# Patient Record
Sex: Female | Born: 1935 | Race: White | Hispanic: No | Marital: Married | Smoking: Never smoker
Health system: Southern US, Community
[De-identification: ages and names within clinical notes are randomized; demographics above are authoritative.]

## PROBLEM LIST (undated history)

## (undated) DIAGNOSIS — E785 Hyperlipidemia, unspecified: Secondary | ICD-10-CM

## (undated) DIAGNOSIS — J189 Pneumonia, unspecified organism: Secondary | ICD-10-CM

## (undated) DIAGNOSIS — I4891 Unspecified atrial fibrillation: Secondary | ICD-10-CM

## (undated) DIAGNOSIS — I350 Nonrheumatic aortic (valve) stenosis: Secondary | ICD-10-CM

## (undated) DIAGNOSIS — K7460 Unspecified cirrhosis of liver: Secondary | ICD-10-CM

## (undated) DIAGNOSIS — C50919 Malignant neoplasm of unspecified site of unspecified female breast: Secondary | ICD-10-CM

## (undated) DIAGNOSIS — C801 Malignant (primary) neoplasm, unspecified: Secondary | ICD-10-CM

## (undated) DIAGNOSIS — D649 Anemia, unspecified: Secondary | ICD-10-CM

## (undated) DIAGNOSIS — K219 Gastro-esophageal reflux disease without esophagitis: Secondary | ICD-10-CM

## (undated) DIAGNOSIS — I10 Essential (primary) hypertension: Secondary | ICD-10-CM

## (undated) DIAGNOSIS — E041 Nontoxic single thyroid nodule: Secondary | ICD-10-CM

## (undated) DIAGNOSIS — N1832 Chronic kidney disease, stage 3b: Secondary | ICD-10-CM

## (undated) DIAGNOSIS — R188 Other ascites: Secondary | ICD-10-CM

## (undated) HISTORY — DX: Unspecified cirrhosis of liver: K74.60

## (undated) HISTORY — PX: COLONOSCOPY: SHX174

## (undated) HISTORY — PX: TUBAL LIGATION: SHX77

## (undated) HISTORY — PX: JOINT REPLACEMENT: SHX530

---

## 2009-05-09 ENCOUNTER — Ambulatory Visit (HOSPITAL_COMMUNITY): Admission: RE | Admit: 2009-05-09 | Discharge: 2009-05-09 | Payer: Self-pay | Admitting: Preventative Medicine

## 2010-10-08 ENCOUNTER — Ambulatory Visit (HOSPITAL_COMMUNITY)
Admission: RE | Admit: 2010-10-08 | Discharge: 2010-10-08 | Payer: Self-pay | Source: Home / Self Care | Attending: Internal Medicine | Admitting: Internal Medicine

## 2010-10-08 ENCOUNTER — Encounter: Payer: Self-pay | Admitting: Orthopedic Surgery

## 2010-10-10 ENCOUNTER — Ambulatory Visit: Payer: Self-pay | Admitting: Orthopedic Surgery

## 2010-10-10 DIAGNOSIS — IMO0002 Reserved for concepts with insufficient information to code with codable children: Secondary | ICD-10-CM | POA: Insufficient documentation

## 2010-10-10 DIAGNOSIS — M1710 Unilateral primary osteoarthritis, unspecified knee: Secondary | ICD-10-CM

## 2010-10-10 DIAGNOSIS — S83289A Other tear of lateral meniscus, current injury, unspecified knee, initial encounter: Secondary | ICD-10-CM | POA: Insufficient documentation

## 2010-10-12 ENCOUNTER — Encounter (INDEPENDENT_AMBULATORY_CARE_PROVIDER_SITE_OTHER): Payer: Self-pay | Admitting: *Deleted

## 2010-10-16 ENCOUNTER — Ambulatory Visit (HOSPITAL_COMMUNITY)
Admission: RE | Admit: 2010-10-16 | Discharge: 2010-10-16 | Payer: Self-pay | Source: Home / Self Care | Attending: Orthopedic Surgery | Admitting: Orthopedic Surgery

## 2010-10-31 ENCOUNTER — Ambulatory Visit
Admission: RE | Admit: 2010-10-31 | Discharge: 2010-10-31 | Payer: Self-pay | Source: Home / Self Care | Attending: Orthopedic Surgery | Admitting: Orthopedic Surgery

## 2010-10-31 ENCOUNTER — Encounter: Payer: Self-pay | Admitting: Orthopedic Surgery

## 2010-11-01 ENCOUNTER — Encounter: Payer: Self-pay | Admitting: Orthopedic Surgery

## 2010-11-05 ENCOUNTER — Telehealth: Payer: Self-pay | Admitting: Orthopedic Surgery

## 2010-11-09 ENCOUNTER — Ambulatory Visit (HOSPITAL_COMMUNITY): Admission: RE | Admit: 2010-11-09 | Payer: Self-pay | Source: Home / Self Care | Admitting: Orthopedic Surgery

## 2010-11-12 ENCOUNTER — Ambulatory Visit: Admit: 2010-11-12 | Payer: Self-pay | Admitting: Orthopedic Surgery

## 2010-11-21 ENCOUNTER — Telehealth: Payer: Self-pay | Admitting: Orthopedic Surgery

## 2010-11-23 ENCOUNTER — Encounter: Payer: Self-pay | Admitting: Orthopedic Surgery

## 2010-11-29 NOTE — Miscellaneous (Signed)
Summary: mri left knee aph 10/16/10 2:30, fu 10/31/10 11am  Clinical Lists Changes   no precert needed for medicare/uhc, pt aware of appts, to bring disc

## 2010-11-29 NOTE — Assessment & Plan Note (Signed)
Summary: [name]/lt knee pain/going for xrays/[employer]/frs   Vital Signs:  Patient profile:   77 year old female Height:      65 inches Weight:      165 pounds Pulse rate:   68 / minute Resp:     16 per minute  Vitals Entered By: Fuller Canada MD (October 10, 2010 2:32 PM)  Visit Type:  new patient Referring Provider:  Dr. Dwana Melena Primary Provider:  Dr. Dwana Melena  CC:  left knee pain.  History of Present Illness: I saw [name] in the office today for an initial visit.  She is a 77 years old woman with the complaint of:  left knee pain.  Xrays left knee APh 10/08/10.  Meds: Losartan and Pravastatin.   This is a 77 year old female, who was in her usual state of health, Saturday, taking a shower, when she got out of the shower and put her LEFT leg on the floor. She couldn't bear weight. Complaint of sharp 9/10 pain of sudden onset associated with swelling of the LEFT knee. Her symptoms have not been relieved with diclofenac 50 mg once or twice a day. Tylenol has not helped as well. She denies any catching, numbness. Primarily, complains of diffuse knee pain, along the joint lines anteriorly and posteriorly with weightbearing  Allergies (verified): 1)  ! Penicillin  Past History:  Past Medical History: htn high cholesterol  Past Surgical History: na  Family History: Family History of Diabetes Family History of Arthritis  Social History: Patient is married.  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 Cardiovascular:  Denies chest pain, palpitations, fainting, and murmurs. Respiratory:  Denies short of breath, wheezing, couch, tightness, pain on inspiration, and snoring . Gastrointestinal:  Denies heartburn, nausea, vomiting, diarrhea, constipation, and blood in your stools. Genitourinary:  Denies frequency, urgency, difficulty  urinating, painful urination, flank pain, and bleeding in urine. Neurologic:  Denies numbness, tingling, unsteady gait, dizziness, tremors, and seizure. Musculoskeletal:  Denies joint pain, swelling, instability, stiffness, redness, heat, and muscle pain. Endocrine:  Denies excessive thirst, exessive urination, and heat or cold intolerance. Psychiatric:  Denies nervousness, depression, anxiety, and hallucinations. Skin:  Denies changes in the skin, poor healing, rash, itching, and redness. HEENT:  Denies blurred or double vision, eye pain, redness, and watering. Immunology:  Denies seasonal allergies, sinus problems, and allergic to bee stings. Hemoatologic:  Denies easy bleeding and brusing.  Physical Exam  Additional Exam:  GEN: well developed, well nourished, normal grooming and hygiene, no deformity and normal body habitus.   CDV: pulses are normal, no edema, no erythema. no tenderness  Lymph: normal lymph nodes   Skin: no rashes, skin lesions or open sores   NEURO: normal coordination, reflexes, sensation.   Psyche: awake, alert and oriented. Mood normal   Gait:she essentially has come contralateral chair.  Chest valgus, bilateral knees.  Her RIGHT knee still flexes 125 motor exam normal. Knee is stable. No tenderness mild swelling.  LEFT knee pain with extension and mild flexion contracture tenderness in the lateral joint line. Small joint effusion. Range of motion 120 knee is stable. I really couldn't do a McMurray sign on her because of pain. Ligaments were normal.    The upper extremities have normal appearance, ROM, strength and stability.    Impression & Recommendations:  Problem # 1:  TEAR LATERAL MENISCUS (ICD-836.1) Assessment New  Orders: New Patient Level IV (75643)  Problem # 2:  KNEE, ARTHRITIS, DEGEN./OSTEO (ICD-715.96) Assessment: New  Difficult case to assess. X-rays were reviewed from the hospital. No acute injury just valgus  osteoarthritis.  Possibilities include locked knee from torn meniscus, acute. Arthritis exacerbation with synovitis, swelling, and inflammation.  Recommend MRI to assess lateral meniscal tear.  Patient placed in hinged knee brace.  Walker was ordered for weightbearing.  Orders: New Patient Level IV (32951)  Patient Instructions: 1)  BRACE  2)  WALKER  3)  MRI LEFT KNEE WILL RETURN AFTER STUDIES COMPLETED    Orders Added: 1)  New Patient Level IV [99204]  Appended Document: [name]/lt knee pain/going for xrays/[employer]/frs GEN: well developed, well nourished, normal grooming and hygiene, no deformity and normal body habitus.   CDV: pulses are normal, no edema, no erythema. no tenderness  Lymph: normal lymph nodes   Skin: no rashes, skin lesions or open sores   NEURO: normal coordination, reflexes, sensation.   Psyche: awake, alert and oriented. Mood normal   Gait:she essentially has come contralateral chair.  Chest valgus, bilateral knees.  Her RIGHT knee still flexes 125 motor exam normal. Knee is stable. No tenderness mild swelling.  LEFT knee pain with extension and mild flexion contracture tenderness in the lateral joint line. Small joint effusion. Range of motion 120 knee is stable. I really couldn't do a McMurray sign on her because of pain. Ligaments were normal.    The upper extremities have normal appearance, ROM, strength and stability.

## 2010-11-29 NOTE — Letter (Signed)
Summary: Surgery order LT knee sched 11/09/10  Surgery order LT knee sched 11/09/10   Imported By: Cammie Sickle 11/02/2010 09:21:25  _____________________________________________________________________  External Attachment:    Type:   Image     Comment:   External Document

## 2010-11-29 NOTE — Assessment & Plan Note (Signed)
Summary: left knee mri results aph/to bring disc.cbt   Visit Type:  Follow-up Referring Provider:  Dr. Dwana Melena Primary Provider:  Dr. Dwana Melena  CC:  mri results left knee.  History of Present Illness: I saw [name] in the office today for a visit.  She is a 77 years old woman with the complaint of:  left knee pain.  Xrays left knee APh 10/08/10.  Meds: Losartan and Pravastatin.   DX: KNEE SPRAIN   Today is MRI results of the left knee taken [facility] 10/16/10.  Placed in economy hinge knee brace and advised to use walker last visit, doing alot better.  No injections.  IMPRESSION:   1.  Advanced lateral compartment degenerative changes with extensive degenerative tear of the anterior horn and body of the lateral meniscus. 2.  Milder medial and patellofemoral degenerative changes.  No acute osteochondral findings. 3.  Intact medial meniscus, collateral and cruciate ligaments.  we discussed the following total knee replacement versus arthroscopy versus continued nonoperative treatment. Her husband was present. She indicates that she cannot function the way her knee is at this time. She is having pain when she is ambulating although not severe. I told he leaning against knee replacement, although I thought it would be necessary in the future.    Allergies: 1)  ! Penicillin   Impression & Recommendations:  Problem # 1:  KNEE, ARTHRITIS, DEGEN./OSTEO (ZOX-096.04)  Orders: Est. Patient Level IV (54098)  Problem # 2:  TEAR LATERAL MENISCUS (ICD-836.1)  Orders: Est. Patient Level IV (11914)  Patient Instructions: 1)  SCHEDULED FOR LEFT KNEE SURGERY ARTHROSCOPY AND LATERAL MENISECTOMY 2)  DOS 11/09/10 3)  Preop is 11/05/10 at 12:30, take packet with you. 4)  Stop taking Aspirin Friday 11/02/10. 5)  Post op 1 in our office 11/12/09.   Orders Added: 1)  Est. Patient Level IV [78295]  Appended Document: left knee mri results aph/to bring disc.cbt Allergies (verified): 1)   ! Penicillin  Past History:  Past Medical History: htn high cholesterol  Past Surgical History: na  Family History: Family History of Diabetes Family History of Arthritis  Social History: Patient is married.  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 Cardiovascular:  Denies chest pain, palpitations, fainting, and murmurs. Respiratory:  Denies short of breath, wheezing, couch, tightness, pain on inspiration, and snoring . Gastrointestinal:  Denies heartburn, nausea, vomiting, diarrhea, constipation, and blood in your stools. Genitourinary:  Denies frequency, urgency, difficulty urinating, painful urination, flank pain, and bleeding in urine. Neurologic:  Denies numbness, tingling, unsteady gait, dizziness, tremors, and seizure. Musculoskeletal:  Denies joint pain, swelling, instability, stiffness, redness, heat, and muscle pain. Endocrine:  Denies excessive thirst, exessive urination, and heat or cold intolerance. Psychiatric:  Denies nervousness, depression, anxiety, and hallucinations. Skin:  Denies changes in the skin, poor healing, rash, itching, and redness. HEENT:  Denies blurred or double vision, eye pain, redness, and watering. Immunology:  Denies seasonal allergies, sinus problems, and allergic to bee stings. Hemoatologic:  Denies easy bleeding and brusing.  Appended Document: left knee mri results aph/to bring disc.cbt GEN: well developed, well nourished, normal grooming and hygiene, no deformity and normal body habitus.   CDV: pulses are normal, no edema, no erythema. no tenderness  Lymph: normal lymph nodes   Skin: no rashes, skin lesions or open sores   NEURO: normal coordination, reflexes, sensation.  Psyche: awake, alert and oriented. Mood normal   Gait:she essentially has come contralateral chair.  Chest valgus,  bilateral knees.  Her RIGHT knee still flexes 125 motor exam normal. Knee is stable. No tenderness mild swelling.  LEFT knee pain with extension and mild flexion contracture tenderness in the lateral joint line. Small joint effusion. Range of motion 120 knee is stable. I really couldn't do a McMurray sign on her because of pain. Ligaments were normal.    The upper extremities have normal appearance, ROM, strength and stability.

## 2010-11-29 NOTE — Progress Notes (Signed)
Summary: wants to cancel surgery  Phone Note Other Incoming   Summary of Call: [name] called for [name] (January 20, 2030).  Said she wanted to cancel her surgery that is scheduled for 11/09/10.  I will call [name] to cancel her preop appointment.  [name]'s phone # is [phone]  Initial call taken by: Jacklynn Ganong,  November 05, 2010 8:29 AM  Follow-up for Phone Call        called kim and left message to cx surgery and preop  Follow-up by: Ether Griffins,  November 05, 2010 8:38 AM

## 2010-11-29 NOTE — Letter (Signed)
Summary: Physician's order for a cane  Physician's order for a cane   Imported By: Jacklynn Ganong 11/01/2010 15:42:02  _____________________________________________________________________  External Attachment:    Type:   Image     Comment:   External Document

## 2010-11-29 NOTE — Progress Notes (Signed)
Summary: call from patient, requesting records  Phone Note Call from Patient   Caller: Patient Summary of Call: Patient called to relay that she is to have a 2nd opinion regarding her LT knee. Requested records to be faxed to Dr Claudie Leach.  Advised to come in to sign authorization release of information form and notes can be faxed with signed release. Initial call taken by: Cammie Sickle,  November 21, 2010 2:12 PM

## 2010-11-29 NOTE — Letter (Signed)
Summary: History form  History form   Imported By: Jacklynn Ganong 10/15/2010 13:58:02  _____________________________________________________________________  External Attachment:    Type:   Image     Comment:   External Document

## 2010-11-29 NOTE — Letter (Signed)
Summary: Medical records faxed pt request [facility]  Medical records faxed pt request [facility]   Imported By: Cammie Sickle 11/23/2010 21:11:50  _____________________________________________________________________  External Attachment:    Type:   Image     Comment:   External Document

## 2011-06-10 ENCOUNTER — Ambulatory Visit (HOSPITAL_COMMUNITY)
Admission: RE | Admit: 2011-06-10 | Discharge: 2011-06-10 | Disposition: A | Discharge status: Home / Self Care | Payer: Medicare Other | Source: Ambulatory Visit | Attending: Orthopedic Surgery | Admitting: Orthopedic Surgery

## 2011-06-10 ENCOUNTER — Other Ambulatory Visit: Payer: Self-pay | Admitting: Orthopedic Surgery

## 2011-06-10 ENCOUNTER — Encounter (HOSPITAL_COMMUNITY): Payer: Medicare Other

## 2011-06-10 ENCOUNTER — Other Ambulatory Visit (HOSPITAL_COMMUNITY): Payer: Self-pay | Admitting: Orthopedic Surgery

## 2011-06-10 DIAGNOSIS — Z01810 Encounter for preprocedural cardiovascular examination: Secondary | ICD-10-CM | POA: Insufficient documentation

## 2011-06-10 DIAGNOSIS — Z01818 Encounter for other preprocedural examination: Secondary | ICD-10-CM | POA: Insufficient documentation

## 2011-06-10 DIAGNOSIS — I10 Essential (primary) hypertension: Secondary | ICD-10-CM | POA: Insufficient documentation

## 2011-06-10 DIAGNOSIS — M1710 Unilateral primary osteoarthritis, unspecified knee: Secondary | ICD-10-CM

## 2011-06-10 DIAGNOSIS — M179 Osteoarthritis of knee, unspecified: Secondary | ICD-10-CM

## 2011-06-10 DIAGNOSIS — Z01812 Encounter for preprocedural laboratory examination: Secondary | ICD-10-CM | POA: Insufficient documentation

## 2011-06-10 LAB — URINALYSIS, ROUTINE W REFLEX MICROSCOPIC
Glucose, UA: NEGATIVE mg/dL
Protein, ur: NEGATIVE mg/dL
pH: 6.5 (ref 5.0–8.0)

## 2011-06-10 LAB — COMPREHENSIVE METABOLIC PANEL
ALT: 17 U/L (ref 0–35)
AST: 18 U/L (ref 0–37)
Albumin: 4.1 g/dL (ref 3.5–5.2)
Alkaline Phosphatase: 85 U/L (ref 39–117)
Chloride: 97 mEq/L (ref 96–112)
Potassium: 4.2 mEq/L (ref 3.5–5.1)
Sodium: 133 mEq/L — ABNORMAL LOW (ref 135–145)
Total Bilirubin: 0.3 mg/dL (ref 0.3–1.2)
Total Protein: 8 g/dL (ref 6.0–8.3)

## 2011-06-10 LAB — CBC
MCHC: 33.8 g/dL (ref 30.0–36.0)
Platelets: 368 10*3/uL (ref 150–400)
RDW: 14.3 % (ref 11.5–15.5)
WBC: 8.4 10*3/uL (ref 4.0–10.5)

## 2011-06-10 LAB — SURGICAL PCR SCREEN
MRSA, PCR: NEGATIVE
Staphylococcus aureus: POSITIVE — AB

## 2011-06-10 LAB — PROTIME-INR: Prothrombin Time: 13.5 seconds (ref 11.6–15.2)

## 2011-06-10 LAB — URINE MICROSCOPIC-ADD ON

## 2011-06-21 ENCOUNTER — Inpatient Hospital Stay (HOSPITAL_COMMUNITY)
Admission: RE | Admit: 2011-06-21 | Discharge: 2011-06-24 | DRG: 470 | Disposition: A | Discharge status: Home Health | Payer: Medicare Other | Source: Ambulatory Visit | Attending: Orthopedic Surgery | Admitting: Orthopedic Surgery

## 2011-06-21 DIAGNOSIS — M21869 Other specified acquired deformities of unspecified lower leg: Secondary | ICD-10-CM | POA: Diagnosis present

## 2011-06-21 DIAGNOSIS — Z01812 Encounter for preprocedural laboratory examination: Secondary | ICD-10-CM

## 2011-06-21 DIAGNOSIS — I10 Essential (primary) hypertension: Secondary | ICD-10-CM | POA: Diagnosis present

## 2011-06-21 DIAGNOSIS — E871 Hypo-osmolality and hyponatremia: Secondary | ICD-10-CM | POA: Diagnosis not present

## 2011-06-21 DIAGNOSIS — M1710 Unilateral primary osteoarthritis, unspecified knee: Principal | ICD-10-CM | POA: Diagnosis present

## 2011-06-21 DIAGNOSIS — E7800 Pure hypercholesterolemia, unspecified: Secondary | ICD-10-CM | POA: Diagnosis present

## 2011-06-21 LAB — TYPE AND SCREEN
ABO/RH(D): O POS
Antibody Screen: NEGATIVE

## 2011-06-21 LAB — ABO/RH: ABO/RH(D): O POS

## 2011-06-22 LAB — BASIC METABOLIC PANEL
Calcium: 8.5 mg/dL (ref 8.4–10.5)
GFR calc Af Amer: >60 mL/min (ref >60)
GFR calc non Af Amer: >60 mL/min (ref >60)
Glucose, Bld: 139 mg/dL — ABNORMAL HIGH (ref 70–99)
Sodium: 129 mEq/L — ABNORMAL LOW (ref 135–145)

## 2011-06-22 LAB — CBC
Hemoglobin: 9.4 g/dL — ABNORMAL LOW (ref 12.0–15.0)
MCH: 29.4 pg (ref 26.0–34.0)
MCHC: 33.8 g/dL (ref 30.0–36.0)
RDW: 14.1 % (ref 11.5–15.5)

## 2011-06-23 LAB — BASIC METABOLIC PANEL
Calcium: 8.7 mg/dL (ref 8.4–10.5)
GFR calc non Af Amer: >60 mL/min (ref >60)
Potassium: 3.8 mEq/L (ref 3.5–5.1)
Sodium: 126 mEq/L — ABNORMAL LOW (ref 135–145)

## 2011-06-23 LAB — CBC
MCH: 29.8 pg (ref 26.0–34.0)
MCHC: 34.7 g/dL (ref 30.0–36.0)
Platelets: 306 10*3/uL (ref 150–400)
RBC: 3.22 MIL/uL — ABNORMAL LOW (ref 3.87–5.11)

## 2011-06-24 LAB — CBC
HCT: 24.7 % — ABNORMAL LOW (ref 36.0–46.0)
Hemoglobin: 8.5 g/dL — ABNORMAL LOW (ref 12.0–15.0)
MCH: 29.5 pg (ref 26.0–34.0)
MCHC: 34.4 g/dL (ref 30.0–36.0)
MCV: 85.8 fL (ref 78.0–100.0)
Platelets: 307 10*3/uL (ref 150–400)
RBC: 2.88 MIL/uL — ABNORMAL LOW (ref 3.87–5.11)
RDW: 14.3 % (ref 11.5–15.5)
WBC: 8.9 10*3/uL (ref 4.0–10.5)

## 2011-06-24 LAB — BASIC METABOLIC PANEL
CO2: 26 mEq/L (ref 19–32)
Chloride: 94 mEq/L — ABNORMAL LOW (ref 96–112)
Potassium: 3.9 mEq/L (ref 3.5–5.1)
Sodium: 128 mEq/L — ABNORMAL LOW (ref 135–145)

## 2011-06-27 NOTE — Op Note (Signed)
[name], [name] NO.:  [id number]  MEDICAL RECORD NO.:  [id number]  LOCATION:  1601                         FACILITY:  [facility]  PHYSICIAN:  Ollen Gross, M.D.    DATE OF BIRTH:  [birth year]  DATE OF PROCEDURE:  06/21/2011 DATE OF DISCHARGE:                              OPERATIVE REPORT   PREOPERATIVE DIAGNOSIS:  Osteoarthritis left knee with valgus deformity.  POSTOPERATIVE DIAGNOSIS:  Osteoarthritis left knee with valgus deformity.  PROCEDURE:  Left total knee arthroplasty.  SURGEON:  Ollen Gross, M.D.  ASSISTANT:  Alexzandrew L. Perkins, P.A.C.  ANESTHESIA:  Spinal.  ESTIMATED BLOOD LOSS:  Minimal.  DRAIN:  Hemovac x1.  TOURNIQUET TIME:  28 minutes at 300 mmHg.  COMPLICATIONS:  None.  CONDITION:  Stable to recovery room.  BRIEF CLINICAL NOTE:  [name] is a 77 year old female with severe advanced end-stage arthritis of the left knee with about a 20 degrees valgus deformity.  She is bone-on-bone in lateral and patellofemoral compartments.  She has failed nonoperative management including injections.  She has intractable pain and dysfunction.  She presents now for left total knee arthroplasty.  PROCEDURE IN DETAIL:  After successful administration of spinal anesthetic, a tourniquet was placed on her left thigh and her left lower extremity, was prepped and draped in the usual sterile fashion.  The extremity was wrapped in Esmarch, knee flexed, tourniquet inflated to 300 mmHg.  Midline incision was made with #10 blade through subcutaneous tissue to the level of the extensor mechanism.  Fresh blade was used to make a lateral parapatellar arthrotomy.  We did the lateral approach due to her valgus deformity.  Soft tissue on the proximal lateral tibia was subperiosteally elevated around the joint line to the posterolateral corner, but not including the structures of the posterolateral corner. The patella was everted medially, knee flexed to 90  degrees, ACL and PCL removed.  Drill was used create a starting hole in the distal femur, the canal was thoroughly irrigated.  Five degrees left valgus alignment guide was placed.  Distal femoral cutting block was pinned to remove 11 mm off the distal femur.  The resection was made with an oscillating saw.  Sizing guide was placed, size 3 was the most appropriate. Rotation was marked of the epicondylar axis.  Size 3 cutting block was placed and the anterior-posterior chamfer cuts were made.  The tibia subluxed forward and menisci were removed.  Extramedullary tibial alignment guide was placed referencing proximally at the medial aspect of the tibial tubercle and distally along the second metatarsal axis of the tibial crest.  The block was pinned to remove 2 mm off the more deficient lateral side.  Size 3 was the most appropriate tibial component and proximal tibia was prepared to modular drill and keel punch for the size 3.  Femoral preparation was completed with intercondylar cut.  Size 3 posterior stabilized femoral trial was placed.  A 12.5 mm posterior stabilized rotating platform insert trial was placed.  With a 12.5, full extension was achieved with excellent varus-valgus and anterior-posterior balance throughout full range of motion.  The patella was everted and thickness measured to be 24 mm.  Freehand resection taken to 14 mm, 35 template was placed, lug holes drilled, trial patella was placed and it tracked normally.  Osteophytes were removed off the posterior femur with the trial in place.  All trial was removed and the cut bone surfaces prepared with pulsatile lavage. Cement was mixed and once ready for implantation, a size 3 mobile bearing tibial tray size 3 posterior stabilized femur and 35 patella were cemented in place.  Patella was held with the clamp.  Trial 12.5-mm insert was placed, knee held in full extension, all extruded cement removed.  When cement had fully  hardened, then the permanent 12.5-mm posterior stabilized rotating platform insert was placed in the tibial tray.  The wound was copiously irrigated with saline solution and tourniquet was released for a total time of 28 minutes.  Minor bleeding was identified and stopped with cautery.  The arthrotomy was then closed with #1 PDS leaving up a small area from the superior to inferior pole of the patella to serve as a mini release.  The subcu was then closed with interrupted 2-0 Vicryl.  Flexion against gravity was 140 degrees. The patella tracked normally.  The subcuticular was closed with running 4-0 Monocryl.  Catheter for Marcaine pain pump was placed and pumps initiated.  The incision was cleaned and dried and Steri-Strips and bulky sterile dressing were applied.  She was then placed into a knee immobilizer, awakened, and transported to recovery in stable condition.     Ollen Gross, M.D.     FA/MEDQ  D:  06/21/2011  T:  06/21/2011  Job:  [id number]  Electronically Signed by Ollen Gross M.D. on 06/27/2011 04:05:08 PM

## 2011-06-27 NOTE — H&P (Signed)
[name], Adrienne NO.:  [id number]  MEDICAL RECORD NO.:  [id number]  LOCATION:  1601                         FACILITY:  [facility]  PHYSICIAN:  Ollen Gross, M.D.    DATE OF BIRTH:  11-Jul-1930  DATE OF ADMISSION:  06/21/2011 DATE OF DISCHARGE:                             HISTORY & PHYSICAL   CHIEF COMPLAINT:  Left knee pain.  HISTORY OF PRESENT ILLNESS:  The patient is a 77 year old female who has been seen by Dr. Lequita Halt for ongoing left knee pain.  Her pain has been quite severe now where it is severely limiting her function.  She has been seen in the second opinion for the ongoing knee problems, it is limiting her activity.  She has been seen and found to have advanced end- stage arthritis of the left knee with bone-on-bone in the lateral compartment and the patellofemoral compartment.  She also has a large valgus deformity.  It is felt she would benefit from surgical intervention, risks and benefits have been discussed, she elected to proceed with surgery.  ALLERGIES:  PENICILLIN causes rash.  CURRENT MEDICATIONS:  Aloe tea, aspirin, vitamin D, fish oil, vitamin E, multivitamin, glucosamine and chondroitin, simvastatin, losartan.  PAST MEDICAL HISTORY: 1. Past history of pneumonia. 2. Hypertension. 3. Hypercholesterolemia.  PAST SURGICAL HISTORY:  Colonoscopy.  FAMILY HISTORY:  Father with a history of acute bronchitis.  Mother with heart attack.  SOCIAL HISTORY:  Married, nonsmoker, no alcohol.  Two children.  She prefers to go home following her surgery.  Husband will be assisting with care after surgery.  She has 4-1/2 steps going into the house.  REVIEW OF SYSTEMS:  GENERAL:  No fever, chills, or night sweats.  NEURO: No seizure, syncope, or paralysis.  RESPIRATORY:  No shortness of breath, productive cough, or hemoptysis.  CARDIOVASCULAR:  No chest pain, angina, orthopnea.  GI:  No nausea, vomiting, diarrhea, or constipation.  GU:  No  dysuria, hematuria, or discharge. MUSCULOSKELETAL:  Knee pain.  PHYSICAL EXAMINATION:  VITAL SIGNS:  Pulse 92, respirations 14, blood pressure 179/87. GENERAL:  A 77 year old white female, well nourished, well developed, in no acute distress.  She is alert, oriented, cooperative, pleasant. Accompanied by her husband.  Good historian. HEENT:  Normocephalic, atraumatic.  Pupils are round and reactive.  EOMs intact. NECK:  Supple. CHEST:  Clear. HEART:  Regular rate and rhythm with a grade 2/6 systolic ejection murmur, best heard over aortic, pulmonic, and Erb's point. ABDOMEN:  Soft, slightly round.  Bowel sounds present, protuberant. RECTAL/BREASTS/GENITALIA:  Not done, not pertinent to present illness. EXTREMITIES:  Left knee significant valgus malalignment deformity, range of motion 5-120, marked crepitus, tender more lateral than medial, no instability, does ambulate with antalgic gait.  IMPRESSION:  Osteoarthritis, left knee.  PLAN:  The patient admitted to [facility] to undergo a left total knee replacement arthroplasty.  Surgery will be performed by Dr. Ollen Gross.  She has no active contraindications such as ongoing infection or rapidly progressive neurological disease.     Alexzandrew L. Julien Girt, P.A.C.   ______________________________ Ollen Gross, M.D.    ALP/MEDQ  D:  06/23/2011  T:  06/24/2011  Job:  [id number]  cc:   Dr. Stephens Shire  Electronically Signed by Patrica Duel P.A.C. on 06/24/2011 04:09:18 PM Electronically Signed by Ollen Gross M.D. on 06/27/2011 04:05:11 PM

## 2011-07-17 NOTE — Discharge Summary (Signed)
[name], [name] NO.:  [id number]  MEDICAL RECORD NO.:  [id number]  LOCATION:  1601                         FACILITY:  [facility]  PHYSICIAN:  Ollen Gross, M.D.    DATE OF BIRTH:  February 22, 1930  DATE OF ADMISSION:  06/21/2011 DATE OF DISCHARGE:  06/24/2011                              DISCHARGE SUMMARY   ADMITTING DIAGNOSES: 1. Osteoarthritis, left knee. 2. Past history of pneumonia. 3. Hypertension. 4. Hypercholesterolemia.  DISCHARGE DIAGNOSES: 1. Osteoarthritis left knee with valgus deformity, status post left     total knee replacement arthroplasty. 2. Postop acute blood loss anemia. 3. Postop hyponatremia. 4. Past history of pneumonia. 5. Hypertension. 6. Hypercholesterolemia.  PROCEDURE:  June 21, 2011, left total knee.  Surgeon, Dr. Lequita Halt. Assistant, Patrica Duel, PA-C.  Spinal anesthesia.  Tourniquet time 28 minutes.  CONSULTS:  None.  BRIEF HISTORY:  The patient is a 77 year old female with severe end- stage arthritis of the left knee with about 20 degrees valgus malalignment deformity with bone on bone lateral patellofemoral compartments.  She has failed nonoperative management including injections.  Has intractable pain and dysfunction, now presents for total knee arthroplasty.  LABORATORY DATA:  Preop CBC is not scanned into the chart, not available; the hemoglobin dropped postop in the 9.4, got as low as 8.5; and 24.7.  Chem panel not scanned in and not available, but the sodium was low postop at 129, dropped down to 126, back up to 128 was improving at time of discharge.  Glucose was elevated on day #1 of 139, back down to 113.  Blood group type O positive.  HOSPITAL COURSE:  The patient was admitted to [facility], taken to OR, underwent above-stated procedure without complication.  The patient tolerated the procedure well, later transferred to recovery room and then to the orthopedic floor.  Started on p.o. and  IV analgesic for pain control.  Following surgery, given 24 hours postop IV antibiotics. Started on Xarelto for DVT prophylaxis.  Doing fairly well on the morning of day #1, was comfortable.  Discontinued the Hemovac and PCA. Started getting up with therapy.  On day #2, she was doing very well. Sodium was down to 126, so we stopped all the fluids and rechecked it. Incision was healing well, no signs of infection.  Continued to work with therapy and started meeting her goals and by day #3, she was doing well, sodium was back up a little bit to 128, progressing with therapy. Incision looked good.  Tolerating medications and discharged home.  DISCHARGE PLAN: 1. The patient was discharged home on June 24, 2011. 2. Discharge diagnoses, please see above. 3. Discharge medications, Vicodin, Robaxin, Xarelto.  Continue home     medications of cetirizine, losartan, and simvastatin. 4. Follow up in 2 weeks. 5. Activity, weightbearing as tolerated, total knee protocol.  DISPOSITION:  Home.  CONDITION UPON DISCHARGE:  Improved.     Alexzandrew L. Julien Girt, P.A.C.   ______________________________ Ollen Gross, M.D.    ALP/MEDQ  D:  07/11/2011  T:  07/11/2011  Job:  [id number]  cc:   Dr. Stephens Shire  Electronically Signed by Ollen Gross M.D. on  07/17/2011 10:07:44 AM

## 2011-09-10 ENCOUNTER — Other Ambulatory Visit: Payer: Self-pay | Admitting: Orthopedic Surgery

## 2011-11-18 ENCOUNTER — Other Ambulatory Visit: Payer: Self-pay | Admitting: Orthopedic Surgery

## 2011-12-02 ENCOUNTER — Encounter (HOSPITAL_COMMUNITY): Payer: Self-pay

## 2011-12-05 ENCOUNTER — Other Ambulatory Visit: Payer: Self-pay

## 2011-12-05 ENCOUNTER — Ambulatory Visit (HOSPITAL_COMMUNITY)
Admission: RE | Admit: 2011-12-05 | Discharge: 2011-12-05 | Disposition: A | Discharge status: Home / Self Care | Payer: Medicare Other | Source: Ambulatory Visit | Attending: Orthopedic Surgery | Admitting: Orthopedic Surgery

## 2011-12-05 ENCOUNTER — Encounter (HOSPITAL_COMMUNITY): Payer: Self-pay

## 2011-12-05 ENCOUNTER — Encounter (HOSPITAL_COMMUNITY)
Admission: RE | Admit: 2011-12-05 | Discharge: 2011-12-05 | Disposition: A | Discharge status: Home / Self Care | Payer: Medicare Other | Source: Ambulatory Visit | Attending: Orthopedic Surgery | Admitting: Orthopedic Surgery

## 2011-12-05 DIAGNOSIS — Z01810 Encounter for preprocedural cardiovascular examination: Secondary | ICD-10-CM | POA: Insufficient documentation

## 2011-12-05 DIAGNOSIS — I10 Essential (primary) hypertension: Secondary | ICD-10-CM | POA: Insufficient documentation

## 2011-12-05 DIAGNOSIS — Z01818 Encounter for other preprocedural examination: Secondary | ICD-10-CM | POA: Insufficient documentation

## 2011-12-05 DIAGNOSIS — Z01812 Encounter for preprocedural laboratory examination: Secondary | ICD-10-CM | POA: Insufficient documentation

## 2011-12-05 HISTORY — DX: Hyperlipidemia, unspecified: E78.5

## 2011-12-05 HISTORY — DX: Gastro-esophageal reflux disease without esophagitis: K21.9

## 2011-12-05 HISTORY — DX: Nontoxic single thyroid nodule: E04.1

## 2011-12-05 HISTORY — DX: Essential (primary) hypertension: I10

## 2011-12-05 HISTORY — DX: Pneumonia, unspecified organism: J18.9

## 2011-12-05 LAB — COMPREHENSIVE METABOLIC PANEL
BUN: 23 mg/dL (ref 6–23)
Calcium: 9.9 mg/dL (ref 8.4–10.5)
Creatinine, Ser: 0.92 mg/dL (ref 0.50–1.10)
GFR calc Af Amer: 67 mL/min — ABNORMAL LOW (ref >90)
Glucose, Bld: 94 mg/dL (ref 70–99)
Total Protein: 7.8 g/dL (ref 6.0–8.3)

## 2011-12-05 LAB — URINE MICROSCOPIC-ADD ON

## 2011-12-05 LAB — CBC
HCT: 35.1 % — ABNORMAL LOW (ref 36.0–46.0)
Hemoglobin: 11.8 g/dL — ABNORMAL LOW (ref 12.0–15.0)
MCH: 29 pg (ref 26.0–34.0)
MCHC: 33.6 g/dL (ref 30.0–36.0)

## 2011-12-05 LAB — PROTIME-INR
INR: 1.02 (ref 0.00–1.49)
Prothrombin Time: 13.6 seconds (ref 11.6–15.2)

## 2011-12-05 LAB — URINALYSIS, ROUTINE W REFLEX MICROSCOPIC
Protein, ur: NEGATIVE mg/dL
Urobilinogen, UA: 0.2 mg/dL (ref 0.0–1.0)

## 2011-12-05 LAB — SURGICAL PCR SCREEN: MRSA, PCR: NEGATIVE

## 2011-12-05 MED ORDER — CHLORHEXIDINE GLUCONATE 4 % EX LIQD
60.0000 mL | Freq: Once | CUTANEOUS | Status: DC
  Filled 2011-12-05: qty 60

## 2011-12-05 NOTE — Patient Instructions (Signed)
20 [name]  12/05/2011   Your procedure is scheduled on:  12/16/11   Surgery  [id number]    Monday  Report to [facility] at     0555  AM.  Call this number if you have problems the morning of surgery: [phone]     Or PST   [id number]  [facility]   Remember:   Do not eat food:After Midnight. Sunday night  May have clequids:Clear liquids until midnight Sunday night then none  Clear liquids include soda, tea, black coffee, apple or grape juice, broth.  Take these medicines the morning of surgery with A SIP OF WATER: Zyrtec if needed   Do not wear jewelry, make-up or nail polish.  Do not wear lotions, powders, or perfumes. You may wear deodorant.  Do not shave 48 hours prior to surgery.  Do not bring valuables to the hospital.  Contacts, dentures or bridgework may not be worn into surgery.  Leave suitcase in the car. After surgery it may be brought to your room.  For patients admitted to the hospital, checkout time is 11:00 AM the day of discharge.   Patients discharged the day of surgery will not be allowed to drive home.  Name and phone number of your driver:    RON                                                                 Special Instructions: CHG Shower Use Special Wash: 1/2 bottle night before surgery and 1/2 bottle morning of surgery. REOF GULAR SOAP FACE AND PRIVATES              LADIES- NO SHAVING 48 HOURS BEFORE USING BETASEPT SOAP.                 Read over the following fact sheets that you were given: MRSA        Blood Transfusion

## 2011-12-05 NOTE — Pre-Procedure Instructions (Signed)
Notified [name] at Dr Salina April of abnormal urine report [id number]

## 2011-12-05 NOTE — Pre-Procedure Instructions (Signed)
Faxed clarification of antibiotic order and allergies to Dr Aluisio-Received confirmation from him "ANCEF OK"

## 2011-12-13 ENCOUNTER — Other Ambulatory Visit: Payer: Self-pay | Admitting: Orthopedic Surgery

## 2011-12-13 NOTE — H&P (Signed)
[name]  DOB: 08-01-1930 Married / Language: English / Race: White / Female  Date of Admission:  12/16/2011  Chief Complaint:  Right Knee pain  History of Present Illness The patient is a 78 year old female who comes in today for a preoperative History and Physical. The patient is scheduled for a right total knee arthroplasty to be performed by Dr. Gus Rankin. Aluisio, MD at [facility] on 12/16/2011. 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 [name] feels that her left knee is doing fantastic. Right knee is the only thing that is bothering her badly now. She has significant valgus deformity in the right knee with bone on bone changes. She feels like the left knee has recovered beautifully and is ready to do something about the right knee. They have been treated conservatively in the past for the above stated problem and despite conservative measures, they continue to have progressive pain and severe functional limitations and dysfunction. They have failed non-operative management including home exercise, medications, and injections. It is felt that they would benefit from undergoing total joint replacement. Risks and benefits of the procedure have been discussed with the patient and they elect to proceed with surgery. There are no active contraindications to surgery such as ongoing infection or rapidly progressive neurological disease.  Problem List/Past Medical Hypercholesterolemia History of Pneumonia Hypertension   Allergies PENICILLIN - Rash.   Family History Father. Deceased. Age 77, History of Brochitis Mother. Deceased, Myocardial infarction. Age 27   Social History No alcohol use Tobacco use. Former smoker. never smoker Marital status. married Living situation. live with spouse Children. 2 Alcohol use. never consumed alcohol Post-Surgical Plans. Plan to go home. Advance Directives. Living Will   Medication  History Simvastatin (40MG  Tablet, Oral) Active. Losartan Potassium (100MG  Tablet, Oral) Active.   Past Surgical History Tubal Ligation Colonoscopy   Review of Systems General:Not Present- Chills, Fever, Night Sweats, Fatigue, Weight Gain, Weight Loss and Memory Loss. Skin:Not Present- Hives, Itching, Rash, Eczema and Lesions. HEENT:Not Present- Tinnitus, Headache, Double Vision, Visual Loss, Hearing Loss and Dentures. Respiratory:Not Present- Shortness of breath with exertion, Shortness of breath at rest, Allergies, Coughing up blood and Chronic Cough. Cardiovascular:Not Present- Chest Pain, Racing/skipping heartbeats, Difficulty Breathing Lying Down, Murmur, Swelling and Palpitations. Gastrointestinal:Not Present- Bloody Stool, Heartburn, Abdominal Pain, Vomiting, Nausea, Constipation, Diarrhea, Difficulty Swallowing, Jaundice and Loss of appetitie. Female Genitourinary:Not Present- Blood in Urine, Urinary frequency, Weak urinary stream, Discharge, Flank Pain, Incontinence, Painful Urination, Urgency, Urinary Retention and Urinating at Night. Musculoskeletal:Not Present- Muscle Weakness, Muscle Pain, Joint Swelling, Joint Pain, Back Pain, Morning Stiffness and Spasms. Neurological:Not Present- Tremor, Dizziness, Blackout spells, Paralysis, Difficulty with balance and Weakness. Psychiatric:Not Present- Insomnia.   Vitals Weight: 158 lb Height: 64 in Body Surface Area: 1.8 m Body Mass Index: 27.12 kg/m Pulse: 78 (Regular) Resp.: 14 (Unlabored) BP: 142/78 (Sitting, Left Arm, Standard)    Physical Exam The physical exam findings are as follows: Patient is a 78 year old female with conitnued knee pain. Patient is accompanied today by her husband.   General Mental Status - Alert, cooperative and good historian. General Appearance- pleasant. Not in acute distress. Orientation- Oriented X3. Build & Nutrition- Well nourished and Well  developed.   Head and Neck Head- normocephalic, atraumatic . Neck Global Assessment- supple. no bruit auscultated on the right and no bruit auscultated on the left.   Eye Pupil- Bilateral- round and reactive. Motion- Bilateral- EOMI.   Chest and Lung Exam  Auscultation: Breath sounds:- clear at anterior chest wall and - clear at posterior chest wall. Adventitious sounds:- No Adventitious sounds.   Cardiovascular Auscultation:Rhythm- Regular rate and rhythm. Heart Sounds- S1 WNL and S2 WNL. Murmurs & Other Heart Sounds:Auscultation of the heart reveals - No Murmurs.   Abdomen Palpation/Percussion:Tenderness- Abdomen is non-tender to palpation. Rigidity (guarding)- Abdomen is soft. Auscultation:Auscultation of the abdomen reveals - Bowel sounds normal.   Female Genitourinary Not done, not pertinent to present illness  Musculoskeletal Right knee with moderate crepitus, valgus deformity, tender lateral greater than medial joint line.  Assessment & Plan Osteoarthrosis Right Knee  Note: Patient is for a Right Total Knee Arthroplasty by Dr. Lequita Halt.  Plan is to go home after the surgery.  PCP - Dr. Dwana Melena - Patient has been seen by Dr. Margo Aye preoperatively and felt to be stable for surgery.  Avel Peace, PA-C

## 2011-12-16 ENCOUNTER — Encounter (HOSPITAL_COMMUNITY)
Admission: RE | Disposition: A | Discharge status: Home Health | Payer: Self-pay | Source: Ambulatory Visit | Attending: Orthopedic Surgery

## 2011-12-16 ENCOUNTER — Inpatient Hospital Stay (HOSPITAL_COMMUNITY)
Admission: RE | Admit: 2011-12-16 | Discharge: 2011-12-19 | DRG: 470 | Disposition: A | Discharge status: Home Health | Payer: Medicare Other | Source: Ambulatory Visit | Attending: Orthopedic Surgery | Admitting: Orthopedic Surgery

## 2011-12-16 ENCOUNTER — Inpatient Hospital Stay (HOSPITAL_COMMUNITY): Payer: Medicare Other | Admitting: Anesthesiology

## 2011-12-16 ENCOUNTER — Encounter (HOSPITAL_COMMUNITY): Payer: Self-pay | Admitting: Anesthesiology

## 2011-12-16 ENCOUNTER — Encounter (HOSPITAL_COMMUNITY): Payer: Self-pay | Admitting: *Deleted

## 2011-12-16 DIAGNOSIS — I10 Essential (primary) hypertension: Secondary | ICD-10-CM | POA: Diagnosis present

## 2011-12-16 DIAGNOSIS — Z8701 Personal history of pneumonia (recurrent): Secondary | ICD-10-CM

## 2011-12-16 DIAGNOSIS — E041 Nontoxic single thyroid nodule: Secondary | ICD-10-CM | POA: Diagnosis present

## 2011-12-16 DIAGNOSIS — D649 Anemia, unspecified: Secondary | ICD-10-CM | POA: Diagnosis present

## 2011-12-16 DIAGNOSIS — K219 Gastro-esophageal reflux disease without esophagitis: Secondary | ICD-10-CM | POA: Diagnosis present

## 2011-12-16 DIAGNOSIS — Z880 Allergy status to penicillin: Secondary | ICD-10-CM

## 2011-12-16 DIAGNOSIS — E7800 Pure hypercholesterolemia, unspecified: Secondary | ICD-10-CM | POA: Diagnosis present

## 2011-12-16 DIAGNOSIS — M21069 Valgus deformity, not elsewhere classified, unspecified knee: Secondary | ICD-10-CM | POA: Diagnosis present

## 2011-12-16 DIAGNOSIS — IMO0002 Reserved for concepts with insufficient information to code with codable children: Principal | ICD-10-CM | POA: Diagnosis present

## 2011-12-16 DIAGNOSIS — E871 Hypo-osmolality and hyponatremia: Secondary | ICD-10-CM | POA: Diagnosis not present

## 2011-12-16 DIAGNOSIS — M179 Osteoarthritis of knee, unspecified: Secondary | ICD-10-CM | POA: Diagnosis present

## 2011-12-16 DIAGNOSIS — M1710 Unilateral primary osteoarthritis, unspecified knee: Secondary | ICD-10-CM | POA: Diagnosis present

## 2011-12-16 DIAGNOSIS — Z96659 Presence of unspecified artificial knee joint: Secondary | ICD-10-CM

## 2011-12-16 HISTORY — PX: TOTAL KNEE ARTHROPLASTY: SHX125

## 2011-12-16 LAB — TYPE AND SCREEN: Antibody Screen: NEGATIVE

## 2011-12-16 SURGERY — ARTHROPLASTY, KNEE, TOTAL
Anesthesia: Spinal | Site: Knee | Laterality: Right | Wound class: Clean

## 2011-12-16 MED ORDER — TEMAZEPAM 15 MG PO CAPS: 15.0000 mg | ORAL_CAPSULE | Freq: Every evening | ORAL | Status: DC | PRN

## 2011-12-16 MED ORDER — DIPHENHYDRAMINE HCL 12.5 MG/5ML PO ELIX
12.5000 mg | ORAL_SOLUTION | Freq: Four times a day (QID) | ORAL | Status: DC | PRN
  Filled 2011-12-16: qty 5

## 2011-12-16 MED ORDER — BUPIVACAINE ON-Q PAIN PUMP (FOR ORDER SET NO CHG)
INJECTION | Status: DC
  Filled 2011-12-16: qty 1

## 2011-12-16 MED ORDER — BUPIVACAINE IN DEXTROSE 0.75-8.25 % IT SOLN
INTRATHECAL | Status: DC | PRN
  Administered 2011-12-16: 2 mL via INTRATHECAL

## 2011-12-16 MED ORDER — BUPIVACAINE 0.25 % ON-Q PUMP SINGLE CATH 300ML: 300.0000 mL | INJECTION | Status: DC

## 2011-12-16 MED ORDER — MORPHINE SULFATE (PF) 1 MG/ML IV SOLN
INTRAVENOUS | Status: DC
  Administered 2011-12-16: 11:00:00 via INTRAVENOUS
  Administered 2011-12-16: 2 mg via INTRAVENOUS
  Administered 2011-12-17: 3 mg via INTRAVENOUS
  Administered 2011-12-17: 2 mg via INTRAVENOUS
  Administered 2011-12-17: 1 mg via INTRAVENOUS

## 2011-12-16 MED ORDER — ACETAMINOPHEN 10 MG/ML IV SOLN
1000.0000 mg | Freq: Once | INTRAVENOUS | Status: AC
  Administered 2011-12-16: 1000 mg via INTRAVENOUS

## 2011-12-16 MED ORDER — METHOCARBAMOL 500 MG PO TABS
500.0000 mg | ORAL_TABLET | Freq: Four times a day (QID) | ORAL | Status: DC | PRN
  Administered 2011-12-16 – 2011-12-19 (×5): 500 mg via ORAL
  Filled 2011-12-16 (×5): qty 1

## 2011-12-16 MED ORDER — PROPOFOL 10 MG/ML IV EMUL
INTRAVENOUS | Status: DC | PRN
  Administered 2011-12-16: 100 ug/kg/min via INTRAVENOUS

## 2011-12-16 MED ORDER — METOCLOPRAMIDE HCL 10 MG PO TABS
5.0000 mg | ORAL_TABLET | Freq: Three times a day (TID) | ORAL | Status: DC | PRN
  Filled 2011-12-16: qty 1

## 2011-12-16 MED ORDER — ONDANSETRON HCL 4 MG/2ML IJ SOLN: 4.0000 mg | Freq: Four times a day (QID) | INTRAMUSCULAR | Status: DC | PRN

## 2011-12-16 MED ORDER — POLYETHYLENE GLYCOL 3350 17 G PO PACK
17.0000 g | PACK | Freq: Every day | ORAL | Status: DC | PRN
  Filled 2011-12-16: qty 1

## 2011-12-16 MED ORDER — SIMVASTATIN 40 MG PO TABS
40.0000 mg | ORAL_TABLET | Freq: Every evening | ORAL | Status: DC
  Administered 2011-12-16 – 2011-12-18 (×3): 40 mg via ORAL
  Filled 2011-12-16 (×4): qty 1

## 2011-12-16 MED ORDER — METHOCARBAMOL 100 MG/ML IJ SOLN
500.0000 mg | Freq: Four times a day (QID) | INTRAVENOUS | Status: DC | PRN
  Administered 2011-12-16: 500 mg via INTRAVENOUS
  Filled 2011-12-16: qty 5

## 2011-12-16 MED ORDER — NALOXONE HCL 0.4 MG/ML IJ SOLN: 0.4000 mg | INTRAMUSCULAR | Status: DC | PRN

## 2011-12-16 MED ORDER — BUPIVACAINE 0.25 % ON-Q PUMP SINGLE CATH 300ML
INJECTION | Status: DC | PRN
  Administered 2011-12-16: 300 mL

## 2011-12-16 MED ORDER — SODIUM CHLORIDE 0.9 % IV SOLN: INTRAVENOUS | Status: DC

## 2011-12-16 MED ORDER — PROMETHAZINE HCL 25 MG/ML IJ SOLN: 6.2500 mg | INTRAMUSCULAR | Status: DC | PRN

## 2011-12-16 MED ORDER — SODIUM CHLORIDE 0.9 % IJ SOLN: 9.0000 mL | INTRAMUSCULAR | Status: DC | PRN

## 2011-12-16 MED ORDER — DEXTROSE-NACL 5-0.9 % IV SOLN
INTRAVENOUS | Status: DC
  Administered 2011-12-16: 19:00:00 via INTRAVENOUS
  Administered 2011-12-17: 20 mL/h via INTRAVENOUS

## 2011-12-16 MED ORDER — BUPIVACAINE 0.25 % ON-Q PUMP SINGLE CATH 300ML
INJECTION | Status: AC
  Filled 2011-12-16: qty 300

## 2011-12-16 MED ORDER — SODIUM CHLORIDE 0.9 % IR SOLN
Status: DC | PRN
  Administered 2011-12-16: 1

## 2011-12-16 MED ORDER — FENTANYL CITRATE 0.05 MG/ML IJ SOLN
INTRAMUSCULAR | Status: DC | PRN
  Administered 2011-12-16: 100 ug via INTRAVENOUS

## 2011-12-16 MED ORDER — ONDANSETRON HCL 4 MG PO TABS
4.0000 mg | ORAL_TABLET | Freq: Four times a day (QID) | ORAL | Status: DC | PRN
  Filled 2011-12-16: qty 1

## 2011-12-16 MED ORDER — DEXAMETHASONE SODIUM PHOSPHATE 10 MG/ML IJ SOLN
10.0000 mg | Freq: Once | INTRAMUSCULAR | Status: AC
  Administered 2011-12-16: 10 mg via INTRAVENOUS

## 2011-12-16 MED ORDER — RIVAROXABAN 10 MG PO TABS
10.0000 mg | ORAL_TABLET | Freq: Every day | ORAL | Status: DC
  Administered 2011-12-17 – 2011-12-19 (×3): 10 mg via ORAL
  Filled 2011-12-16 (×3): qty 1

## 2011-12-16 MED ORDER — LACTATED RINGERS IV SOLN
INTRAVENOUS | Status: DC | PRN
  Administered 2011-12-16 (×3): via INTRAVENOUS

## 2011-12-16 MED ORDER — DIPHENHYDRAMINE HCL 12.5 MG/5ML PO ELIX: 12.5000 mg | ORAL_SOLUTION | ORAL | Status: DC | PRN

## 2011-12-16 MED ORDER — HYDROMORPHONE HCL PF 1 MG/ML IJ SOLN: 0.2500 mg | INTRAMUSCULAR | Status: DC | PRN

## 2011-12-16 MED ORDER — DOCUSATE SODIUM 100 MG PO CAPS
100.0000 mg | ORAL_CAPSULE | Freq: Two times a day (BID) | ORAL | Status: DC
  Administered 2011-12-16 – 2011-12-19 (×7): 100 mg via ORAL
  Filled 2011-12-16 (×8): qty 1

## 2011-12-16 MED ORDER — PHENOL 1.4 % MT LIQD
1.0000 | OROMUCOSAL | Status: DC | PRN
  Filled 2011-12-16: qty 177

## 2011-12-16 MED ORDER — ZOLPIDEM TARTRATE 5 MG PO TABS
5.0000 mg | ORAL_TABLET | Freq: Every evening | ORAL | Status: DC | PRN
Start: 2011-12-16 — End: 2011-12-19

## 2011-12-16 MED ORDER — OXYCODONE HCL 5 MG PO TABS
5.0000 mg | ORAL_TABLET | ORAL | Status: DC | PRN
  Administered 2011-12-17 – 2011-12-18 (×4): 10 mg via ORAL
  Administered 2011-12-18: 5 mg via ORAL
  Administered 2011-12-18 (×2): 10 mg via ORAL
  Administered 2011-12-18 – 2011-12-19 (×4): 5 mg via ORAL
  Filled 2011-12-16: qty 1
  Filled 2011-12-16: qty 2
  Filled 2011-12-16 (×2): qty 1
  Filled 2011-12-16: qty 2
  Filled 2011-12-16: qty 1
  Filled 2011-12-16 (×4): qty 2
  Filled 2011-12-16: qty 1

## 2011-12-16 MED ORDER — ACETAMINOPHEN 10 MG/ML IV SOLN
1000.0000 mg | Freq: Four times a day (QID) | INTRAVENOUS | Status: AC
  Administered 2011-12-16 – 2011-12-17 (×4): 1000 mg via INTRAVENOUS
  Filled 2011-12-16 (×5): qty 100

## 2011-12-16 MED ORDER — FLEET ENEMA 7-19 GM/118ML RE ENEM: 1.0000 | ENEMA | Freq: Once | RECTAL | Status: AC | PRN

## 2011-12-16 MED ORDER — METOCLOPRAMIDE HCL 5 MG/ML IJ SOLN: 5.0000 mg | Freq: Three times a day (TID) | INTRAMUSCULAR | Status: DC | PRN

## 2011-12-16 MED ORDER — DIPHENHYDRAMINE HCL 50 MG/ML IJ SOLN
12.5000 mg | Freq: Four times a day (QID) | INTRAMUSCULAR | Status: DC | PRN
  Filled 2011-12-16: qty 0.25

## 2011-12-16 MED ORDER — ACETAMINOPHEN 325 MG PO TABS: 650.0000 mg | ORAL_TABLET | Freq: Four times a day (QID) | ORAL | Status: DC | PRN

## 2011-12-16 MED ORDER — MORPHINE SULFATE (PF) 1 MG/ML IV SOLN
INTRAVENOUS | Status: AC
  Filled 2011-12-16: qty 25

## 2011-12-16 MED ORDER — KETAMINE HCL 10 MG/ML IJ SOLN
INTRAMUSCULAR | Status: DC | PRN
  Administered 2011-12-16 (×2): 10 mg via INTRAVENOUS

## 2011-12-16 MED ORDER — LACTATED RINGERS IV SOLN: INTRAVENOUS | Status: DC

## 2011-12-16 MED ORDER — CEFAZOLIN SODIUM 1-5 GM-% IV SOLN
1.0000 g | INTRAVENOUS | Status: AC
  Administered 2011-12-16: 1 g via INTRAVENOUS

## 2011-12-16 MED ORDER — CEFAZOLIN SODIUM 1-5 GM-% IV SOLN
1.0000 g | Freq: Four times a day (QID) | INTRAVENOUS | Status: AC
  Administered 2011-12-16 – 2011-12-17 (×3): 1 g via INTRAVENOUS
  Filled 2011-12-16 (×3): qty 50

## 2011-12-16 MED ORDER — BUPIVACAINE 0.25 % ON-Q PUMP DUAL CATH 300 ML: INJECTION | Status: DC | PRN

## 2011-12-16 MED ORDER — MIDAZOLAM HCL 5 MG/5ML IJ SOLN
INTRAMUSCULAR | Status: DC | PRN
  Administered 2011-12-16 (×3): 1 mg via INTRAVENOUS

## 2011-12-16 MED ORDER — MENTHOL 3 MG MT LOZG
1.0000 | LOZENGE | OROMUCOSAL | Status: DC | PRN
  Filled 2011-12-16: qty 9

## 2011-12-16 MED ORDER — BISACODYL 10 MG RE SUPP
10.0000 mg | Freq: Every day | RECTAL | Status: DC | PRN
  Filled 2011-12-16: qty 1

## 2011-12-16 MED ORDER — LORATADINE 10 MG PO TABS
10.0000 mg | ORAL_TABLET | Freq: Every day | ORAL | Status: DC
  Administered 2011-12-16 – 2011-12-19 (×4): 10 mg via ORAL
  Filled 2011-12-16 (×4): qty 1

## 2011-12-16 MED ORDER — ACETAMINOPHEN 650 MG RE SUPP: 650.0000 mg | Freq: Four times a day (QID) | RECTAL | Status: DC | PRN

## 2011-12-16 MED ORDER — LOSARTAN POTASSIUM 50 MG PO TABS
100.0000 mg | ORAL_TABLET | Freq: Every day | ORAL | Status: DC
  Administered 2011-12-16 – 2011-12-19 (×4): 100 mg via ORAL
  Filled 2011-12-16 (×4): qty 2

## 2011-12-16 MED ORDER — EPHEDRINE SULFATE 50 MG/ML IJ SOLN
INTRAMUSCULAR | Status: DC | PRN
  Administered 2011-12-16 (×4): 5 mg via INTRAVENOUS

## 2011-12-16 SURGICAL SUPPLY — 49 items
BAG ZIPLOCK 12X15 (MISCELLANEOUS) ×2 IMPLANT
BANDAGE ELASTIC 6 VELCRO ST LF (GAUZE/BANDAGES/DRESSINGS) ×2 IMPLANT
BANDAGE ESMARK 6X9 LF (GAUZE/BANDAGES/DRESSINGS) ×1 IMPLANT
BLADE SAG 18X100X1.27 (BLADE) ×2 IMPLANT
BLADE SAW SGTL 11.0X1.19X90.0M (BLADE) ×2 IMPLANT
BNDG ESMARK 6X9 LF (GAUZE/BANDAGES/DRESSINGS) ×2
BOWL SMART MIX CTS (DISPOSABLE) ×2 IMPLANT
CATH KIT ON-Q SILVERSOAK 5IN (CATHETERS) ×2 IMPLANT
CEMENT HV SMART SET (Cement) ×4 IMPLANT
CLOTH BEACON ORANGE TIMEOUT ST (SAFETY) ×2 IMPLANT
CUFF TOURN SGL QUICK 34 (TOURNIQUET CUFF) ×1
CUFF TRNQT CYL 34X4X40X1 (TOURNIQUET CUFF) ×1 IMPLANT
DRAPE EXTREMITY T 121X128X90 (DRAPE) ×2 IMPLANT
DRAPE POUCH INSTRU U-SHP 10X18 (DRAPES) ×2 IMPLANT
DRAPE U-SHAPE 47X51 STRL (DRAPES) ×2 IMPLANT
DRSG ADAPTIC 3X8 NADH LF (GAUZE/BANDAGES/DRESSINGS) ×2 IMPLANT
DRSG PAD ABDOMINAL 8X10 ST (GAUZE/BANDAGES/DRESSINGS) ×2 IMPLANT
DURAPREP 26ML APPLICATOR (WOUND CARE) ×2 IMPLANT
ELECT REM PT RETURN 9FT ADLT (ELECTROSURGICAL) ×2
ELECTRODE REM PT RTRN 9FT ADLT (ELECTROSURGICAL) ×1 IMPLANT
EVACUATOR 1/8 PVC DRAIN (DRAIN) ×2 IMPLANT
FACESHIELD LNG OPTICON STERILE (SAFETY) ×10 IMPLANT
GLOVE BIO SURGEON STRL SZ7.5 (GLOVE) ×2 IMPLANT
GLOVE BIO SURGEON STRL SZ8 (GLOVE) ×2 IMPLANT
GLOVE BIOGEL PI IND STRL 8 (GLOVE) ×2 IMPLANT
GLOVE BIOGEL PI INDICATOR 8 (GLOVE) ×2
GOWN STRL NON-REIN LRG LVL3 (GOWN DISPOSABLE) ×2 IMPLANT
GOWN STRL REIN XL XLG (GOWN DISPOSABLE) ×2 IMPLANT
HANDPIECE INTERPULSE COAX TIP (DISPOSABLE) ×1
IMMOBILIZER KNEE 20 (SOFTGOODS) ×2
IMMOBILIZER KNEE 20 THIGH 36 (SOFTGOODS) ×1 IMPLANT
KIT BASIN OR (CUSTOM PROCEDURE TRAY) ×2 IMPLANT
MANIFOLD NEPTUNE II (INSTRUMENTS) ×2 IMPLANT
NS IRRIG 1000ML POUR BTL (IV SOLUTION) ×2 IMPLANT
PACK TOTAL JOINT (CUSTOM PROCEDURE TRAY) ×2 IMPLANT
PADDING CAST COTTON 6X4 STRL (CAST SUPPLIES) ×6 IMPLANT
POSITIONER SURGICAL ARM (MISCELLANEOUS) ×2 IMPLANT
SET HNDPC FAN SPRY TIP SCT (DISPOSABLE) ×1 IMPLANT
SPONGE GAUZE 4X4 12PLY (GAUZE/BANDAGES/DRESSINGS) ×2 IMPLANT
STRIP CLOSURE SKIN 1/2X4 (GAUZE/BANDAGES/DRESSINGS) ×4 IMPLANT
SUCTION FRAZIER 12FR DISP (SUCTIONS) ×2 IMPLANT
SUT MNCRL AB 4-0 PS2 18 (SUTURE) ×2 IMPLANT
SUT PDS AB 1 CT1 27 (SUTURE) ×6 IMPLANT
SUT VIC AB 2-0 CT1 27 (SUTURE) ×3
SUT VIC AB 2-0 CT1 TAPERPNT 27 (SUTURE) ×3 IMPLANT
TOWEL OR 17X26 10 PK STRL BLUE (TOWEL DISPOSABLE) ×4 IMPLANT
TRAY FOLEY CATH 14FRSI W/METER (CATHETERS) ×2 IMPLANT
WATER STERILE IRR 1500ML POUR (IV SOLUTION) ×2 IMPLANT
WRAP KNEE MAXI GEL POST OP (GAUZE/BANDAGES/DRESSINGS) ×2 IMPLANT

## 2011-12-16 NOTE — H&P (View-Only) (Signed)
[name]  DOB: 03/05/1930 Married / Language: English / Race: White / Female  Date of Admission:  12/16/2011  Chief Complaint:  Right Knee pain  History of Present Illness The patient is a 78 year old female who comes in today for a preoperative History and Physical. The patient is scheduled for a right total knee arthroplasty to be performed by [name], MD at [facility] on 12/16/2011. 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 [name] feels that her left knee is doing fantastic. Right knee is the only thing that is bothering her badly now. She has significant valgus deformity in the right knee with bone on bone changes. She feels like the left knee has recovered beautifully and is ready to do something about the right knee. They have been treated conservatively in the past for the above stated problem and despite conservative measures, they continue to have progressive pain and severe functional limitations and dysfunction. They have failed non-operative management including home exercise, medications, and injections. It is felt that they would benefit from undergoing total joint replacement. Risks and benefits of the procedure have been discussed with the patient and they elect to proceed with surgery. There are no active contraindications to surgery such as ongoing infection or rapidly progressive neurological disease.  Problem List/Past Medical Hypercholesterolemia History of Pneumonia Hypertension   Allergies PENICILLIN - Rash.   Family History Father. Deceased. Age 55, History of Brochitis Mother. Deceased, Myocardial infarction. Age 66   Social History No alcohol use Tobacco use. Former smoker. never smoker Marital status. married Living situation. live with spouse Children. 2 Alcohol use. never consumed alcohol Post-Surgical Plans. Plan to go home. Advance Directives. Living Will   Medication  History Simvastatin (40MG Tablet, Oral) Active. Losartan Potassium (100MG Tablet, Oral) Active.   Past Surgical History Tubal Ligation Colonoscopy   Review of Systems General:Not Present- Chills, Fever, Night Sweats, Fatigue, Weight Gain, Weight Loss and Memory Loss. Skin:Not Present- Hives, Itching, Rash, Eczema and Lesions. HEENT:Not Present- Tinnitus, Headache, Double Vision, Visual Loss, Hearing Loss and Dentures. Respiratory:Not Present- Shortness of breath with exertion, Shortness of breath at rest, Allergies, Coughing up blood and Chronic Cough. Cardiovascular:Not Present- Chest Pain, Racing/skipping heartbeats, Difficulty Breathing Lying Down, Murmur, Swelling and Palpitations. Gastrointestinal:Not Present- Bloody Stool, Heartburn, Abdominal Pain, Vomiting, Nausea, Constipation, Diarrhea, Difficulty Swallowing, Jaundice and Loss of appetitie. Female Genitourinary:Not Present- Blood in Urine, Urinary frequency, Weak urinary stream, Discharge, Flank Pain, Incontinence, Painful Urination, Urgency, Urinary Retention and Urinating at Night. Musculoskeletal:Not Present- Muscle Weakness, Muscle Pain, Joint Swelling, Joint Pain, Back Pain, Morning Stiffness and Spasms. Neurological:Not Present- Tremor, Dizziness, Blackout spells, Paralysis, Difficulty with balance and Weakness. Psychiatric:Not Present- Insomnia.   Vitals Weight: 158 lb Height: 64 in Body Surface Area: 1.8 m Body Mass Index: 27.12 kg/m Pulse: 78 (Regular) Resp.: 14 (Unlabored) BP: 142/78 (Sitting, Left Arm, Standard)    Physical Exam The physical exam findings are as follows: Patient is a 78 year old female with conitnued knee pain. Patient is accompanied today by her husband.   General Mental Status - Alert, cooperative and good historian. General Appearance- pleasant. Not in acute distress. Orientation- Oriented X3. Build & Nutrition- Well nourished and Well  developed.   Head and Neck Head- normocephalic, atraumatic . Neck Global Assessment- supple. no bruit auscultated on the right and no bruit auscultated on the left.   Eye Pupil- Bilateral- round and reactive. Motion- Bilateral- EOMI.   Chest and Lung Exam   Auscultation: Breath sounds:- clear at anterior chest wall and - clear at posterior chest wall. Adventitious sounds:- No Adventitious sounds.   Cardiovascular Auscultation:Rhythm- Regular rate and rhythm. Heart Sounds- S1 WNL and S2 WNL. Murmurs & Other Heart Sounds:Auscultation of the heart reveals - No Murmurs.   Abdomen Palpation/Percussion:Tenderness- Abdomen is non-tender to palpation. Rigidity (guarding)- Abdomen is soft. Auscultation:Auscultation of the abdomen reveals - Bowel sounds normal.   Female Genitourinary Not done, not pertinent to present illness  Musculoskeletal Right knee with moderate crepitus, valgus deformity, tender lateral greater than medial joint line.  Assessment & Plan Osteoarthrosis Right Knee  Note: Patient is for a Right Total Knee Arthroplasty by Dr. Aluisio.  Plan is to go home after the surgery.  PCP - [name] - Patient has been seen by Dr. Hall preoperatively and felt to be stable for surgery.  [name], PA-C   

## 2011-12-16 NOTE — Transfer of Care (Signed)
Immediate Anesthesia Transfer of Care Note  Patient: [name]  Procedure(s) Performed: Procedure(s) (LRB): TOTAL KNEE ARTHROPLASTY (Right)  Patient Location: PACU  Anesthesia Type: Spinal  Level of Consciousness: awake, alert  and oriented  Airway & Oxygen Therapy: Patient Spontanous Breathing and Patient connected to face mask oxygen  Post-op Assessment: Report given to PACU RN, Post -op Vital signs reviewed and stable and SAB level T12  Post vital signs: Reviewed and stable  Complications: No apparent anesthesia complications

## 2011-12-16 NOTE — Progress Notes (Signed)
Utilization review completed.  

## 2011-12-16 NOTE — Anesthesia Procedure Notes (Signed)
Spinal  Patient location during procedure: OR Staffing Anesthesiologist: Ying Blankenhorn J Performed by: anesthesiologist  Preanesthetic Checklist Completed: patient identified, site marked, surgical consent, pre-op evaluation, timeout performed, IV checked, risks and benefits discussed and monitors and equipment checked Spinal Block Patient position: sitting Prep: Betadine Patient monitoring: heart rate, continuous pulse ox and blood pressure Approach: midline Location: L3-4 Injection technique: single-shot Needle Needle type: Spinocan  Needle gauge: 22 G Needle length: 9 cm Additional Notes Expiration date of kit checked and confirmed. Patient tolerated procedure well, without complications.     

## 2011-12-16 NOTE — Anesthesia Preprocedure Evaluation (Addendum)
Anesthesia Evaluation  Patient identified by MRN, date of birth, ID band Patient awake    Reviewed: Allergy & Precautions, H&P , NPO status , Patient's Chart, lab work & pertinent test results  Airway Mallampati: II TM Distance: >3 FB Neck ROM: Full    Dental No notable dental hx.    Pulmonary pneumonia ,  clear to auscultation  Pulmonary exam normal       Cardiovascular hypertension, Pt. on medications Regular Normal    Neuro/Psych Negative Neurological ROS  Negative Psych ROS   GI/Hepatic Neg liver ROS, GERD-  ,  Endo/Other  Negative Endocrine ROS  Renal/GU negative Renal ROS  Genitourinary negative   Musculoskeletal negative musculoskeletal ROS (+)   Abdominal   Peds negative pediatric ROS (+)  Hematology negative hematology ROS (+)   Anesthesia Other Findings   Reproductive/Obstetrics negative OB ROS                           Anesthesia Physical Anesthesia Plan  ASA: II  Anesthesia Plan: Spinal   Post-op Pain Management:    Induction: Intravenous  Airway Management Planned: Simple Face Mask  Additional Equipment:   Intra-op Plan:   Post-operative Plan: Extubation in OR  Informed Consent: I have reviewed the patients History and Physical, chart, labs and discussed the procedure including the risks, benefits and alternatives for the proposed anesthesia with the patient or authorized representative who has indicated his/her understanding and acceptance.   Dental advisory given  Plan Discussed with: CRNA  Anesthesia Plan Comments: (Discussed risks/benefits of spinal including headache, backache, failure, bleeding, infection, and nerve damage. Patient consents to spinal. Questions answered. Coagulation studies and platelet count acceptable. )       Anesthesia Quick Evaluation

## 2011-12-16 NOTE — Op Note (Signed)
Pre-operative diagnosis- Osteoarthritis Right knee(s)  Post-operative diagnosis- Osteoarthritis  Right knee(s)  Procedure-   Right Total Knee Arthroplasty  Surgeon- Adrienne Rankin. Sriman Tally, MD  Assistant- Dimitri Ped, PA-C   Anesthesia-  Spinal   EBL- * No blood loss amount entered *   Drains Hemovac   Tourniquet time  Total Tourniquet Time Documented: Thigh (Right) - 32 minutes   Complications- None  Condition-PACU - hemodynamically stable.   Brief Clinical Note  [name] is a 78 y.o. year old female with end stage OA of her left knee with progressively worsening pain and dysfunction. She has constant pain, with activity and at rest and significant functional deficits with difficulties even with ADLs. She has had extensive non-op management including analgesics, injections of cortisone and viscosupplements, and home exercise program, but remains in significant pain with significant dysfunction. Radiographs show bone on bone arthritis lateral and patellofemoral with large valgus deformity. She presents now for left Total Knee Arthroplasty.   Procedure in detail---       The patient is brought into the operating room and positioned supine on the operating table. After successful administration of Spinal anesthetic, a tourniquet is placed high on the Right thigh(s) and the lower extremity is prepped and draped in the usual sterile fashion. Time out is performed by the operating team and then the Right  lower extremity is wrapped in Esmarch, knee flexed and the tourniquet inflated to 300 mmHg.       A midline incision is made with a ten blade through the subcutaneous tissue to the level of the extensor mechanism. A fresh blade is used to make a lateral parapatellar arthrotomy due to the patients' valgus deformity. Soft tissue over the proximal lateral tibia is subperiosteally elevated to the joint line with a knife to the posterolateral corner but not including the structures of the  posterolateral corner. Soft tissue over the proximal medial tibia is elevated with attention being paid to avoiding the patellar tendon on the tibial tubercle. The patella is everted medially, knee flexed 90 degrees and the ACL and PCL are removed. Findings are bone on bone lateral and patellofemoral with large lateral osteophytes .       The drill is used to create a starting hole in the distal femur and the canal is thoroughly irrigated with sterile saline to remove the fatty contents. The 5 degree Right  valgus alignment guide is placed into the femoral canal and the distal femoral cutting block is pinned to remove 11  mm off the distal femur. Resection is made with an oscillating saw.      The tibia is subluxed forward and the menisci are removed. The extramedullary alignment guide is placed referencing proximally at the medial aspect of the tibial tubercle and distally along the second metatarsal axis and tibial crest. The block is pinned to remove 2mm off the more deficient lateral side. Resection is made with an oscillating saw. Size 3 is the most appropriate size for the tibia and the proximal tibia is prepared with the modular drill and keel punch for that size.      The femoral sizing guide is placed and size 3  is most appropriate. Rotation is marked off the epicondylar axis and confirmed by creating a rectangular flexion gap at 90 degrees. The size 3  cutting block is pinned in this rotation and the anterior, posterior and chamfer cuts are made with the oscillating saw. The intercondylar block is then placed and that  cut is made.      Trial size 3  tibial component, trial size 3  posterior stabilized femur and a 12.5  mm posterior stabilized rotating platform insert trial is placed. Full extension is achieved with excellent varus/valgus and   anterior/posterior balance throughout full range of motion. The patella is everted and thickness measured to be 22  mm. Free hand resection is taken to 12 mm, a  35 template is placed, lug holes are drilled, trial patella is placed, and it tracks normally. Osteophytes are removed off the posterior femur with the trial in place. All trials are removed and the cut bone surfaces prepared with pulsatile lavage. Cement is mixed and once ready for implantation, the size 3  tibial implant, size 3 posterior stabilized femoral component, and the size 35  patella are cemented in place and the patella is held with the clamp. The trial insert is placed and the knee held in full extension. All extruded cement is removed and once the cement is hard the permanent 12.5  mm posterior stabilized rotating platform insert is placed into the tibial tray.      The wound is copiously irrigated with saline solution and the tourniquet is released for a total   tourniquet time of 31  minutes. Bleeding is identified and controlled with electrocautery. The extensor mechanism is closed with interrupted #1 PDS leaving open a small area from the superior to inferior pole of the patella to serve as a mini lateral release. Flexion against gravity is 140  degrees and the patella tracks normally. Subcutaneous tissue is closed with 2.0 vicryl and subcuticular with running 4.0 Monocryl. The catheter for the Marcaine pain pump is placed and the pump is initiated. The incision is cleaned and dried and steri-strips and a bulky sterile dressing are applied. The limb is placed into a knee immobilizer and the patient is awakened and transported to recovery in stable condition.      Please note that a surgical assistant was a medical necessity for this procedure in order to perform it in a safe and expeditious manner. Surgical assistant was necessary to retract the ligaments and vital neurovascular structures to prevent injury to them and also necessary for proper positioning of the limb to allow for anatomic placement of the prosthesis.    Adrienne Rankin Brandn Mcgath, MD    12/16/2011, 9:58 AM

## 2011-12-16 NOTE — Interval H&P Note (Signed)
History and Physical Interval Note:  12/16/2011 8:35 AM  [name]  has presented today for surgery, with the diagnosis of osteoarthritis right knee   The various methods of treatment have been discussed with the patient and family. After consideration of risks, benefits and other options for treatment, the patient has consented to  Procedure(s) (LRB): TOTAL KNEE ARTHROPLASTY (Right) as a surgical intervention .  The patients' history has been reviewed, patient examined, no change in status, stable for surgery.  I have reviewed the patients' chart and labs.  Questions were answered to the patient's satisfaction.     Loanne Drilling

## 2011-12-16 NOTE — Anesthesia Postprocedure Evaluation (Addendum)
  Anesthesia Post-op Note  Patient: [name]  Procedure(s) Performed: Procedure(s) (LRB): TOTAL KNEE ARTHROPLASTY (Right)  Patient Location: PACU  Anesthesia Type: Spinal  Level of Consciousness: awake and alert   Airway and Oxygen Therapy: Patient Spontanous Breathing  Post-op Pain: mild  Post-op Assessment: Post-op Vital signs reviewed, Patient's Cardiovascular Status Stable, Respiratory Function Stable, Patent Airway and No signs of Nausea or vomiting  Post-op Vital Signs: stable  Complications: No apparent anesthesia complications. Moving both feet. No complaints

## 2011-12-17 DIAGNOSIS — E871 Hypo-osmolality and hyponatremia: Secondary | ICD-10-CM | POA: Diagnosis not present

## 2011-12-17 LAB — CBC
Hemoglobin: 9 g/dL — ABNORMAL LOW (ref 12.0–15.0)
RBC: 3.12 MIL/uL — ABNORMAL LOW (ref 3.87–5.11)
WBC: 9.4 10*3/uL (ref 4.0–10.5)

## 2011-12-17 LAB — BASIC METABOLIC PANEL
CO2: 23 mEq/L (ref 19–32)
Chloride: 96 mEq/L (ref 96–112)
GFR calc non Af Amer: 77 mL/min — ABNORMAL LOW (ref >90)
Glucose, Bld: 162 mg/dL — ABNORMAL HIGH (ref 70–99)
Potassium: 3.9 mEq/L (ref 3.5–5.1)
Sodium: 127 mEq/L — ABNORMAL LOW (ref 135–145)

## 2011-12-17 MED ORDER — MORPHINE SULFATE 2 MG/ML IJ SOLN: 1.0000 mg | INTRAMUSCULAR | Status: DC | PRN

## 2011-12-17 MED ORDER — POLYSACCHARIDE IRON 150 MG PO CAPS
150.0000 mg | ORAL_CAPSULE | Freq: Every day | ORAL | Status: DC
  Administered 2011-12-17 – 2011-12-19 (×3): 150 mg via ORAL
  Filled 2011-12-17 (×3): qty 1

## 2011-12-17 NOTE — Evaluation (Signed)
Physical Therapy Evaluation Patient Details Name: [name] MRN: [id number] DOB: 1930/04/21 Today's Date: 12/17/2011  Problem List:  Patient Active Problem List  Diagnoses  . KNEE, ARTHRITIS, DEGEN./OSTEO  . TEAR MEDIAL MENISCUS  . TEAR LATERAL MENISCUS  . OA (osteoarthritis) of knee  . Postop Hyponatremia    Past Medical History:  Past Medical History  Diagnosis Date  . Hyperlipidemia   . GERD (gastroesophageal reflux disease)   . Thyroid nodule     with biopsy- states following medically  . Pneumonia     2 years ago/ states occ cough with sinus drainage- no fever  . Hypertension     clearance with note Dr Margo Aye on chart   Past Surgical History:  Past Surgical History  Procedure Date  . Joint replacement     left knee  . Colonoscopy   . Tubal ligation     PT Assessment/Plan/Recommendation PT Assessment Clinical Impression Statement: Pt doing well for POD #1, expect good progress. HHPT recommended. PT Recommendation/Assessment: Patient will need skilled PT in the acute care venue PT Problem List: Decreased strength;Decreased range of motion;Decreased activity tolerance;Decreased mobility;Decreased knowledge of use of DME;Pain Barriers to Discharge: None PT Therapy Diagnosis : Difficulty walking;Acute pain PT Plan PT Frequency: 7X/week PT Treatment/Interventions: DME instruction;Gait training;Stair training;Therapeutic activities;Therapeutic exercise;Patient/family education PT Recommendation Follow Up Recommendations: Home health PT Equipment Recommended: None recommended by PT PT Goals  Acute Rehab PT Goals PT Goal Formulation: With patient/family Time For Goal Achievement: 5 days Pt will go Sit to Supine/Side: with modified independence PT Goal: Sit to Supine/Side - Progress: Goal set today Pt will go Sit to Stand: with modified independence;with upper extremity assist PT Goal: Sit to Stand - Progress: Goal set today Pt will Ambulate: >150 feet;with  modified independence;with rolling walker PT Goal: Ambulate - Progress: Goal set today Pt will Go Up / Down Stairs: 3-5 stairs;with min assist;with least restrictive assistive device PT Goal: Up/Down Stairs - Progress: Goal set today Pt will Perform Home Exercise Program: with min assist PT Goal: Perform Home Exercise Program - Progress: Goal set today  PT Evaluation Precautions/Restrictions  Precautions Precautions: Knee Restrictions Weight Bearing Restrictions: Yes (WBAT) RLE Weight Bearing: Weight bearing as tolerated Prior Functioning  Home Living Lives With: Spouse Receives Help From: Family Type of Home: House Home Layout: One level Home Access: Stairs to enter Entrance Stairs-Rails: Right Entrance Stairs-Number of Steps: 4 Home Adaptive Equipment: Walker - rolling Prior Function Level of Independence: Independent with basic ADLs;Independent with homemaking with ambulation;Independent with gait;Independent with transfers Able to Take Stairs?: Yes Cognition Cognition Arousal/Alertness: Awake/alert Overall Cognitive Status: Appears within functional limits for tasks assessed Orientation Level: Oriented X4 Sensation/Coordination Sensation Light Touch: Appears Intact Coordination Gross Motor Movements are Fluid and Coordinated: Yes Fine Motor Movements are Fluid and Coordinated: Yes Extremity Assessment RUE Assessment RUE Assessment: Within Functional Limits LUE Assessment LUE Assessment: Within Functional Limits RLE Assessment RLE Assessment: Exceptions to [facility] RLE AROM (degrees) Right Knee Flexion 0-140: 35  LLE Assessment LLE Assessment: Within Functional Limits Mobility (including Balance) Bed Mobility Bed Mobility: Yes Supine to Sit: 4: Min assist;HOB flat;With rails Supine to Sit Details (indicate cue type and reason): pt 80% Transfers Transfers: Yes Sit to Stand: 4: Min assist;From bed;With upper extremity assist Sit to Stand Details (indicate cue type  and reason): VCs hand placement, pt 75% Stand to Sit: 5: Supervision;With upper extremity assist;With armrests;To chair/3-in-1 Ambulation/Gait Ambulation/Gait: Yes Ambulation/Gait Assistance: 5: Supervision Ambulation/Gait Assistance Details (indicate cue type  and reason): VCs sequencing and for flexed posture Ambulation Distance (Feet): 35 Feet Assistive device: Rolling walker Gait Pattern: Step-to pattern    Exercise  Total Joint Exercises Ankle Circles/Pumps: AROM;Both;10 reps;Supine Quad Sets: AROM;Both;10 reps;Supine Heel Slides: AAROM;Right;10 reps;Supine End of Session PT - End of Session Equipment Utilized During Treatment: Gait belt;Right knee immobilizer General Behavior During Session: [facility] for tasks performed Cognition: [facility] for tasks performed  Tamala Ser 12/17/2011, 11:36 AM  [phone]

## 2011-12-17 NOTE — Progress Notes (Signed)
Physical Therapy Treatment Patient Details Name: [name] MRN: [id number] DOB: [birth year] Today's Date: 12/17/2011  PT Assessment/Plan  PT - Assessment/Plan Comments on Treatment Session: Pt doing well with ambulation and exercises.  Ortho tech notified pt ready for CPM. PT Plan: Discharge plan remains appropriate;Frequency remains appropriate Follow Up Recommendations: Home health PT Equipment Recommended: None recommended by PT PT Goals  Acute Rehab PT Goals PT Goal: Sit to Supine/Side - Progress: Progressing toward goal PT Goal: Sit to Stand - Progress: Progressing toward goal PT Goal: Ambulate - Progress: Progressing toward goal PT Goal: Perform Home Exercise Program - Progress: Progressing toward goal  PT Treatment Precautions/Restrictions  Precautions Precautions: Knee Required Braces or Orthoses: Yes Knee Immobilizer: Discontinue once straight leg raise with < 10 degree lag Restrictions Weight Bearing Restrictions: Yes (WBAT) RLE Weight Bearing: Weight bearing as tolerated Mobility (including Balance) Bed Mobility Bed Mobility: Yes Sit to Supine: 4: Min assist;HOB elevated (comment degrees) Sit to Supine - Details (indicate cue type and reason): assist for R LE Transfers Transfers: Yes Sit to Stand: 4: Min assist;With upper extremity assist;From chair/3-in-1;With armrests Sit to Stand Details (indicate cue type and reason): verbal cues for armrests Stand to Sit: 4: Min assist;With upper extremity assist;To bed Stand to Sit Details: verbal cues for hand placement and R LE forward Ambulation/Gait Ambulation/Gait: Yes Ambulation/Gait Assistance: 4: Min assist Ambulation/Gait Assistance Details (indicate cue type and reason): min/guard, verbal cue for WBing (pt reports unable to take much weight today) Ambulation Distance (Feet): 50 Feet Assistive device: Rolling walker Gait Pattern: Step-to pattern    Exercise  Total Joint Exercises Quad Sets: AROM;Right;20  reps;Supine Short Arc Quad: AAROM;Right;Other reps (comment);Supine;Other (comment);Strengthening (20 reps) Heel Slides: AAROM;Right;10 reps;Seated (with PT active assist for increasing range) Hip ABduction/ADduction: AAROM;Strengthening;Right;10 reps;Supine End of Session PT - End of Session Equipment Utilized During Treatment: Gait belt;Right knee immobilizer Activity Tolerance: Patient tolerated treatment well Patient left: in bed;with call bell in reach;with family/visitor present General Behavior During Session: [facility] for tasks performed Cognition: [facility] for tasks performed  [name],[name] 12/17/2011, 3:17 PM Pager: [phone]

## 2011-12-17 NOTE — Progress Notes (Signed)
OT Note:  Pt screened for OT.  She recently had TKA, has DME, and assist.  No OT needs.  [place], OTR/L [phone] 12/17/2011

## 2011-12-17 NOTE — Progress Notes (Signed)
Subjective: 1 Day Post-Op Procedure(s) (LRB): TOTAL KNEE ARTHROPLASTY (Right) Patient reports pain as mild and moderate.   Patient seen in rounds with Dr. Lequita Halt. Patient has complaints of rough night but little better this morning. We will start therapy today. Plan is to go home after hospital stay.  Objective: Vital signs in last 24 hours: Temp:  [94.5 F (34.7 C)-98.8 F (37.1 C)] 97.9 F (36.6 C) (02/19 0541) Pulse Rate:  [65-103] 77  (02/19 0541) Resp:  [9-20] 16  (02/19 0541) BP: (110-156)/(53-78) 138/77 mmHg (02/19 0541) SpO2:  [95 %-100 %] 98 % (02/19 0541) FiO2 (%):  [95 %] 95 % (02/19 0750) Weight:  [76.204 kg (168 lb)] 76.204 kg (168 lb) (02/18 1150)  Intake/Output from previous day:  Intake/Output Summary (Last 24 hours) at 12/17/11 0926 Last data filed at 12/17/11 0542  Gross per 24 hour  Intake   4365 ml  Output   2800 ml  Net   1565 ml    Intake/Output this shift: UOP 950 this shift  Labs:  [facility] 12/17/11 0405  HGB 9.0*    Basename 12/17/11 0405  WBC 9.4  RBC 3.12*  HCT 26.3*  PLT 327    Basename 12/17/11 0405  NA 127*  K 3.9  CL 96  CO2 23  BUN 14  CREATININE 0.78  GLUCOSE 162*  CALCIUM 8.3*   No results found for this basename: LABPT:2,INR:2 in the last 72 hours  Exam - Neurovascular intact Sensation intact distally Dressing - clean, dry, no drainage Motor function intact - moving foot and toes well on exam.  Hemovac pulled without difficulty.  Past Medical History  Diagnosis Date  . Hyperlipidemia   . GERD (gastroesophageal reflux disease)   . Thyroid nodule     with biopsy- states following medically  . Pneumonia     2 years ago/ states occ cough with sinus drainage- no fever  . Hypertension     clearance with note Dr Margo Aye on chart    Assessment/Plan: 1 Day Post-Op Procedure(s) (LRB): TOTAL KNEE ARTHROPLASTY (Right) Principal Problem:  *OA (osteoarthritis) of knee Active Problems:  Postop  Hyponatremia  Hypercholesterolemia - Simvastatin (40MG  Tablet, Oral) Active. Hypertension - Losartan Potassium (100MG  Tablet, Oral) Active. GERD - no RX meds, PRNs  Advance diet Up with therapy Continue foley due to strict I&O and urinary output monitoring Discharge home with home health  DVT Prophylaxis - Xarelto Protocol Weight-Bearing as tolerated to right leg Keep foley until tomorrow. No vaccines. D/C PCA, Change to IV push D/C O2 and Pulse OX and try on Room Air  Marquarius Lofton, Marlowe Sax 12/17/2011, 9:26 AM

## 2011-12-18 LAB — BASIC METABOLIC PANEL
Calcium: 8.4 mg/dL (ref 8.4–10.5)
GFR calc Af Amer: 89 mL/min — ABNORMAL LOW (ref >90)
GFR calc non Af Amer: 77 mL/min — ABNORMAL LOW (ref >90)
Potassium: 4.1 mEq/L (ref 3.5–5.1)
Sodium: 133 mEq/L — ABNORMAL LOW (ref 135–145)

## 2011-12-18 LAB — CBC
Hemoglobin: 9.2 g/dL — ABNORMAL LOW (ref 12.0–15.0)
MCH: 29.3 pg (ref 26.0–34.0)
MCHC: 34.1 g/dL (ref 30.0–36.0)
Platelets: 323 10*3/uL (ref 150–400)
RDW: 15.4 % (ref 11.5–15.5)

## 2011-12-18 NOTE — Progress Notes (Signed)
Physical Therapy Treatment Patient Details Name: [name] MRN: [id number] DOB: 12-11-1929 Today's Date: 12/18/2011  PT Assessment/Plan  PT - Assessment/Plan Comments on Treatment Session: c/o fatigue thsi pm PT Plan: Discharge plan remains appropriate;Frequency remains appropriate Follow Up Recommendations: Home health PT Equipment Recommended: None recommended by PT PT Goals  Acute Rehab PT Goals Pt will go Sit to Supine/Side: with modified independence PT Goal: Sit to Supine/Side - Progress: Progressing toward goal Pt will go Sit to Stand: with modified independence;with upper extremity assist PT Goal: Sit to Stand - Progress: Progressing toward goal Pt will Ambulate: >150 feet;with modified independence;with rolling walker PT Goal: Ambulate - Progress: Progressing toward goal Pt will Perform Home Exercise Program: with min assist PT Goal: Perform Home Exercise Program - Progress: Progressing toward goal  PT Treatment Precautions/Restrictions  Precautions Precautions: Knee Required Braces or Orthoses: Yes Knee Immobilizer: Discontinue once straight leg raise with < 10 degree lag Restrictions Weight Bearing Restrictions: No RLE Weight Bearing: Weight bearing as tolerated Mobility (including Balance) Bed Mobility Sit to Supine: 4: Min assist;HOB flat Sit to Supine - Details (indicate cue type and reason): assist for R LE Transfers Sit to Stand:  (pt up in room, coming out of BR) Stand to Sit: 4: Min assist;With upper extremity assist;To bed Stand to Sit Details: verbal cues for hand placement and R LE forward Ambulation/Gait Ambulation/Gait Assistance: 4: Min assist Ambulation Distance (Feet): 8 Feet Assistive device: Rolling walker Gait Pattern: Step-to pattern    Exercise  Total Joint Exercises Short Arc Quad: AAROM;10 reps;Supine End of Session PT - End of Session Equipment Utilized During Treatment: Right knee immobilizer Activity Tolerance: Patient limited  by fatigue Patient left: in bed;with call bell in reach;with family/visitor present General Behavior During Session: [facility] for tasks performed Cognition: [facility] for tasks performed  [facility] 12/18/2011, 5:17 PM

## 2011-12-18 NOTE — Progress Notes (Signed)
Physical Therapy Treatment Patient Details Name: [name] MRN: [id number] DOB: 10-Apr-1930 Today's Date: 12/18/2011  PT Assessment/Plan  PT - Assessment/Plan Comments on Treatment Session: progressing well; pt aggravated that whe doesn't have 3in1---Assistant Director notified and provided pt with one. PT Plan: Discharge plan remains appropriate;Frequency remains appropriate PT Frequency: 7X/week Follow Up Recommendations: Home health PT Equipment Recommended: None recommended by PT PT Goals  Acute Rehab PT Goals Pt will go Sit to Supine/Side: with modified independence PT Goal: Sit to Supine/Side - Progress: Progressing toward goal Pt will go Sit to Stand: with modified independence;with upper extremity assist PT Goal: Sit to Stand - Progress: Progressing toward goal Pt will Ambulate: >150 feet;with modified independence;with rolling walker PT Goal: Ambulate - Progress: Progressing toward goal Pt will Perform Home Exercise Program: with min assist PT Goal: Perform Home Exercise Program - Progress: Progressing toward goal  PT Treatment Precautions/Restrictions  Precautions Precautions: Knee Required Braces or Orthoses: Yes Knee Immobilizer: Discontinue once straight leg raise with < 10 degree lag Restrictions Weight Bearing Restrictions: No RLE Weight Bearing: Weight bearing as tolerated Mobility (including Balance) Bed Mobility Supine to Sit: 4: Min assist Supine to Sit Details (indicate cue type and reason): cues for scooting and assist with RLE Transfers Sit to Stand: 4: Min assist;With upper extremity assist;From bed;From chair/3-in-1 Sit to Stand Details (indicate cue type and reason): verbal cues for armrests Stand to Sit: 4: Min assist;With upper extremity assist;To chair/3-in-1 Stand to Sit Details: verbal cues for hand placement and R LE forward Ambulation/Gait Ambulation/Gait Assistance: 4: Min assist Ambulation/Gait Assistance Details (indicate cue type and  reason): min/guard, verbal cue for WBing and sequence, cues for posture and shoulder depression Ambulation Distance (Feet): 60 Feet Assistive device: Rolling walker Gait Pattern: Step-to pattern    Exercise  Total Joint Exercises Ankle Circles/Pumps: AROM;Both;10 reps;Supine Quad Sets: AROM;Right;10 reps Heel Slides: AAROM;Right;10 reps;Seated Hip ABduction/ADduction: AAROM;Right;10 reps Straight Leg Raises: AAROM;Right;10 reps End of Session PT - End of Session Equipment Utilized During Treatment: Gait belt;Right knee immobilizer Activity Tolerance: Patient tolerated treatment well Patient left: in chair;with call bell in reach;with family/visitor present Nurse Communication: Mobility status for transfers;Mobility status for ambulation General Behavior During Session: [facility] for tasks performed Cognition: [facility] for tasks performed  [facility] 12/18/2011, 12:16 PM

## 2011-12-18 NOTE — Progress Notes (Signed)
CARE MANAGEMENT NOTE 12/18/2011  Patient:  THEKLA, COLBORN   Account Number:  [id number]  Date Initiated:  12/18/2011  Documentation initiated by:  Colleen Can  Subjective/Objective Assessment:   dx osteoarthritis right knee; total knee replacemnt pmhx left knee replacemnt done 6 months ago.     Action/Plan:   Cm spoke with patient regarding discharge plans. Pt plans to return to [facility] where her spouse, son, and daughter will be caregivers. She already has RW, [place] seat, and reacher. She wishes to use [facility] for hh   Anticipated DC Date:  12/19/2011   Anticipated DC Plan:  HOME W HOME HEALTH SERVICES  In-house referral  Clinical Social Worker      DC Planning Services  CM consult      [facility] Choice  HOME HEALTH   Choice offered to / List presented to:  C-1 Patient   DME arranged  NA      DME agency  NA     HH arranged  HH-2 PT      [facility] agency  [facility]   Status of service:  In process, will continue to follow Medicare Important Message given?  NA - LOS <3 / Initial given by admissions (If response is "NO", the following Medicare IM given date fields will be blank) Date Medicare IM given:   Date Additional Medicare IM given:   Comments:  12/18/2011 List of [facility] agencies placed in shadow chart

## 2011-12-18 NOTE — Progress Notes (Signed)
Subjective: 2 Days Post-Op Procedure(s) (LRB): TOTAL KNEE ARTHROPLASTY (Right) Patient reports pain as mild.   Patient has complaints of pain last night that is much better today  Objective: Vital signs in last 24 hours: Temp:  [97.8 F (36.6 C)-99.1 F (37.3 C)] 99.1 F (37.3 C) (02/20 0624) Pulse Rate:  [77-98] 85  (02/20 0624) Resp:  [16] 16  (02/20 0624) BP: (122-165)/(60-84) 165/84 mmHg (02/20 0624) SpO2:  [94 %-99 %] 94 % (02/20 0624)  Intake/Output from previous day:  Intake/Output Summary (Last 24 hours) at 12/18/11 0834 Last data filed at 12/18/11 1610  Gross per 24 hour  Intake 2971.67 ml  Output   3550 ml  Net -578.33 ml    Intake/Output this shift:    Labs:  Basename 12/18/11 0431 12/17/11 0405  HGB 9.2* 9.0*    Basename 12/18/11 0431 12/17/11 0405  WBC 11.0* 9.4  RBC 3.14* 3.12*  HCT 27.0* 26.3*  PLT 323 327    Basename 12/18/11 0431 12/17/11 0405  NA 133* 127*  K 4.1 3.9  CL 102 96  CO2 24 23  BUN 15 14  CREATININE 0.79 0.78  GLUCOSE 115* 162*  CALCIUM 8.4 8.3*   No results found for this basename: LABPT:2,INR:2 in the last 72 hours  Exam - Neurologically intact Neurovascular intact Incision: dressing C/D/I No cellulitis present Compartment soft Dressing/Incision - clean, dry, no drainage Motor function intact - moving foot and toes well on exam.   Past Medical History  Diagnosis Date  . Hyperlipidemia   . GERD (gastroesophageal reflux disease)   . Thyroid nodule     with biopsy- states following medically  . Pneumonia     2 years ago/ states occ cough with sinus drainage- no fever  . Hypertension     clearance with note Dr Margo Aye on chart    Assessment/Plan: 2 Days Post-Op Procedure(s) (LRB): TOTAL KNEE ARTHROPLASTY (Right) Principal Problem:  *OA (osteoarthritis) of knee Active Problems:  Postop Hyponatremia   Advance diet Up with therapy D/C IV fluids Plan for discharge tomorrow  DVT Prophylaxis - Xarelto   Protocol Weight-Bearing as tolerated to right leg  [name] V 12/18/2011, 8:34 AM

## 2011-12-19 DIAGNOSIS — D649 Anemia, unspecified: Secondary | ICD-10-CM | POA: Diagnosis present

## 2011-12-19 LAB — CBC
Hemoglobin: 9.3 g/dL — ABNORMAL LOW (ref 12.0–15.0)
Platelets: 306 10*3/uL (ref 150–400)
RBC: 3.17 MIL/uL — ABNORMAL LOW (ref 3.87–5.11)
WBC: 12.3 10*3/uL — ABNORMAL HIGH (ref 4.0–10.5)

## 2011-12-19 MED ORDER — RIVAROXABAN 10 MG PO TABS: 10.0000 mg | ORAL_TABLET | Freq: Every day | ORAL | Status: DC

## 2011-12-19 MED ORDER — METHOCARBAMOL 500 MG PO TABS: 500.0000 mg | ORAL_TABLET | Freq: Four times a day (QID) | ORAL | Status: AC | PRN

## 2011-12-19 MED ORDER — POLYSACCHARIDE IRON 150 MG PO CAPS: 150.0000 mg | ORAL_CAPSULE | Freq: Every day | ORAL | Status: DC

## 2011-12-19 MED ORDER — OXYCODONE HCL 5 MG PO TABS: 5.0000 mg | ORAL_TABLET | ORAL | Status: AC | PRN

## 2011-12-19 NOTE — Progress Notes (Signed)
CARE MANAGEMENT NOTE 12/19/2011  Patient:  [name], [name]   Account Number:  [id number]  Date Initiated:  12/18/2011  Documentation initiated by:  Colleen Can  Subjective/Objective Assessment:   dx osteoarthritis right knee; total knee replacemnt pmhx left knee replacemnt done 6 months ago.     Action/Plan:   Cm spoke with patient regarding discharge plans. Pt plans to return to [facility] where her spouse, son, and daughter will be caregivers. She already has RW, [place] seat, and reacher. She wishes to use [facility] for hh   Anticipated DC Date:  12/19/2011   Anticipated DC Plan:  HOME W HOME HEALTH SERVICES  In-house referral  Clinical Social Worker      DC Planning Services  CM consult      [facility] Choice  HOME HEALTH   Choice offered to / List presented to:  C-1 Patient   DME arranged  NA      DME agency  NA     HH arranged  HH-2 PT      [facility] agency  [facility]   Status of service:  Completed, signed off Medicare Important Message given?  NA - LOS <3 / Initial given by admissions (If response is "NO", the following Medicare IM given date fields will be blank) Date Medicare IM given:   Date Additional Medicare IM given:    Discharge Disposition:  HOME W HOME HEALTH SERVICES  Per UR Regulation:    Comments:  12/19/2011 Raynelle Bring BSN CCM [phone] Pt for discharge today. [facility] in place and will start services tomorrow 12/20/2011

## 2011-12-19 NOTE — Progress Notes (Signed)
Physical Therapy Treatment Patient Details Name: [name] MRN: [id number] DOB: December 29, 1929 Today's Date: 12/19/2011  PT Assessment/Plan  PT - Assessment/Plan Comments on Treatment Session: doing wel, feels ready for home PT Plan: Discharge plan remains appropriate;Frequency remains appropriate Follow Up Recommendations: Home health PT Equipment Recommended: None recommended by PT PT Goals  Acute Rehab PT Goals Pt will go Sit to Supine/Side: with modified independence PT Goal: Sit to Supine/Side - Progress: Progressing toward goal Pt will go Sit to Stand: with modified independence;with upper extremity assist PT Goal: Sit to Stand - Progress: Progressing toward goal Pt will Ambulate: >150 feet;with modified independence;with rolling walker PT Goal: Ambulate - Progress: Progressing toward goal Pt will Go Up / Down Stairs: 3-5 stairs;with min assist;with least restrictive assistive device PT Goal: Up/Down Stairs - Progress: Met Pt will Perform Home Exercise Program: with min assist PT Goal: Perform Home Exercise Program - Progress: Met  PT Treatment Precautions/Restrictions  Precautions Precautions: Knee Required Braces or Orthoses: Yes Knee Immobilizer: Discontinue once straight leg raise with < 10 degree lag Restrictions Weight Bearing Restrictions: No RLE Weight Bearing: Weight bearing as tolerated Mobility (including Balance) Bed Mobility Supine to Sit: 4: Min assist Supine to Sit Details (indicate cue type and reason): assist with RLE Transfers Sit to Stand: 5: Supervision;4: Min assist;From bed Stand to Sit: 5: Supervision;4: Min assist;With upper extremity assist;To chair/3-in-1;With armrests Stand to Sit Details: verbal cues for hand placement and R LE forward Ambulation/Gait Ambulation/Gait Assistance: 5: Supervision Ambulation/Gait Assistance Details (indicate cue type and reason): cues for posture and upard gaze Ambulation Distance (Feet): 75 Feet Assistive  device: Rolling walker Gait Pattern: Step-to pattern Stairs: Yes Stairs Assistance: 4: Min assist Stairs Assistance Details (indicate cue type and reason): cues for sequence Stair Management Technique: One rail Right;Forwards;With crutches Number of Stairs: 4     Exercise  Total Joint Exercises Ankle Circles/Pumps: AROM;Both;15 reps Quad Sets: AROM;Right;10 reps Heel Slides: AAROM;Right;10 reps;Seated Hip ABduction/ADduction: AAROM;Right;10 reps Straight Leg Raises: AAROM;Right;10 reps End of Session PT - End of Session Equipment Utilized During Treatment: Right knee immobilizer Activity Tolerance: Patient tolerated treatment well Patient left: in bed;with call bell in reach;with family/visitor present Nurse Communication:  (ready for D/C) General Behavior During Session: [facility] for tasks performed Cognition: [facility] for tasks performed  [facility] 12/19/2011, 11:37 AM

## 2011-12-19 NOTE — Progress Notes (Signed)
Subjective: 3 Days Post-Op Procedure(s) (LRB): TOTAL KNEE ARTHROPLASTY (Right) Patient reports pain as mild.   Patient seen in rounds by Dr. Lequita Halt. Patient ready to go home.  Objective: Vital signs in last 24 hours: Temp:  [98.4 F (36.9 C)-98.9 F (37.2 C)] 98.4 F (36.9 C) (02/21 0630) Pulse Rate:  [88-112] 88  (02/21 0630) Resp:  [16] 16  (02/21 0630) BP: (130-161)/(77-78) 161/78 mmHg (02/21 0630) SpO2:  [95 %-98 %] 97 % (02/21 0630)  Intake/Output from previous day:  Intake/Output Summary (Last 24 hours) at 12/19/11 1029 Last data filed at 12/19/11 0800  Gross per 24 hour  Intake   1245 ml  Output    400 ml  Net    845 ml    Intake/Output this shift: Total I/O In: 240 [P.O.:240] Out: -   Labs: Results for orders placed during the hospital encounter of 12/16/11  TYPE AND SCREEN      Component Value Range   ABO/RH(D) O POS     Antibody Screen NEG     Sample Expiration 12/19/2011    CBC      Component Value Range   WBC 9.4  4.0 - 10.5 (K/uL)   RBC 3.12 (*) 3.87 - 5.11 (MIL/uL)   Hemoglobin 9.0 (*) 12.0 - 15.0 (g/dL)   HCT 14.7 (*) 82.9 - 46.0 (%)   MCV 84.3  78.0 - 100.0 (fL)   MCH 28.8  26.0 - 34.0 (pg)   MCHC 34.2  30.0 - 36.0 (g/dL)   RDW 56.2  13.0 - 86.5 (%)   Platelets 327  150 - 400 (K/uL)  BASIC METABOLIC PANEL      Component Value Range   Sodium 127 (*) 135 - 145 (mEq/L)   Potassium 3.9  3.5 - 5.1 (mEq/L)   Chloride 96  96 - 112 (mEq/L)   CO2 23  19 - 32 (mEq/L)   Glucose, Bld 162 (*) 70 - 99 (mg/dL)   BUN 14  6 - 23 (mg/dL)   Creatinine, Ser 7.84  0.50 - 1.10 (mg/dL)   Calcium 8.3 (*) 8.4 - 10.5 (mg/dL)   GFR calc non Af Amer 77 (*) >90 (mL/min)   GFR calc Af Amer 90 (*) >90 (mL/min)  CBC      Component Value Range   WBC 11.0 (*) 4.0 - 10.5 (K/uL)   RBC 3.14 (*) 3.87 - 5.11 (MIL/uL)   Hemoglobin 9.2 (*) 12.0 - 15.0 (g/dL)   HCT 69.6 (*) 29.5 - 46.0 (%)   MCV 86.0  78.0 - 100.0 (fL)   MCH 29.3  26.0 - 34.0 (pg)   MCHC 34.1  30.0 - 36.0  (g/dL)   RDW 28.4  13.2 - 44.0 (%)   Platelets 323  150 - 400 (K/uL)  BASIC METABOLIC PANEL      Component Value Range   Sodium 133 (*) 135 - 145 (mEq/L)   Potassium 4.1  3.5 - 5.1 (mEq/L)   Chloride 102  96 - 112 (mEq/L)   CO2 24  19 - 32 (mEq/L)   Glucose, Bld 115 (*) 70 - 99 (mg/dL)   BUN 15  6 - 23 (mg/dL)   Creatinine, Ser 1.02  0.50 - 1.10 (mg/dL)   Calcium 8.4  8.4 - 72.5 (mg/dL)   GFR calc non Af Amer 77 (*) >90 (mL/min)   GFR calc Af Amer 89 (*) >90 (mL/min)  CBC      Component Value Range   WBC 12.3 (*) 4.0 -  10.5 (K/uL)   RBC 3.17 (*) 3.87 - 5.11 (MIL/uL)   Hemoglobin 9.3 (*) 12.0 - 15.0 (g/dL)   HCT 45.4 (*) 09.8 - 46.0 (%)   MCV 84.9  78.0 - 100.0 (fL)   MCH 29.3  26.0 - 34.0 (pg)   MCHC 34.6  30.0 - 36.0 (g/dL)   RDW 11.9  14.7 - 82.9 (%)   Platelets 306  150 - 400 (K/uL)    Exam: Neurovascular intact Sensation intact distally Incision - clean, dry, no drainage Motor function intact - moving foot and toes well on exam.   Assessment/Plan: 3 Days Post-Op Procedure(s) (LRB): TOTAL KNEE ARTHROPLASTY (Right) Procedure(s) (LRB): TOTAL KNEE ARTHROPLASTY (Right) Past Medical History  Diagnosis Date  . Hyperlipidemia   . GERD (gastroesophageal reflux disease)   . Thyroid nodule     with biopsy- states following medically  . Pneumonia     2 years ago/ states occ cough with sinus drainage- no fever  . Hypertension     clearance with note Dr Margo Aye on chart   Principal Problem:  *OA (osteoarthritis) of knee Active Problems:  Postop Hyponatremia  Postop Anemia   Up with therapy Discharge home with home health Diet - heart healthy Follow up - in 2 weeks Activity - WBAT Condition Upon Discharge - Good D/C Meds - See DC Summary DVT Prophylaxis - Xarelto Protocol   [name] 12/19/2011, 10:29 AM

## 2011-12-19 NOTE — Discharge Instructions (Signed)
Knee Rehabilitation, Guidelines Following Surgery Results after knee surgery are often greatly improved when you follow the exercise, range of motion and muscle strengthening exercises prescribed by your doctor. Safety measures are also important to protect the knee from further injury. Any time any of these exercises cause you to have increased pain or swelling in your knee joint, decrease the amount until you are comfortable again and slowly increase them. If you have problems or questions, call your caregiver or physical therapist for advice. HOME CARE INSTRUCTIONS   Remove items at home which could result in a fall. This includes throw rugs or furniture in walking pathways.   Continue medications as instructed.   You may shower or take tub baths when your staples or stitches are removed or as instructed.   Walk using crutches or walker as instructed.   Put weight on your legs and walk as much as is comfortable.   You may resume a sexual relationship in one month or when given the OK by your doctor.   Return to work as instructed by your doctor.   Do not drive a car for 6 weeks or as instructed.   Wear elastic stockings until instructed not to.   Make sure you keep all of your appointments after your operation with all of your doctors and caregivers.  RANGE OF MOTION AND STRENGTHENING EXERCISES Rehabilitation of the knee is important following a knee injury or an operation. After just a few days of immobilization, the muscles of the thigh which control the knee become weakened and shrink (atrophy). Knee exercises are designed to build up the tone and strength of the thigh muscles and to improve knee motion. Often times heat used for twenty to thirty minutes before working out will loosen up your tissues and help with improving the range of motion. These exercises can be done on a training (exercise) mat, on the floor, on a table or on a bed. Use what ever works the best and is most  comfortable for you Knee exercises include:  Leg Lifts - While your knee is still immobilized in a splint or cast, you can do straight leg raises. Lift the leg to 60 degrees, hold for 3 sec, and slowly lower the leg. Repeat 10-20 times 2-3 times daily. Perform this exercise against resistance later as your knee gets better.   Quad and Hamstring Sets - Tighten up the muscle on the front of the thigh (Quad) and hold for 5-10 sec. Repeat this 10-20 times hourly. Hamstring sets are done by pushing the foot backward against an object and holding for 5-10 sec. Repeat as with quad sets.   A rehabilitation program following serious knee injuries can speed recovery and prevent re-injury in the future due to weakened muscles. Contact your doctor or a physical therapist for more information on knee rehabilitation. MAKE SURE YOU:   Understand these instructions.   Will watch your condition.   Will get help right away if you are not doing well or get worse.  Document Released: 10/14/2005 Document Revised: 06/26/2011 Document Reviewed: 04/03/2007 [employer] Patient Information 2012 [employer]  Pick up stool softner and laxative for home. Do not submerge incision under water. May shower. Continue to use ice for pain and swelling from surgery.  

## 2011-12-30 ENCOUNTER — Encounter (HOSPITAL_COMMUNITY): Payer: Self-pay | Admitting: Orthopedic Surgery

## 2011-12-30 NOTE — Discharge Summary (Signed)
Physician Discharge Summary   Patient ID: [name] MRN: [id number] DOB/AGE: 05/21/1930 78 y.o.  Admit date: 12/16/2011 Discharge date: 12/19/2011  Primary Diagnosis: Osteoarthrosis Right Knee   Admission Diagnoses: Past Medical History  Diagnosis Date  . Hyperlipidemia   . GERD (gastroesophageal reflux disease)   . Thyroid nodule     with biopsy- states following medically  . Pneumonia     2 years ago/ states occ cough with sinus drainage- no fever  . Hypertension     clearance with note Dr Margo Aye on chart    Discharge Diagnoses:  Principal Problem:  *OA (osteoarthritis) of knee Active Problems:  Postop Hyponatremia  Postop Anemia   Procedure: Procedure(s) (LRB): TOTAL KNEE ARTHROPLASTY (Right)   Consults: None  HPI:  [name] is a 78 y.o. year old female with end stage OA of her left knee with progressively worsening pain and dysfunction. She has constant pain, with activity and at rest and significant functional deficits with difficulties even with ADLs. She has had extensive non-op management including analgesics, injections of cortisone and viscosupplements, and home exercise program, but remains in significant pain with significant dysfunction. Radiographs show bone on bone arthritis lateral and patellofemoral with large valgus deformity. She presents now for left Total Knee Arthroplasty.      Laboratory Data: Hospital Outpatient Visit on 12/05/2011  Component Date Value Range Status  . aPTT (seconds) 12/05/2011 31  24-37 Final  . WBC (K/uL) 12/05/2011 7.0  4.0-10.5 Final  . RBC (MIL/uL) 12/05/2011 4.07  3.87-5.11 Final  . Hemoglobin (g/dL) 40/98/1191 47.8* 29.5-62.1 Final  . HCT (%) 12/05/2011 35.1* 36.0-46.0 Final  . MCV (fL) 12/05/2011 86.2  78.0-100.0 Final  . MCH (pg) 12/05/2011 29.0  26.0-34.0 Final  . MCHC (g/dL) 30/86/5784 69.6  29.5-28.4 Final  . RDW (%) 12/05/2011 15.5  11.5-15.5 Final  . Platelets (K/uL) 12/05/2011 358  150-400 Final  .  Sodium (mEq/L) 12/05/2011 136  135-145 Final  . Potassium (mEq/L) 12/05/2011 4.4  3.5-5.1 Final  . Chloride (mEq/L) 12/05/2011 101  96-112 Final  . CO2 (mEq/L) 12/05/2011 25  19-32 Final  . Glucose, Bld (mg/dL) 13/24/4010 94  27-25 Final  . BUN (mg/dL) 36/64/4034 23  7-42 Final  . Creatinine, Ser (mg/dL) 59/56/3875 6.43  3.29-5.18 Final  . Calcium (mg/dL) 84/16/6063 9.9  0.1-60.1 Final  . Total Protein (g/dL) 09/32/3557 7.8  3.2-2.0 Final  . Albumin (g/dL) 25/42/7062 4.0  3.7-6.2 Final  . AST (U/L) 12/05/2011 15  0-37 Final  . ALT (U/L) 12/05/2011 14  0-35 Final  . Alkaline Phosphatase (U/L) 12/05/2011 88  39-117 Final  . Total Bilirubin (mg/dL) 83/15/1761 0.4  6.0-7.3 Final  . GFR calc non Af Amer (mL/min) 12/05/2011 58* >90 Final  . GFR calc Af Amer (mL/min) 12/05/2011 67* >90 Final   Comment:                                 The eGFR has been calculated                          using the CKD EPI equation.                          This calculation has not been  validated in all clinical                          situations.                          eGFR's persistently                          <90 mL/min signify                          possible Chronic Kidney Disease.  Marland Kitchen Prothrombin Time (seconds) 12/05/2011 13.6  11.6-15.2 Final  . INR  12/05/2011 1.02  0.00-1.49 Final  . Color, Urine  12/05/2011 YELLOW  YELLOW Final  . APPearance  12/05/2011 CLOUDY* CLEAR Final  . Specific Gravity, Urine  12/05/2011 1.013  1.005-1.030 Final  . pH  12/05/2011 6.5  5.0-8.0 Final  . Glucose, UA (mg/dL) 81/19/1478 NEGATIVE  NEGATIVE Final  . Hgb urine dipstick  12/05/2011 TRACE* NEGATIVE Final  . Bilirubin Urine  12/05/2011 NEGATIVE  NEGATIVE Final  . Ketones, ur (mg/dL) 29/56/2130 NEGATIVE  NEGATIVE Final  . Protein, ur (mg/dL) 86/57/8469 NEGATIVE  NEGATIVE Final  . Urobilinogen, UA (mg/dL) 62/95/2841 0.2  3.2-4.4 Final  . Nitrite  12/05/2011 NEGATIVE  NEGATIVE Final  .  Leukocytes, UA  12/05/2011 LARGE* NEGATIVE Final  . MRSA, PCR  12/05/2011 NEGATIVE  NEGATIVE Final  . Staphylococcus aureus  12/05/2011 NEGATIVE  NEGATIVE Final   Comment:                                 The Xpert SA Assay (FDA                          approved for NASAL specimens                          only), is one component of                          a comprehensive surveillance                          program.  It is not intended                          to diagnose infection nor to                          guide or monitor treatment.  . Squamous Epithelial / LPF  12/05/2011 RARE  RARE Final  . WBC, UA (WBC/hpf) 12/05/2011 21-50  <3 Final  . RBC / HPF (RBC/hpf) 12/05/2011 0-2  <3 Final  . Bacteria, UA  12/05/2011 MANY* RARE Final   No results found for this basename: HGB:5 in the last 72 hours No results found for this basename: WBC:2,RBC:2,HCT:2,PLT:2 in the last 72 hours No results found for this basename: NA:2,K:2,CL:2,CO2:2,BUN:2,CREATININE:2,GLUCOSE:2,CALCIUM:2 in the last 72 hours No results found for this basename: LABPT:2,INR:2 in the last 72 hours  X-Rays:Dg Chest 2 View  12/05/2011  *RADIOLOGY REPORT*  Clinical Data: Preoperative respiratory exam; knee replacement; hypertension  CHEST -  2 VIEW  Comparison: June 10, 2011  Findings: The cardiac silhouette, mediastinum, pulmonary vasculature are within normal limits.  Both lungs are clear. There is no acute bony abnormality.  IMPRESSION: There is no evidence of acute cardiac or pulmonary process.  Original Report Authenticated By: Brandon Melnick, M.D.    EKG: Orders placed during the hospital encounter of 12/16/11  . EKG     Hospital Course: Patient was admitted to [facility] and taken to the OR and underwent the above state procedure without complications.  Patient tolerated the procedure well and was later transferred to the recovery room and then to the orthopaedic floor for postoperative care.  They were  given PO and IV analgesics for pain control following their surgery.  They were given 24 hours of postoperative antibiotics and started on DVT prophylaxis.   PT and OT were ordered for total joint protocol.  Discharge planning consulted to help with postop disposition and equipment needs.  Patient had a rough night on the evening of surgery and started to get up with therapy on day one.  PCA was discontinued and they were weaned over to PO meds.  Hemovac drain was pulled without difficulty.  Continued to progress with therapy into day two walking around 60 feet.  Dressing was changed on day two and the incision was healing well.  By day three, the patient had progressed with therapy and meeting goals.  Incision was healing well.  Patient was seen in rounds and was ready to go home.  Discharge Medications: Prior to Admission medications   Medication Sig Start Date End Date Taking? Authorizing Provider  acetaminophen (TYLENOL) 500 MG tablet Take 500-1,000 mg by mouth every 6 (six) hours as needed. For pain.   Yes Historical Provider, MD  cetirizine (ZYRTEC) 10 MG tablet Take 10 mg by mouth daily as needed. For allergies.   Yes Historical Provider, MD  losartan (COZAAR) 100 MG tablet Take 100 mg by mouth daily at 12 noon.   Yes Historical Provider, MD  simvastatin (ZOCOR) 40 MG tablet Take 40 mg by mouth every evening.   Yes Historical Provider, MD  methocarbamol (ROBAXIN) 500 MG tablet Take 1 tablet (500 mg total) by mouth every 6 (six) hours as needed. 12/19/11 12/29/11  Cortny Bambach, PA  oxyCODONE (OXY IR/ROXICODONE) 5 MG immediate release tablet Take 1-2 tablets (5-10 mg total) by mouth every 3 (three) hours as needed. 12/19/11 12/29/11  Mayuri Staples, PA  polysaccharide iron (NIFEREX) 150 MG CAPS capsule Take 1 capsule (150 mg total) by mouth daily. 12/19/11   Callahan Wild Julien Girt, PA  rivaroxaban (XARELTO) 10 MG TABS tablet Take 1 tablet (10 mg total) by mouth daily with breakfast. 12/19/11    Jinna Weinman Julien Girt, PA    Diet: heart healthy  Activity:WBAT  Follow-up:in 2 weeks  Disposition: home Discharged Condition: good   Discharge Orders    Future Orders Please Complete By Expires   Diet - low sodium heart healthy      Call MD / Call 911      Comments:   If you experience chest pain or shortness of breath, CALL 911 and be transported to the hospital emergency room.  If you develope a fever above 101 F, pus (white drainage) or increased drainage or redness at the wound, or calf pain, call your surgeon's office.   Constipation Prevention      Comments:   Drink plenty of fluids.  Prune juice may be helpful.  You may use  a stool softener, such as Colace (over the counter) 100 mg twice a day.  Use MiraLax (over the counter) for constipation as needed.   Increase activity slowly as tolerated      Weight Bearing as taught in Physical Therapy      Comments:   Use a walker or crutches as instructed.   Discharge instructions      Comments:   Pick up stool softner and laxative for home. Do not submerge incision under water. May shower. Continue to use ice for pain and swelling from surgery.    Driving restrictions      Comments:   No driving   Lifting restrictions      Comments:   No lifting   TED hose      Comments:   Use stockings (TED hose) for 3 weeks on both leg(s).  You may remove them at night for sleeping.   Change dressing      Comments:   Change dressing daily with sterile 4 x 4 inch gauze dressing and apply TED hose.   Do not put a pillow under the knee. Place it under the heel.        Medication List  As of 12/30/2011  8:17 AM   STOP taking these medications         aspirin 325 MG tablet      cholecalciferol 1000 UNITS tablet      VITAMIN E PO         TAKE these medications         acetaminophen 500 MG tablet   Commonly known as: TYLENOL   Take 500-1,000 mg by mouth every 6 (six) hours as needed. For pain.      cetirizine 10 MG tablet    Commonly known as: ZYRTEC   Take 10 mg by mouth daily as needed. For allergies.      losartan 100 MG tablet   Commonly known as: COZAAR   Take 100 mg by mouth daily at 12 noon.      polysaccharide iron 150 MG Caps capsule   Commonly known as: NIFEREX   Take 1 capsule (150 mg total) by mouth daily.      rivaroxaban 10 MG Tabs tablet   Commonly known as: XARELTO   Take 1 tablet (10 mg total) by mouth daily with breakfast.      simvastatin 40 MG tablet   Commonly known as: ZOCOR   Take 40 mg by mouth every evening.           Follow-up Information    Follow up with Loanne Drilling, MD. Schedule an appointment as soon as possible for a visit in 2 weeks.   Contact information:   [facility] [address] [place] [address] [phone]          Signed: Patrica Duel 12/30/2011, 8:17 AM

## 2012-11-17 ENCOUNTER — Encounter (HOSPITAL_COMMUNITY): Payer: Self-pay | Admitting: Pharmacy Technician

## 2012-11-24 ENCOUNTER — Encounter (HOSPITAL_COMMUNITY): Payer: Self-pay

## 2012-11-24 ENCOUNTER — Other Ambulatory Visit: Payer: Self-pay

## 2012-11-24 ENCOUNTER — Encounter (HOSPITAL_COMMUNITY)
Admission: RE | Admit: 2012-11-24 | Discharge: 2012-11-24 | Disposition: A | Discharge status: Home / Self Care | Payer: Medicare Other | Source: Ambulatory Visit | Attending: Ophthalmology | Admitting: Ophthalmology

## 2012-11-24 LAB — BASIC METABOLIC PANEL
BUN: 21 mg/dL (ref 6–23)
CO2: 26 mEq/L (ref 19–32)
Chloride: 102 mEq/L (ref 96–112)
Creatinine, Ser: 0.97 mg/dL (ref 0.50–1.10)

## 2012-11-24 MED ORDER — CYCLOPENTOLATE-PHENYLEPHRINE 0.2-1 % OP SOLN
OPHTHALMIC | Status: AC
  Filled 2012-11-24: qty 2

## 2012-11-24 NOTE — Patient Instructions (Addendum)
Your procedure is scheduled on: 11/30/2012  Report to [facility] at  800    AM.  Call this number if you have problems the morning of surgery: [phone]   Do not eat food or drink liquids :After Midnight.      Take these medicines the morning of surgery with A SIP OF WATER: cozaar   Do not wear jewelry, make-up or nail polish.  Do not wear lotions, powders, or perfumes.   Do not shave 48 hours prior to surgery.  Do not bring valuables to the hospital.  Contacts, dentures or bridgework may not be worn into surgery.  Leave suitcase in the car. After surgery it may be brought to your room.  For patients admitted to the hospital, checkout time is 11:00 AM the day of discharge.   Patients discharged the day of surgery will not be allowed to drive home.  :     Please read over the following fact sheets that you were given: Coughing and Deep Breathing, Surgical Site Infection Prevention, Anesthesia Post-op Instructions and Care and Recovery After Surgery    Cataract A cataract is a clouding of the lens of the eye. When a lens becomes cloudy, vision is reduced based on the degree and nature of the clouding. Many cataracts reduce vision to some degree. Some cataracts make people more near-sighted as they develop. Other cataracts increase glare. Cataracts that are ignored and become worse can sometimes look white. The white color can be seen through the pupil. CAUSES   Aging. However, cataracts may occur at any age, even in newborns.   Certain drugs.   Trauma to the eye.   Certain diseases such as diabetes.   Specific eye diseases such as chronic inflammation inside the eye or a sudden attack of a rare form of glaucoma.   Inherited or acquired medical problems.  SYMPTOMS   Gradual, progressive drop in vision in the affected eye.   Severe, rapid visual loss. This most often happens when trauma is the cause.  DIAGNOSIS  To detect a cataract, an eye doctor examines the lens. Cataracts  are best diagnosed with an exam of the eyes with the pupils enlarged (dilated) by drops.  TREATMENT  For an early cataract, vision may improve by using different eyeglasses or stronger lighting. If that does not help your vision, surgery is the only effective treatment. A cataract needs to be surgically removed when vision loss interferes with your everyday activities, such as driving, reading, or watching TV. A cataract may also have to be removed if it prevents examination or treatment of another eye problem. Surgery removes the cloudy lens and usually replaces it with a substitute lens (intraocular lens, IOL).  At a time when both you and your doctor agree, the cataract will be surgically removed. If you have cataracts in both eyes, only one is usually removed at a time. This allows the operated eye to heal and be out of danger from any possible problems after surgery (such as infection or poor wound healing). In rare cases, a cataract may be doing damage to your eye. In these cases, your caregiver may advise surgical removal right away. The vast majority of people who have cataract surgery have better vision afterward. HOME CARE INSTRUCTIONS  If you are not planning surgery, you may be asked to do the following:  Use different eyeglasses.   Use stronger or brighter lighting.   Ask your eye doctor about reducing your medicine dose or changing  medicines if it is thought that a medicine caused your cataract. Changing medicines does not make the cataract go away on its own.   Become familiar with your surroundings. Poor vision can lead to injury. Avoid bumping into things on the affected side. You are at a higher risk for tripping or falling.   Exercise extreme care when driving or operating machinery.   Wear sunglasses if you are sensitive to bright light or experiencing problems with glare.  SEEK IMMEDIATE MEDICAL CARE IF:   You have a worsening or sudden vision loss.   You notice redness,  swelling, or increasing pain in the eye.   You have a fever.  Document Released: 10/14/2005 Document Revised: 10/03/2011 Document Reviewed: 06/07/2011 [facility] Patient Information 2012 [place], [place].PATIENT INSTRUCTIONS POST-ANESTHESIA  IMMEDIATELY FOLLOWING SURGERY:  Do not drive or operate machinery for the first twenty four hours after surgery.  Do not make any important decisions for twenty four hours after surgery or while taking narcotic pain medications or sedatives.  If you develop intractable nausea and vomiting or a severe headache please notify your doctor immediately.  FOLLOW-UP:  Please make an appointment with your surgeon as instructed. You do not need to follow up with anesthesia unless specifically instructed to do so.  WOUND CARE INSTRUCTIONS (if applicable):  Keep a dry clean dressing on the anesthesia/puncture wound site if there is drainage.  Once the wound has quit draining you may leave it open to air.  Generally you should leave the bandage intact for twenty four hours unless there is drainage.  If the epidural site drains for more than 36-48 hours please call the anesthesia department.  QUESTIONS?:  Please feel free to call your physician or the hospital operator if you have any questions, and they will be happy to assist you.

## 2012-11-30 ENCOUNTER — Ambulatory Visit (HOSPITAL_COMMUNITY)
Admission: RE | Admit: 2012-11-30 | Discharge: 2012-11-30 | Disposition: A | Discharge status: Home / Self Care | Payer: Medicare Other | Source: Ambulatory Visit | Attending: Ophthalmology | Admitting: Ophthalmology

## 2012-11-30 ENCOUNTER — Encounter (HOSPITAL_COMMUNITY): Payer: Self-pay

## 2012-11-30 ENCOUNTER — Encounter (HOSPITAL_COMMUNITY): Payer: Self-pay | Admitting: Anesthesiology

## 2012-11-30 ENCOUNTER — Ambulatory Visit (HOSPITAL_COMMUNITY): Payer: Medicare Other | Admitting: Anesthesiology

## 2012-11-30 ENCOUNTER — Encounter (HOSPITAL_COMMUNITY)
Admission: RE | Disposition: A | Discharge status: Home / Self Care | Payer: Self-pay | Source: Ambulatory Visit | Attending: Ophthalmology

## 2012-11-30 DIAGNOSIS — I10 Essential (primary) hypertension: Secondary | ICD-10-CM | POA: Insufficient documentation

## 2012-11-30 DIAGNOSIS — Z01812 Encounter for preprocedural laboratory examination: Secondary | ICD-10-CM | POA: Insufficient documentation

## 2012-11-30 DIAGNOSIS — Z01810 Encounter for preprocedural cardiovascular examination: Secondary | ICD-10-CM | POA: Insufficient documentation

## 2012-11-30 DIAGNOSIS — H2510 Age-related nuclear cataract, unspecified eye: Secondary | ICD-10-CM | POA: Insufficient documentation

## 2012-11-30 HISTORY — PX: CATARACT EXTRACTION W/PHACO: SHX586

## 2012-11-30 SURGERY — PHACOEMULSIFICATION, CATARACT, WITH IOL INSERTION
Anesthesia: Monitor Anesthesia Care | Site: Eye | Laterality: Left | Wound class: Clean

## 2012-11-30 MED ORDER — TETRACAINE 0.5 % OP SOLN OPTIME - NO CHARGE
OPHTHALMIC | Status: DC | PRN
  Administered 2012-11-30: 1 [drp] via OPHTHALMIC

## 2012-11-30 MED ORDER — MIDAZOLAM HCL 2 MG/2ML IJ SOLN
1.0000 mg | INTRAMUSCULAR | Status: DC | PRN
  Administered 2012-11-30 (×2): 2 mg via INTRAVENOUS

## 2012-11-30 MED ORDER — GATIFLOXACIN 0.5 % OP SOLN OPTIME - NO CHARGE
OPHTHALMIC | Status: DC | PRN
  Administered 2012-11-30: 2 [drp] via OPHTHALMIC

## 2012-11-30 MED ORDER — MIDAZOLAM HCL 2 MG/2ML IJ SOLN
INTRAMUSCULAR | Status: AC
  Filled 2012-11-30: qty 2

## 2012-11-30 MED ORDER — LACTATED RINGERS IV SOLN
INTRAVENOUS | Status: DC
  Administered 2012-11-30: 08:00:00 via INTRAVENOUS

## 2012-11-30 MED ORDER — LIDOCAINE HCL 3.5 % OP GEL
OPHTHALMIC | Status: DC | PRN
  Administered 2012-11-30: 1 via OPHTHALMIC

## 2012-11-30 MED ORDER — EPINEPHRINE HCL 1 MG/ML IJ SOLN
INTRAMUSCULAR | Status: AC
  Filled 2012-11-30: qty 1

## 2012-11-30 MED ORDER — FENTANYL CITRATE 0.05 MG/ML IJ SOLN: 25.0000 ug | INTRAMUSCULAR | Status: DC | PRN

## 2012-11-30 MED ORDER — ONDANSETRON HCL 4 MG/2ML IJ SOLN: 4.0000 mg | Freq: Once | INTRAMUSCULAR | Status: DC | PRN

## 2012-11-30 MED ORDER — GATIFLOXACIN 0.5 % OP SOLN OPTIME - NO CHARGE
1.0000 [drp] | Freq: Once | OPHTHALMIC | Status: AC
  Administered 2012-11-30: 1 [drp] via OPHTHALMIC
  Filled 2012-11-30: qty 2.5

## 2012-11-30 MED ORDER — EPINEPHRINE HCL 1 MG/ML IJ SOLN
INTRAOCULAR | Status: DC | PRN
  Administered 2012-11-30: 10:00:00

## 2012-11-30 MED ORDER — BSS IO SOLN
INTRAOCULAR | Status: DC | PRN
  Administered 2012-11-30: 15 mL via INTRAOCULAR

## 2012-11-30 MED ORDER — TETRACAINE HCL 0.5 % OP SOLN: 1.0000 [drp] | OPHTHALMIC | Status: DC

## 2012-11-30 MED ORDER — MOXIFLOXACIN HCL 0.5 % OP SOLN - NO CHARGE: 1.0000 [drp] | Freq: Once | OPHTHALMIC | Status: DC

## 2012-11-30 MED ORDER — TETRACAINE HCL 0.5 % OP SOLN
OPHTHALMIC | Status: AC
  Filled 2012-11-30: qty 2

## 2012-11-30 MED ORDER — LIDOCAINE HCL 3.5 % OP GEL
OPHTHALMIC | Status: AC
  Filled 2012-11-30: qty 5

## 2012-11-30 MED ORDER — NA HYALUR & NA CHOND-NA HYALUR 0.55-0.5 ML IO KIT
PACK | INTRAOCULAR | Status: DC | PRN
  Administered 2012-11-30: 1 via OPHTHALMIC

## 2012-11-30 MED ORDER — KETOROLAC TROMETHAMINE 0.4 % OP SOLN - NO CHARGE
1.0000 [drp] | Freq: Once | OPHTHALMIC | Status: AC
  Administered 2012-11-30: 1 [drp] via OPHTHALMIC
  Filled 2012-11-30: qty 5

## 2012-11-30 MED ORDER — CYCLOPENTOLATE-PHENYLEPHRINE 0.2-1 % OP SOLN
1.0000 [drp] | OPHTHALMIC | Status: AC
  Administered 2012-11-30: 1 [drp] via OPHTHALMIC

## 2012-11-30 SURGICAL SUPPLY — 27 items
CAPSULAR TENSION RING-AMO (OPHTHALMIC RELATED) IMPLANT
CLOTH BEACON ORANGE TIMEOUT ST (SAFETY) ×2 IMPLANT
GLOVE BIO SURGEON STRL SZ7.5 (GLOVE) ×2 IMPLANT
GLOVE BIOGEL M 6.5 STRL (GLOVE) IMPLANT
GLOVE BIOGEL PI IND STRL 6.5 (GLOVE) IMPLANT
GLOVE BIOGEL PI IND STRL 7.0 (GLOVE) ×1 IMPLANT
GLOVE BIOGEL PI INDICATOR 6.5 (GLOVE)
GLOVE BIOGEL PI INDICATOR 7.0 (GLOVE) ×1
GLOVE ECLIPSE 6.5 STRL STRAW (GLOVE) IMPLANT
GLOVE ECLIPSE 7.5 STRL STRAW (GLOVE) IMPLANT
GLOVE EXAM NITRILE LRG STRL (GLOVE) IMPLANT
GLOVE EXAM NITRILE MD LF STRL (GLOVE) ×2 IMPLANT
GLOVE SKINSENSE NS SZ6.5 (GLOVE)
GLOVE SKINSENSE NS SZ7.0 (GLOVE)
GLOVE SKINSENSE STRL SZ6.5 (GLOVE) IMPLANT
GLOVE SKINSENSE STRL SZ7.0 (GLOVE) IMPLANT
INST SET CATARACT ~~LOC~~ (KITS) ×2 IMPLANT
KIT VITRECTOMY (OPHTHALMIC RELATED) IMPLANT
PAD ARMBOARD 7.5X6 YLW CONV (MISCELLANEOUS) ×2 IMPLANT
PROC W NO LENS (INTRAOCULAR LENS)
PROC W SPEC LENS (INTRAOCULAR LENS)
PROCESS W NO LENS (INTRAOCULAR LENS) IMPLANT
PROCESS W SPEC LENS (INTRAOCULAR LENS) IMPLANT
RING MALYGIN (MISCELLANEOUS) IMPLANT
SIGHTPATH CAT PROC W REG LENS (Ophthalmic Related) ×2 IMPLANT
VISCOELASTIC ADDITIONAL (OPHTHALMIC RELATED) IMPLANT
WATER STERILE IRR 250ML POUR (IV SOLUTION) ×2 IMPLANT

## 2012-11-30 NOTE — Addendum Note (Signed)
Addendum  created 11/30/12 1059 by Roselie Awkward, MD   Modules edited:Anesthesia Attestations

## 2012-11-30 NOTE — Op Note (Signed)
See scanned op noote

## 2012-11-30 NOTE — Transfer of Care (Signed)
Immediate Anesthesia Transfer of Care Note  Patient: [name]  Procedure(s) Performed: Procedure(s) (LRB) with comments: CATARACT EXTRACTION PHACO AND INTRAOCULAR LENS PLACEMENT (IOC) (Left) - CDE:11.49  Patient Location: PACU and Short Stay  Anesthesia Type:MAC  Level of Consciousness: awake  Airway & Oxygen Therapy: Patient Spontanous Breathing  Post-op Assessment: Report given to PACU RN  Post vital signs: Reviewed and stable  Complications: No apparent anesthesia complications

## 2012-11-30 NOTE — Anesthesia Preprocedure Evaluation (Signed)
Anesthesia Evaluation  Patient identified by MRN, date of birth, ID band Patient awake    Reviewed: Allergy & Precautions, H&P , NPO status , Patient's Chart, lab work & pertinent test results  Airway Mallampati: I TM Distance: >3 FB Neck ROM: Full    Dental No notable dental hx. (+) Teeth Intact   Pulmonary pneumonia -,    Pulmonary exam normal       Cardiovascular hypertension, Rhythm:Regular Rate:Normal     Neuro/Psych negative neurological ROS  negative psych ROS   GI/Hepatic Neg liver ROS, GERD-  ,  Endo/Other    Renal/GU negative Renal ROS     Musculoskeletal   Abdominal Normal abdominal exam  (+)   Peds  Hematology  (+) Blood dyscrasia, anemia ,   Anesthesia Other Findings   Reproductive/Obstetrics                           Anesthesia Physical Anesthesia Plan  ASA: II  Anesthesia Plan: MAC   Post-op Pain Management:    Induction: Intravenous  Airway Management Planned: Nasal Cannula  Additional Equipment:   Intra-op Plan:   Post-operative Plan:   Informed Consent: I have reviewed the patients History and Physical, chart, labs and discussed the procedure including the risks, benefits and alternatives for the proposed anesthesia with the patient or authorized representative who has indicated his/her understanding and acceptance.     Plan Discussed with: CRNA  Anesthesia Plan Comments:         Anesthesia Quick Evaluation

## 2012-11-30 NOTE — Anesthesia Postprocedure Evaluation (Signed)
  Anesthesia Post-op Note  Patient: [name]  Procedure(s) Performed: Procedure(s) (LRB) with comments: CATARACT EXTRACTION PHACO AND INTRAOCULAR LENS PLACEMENT (IOC) (Left) - CDE:11.49  Patient Location: PACU and Short Stay  Anesthesia Type:MAC  Level of Consciousness: awake, alert  and oriented  Airway and Oxygen Therapy: Patient Spontanous Breathing  Post-op Pain: none  Post-op Assessment: Post-op Vital signs reviewed, Patient's Cardiovascular Status Stable, Respiratory Function Stable, Patent Airway and No signs of Nausea or vomiting  Post-op Vital Signs: Reviewed and stable  Complications: No apparent anesthesia complications

## 2012-11-30 NOTE — H&P (Signed)
I have reviewed the pre printed H&P, the patient was re-examined, and I have identified no significant interval changes in the patient's medical condition.  There is no change in the plan of care since the history and physical of record. 

## 2012-11-30 NOTE — Addendum Note (Signed)
Addendum  created 11/30/12 1059 by Dewie Ahart, MD   Modules edited:Anesthesia Attestations    

## 2012-11-30 NOTE — Brief Op Note (Signed)
11/30/2012  10:54 AM  PATIENT:  [name]  79 y.o. female  PRE-OPERATIVE DIAGNOSIS:  nuclear cataract left eye  POST-OPERATIVE DIAGNOSIS:  nuclear cataract left eye  PROCEDURE:  Procedure(s): CATARACT EXTRACTION PHACO AND INTRAOCULAR LENS PLACEMENT (IOC)  SURGEON:  Surgeon(s): Susa Simmonds, MD  ASSISTANTS: Valda Lamb, CST   ANESTHESIA STAFF: Moshe Salisbury, CRNA - CRNA Roselie Awkward, MD - Anesthesiologist  ANESTHESIA:   topical and MAC  REQUESTED LENS POWER: 20.0  LENS IMPLANT INFORMATION: Alcon SN60WF  S/n [id number]  Exp 09/2016  CUMULATIVE DISSIPATED ENERGY:11.49  INDICATIONS:see H&P for specific indications  OP FINDINGS:dense NS  COMPLICATIONS:None  DICTATION #: see scanned op note  PLAN OF CARE: KPE w IOL OS  PATIENT DISPOSITION:  Short Stay

## 2012-12-01 ENCOUNTER — Encounter (HOSPITAL_COMMUNITY): Payer: Self-pay | Admitting: Ophthalmology

## 2013-02-26 ENCOUNTER — Encounter (HOSPITAL_COMMUNITY): Payer: Self-pay | Admitting: Pharmacy Technician

## 2013-03-08 NOTE — Patient Instructions (Addendum)
Your procedure is scheduled on: 03/15/2013  Report to [facility] at  615   AM.  Call this number if you have problems the morning of surgery: [phone]   Do not eat food or drink liquids :After Midnight.      Take these medicines the morning of surgery with A SIP OF WATER: cozaar   Do not wear jewelry, make-up or nail polish.  Do not wear lotions, powders, or perfumes.   Do not shave 48 hours prior to surgery.  Do not bring valuables to the hospital.  Contacts, dentures or bridgework may not be worn into surgery.  Leave suitcase in the car. After surgery it may be brought to your room.  For patients admitted to the hospital, checkout time is 11:00 AM the day of discharge.   Patients discharged the day of surgery will not be allowed to drive home.  :     Please read over the following fact sheets that you were given: Coughing and Deep Breathing, Surgical Site Infection Prevention, Anesthesia Post-op Instructions and Care and Recovery After Surgery    Cataract A cataract is a clouding of the lens of the eye. When a lens becomes cloudy, vision is reduced based on the degree and nature of the clouding. Many cataracts reduce vision to some degree. Some cataracts make people more near-sighted as they develop. Other cataracts increase glare. Cataracts that are ignored and become worse can sometimes look white. The white color can be seen through the pupil. CAUSES   Aging. However, cataracts may occur at any age, even in newborns.   Certain drugs.   Trauma to the eye.   Certain diseases such as diabetes.   Specific eye diseases such as chronic inflammation inside the eye or a sudden attack of a rare form of glaucoma.   Inherited or acquired medical problems.  SYMPTOMS   Gradual, progressive drop in vision in the affected eye.   Severe, rapid visual loss. This most often happens when trauma is the cause.  DIAGNOSIS  To detect a cataract, an eye doctor examines the lens. Cataracts  are best diagnosed with an exam of the eyes with the pupils enlarged (dilated) by drops.  TREATMENT  For an early cataract, vision may improve by using different eyeglasses or stronger lighting. If that does not help your vision, surgery is the only effective treatment. A cataract needs to be surgically removed when vision loss interferes with your everyday activities, such as driving, reading, or watching TV. A cataract may also have to be removed if it prevents examination or treatment of another eye problem. Surgery removes the cloudy lens and usually replaces it with a substitute lens (intraocular lens, IOL).  At a time when both you and your doctor agree, the cataract will be surgically removed. If you have cataracts in both eyes, only one is usually removed at a time. This allows the operated eye to heal and be out of danger from any possible problems after surgery (such as infection or poor wound healing). In rare cases, a cataract may be doing damage to your eye. In these cases, your caregiver may advise surgical removal right away. The vast majority of people who have cataract surgery have better vision afterward. HOME CARE INSTRUCTIONS  If you are not planning surgery, you may be asked to do the following:  Use different eyeglasses.   Use stronger or brighter lighting.   Ask your eye doctor about reducing your medicine dose or changing medicines  if it is thought that a medicine caused your cataract. Changing medicines does not make the cataract go away on its own.   Become familiar with your surroundings. Poor vision can lead to injury. Avoid bumping into things on the affected side. You are at a higher risk for tripping or falling.   Exercise extreme care when driving or operating machinery.   Wear sunglasses if you are sensitive to bright light or experiencing problems with glare.  SEEK IMMEDIATE MEDICAL CARE IF:   You have a worsening or sudden vision loss.   You notice redness,  swelling, or increasing pain in the eye.   You have a fever.  Document Released: 10/14/2005 Document Revised: 10/03/2011 Document Reviewed: 06/07/2011 [facility] Patient Information 2012 [place].PATIENT INSTRUCTIONS POST-ANESTHESIA  IMMEDIATELY FOLLOWING SURGERY:  Do not drive or operate machinery for the first twenty four hours after surgery.  Do not make any important decisions for twenty four hours after surgery or while taking narcotic pain medications or sedatives.  If you develop intractable nausea and vomiting or a severe headache please notify your doctor immediately.  FOLLOW-UP:  Please make an appointment with your surgeon as instructed. You do not need to follow up with anesthesia unless specifically instructed to do so.  WOUND CARE INSTRUCTIONS (if applicable):  Keep a dry clean dressing on the anesthesia/puncture wound site if there is drainage.  Once the wound has quit draining you may leave it open to air.  Generally you should leave the bandage intact for twenty four hours unless there is drainage.  If the epidural site drains for more than 36-48 hours please call the anesthesia department.  QUESTIONS?:  Please feel free to call your physician or the hospital operator if you have any questions, and they will be happy to assist you.

## 2013-03-09 ENCOUNTER — Encounter (HOSPITAL_COMMUNITY): Payer: Self-pay

## 2013-03-09 ENCOUNTER — Encounter (HOSPITAL_COMMUNITY)
Admission: RE | Admit: 2013-03-09 | Discharge: 2013-03-09 | Disposition: A | Discharge status: Home / Self Care | Payer: Medicare Other | Source: Ambulatory Visit | Attending: Ophthalmology | Admitting: Ophthalmology

## 2013-03-09 MED ORDER — CYCLOPENTOLATE-PHENYLEPHRINE 0.2-1 % OP SOLN
OPHTHALMIC | Status: AC
  Filled 2013-03-09: qty 2

## 2013-03-15 ENCOUNTER — Ambulatory Visit (HOSPITAL_COMMUNITY)
Admission: RE | Admit: 2013-03-15 | Discharge: 2013-03-15 | Disposition: A | Discharge status: Home / Self Care | Payer: Medicare Other | Source: Ambulatory Visit | Attending: Ophthalmology | Admitting: Ophthalmology

## 2013-03-15 ENCOUNTER — Encounter (HOSPITAL_COMMUNITY): Payer: Self-pay

## 2013-03-15 ENCOUNTER — Encounter (HOSPITAL_COMMUNITY)
Admission: RE | Disposition: A | Discharge status: Home / Self Care | Payer: Self-pay | Source: Ambulatory Visit | Attending: Ophthalmology

## 2013-03-15 ENCOUNTER — Encounter (HOSPITAL_COMMUNITY): Payer: Self-pay | Admitting: Anesthesiology

## 2013-03-15 ENCOUNTER — Ambulatory Visit (HOSPITAL_COMMUNITY): Payer: Medicare Other | Admitting: Anesthesiology

## 2013-03-15 DIAGNOSIS — I10 Essential (primary) hypertension: Secondary | ICD-10-CM | POA: Insufficient documentation

## 2013-03-15 DIAGNOSIS — H2510 Age-related nuclear cataract, unspecified eye: Secondary | ICD-10-CM | POA: Insufficient documentation

## 2013-03-15 DIAGNOSIS — Z01812 Encounter for preprocedural laboratory examination: Secondary | ICD-10-CM | POA: Insufficient documentation

## 2013-03-15 HISTORY — PX: CATARACT EXTRACTION W/PHACO: SHX586

## 2013-03-15 SURGERY — PHACOEMULSIFICATION, CATARACT, WITH IOL INSERTION
Anesthesia: Monitor Anesthesia Care | Site: Eye | Laterality: Right | Wound class: Clean

## 2013-03-15 MED ORDER — LIDOCAINE 3.5 % OP GEL OPTIME - NO CHARGE
OPHTHALMIC | Status: DC | PRN
  Administered 2013-03-15: 2 [drp] via OPHTHALMIC

## 2013-03-15 MED ORDER — CYCLOPENTOLATE-PHENYLEPHRINE 0.2-1 % OP SOLN
OPHTHALMIC | Status: AC
Start: 2013-03-15 — End: 2013-03-15
  Filled 2013-03-15: qty 2

## 2013-03-15 MED ORDER — GATIFLOXACIN 0.5 % OP SOLN OPTIME - NO CHARGE
1.0000 [drp] | Freq: Once | OPHTHALMIC | Status: AC
  Administered 2013-03-15: 1 [drp] via OPHTHALMIC

## 2013-03-15 MED ORDER — BSS IO SOLN
INTRAOCULAR | Status: DC | PRN
  Administered 2013-03-15: 15 mL via INTRAOCULAR

## 2013-03-15 MED ORDER — MIDAZOLAM HCL 2 MG/2ML IJ SOLN
1.0000 mg | INTRAMUSCULAR | Status: DC | PRN
  Administered 2013-03-15: 2 mg via INTRAVENOUS

## 2013-03-15 MED ORDER — LIDOCAINE HCL 3.5 % OP GEL
OPHTHALMIC | Status: AC
  Filled 2013-03-15: qty 5

## 2013-03-15 MED ORDER — MOXIFLOXACIN HCL 0.5 % OP SOLN - NO CHARGE
1.0000 [drp] | Freq: Once | OPHTHALMIC | Status: AC
  Administered 2013-03-15: 1 [drp] via OPHTHALMIC

## 2013-03-15 MED ORDER — LACTATED RINGERS IV SOLN
INTRAVENOUS | Status: DC
  Administered 2013-03-15: 07:00:00 via INTRAVENOUS

## 2013-03-15 MED ORDER — EPINEPHRINE HCL 1 MG/ML IJ SOLN
INTRAOCULAR | Status: DC | PRN
  Administered 2013-03-15: 08:00:00

## 2013-03-15 MED ORDER — EPINEPHRINE HCL 1 MG/ML IJ SOLN
INTRAMUSCULAR | Status: AC
  Filled 2013-03-15: qty 1

## 2013-03-15 MED ORDER — NA HYALUR & NA CHOND-NA HYALUR 0.55-0.5 ML IO KIT
PACK | INTRAOCULAR | Status: DC | PRN
  Administered 2013-03-15: 1 via OPHTHALMIC

## 2013-03-15 MED ORDER — CYCLOPENTOLATE-PHENYLEPHRINE 0.2-1 % OP SOLN
1.0000 [drp] | Freq: Once | OPHTHALMIC | Status: AC
  Administered 2013-03-15: 1 [drp] via OPHTHALMIC

## 2013-03-15 MED ORDER — TETRACAINE 0.5 % OP SOLN OPTIME - NO CHARGE
OPHTHALMIC | Status: DC | PRN
  Administered 2013-03-15: 2 [drp] via OPHTHALMIC

## 2013-03-15 MED ORDER — MIDAZOLAM HCL 2 MG/2ML IJ SOLN
INTRAMUSCULAR | Status: AC
  Filled 2013-03-15: qty 2

## 2013-03-15 MED ORDER — TETRACAINE HCL 0.5 % OP SOLN
OPHTHALMIC | Status: AC
  Filled 2013-03-15: qty 2

## 2013-03-15 MED ORDER — KETOROLAC TROMETHAMINE 0.4 % OP SOLN - NO CHARGE
1.0000 [drp] | Freq: Once | OPHTHALMIC | Status: AC
  Administered 2013-03-15: 1 [drp] via OPHTHALMIC

## 2013-03-15 SURGICAL SUPPLY — 27 items
CAPSULAR TENSION RING-AMO (OPHTHALMIC RELATED) IMPLANT
CLOTH BEACON ORANGE TIMEOUT ST (SAFETY) ×2 IMPLANT
GLOVE BIO SURGEON STRL SZ7.5 (GLOVE) IMPLANT
GLOVE BIOGEL M 6.5 STRL (GLOVE) IMPLANT
GLOVE BIOGEL PI IND STRL 6.5 (GLOVE) ×1 IMPLANT
GLOVE BIOGEL PI IND STRL 7.0 (GLOVE) ×1 IMPLANT
GLOVE BIOGEL PI INDICATOR 6.5 (GLOVE) ×1
GLOVE BIOGEL PI INDICATOR 7.0 (GLOVE) ×1
GLOVE ECLIPSE 6.5 STRL STRAW (GLOVE) IMPLANT
GLOVE ECLIPSE 7.5 STRL STRAW (GLOVE) IMPLANT
GLOVE EXAM NITRILE LRG STRL (GLOVE) IMPLANT
GLOVE EXAM NITRILE MD LF STRL (GLOVE) IMPLANT
GLOVE SKINSENSE NS SZ6.5 (GLOVE)
GLOVE SKINSENSE NS SZ7.0 (GLOVE)
GLOVE SKINSENSE STRL SZ6.5 (GLOVE) IMPLANT
GLOVE SKINSENSE STRL SZ7.0 (GLOVE) IMPLANT
INST SET CATARACT ~~LOC~~ (KITS) ×2 IMPLANT
KIT VITRECTOMY (OPHTHALMIC RELATED) IMPLANT
PAD ARMBOARD 7.5X6 YLW CONV (MISCELLANEOUS) ×2 IMPLANT
PROC W NO LENS (INTRAOCULAR LENS)
PROC W SPEC LENS (INTRAOCULAR LENS)
PROCESS W NO LENS (INTRAOCULAR LENS) IMPLANT
PROCESS W SPEC LENS (INTRAOCULAR LENS) IMPLANT
RING MALYGIN (MISCELLANEOUS) IMPLANT
SIGHTPATH CAT PROC W REG LENS (Ophthalmic Related) ×2 IMPLANT
VISCOELASTIC ADDITIONAL (OPHTHALMIC RELATED) IMPLANT
WATER STERILE IRR 250ML POUR (IV SOLUTION) ×2 IMPLANT

## 2013-03-15 NOTE — Anesthesia Procedure Notes (Signed)
Procedure Name: MAC Date/Time: 03/15/2013 7:50 AM Performed by: Franco Nones Pre-anesthesia Checklist: Patient identified, Emergency Drugs available, Suction available, Timeout performed and Patient being monitored Patient Re-evaluated:Patient Re-evaluated prior to inductionOxygen Delivery Method: Nasal Cannula

## 2013-03-15 NOTE — Brief Op Note (Signed)
03/15/2013  8:41 AM  PATIENT:  [name]  79 y.o. female  PRE-OPERATIVE DIAGNOSIS:  nuclear cataract right eye  POST-OPERATIVE DIAGNOSIS:  nuclear cataract right eye  PROCEDURE:  Procedure(s): CATARACT EXTRACTION PHACO AND INTRAOCULAR LENS PLACEMENT (IOC)  SURGEON:  Surgeon(s): Susa Simmonds, MD  ASSISTANTS:  Marya Landry, CST  ANESTHESIA STAFF: Anesthesiologist: Laurene Footman, MD CRNA: Franco Nones, CRNA  ANESTHESIA:   topical and MAC  REQUESTED LENS POWER: 19.5  LENS IMPLANT INFORMATION:  Alcon SN60WF  S/[id number]  Exp 06/2017  CUMULATIVE DISSIPATED ENERGY:16.15  INDICATIONS:see office H&P for particulars  OP FINDINGS:dense NS  COMPLICATIONS:None  DICTATION #: see scanned op note done today  PLAN OF CARE: KPE w IOL OD today  PATIENT DISPOSITION:  Short Stay

## 2013-03-15 NOTE — Op Note (Signed)
See scanned note.

## 2013-03-15 NOTE — Transfer of Care (Signed)
Immediate Anesthesia Transfer of Care Note  Patient: [name]  Procedure(s) Performed: Procedure(s) (LRB): CATARACT EXTRACTION PHACO AND INTRAOCULAR LENS PLACEMENT (IOC) (Right)  Patient Location: Shortstay  Anesthesia Type: MAC  Level of Consciousness: awake  Airway & Oxygen Therapy: Patient Spontanous Breathing   Post-op Assessment: Report given to PACU RN, Post -op Vital signs reviewed and stable and Patient moving all extremities  Post vital signs: Reviewed and stable  Complications: No apparent anesthesia complications

## 2013-03-15 NOTE — Anesthesia Postprocedure Evaluation (Signed)
  Anesthesia Post-op Note  Patient: [name]  Procedure(s) Performed: Procedure(s) (LRB): CATARACT EXTRACTION PHACO AND INTRAOCULAR LENS PLACEMENT (IOC) (Right)  Patient Location:  Short Stay  Anesthesia Type: MAC  Level of Consciousness: awake  Airway and Oxygen Therapy: Patient Spontanous Breathing  Post-op Pain: none  Post-op Assessment: Post-op Vital signs reviewed, Patient's Cardiovascular Status Stable, Respiratory Function Stable, Patent Airway, No signs of Nausea or vomiting and Pain level controlled  Post-op Vital Signs: Reviewed and stable  Complications: No apparent anesthesia complications

## 2013-03-15 NOTE — H&P (Signed)
I have reviewed the pre printed H&P, the patient was re-examined, and I have identified no significant interval changes in the patient's medical condition.  There is no change in the plan of care since the history and physical of record. 

## 2013-03-15 NOTE — Anesthesia Preprocedure Evaluation (Signed)
Anesthesia Evaluation  Patient identified by MRN, date of birth, ID band Patient awake    Reviewed: Allergy & Precautions, H&P , NPO status , Patient's Chart, lab work & pertinent test results  Airway Mallampati: I TM Distance: >3 FB Neck ROM: Full    Dental no notable dental hx. (+) Teeth Intact   Pulmonary pneumonia -,    Pulmonary exam normal       Cardiovascular hypertension, Rhythm:Regular Rate:Normal     Neuro/Psych negative neurological ROS  negative psych ROS   GI/Hepatic Neg liver ROS, GERD-  ,  Endo/Other    Renal/GU negative Renal ROS     Musculoskeletal   Abdominal Normal abdominal exam  (+)   Peds  Hematology  (+) Blood dyscrasia, anemia ,   Anesthesia Other Findings   Reproductive/Obstetrics                           Anesthesia Physical Anesthesia Plan  ASA: II  Anesthesia Plan: MAC   Post-op Pain Management:    Induction: Intravenous  Airway Management Planned: Nasal Cannula  Additional Equipment:   Intra-op Plan:   Post-operative Plan:   Informed Consent: I have reviewed the patients History and Physical, chart, labs and discussed the procedure including the risks, benefits and alternatives for the proposed anesthesia with the patient or authorized representative who has indicated his/her understanding and acceptance.     Plan Discussed with: CRNA  Anesthesia Plan Comments:         Anesthesia Quick Evaluation

## 2013-03-16 ENCOUNTER — Encounter (HOSPITAL_COMMUNITY): Payer: Self-pay | Admitting: Ophthalmology

## 2013-12-04 ENCOUNTER — Emergency Department (HOSPITAL_COMMUNITY): Payer: Medicare HMO

## 2013-12-04 ENCOUNTER — Encounter (HOSPITAL_COMMUNITY): Payer: Self-pay | Admitting: Emergency Medicine

## 2013-12-04 ENCOUNTER — Emergency Department (HOSPITAL_COMMUNITY)
Admission: EM | Admit: 2013-12-04 | Discharge: 2013-12-04 | Disposition: A | Discharge status: Home / Self Care | Payer: Medicare HMO | Attending: Emergency Medicine | Admitting: Emergency Medicine

## 2013-12-04 DIAGNOSIS — Z8701 Personal history of pneumonia (recurrent): Secondary | ICD-10-CM | POA: Insufficient documentation

## 2013-12-04 DIAGNOSIS — Z880 Allergy status to penicillin: Secondary | ICD-10-CM | POA: Insufficient documentation

## 2013-12-04 DIAGNOSIS — Z79899 Other long term (current) drug therapy: Secondary | ICD-10-CM | POA: Insufficient documentation

## 2013-12-04 DIAGNOSIS — I10 Essential (primary) hypertension: Secondary | ICD-10-CM | POA: Insufficient documentation

## 2013-12-04 DIAGNOSIS — E785 Hyperlipidemia, unspecified: Secondary | ICD-10-CM | POA: Insufficient documentation

## 2013-12-04 DIAGNOSIS — K5289 Other specified noninfective gastroenteritis and colitis: Secondary | ICD-10-CM | POA: Insufficient documentation

## 2013-12-04 DIAGNOSIS — K529 Noninfective gastroenteritis and colitis, unspecified: Secondary | ICD-10-CM

## 2013-12-04 DIAGNOSIS — R42 Dizziness and giddiness: Secondary | ICD-10-CM | POA: Insufficient documentation

## 2013-12-04 DIAGNOSIS — R197 Diarrhea, unspecified: Secondary | ICD-10-CM | POA: Insufficient documentation

## 2013-12-04 DIAGNOSIS — Z7982 Long term (current) use of aspirin: Secondary | ICD-10-CM | POA: Insufficient documentation

## 2013-12-04 LAB — COMPREHENSIVE METABOLIC PANEL
ALBUMIN: 3.9 g/dL (ref 3.5–5.2)
ALT: 17 U/L (ref 0–35)
AST: 19 U/L (ref 0–37)
Alkaline Phosphatase: 88 U/L (ref 39–117)
BUN: 28 mg/dL — AB (ref 6–23)
CO2: 23 mEq/L (ref 19–32)
CREATININE: 1.01 mg/dL (ref 0.50–1.10)
Calcium: 9.8 mg/dL (ref 8.4–10.5)
Chloride: 101 mEq/L (ref 96–112)
GFR calc Af Amer: 59 mL/min — ABNORMAL LOW (ref >90)
GFR calc non Af Amer: 51 mL/min — ABNORMAL LOW (ref >90)
Glucose, Bld: 163 mg/dL — ABNORMAL HIGH (ref 70–99)
Potassium: 4.6 mEq/L (ref 3.7–5.3)
Sodium: 139 mEq/L (ref 137–147)
TOTAL PROTEIN: 8.2 g/dL (ref 6.0–8.3)
Total Bilirubin: 0.3 mg/dL (ref 0.3–1.2)

## 2013-12-04 LAB — CBC WITH DIFFERENTIAL/PLATELET
BASOS ABS: 0 10*3/uL (ref 0.0–0.1)
Basophils Relative: 0 % (ref 0–1)
Eosinophils Absolute: 0 10*3/uL (ref 0.0–0.7)
Eosinophils Relative: 0 % (ref 0–5)
HEMATOCRIT: 37.5 % (ref 36.0–46.0)
HEMOGLOBIN: 12.6 g/dL (ref 12.0–15.0)
LYMPHS PCT: 2 % — AB (ref 12–46)
Lymphs Abs: 0.2 10*3/uL — ABNORMAL LOW (ref 0.7–4.0)
MCH: 29.6 pg (ref 26.0–34.0)
MCHC: 33.6 g/dL (ref 30.0–36.0)
MCV: 88 fL (ref 78.0–100.0)
MONO ABS: 0.3 10*3/uL (ref 0.1–1.0)
MONOS PCT: 2 % — AB (ref 3–12)
NEUTROS ABS: 14.2 10*3/uL — AB (ref 1.7–7.7)
Neutrophils Relative %: 96 % — ABNORMAL HIGH (ref 43–77)
Platelets: 352 10*3/uL (ref 150–400)
RBC: 4.26 MIL/uL (ref 3.87–5.11)
RDW: 14.9 % (ref 11.5–15.5)
WBC: 14.7 10*3/uL — AB (ref 4.0–10.5)

## 2013-12-04 LAB — TROPONIN I
Troponin I: <0.3 ng/mL (ref <0.30)
Troponin I: <0.3 ng/mL (ref <0.30)

## 2013-12-04 LAB — GLUCOSE, CAPILLARY: GLUCOSE-CAPILLARY: 139 mg/dL — AB (ref 70–99)

## 2013-12-04 LAB — LIPASE, BLOOD: LIPASE: 22 U/L (ref 11–59)

## 2013-12-04 MED ORDER — LOPERAMIDE HCL 2 MG PO TABS: 2.0000 mg | ORAL_TABLET | Freq: Four times a day (QID) | ORAL | Status: DC | PRN

## 2013-12-04 MED ORDER — IOHEXOL 300 MG/ML  SOLN
50.0000 mL | Freq: Once | INTRAMUSCULAR | Status: AC | PRN
  Administered 2013-12-04: 50 mL via ORAL

## 2013-12-04 MED ORDER — IOHEXOL 300 MG/ML  SOLN
100.0000 mL | Freq: Once | INTRAMUSCULAR | Status: AC | PRN
  Administered 2013-12-04: 100 mL via INTRAVENOUS

## 2013-12-04 MED ORDER — SODIUM CHLORIDE 0.9 % IV BOLUS (SEPSIS)
500.0000 mL | Freq: Once | INTRAVENOUS | Status: AC
  Administered 2013-12-04: 1000 mL via INTRAVENOUS

## 2013-12-04 MED ORDER — SODIUM CHLORIDE 0.9 % IV SOLN: INTRAVENOUS | Status: DC

## 2013-12-04 MED ORDER — ONDANSETRON HCL 4 MG/2ML IJ SOLN: 4.0000 mg | Freq: Once | INTRAMUSCULAR | Status: DC

## 2013-12-04 MED ORDER — ONDANSETRON HCL 4 MG/2ML IJ SOLN
4.0000 mg | Freq: Once | INTRAMUSCULAR | Status: AC
  Administered 2013-12-04: 4 mg via INTRAVENOUS
  Filled 2013-12-04: qty 2

## 2013-12-04 MED ORDER — ONDANSETRON 4 MG PO TBDP: 4.0000 mg | ORAL_TABLET | Freq: Three times a day (TID) | ORAL | Status: DC | PRN

## 2013-12-04 NOTE — Discharge Instructions (Signed)
Diet for Diarrhea, Adult Frequent, runny stools (diarrhea) may be caused or worsened by food or drink. Diarrhea may be relieved by changing your diet. Since diarrhea can last up to 7 days, it is easy for you to lose too much fluid from the body and become dehydrated. Fluids that are lost need to be replaced. Along with a modified diet, make sure you drink enough fluids to keep your urine clear or pale yellow. DIET INSTRUCTIONS  Ensure adequate fluid intake (hydration): have 1 cup (8 oz) of fluid for each diarrhea episode. Avoid fluids that contain simple sugars or sports drinks, fruit juices, whole milk products, and sodas. Your urine should be clear or pale yellow if you are drinking enough fluids. Hydrate with an oral rehydration solution that you can purchase at pharmacies, retail stores, and online. You can prepare an oral rehydration solution at home by mixing the following ingredients together:    tsp table salt.   tsp baking soda.   tsp salt substitute containing potassium chloride.  1  tablespoons sugar.  1 L (34 oz) of water.  Certain foods and beverages may increase the speed at which food moves through the gastrointestinal (GI) tract. These foods and beverages should be avoided and include:  Caffeinated and alcoholic beverages.  High-fiber foods, such as raw fruits and vegetables, nuts, seeds, and whole grain breads and cereals.  Foods and beverages sweetened with sugar alcohols, such as xylitol, sorbitol, and mannitol.  Some foods may be well tolerated and may help thicken stool including:  Starchy foods, such as rice, toast, pasta, low-sugar cereal, oatmeal, grits, baked potatoes, crackers, and bagels.   Bananas.   Applesauce.  Add probiotic-rich foods to help increase healthy bacteria in the GI tract, such as yogurt and fermented milk products. RECOMMENDED FOODS AND BEVERAGES Starches Choose foods with less than 2 g of fiber per serving.  Recommended:  White,  [place], and pita breads, plain rolls, buns, bagels. Plain muffins, matzo. Soda, saltine, or graham crackers. Pretzels, melba toast, zwieback. Cooked cereals made with water: cornmeal, farina, cream cereals. Dry cereals: refined corn, wheat, rice. Potatoes prepared any way without skins, refined macaroni, spaghetti, noodles, refined rice.  Avoid:  Bread, rolls, or crackers made with whole wheat, multi-grains, rye, bran seeds, nuts, or coconut. Corn tortillas or taco shells. Cereals containing whole grains, multi-grains, bran, coconut, nuts, raisins. Cooked or dry oatmeal. Coarse wheat cereals, granola. Cereals advertised as "high-fiber." Potato skins. Whole grain pasta, wild or brown rice. Popcorn. Sweet potatoes, yams. Sweet rolls, doughnuts, waffles, pancakes, sweet breads. Vegetables  Recommended: Strained tomato and vegetable juices. Most well-cooked and canned vegetables without seeds. Fresh: Tender lettuce, cucumber without the skin, cabbage, spinach, bean sprouts.  Avoid: Fresh, cooked, or canned: Artichokes, baked beans, beet greens, broccoli, Brussels sprouts, corn, kale, legumes, peas, sweet potatoes. Cooked: Green or red cabbage, spinach. Avoid large servings of any vegetables because vegetables shrink when cooked, and they contain more fiber per serving than fresh vegetables. Fruit  Recommended: Cooked or canned: Apricots, applesauce, cantaloupe, cherries, fruit cocktail, grapefruit, grapes, kiwi, mandarin oranges, peaches, pears, plums, watermelon. Fresh: Apples without skin, ripe banana, grapes, cantaloupe, cherries, grapefruit, peaches, oranges, plums. Keep servings limited to  cup or 1 piece.  Avoid: Fresh: Apples with skin, apricots, mangoes, pears, raspberries, strawberries. Prune juice, stewed or dried prunes. Dried fruits, raisins, dates. Large servings of all fresh fruits. Protein  Recommended: Ground or well-cooked tender beef, ham, veal, lamb, pork, or poultry. Eggs. Fish,  oysters, shrimp,  lobster, other seafoods. Liver, organ meats.  Avoid: Tough, fibrous meats with gristle. Peanut butter, smooth or chunky. Cheese, nuts, seeds, legumes, dried peas, beans, lentils. Dairy  Recommended: Yogurt, lactose-free milk, kefir, drinkable yogurt, buttermilk, soy milk, or plain hard cheese.  Avoid: Milk, chocolate milk, beverages made with milk, such as milkshakes. Soups  Recommended: Bouillon, broth, or soups made from allowed foods. Any strained soup.  Avoid: Soups made from vegetables that are not allowed, cream or milk-based soups. Desserts and Sweets  Recommended: Sugar-free gelatin, sugar-free frozen ice pops made without sugar alcohol.  Avoid: Plain cakes and cookies, pie made with fruit, pudding, custard, cream pie. Gelatin, fruit, ice, sherbet, frozen ice pops. Ice cream, ice milk without nuts. Plain hard candy, honey, jelly, molasses, syrup, sugar, chocolate syrup, gumdrops, marshmallows. Fats and Oils  Recommended: Limit fats to less than 8 tsp per day.  Avoid: Seeds, nuts, olives, avocados. Margarine, butter, cream, mayonnaise, salad oils, plain salad dressings. Plain gravy, crisp bacon without rind. Beverages  Recommended: Water, decaffeinated teas, oral rehydration solutions, sugar-free beverages not sweetened with sugar alcohols.  Avoid: Fruit juices, caffeinated beverages (coffee, tea, soda), alcohol, sports drinks, or lemon-lime soda. Condiments  Recommended: Ketchup, mustard, horseradish, vinegar, cocoa powder. Spices in moderation: allspice, basil, bay leaves, celery powder or leaves, cinnamon, cumin powder, curry powder, ginger, mace, marjoram, onion or garlic powder, oregano, paprika, parsley flakes, ground pepper, rosemary, sage, savory, tarragon, thyme, turmeric.  Avoid: Coconut, honey. Document Released: 01/04/2004 Document Revised: 07/08/2012 Document Reviewed: 02/28/2012 [facility] Patient Information 2014 [place].  Take  medications as directed. Follow the diet that we talked about. Return for any new or worse symptoms. Followup with your doctor if not improved in 2 days.

## 2013-12-04 NOTE — ED Notes (Addendum)
Pt denies any n/v at present time, has finished drinking ct contrast,

## 2013-12-04 NOTE — ED Notes (Signed)
Ambulated unit w/out c/o dizziness.  States she feels much better than when she came in.  No c/o nausea or pain.  States she has a little indigestion from the contrast.

## 2013-12-04 NOTE — ED Notes (Signed)
Syncope, diarrhea and nausea,sore under left ribs

## 2013-12-04 NOTE — ED Notes (Signed)
Pt c/o syncopal episode at home, diarrhea, nausea, Dr. Rogene Houston in prior to RN, see edp assessment for further,

## 2013-12-04 NOTE — ED Notes (Signed)
Dr Zackowski at bedside,  

## 2013-12-04 NOTE — ED Provider Notes (Addendum)
CSN: [id number]     Arrival date & time 12/04/13  1357 History   First MD Initiated Contact with Patient 12/04/13 1511     Chief Complaint  Patient presents with  . Loss of Consciousness   (Consider location/radiation/quality/duration/timing/severity/associated sxs/prior Treatment) The history is provided by the patient and the spouse.   80 year old female primary care Dr. Nevada Crane. Patient with acute onset of diarrhea-type illness at 8:00 in the morning. Associated with nausea but no vomiting more than 10 loose bowel movements today no blood in it. Episodes of diarrhea associated with some diaphoresis nausea dizziness lightheadedness and near-syncope. It sounds as if there was at least one episode of true syncope on the way to the emergency department. Patient did not fall or hit her head just was unresponsive for a brief period of time. No other family members with any illness. Patient has some mild abdominal discomfort is diffuse no significant or localized pain. Patient denies any chest pain however due to the symptoms the family got alarmed that it could be a heart attack. So patient is concerned about that. Patient has no cardiac history. Patient denies any chest pain or discomfort at all.  Past Medical History  Diagnosis Date  . Hyperlipidemia   . Thyroid nodule     with biopsy- states following medically  . Pneumonia     2 years ago/ states occ cough with sinus drainage- no fever  . Hypertension     clearance with note Dr Nevada Crane on chart  . GERD (gastroesophageal reflux disease)     occasional    Past Surgical History  Procedure Laterality Date  . Joint replacement      left knee  . Colonoscopy    . Tubal ligation    . Total knee arthroplasty  12/16/2011    Procedure: TOTAL KNEE ARTHROPLASTY;  Surgeon: Gearlean Alf, MD;  Location: [facility];  Service: Orthopedics;  Laterality: Right;  . Cataract extraction w/phaco  11/30/2012    Procedure: CATARACT EXTRACTION PHACO AND INTRAOCULAR LENS  PLACEMENT (IOC);  Surgeon: Williams Che, MD;  Location: [facility];  Service: Ophthalmology;  Laterality: Left;  CDE:11.49  . Cataract extraction w/phaco Right 03/15/2013    Procedure: CATARACT EXTRACTION PHACO AND INTRAOCULAR LENS PLACEMENT (IOC);  Surgeon: Williams Che, MD;  Location: [facility];  Service: Ophthalmology;  Laterality: Right;  CDE 16.15   No family history on file. History  Substance Use Topics  . Smoking status: Never Smoker   . Smokeless tobacco: Never Used  . Alcohol Use: Yes     Comment: rarely wine   OB History   Grav Para Term Preterm Abortions TAB SAB Ect Mult Living                 Review of Systems  Constitutional: Positive for fatigue.  HENT: Negative for congestion.   Eyes: Negative for visual disturbance.  Respiratory: Negative for shortness of breath.   Cardiovascular: Negative for chest pain.  Gastrointestinal: Positive for nausea, abdominal pain and diarrhea. Negative for vomiting and blood in stool.  Genitourinary: Negative for dysuria and hematuria.  Musculoskeletal: Positive for myalgias. Negative for back pain.  Neurological: Positive for dizziness, syncope and light-headedness.  Hematological: Does not bruise/bleed easily.  Psychiatric/Behavioral: Negative for confusion.    Allergies  Penicillins  Home Medications   Current Outpatient Rx  Name  Route  Sig  Dispense  Refill  . aspirin 325 MG tablet   Oral   Take 162.5 mg by  mouth at bedtime.          . cholecalciferol (VITAMIN D) 1000 UNITS tablet   Oral   Take 1,000 Units by mouth daily.         Marland Kitchen losartan (COZAAR) 100 MG tablet   Oral   Take 100 mg by mouth daily.          . Multiple Vitamin (MULTIVITAMIN WITH MINERALS) TABS   Oral   Take 1 tablet by mouth daily.         . simvastatin (ZOCOR) 40 MG tablet   Oral   Take 40 mg by mouth every evening.         . vitamin E 400 UNIT capsule   Oral   Take 800 Units by mouth daily.         Marland Kitchen loperamide (IMODIUM  A-D) 2 MG tablet   Oral   Take 1 tablet (2 mg total) by mouth 4 (four) times daily as needed for diarrhea or loose stools.   30 tablet   0   . ondansetron (ZOFRAN ODT) 4 MG disintegrating tablet   Oral   Take 1 tablet (4 mg total) by mouth every 8 (eight) hours as needed.   10 tablet   1    BP 150/77  Pulse 118  Temp(Src) 97.9 F (36.6 C) (Oral)  Resp 16  Ht 5\' 4"  (1.626 m)  Wt 165 lb (74.844 kg)  BMI 28.31 kg/m2  SpO2 97% Physical Exam  Nursing note and vitals reviewed. Constitutional: She is oriented to person, place, and time. She appears well-developed and well-nourished. No distress.  HENT:  Head: Normocephalic and atraumatic.  Mouth/Throat: Oropharynx is clear and moist.  Eyes: Conjunctivae are normal. Pupils are equal, round, and reactive to light.  Neck: Normal range of motion.  Cardiovascular: Normal rate, regular rhythm and normal heart sounds.   No murmur heard. Abdominal: Soft. Bowel sounds are normal. There is tenderness. There is no guarding.  Mild diffuse tenderness without guarding.  Musculoskeletal: Normal range of motion. She exhibits no edema.  Neurological: She is alert and oriented to person, place, and time. No cranial nerve deficit. She exhibits normal muscle tone. Coordination normal.  Skin: Skin is warm. No erythema.    ED Course  Procedures (including critical care time) Labs Review Labs Reviewed  GLUCOSE, CAPILLARY - Abnormal; Notable for the following:    Glucose-Capillary 139 (*)    All other components within normal limits  COMPREHENSIVE METABOLIC PANEL - Abnormal; Notable for the following:    Glucose, Bld 163 (*)    BUN 28 (*)    GFR calc non Af Amer 51 (*)    GFR calc Af Amer 59 (*)    All other components within normal limits  CBC WITH DIFFERENTIAL - Abnormal; Notable for the following:    WBC 14.7 (*)    Neutrophils Relative % 96 (*)    Neutro Abs 14.2 (*)    Lymphocytes Relative 2 (*)    Lymphs Abs 0.2 (*)    Monocytes  Relative 2 (*)    All other components within normal limits  TROPONIN I  LIPASE, BLOOD  TROPONIN I   Results for orders placed during the hospital encounter of 12/04/13  GLUCOSE, CAPILLARY      Result Value Range   Glucose-Capillary 139 (*) 70 - 99 mg/dL  TROPONIN I      Result Value Range   Troponin I <0.30  <0.30 ng/mL  COMPREHENSIVE METABOLIC PANEL  Result Value Range   Sodium 139  137 - 147 mEq/L   Potassium 4.6  3.7 - 5.3 mEq/L   Chloride 101  96 - 112 mEq/L   CO2 23  19 - 32 mEq/L   Glucose, Bld 163 (*) 70 - 99 mg/dL   BUN 28 (*) 6 - 23 mg/dL   Creatinine, Ser 1.01  0.50 - 1.10 mg/dL   Calcium 9.8  8.4 - 10.5 mg/dL   Total Protein 8.2  6.0 - 8.3 g/dL   Albumin 3.9  3.5 - 5.2 g/dL   AST 19  0 - 37 U/L   ALT 17  0 - 35 U/L   Alkaline Phosphatase 88  39 - 117 U/L   Total Bilirubin 0.3  0.3 - 1.2 mg/dL   GFR calc non Af Amer 51 (*) >90 mL/min   GFR calc Af Amer 59 (*) >90 mL/min  LIPASE, BLOOD      Result Value Range   Lipase 22  11 - 59 U/L  CBC WITH DIFFERENTIAL      Result Value Range   WBC 14.7 (*) 4.0 - 10.5 K/uL   RBC 4.26  3.87 - 5.11 MIL/uL   Hemoglobin 12.6  12.0 - 15.0 g/dL   HCT 37.5  36.0 - 46.0 %   MCV 88.0  78.0 - 100.0 fL   MCH 29.6  26.0 - 34.0 pg   MCHC 33.6  30.0 - 36.0 g/dL   RDW 14.9  11.5 - 15.5 %   Platelets 352  150 - 400 K/uL   Neutrophils Relative % 96 (*) 43 - 77 %   Neutro Abs 14.2 (*) 1.7 - 7.7 K/uL   Lymphocytes Relative 2 (*) 12 - 46 %   Lymphs Abs 0.2 (*) 0.7 - 4.0 K/uL   Monocytes Relative 2 (*) 3 - 12 %   Monocytes Absolute 0.3  0.1 - 1.0 K/uL   Eosinophils Relative 0  0 - 5 %   Eosinophils Absolute 0.0  0.0 - 0.7 K/uL   Basophils Relative 0  0 - 1 %   Basophils Absolute 0.0  0.0 - 0.1 K/uL  TROPONIN I      Result Value Range   Troponin I <0.30  <0.30 ng/mL    Imaging Review Ct Abdomen Pelvis W Contrast  12/04/2013   CLINICAL DATA:  Diarrhea, abdominal pain.  EXAM: CT ABDOMEN AND PELVIS WITH CONTRAST  TECHNIQUE:  Multidetector CT imaging of the abdomen and pelvis was performed using the standard protocol following bolus administration of intravenous contrast.  CONTRAST:  11mL OMNIPAQUE IOHEXOL 300 MG/ML  SOLN  COMPARISON:  DG ABD ACUTE W/CHEST dated 12/04/2013  FINDINGS: Coronary artery and valvular calcifications. Heart is normal size. Lung bases are clear. No effusions.  Small hiatal hernia. Liver, gallbladder, spleen, pancreas, adrenals and kidneys are normal. Small cyst in the lower pole of the right kidney. No hydronephrosis. No visible stones.  Small duodenum diverticulum off the second portion of the duodenum. Remainder of the small bowel is unremarkable.  Sigmoid diverticulosis. Slight stranding around the sigmoid colon, best seen on coronal images 71-76 may reflect early changes of active diverticulitis. No evidence of bowel obstruction. No free fluid or free air. Uterus and adnexa as well as urinary bladder unremarkable.  Aorta and iliac vessels are moderately calcified, non aneurysmal.  No acute bony abnormality. Partial fusion across the SI joints bilaterally. Degenerative changes in the lower lumbar spine.  IMPRESSION: Sigmoid diverticulosis. Subtle stranding around the  sigmoid colon on the coronal reconstructed images may reflect early active diverticulitis.  Small hiatal hernia.   Electronically Signed   By: Rolm Baptise M.D.   On: 12/04/2013 19:08   Dg Abd Acute W/chest  12/04/2013   CLINICAL DATA:  Diarrhea.  Loss of consciousness.  EXAM: ACUTE ABDOMEN SERIES (ABDOMEN 2 VIEW & CHEST 1 VIEW)  COMPARISON:  DG CHEST 2 VIEW dated 12/05/2011  FINDINGS: Frontal view of the chest demonstrates midline trachea. Patient rotated minimally right. Normal heart size with a tortuous thoracic aorta. No pleural effusion or pneumothorax. Mild left base scarring or atelectasis.  Abdominal films demonstrate air-fluid levels throughout small bowel loops within the mid abdomen. No free intraperitoneal air.  No significant bowel  distention on supine imaging. Phleboliths in the pelvis. No abnormal abdominal calcifications. No appendicolith. S-shaped thoracolumbar spine curvature.  IMPRESSION: Scattered air-fluid levels within the mid abdomen. Given the clinical history, this may represent a viral gastroenteritis.  No acute cardiopulmonary disease.   Electronically Signed   By: Abigail Miyamoto M.D.   On: 12/04/2013 17:07    EKG Interpretation    Date/Time:  Saturday December 04 2013 15:01:13 EST Ventricular Rate:  108 PR Interval:  146 QRS Duration: 74 QT Interval:  358 QTC Calculation: 479 R Axis:   -24 Text Interpretation:  Sinus tachycardia Cannot rule out Anterior infarct , age undetermined Abnormal ECG When compared with ECG of 24-Nov-2012 10:25, No significant change was found Confirmed by Basma Buchner  MD, Medina Degraffenreid ([id number]) on 12/04/2013 3:36:48 PM            MDM   1. Gastroenteritis    Symptoms consistent with a viral gastroenteritis but predominantly just diarrhea-type symptoms. Workup of negative for any acute abdominal process. There was a leukocytosis however CT scan showed no significant abnormalities. Clinically it raises questions of early diverticulitis. Patient's symptomatology is not consistent with that at all. She's had multiple episodes of watery mucousy kind of bowel movements. Symptoms are most likely viral in nature. Patient improved here with fluids antinausea medicine ion some pain medicine. Patient able to sit stand and ambulate now without difficulties. Will discharge home with anti-diarrheal and anti-vomiting medicine.  Patient has some concern about cardiac event do to the near syncopal episodes. A workup for that was negative with 2 troponins and EKG without any acute changes.   Mervin Kung, MD 12/04/13 2027  Mervin Kung, MD 12/04/13 2032

## 2015-08-18 DIAGNOSIS — E782 Mixed hyperlipidemia: Secondary | ICD-10-CM | POA: Diagnosis not present

## 2015-08-18 DIAGNOSIS — R944 Abnormal results of kidney function studies: Secondary | ICD-10-CM | POA: Diagnosis not present

## 2016-01-04 DIAGNOSIS — E559 Vitamin D deficiency, unspecified: Secondary | ICD-10-CM | POA: Diagnosis not present

## 2016-01-04 DIAGNOSIS — E782 Mixed hyperlipidemia: Secondary | ICD-10-CM | POA: Diagnosis not present

## 2016-01-04 DIAGNOSIS — R7301 Impaired fasting glucose: Secondary | ICD-10-CM | POA: Diagnosis not present

## 2016-01-10 DIAGNOSIS — N182 Chronic kidney disease, stage 2 (mild): Secondary | ICD-10-CM | POA: Diagnosis not present

## 2016-01-10 DIAGNOSIS — R7301 Impaired fasting glucose: Secondary | ICD-10-CM | POA: Diagnosis not present

## 2016-01-10 DIAGNOSIS — E782 Mixed hyperlipidemia: Secondary | ICD-10-CM | POA: Diagnosis not present

## 2016-01-10 DIAGNOSIS — I10 Essential (primary) hypertension: Secondary | ICD-10-CM | POA: Diagnosis not present

## 2016-04-15 DIAGNOSIS — S31829A Unspecified open wound of left buttock, initial encounter: Secondary | ICD-10-CM | POA: Diagnosis not present

## 2016-05-07 DIAGNOSIS — C44519 Basal cell carcinoma of skin of other part of trunk: Secondary | ICD-10-CM | POA: Diagnosis not present

## 2016-06-11 DIAGNOSIS — C44519 Basal cell carcinoma of skin of other part of trunk: Secondary | ICD-10-CM | POA: Diagnosis not present

## 2016-06-11 NOTE — H&P (Signed)
  NTS SOAP Note  Vital Signs:  Vitals as of: 0000000: Systolic 0000000: Diastolic 96: Heart Rate 99991111: Temp 98.58F (Temporal): Height 17ft 4.5in: Weight 169Lbs 0 Ounces: BMI 28.56   BMI : 28.56 kg/m2  Subjective: This 82 year old female presents for of a basal cell carcinoma of the left buttock.  Was removed by Dr. Nevada Crane of Dermatology in 7/17, but margins are positive.  Referred for formal excision.  Has been putting antibiotic on wound.  Also has a skin tag on right buttock.  Review of Symptoms:  Constitutional:negative Head:negative Eyes:negative Nose/Mouth/Throat:negative Cardiovascular:negative Respiratory:negative Gastrointestinnegative Genitourinary:negative Musculoskeletal:negative as above Hematolgic/Lymphatic:negative Allergic/Immunologic:negative   Past Medical History:Reviewed  Past Medical History  Surgical History: bilateral knee replacements 2012-13 Medical Problems: HTN Allergies: pcn Medications: losartan, 1/2 asa   Social History:Reviewed  Social History  Preferred Language: English Race:  White Ethnicity: Not Hispanic / Latino Age: 19 year Marital Status:  M Alcohol: no   Smoking Status: Never smoker reviewed on 06/11/2016 Functional Status reviewed on 06/11/2016 ------------------------------------------------ Bathing: Normal Cooking: Normal Dressing: Normal Driving: Normal Eating: Normal Managing Meds: Normal Oral Care: Normal Shopping: Normal Toileting: Normal Transferring: Normal Walking: Normal Cognitive Status reviewed on 06/11/2016 ------------------------------------------------ Attention: Normal Decision Making: Normal Language: Normal Memory: Normal Motor: Normal Perception: Normal Problem Solving: Normal Visual and Spatial: Normal   Family History:Reviewed  Family Health History Mother, Deceased; Healthy;  Father, Deceased; Healthy;     Objective Information: General:Well appearing, well  nourished in no distress. 50mm left buttock wound, partial thickness, no purulent drainage, soft eschar present over it.  Right buttock skin tag present. Head:Atraumatic; no masses; no abnormalities Neck:Supple without lymphadenopathy.  Heart:RRR, no murmur or gallop.  Normal S1, S2.  No S3, S4.  Lungs:CTA bilaterally, no wheezes, rhonchi, rales.  Breathing unlabored. Abdomen:Soft, NT/ND, no HSM, no masses.  Assessment:Basal cell carcinoma, left buttock Skin tag, right buttock  Diagnoses: 173.51  C44.519 Basal cell carcinoma of truncal skin (Basal cell carcinoma of skin of other part of trunk)  Procedures: [id number] - OFFICE OUTPATIENT NEW 30 MINUTES    Plan:  Scheduled for excision of skin malignancy, left buttock:  Shave removal of skin tag, right buttock on 06/17/16.   Patient Education:Alternative treatments to surgery were discussed with patient (and family).Risks and benefits  of procedure were fully explained to the patient (and family) who gave informed consent. Patient/family questions were addressed.  Follow-up:Pending Surgery

## 2016-06-12 NOTE — Patient Instructions (Signed)
[name]  06/12/2016     @PREFPERIOPPHARMACY @   Your procedure is scheduled on  06/17/2016 .  Report to [facility] at  615  A.M.  Call this number if you have problems the morning of surgery:  [phone]   Remember:  Do not eat food or drink liquids after midnight.  Take these medicines the morning of surgery with A SIP OF WATER  Cozaar, zofran.   Do not wear jewelry, make-up or nail polish.  Do not wear lotions, powders, or perfumes.  You may wear deoderant.  Do not shave 48 hours prior to surgery.  Men may shave face and neck.  Do not bring valuables to the hospital.  [facility] is not responsible for any belongings or valuables.  Contacts, dentures or bridgework may not be worn into surgery.  Leave your suitcase in the car.  After surgery it may be brought to your room.  For patients admitted to the hospital, discharge time will be determined by your treatment team.  Patients discharged the day of surgery will not be allowed to drive home.   Name and phone number of your driver:   family Special instructions:  none  Please read over the following fact sheets that you were given. Anesthesia Post-op Instructions and Care and Recovery After Surgery      Excision of Skin Lesions Excision of a skin lesion refers to the removal of a section of skin by making small cuts (incisions) in the skin. This procedure may be done to remove a cancerous (malignant) or noncancerous (benign) growth on the skin. It is typically done to treat or prevent cancer or infection. It may also be done to improve cosmetic appearance. The procedure may be done to remove:  Cancerous growths, such as basal cell carcinoma, squamous cell carcinoma, or melanoma.  Noncancerous growths, such as a cyst or lipoma.  Growths, such as moles or skin tags, which may be removed for cosmetic reasons. Various excision or surgical techniques may be used depending on your condition, the location of  the lesion, and your overall health. LET [facility] CARE PROVIDER KNOW ABOUT:  Any allergies you have.  All medicines you are taking, including vitamins, herbs, eye drops, creams, and over-the-counter medicines.  Previous problems you or members of your family have had with the use of anesthetics.  Any blood disorders you have.  Previous surgeries you have had.  Any medical conditions you have.  Whether you are pregnant or may be pregnant. RISKS AND COMPLICATIONS Generally, this is a safe procedure. However, problems may occur, including:  Bleeding.  Infection.  Scarring.  Recurrence of the cyst, lipoma, or cancer.  Changes in skin sensation or appearance, such as discoloration or swelling.  Reaction to the anesthetics.  Allergic reaction to surgical materials or ointments.  Damage to nerves, blood vessels, muscles, or other structures.  Continued pain. BEFORE THE PROCEDURE  Ask your health care provider about:  Changing or stopping your regular medicines. This is especially important if you are taking diabetes medicines or blood thinners.  Taking medicines such as aspirin and ibuprofen. These medicines can thin your blood. Do not take these medicines before your procedure if your health care provider instructs you not to.  You may be asked to take certain medicines.  You may be asked to stop smoking.  You may have an exam or testing.  Plan to have someone take you home after the  procedure.  Plan to have someone help you with activities during recovery. PROCEDURE  To reduce your risk of infection:  Your health care team will wash or sanitize their hands.  Your skin will be washed with soap.  You will be given a medicine to numb the area (local anesthetic).  One of the following excision techniques will be performed.  At the end of any of these procedures, antibiotic ointment will be applied as needed. Each of the following techniques may vary among  health care providers and hospitals. Complete Surgical Excision The area of skin that needs to be removed will be marked with a pen. Using a small scalpel or scissors, the surgeon will gently cut around and under the lesion until it is completely removed. The lesion will be placed in a fluid and sent to the lab for examination. If necessary, bleeding will be controlled with a device that delivers heat (electrocautery). The edges of the wound may be stitched (sutured) together, and a bandage (dressing) will be applied. This procedure may be performed to treat a cancerous growth or a noncancerous cyst or lesion. Excision of a Cyst The surgeon will make an incision on the cyst. The entire cyst will be removed through the incision. The incision may be closed with sutures. Shave Excision During shave excision, the surgeon will use a small blade or an electrically heated loop instrument to shave off the lesion. This may be done to remove a mole or a skin tag. The wound will usually be left to heal on its own without sutures. Punch Excision During punch excision, the surgeon will use a small tool that is like a cookie cutter or a hole punch to cut a circle shape out of the skin. The outer edges of the skin will be sutured together. This may be done to remove a mole or a scar or to perform a biopsy of the lesion. Mohs Micrographic Surgery During Mohs micrographic surgery, layers of the lesion will be removed with a scalpel or a loop instrument and will be examined right away under a microscope. Layers will be removed until all of the abnormal or cancerous tissue has been removed. This procedure is minimally invasive, and it ensures the best cosmetic outcome. It involves the removal of as little normal tissue as possible. Mohs is usually done to treat skin cancer, such as basal cell carcinoma or squamous cell carcinoma, particularly on the face and ears. Depending on the size of the surgical wound, it may be  sutured closed. AFTER THE PROCEDURE  Return to your normal activities as told by your health care provider.  Talk with your health care provider to discuss any test results, treatment options, and if necessary, the need for more tests.   This information is not intended to replace advice given to you by your health care provider. Make sure you discuss any questions you have with your health care provider.   Document Released: 01/08/2010 Document Revised: 07/05/2015 Document Reviewed: 11/30/2014 Elsevier Interactive Patient Education 2016 [employer] Excision of Skin Lesions, Care After Refer to this sheet in the next few weeks. These instructions provide you with information about caring for yourself after your procedure. Your health care provider may also give you more specific instructions. Your treatment has been planned according to current medical practices, but problems sometimes occur. Call your health care provider if you have any problems or questions after your procedure. WHAT TO EXPECT AFTER THE PROCEDURE After your procedure, it is  common to have pain or discomfort at the excision site. HOME CARE INSTRUCTIONS  Take over-the-counter and prescription medicines only as told by your health care provider.  Follow instructions from your health care provider about:  How to take care of your excision site. You should keep the site clean, dry, and protected for at least 48 hours.  When and how you should change your bandage (dressing).  When you should remove your dressing.  Removing whatever was used to close your excision site.  Check the excision area every day for signs of infection. Watch for:  Redness, swelling, or pain.  Fluid, blood, or pus.  For bleeding, apply gentle but firm pressure to the area using a folded towel for 20 minutes.  Avoid high-impact exercise and activities until the stitches (sutures) are removed or the area heals.  Follow instructions from  your health care provider about how to minimize scarring. Avoid sun exposure until the area has healed. Scarring should lessen over time.  Keep all follow-up visits as told by your health care provider. This is important. SEEK MEDICAL CARE IF:  You have a fever.  You have redness, swelling, or pain at the excision site.  You have fluid, blood, or pus coming from the excision site.  You have ongoing bleeding at the excision site.  You have pain that does not improve in 2-3 days after your procedure.  You notice skin irregularities or changes in sensation.   This information is not intended to replace advice given to you by your health care provider. Make sure you discuss any questions you have with your health care provider.   Document Released: 02/28/2015 Document Reviewed: 02/28/2015 Elsevier Interactive Patient Education 2016 [employer] PATIENT INSTRUCTIONS POST-ANESTHESIA  IMMEDIATELY FOLLOWING SURGERY:  Do not drive or operate machinery for the first twenty four hours after surgery.  Do not make any important decisions for twenty four hours after surgery or while taking narcotic pain medications or sedatives.  If you develop intractable nausea and vomiting or a severe headache please notify your doctor immediately.  FOLLOW-UP:  Please make an appointment with your surgeon as instructed. You do not need to follow up with anesthesia unless specifically instructed to do so.  WOUND CARE INSTRUCTIONS (if applicable):  Keep a dry clean dressing on the anesthesia/puncture wound site if there is drainage.  Once the wound has quit draining you may leave it open to air.  Generally you should leave the bandage intact for twenty four hours unless there is drainage.  If the epidural site drains for more than 36-48 hours please call the anesthesia department.  QUESTIONS?:  Please feel free to call your physician or the hospital operator if you have any questions, and they will be happy to  assist you.

## 2016-06-13 ENCOUNTER — Encounter (HOSPITAL_COMMUNITY): Payer: Self-pay

## 2016-06-13 ENCOUNTER — Encounter (HOSPITAL_COMMUNITY)
Admission: RE | Admit: 2016-06-13 | Discharge: 2016-06-13 | Disposition: A | Discharge status: Home / Self Care | Payer: Medicare HMO | Source: Ambulatory Visit | Attending: General Surgery | Admitting: General Surgery

## 2016-06-13 ENCOUNTER — Other Ambulatory Visit: Payer: Self-pay

## 2016-06-13 DIAGNOSIS — Z01810 Encounter for preprocedural cardiovascular examination: Secondary | ICD-10-CM | POA: Insufficient documentation

## 2016-06-13 DIAGNOSIS — Z471 Aftercare following joint replacement surgery: Secondary | ICD-10-CM | POA: Diagnosis not present

## 2016-06-13 DIAGNOSIS — Z01812 Encounter for preprocedural laboratory examination: Secondary | ICD-10-CM | POA: Diagnosis not present

## 2016-06-13 DIAGNOSIS — Z96652 Presence of left artificial knee joint: Secondary | ICD-10-CM | POA: Diagnosis not present

## 2016-06-13 DIAGNOSIS — Z96653 Presence of artificial knee joint, bilateral: Secondary | ICD-10-CM | POA: Diagnosis not present

## 2016-06-13 DIAGNOSIS — Z96651 Presence of right artificial knee joint: Secondary | ICD-10-CM | POA: Diagnosis not present

## 2016-06-13 DIAGNOSIS — I10 Essential (primary) hypertension: Secondary | ICD-10-CM | POA: Insufficient documentation

## 2016-06-13 LAB — BASIC METABOLIC PANEL
ANION GAP: 8 (ref 5–15)
BUN: 20 mg/dL (ref 6–20)
CHLORIDE: 103 mmol/L (ref 101–111)
CO2: 26 mmol/L (ref 22–32)
Calcium: 9.5 mg/dL (ref 8.9–10.3)
Creatinine, Ser: 1.14 mg/dL — ABNORMAL HIGH (ref 0.44–1.00)
GFR, EST AFRICAN AMERICAN: 50 mL/min — AB (ref >60)
GFR, EST NON AFRICAN AMERICAN: 43 mL/min — AB (ref >60)
Glucose, Bld: 98 mg/dL (ref 65–99)
POTASSIUM: 4.2 mmol/L (ref 3.5–5.1)
SODIUM: 137 mmol/L (ref 135–145)

## 2016-06-13 LAB — CBC WITH DIFFERENTIAL/PLATELET
BASOS ABS: 0 10*3/uL (ref 0.0–0.1)
Basophils Relative: 0 %
EOS PCT: 2 %
Eosinophils Absolute: 0.1 10*3/uL (ref 0.0–0.7)
HEMATOCRIT: 39.5 % (ref 36.0–46.0)
HEMOGLOBIN: 13.3 g/dL (ref 12.0–15.0)
LYMPHS ABS: 3.1 10*3/uL (ref 0.7–4.0)
LYMPHS PCT: 37 %
MCH: 29.6 pg (ref 26.0–34.0)
MCHC: 33.7 g/dL (ref 30.0–36.0)
MCV: 88 fL (ref 78.0–100.0)
Monocytes Absolute: 0.7 10*3/uL (ref 0.1–1.0)
Monocytes Relative: 8 %
NEUTROS ABS: 4.4 10*3/uL (ref 1.7–7.7)
NEUTROS PCT: 53 %
PLATELETS: 315 10*3/uL (ref 150–400)
RBC: 4.49 MIL/uL (ref 3.87–5.11)
RDW: 15.3 % (ref 11.5–15.5)
WBC: 8.3 10*3/uL (ref 4.0–10.5)

## 2016-06-13 NOTE — Pre-Procedure Instructions (Signed)
Patient given information to sign up for my chart at home. 

## 2016-06-13 NOTE — Progress Notes (Signed)
   06/13/16 [place]  Have you ever been diagnosed with sleep apnea through a sleep study? No  Do you snore loudly (loud enough to be heard through closed doors)?  0  Do you often feel tired, fatigued, or sleepy during the daytime (such as falling asleep during driving or talking to someone)? 1  Has anyone observed you stop breathing during your sleep? 1  Do you have, or are you being treated for high blood pressure? 1  BMI more than 35 kg/m2? 0  Age > 50 (1-yes) 1  Neck circumference greater than:Female 16 inches or larger, Female 17inches or larger? 0  Female Gender (Yes=1) 0  Obstructive Sleep Apnea Score 4

## 2016-06-17 ENCOUNTER — Ambulatory Visit (HOSPITAL_COMMUNITY)
Admission: RE | Admit: 2016-06-17 | Discharge: 2016-06-17 | Disposition: A | Discharge status: Home / Self Care | Payer: Medicare HMO | Source: Ambulatory Visit | Attending: General Surgery | Admitting: General Surgery

## 2016-06-17 ENCOUNTER — Ambulatory Visit (HOSPITAL_COMMUNITY): Payer: Medicare HMO | Admitting: Anesthesiology

## 2016-06-17 ENCOUNTER — Encounter (HOSPITAL_COMMUNITY): Payer: Self-pay | Admitting: Anesthesiology

## 2016-06-17 ENCOUNTER — Encounter (HOSPITAL_COMMUNITY)
Admission: RE | Disposition: A | Discharge status: Home / Self Care | Payer: Self-pay | Source: Ambulatory Visit | Attending: General Surgery

## 2016-06-17 DIAGNOSIS — D649 Anemia, unspecified: Secondary | ICD-10-CM | POA: Insufficient documentation

## 2016-06-17 DIAGNOSIS — Z96653 Presence of artificial knee joint, bilateral: Secondary | ICD-10-CM | POA: Diagnosis not present

## 2016-06-17 DIAGNOSIS — M1990 Unspecified osteoarthritis, unspecified site: Secondary | ICD-10-CM | POA: Insufficient documentation

## 2016-06-17 DIAGNOSIS — Z79899 Other long term (current) drug therapy: Secondary | ICD-10-CM | POA: Diagnosis not present

## 2016-06-17 DIAGNOSIS — E785 Hyperlipidemia, unspecified: Secondary | ICD-10-CM | POA: Insufficient documentation

## 2016-06-17 DIAGNOSIS — Z7982 Long term (current) use of aspirin: Secondary | ICD-10-CM | POA: Diagnosis not present

## 2016-06-17 DIAGNOSIS — C44519 Basal cell carcinoma of skin of other part of trunk: Secondary | ICD-10-CM | POA: Insufficient documentation

## 2016-06-17 DIAGNOSIS — K219 Gastro-esophageal reflux disease without esophagitis: Secondary | ICD-10-CM | POA: Insufficient documentation

## 2016-06-17 DIAGNOSIS — C4491 Basal cell carcinoma of skin, unspecified: Secondary | ICD-10-CM | POA: Diagnosis present

## 2016-06-17 DIAGNOSIS — I10 Essential (primary) hypertension: Secondary | ICD-10-CM | POA: Insufficient documentation

## 2016-06-17 DIAGNOSIS — L918 Other hypertrophic disorders of the skin: Secondary | ICD-10-CM | POA: Diagnosis not present

## 2016-06-17 DIAGNOSIS — L821 Other seborrheic keratosis: Secondary | ICD-10-CM | POA: Insufficient documentation

## 2016-06-17 DIAGNOSIS — Z880 Allergy status to penicillin: Secondary | ICD-10-CM | POA: Diagnosis not present

## 2016-06-17 HISTORY — PX: EXCISION OF SKIN TAG: SHX6270

## 2016-06-17 HISTORY — PX: MASS EXCISION: SHX2000

## 2016-06-17 SURGERY — EXCISION MASS
Anesthesia: General | Laterality: Right

## 2016-06-17 MED ORDER — BUPIVACAINE HCL (PF) 0.5 % IJ SOLN
INTRAMUSCULAR | Status: DC | PRN
  Administered 2016-06-17: 10 mL

## 2016-06-17 MED ORDER — ONDANSETRON HCL 4 MG/2ML IJ SOLN: 4.0000 mg | Freq: Once | INTRAMUSCULAR | Status: DC | PRN

## 2016-06-17 MED ORDER — EPHEDRINE SULFATE 50 MG/ML IJ SOLN
INTRAMUSCULAR | Status: DC | PRN
  Administered 2016-06-17: 10 mg via INTRAVENOUS

## 2016-06-17 MED ORDER — FENTANYL CITRATE (PF) 100 MCG/2ML IJ SOLN: 25.0000 ug | INTRAMUSCULAR | Status: DC | PRN

## 2016-06-17 MED ORDER — MIDAZOLAM HCL 2 MG/2ML IJ SOLN
INTRAMUSCULAR | Status: AC
  Filled 2016-06-17: qty 2

## 2016-06-17 MED ORDER — 0.9 % SODIUM CHLORIDE (POUR BTL) OPTIME
TOPICAL | Status: DC | PRN
  Administered 2016-06-17: 1000 mL

## 2016-06-17 MED ORDER — CHLORHEXIDINE GLUCONATE CLOTH 2 % EX PADS: 6.0000 | MEDICATED_PAD | Freq: Once | CUTANEOUS | Status: DC

## 2016-06-17 MED ORDER — PROPOFOL 10 MG/ML IV BOLUS
INTRAVENOUS | Status: DC | PRN
  Administered 2016-06-17: 20 mg via INTRAVENOUS
  Administered 2016-06-17: 120 mg via INTRAVENOUS

## 2016-06-17 MED ORDER — TRAMADOL HCL 50 MG PO TABS: 50.0000 mg | ORAL_TABLET | Freq: Four times a day (QID) | ORAL | 0 refills | Status: DC | PRN

## 2016-06-17 MED ORDER — FENTANYL CITRATE (PF) 100 MCG/2ML IJ SOLN
INTRAMUSCULAR | Status: AC
Start: 2016-06-17 — End: 2016-06-17
  Filled 2016-06-17: qty 2

## 2016-06-17 MED ORDER — BUPIVACAINE HCL (PF) 0.5 % IJ SOLN
INTRAMUSCULAR | Status: AC
  Filled 2016-06-17: qty 30

## 2016-06-17 MED ORDER — LIDOCAINE HCL (PF) 1 % IJ SOLN
INTRAMUSCULAR | Status: AC
Start: 2016-06-17 — End: 2016-06-17
  Filled 2016-06-17: qty 5

## 2016-06-17 MED ORDER — CHLORHEXIDINE GLUCONATE CLOTH 2 % EX PADS
6.0000 | MEDICATED_PAD | Freq: Once | CUTANEOUS | Status: DC
Start: 2016-06-17 — End: 2016-06-17

## 2016-06-17 MED ORDER — LIDOCAINE HCL 1 % IJ SOLN
INTRAMUSCULAR | Status: DC | PRN
  Administered 2016-06-17: 25 mg via INTRADERMAL

## 2016-06-17 MED ORDER — POVIDONE-IODINE 10 % OINT PACKET
TOPICAL_OINTMENT | CUTANEOUS | Status: DC | PRN
  Administered 2016-06-17: 1 via TOPICAL

## 2016-06-17 MED ORDER — KETOROLAC TROMETHAMINE 30 MG/ML IJ SOLN: 30.0000 mg | Freq: Once | INTRAMUSCULAR | Status: DC

## 2016-06-17 MED ORDER — POVIDONE-IODINE 10 % EX OINT
TOPICAL_OINTMENT | CUTANEOUS | Status: AC
  Filled 2016-06-17: qty 1

## 2016-06-17 MED ORDER — LACTATED RINGERS IV SOLN
INTRAVENOUS | Status: DC | PRN
  Administered 2016-06-17: 07:00:00 via INTRAVENOUS

## 2016-06-17 MED ORDER — PROPOFOL 10 MG/ML IV BOLUS
INTRAVENOUS | Status: AC
  Filled 2016-06-17: qty 20

## 2016-06-17 MED ORDER — FENTANYL CITRATE (PF) 100 MCG/2ML IJ SOLN
INTRAMUSCULAR | Status: DC | PRN
  Administered 2016-06-17 (×2): 25 ug via INTRAVENOUS

## 2016-06-17 MED ORDER — MIDAZOLAM HCL 5 MG/5ML IJ SOLN
INTRAMUSCULAR | Status: DC | PRN
  Administered 2016-06-17: 2 mg via INTRAVENOUS

## 2016-06-17 SURGICAL SUPPLY — 30 items
BAG HAMPER (MISCELLANEOUS) ×3 IMPLANT
BANDAGE STRIP 1X3 FLEXIBLE (GAUZE/BANDAGES/DRESSINGS) ×3 IMPLANT
BLADE SURG SZ11 CARB STEEL (BLADE) ×6 IMPLANT
CLOTH BEACON ORANGE TIMEOUT ST (SAFETY) ×3 IMPLANT
COVER LIGHT HANDLE STERIS (MISCELLANEOUS) ×6 IMPLANT
DECANTER SPIKE VIAL GLASS SM (MISCELLANEOUS) ×3 IMPLANT
DRSG TEGADERM 4X4.75 (GAUZE/BANDAGES/DRESSINGS) ×3 IMPLANT
ELECT REM PT RETURN 9FT ADLT (ELECTROSURGICAL) ×3
ELECTRODE REM PT RTRN 9FT ADLT (ELECTROSURGICAL) ×2 IMPLANT
FORMALIN 10 PREFIL 120ML (MISCELLANEOUS) ×6 IMPLANT
GLOVE BIOGEL PI IND STRL 7.0 (GLOVE) ×6 IMPLANT
GLOVE BIOGEL PI INDICATOR 7.0 (GLOVE) ×3
GLOVE ECLIPSE 6.5 STRL STRAW (GLOVE) ×3 IMPLANT
GLOVE SURG SS PI 7.5 STRL IVOR (GLOVE) ×3 IMPLANT
GOWN STRL REUS W/ TWL XL LVL3 (GOWN DISPOSABLE) ×2 IMPLANT
GOWN STRL REUS W/TWL LRG LVL3 (GOWN DISPOSABLE) ×3 IMPLANT
GOWN STRL REUS W/TWL XL LVL3 (GOWN DISPOSABLE) ×1
KIT ROOM TURNOVER APOR (KITS) ×3 IMPLANT
MANIFOLD NEPTUNE II (INSTRUMENTS) ×3 IMPLANT
NEEDLE HYPO 25X1 1.5 SAFETY (NEEDLE) ×3 IMPLANT
NS IRRIG 1000ML POUR BTL (IV SOLUTION) ×3 IMPLANT
PACK MINOR (CUSTOM PROCEDURE TRAY) ×3 IMPLANT
PAD ARMBOARD 7.5X6 YLW CONV (MISCELLANEOUS) ×3 IMPLANT
PAD TELFA 3X4 1S STER (GAUZE/BANDAGES/DRESSINGS) ×3 IMPLANT
SET BASIN LINEN APH (SET/KITS/TRAYS/PACK) ×3 IMPLANT
SUT ETHILON 3 0 FSL (SUTURE) IMPLANT
SUT ETHILON 4 0 PS 2 18 (SUTURE) ×18 IMPLANT
SUT VIC AB 3-0 SH 27 (SUTURE) ×1
SUT VIC AB 3-0 SH 27X BRD (SUTURE) ×2 IMPLANT
SYR CONTROL 10ML LL (SYRINGE) ×3 IMPLANT

## 2016-06-17 NOTE — Anesthesia Postprocedure Evaluation (Signed)
Anesthesia Post Note  Patient: [name]  Procedure(s) Performed: Procedure(s) (LRB): EXCISION SKIN MALIGNANT LESION LEFT BUTTOCK (Left) SHAVE OF SKIN TAG RIGHT BUTTOCK (Right)  Patient location during evaluation: PACU Anesthesia Type: General Level of consciousness: awake, oriented and patient cooperative Pain management: pain level controlled Vital Signs Assessment: post-procedure vital signs reviewed and stable Respiratory status: spontaneous breathing, nonlabored ventilation and respiratory function stable Cardiovascular status: blood pressure returned to baseline Postop Assessment: no signs of nausea or vomiting Anesthetic complications: no    Last Vitals:  Vitals:   06/17/16 0720 06/17/16 0730  BP: 126/74 132/74  Pulse:    Resp: (!) 23 18  Temp:      Last Pain:  Vitals:   06/17/16 0641  TempSrc: Oral                 Lamis Behrmann J

## 2016-06-17 NOTE — Discharge Instructions (Signed)
Basal Cell Carcinoma Basal cell carcinoma is the most common form of skin cancer. It begins in the basal cells, which are at the bottom of the outer skin layer (epidermis). It occurs most often on parts of the body that are frequently exposed to the sun, such as:  Parts of the head, including the scalp or face.  Ears.  Neck.  Arms or legs.  Backs of the hands. Basal cell carcinoma can almost always be cured. It rarely spreads to other areas of the body (metastasizes). Basal cell carcinoma may come back at the same location (recur), but it can be treated again if this occurs. CAUSES This condition is usually caused by exposure to ultraviolet (UV) light. UV light may come from the sun or from tanning beds. Other causes include:  Exposure to arsenic.  Exposure to radiation.  Exposure to toxic tars and oils.  Certain genetic conditions, such as xeroderma pigmentosum. RISK FACTORS This condition is more likely to develop in:  People who are older than 80 years of age.  People who have fair skin (light complexion).  People who have blonde or red hair.  People who have blue, green, or gray eyes.  People who have childhood freckling.  People who have had sun exposure over long periods of time, especially during childhood.  People who have had repeated sunburns.  People who have a weakened immune system.  People who have been exposed to certain chemicals, such as tar, soot, and arsenic.  People who have chronic inflammatory conditions.  People who have chronic infections.  People who use tanning beds. SYMPTOMS The main symptom of this condition is a growth or lesion on the skin. The shape and color of the growth or lesion may vary. The five main types include:  An open sore that may remain open for 3 weeks or longer. The sore may bleed or crust. This type of lesion can be an early sign of basal cell carcinoma. Basal cell carcinoma often shows up as a sore that does not  heal.  A reddish area that may crust, itch, or cause discomfort. This may occur on areas that are exposed to the sun. These patches might be easier to feel than to see.  A shiny or clear bump that is red, white, or pink. In people who have dark hair, the bump is often tan, black, or brown. These bumps can look like moles.  A pink growth with a raised border. The growth will have a crusted and indented area in the center. Small blood vessels may appear on the surface of the growth as it gets bigger.  A scarlike area that looks like shiny, stretched skin. The area may be white, yellow, or waxy. It often has irregular borders. This may be a sign of more aggressive basal cell carcinoma. DIAGNOSIS This condition may be diagnosed with:  A physical exam.  Removal of a tissue sample to be examined under a microscope (biopsy). TREATMENT Treatment for this condition involves removing the cancerous tissue. The method that is used for this depends on the type, size, location, and number of tumors. Possible treatments include:  Mohs surgery. In this procedure, the cancerous skin cells are removed layer by layer until all of the tumor has been removed.  Surgical removal (excision) of the tumor. This involves removing the entire tumor and a small amount of normal skin that surrounds it.  Cryosurgery. This involves freezing the tumor with liquid nitrogen.  Plastic surgery. The tumor is   removed, and healthy skin from another part of the body is used to cover the wound. This may be done for large tumors that are in areas where it is not possible to stretch the nearby skin to sew the edges of the wound together.  Radiation. This may be used for tumors on the face.  Photodynamic therapy. A chemical cream is applied to the skin, and light exposure is used to activate the chemical.  Electrodesiccation and curettage. This involves alternately scraping and burning the tumor while using an electric current to  control bleeding.  Chemical treatments, such as imiquimod cream and interferon injections. These may be used to remove superficial tumors with minimal scarring. HOME CARE INSTRUCTIONS  Avoid unprotected sun exposure.  Do self-exams as told by your health care provider. Look for new spots or changes in your skin.  Keep all follow-up visits as told by your health care provider. This is important. PREVENTION  Avoid the sun when it is the strongest. This is usually between 10:00 a.m. and 4:00 p.m.  When you are out in the sun, use a sunscreen that has a sun protection factor (SPF) of at least 30.  Apply sunscreen at least 30 minutes before exposure to the sun.  Reapply sunscreen every 2-4 hours while you are outside. Also reapply it after swimming and after excessive sweating.  Always wear hats, protective clothing, and UV-blocking sunglasses when you are outdoors.  Do not use tanning beds. SEEK MEDICAL CARE IF:  You notice any new spots or any changes in your skin.  You have had a basal cell carcinoma tumor removed and you notice a new growth in the same location.   This information is not intended to replace advice given to you by your health care provider. Make sure you discuss any questions you have with your health care provider.   Document Released: 04/20/2003 Document Revised: 07/05/2015 Document Reviewed: 02/06/2015 Elsevier Interactive Patient Education 2016 [employer]  

## 2016-06-17 NOTE — Addendum Note (Signed)
Addendum  created 06/17/16 0913 by Josephine Igo, MD   Sign clinical note

## 2016-06-17 NOTE — Op Note (Signed)
Patient:  [name]  DOB:  03-08-1930  MRN:  [id number]   Preop Diagnosis:  Basal cell carcinoma, left buttock  Skin tag, right buttock  Postop Diagnosis:  Same  Procedure:  Excision of basal cell carcinoma, left buttock  Shave removal of skin tag, right buttock  Surgeon:  Aviva Signs, M.D.  Anes:  Gen.  Indications:  Patient is an 82 year old white female who had an incomplete excision of a basal cell carcinoma of the left buttock by her dermatologist. Due to involved margins, the patient now presents for reexcision of the basal cell carcinoma on the left buttock. She also has a skin tag on the right buttock. The risks and benefits of the procedure including bleeding and infection were fully explained to the patient, who gave informed consent.  Procedure note:  The patient was placed in the right lateral decubitus position after general anesthesia was administered. The buttocks were prepped and draped using the usual sterile technique with Betadine. Surgical site confirmation was performed.  An elliptical incision was made around the left buttock region. A full-thickness excision of the malignancy was performed down to the subcutaneous tissue. A short suture was placed superiorly and a long suture placed laterally for orientation purposes. The specimen was sent to pathology further examination. A bleeding was controlled using Bovie electrocautery. The subcutaneous layer was reapproximated using a 4-0 Vicryl suture. The skin was closed using a 4-0 nylon vertical mattress suture. 0.5% Sensorcaine was instilled the surrounding wound. Betadine ointment and dry sterile dressing were applied.  The skin tag along the inferior aspect of the right buttock was shave removed using Bovie electrocautery. 0.5% Sensorcaine was instilled into the surrounding wound. Betadine ointment and a dry sterile dressing were applied.  All tape and needle counts were correct at the end of the procedure. The  patient was awakened and transferred to PACU in stable condition.  Complications:  None  EBL:  None  Specimen:  Left buttock basal cell carcinoma, skin tag right buttock

## 2016-06-17 NOTE — Transfer of Care (Signed)
Immediate Anesthesia Transfer of Care Note  Patient: [name]  Procedure(s) Performed: Procedure(s): EXCISION SKIN MALIGNANT LESION LEFT BUTTOCK (Left) SHAVE OF SKIN TAG RIGHT BUTTOCK (Right)  Patient Location: PACU  Anesthesia Type:General  Level of Consciousness: awake and patient cooperative  Airway & Oxygen Therapy: Patient Spontanous Breathing and Patient connected to face mask oxygen  Post-op Assessment: Report given to RN, Post -op Vital signs reviewed and stable and Patient moving all extremities  Post vital signs: Reviewed and stable  Last Vitals:  Vitals:   06/17/16 0720 06/17/16 0730  BP: 126/74 132/74  Pulse:    Resp: (!) 23 18  Temp:      Last Pain:  Vitals:   06/17/16 0641  TempSrc: Oral      Patients Stated Pain Goal: 5 ([id number])  Complications: No apparent anesthesia complications

## 2016-06-17 NOTE — Anesthesia Postprocedure Evaluation (Signed)
Anesthesia Post Note  Patient: SHYKERIA SUBLETT  Procedure(s) Performed: Procedure(s) (LRB): EXCISION SKIN MALIGNANT LESION LEFT BUTTOCK (Left) SHAVE OF SKIN TAG RIGHT BUTTOCK (Right)  Patient location during evaluation: PACU Anesthesia Type: General Level of consciousness: awake and alert and oriented Pain management: pain level controlled Vital Signs Assessment: post-procedure vital signs reviewed and stable Respiratory status: spontaneous breathing, nonlabored ventilation and respiratory function stable Cardiovascular status: blood pressure returned to baseline and stable Postop Assessment: no signs of nausea or vomiting Anesthetic complications: no    Last Vitals:  Vitals:   06/17/16 0858 06/17/16 0909  BP:  121/64  Pulse: 85 84  Resp: 16 18  Temp:  36.5 C    Last Pain:  Vitals:   06/17/16 0909  TempSrc: Oral                 Ivannah Zody A.

## 2016-06-17 NOTE — Anesthesia Preprocedure Evaluation (Signed)
Anesthesia Evaluation  Patient identified by MRN, date of birth, ID band Patient awake    Reviewed: Allergy & Precautions, NPO status , Patient's Chart, lab work & pertinent test results  Airway Mallampati: II  TM Distance: >3 FB Neck ROM: Full    Dental   Pulmonary pneumonia, resolved,    Pulmonary exam normal breath sounds clear to auscultation       Cardiovascular hypertension,  Rhythm:Regular Rate:Normal  EKG- NSR, anterolateral infarct   Neuro/Psych negative neurological ROS  negative psych ROS   GI/Hepatic Neg liver ROS, GERD  Medicated and Controlled,  Endo/Other  Hx/o hyperlipidemia  Renal/GU negative Renal ROS  negative genitourinary   Musculoskeletal  (+) Arthritis ,   Abdominal   Peds  Hematology  (+) anemia ,   Anesthesia Other Findings   Reproductive/Obstetrics                             Anesthesia Physical Anesthesia Plan  ASA: III  Anesthesia Plan: General   Post-op Pain Management:    Induction:   Airway Management Planned: LMA  Additional Equipment:   Intra-op Plan:   Post-operative Plan: Extubation in OR  Informed Consent: I have reviewed the patients History and Physical, chart, labs and discussed the procedure including the risks, benefits and alternatives for the proposed anesthesia with the patient or authorized representative who has indicated his/her understanding and acceptance.   Dental advisory given  Plan Discussed with: Anesthesiologist, CRNA and Surgeon  Anesthesia Plan Comments:         Anesthesia Quick Evaluation

## 2016-06-17 NOTE — Anesthesia Procedure Notes (Signed)
Procedure Name: LMA Insertion Date/Time: 06/17/2016 7:51 AM Performed by: Charmaine Downs Pre-anesthesia Checklist: Patient identified, Timeout performed, Emergency Drugs available, Suction available and Patient being monitored Patient Re-evaluated:Patient Re-evaluated prior to inductionOxygen Delivery Method: Circle System Utilized Preoxygenation: Pre-oxygenation with 100% oxygen Intubation Type: IV induction Ventilation: Mask ventilation without difficulty LMA: LMA inserted LMA Size: 4.0 Number of attempts: 1 Placement Confirmation: positive ETCO2 and breath sounds checked- equal and bilateral Tube secured with: Tape Dental Injury: Teeth and Oropharynx as per pre-operative assessment

## 2016-06-17 NOTE — Interval H&P Note (Signed)
History and Physical Interval Note:  06/17/2016 7:18 AM  [name]  has presented today for surgery, with the diagnosis of skin malignant lesion left buttock, skin tag right buttock  The various methods of treatment have been discussed with the patient and family. After consideration of risks, benefits and other options for treatment, the patient has consented to  Procedure(s): EXCISION SKIN MALIGNANT LESION LEFT BUTTOCK (Left) SHAVE OF SKIN TAG RIGHT BUTTOCK (Right) as a surgical intervention .  The patient's history has been reviewed, patient examined, no change in status, stable for surgery.  I have reviewed the patient's chart and labs.  Questions were answered to the patient's satisfaction.     Aviva Signs A

## 2016-06-20 ENCOUNTER — Encounter (HOSPITAL_COMMUNITY): Payer: Self-pay | Admitting: General Surgery

## 2016-07-18 DIAGNOSIS — H04123 Dry eye syndrome of bilateral lacrimal glands: Secondary | ICD-10-CM | POA: Diagnosis not present

## 2016-07-18 DIAGNOSIS — Z961 Presence of intraocular lens: Secondary | ICD-10-CM | POA: Diagnosis not present

## 2016-07-22 DIAGNOSIS — E782 Mixed hyperlipidemia: Secondary | ICD-10-CM | POA: Diagnosis not present

## 2016-07-22 DIAGNOSIS — E559 Vitamin D deficiency, unspecified: Secondary | ICD-10-CM | POA: Diagnosis not present

## 2016-07-22 DIAGNOSIS — R7301 Impaired fasting glucose: Secondary | ICD-10-CM | POA: Diagnosis not present

## 2016-07-24 DIAGNOSIS — Z0000 Encounter for general adult medical examination without abnormal findings: Secondary | ICD-10-CM | POA: Diagnosis not present

## 2016-07-24 DIAGNOSIS — E782 Mixed hyperlipidemia: Secondary | ICD-10-CM | POA: Diagnosis not present

## 2016-07-24 DIAGNOSIS — Z23 Encounter for immunization: Secondary | ICD-10-CM | POA: Diagnosis not present

## 2016-07-24 DIAGNOSIS — I10 Essential (primary) hypertension: Secondary | ICD-10-CM | POA: Diagnosis not present

## 2016-07-24 DIAGNOSIS — R7301 Impaired fasting glucose: Secondary | ICD-10-CM | POA: Diagnosis not present

## 2016-07-24 DIAGNOSIS — E559 Vitamin D deficiency, unspecified: Secondary | ICD-10-CM | POA: Diagnosis not present

## 2017-01-20 DIAGNOSIS — I10 Essential (primary) hypertension: Secondary | ICD-10-CM | POA: Diagnosis not present

## 2017-01-20 DIAGNOSIS — R7301 Impaired fasting glucose: Secondary | ICD-10-CM | POA: Diagnosis not present

## 2017-01-20 DIAGNOSIS — E559 Vitamin D deficiency, unspecified: Secondary | ICD-10-CM | POA: Diagnosis not present

## 2017-01-22 DIAGNOSIS — N182 Chronic kidney disease, stage 2 (mild): Secondary | ICD-10-CM | POA: Diagnosis not present

## 2017-01-22 DIAGNOSIS — E559 Vitamin D deficiency, unspecified: Secondary | ICD-10-CM | POA: Diagnosis not present

## 2017-01-22 DIAGNOSIS — E782 Mixed hyperlipidemia: Secondary | ICD-10-CM | POA: Diagnosis not present

## 2017-01-22 DIAGNOSIS — I10 Essential (primary) hypertension: Secondary | ICD-10-CM | POA: Diagnosis not present

## 2017-01-22 DIAGNOSIS — R7301 Impaired fasting glucose: Secondary | ICD-10-CM | POA: Diagnosis not present

## 2017-03-01 ENCOUNTER — Other Ambulatory Visit: Payer: Self-pay | Admitting: General Surgery

## 2017-06-25 DIAGNOSIS — Z6829 Body mass index (BMI) 29.0-29.9, adult: Secondary | ICD-10-CM | POA: Diagnosis not present

## 2017-06-25 DIAGNOSIS — M79673 Pain in unspecified foot: Secondary | ICD-10-CM | POA: Diagnosis not present

## 2017-06-25 DIAGNOSIS — E1140 Type 2 diabetes mellitus with diabetic neuropathy, unspecified: Secondary | ICD-10-CM | POA: Diagnosis not present

## 2017-07-10 DIAGNOSIS — R600 Localized edema: Secondary | ICD-10-CM | POA: Diagnosis not present

## 2017-07-10 DIAGNOSIS — Z6829 Body mass index (BMI) 29.0-29.9, adult: Secondary | ICD-10-CM | POA: Diagnosis not present

## 2017-07-10 DIAGNOSIS — E1121 Type 2 diabetes mellitus with diabetic nephropathy: Secondary | ICD-10-CM | POA: Diagnosis not present

## 2017-07-21 DIAGNOSIS — Z6829 Body mass index (BMI) 29.0-29.9, adult: Secondary | ICD-10-CM | POA: Diagnosis not present

## 2017-07-21 DIAGNOSIS — L539 Erythematous condition, unspecified: Secondary | ICD-10-CM | POA: Diagnosis not present

## 2017-07-21 DIAGNOSIS — G9009 Other idiopathic peripheral autonomic neuropathy: Secondary | ICD-10-CM | POA: Diagnosis not present

## 2017-07-24 DIAGNOSIS — E1165 Type 2 diabetes mellitus with hyperglycemia: Secondary | ICD-10-CM | POA: Diagnosis not present

## 2017-07-24 DIAGNOSIS — E559 Vitamin D deficiency, unspecified: Secondary | ICD-10-CM | POA: Diagnosis not present

## 2017-07-24 DIAGNOSIS — I10 Essential (primary) hypertension: Secondary | ICD-10-CM | POA: Diagnosis not present

## 2017-07-28 DIAGNOSIS — R7301 Impaired fasting glucose: Secondary | ICD-10-CM | POA: Diagnosis not present

## 2017-07-28 DIAGNOSIS — Z23 Encounter for immunization: Secondary | ICD-10-CM | POA: Diagnosis not present

## 2017-07-28 DIAGNOSIS — E559 Vitamin D deficiency, unspecified: Secondary | ICD-10-CM | POA: Diagnosis not present

## 2017-07-28 DIAGNOSIS — N183 Chronic kidney disease, stage 3 (moderate): Secondary | ICD-10-CM | POA: Diagnosis not present

## 2017-07-28 DIAGNOSIS — E782 Mixed hyperlipidemia: Secondary | ICD-10-CM | POA: Diagnosis not present

## 2017-07-28 DIAGNOSIS — I10 Essential (primary) hypertension: Secondary | ICD-10-CM | POA: Diagnosis not present

## 2017-07-28 DIAGNOSIS — Z0000 Encounter for general adult medical examination without abnormal findings: Secondary | ICD-10-CM | POA: Diagnosis not present

## 2017-08-29 DIAGNOSIS — B0229 Other postherpetic nervous system involvement: Secondary | ICD-10-CM | POA: Diagnosis not present

## 2017-08-29 DIAGNOSIS — Z6828 Body mass index (BMI) 28.0-28.9, adult: Secondary | ICD-10-CM | POA: Diagnosis not present

## 2017-08-29 DIAGNOSIS — L539 Erythematous condition, unspecified: Secondary | ICD-10-CM | POA: Diagnosis not present

## 2017-09-05 DIAGNOSIS — L539 Erythematous condition, unspecified: Secondary | ICD-10-CM | POA: Diagnosis not present

## 2017-09-16 DIAGNOSIS — L539 Erythematous condition, unspecified: Secondary | ICD-10-CM | POA: Diagnosis not present

## 2017-09-16 DIAGNOSIS — Z6828 Body mass index (BMI) 28.0-28.9, adult: Secondary | ICD-10-CM | POA: Diagnosis not present

## 2017-09-16 DIAGNOSIS — R2243 Localized swelling, mass and lump, lower limb, bilateral: Secondary | ICD-10-CM | POA: Diagnosis not present

## 2017-10-23 DIAGNOSIS — L0109 Other impetigo: Secondary | ICD-10-CM | POA: Diagnosis not present

## 2017-10-23 DIAGNOSIS — L0889 Other specified local infections of the skin and subcutaneous tissue: Secondary | ICD-10-CM | POA: Diagnosis not present

## 2017-10-23 DIAGNOSIS — L309 Dermatitis, unspecified: Secondary | ICD-10-CM | POA: Diagnosis not present

## 2017-10-23 DIAGNOSIS — Z85828 Personal history of other malignant neoplasm of skin: Secondary | ICD-10-CM | POA: Diagnosis not present

## 2017-11-18 DIAGNOSIS — Z85828 Personal history of other malignant neoplasm of skin: Secondary | ICD-10-CM | POA: Diagnosis not present

## 2017-11-18 DIAGNOSIS — L309 Dermatitis, unspecified: Secondary | ICD-10-CM | POA: Diagnosis not present

## 2017-11-18 DIAGNOSIS — B353 Tinea pedis: Secondary | ICD-10-CM | POA: Diagnosis not present

## 2017-11-24 ENCOUNTER — Encounter: Payer: Self-pay | Admitting: Neurology

## 2017-11-24 ENCOUNTER — Ambulatory Visit: Payer: Medicare HMO | Admitting: Neurology

## 2017-11-24 DIAGNOSIS — G629 Polyneuropathy, unspecified: Secondary | ICD-10-CM | POA: Diagnosis not present

## 2017-11-24 DIAGNOSIS — R202 Paresthesia of skin: Secondary | ICD-10-CM | POA: Diagnosis not present

## 2017-11-24 DIAGNOSIS — D649 Anemia, unspecified: Secondary | ICD-10-CM | POA: Diagnosis not present

## 2017-11-24 MED ORDER — NORTRIPTYLINE HCL 10 MG PO CAPS: 30.0000 mg | ORAL_CAPSULE | Freq: Every day | ORAL | 3 refills | Status: DC

## 2017-11-24 NOTE — Progress Notes (Signed)
PATIENT: [name] DOB: 11/16/1929  Chief Complaint  Patient presents with  . Possible neuropathy    She is here with her husband, Ron, for an evaluation of pain, burning and tingling in her bilateral feet. Symptoms are preventing her from wearing closed-toe shoes or pulling covers over her feet at night. She has tried gabapentin, amitriptyline and lidocaine (does not remember the doses).  She then tried Lyrica 100mg  at bedtime without relief.  There is also a rash present that she is using triamcinolone acetonide topical cream to treat.  She was on Lasix for swelling but this has resolved.  Marland Kitchen PCP    [name], MD  . Dermatology    [name], MD - referring MD     HISTORICAL  [name] is a 84 year old female, accompanied by her husband, seen in refer by herWhitworth, Thayer Jew, MD  primary care doctor Dr. Nevada Crane, Jenny Reichmann for evaluation  bilateral feet paresthesia, initial evaluation was on November 24, 2017.  Reviewed and summarized the referring note, she had a history of bilateral knee replacement, otherwise quite healthy,  Around August 2018, she noticed bilateral top of feet achy pain, itching, with blisters breakout, it is on the lateral part of both feet, she was diagnosed with possible shingles, was treated with topical cream without helping her symptoms,  The rash and blister went on for months, then began to spread to distal leg, even the trunk of her body, she describes itching, burning sensation,  She was seen by dermatologist Dr. Elvera Lennox reviewing the chart, November 18, 2017: skin sample suggestive of tinea pedis, she was given prescription of Terbinafine 250 mg daily for 1 month,  She now noticed itching, rash, burning sensation spread to knee level, some rash at the spine, trunk level, she has difficulty sleeping because bilateral feet discomfort, could not tolerate bedsheet covering her foot,  She denies bilateral hands involvement, no significant low back  pain no bowel bladder incontinence.  Over the past few months, she was treated with Lyrica up to 100 mg, gabapentin, did not help, also tried muscle relaxant Robaxin, transdermal cream with limited help.  REVIEW OF SYSTEMS: Full 14 system review of systems performed and notable only for as above  ALLERGIES: Allergies  Allergen Reactions  . Penicillins Rash    HOME MEDICATIONS: Current Outpatient Medications  Medication Sig Dispense Refill  . aspirin 81 MG tablet Take 81 mg by mouth daily.    Marland Kitchen b complex vitamins tablet Take 1 tablet by mouth daily.    . cholecalciferol (VITAMIN D) 1000 UNITS tablet Take 1,000 Units by mouth daily.    . Cyanocobalamin (VITAMIN B-12 PO) Take by mouth daily.    Marland Kitchen losartan (COZAAR) 100 MG tablet Take 100 mg by mouth daily.     . Omega-3 Fatty Acids (FISH OIL PO) Take 2 capsules by mouth daily.     . vitamin E 400 UNIT capsule Take 800 Units by mouth daily.     No current facility-administered medications for this visit.     PAST MEDICAL HISTORY: Past Medical History:  Diagnosis Date  . GERD (gastroesophageal reflux disease)    occasional   . Hyperlipidemia   . Hypertension    clearance with note Dr Nevada Crane on chart  . Pneumonia    2 years ago/ states occ cough with sinus drainage- no fever  . Thyroid nodule    with biopsy- states following medically    PAST SURGICAL HISTORY: Past Surgical  History:  Procedure Laterality Date  . CATARACT EXTRACTION W/PHACO  11/30/2012   Procedure: CATARACT EXTRACTION PHACO AND INTRAOCULAR LENS PLACEMENT (IOC);  Surgeon: Williams Che, MD;  Location: [facility];  Service: Ophthalmology;  Laterality: Left;  CDE:11.49  . CATARACT EXTRACTION W/PHACO Right 03/15/2013   Procedure: CATARACT EXTRACTION PHACO AND INTRAOCULAR LENS PLACEMENT (IOC);  Surgeon: Williams Che, MD;  Location: [facility];  Service: Ophthalmology;  Laterality: Right;  CDE 16.15  . COLONOSCOPY    . EXCISION OF SKIN TAG Right 06/17/2016   Procedure:  SHAVE OF SKIN TAG RIGHT BUTTOCK;  Surgeon: Aviva Signs, MD;  Location: [facility];  Service: General;  Laterality: Right;  . JOINT REPLACEMENT     left knee  . MASS EXCISION Left 06/17/2016   Procedure: EXCISION SKIN MALIGNANT LESION LEFT BUTTOCK;  Surgeon: Aviva Signs, MD;  Location: [facility];  Service: General;  Laterality: Left;  . TOTAL KNEE ARTHROPLASTY  12/16/2011   Procedure: TOTAL KNEE ARTHROPLASTY;  Surgeon: Gearlean Alf, MD;  Location: [facility];  Service: Orthopedics;  Laterality: Right;  . TUBAL LIGATION      FAMILY HISTORY: Family History  Problem Relation Age of Onset  . Heart attack Mother   . Bronchitis Father     SOCIAL HISTORY:  Social History   Socioeconomic History  . Marital status: Married    Spouse name: Not on file  . Number of children: 2  . Years of education: some business school after HS  . Highest education level: Not on file  Social Needs  . Financial resource strain: Not on file  . Food insecurity - worry: Not on file  . Food insecurity - inability: Not on file  . Transportation needs - medical: Not on file  . Transportation needs - non-medical: Not on file  Occupational History  . Not on file  Tobacco Use  . Smoking status: Never Smoker  . Smokeless tobacco: Never Used  Substance and Sexual Activity  . Alcohol use: No    Frequency: Never  . Drug use: No  . Sexual activity: Yes    Birth control/protection: None  Other Topics Concern  . Not on file  Social History Narrative   Lives at home with her husband.   4 cups caffeine per day.   Right-handed.     PHYSICAL EXAM   Vitals:   11/24/17 1520  BP: (!) 150/87  Pulse: (!) 107  Weight: 172 lb (78 kg)  Height: 5\' 4"  (1.626 m)    Not recorded      Body mass index is 29.52 kg/m.  PHYSICAL EXAMNIATION:  Gen: NAD, conversant, well nourised, obese, well groomed                     Cardiovascular: Regular rate rhythm, no peripheral edema, warm, nontender. Eyes: Conjunctivae clear  without exudates or hemorrhage Neck: Supple, no carotid bruits. Pulmonary: Clear to auscultation bilaterally   NEUROLOGICAL EXAM:  MENTAL STATUS: Speech:    Speech is normal; fluent and spontaneous with normal comprehension.  Cognition:     Orientation to time, place and person     Normal recent and remote memory     Normal Attention span and concentration     Normal Language, naming, repeating,spontaneous speech     Fund of knowledge   CRANIAL NERVES: CN II: Visual fields are full to confrontation. Fundoscopic exam is normal with sharp discs and no vascular changes. Pupils are round equal and briskly reactive  to light. CN III, IV, VI: extraocular movement are normal. No ptosis. CN V: Facial sensation is intact to pinprick in all 3 divisions bilaterally. Corneal responses are intact.  CN VII: Face is symmetric with normal eye closure and smile. CN VIII: Hearing is normal to rubbing fingers CN IX, X: Palate elevates symmetrically. Phonation is normal. CN XI: Head turning and shoulder shrug are intact CN XII: Tongue is midline with normal movements and no atrophy.  MOTOR: Bilateral feet rash, scaly skin discoloration, especially at lateral top and heel, mild bilateral lower extremity edema decreased vibratory sensation to mid shin level, length dependent decreased light touch pinprick to mid shin level  REFLEXES: Reflexes are 2+ and symmetric at the biceps, triceps, knees, and trace at ankles. Plantar responses are flexor.  SENSORY: Intact to light touch, pinprick, positional sensation and vibratory sensation are intact in fingers and toes.  COORDINATION: Rapid alternating movements and fine finger movements are intact. There is no dysmetria on finger-to-nose and heel-knee-shin.    GAIT/STANCE: Posture is normal. Gait is steady with normal steps, base, arm swing, and turning. Heel and toe walking are normal. Tandem gait is normal.  Romberg is absent.   DIAGNOSTIC DATA (LABS,  IMAGING, TESTING) - I reviewed patient records, labs, notes, testing and imaging myself where available.   ASSESSMENT AND PLAN  KATRESE SHELL is a 84 y.o. female     Bilateral lower extremity rash, mild less dependent sensory changes  She might have superimposed axonal peripheral neuropathy  But per history, and development of rash is not typical for peripheral neuropathy  Proceed with laboratory evaluation for potential etiology including inflammatory markers  EMG nerve conduction study  Nortriptyline 10 mg titrating to 30 mg every night  Marcial Pacas, M.D. Ph.D.  [facility] [address] [place], [place] [address] Ph: [phone] Fax: [phone]  CC: [name], MD

## 2017-11-26 LAB — CBC WITH DIFFERENTIAL/PLATELET
BASOS ABS: 0 10*3/uL (ref 0.0–0.2)
BASOS: 0 %
EOS (ABSOLUTE): 0.1 10*3/uL (ref 0.0–0.4)
Eos: 1 %
Hematocrit: 36.6 % (ref 34.0–46.6)
Hemoglobin: 12.7 g/dL (ref 11.1–15.9)
IMMATURE GRANS (ABS): 0 10*3/uL (ref 0.0–0.1)
IMMATURE GRANULOCYTES: 0 %
LYMPHS: 20 %
Lymphocytes Absolute: 2.5 10*3/uL (ref 0.7–3.1)
MCH: 30.7 pg (ref 26.6–33.0)
MCHC: 34.7 g/dL (ref 31.5–35.7)
MCV: 88 fL (ref 79–97)
MONOS ABS: 0.9 10*3/uL (ref 0.1–0.9)
Monocytes: 7 %
NEUTROS PCT: 72 %
Neutrophils Absolute: 9.1 10*3/uL — ABNORMAL HIGH (ref 1.4–7.0)
Platelets: 420 10*3/uL — ABNORMAL HIGH (ref 150–379)
RBC: 4.14 x10E6/uL (ref 3.77–5.28)
RDW: 15.4 % (ref 12.3–15.4)
WBC: 12.7 10*3/uL — AB (ref 3.4–10.8)

## 2017-11-26 LAB — RPR: RPR Ser Ql: NONREACTIVE

## 2017-11-26 LAB — PROTEIN ELECTROPHORESIS
A/G Ratio: 0.8 (ref 0.7–1.7)
Albumin ELP: 3.2 g/dL (ref 2.9–4.4)
Alpha 1: 0.3 g/dL (ref 0.0–0.4)
Alpha 2: 1 g/dL (ref 0.4–1.0)
Beta: 1.4 g/dL — ABNORMAL HIGH (ref 0.7–1.3)
GLOBULIN, TOTAL: 3.8 g/dL (ref 2.2–3.9)
Gamma Globulin: 1.1 g/dL (ref 0.4–1.8)
TOTAL PROTEIN: 7 g/dL (ref 6.0–8.5)

## 2017-11-26 LAB — HGB A1C W/O EAG: HEMOGLOBIN A1C: 6 % — AB (ref 4.8–5.6)

## 2017-11-26 LAB — ANA W/REFLEX IF POSITIVE: ANA: NEGATIVE

## 2017-11-26 LAB — SEDIMENTATION RATE: SED RATE: 18 mm/h (ref 0–40)

## 2017-11-26 LAB — VITAMIN D 25 HYDROXY (VIT D DEFICIENCY, FRACTURES): Vit D, 25-Hydroxy: 23.1 ng/mL — ABNORMAL LOW (ref 30.0–100.0)

## 2017-11-26 LAB — TSH: TSH: 2.14 u[IU]/mL (ref 0.450–4.500)

## 2017-11-26 LAB — FOLATE: Folate: >20 ng/mL (ref >3.0)

## 2017-11-26 LAB — COPPER, SERUM: COPPER: 114 ug/dL (ref 72–166)

## 2017-11-26 LAB — CK: Total CK: 57 U/L (ref 24–173)

## 2017-11-26 LAB — C-REACTIVE PROTEIN: CRP: 12.2 mg/L — ABNORMAL HIGH (ref 0.0–4.9)

## 2017-11-26 LAB — FERRITIN: FERRITIN: 195 ng/mL — AB (ref 15–150)

## 2017-11-26 LAB — B. BURGDORFI ANTIBODIES: Lyme IgG/IgM Ab: <0.91 {ISR} (ref 0.00–0.90)

## 2017-11-26 LAB — VITAMIN B12

## 2017-11-27 ENCOUNTER — Telehealth: Payer: Self-pay | Admitting: Neurology

## 2017-11-27 NOTE — Telephone Encounter (Addendum)
Spoke to patient - she is aware of her lab results and recommendations.  States she has mild congestion and cough.  She will follow up with her PCP.

## 2017-11-27 NOTE — Telephone Encounter (Signed)
Please call patient, extensive laboratory evaluation showed elevated A1c 6.0, this indicated elevated average glucose level in the past few months, she would benefit exercise, diet control, continue to follow-up with her primary care physician, will forward lab results over.  Mild elevated WBC 12, ask her to see if she has any other signs of infection such as UTI, upper respiratory infection, if not, can be related to her current skin condition, may consider repeat laboratory evaluation later  No also elevated ferritin, C-reactive protein, indicating inflammatory process.  Continue current treatment recommended by her dermatologist,  Keep follow-up appointment on December 12, 2017

## 2017-12-12 ENCOUNTER — Ambulatory Visit (INDEPENDENT_AMBULATORY_CARE_PROVIDER_SITE_OTHER): Payer: Medicare HMO | Admitting: Neurology

## 2017-12-12 ENCOUNTER — Telehealth: Payer: Self-pay | Admitting: Neurology

## 2017-12-12 ENCOUNTER — Ambulatory Visit: Payer: Medicare HMO | Admitting: Neurology

## 2017-12-12 DIAGNOSIS — Z0289 Encounter for other administrative examinations: Secondary | ICD-10-CM

## 2017-12-12 DIAGNOSIS — R202 Paresthesia of skin: Secondary | ICD-10-CM | POA: Diagnosis not present

## 2017-12-12 DIAGNOSIS — G629 Polyneuropathy, unspecified: Secondary | ICD-10-CM

## 2017-12-12 NOTE — Procedures (Signed)
Full Name: [name] Gender: Female MRN #: [id number] Date of Birth: December 24, 2029    Visit Date: 12/12/17 10:00 Age: 84 Years 9 Months Old Examining Physician: Marcial Pacas, MD  Referring Physician: Krista Blue, MD  History: 84 year old female, complains of bilateral lower extremities, feet paresthesia, burning sensation, skin rash.  Summary of the tests:  Nerve conduction study: Bilateral sural, superficial peroneal sensory responses were absent.  Bilateral tibial motor responses were normal.  Right peroneal to EDB motor responses showed borderline C map amplitude.  Left peroneal to EDB motor responses showed moderately decreased C map amplitude. Right ulnar sensory and motor responses were normal.  Electromyography: Selective needle examinations of bilateral right extremity muscles, and right lumbosacral paraspinals showed no significant abnormality.   Conclusion: This is an abnormal study.  There is electrodiagnostic evidence of length dependent axonal sensorimotor polyneuropathy.  There is no evidence of right lumbosacral radiculopathy.    -------------------------------  Marcial Pacas. M.D. Ph.D.  [facility] [place], [place] [address] Tel: [phone] Fax: [phone]        [facility]    Nerve / Sites Muscle Latency Ref. Amplitude Ref. Rel Amp Segments Distance Velocity Ref. Area    ms ms mV mV %  cm m/s m/s mVms  R Ulnar - ADM     Wrist ADM 2.8 ?3.3 7.8 ?6.0 100 Wrist - ADM 7   28.8     B.Elbow ADM 6.0  6.9  88.1 B.Elbow - Wrist 18 56 ?49 27.4     A.Elbow ADM 7.8  7.5  109 A.Elbow - B.Elbow 10 58 ?49 29.9         A.Elbow - Wrist      R Peroneal - EDB     Ankle EDB 4.2 ?6.5 2.0 ?2.0 100 Ankle - EDB 9   6.3     Fib head EDB 10.9  1.8  90.3 Fib head - Ankle 30 44 ?44 5.4     Pop fossa EDB 13.6  1.9  108 Pop fossa - Fib head 12 44 ?44 6.9         Pop fossa - Ankle      L Peroneal - EDB     Ankle EDB 5.1 ?6.5 1.0 ?2.0 100 Ankle - EDB 9   3.3    Fib head EDB 11.9  0.5  55.9 Fib head - Ankle 30 44 ?44 1.6     Pop fossa EDB 14.1  0.5  83 Pop fossa - Fib head 10 45 ?44 1.3         Pop fossa - Ankle      R Tibial - AH     Ankle AH 4.3 ?5.8 8.6 ?4.0 100 Ankle - AH 9   23.9     Pop fossa AH 13.0  8.1  93.7 Pop fossa - Ankle 36 41 ?41 26.9  L Tibial - AH     Ankle AH 4.5 ?5.8 14.2 ?4.0 100 Ankle - AH 9   34.6     Pop fossa AH 13.2  8.2  57.9 Pop fossa - Ankle 36 41 ?41 22.7               SNC    Nerve / Sites Rec. Site Peak Lat Ref.  Amp Ref. Segments Distance    ms ms V V  cm  R Radial - Anatomical snuff box (Forearm)     Forearm Wrist 2.9 ?2.9 13 ?15 Forearm -  Wrist 10  R Sural - Ankle (Calf)     Calf Ankle NR ?4.4 NR ?6 Calf - Ankle 14  L Sural - Ankle (Calf)     Calf Ankle NR ?4.4 NR ?6 Calf - Ankle 14  R Superficial peroneal - Ankle     Lat leg Ankle NR ?4.4 NR ?6 Lat leg - Ankle 14  L Superficial peroneal - Ankle     Lat leg Ankle NR ?4.4 NR ?6 Lat leg - Ankle 14  R Ulnar - Orthodromic, (Dig V, Mid palm)     Dig V Wrist 2.3 ?3.1 10 ?5 Dig V - Wrist 30                 F  Wave    Nerve F Lat Ref.   ms ms  R Tibial - AH 53.2 ?56.0  L Tibial - AH 53.4 ?56.0  R Ulnar - ADM 27.4 ?32.0           EMG full       EMG Summary Table    Spontaneous MUAP Recruitment  Muscle IA Fib PSW Fasc Other Amp Dur. Poly Pattern  R. Tibialis anterior Normal None None None _______ Normal Normal Normal Normal  R. Tibialis posterior Normal None None None _______ Normal Normal Normal Normal  R. Peroneus longus Normal None None None _______ Normal Normal Normal Normal  R. Vastus lateralis Normal None None None _______ Normal Normal Normal Normal  R. Gastrocnemius (Medial head) Normal None None None _______ Normal Normal Normal Normal  R. Biceps femoris (short head) Normal None None None _______ Normal Normal Normal Normal  R. Lumbar paraspinals (mid) Normal None None None _______ Normal Normal Normal Normal  R. Lumbar paraspinals (low)  Normal None None None _______ Normal Normal Normal Normal

## 2017-12-12 NOTE — Telephone Encounter (Signed)
ok 

## 2017-12-19 DIAGNOSIS — Z85828 Personal history of other malignant neoplasm of skin: Secondary | ICD-10-CM | POA: Diagnosis not present

## 2017-12-19 DIAGNOSIS — I872 Venous insufficiency (chronic) (peripheral): Secondary | ICD-10-CM | POA: Diagnosis not present

## 2017-12-19 DIAGNOSIS — B353 Tinea pedis: Secondary | ICD-10-CM | POA: Diagnosis not present

## 2017-12-19 DIAGNOSIS — Z79899 Other long term (current) drug therapy: Secondary | ICD-10-CM | POA: Diagnosis not present

## 2017-12-19 DIAGNOSIS — I8311 Varicose veins of right lower extremity with inflammation: Secondary | ICD-10-CM | POA: Diagnosis not present

## 2017-12-19 DIAGNOSIS — I8312 Varicose veins of left lower extremity with inflammation: Secondary | ICD-10-CM | POA: Diagnosis not present

## 2018-01-21 ENCOUNTER — Encounter (HOSPITAL_COMMUNITY): Payer: Self-pay

## 2018-01-21 ENCOUNTER — Emergency Department (HOSPITAL_COMMUNITY)
Admission: EM | Admit: 2018-01-21 | Discharge: 2018-01-21 | Disposition: A | Discharge status: Home / Self Care | Payer: Medicare HMO | Attending: Emergency Medicine | Admitting: Emergency Medicine

## 2018-01-21 ENCOUNTER — Other Ambulatory Visit: Payer: Self-pay

## 2018-01-21 DIAGNOSIS — R42 Dizziness and giddiness: Secondary | ICD-10-CM | POA: Diagnosis not present

## 2018-01-21 DIAGNOSIS — Z79899 Other long term (current) drug therapy: Secondary | ICD-10-CM | POA: Diagnosis not present

## 2018-01-21 DIAGNOSIS — R55 Syncope and collapse: Secondary | ICD-10-CM

## 2018-01-21 DIAGNOSIS — R61 Generalized hyperhidrosis: Secondary | ICD-10-CM | POA: Diagnosis not present

## 2018-01-21 DIAGNOSIS — Z7982 Long term (current) use of aspirin: Secondary | ICD-10-CM | POA: Insufficient documentation

## 2018-01-21 DIAGNOSIS — R404 Transient alteration of awareness: Secondary | ICD-10-CM | POA: Diagnosis not present

## 2018-01-21 DIAGNOSIS — I10 Essential (primary) hypertension: Secondary | ICD-10-CM | POA: Insufficient documentation

## 2018-01-21 LAB — CBC WITH DIFFERENTIAL/PLATELET
BASOS ABS: 0 10*3/uL (ref 0.0–0.1)
BASOS PCT: 0 %
Eosinophils Absolute: 0.1 10*3/uL (ref 0.0–0.7)
Eosinophils Relative: 1 %
HEMATOCRIT: 32.7 % — AB (ref 36.0–46.0)
HEMOGLOBIN: 10.9 g/dL — AB (ref 12.0–15.0)
Lymphocytes Relative: 8 %
Lymphs Abs: 1.1 10*3/uL (ref 0.7–4.0)
MCH: 29.8 pg (ref 26.0–34.0)
MCHC: 33.3 g/dL (ref 30.0–36.0)
MCV: 89.3 fL (ref 78.0–100.0)
MONO ABS: 0.8 10*3/uL (ref 0.1–1.0)
Monocytes Relative: 6 %
NEUTROS ABS: 11.1 10*3/uL — AB (ref 1.7–7.7)
NEUTROS PCT: 85 %
Platelets: 361 10*3/uL (ref 150–400)
RBC: 3.66 MIL/uL — ABNORMAL LOW (ref 3.87–5.11)
RDW: 14.6 % (ref 11.5–15.5)
WBC: 13.1 10*3/uL — ABNORMAL HIGH (ref 4.0–10.5)

## 2018-01-21 LAB — COMPREHENSIVE METABOLIC PANEL
ALT: 18 U/L (ref 14–54)
ANION GAP: 10 (ref 5–15)
AST: 19 U/L (ref 15–41)
Albumin: 3.2 g/dL — ABNORMAL LOW (ref 3.5–5.0)
Alkaline Phosphatase: 80 U/L (ref 38–126)
BILIRUBIN TOTAL: 0.4 mg/dL (ref 0.3–1.2)
BUN: 27 mg/dL — ABNORMAL HIGH (ref 6–20)
CO2: 22 mmol/L (ref 22–32)
Calcium: 8.9 mg/dL (ref 8.9–10.3)
Chloride: 99 mmol/L — ABNORMAL LOW (ref 101–111)
Creatinine, Ser: 1.04 mg/dL — ABNORMAL HIGH (ref 0.44–1.00)
GFR calc non Af Amer: 48 mL/min — ABNORMAL LOW (ref >60)
GFR, EST AFRICAN AMERICAN: 55 mL/min — AB (ref >60)
Glucose, Bld: 138 mg/dL — ABNORMAL HIGH (ref 65–99)
Potassium: 4.6 mmol/L (ref 3.5–5.1)
Sodium: 131 mmol/L — ABNORMAL LOW (ref 135–145)
TOTAL PROTEIN: 6.6 g/dL (ref 6.5–8.1)

## 2018-01-21 LAB — URINALYSIS, ROUTINE W REFLEX MICROSCOPIC
BILIRUBIN URINE: NEGATIVE
GLUCOSE, UA: NEGATIVE mg/dL
HGB URINE DIPSTICK: NEGATIVE
Ketones, ur: NEGATIVE mg/dL
NITRITE: POSITIVE — AB
Protein, ur: 100 mg/dL — AB
SPECIFIC GRAVITY, URINE: 1.014 (ref 1.005–1.030)
pH: 6 (ref 5.0–8.0)

## 2018-01-21 LAB — TROPONIN I: Troponin I: <0.03 ng/mL (ref <0.03)

## 2018-01-21 NOTE — ED Notes (Signed)
Pt and family updated on plan of care, denies any complaints, states that she is feeling better

## 2018-01-21 NOTE — ED Notes (Signed)
Dr Pickering at bedside,  

## 2018-01-21 NOTE — ED Provider Notes (Signed)
[facility] EMERGENCY DEPARTMENT Provider Note   CSN: [id number] Arrival date & time: 01/21/18  1609     History   Chief Complaint Chief Complaint  Patient presents with  . Near Syncope    HPI [name] is a 84 y.o. female.  HPI Patient presents with near syncopal episode.  Was at home bending over and felt as if she is going to pass out.  She sat down and was sitting on the ground for a while.  Stood up and had blood pressure was 70/40 and was diaphoretic.  Felt generally bad.  No chest pain or trouble breathing.  EMS states that she was hypotensive and looked bad.  No fevers or chills.  Is been doing well the last couple days.  No diarrhea.  No constipation.  No blood in the stool.  She has not had episodes like this before.  No headaches. Past Medical History:  Diagnosis Date  . GERD (gastroesophageal reflux disease)    occasional   . Hyperlipidemia   . Hypertension    clearance with note Dr Nevada Crane on chart  . Pneumonia    2 years ago/ states occ cough with sinus drainage- no fever  . Thyroid nodule    with biopsy- states following medically    Patient Active Problem List   Diagnosis Date Noted  . Peripheral polyneuropathy 12/12/2017  . Paresthesia 11/24/2017  . Postop Anemia 12/19/2011  . Postop Hyponatremia 12/17/2011  . OA (osteoarthritis) of knee 12/16/2011  . KNEE, ARTHRITIS, DEGEN./OSTEO 10/10/2010  . TEAR MEDIAL MENISCUS 10/10/2010  . TEAR LATERAL MENISCUS 10/10/2010    Past Surgical History:  Procedure Laterality Date  . CATARACT EXTRACTION W/PHACO  11/30/2012   Procedure: CATARACT EXTRACTION PHACO AND INTRAOCULAR LENS PLACEMENT (IOC);  Surgeon: Williams Che, MD;  Location: [facility];  Service: Ophthalmology;  Laterality: Left;  CDE:11.49  . CATARACT EXTRACTION W/PHACO Right 03/15/2013   Procedure: CATARACT EXTRACTION PHACO AND INTRAOCULAR LENS PLACEMENT (IOC);  Surgeon: Williams Che, MD;  Location: [facility];  Service: Ophthalmology;  Laterality: Right;   CDE 16.15  . COLONOSCOPY    . EXCISION OF SKIN TAG Right 06/17/2016   Procedure: SHAVE OF SKIN TAG RIGHT BUTTOCK;  Surgeon: Aviva Signs, MD;  Location: [facility];  Service: General;  Laterality: Right;  . JOINT REPLACEMENT     left knee  . MASS EXCISION Left 06/17/2016   Procedure: EXCISION SKIN MALIGNANT LESION LEFT BUTTOCK;  Surgeon: Aviva Signs, MD;  Location: [facility];  Service: General;  Laterality: Left;  . TOTAL KNEE ARTHROPLASTY  12/16/2011   Procedure: TOTAL KNEE ARTHROPLASTY;  Surgeon: Gearlean Alf, MD;  Location: [facility];  Service: Orthopedics;  Laterality: Right;  . TUBAL LIGATION       OB History   None      Home Medications    Prior to Admission medications   Medication Sig Start Date End Date Taking? Authorizing Provider  aspirin 325 MG tablet Take 162.5 mg by mouth daily.    Yes [provider]  b complex vitamins tablet Take 1 tablet by mouth daily.   Yes [provider]  cetirizine (ZYRTEC) 10 MG tablet Take 10 mg by mouth daily.   Yes [provider]  cholecalciferol (VITAMIN D) 1000 UNITS tablet Take 1,000 Units by mouth daily.   Yes [provider]  Cyanocobalamin (VITAMIN B-12 PO) Take 1 tablet by mouth daily.    Yes [provider]  losartan (COZAAR) 100 MG  tablet Take 100 mg by mouth daily.    Yes [provider]  MELATONIN PO Take 1 tablet by mouth at bedtime.   Yes [provider]  Omega-3 Fatty Acids (FISH OIL PO) Take 2 capsules by mouth daily.    Yes [provider]  terbinafine (LAMISIL) 250 MG tablet Take 250 mg by mouth daily. 12/19/17  Yes [provider]  vitamin E 400 UNIT capsule Take 800 Units by mouth daily.   Yes [provider]  nortriptyline (PAMELOR) 10 MG capsule Take 3 capsules (30 mg total) by mouth at bedtime. Patient not taking: Reported on 01/21/2018 11/24/17   Marcial Pacas, MD    Family History Family History  Problem Relation Age of Onset  . Heart  attack Mother   . Bronchitis Father     Social History Social History   Tobacco Use  . Smoking status: Never Smoker  . Smokeless tobacco: Never Used  Substance Use Topics  . Alcohol use: No    Frequency: Never  . Drug use: No     Allergies   Penicillins   Review of Systems Review of Systems  Constitutional: Positive for diaphoresis. Negative for appetite change.  HENT: Negative for drooling.   Respiratory: Negative for shortness of breath.   Cardiovascular: Negative for chest pain.  Gastrointestinal: Negative for abdominal distention.  Genitourinary: Negative for flank pain.  Skin: Negative for rash.  Neurological: Positive for light-headedness.  Psychiatric/Behavioral: Negative for confusion.     Physical Exam Updated Vital Signs BP (!) 157/89 (BP Location: Right Arm)   Pulse 89   Temp (!) 97.5 F (36.4 C) (Oral)   Resp 20   Ht 5\' 6"  (1.676 m)   Wt 78 kg (172 lb)   SpO2 98%   BMI 27.76 kg/m   Physical Exam  Constitutional: She appears well-developed.  HENT:  Head: Atraumatic.  Eyes: EOM are normal.  Neck: Neck supple.  Cardiovascular: Normal rate.  Pulmonary/Chest: Effort normal.  Abdominal: Soft.  Musculoskeletal: She exhibits no edema.  Neurological: She is alert.  Skin: Skin is warm. Capillary refill takes less than 2 seconds.  Psychiatric: She has a normal mood and affect.     ED Treatments / Results  Labs (all labs ordered are listed, but only abnormal results are displayed) Labs Reviewed  COMPREHENSIVE METABOLIC PANEL - Abnormal; Notable for the following components:      Result Value   Sodium 131 (*)    Chloride 99 (*)    Glucose, Bld 138 (*)    BUN 27 (*)    Creatinine, Ser 1.04 (*)    Albumin 3.2 (*)    GFR calc non Af Amer 48 (*)    GFR calc Af Amer 55 (*)    All other components within normal limits  CBC WITH DIFFERENTIAL/PLATELET - Abnormal; Notable for the following components:   WBC 13.1 (*)    RBC 3.66 (*)    Hemoglobin  10.9 (*)    HCT 32.7 (*)    Neutro Abs 11.1 (*)    All other components within normal limits  URINALYSIS, ROUTINE W REFLEX MICROSCOPIC - Abnormal; Notable for the following components:   APPearance CLOUDY (*)    Protein, ur 100 (*)    Nitrite POSITIVE (*)    Leukocytes, UA LARGE (*)    Bacteria, UA MANY (*)    Squamous Epithelial / LPF 6-30 (*)    All other components within normal limits  TROPONIN I  EKG EKG Interpretation  Date/Time:  Wednesday January 21 2018 16:13:41 EDT Ventricular Rate:  59 PR Interval:    QRS Duration: 92 QT Interval:  495 QTC Calculation: 491 R Axis:   20 Text Interpretation:  Sinus rhythm Low voltage, precordial leads Borderline prolonged QT interval Confirmed by Davonna Belling [phone]) on 01/21/2018 4:17:57 PM   Radiology No results found.  Procedures Procedures (including critical care time)  Medications Ordered in ED Medications - No data to display   Initial Impression / Assessment and Plan / ED Course  I have reviewed the triage vital signs and the nursing notes.  Pertinent labs & imaging results that were available during my care of the patient were reviewed by me and considered in my medical decision making (see chart for details).     Patient with near syncopal episode.  Hypotensive for EMS for resolved now.  No arrhythmia for EMS.  Tracings reviewed.  Feels much better.  Labs reassuring.  Tolerated ambulation here.  Will discharge to follow with PCP.  Final Clinical Impressions(s) / ED Diagnoses   Final diagnoses:  Near syncope    ED Discharge Orders    None       Davonna Belling, MD 01/22/18 0045

## 2018-01-21 NOTE — ED Notes (Signed)
EDP aware of patient ambulating.

## 2018-01-21 NOTE — ED Triage Notes (Signed)
Pt had a near syncopal episode at home. Was bending over fridge and felt as if she was going to pass out. Sat down for a while and did not realize how long she had been down. BP after standing was 70/40. Pt diaphoretic. Alert and oriented. Weakness stated by pt. CBG in 140s

## 2018-01-21 NOTE — ED Notes (Signed)
Pt and family updated on plan of care, pt waiting on urine results.

## 2018-01-28 DIAGNOSIS — I10 Essential (primary) hypertension: Secondary | ICD-10-CM | POA: Diagnosis not present

## 2018-01-28 DIAGNOSIS — L539 Erythematous condition, unspecified: Secondary | ICD-10-CM | POA: Diagnosis not present

## 2018-01-28 DIAGNOSIS — R7301 Impaired fasting glucose: Secondary | ICD-10-CM | POA: Diagnosis not present

## 2018-01-28 DIAGNOSIS — R944 Abnormal results of kidney function studies: Secondary | ICD-10-CM | POA: Diagnosis not present

## 2018-01-28 DIAGNOSIS — E1121 Type 2 diabetes mellitus with diabetic nephropathy: Secondary | ICD-10-CM | POA: Diagnosis not present

## 2018-01-28 DIAGNOSIS — E559 Vitamin D deficiency, unspecified: Secondary | ICD-10-CM | POA: Diagnosis not present

## 2018-01-28 DIAGNOSIS — N182 Chronic kidney disease, stage 2 (mild): Secondary | ICD-10-CM | POA: Diagnosis not present

## 2018-01-30 ENCOUNTER — Other Ambulatory Visit: Payer: Self-pay

## 2018-01-30 DIAGNOSIS — M79605 Pain in left leg: Secondary | ICD-10-CM | POA: Diagnosis not present

## 2018-01-30 DIAGNOSIS — Z6828 Body mass index (BMI) 28.0-28.9, adult: Secondary | ICD-10-CM | POA: Diagnosis not present

## 2018-01-30 DIAGNOSIS — N183 Chronic kidney disease, stage 3 (moderate): Secondary | ICD-10-CM | POA: Diagnosis not present

## 2018-01-30 DIAGNOSIS — E559 Vitamin D deficiency, unspecified: Secondary | ICD-10-CM | POA: Diagnosis not present

## 2018-01-30 DIAGNOSIS — R7301 Impaired fasting glucose: Secondary | ICD-10-CM | POA: Diagnosis not present

## 2018-01-30 DIAGNOSIS — M79604 Pain in right leg: Secondary | ICD-10-CM | POA: Diagnosis not present

## 2018-01-30 DIAGNOSIS — E782 Mixed hyperlipidemia: Secondary | ICD-10-CM | POA: Diagnosis not present

## 2018-01-30 DIAGNOSIS — I129 Hypertensive chronic kidney disease with stage 1 through stage 4 chronic kidney disease, or unspecified chronic kidney disease: Secondary | ICD-10-CM | POA: Diagnosis not present

## 2018-02-16 DIAGNOSIS — I872 Venous insufficiency (chronic) (peripheral): Secondary | ICD-10-CM | POA: Diagnosis not present

## 2018-02-16 DIAGNOSIS — B351 Tinea unguium: Secondary | ICD-10-CM | POA: Diagnosis not present

## 2018-02-16 DIAGNOSIS — I8312 Varicose veins of left lower extremity with inflammation: Secondary | ICD-10-CM | POA: Diagnosis not present

## 2018-02-16 DIAGNOSIS — Z85828 Personal history of other malignant neoplasm of skin: Secondary | ICD-10-CM | POA: Diagnosis not present

## 2018-02-16 DIAGNOSIS — I8311 Varicose veins of right lower extremity with inflammation: Secondary | ICD-10-CM | POA: Diagnosis not present

## 2018-04-01 ENCOUNTER — Encounter: Payer: Self-pay | Admitting: Vascular Surgery

## 2018-04-01 ENCOUNTER — Ambulatory Visit (HOSPITAL_COMMUNITY)
Admission: RE | Admit: 2018-04-01 | Discharge: 2018-04-01 | Disposition: A | Discharge status: Home / Self Care | Payer: Medicare HMO | Source: Ambulatory Visit | Attending: Vascular Surgery | Admitting: Vascular Surgery

## 2018-04-01 ENCOUNTER — Ambulatory Visit: Payer: Medicare HMO | Admitting: Vascular Surgery

## 2018-04-01 VITALS — BP 158/90 | HR 90 | Temp 98.5°F | Resp 20 | Ht 66.0 in | Wt 168.0 lb

## 2018-04-01 DIAGNOSIS — E785 Hyperlipidemia, unspecified: Secondary | ICD-10-CM | POA: Insufficient documentation

## 2018-04-01 DIAGNOSIS — M79604 Pain in right leg: Secondary | ICD-10-CM | POA: Diagnosis not present

## 2018-04-01 DIAGNOSIS — M79605 Pain in left leg: Secondary | ICD-10-CM | POA: Diagnosis not present

## 2018-04-01 DIAGNOSIS — I872 Venous insufficiency (chronic) (peripheral): Secondary | ICD-10-CM | POA: Diagnosis not present

## 2018-04-01 NOTE — Progress Notes (Signed)
Patient name: [name] MRN: [id number] DOB: [birth year] Sex: female   REASON FOR CONSULT:    Bilateral leg pain.  The consult is requested by Dr. Allyn Kenner. HPI:   [name] is a pleasant 84 y.o. female, who presents with bilateral leg pain.  She states that she noted the gradual onset of pain about a year ago in both legs.  She does not describe claudication or rest pain.  She describes some aching pain in her feet with standing.  In September 2018 she developed a rash on her feet with minimal swelling.  At that time she had mild swelling around her feet and ankles.  She was seen by the dermatologist and felt to potentially have a fungal infection which was treated.  She is also been seen by neurology and undergone an extensive work-up and was told that they did not think she had neuropathy or any significant nerve issues.  She denies any previous history of DVT or phlebitis.  She does describe some aching pain in her legs with standing.  She is undergone previous bilateral total knee replacements.  Past Medical History:  Diagnosis Date  . GERD (gastroesophageal reflux disease)    occasional   . Hyperlipidemia   . Hypertension    clearance with note Dr Nevada Crane on chart  . Pneumonia    2 years ago/ states occ cough with sinus drainage- no fever  . Thyroid nodule    with biopsy- states following medically    Family History  Problem Relation Age of Onset  . Heart attack Mother   . Bronchitis Father     SOCIAL HISTORY: Social History   Socioeconomic History  . Marital status: Married    Spouse name: Not on file  . Number of children: 2  . Years of education: some business school after HS  . Highest education level: Not on file  Occupational History  . Not on file  Social Needs  . Financial resource strain: Not on file  . Food insecurity:    Worry: Not on file    Inability: Not on file  . Transportation needs:    Medical: Not on file    Non-medical: Not on file    Tobacco Use  . Smoking status: Never Smoker  . Smokeless tobacco: Never Used  Substance and Sexual Activity  . Alcohol use: No    Frequency: Never  . Drug use: No  . Sexual activity: Yes    Birth control/protection: None  Lifestyle  . Physical activity:    Days per week: Not on file    Minutes per session: Not on file  . Stress: Not on file  Relationships  . Social connections:    Talks on phone: Not on file    Gets together: Not on file    Attends religious service: Not on file    Active member of club or organization: Not on file    Attends meetings of clubs or organizations: Not on file    Relationship status: Not on file  . Intimate partner violence:    Fear of current or ex partner: Not on file    Emotionally abused: Not on file    Physically abused: Not on file    Forced sexual activity: Not on file  Other Topics Concern  . Not on file  Social History Narrative   Lives at home with her husband.   4 cups caffeine per day.   Right-handed.  Allergies  Allergen Reactions  . Penicillins Rash    Has patient had a PCN reaction causing immediate rash, facial/tongue/throat swelling, SOB or lightheadedness with hypotension: No Has patient had a PCN reaction causing severe rash involving mucus membranes or skin necrosis: No Has patient had a PCN reaction that required hospitalization: No Has patient had a PCN reaction occurring within the last 10 years: No If all of the above answers are "NO", then may proceed with Cephalosporin use.     Current Outpatient Medications  Medication Sig Dispense Refill  . aspirin 325 MG tablet Take 162.5 mg by mouth daily.     Marland Kitchen b complex vitamins tablet Take 1 tablet by mouth daily.    . cetirizine (ZYRTEC) 10 MG tablet Take 10 mg by mouth daily.    . cholecalciferol (VITAMIN D) 1000 UNITS tablet Take 1,000 Units by mouth daily.    Marland Kitchen losartan (COZAAR) 100 MG tablet Take 100 mg by mouth daily.     Marland Kitchen MELATONIN PO Take 1 tablet by mouth  at bedtime.    . Omega-3 Fatty Acids (FISH OIL PO) Take 2 capsules by mouth daily.     . vitamin E 400 UNIT capsule Take 800 Units by mouth daily.     No current facility-administered medications for this visit.     REVIEW OF SYSTEMS:  [X]  denotes positive finding, [ ]  denotes negative finding Cardiac  Comments:  Chest pain or chest pressure:    Shortness of breath upon exertion:    Short of breath when lying flat:    Irregular heart rhythm:        Vascular    Pain in calf, thigh, or hip brought on by ambulation:    Pain in feet at night that wakes you up from your sleep:  x   Blood clot in your veins:    Leg swelling:         Pulmonary    Oxygen at home:    Productive cough:     Wheezing:         Neurologic    Sudden weakness in arms or legs:     Sudden numbness in arms or legs:     Sudden onset of difficulty speaking or slurred speech:    Temporary loss of vision in one eye:     Problems with dizziness:         Gastrointestinal    Blood in stool:     Vomited blood:         Genitourinary    Burning when urinating:     Blood in urine:        Psychiatric    Major depression:         Hematologic    Bleeding problems:    Problems with blood clotting too easily:        Skin    Rashes or ulcers:        Constitutional    Fever or chills:     PHYSICAL EXAM:   Vitals:   04/01/18 1110  BP: (!) 158/90  Pulse: 90  Resp: 20  Temp: 98.5 F (36.9 C)  TempSrc: Oral  SpO2: 100%  Weight: 168 lb (76.2 kg)  Height: 5\' 6"  (1.676 m)    GENERAL: The patient is a well-nourished female, in no acute distress. The vital signs are documented above. CARDIAC: There is a regular rate and rhythm.  VASCULAR: I do not detect carotid bruits. She has palpable femoral, popliteal,  dorsalis pedis pulses bilaterally. She has mild bilateral lower extremity swelling. She has spider veins and some reticular veins bilaterally.  She also has some mild hyperpigmentation consistent with  chronic venous insufficiency. PULMONARY: There is good air exchange bilaterally without wheezing or rales. ABDOMEN: Soft and non-tender with normal pitched bowel sounds.  MUSCULOSKELETAL: There are no major deformities or cyanosis. NEUROLOGIC: No focal weakness or paresthesias are detected. SKIN: There are no ulcers or rashes noted. PSYCHIATRIC: The patient has a normal affect.  DATA:    ARTERIAL DOPPLER STUDY: I have independently interpreted her arterial Doppler study.  On the right side she has a triphasic dorsalis pedis and posterior tibial signal.  ABIs 100%.  Toe pressure is 105 mmHg.  On the left side she has a triphasic dorsalis pedis and posterior tibial signal.  ABIs 100%.  Toe pressure is 133 mmHg.  MEDICAL ISSUES:   BILATERAL LEG PAIN: I reassured her that she has no evidence of arterial insufficiency.  She has normal Doppler studies and palpable pulses.  Nor do her symptoms fit with arterial insufficiency.  I explained that venous disease can cause burning pain and paresthesias as she describes and potentially her symptoms could be related to chronic venous insufficiency.  She does have some hyperpigmentation and spider veins on exam to suggest underlying venous insufficiency.  We have discussed the importance of intermittent leg elevation the proper positioning for this.  I have written her a prescription for a knee-high compression stocking with a mild gradient.  I have encouraged her to avoid prolonged sitting and standing.  I have encouraged her to walk and exercise as much as possible.  If these conservative measures help with her symptoms that I would attribute her symptoms to venous disease.  If not then she may need continued work-up.  Apparently neurology does not feel that she has neuropathy.  I will be happy to see her back at any time if her symptoms progress.  We could do a formal venous work-up including  a venous duplex scan to look for reflux.  Deitra Mayo [facility] [phone]

## 2018-04-03 ENCOUNTER — Encounter: Payer: Self-pay | Admitting: Internal Medicine

## 2018-04-20 DIAGNOSIS — S31829A Unspecified open wound of left buttock, initial encounter: Secondary | ICD-10-CM | POA: Diagnosis not present

## 2018-04-20 DIAGNOSIS — J060 Acute laryngopharyngitis: Secondary | ICD-10-CM | POA: Diagnosis not present

## 2018-04-20 DIAGNOSIS — E1121 Type 2 diabetes mellitus with diabetic nephropathy: Secondary | ICD-10-CM | POA: Diagnosis not present

## 2018-04-20 DIAGNOSIS — R2243 Localized swelling, mass and lump, lower limb, bilateral: Secondary | ICD-10-CM | POA: Diagnosis not present

## 2018-04-20 DIAGNOSIS — R2240 Localized swelling, mass and lump, unspecified lower limb: Secondary | ICD-10-CM | POA: Diagnosis not present

## 2018-04-20 DIAGNOSIS — N182 Chronic kidney disease, stage 2 (mild): Secondary | ICD-10-CM | POA: Diagnosis not present

## 2018-04-20 DIAGNOSIS — L539 Erythematous condition, unspecified: Secondary | ICD-10-CM | POA: Diagnosis not present

## 2018-04-20 DIAGNOSIS — E559 Vitamin D deficiency, unspecified: Secondary | ICD-10-CM | POA: Diagnosis not present

## 2018-04-20 DIAGNOSIS — R7301 Impaired fasting glucose: Secondary | ICD-10-CM | POA: Diagnosis not present

## 2018-04-20 DIAGNOSIS — Z6828 Body mass index (BMI) 28.0-28.9, adult: Secondary | ICD-10-CM | POA: Diagnosis not present

## 2018-04-28 DIAGNOSIS — Z6828 Body mass index (BMI) 28.0-28.9, adult: Secondary | ICD-10-CM | POA: Diagnosis not present

## 2018-04-28 DIAGNOSIS — E1121 Type 2 diabetes mellitus with diabetic nephropathy: Secondary | ICD-10-CM | POA: Diagnosis not present

## 2018-04-28 DIAGNOSIS — R2240 Localized swelling, mass and lump, unspecified lower limb: Secondary | ICD-10-CM | POA: Diagnosis not present

## 2018-04-28 DIAGNOSIS — N182 Chronic kidney disease, stage 2 (mild): Secondary | ICD-10-CM | POA: Diagnosis not present

## 2018-04-28 DIAGNOSIS — R2243 Localized swelling, mass and lump, lower limb, bilateral: Secondary | ICD-10-CM | POA: Diagnosis not present

## 2018-04-28 DIAGNOSIS — S31829A Unspecified open wound of left buttock, initial encounter: Secondary | ICD-10-CM | POA: Diagnosis not present

## 2018-04-28 DIAGNOSIS — J060 Acute laryngopharyngitis: Secondary | ICD-10-CM | POA: Diagnosis not present

## 2018-04-28 DIAGNOSIS — L539 Erythematous condition, unspecified: Secondary | ICD-10-CM | POA: Diagnosis not present

## 2018-04-28 DIAGNOSIS — E559 Vitamin D deficiency, unspecified: Secondary | ICD-10-CM | POA: Diagnosis not present

## 2018-04-28 DIAGNOSIS — R7301 Impaired fasting glucose: Secondary | ICD-10-CM | POA: Diagnosis not present

## 2018-05-27 ENCOUNTER — Other Ambulatory Visit: Payer: Self-pay | Admitting: Internal Medicine

## 2018-05-27 ENCOUNTER — Ambulatory Visit (HOSPITAL_COMMUNITY)
Admission: RE | Admit: 2018-05-27 | Discharge: 2018-05-27 | Disposition: A | Discharge status: Home / Self Care | Payer: Medicare HMO | Source: Ambulatory Visit | Attending: Internal Medicine | Admitting: Internal Medicine

## 2018-05-27 DIAGNOSIS — K219 Gastro-esophageal reflux disease without esophagitis: Secondary | ICD-10-CM | POA: Diagnosis not present

## 2018-05-27 DIAGNOSIS — R062 Wheezing: Secondary | ICD-10-CM | POA: Diagnosis not present

## 2018-05-27 DIAGNOSIS — R059 Cough, unspecified: Secondary | ICD-10-CM

## 2018-05-27 DIAGNOSIS — R05 Cough: Secondary | ICD-10-CM

## 2018-05-27 DIAGNOSIS — Z6829 Body mass index (BMI) 29.0-29.9, adult: Secondary | ICD-10-CM | POA: Diagnosis not present

## 2018-06-10 DIAGNOSIS — R1030 Lower abdominal pain, unspecified: Secondary | ICD-10-CM | POA: Diagnosis not present

## 2018-06-10 DIAGNOSIS — R05 Cough: Secondary | ICD-10-CM | POA: Diagnosis not present

## 2018-06-10 DIAGNOSIS — K219 Gastro-esophageal reflux disease without esophagitis: Secondary | ICD-10-CM | POA: Diagnosis not present

## 2018-06-10 DIAGNOSIS — R197 Diarrhea, unspecified: Secondary | ICD-10-CM | POA: Diagnosis not present

## 2018-06-10 DIAGNOSIS — H9203 Otalgia, bilateral: Secondary | ICD-10-CM | POA: Diagnosis not present

## 2018-06-10 DIAGNOSIS — Z6829 Body mass index (BMI) 29.0-29.9, adult: Secondary | ICD-10-CM | POA: Diagnosis not present

## 2018-06-26 DIAGNOSIS — I129 Hypertensive chronic kidney disease with stage 1 through stage 4 chronic kidney disease, or unspecified chronic kidney disease: Secondary | ICD-10-CM | POA: Diagnosis not present

## 2018-06-26 DIAGNOSIS — E1121 Type 2 diabetes mellitus with diabetic nephropathy: Secondary | ICD-10-CM | POA: Diagnosis not present

## 2018-06-26 DIAGNOSIS — N182 Chronic kidney disease, stage 2 (mild): Secondary | ICD-10-CM | POA: Diagnosis not present

## 2018-06-26 DIAGNOSIS — E782 Mixed hyperlipidemia: Secondary | ICD-10-CM | POA: Diagnosis not present

## 2018-06-26 DIAGNOSIS — E7801 Familial hypercholesterolemia: Secondary | ICD-10-CM | POA: Diagnosis not present

## 2018-06-26 DIAGNOSIS — R944 Abnormal results of kidney function studies: Secondary | ICD-10-CM | POA: Diagnosis not present

## 2018-06-26 DIAGNOSIS — R7301 Impaired fasting glucose: Secondary | ICD-10-CM | POA: Diagnosis not present

## 2018-06-26 DIAGNOSIS — I10 Essential (primary) hypertension: Secondary | ICD-10-CM | POA: Diagnosis not present

## 2018-06-26 DIAGNOSIS — N183 Chronic kidney disease, stage 3 (moderate): Secondary | ICD-10-CM | POA: Diagnosis not present

## 2018-06-26 DIAGNOSIS — E559 Vitamin D deficiency, unspecified: Secondary | ICD-10-CM | POA: Diagnosis not present

## 2018-06-28 DIAGNOSIS — C801 Malignant (primary) neoplasm, unspecified: Secondary | ICD-10-CM

## 2018-06-28 HISTORY — DX: Malignant (primary) neoplasm, unspecified: C80.1

## 2018-06-30 ENCOUNTER — Other Ambulatory Visit: Payer: Self-pay | Admitting: Adult Health Nurse Practitioner

## 2018-06-30 DIAGNOSIS — R05 Cough: Secondary | ICD-10-CM | POA: Diagnosis not present

## 2018-06-30 DIAGNOSIS — N6310 Unspecified lump in the right breast, unspecified quadrant: Secondary | ICD-10-CM

## 2018-06-30 DIAGNOSIS — J309 Allergic rhinitis, unspecified: Secondary | ICD-10-CM | POA: Diagnosis not present

## 2018-06-30 DIAGNOSIS — Z6830 Body mass index (BMI) 30.0-30.9, adult: Secondary | ICD-10-CM | POA: Diagnosis not present

## 2018-06-30 DIAGNOSIS — K219 Gastro-esophageal reflux disease without esophagitis: Secondary | ICD-10-CM | POA: Diagnosis not present

## 2018-06-30 DIAGNOSIS — E1121 Type 2 diabetes mellitus with diabetic nephropathy: Secondary | ICD-10-CM | POA: Diagnosis not present

## 2018-06-30 DIAGNOSIS — N63 Unspecified lump in unspecified breast: Secondary | ICD-10-CM

## 2018-06-30 DIAGNOSIS — I10 Essential (primary) hypertension: Secondary | ICD-10-CM | POA: Diagnosis not present

## 2018-06-30 DIAGNOSIS — R2231 Localized swelling, mass and lump, right upper limb: Secondary | ICD-10-CM | POA: Diagnosis not present

## 2018-06-30 DIAGNOSIS — N6459 Other signs and symptoms in breast: Secondary | ICD-10-CM | POA: Diagnosis not present

## 2018-06-30 DIAGNOSIS — E782 Mixed hyperlipidemia: Secondary | ICD-10-CM | POA: Diagnosis not present

## 2018-06-30 DIAGNOSIS — L539 Erythematous condition, unspecified: Secondary | ICD-10-CM | POA: Diagnosis not present

## 2018-06-30 DIAGNOSIS — N644 Mastodynia: Secondary | ICD-10-CM

## 2018-07-02 ENCOUNTER — Other Ambulatory Visit: Payer: Self-pay | Admitting: Adult Health Nurse Practitioner

## 2018-07-02 ENCOUNTER — Ambulatory Visit
Admission: RE | Admit: 2018-07-02 | Discharge: 2018-07-02 | Disposition: A | Discharge status: Home / Self Care | Payer: Medicare HMO | Source: Ambulatory Visit | Attending: Adult Health Nurse Practitioner | Admitting: Adult Health Nurse Practitioner

## 2018-07-02 DIAGNOSIS — N644 Mastodynia: Secondary | ICD-10-CM

## 2018-07-02 DIAGNOSIS — R599 Enlarged lymph nodes, unspecified: Secondary | ICD-10-CM

## 2018-07-02 DIAGNOSIS — R928 Other abnormal and inconclusive findings on diagnostic imaging of breast: Secondary | ICD-10-CM | POA: Diagnosis not present

## 2018-07-02 DIAGNOSIS — N63 Unspecified lump in unspecified breast: Secondary | ICD-10-CM

## 2018-07-02 DIAGNOSIS — N6310 Unspecified lump in the right breast, unspecified quadrant: Secondary | ICD-10-CM

## 2018-07-02 DIAGNOSIS — N6320 Unspecified lump in the left breast, unspecified quadrant: Secondary | ICD-10-CM

## 2018-07-03 ENCOUNTER — Ambulatory Visit: Payer: Medicare HMO

## 2018-07-03 ENCOUNTER — Ambulatory Visit
Admission: RE | Admit: 2018-07-03 | Discharge: 2018-07-03 | Disposition: A | Discharge status: Home / Self Care | Payer: Medicare HMO | Source: Ambulatory Visit | Attending: Adult Health Nurse Practitioner | Admitting: Adult Health Nurse Practitioner

## 2018-07-03 ENCOUNTER — Other Ambulatory Visit: Payer: Self-pay | Admitting: Adult Health Nurse Practitioner

## 2018-07-03 DIAGNOSIS — R599 Enlarged lymph nodes, unspecified: Secondary | ICD-10-CM

## 2018-07-03 DIAGNOSIS — N6310 Unspecified lump in the right breast, unspecified quadrant: Secondary | ICD-10-CM

## 2018-07-03 DIAGNOSIS — N6320 Unspecified lump in the left breast, unspecified quadrant: Secondary | ICD-10-CM

## 2018-07-03 DIAGNOSIS — R590 Localized enlarged lymph nodes: Secondary | ICD-10-CM | POA: Diagnosis not present

## 2018-07-03 DIAGNOSIS — N6489 Other specified disorders of breast: Secondary | ICD-10-CM | POA: Diagnosis not present

## 2018-07-03 DIAGNOSIS — N6311 Unspecified lump in the right breast, upper outer quadrant: Secondary | ICD-10-CM | POA: Diagnosis not present

## 2018-07-03 DIAGNOSIS — C50411 Malignant neoplasm of upper-outer quadrant of right female breast: Secondary | ICD-10-CM | POA: Diagnosis not present

## 2018-07-03 DIAGNOSIS — N6321 Unspecified lump in the left breast, upper outer quadrant: Secondary | ICD-10-CM | POA: Diagnosis not present

## 2018-07-10 DIAGNOSIS — C50911 Malignant neoplasm of unspecified site of right female breast: Secondary | ICD-10-CM | POA: Diagnosis not present

## 2018-07-15 ENCOUNTER — Encounter: Payer: Self-pay | Admitting: *Deleted

## 2018-07-15 ENCOUNTER — Other Ambulatory Visit (HOSPITAL_COMMUNITY): Payer: Self-pay | Admitting: General Surgery

## 2018-07-15 ENCOUNTER — Telehealth: Payer: Self-pay | Admitting: Hematology and Oncology

## 2018-07-15 ENCOUNTER — Other Ambulatory Visit: Payer: Self-pay | Admitting: General Surgery

## 2018-07-15 ENCOUNTER — Inpatient Hospital Stay: Payer: Medicare HMO | Attending: Hematology and Oncology | Admitting: Hematology and Oncology

## 2018-07-15 DIAGNOSIS — E785 Hyperlipidemia, unspecified: Secondary | ICD-10-CM | POA: Diagnosis not present

## 2018-07-15 DIAGNOSIS — C50011 Malignant neoplasm of nipple and areola, right female breast: Secondary | ICD-10-CM

## 2018-07-15 DIAGNOSIS — E041 Nontoxic single thyroid nodule: Secondary | ICD-10-CM | POA: Diagnosis not present

## 2018-07-15 DIAGNOSIS — Z171 Estrogen receptor negative status [ER-]: Secondary | ICD-10-CM | POA: Insufficient documentation

## 2018-07-15 DIAGNOSIS — C50411 Malignant neoplasm of upper-outer quadrant of right female breast: Secondary | ICD-10-CM | POA: Diagnosis not present

## 2018-07-15 DIAGNOSIS — I10 Essential (primary) hypertension: Secondary | ICD-10-CM | POA: Insufficient documentation

## 2018-07-15 DIAGNOSIS — Z8701 Personal history of pneumonia (recurrent): Secondary | ICD-10-CM | POA: Diagnosis not present

## 2018-07-15 DIAGNOSIS — Z7982 Long term (current) use of aspirin: Secondary | ICD-10-CM

## 2018-07-15 DIAGNOSIS — K219 Gastro-esophageal reflux disease without esophagitis: Secondary | ICD-10-CM | POA: Diagnosis not present

## 2018-07-15 DIAGNOSIS — Z79899 Other long term (current) drug therapy: Secondary | ICD-10-CM | POA: Insufficient documentation

## 2018-07-15 DIAGNOSIS — C50111 Malignant neoplasm of central portion of right female breast: Secondary | ICD-10-CM

## 2018-07-15 NOTE — Assessment & Plan Note (Signed)
Palpable right breast mass for several months with skin discoloration, clinically skin trabecular thickening with nipple retraction measuring 7.2 x 4.3 x 4.2 cm, additional mass 4 o'clock position 2.6 cm, right axillary tail 1.2 cm right axillary lymph node 1.8 cm left breast irregular mass 1 cm, adjacent mass 0.9 cm together measuring 1.9 cm, no lymphadenopathy  07/03/2018: Right breast biopsy: IDC grade 2, ER 0%, PR 0%, HER-2 positive, Ki-67 40%, lymph node biopsy negative, left breast biopsy complex sclerosing lesion  Pathology and radiology counseling: Discussed with the patient, the details of pathology including the type of breast cancer,the clinical staging, the significance of ER, PR and HER-2/neu receptors and the implications for treatment. After reviewing the pathology in detail, we proceeded to discuss the different treatment options between surgery, radiation, chemotherapy, antiestrogen therapies.  Recommendation: 1.  I discussed with her 2 options these included neoadjuvant anti-HER-2 therapy for 6 months with Herceptin followed by surgery followed by radiation and continuation of Herceptin maintenance for 1 year. 2. the other option is to undergo mastectomy followed by 1 year of adjuvant Herceptin.  Followed by radiation 3.  Awaiting the results of the CT scans that were ordered for staging.  If there is no metastatic disease then Dr. Donne Hazel and I will have to come up with the final plan.  Return to clinic based upon the results of the scans.

## 2018-07-15 NOTE — Progress Notes (Signed)
[facility] CONSULT NOTE  Patient Care Team: [name], MD as PCP - General (Internal Medicine) [name], MD as Consulting Physician (Dermatology)  CHIEF COMPLAINTS/PURPOSE OF CONSULTATION:  Newly diagnosed breast cancer  HISTORY OF PRESENTING ILLNESS:  [name] 84 y.o. female is here because of recent diagnosis of right breast cancer.  Patient felt a lump in the right breast for several months with skin discoloration and reddening.  She did not get worked up immediately.  Recently she had a mammogram and ultrasound and it revealed skin thickening or nipple retraction and a 7.2 cm mass with additional masses 2.6 cm and the right axillary tail 1.2 cm lymph node.  On the left side she also felt some masses which on biopsy were benign.  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  She was seen by Dr. Donne Hazel and was referred to [place] for further evaluation.  I reviewed her records extensively and collaborated the history with the patient.  SUMMARY OF ONCOLOGIC HISTORY:   Malignant neoplasm of upper-outer quadrant of right breast in female, estrogen receptor negative ([place])   07/03/2018 Initial Diagnosis    Palpable right breast mass for several months with skin discoloration, clinically skin trabecular thickening with nipple retraction measuring 7.2 x 4.3 x 4.2 cm, additional mass 4 o'clock position 2.6 cm, right axillary tail 1.2 cm right axillary lymph node 1.8 cm left breast irregular mass 1 cm, adjacent mass 0.9 cm together measuring 1.9 cm, no lymphadenopathy    07/03/2018 Pathology Results    Right breast biopsy: IDC grade 2, ER 0%, PR 0%, HER-2 positive, Ki-67 40%, lymph node biopsy negative, left breast biopsy complex sclerosing lesion     07/15/2018 Cancer Staging    Staging form: Breast, AJCC 8th Edition - Clinical: Stage IIB (cT3, cN0, cM0, G2, ER-, PR-, HER2+) -  Signed by [name], MD on 07/15/2018    MEDICAL HISTORY:  Past Medical History:  Diagnosis Date  . GERD (gastroesophageal reflux disease)    occasional   . Hyperlipidemia   . Hypertension    clearance with note Dr [place] Crane on chart  . Pneumonia    2 years ago/ states occ cough with sinus drainage- no fever  . Thyroid nodule    with biopsy- states following medically    SURGICAL HISTORY: Past Surgical History:  Procedure Laterality Date  . CATARACT EXTRACTION W/PHACO  11/30/2012   Procedure: CATARACT EXTRACTION PHACO AND INTRAOCULAR LENS PLACEMENT (IOC);  Surgeon: Williams Che, MD;  Location: [facility];  Service: Ophthalmology;  Laterality: Left;  CDE:11.49  . CATARACT EXTRACTION W/PHACO Right 03/15/2013   Procedure: CATARACT EXTRACTION PHACO AND INTRAOCULAR LENS PLACEMENT (IOC);  Surgeon: Williams Che, MD;  Location: [facility];  Service: Ophthalmology;  Laterality: Right;  CDE 16.15  . COLONOSCOPY    . EXCISION OF SKIN TAG Right 06/17/2016   Procedure: SHAVE OF SKIN TAG RIGHT BUTTOCK;  Surgeon: Aviva Signs, MD;  Location: [facility];  Service: General;  Laterality: Right;  . JOINT REPLACEMENT     left knee  . MASS EXCISION Left 06/17/2016   Procedure: EXCISION SKIN MALIGNANT LESION LEFT BUTTOCK;  Surgeon: Aviva Signs, MD;  Location: [facility];  Service: General;  Laterality: Left;  . TOTAL KNEE ARTHROPLASTY  12/16/2011   Procedure: TOTAL KNEE ARTHROPLASTY;  Surgeon: Gearlean Alf, MD;  Location: [facility];  Service: Orthopedics;  Laterality: Right;  . TUBAL LIGATION      SOCIAL HISTORY: Social History   Socioeconomic History  . Marital status: Married    Spouse name: Not on file  . Number of children: 2  . Years of education: some business school after HS  . Highest education level: Not on file  Occupational History  . Not on file  Social Needs  . Financial resource strain: Not on file  . Food insecurity:    Worry: Not on file    Inability: Not on file  . Transportation  needs:    Medical: Not on file    Non-medical: Not on file  Tobacco Use  . Smoking status: Never Smoker  . Smokeless tobacco: Never Used  Substance and Sexual Activity  . Alcohol use: No    Frequency: Never  . Drug use: No  . Sexual activity: Yes    Birth control/protection: None  Lifestyle  . Physical activity:    Days per week: Not on file    Minutes per session: Not on file  . Stress: Not on file  Relationships  . Social connections:    Talks on phone: Not on file    Gets together: Not on file    Attends religious service: Not on file    Active member of club or organization: Not on file    Attends meetings of clubs or organizations: Not on file    Relationship status: Not on file  . Intimate partner violence:    Fear of current or ex partner: Not on file    Emotionally abused: Not on file    Physically abused: Not on file    Forced sexual activity: Not on file  Other Topics Concern  . Not on file  Social History Narrative   Lives at home with her husband.   4 cups caffeine per day.   Right-handed.    FAMILY HISTORY: Family History  Problem Relation Age of Onset  . Heart attack Mother   . Bronchitis Father     ALLERGIES:  is allergic to penicillins.  MEDICATIONS:  Current Outpatient Medications  Medication Sig Dispense Refill  . aspirin 325 MG tablet Take 162.5 mg by mouth daily.     Marland Kitchen b complex vitamins tablet Take 1 tablet by mouth daily.    . cetirizine (ZYRTEC) 10 MG tablet Take 10 mg by mouth daily.    . cholecalciferol (VITAMIN D) 1000 UNITS tablet Take 1,000 Units by mouth daily.    Marland Kitchen losartan (COZAAR) 100 MG tablet Take 100 mg by mouth daily.     Marland Kitchen MELATONIN PO Take 1 tablet by mouth at bedtime.    . Omega-3 Fatty Acids (FISH OIL PO) Take 2 capsules by mouth daily.     . vitamin E 400 UNIT capsule Take 800 Units by mouth daily.     No current facility-administered medications for this visit.     REVIEW OF SYSTEMS:   Constitutional: Denies  fevers, chills or abnormal night sweats Eyes: Denies blurriness of vision, double vision or watery eyes Ears, nose, mouth, throat, and face: Denies mucositis or sore throat Respiratory: Denies cough, dyspnea or wheezes Cardiovascular: Denies palpitation, chest discomfort or lower extremity swelling Gastrointestinal:  Denies nausea, heartburn or change in bowel habits Skin: Denies abnormal skin rashes Lymphatics: Denies new lymphadenopathy or easy bruising Neurological:Denies numbness, tingling or new weaknesses Behavioral/Psych: Mood is stable, no new changes  Breast: Right breast inflammatory changes related to breast  cancer All other systems were reviewed with the patient and are negative.  PHYSICAL EXAMINATION: ECOG PERFORMANCE STATUS: 2 - Symptomatic, <50% confined to bed  Vitals:   07/15/18 1207  BP: (!) 162/79  Pulse: 93  Resp: 17  Temp: 98.8 F (37.1 C)  SpO2: 100%   Filed Weights   07/15/18 1207  Weight: 183 lb 3.2 oz (83.1 kg)    GENERAL:alert, no distress and comfortable SKIN: skin color, texture, turgor are normal, no rashes or significant lesions EYES: normal, conjunctiva are pink and non-injected, sclera clear OROPHARYNX:no exudate, no erythema and lips, buccal mucosa, and tongue normal  NECK: supple, thyroid normal size, non-tender, without nodularity LYMPH:  no palpable lymphadenopathy in the cervical, axillary or inguinal LUNGS: clear to auscultation and percussion with normal breathing effort HEART: regular rate & rhythm and no murmurs and no lower extremity edema ABDOMEN:abdomen soft, non-tender and normal bowel sounds Musculoskeletal:no cyanosis of digits and no clubbing  PSYCH: alert & oriented x 3 with fluent speech NEURO: no focal motor/sensory deficits BREAST: There is significant redness involving the upper and lower aspects of the right breast.  There is also skin thickening and nipple retraction. No palpable axillary or supraclavicular  lymphadenopathy (exam performed in the presence of a chaperone)   LABORATORY DATA:  I have reviewed the data as listed Lab Results  Component Value Date   WBC 13.1 (H) 01/21/2018   HGB 10.9 (L) 01/21/2018   HCT 32.7 (L) 01/21/2018   MCV 89.3 01/21/2018   PLT 361 01/21/2018   Lab Results  Component Value Date   NA 131 (L) 01/21/2018   K 4.6 01/21/2018   CL 99 (L) 01/21/2018   CO2 22 01/21/2018    RADIOGRAPHIC STUDIES: I have personally reviewed the radiological reports and agreed with the findings in the report.  ASSESSMENT AND PLAN:  Malignant neoplasm of upper-outer quadrant of right breast in female, estrogen receptor negative (HCC) Palpable right breast mass for several months with skin discoloration, clinically skin trabecular thickening with nipple retraction measuring 7.2 x 4.3 x 4.2 cm, additional mass 4 o'clock position 2.6 cm, right axillary tail 1.2 cm right axillary lymph node 1.8 cm left breast irregular mass 1 cm, adjacent mass 0.9 cm together measuring 1.9 cm, no lymphadenopathy  07/03/2018: Right breast biopsy: IDC grade 2, ER 0%, PR 0%, HER-2 positive, Ki-67 40%, lymph node biopsy negative, left breast biopsy complex sclerosing lesion  Pathology and radiology counseling: Discussed with the patient, the details of pathology including the type of breast cancer,the clinical staging, the significance of ER, PR and HER-2/neu receptors and the implications for treatment. After reviewing the pathology in detail, we proceeded to discuss the different treatment options between surgery, radiation, chemotherapy, antiestrogen therapies.  Recommendation: 1.  I discussed with her 2 options these included neoadjuvant anti-HER-2 therapy for 6 months with Herceptin followed by surgery followed by radiation and continuation of Herceptin maintenance for 1 year. 2. the other option is to undergo mastectomy followed by 1 year of adjuvant Herceptin.  Followed by radiation 3.  Awaiting the  results of the CT scans that were ordered for staging.  If there is no metastatic disease then Dr. Donne Hazel and I will have to come up with the final plan.  Return to clinic based upon the results of the scans.    All questions were answered. The patient knows to call the clinic with any problems, questions or concerns.    Harriette Ohara, MD 07/15/18

## 2018-07-15 NOTE — Telephone Encounter (Signed)
Per 9/18 no los °

## 2018-07-16 ENCOUNTER — Ambulatory Visit (HOSPITAL_COMMUNITY)
Admission: RE | Admit: 2018-07-16 | Discharge: 2018-07-16 | Disposition: A | Discharge status: Home / Self Care | Payer: Medicare HMO | Source: Ambulatory Visit | Attending: General Surgery | Admitting: General Surgery

## 2018-07-16 ENCOUNTER — Encounter (HOSPITAL_COMMUNITY)
Admission: RE | Admit: 2018-07-16 | Discharge: 2018-07-16 | Disposition: A | Discharge status: Home / Self Care | Payer: Medicare HMO | Source: Ambulatory Visit | Attending: General Surgery | Admitting: General Surgery

## 2018-07-16 ENCOUNTER — Encounter (HOSPITAL_COMMUNITY): Payer: Self-pay

## 2018-07-16 DIAGNOSIS — R911 Solitary pulmonary nodule: Secondary | ICD-10-CM | POA: Insufficient documentation

## 2018-07-16 DIAGNOSIS — C50911 Malignant neoplasm of unspecified site of right female breast: Secondary | ICD-10-CM | POA: Insufficient documentation

## 2018-07-16 DIAGNOSIS — N281 Cyst of kidney, acquired: Secondary | ICD-10-CM | POA: Diagnosis not present

## 2018-07-16 DIAGNOSIS — R9341 Abnormal radiologic findings on diagnostic imaging of renal pelvis, ureter, or bladder: Secondary | ICD-10-CM | POA: Diagnosis not present

## 2018-07-16 DIAGNOSIS — R948 Abnormal results of function studies of other organs and systems: Secondary | ICD-10-CM | POA: Diagnosis not present

## 2018-07-16 DIAGNOSIS — I700 Atherosclerosis of aorta: Secondary | ICD-10-CM | POA: Diagnosis not present

## 2018-07-16 DIAGNOSIS — Z96653 Presence of artificial knee joint, bilateral: Secondary | ICD-10-CM | POA: Insufficient documentation

## 2018-07-16 DIAGNOSIS — C50011 Malignant neoplasm of nipple and areola, right female breast: Secondary | ICD-10-CM

## 2018-07-16 DIAGNOSIS — C50919 Malignant neoplasm of unspecified site of unspecified female breast: Secondary | ICD-10-CM | POA: Diagnosis not present

## 2018-07-16 DIAGNOSIS — C50111 Malignant neoplasm of central portion of right female breast: Secondary | ICD-10-CM

## 2018-07-16 LAB — POCT I-STAT CREATININE: CREATININE: 1 mg/dL (ref 0.44–1.00)

## 2018-07-16 MED ORDER — TECHNETIUM TC 99M MEDRONATE IV KIT
20.0000 | PACK | Freq: Once | INTRAVENOUS | Status: AC | PRN
  Administered 2018-07-16: 20 via INTRAVENOUS

## 2018-07-16 MED ORDER — IOHEXOL 300 MG/ML  SOLN
100.0000 mL | Freq: Once | INTRAMUSCULAR | Status: AC | PRN
  Administered 2018-07-16: 100 mL via INTRAVENOUS

## 2018-07-17 ENCOUNTER — Other Ambulatory Visit: Payer: Self-pay

## 2018-07-17 ENCOUNTER — Other Ambulatory Visit: Payer: Self-pay | Admitting: Hematology and Oncology

## 2018-07-17 DIAGNOSIS — C50411 Malignant neoplasm of upper-outer quadrant of right female breast: Secondary | ICD-10-CM

## 2018-07-17 DIAGNOSIS — Z171 Estrogen receptor negative status [ER-]: Principal | ICD-10-CM

## 2018-07-17 DIAGNOSIS — Z5181 Encounter for therapeutic drug level monitoring: Secondary | ICD-10-CM

## 2018-07-17 MED ORDER — LIDOCAINE-PRILOCAINE 2.5-2.5 % EX CREA: TOPICAL_CREAM | CUTANEOUS | 3 refills | Status: DC

## 2018-07-17 NOTE — Progress Notes (Signed)
lvm to vascular lab at [facility] to schedule pt for echo. Awaiting appt.

## 2018-07-17 NOTE — Progress Notes (Signed)
I discussed the case with Dr. Donne Hazel. The scans do not show any evidence of distant metastatic disease. We decided to treat her with 6 months of Herceptin followed by mastectomy followed by 6 more months of Herceptin. We believe that she cannot tolerate chemotherapy but she would be able to tolerate Herceptin. We will need to schedule her for an echocardiogram port placement and chemo class. I called her and left a message for patient to call me back.

## 2018-07-20 ENCOUNTER — Telehealth: Payer: Self-pay | Admitting: Hematology and Oncology

## 2018-07-20 ENCOUNTER — Other Ambulatory Visit: Payer: Self-pay | Admitting: *Deleted

## 2018-07-20 ENCOUNTER — Telehealth: Payer: Self-pay | Admitting: *Deleted

## 2018-07-20 ENCOUNTER — Other Ambulatory Visit: Payer: Self-pay | Admitting: Hematology and Oncology

## 2018-07-20 DIAGNOSIS — C50411 Malignant neoplasm of upper-outer quadrant of right female breast: Secondary | ICD-10-CM

## 2018-07-20 DIAGNOSIS — Z171 Estrogen receptor negative status [ER-]: Principal | ICD-10-CM

## 2018-07-20 NOTE — Telephone Encounter (Signed)
Called pt with scan results. Discussed plan of care. Informed pt she will have 6 months of Herceptin followed by surgery then 6 more months of herceptin as discussed by Drs. Rande Lawman. Received verbal understanding from pt and husband.  Pt wishes to have herceptin treatments at [facility]. Appointments and [facility] and [facility] cancelled. Referral placed. Spoke to nurse navigator regarding pt and plan of care.

## 2018-07-20 NOTE — Telephone Encounter (Signed)
Scheduled appt per 9/20 sch message - left message for patient with appt date and time and sent reminder letter in the mail with appt date and time.

## 2018-07-22 ENCOUNTER — Other Ambulatory Visit: Payer: Self-pay

## 2018-07-22 ENCOUNTER — Encounter (HOSPITAL_BASED_OUTPATIENT_CLINIC_OR_DEPARTMENT_OTHER): Payer: Self-pay | Admitting: *Deleted

## 2018-07-23 ENCOUNTER — Other Ambulatory Visit: Payer: Medicare HMO

## 2018-07-23 ENCOUNTER — Other Ambulatory Visit (HOSPITAL_COMMUNITY): Payer: Medicare HMO

## 2018-07-23 NOTE — Progress Notes (Addendum)
Patient given ensure pre-surgical drink and instructed to drink by 5:30am DOS. Pt verbalized understanding.

## 2018-07-27 ENCOUNTER — Other Ambulatory Visit: Payer: Self-pay | Admitting: General Surgery

## 2018-07-27 ENCOUNTER — Ambulatory Visit: Payer: Medicare HMO | Admitting: Hematology and Oncology

## 2018-07-27 ENCOUNTER — Inpatient Hospital Stay (HOSPITAL_COMMUNITY): Payer: Medicare HMO | Attending: Hematology | Admitting: Hematology

## 2018-07-27 ENCOUNTER — Other Ambulatory Visit: Payer: Medicare HMO

## 2018-07-27 ENCOUNTER — Ambulatory Visit (HOSPITAL_COMMUNITY)
Admission: RE | Admit: 2018-07-27 | Discharge: 2018-07-27 | Disposition: A | Discharge status: Home / Self Care | Payer: Medicare HMO | Source: Ambulatory Visit | Attending: Hematology and Oncology | Admitting: Hematology and Oncology

## 2018-07-27 ENCOUNTER — Encounter (HOSPITAL_COMMUNITY): Payer: Self-pay | Admitting: Hematology

## 2018-07-27 ENCOUNTER — Other Ambulatory Visit: Payer: Self-pay

## 2018-07-27 ENCOUNTER — Ambulatory Visit: Payer: Medicare HMO

## 2018-07-27 ENCOUNTER — Inpatient Hospital Stay (HOSPITAL_COMMUNITY): Payer: Medicare HMO

## 2018-07-27 DIAGNOSIS — E785 Hyperlipidemia, unspecified: Secondary | ICD-10-CM | POA: Diagnosis not present

## 2018-07-27 DIAGNOSIS — Z5181 Encounter for therapeutic drug level monitoring: Secondary | ICD-10-CM | POA: Insufficient documentation

## 2018-07-27 DIAGNOSIS — Z171 Estrogen receptor negative status [ER-]: Secondary | ICD-10-CM

## 2018-07-27 DIAGNOSIS — C50411 Malignant neoplasm of upper-outer quadrant of right female breast: Secondary | ICD-10-CM | POA: Diagnosis not present

## 2018-07-27 DIAGNOSIS — Z79899 Other long term (current) drug therapy: Secondary | ICD-10-CM | POA: Diagnosis not present

## 2018-07-27 DIAGNOSIS — I10 Essential (primary) hypertension: Secondary | ICD-10-CM | POA: Diagnosis not present

## 2018-07-27 DIAGNOSIS — G629 Polyneuropathy, unspecified: Secondary | ICD-10-CM | POA: Diagnosis not present

## 2018-07-27 DIAGNOSIS — I071 Rheumatic tricuspid insufficiency: Secondary | ICD-10-CM | POA: Diagnosis not present

## 2018-07-27 DIAGNOSIS — Z7982 Long term (current) use of aspirin: Secondary | ICD-10-CM

## 2018-07-27 DIAGNOSIS — Z8701 Personal history of pneumonia (recurrent): Secondary | ICD-10-CM

## 2018-07-27 DIAGNOSIS — K219 Gastro-esophageal reflux disease without esophagitis: Secondary | ICD-10-CM | POA: Diagnosis not present

## 2018-07-27 DIAGNOSIS — E041 Nontoxic single thyroid nodule: Secondary | ICD-10-CM | POA: Diagnosis not present

## 2018-07-27 DIAGNOSIS — Z806 Family history of leukemia: Secondary | ICD-10-CM | POA: Diagnosis not present

## 2018-07-27 NOTE — Progress Notes (Signed)
[facility] CONSULT NOTE  Patient Care Team: [name], MD as PCP - General (Internal Medicine) [name], MD as Consulting Physician (Dermatology)  CHIEF COMPLAINTS/PURPOSE OF CONSULTATION:  Newly diagnosed breast cancer  HISTORY OF PRESENTING ILLNESS:  Adrienne HOEFER 84 y.o. female is seen in consultation today for further work-up and management of right breast cancer.  About 2 months ago she started noticing mass and redness in her right breast.  She had mammogram and right breast ultrasound done on 07/02/2018 showing a 7.2 x 4.3 x 4.2 cm right breast mass at 11 o'clock position with adjacent skin involvement and extending deep towards the pectoralis muscle.  Additional irregular mass in the right breast at 4:00 measuring 1.3 x 1 x 2.6 cm.  Irregular mass in the right axillary tail measures 1.2 x 0.9 x 1 cm.  Suspicious lymph node in the right axilla measures 1.8 x 1 x 0.8 cm.  Dominant right breast mass, right axillary lymph node and left breast mass at 2 o'clock position were biopsied.  Patient denies any new onset pains.  She was seen by Dr. Lindi Adie who ordered CT scan and bone scan.  28-monthcourse of neoadjuvant HER-2 directed therapy with Herceptin was recommended.  She was sent to our clinic for receiving treatments close to home.  She lives at home with her husband and is able to do all her ADLs and IADLs.  I reviewed her records extensively and collaborated the history with the patient.  SUMMARY OF ONCOLOGIC HISTORY:   Malignant neoplasm of upper-outer quadrant of right breast in female, estrogen receptor negative ([place]   07/03/2018 Initial Diagnosis    Palpable right breast mass for several months with skin discoloration, clinically skin trabecular thickening with nipple retraction measuring 7.2 x 4.3 x 4.2 cm, additional mass 4 o'clock position 2.6 cm, right axillary tail 1.2 cm right axillary lymph node 1.8 cm left breast irregular mass 1 cm, adjacent mass 0.9  cm together measuring 1.9 cm, no lymphadenopathy    07/03/2018 Pathology Results    Right breast biopsy: IDC grade 2, ER 0%, PR 0%, HER-2 positive, Ki-67 40%, lymph node biopsy negative, left breast biopsy complex sclerosing lesion     07/15/2018 Cancer Staging    Staging form: Breast, AJCC 8th Edition - Clinical: Stage IIB (cT3, cN0, cM0, G2, ER-, PR-, HER2+) - Signed by GNicholas Lose MD on 07/15/2018    07/17/2018 -  Chemotherapy    The patient had trastuzumab (HERCEPTIN) 504 mg in sodium chloride 0.9 % 250 mL chemo infusion, 6 mg/kg = 504 mg, Intravenous,  Once, 0 of 13 cycles  for chemotherapy treatment.      In terms of breast cancer risk profile:  She menarched at early age of 89and went to menopause in her 89s She had 2 pregnancies, her first child was born at age 89She never used OCPs. Denies using any HRT. Brother had leukemia.  Denies any family history of breast or ovarian malignancy.  MEDICAL HISTORY:  Past Medical History:  Diagnosis Date  . [place]   right breast cancer  . GERD (gastroesophageal reflux disease)    occasional   . Hyperlipidemia   . Hypertension    clearance with note Dr [place] Cr[place] chart  . Pneumonia    2 years ago/ states occ cough with sinus drainage- no fever  . Thyroid nodule    with biopsy- states following medically    SURGICAL HISTORY: Past Surgical  History:  Procedure Laterality Date  . CATARACT EXTRACTION W/PHACO  11/30/2012   Procedure: CATARACT EXTRACTION PHACO AND INTRAOCULAR LENS PLACEMENT (IOC);  Surgeon: Carroll F Haines, MD;  Location: [facility];  Service: Ophthalmology;  Laterality: Left;  CDE:11.49  . CATARACT EXTRACTION W/PHACO Right 03/15/2013   Procedure: CATARACT EXTRACTION PHACO AND INTRAOCULAR LENS PLACEMENT (IOC);  Surgeon: Carroll F Haines, MD;  Location: [facility];  Service: Ophthalmology;  Laterality: Right;  CDE 16.15  . COLONOSCOPY    . EXCISION OF SKIN TAG Right 06/17/2016   Procedure: SHAVE OF SKIN TAG RIGHT  BUTTOCK;  Surgeon: Mark Jenkins, MD;  Location: [facility];  Service: General;  Laterality: Right;  . JOINT REPLACEMENT     left knee  . MASS EXCISION Left 06/17/2016   Procedure: EXCISION SKIN MALIGNANT LESION LEFT BUTTOCK;  Surgeon: Mark Jenkins, MD;  Location: [facility];  Service: General;  Laterality: Left;  . TOTAL KNEE ARTHROPLASTY  12/16/2011   Procedure: TOTAL KNEE ARTHROPLASTY;  Surgeon: Frank V Aluisio, MD;  Location: [facility];  Service: Orthopedics;  Laterality: Right;  . TUBAL LIGATION      SOCIAL HISTORY: Social History   Socioeconomic History  . Marital status: Married    Spouse name: Not on file  . Number of children: 2  . Years of education: some business school after HS  . Highest education level: Not on file  Occupational History  . Occupation: Realtor  . Occupation: Secretary   Social Needs  . Financial resource strain: Not hard at all  . Food insecurity:    Worry: Never true    Inability: Never true  . Transportation needs:    Medical: No    Non-medical: No  Tobacco Use  . Smoking status: Never Smoker  . Smokeless tobacco: Never Used  Substance and Sexual Activity  . Alcohol use: No    Frequency: Never  . Drug use: No  . Sexual activity: Yes    Birth control/protection: None  Lifestyle  . Physical activity:    Days per week: 0 days    Minutes per session: 0 min  . Stress: Not at all  Relationships  . Social connections:    Talks on phone: More than three times a week    Gets together: More than three times a week    Attends religious service: More than 4 times per year    Active member of club or organization: Yes    Attends meetings of clubs or organizations: Never    Relationship status: Married  . Intimate partner violence:    Fear of current or ex partner: No    Emotionally abused: No    Physically abused: No    Forced sexual activity: No  Other Topics Concern  . Not on file  Social History Narrative   Lives at home with her husband.   4 cups  caffeine per day.   Right-handed.    FAMILY HISTORY: Family History  Problem Relation Age of Onset  . Heart attack Mother   . Bronchitis Father   . Diabetes Sister   . Leukemia Brother   . Lung disease Brother   . Lung disease Sister     ALLERGIES:  is allergic to penicillins.  MEDICATIONS:  Current Outpatient Medications  Medication Sig Dispense Refill  . acetaminophen (TYLENOL) 325 MG tablet Take 650 mg by mouth every 6 (six) hours as needed.    . cetirizine (ZYRTEC) 10 MG tablet Take 10 mg by mouth daily.    .   diphenhydrAMINE (BENADRYL) 25 MG tablet Take 25 mg by mouth at bedtime as needed.    . losartan (COZAAR) 100 MG tablet Take 100 mg by mouth daily.     . pantoprazole (PROTONIX) 40 MG tablet Take 40 mg by mouth daily.    . aspirin 325 MG tablet Take 162.5 mg by mouth daily.     . lidocaine-prilocaine (EMLA) cream Apply to affected area once (Patient not taking: Reported on 07/27/2018) 30 g 3  . Multiple Vitamin (MULTIVITAMIN WITH MINERALS) TABS tablet Take 1 tablet by mouth daily.    . Omega-3 Fatty Acids (FISH OIL PO) Take 2 capsules by mouth daily.      No current facility-administered medications for this visit.     REVIEW OF SYSTEMS:   Constitutional: Denies fevers, chills or abnormal night sweats Eyes: Denies blurriness of vision, double vision or watery eyes Ears, nose, mouth, throat, and face: Denies mucositis or sore throat Respiratory: Denies cough, dyspnea or wheezes Cardiovascular: Denies palpitation, chest discomfort or lower extremity swelling Gastrointestinal:  Denies nausea, heartburn or change in bowel habits Skin: Denies abnormal skin rashes Lymphatics: Denies new lymphadenopathy or easy bruising Neurological:Denies numbness, tingling or new weaknesses Behavioral/Psych: Mood is stable, no new changes  Breast: Occasional stinging type of pain present in the right breast. All other systems were reviewed with the patient and are negative.  PHYSICAL  EXAMINATION: ECOG PERFORMANCE STATUS: 1 - Symptomatic but completely ambulatory  I have reviewed her vitals.  Blood pressure is 159/90 pulse is 104.  Temperature is 98.3 and respiratory rate is 18.  Oxygen saturation is 100. GENERAL:alert, no distress and comfortable SKIN: skin color, texture, turgor are normal, no rashes or significant lesions EYES: normal, conjunctiva are pink and non-injected, sclera clear OROPHARYNX:no exudate, no erythema and lips, buccal mucosa, and tongue normal  NECK: supple, thyroid normal size, non-tender, without nodularity LYMPH:  no palpable lymphadenopathy in the cervical, axillary or inguinal LUNGS: clear to auscultation and percussion with normal breathing effort HEART: regular rate & rhythm and no murmurs and trace lower extremity edema ABDOMEN:abdomen soft, non-tender and normal bowel sounds Musculoskeletal:no cyanosis of digits and no clubbing  PSYCH: alert & oriented x 3 with fluent speech NEURO: no focal motor/sensory deficits BREAST: Right breast skin has thickening and erythema with warmth.  There is a mass palpable in the upper outer quadrant which is about 8 cm in size.  There is no clear axillary adenopathy palpable.  Erythema is occupying majority of the breast.  Nipple retraction present.  (exam performed in the presence of a chaperone)  Media Information   Document Information   Photos    07/27/2018 13:00  Attached To:  Office Visit on 07/27/18 with , , MD  Source Information   , , MD  [facility]     LABORATORY DATA:  I have reviewed the data as listed Lab Results  Component Value Date   WBC 13.1 (H) 01/21/2018   HGB 10.9 (L) 01/21/2018   HCT 32.7 (L) 01/21/2018   MCV 89.3 01/21/2018   PLT 361 01/21/2018   Lab Results  Component Value Date   NA 131 (L) 01/21/2018   K 4.6 01/21/2018   CL 99 (L) 01/21/2018   CO2 22 01/21/2018    RADIOGRAPHIC STUDIES: I have personally reviewed the  radiological reports and agreed with the findings in the report.  ASSESSMENT AND PLAN:  Malignant neoplasm of upper-outer quadrant of right breast in female, estrogen receptor negative (HCC) 1.    Stage IIIb (T4N0) right breast invasive ductal carcinoma, ER/PR negative and HER-2 positive: - 74-monthhistory of lump and redness along with itching and occasional stinging pain - Mammogram/breast ultrasound on 07/02/2018 showing a 7.2 x 4.3 x 4.2 cm at 11 o'clock position with involvement of adjacent skin extending deep to worse pectoralis muscle, additional mass at 4 o'clock position measuring 2.6 cm, right axillary tail irregular mass 1.2 cm, right axillary lymph node 1.8 cm. - Right breast biopsy at 11:30 position on 07/03/2018 shows invasive ductal carcinoma, ER/PR negative, HER-2 positive, Ki-67 of 40%, right axillary lymph node biopsy shows benign tissue, left breast core needle biopsy at 230 o'clock position shows complex sclerosing lesion - CT chest abdomen and pelvis on 07/16/2018 showed a 6 mm nodule in the posterior left lower lobe with no other evidence of metastatic disease. -Bone scan on 07/16/2018 shows focal area of increased activity over the distal left clavicle/shoulder consistent with degenerative changes. -She was evaluated by Dr.'s WDonne Hazeland GLindi Adieand was recommended neoadjuvant Herceptin therapy for at least 6 months followed by surgical resection. - Based on the erythema, and warmth and duration of history of 2 months I think it fits diagnostic criteria for inflammatory breast cancer.  I would ask Dr. WDonne Hazelto consider a full-thickness skin punch biopsy tomorrow when she is due to get her port placed. -She has an echocardiogram scheduled later today. - I have recommended trying weekly paclitaxel (at 80 mg/m weekly for 12 weeks) along with Herceptin.  We discussed the side effects in detail.  We can always discontinue paclitaxel if she does not tolerate it.  She is agreeable to this  plan.   All questions were answered. The patient knows to call the clinic with any problems, questions or concerns.    SDerek Jack MD 07/27/18

## 2018-07-27 NOTE — Progress Notes (Signed)
PA for Emla cream has been submitted. 

## 2018-07-27 NOTE — Progress Notes (Signed)
*  PRELIMINARY RESULTS* Echocardiogram 2D Echocardiogram has been performed.  [name] 07/27/2018, 4:11 PM

## 2018-07-27 NOTE — Assessment & Plan Note (Addendum)
1.  Stage IIIb (T4N0) right breast invasive ductal carcinoma, ER/PR negative and HER-2 positive: - 74-monthhistory of lump and redness along with itching and occasional stinging pain - Mammogram/breast ultrasound on 07/02/2018 showing a 7.2 x 4.3 x 4.2 cm at 11 o'clock position with involvement of adjacent skin extending deep to worse pectoralis muscle, additional mass at 4 o'clock position measuring 2.6 cm, right axillary tail irregular mass 1.2 cm, right axillary lymph node 1.8 cm. - Right breast biopsy at 11:30 position on 07/03/2018 shows invasive ductal carcinoma, ER/PR negative, HER-2 positive, Ki-67 of 40%, right axillary lymph node biopsy shows benign tissue, left breast core needle biopsy at 230 o'clock position shows complex sclerosing lesion - CT chest abdomen and pelvis on 07/16/2018 showed a 6 mm nodule in the posterior left lower lobe with no other evidence of metastatic disease. -Bone scan on 07/16/2018 shows focal area of increased activity over the distal left clavicle/shoulder consistent with degenerative changes. -She was evaluated by Dr.'s WDonne Hazeland GLindi Adieand was recommended neoadjuvant Herceptin therapy for at least 6 months followed by surgical resection. - Based on the erythema, and warmth and duration of history of 2 months I think it fits diagnostic criteria for inflammatory breast cancer.  I would ask Dr. WDonne Hazelto consider a full-thickness skin punch biopsy tomorrow when she is due to get her port placed. -She has an echocardiogram scheduled later today. - I have recommended trying weekly paclitaxel (at 80 mg/m weekly for 12 weeks) along with Herceptin.  We discussed the side effects in detail.  We can always discontinue paclitaxel if she does not tolerate it.  She is agreeable to this plan.

## 2018-07-27 NOTE — Patient Instructions (Signed)
[facility] Discharge Instructions     Thank you for choosing [facility] at [facility] to provide your oncology and hematology care.  To afford each patient quality time with our provider, please arrive at least 15 minutes before your scheduled appointment time.   If you have a lab appointment with the Cancer Center please come in thru the  Main Entrance and check in at the main information desk  You need to re-schedule your appointment should you arrive 10 or more minutes late.  We strive to give you quality time with our providers, and arriving late affects you and other patients whose appointments are after yours.  Also, if you no show three or more times for appointments you may be dismissed from the clinic at the providers discretion.     Again, thank you for choosing [facility].  Our hope is that these requests will decrease the amount of time that you wait before being seen by our physicians.       _____________________________________________________________  Should you have questions after your visit to [facility], please contact our office at [phone] between the hours of 8:00 a.m. and 4:30 p.m.  Voicemails left after 4:00 p.m. will not be returned until the following business day.  For prescription refill requests, have your pharmacy contact our office and allow 72 hours.    Cancer Center Support Programs:   > Cancer Support Group  2nd Tuesday of the month 1pm-2pm, Journey Room    

## 2018-07-28 ENCOUNTER — Ambulatory Visit (HOSPITAL_COMMUNITY): Payer: Medicare HMO

## 2018-07-28 ENCOUNTER — Encounter (HOSPITAL_BASED_OUTPATIENT_CLINIC_OR_DEPARTMENT_OTHER)
Admission: RE | Disposition: A | Discharge status: Home / Self Care | Payer: Self-pay | Source: Ambulatory Visit | Attending: General Surgery

## 2018-07-28 ENCOUNTER — Encounter (HOSPITAL_BASED_OUTPATIENT_CLINIC_OR_DEPARTMENT_OTHER): Payer: Self-pay | Admitting: *Deleted

## 2018-07-28 ENCOUNTER — Ambulatory Visit (HOSPITAL_BASED_OUTPATIENT_CLINIC_OR_DEPARTMENT_OTHER)
Admission: RE | Admit: 2018-07-28 | Discharge: 2018-07-28 | Disposition: A | Discharge status: Home / Self Care | Payer: Medicare HMO | Source: Ambulatory Visit | Attending: General Surgery | Admitting: General Surgery

## 2018-07-28 ENCOUNTER — Ambulatory Visit (HOSPITAL_BASED_OUTPATIENT_CLINIC_OR_DEPARTMENT_OTHER): Payer: Medicare HMO | Admitting: Anesthesiology

## 2018-07-28 ENCOUNTER — Other Ambulatory Visit: Payer: Self-pay

## 2018-07-28 DIAGNOSIS — K219 Gastro-esophageal reflux disease without esophagitis: Secondary | ICD-10-CM | POA: Diagnosis not present

## 2018-07-28 DIAGNOSIS — Z79899 Other long term (current) drug therapy: Secondary | ICD-10-CM | POA: Insufficient documentation

## 2018-07-28 DIAGNOSIS — Z95828 Presence of other vascular implants and grafts: Secondary | ICD-10-CM

## 2018-07-28 DIAGNOSIS — C50411 Malignant neoplasm of upper-outer quadrant of right female breast: Secondary | ICD-10-CM | POA: Diagnosis not present

## 2018-07-28 DIAGNOSIS — Z452 Encounter for adjustment and management of vascular access device: Secondary | ICD-10-CM | POA: Diagnosis not present

## 2018-07-28 DIAGNOSIS — I10 Essential (primary) hypertension: Secondary | ICD-10-CM | POA: Diagnosis not present

## 2018-07-28 DIAGNOSIS — E785 Hyperlipidemia, unspecified: Secondary | ICD-10-CM | POA: Diagnosis not present

## 2018-07-28 DIAGNOSIS — C50911 Malignant neoplasm of unspecified site of right female breast: Secondary | ICD-10-CM | POA: Insufficient documentation

## 2018-07-28 DIAGNOSIS — C792 Secondary malignant neoplasm of skin: Secondary | ICD-10-CM | POA: Insufficient documentation

## 2018-07-28 DIAGNOSIS — R918 Other nonspecific abnormal finding of lung field: Secondary | ICD-10-CM | POA: Diagnosis not present

## 2018-07-28 DIAGNOSIS — C50919 Malignant neoplasm of unspecified site of unspecified female breast: Secondary | ICD-10-CM

## 2018-07-28 HISTORY — PX: PORTACATH PLACEMENT: SHX2246

## 2018-07-28 HISTORY — PX: PUNCH BIOPSY OF SKIN: SHX6390

## 2018-07-28 HISTORY — DX: Malignant (primary) neoplasm, unspecified: C80.1

## 2018-07-28 SURGERY — PUNCH BIOPSY, SKIN
Anesthesia: General | Site: Chest | Laterality: Right

## 2018-07-28 MED ORDER — PROPOFOL 10 MG/ML IV BOLUS
INTRAVENOUS | Status: DC | PRN
  Administered 2018-07-28: 120 mg via INTRAVENOUS

## 2018-07-28 MED ORDER — PHENYLEPHRINE HCL 10 MG/ML IJ SOLN
INTRAMUSCULAR | Status: DC | PRN
  Administered 2018-07-28: 80 ug via INTRAVENOUS

## 2018-07-28 MED ORDER — FENTANYL CITRATE (PF) 100 MCG/2ML IJ SOLN
50.0000 ug | INTRAMUSCULAR | Status: AC | PRN
  Administered 2018-07-28: 50 ug via INTRAVENOUS
  Administered 2018-07-28 (×2): 25 ug via INTRAVENOUS

## 2018-07-28 MED ORDER — HEPARIN (PORCINE) IN NACL 1000-0.9 UT/500ML-% IV SOLN
INTRAVENOUS | Status: AC
  Filled 2018-07-28: qty 500

## 2018-07-28 MED ORDER — ONDANSETRON HCL 4 MG/2ML IJ SOLN
INTRAMUSCULAR | Status: DC | PRN
  Administered 2018-07-28: 4 mg via INTRAVENOUS

## 2018-07-28 MED ORDER — ACETAMINOPHEN 500 MG PO TABS
ORAL_TABLET | ORAL | Status: AC
  Filled 2018-07-28: qty 2

## 2018-07-28 MED ORDER — BUPIVACAINE HCL (PF) 0.25 % IJ SOLN
INTRAMUSCULAR | Status: AC
  Filled 2018-07-28: qty 30

## 2018-07-28 MED ORDER — HEPARIN (PORCINE) IN NACL 2-0.9 UNITS/ML
INTRAMUSCULAR | Status: AC | PRN
  Administered 2018-07-28: 1 via INTRAVENOUS

## 2018-07-28 MED ORDER — HEPARIN SOD (PORK) LOCK FLUSH 100 UNIT/ML IV SOLN
INTRAVENOUS | Status: AC
  Filled 2018-07-28: qty 5

## 2018-07-28 MED ORDER — FENTANYL CITRATE (PF) 100 MCG/2ML IJ SOLN: 25.0000 ug | INTRAMUSCULAR | Status: DC | PRN

## 2018-07-28 MED ORDER — DEXAMETHASONE SODIUM PHOSPHATE 4 MG/ML IJ SOLN
INTRAMUSCULAR | Status: DC | PRN
  Administered 2018-07-28: 10 mg via INTRAVENOUS

## 2018-07-28 MED ORDER — MIDAZOLAM HCL 2 MG/2ML IJ SOLN: 1.0000 mg | INTRAMUSCULAR | Status: DC | PRN

## 2018-07-28 MED ORDER — ACETAMINOPHEN 500 MG PO TABS
1000.0000 mg | ORAL_TABLET | ORAL | Status: AC
  Administered 2018-07-28: 1000 mg via ORAL

## 2018-07-28 MED ORDER — LIDOCAINE HCL (CARDIAC) PF 100 MG/5ML IV SOSY
PREFILLED_SYRINGE | INTRAVENOUS | Status: DC | PRN
  Administered 2018-07-28: 80 mg via INTRAVENOUS

## 2018-07-28 MED ORDER — DEXAMETHASONE SODIUM PHOSPHATE 10 MG/ML IJ SOLN
INTRAMUSCULAR | Status: AC
  Filled 2018-07-28: qty 1

## 2018-07-28 MED ORDER — LIDOCAINE 2% (20 MG/ML) 5 ML SYRINGE
INTRAMUSCULAR | Status: AC
  Filled 2018-07-28: qty 5

## 2018-07-28 MED ORDER — BUPIVACAINE HCL (PF) 0.25 % IJ SOLN
INTRAMUSCULAR | Status: DC | PRN
  Administered 2018-07-28: 8 mL

## 2018-07-28 MED ORDER — CIPROFLOXACIN IN D5W 400 MG/200ML IV SOLN
INTRAVENOUS | Status: AC
  Filled 2018-07-28: qty 200

## 2018-07-28 MED ORDER — TRAMADOL HCL 50 MG PO TABS: 50.0000 mg | ORAL_TABLET | Freq: Four times a day (QID) | ORAL | 0 refills | Status: DC | PRN

## 2018-07-28 MED ORDER — LACTATED RINGERS IV SOLN
INTRAVENOUS | Status: DC
  Administered 2018-07-28 (×2): via INTRAVENOUS

## 2018-07-28 MED ORDER — ENSURE PRE-SURGERY PO LIQD: 296.0000 mL | Freq: Once | ORAL | Status: DC

## 2018-07-28 MED ORDER — CIPROFLOXACIN IN D5W 400 MG/200ML IV SOLN
400.0000 mg | INTRAVENOUS | Status: AC
  Administered 2018-07-28: 400 mg via INTRAVENOUS

## 2018-07-28 MED ORDER — SCOPOLAMINE 1 MG/3DAYS TD PT72: 1.0000 | MEDICATED_PATCH | Freq: Once | TRANSDERMAL | Status: DC | PRN

## 2018-07-28 MED ORDER — HEPARIN SOD (PORK) LOCK FLUSH 100 UNIT/ML IV SOLN
INTRAVENOUS | Status: DC | PRN
  Administered 2018-07-28: 500 [IU] via INTRAVENOUS

## 2018-07-28 MED ORDER — FENTANYL CITRATE (PF) 100 MCG/2ML IJ SOLN
INTRAMUSCULAR | Status: AC
  Filled 2018-07-28: qty 2

## 2018-07-28 MED ORDER — EPHEDRINE SULFATE 50 MG/ML IJ SOLN
INTRAMUSCULAR | Status: DC | PRN
  Administered 2018-07-28: 10 mg via INTRAVENOUS

## 2018-07-28 MED ORDER — ONDANSETRON HCL 4 MG/2ML IJ SOLN
INTRAMUSCULAR | Status: AC
  Filled 2018-07-28: qty 2

## 2018-07-28 MED ORDER — BACITRACIN 500 UNIT/GM EX OINT
TOPICAL_OINTMENT | CUTANEOUS | Status: DC | PRN
  Administered 2018-07-28: 1 via TOPICAL

## 2018-07-28 MED ORDER — BACITRACIN ZINC 500 UNIT/GM EX OINT
TOPICAL_OINTMENT | CUTANEOUS | Status: AC
  Filled 2018-07-28: qty 0.9

## 2018-07-28 SURGICAL SUPPLY — 53 items
BAG DECANTER FOR FLEXI CONT (MISCELLANEOUS) ×3 IMPLANT
BENZOIN TINCTURE PRP APPL 2/3 (GAUZE/BANDAGES/DRESSINGS) ×3 IMPLANT
BLADE SURG 11 STRL SS (BLADE) ×3 IMPLANT
BLADE SURG 15 STRL LF DISP TIS (BLADE) ×2 IMPLANT
BLADE SURG 15 STRL SS (BLADE) ×1
BNDG GAUZE 1X2.1 STRL (MISCELLANEOUS) ×3 IMPLANT
CANISTER SUCT 1200ML W/VALVE (MISCELLANEOUS) IMPLANT
CHLORAPREP W/TINT 26ML (MISCELLANEOUS) ×3 IMPLANT
COVER BACK TABLE 60X90IN (DRAPES) ×3 IMPLANT
COVER MAYO STAND STRL (DRAPES) ×3 IMPLANT
COVER PROBE 5X48 (MISCELLANEOUS) ×1
DECANTER SPIKE VIAL GLASS SM (MISCELLANEOUS) IMPLANT
DERMABOND ADVANCED (GAUZE/BANDAGES/DRESSINGS) ×1
DERMABOND ADVANCED .7 DNX12 (GAUZE/BANDAGES/DRESSINGS) ×2 IMPLANT
DRAPE C-ARM 42X72 X-RAY (DRAPES) ×3 IMPLANT
DRAPE LAPAROSCOPIC ABDOMINAL (DRAPES) ×3 IMPLANT
DRAPE UTILITY XL STRL (DRAPES) ×3 IMPLANT
DRSG TEGADERM 4X4.75 (GAUZE/BANDAGES/DRESSINGS) IMPLANT
ELECT COATED BLADE 2.86 ST (ELECTRODE) ×3 IMPLANT
ELECT REM PT RETURN 9FT ADLT (ELECTROSURGICAL) ×3
ELECTRODE REM PT RTRN 9FT ADLT (ELECTROSURGICAL) ×2 IMPLANT
GAUZE SPONGE 4X4 12PLY STRL LF (GAUZE/BANDAGES/DRESSINGS) ×3 IMPLANT
GLOVE BIO SURGEON STRL SZ7 (GLOVE) ×6 IMPLANT
GLOVE BIOGEL PI IND STRL 7.0 (GLOVE) ×2 IMPLANT
GLOVE BIOGEL PI IND STRL 7.5 (GLOVE) ×2 IMPLANT
GLOVE BIOGEL PI INDICATOR 7.0 (GLOVE) ×1
GLOVE BIOGEL PI INDICATOR 7.5 (GLOVE) ×1
GOWN STRL REUS W/ TWL LRG LVL3 (GOWN DISPOSABLE) ×4 IMPLANT
GOWN STRL REUS W/TWL LRG LVL3 (GOWN DISPOSABLE) ×2
IV KIT MINILOC 20X1 SAFETY (NEEDLE) IMPLANT
KIT CVR 48X5XPRB PLUP LF (MISCELLANEOUS) ×2 IMPLANT
KIT PORT POWER 8FR ISP CVUE (Port) ×3 IMPLANT
NDL SAFETY ECLIPSE 18X1.5 (NEEDLE) IMPLANT
NEEDLE HYPO 18GX1.5 SHARP (NEEDLE)
NEEDLE HYPO 25X1 1.5 SAFETY (NEEDLE) ×3 IMPLANT
PACK BASIN DAY SURGERY FS (CUSTOM PROCEDURE TRAY) ×3 IMPLANT
PENCIL BUTTON HOLSTER BLD 10FT (ELECTRODE) ×3 IMPLANT
PUNCH BIOPSY DERMAL 4MM (MISCELLANEOUS) ×3 IMPLANT
SLEEVE SCD COMPRESS KNEE MED (MISCELLANEOUS) ×3 IMPLANT
STRIP CLOSURE SKIN 1/2X4 (GAUZE/BANDAGES/DRESSINGS) ×3 IMPLANT
SUT ETHILON 3 0 PS 1 (SUTURE) ×3 IMPLANT
SUT MNCRL AB 4-0 PS2 18 (SUTURE) ×3 IMPLANT
SUT PROLENE 2 0 SH DA (SUTURE) ×3 IMPLANT
SUT SILK 2 0 TIES 17X18 (SUTURE)
SUT SILK 2-0 18XBRD TIE BLK (SUTURE) IMPLANT
SUT VIC AB 3-0 SH 27 (SUTURE) ×1
SUT VIC AB 3-0 SH 27X BRD (SUTURE) ×2 IMPLANT
SYR 5ML LUER SLIP (SYRINGE) ×3 IMPLANT
SYR CONTROL 10ML LL (SYRINGE) ×3 IMPLANT
TOWEL GREEN STERILE FF (TOWEL DISPOSABLE) ×3 IMPLANT
TOWEL OR NON WOVEN STRL DISP B (DISPOSABLE) ×3 IMPLANT
TUBE CONNECTING 20X1/4 (TUBING) IMPLANT
YANKAUER SUCT BULB TIP NO VENT (SUCTIONS) IMPLANT

## 2018-07-28 NOTE — Discharge Instructions (Signed)

## 2018-07-28 NOTE — H&P (Signed)
84 yof referred by Dr Nevada Crane for new right breast cancer. [name] noted a right breast mass a couple months ago. this has gotten "harder" but not bigger. no pain. no nipple dc. no prior breast history and no family history of breast or ovarian cancer. [name] has b density breasts on mm. on mm and [place] there is a 7.1 cm mass with additional right sided areas on [place]. there is ax node mass as well. there are additional suspicious masses in the lower inner right breast and the axillary tail. these have not been biopsiedthere is indeterminate left breast mass. the biopsy of the left is csl with excision recommended by radiology. the ax node mass is benign but there is no nodal tissue and I think this is discordant. the biopsy of the breast mass is grade II IDC that is er/pr neg, her 2 pos and Ki is 40%. [name] is here with her husband to discuss options  Past Surgical History Sabino Gasser; 07/10/2018 10:39 AM) Knee Surgery  Bilateral.  Diagnostic Studies History Sabino Gasser; 07/10/2018 10:39 AM) Colonoscopy  5-10 years ago Mammogram  within last year Pap Smear  1-5 years ago  Allergies Sabino Gasser; 07/10/2018 10:39 AM) Penicillins   Medication History Sabino Gasser; 07/10/2018 10:40 AM) Losartan Potassium (100MG Tablet, Oral) Active. Medications Reconciled  Family History Sabino Gasser; 07/10/2018 10:39 AM) First Degree Relatives  No pertinent family history   Pregnancy / Birth History Sabino Gasser; 07/10/2018 10:39 AM) Durenda Age  2 Maternal age  58-35 Para  2  Other Problems Sabino Gasser; 07/10/2018 10:39 AM) No pertinent past medical history     Review of Systems Sabino Gasser; 07/10/2018 10:39 AM) Skin Not Present- Change in Wart/Mole, Dryness, Hives, Jaundice, New Lesions, Non-Healing Wounds, Rash and Ulcer. Gastrointestinal Not Present- Abdominal Pain, Bloating, Bloody Stool, Change in Bowel Habits, Chronic diarrhea, Constipation, Difficulty Swallowing, Excessive gas, Gets  full quickly at meals, Hemorrhoids, Indigestion, Nausea, Rectal Pain and Vomiting. Musculoskeletal Not Present- Back Pain, Joint Pain, Joint Stiffness, Muscle Pain, Muscle Weakness and Swelling of Extremities. Endocrine Not Present- Cold Intolerance, Excessive Hunger, Hair Changes, Heat Intolerance, Hot flashes and New Diabetes. Hematology Not Present- Blood Thinners, Easy Bruising, Excessive bleeding, Gland problems, HIV and Persistent Infections.  Vitals Sabino Gasser; 07/10/2018 10:40 AM) 07/10/2018 10:40 AM Weight: 184.38 lb Height: 64in Body Surface Area: 1.89 m Body Mass Index: 31.65 kg/m  Temp.: 98.78F(Oral)  Pulse: 98 (Regular)  BP: 134/82 (Sitting, Left Arm, Standard) Physical Exam Rolm Bookbinder MD; 07/10/2018 5:30 PM) General Mental Status-Alert. Head and Neck Trachea-midline. Eye Sclera/Conjunctiva - Bilateral-No scleral icterus. Chest and Lung Exam Chest and lung exam reveals -quiet, even and easy respiratory effort with no use of accessory muscles and on auscultation, normal breath sounds, no adventitious sounds and normal vocal resonance. Breast Nipples-No Discharge. Note: right breast with overlying redness, tender, large central mass measuring at least 7 cm in size, mobile Cardiovascular Cardiovascular examination reveals -normal heart sounds, regular rate and rhythm with no murmurs. Abdomen Note: soft no hepatomegaly Neurologic Neurologic evaluation reveals -alert and oriented x 3 with no impairment of recent or remote memory. Lymphatic Head & Neck General Head & Neck Lymphatics: Bilateral - Description - Normal. Axillary -Note: palp right ax node, no left ax nodes. Note: no Qulin adenopathy   Assessment & Plan Rolm Bookbinder MD; 07/10/2018 5:43 PM) RIGHT BREAST CANCER WITH T3 TUMOR, >5 CM IN GREATEST DIMENSION (C50.911) Story: Clinical stage IIIA breast cancer I would stage her as III as I think  [name] has clinically palpable  nodes. not sure if inflammatory or just local extension of big mass. we discussed staging and pathophysiology of breast cancer including all available treatments. I think surgically [name] needs an mrm. [name] is elderly and appears to be fairly healthy although Im not sure how much of todays visit [name] understands. I think if [name] is candidate for any systemic therapy that would be best first option. [name] is her2 positive. No definitive stage IV disease. Will be treated by Dr Jamey Reas.  I will place port today and do skin punch biopsy.

## 2018-07-28 NOTE — Transfer of Care (Signed)
Immediate Anesthesia Transfer of Care Note  Patient: [name]  Procedure(s) Performed: PUNCH BIOPSY OF SKIN RIGHT BREAST (Right Breast) INSERTION PORT-A-CATH WITH ULTRASOUND (Right Chest)  Patient Location: PACU  Anesthesia Type:General  Level of Consciousness: sedated  Airway & Oxygen Therapy: Patient Spontanous Breathing and Patient connected to face mask oxygen  Post-op Assessment: Report given to RN and Post -op Vital signs reviewed and stable  Post vital signs: Reviewed and stable  Last Vitals:  Vitals Value Taken Time  BP 135/63 07/28/2018  9:25 AM  Temp    Pulse 88 07/28/2018  9:28 AM  Resp 12 07/28/2018  9:28 AM  SpO2 100 % 07/28/2018  9:28 AM  Vitals shown include unvalidated device data.  Last Pain:  Vitals:   07/28/18 0721  TempSrc: Oral  PainSc: 0-No pain      Patients Stated Pain Goal: 0 (26/71/24 5809)  Complications: No apparent anesthesia complications

## 2018-07-28 NOTE — Anesthesia Preprocedure Evaluation (Addendum)
Anesthesia Evaluation  Patient identified by MRN, date of birth, ID band Patient awake    Reviewed: Allergy & Precautions, NPO status , Patient's Chart, lab work & pertinent test results  Airway Mallampati: II  TM Distance: >3 FB Neck ROM: Full    Dental no notable dental hx. (+) Teeth Intact, Dental Advisory Given   Pulmonary neg pulmonary ROS,    Pulmonary exam normal breath sounds clear to auscultation       Cardiovascular hypertension, Pt. on medications Normal cardiovascular exam Rhythm:Regular Rate:Normal  HLD  TTE 07/27/2018 - Left ventricle: The cavity size was normal. Wall thickness was   increased in a pattern of moderate LVH. Systolic function was   normal. The estimated ejection fraction was in the range of 60%   to 65%. Wall motion was normal; there were no regional wall   motion abnormalities. Doppler parameters are consistent with   abnormal left ventricular relaxation (grade 1 diastolic   dysfunction). - Aortic valve: Mildly calcified annulus. Trileaflet; moderately   thickened leaflets. There was mild to moderate stenosis. Mean   gradient (S): 18 mm Hg. VTI ratio of LVOT to aortic valve: 0.32.   Valve area (VTI): 1.2 cm^2. Valve area (Vmax): 1.19 cm^2. Valve   area (Vmean): 1.14 cm^2. - Mitral valve: Mildly calcified annulus. Mildly thickened leaflets   Neuro/Psych negative neurological ROS  negative psych ROS   GI/Hepatic Neg liver ROS, GERD  ,  Endo/Other  negative endocrine ROS  Renal/GU negative Renal ROS  negative genitourinary   Musculoskeletal negative musculoskeletal ROS (+)   Abdominal   Peds  Hematology negative hematology ROS (+)   Anesthesia Other Findings Right breast cancer  Reproductive/Obstetrics                          Anesthesia Physical Anesthesia Plan  ASA: II  Anesthesia Plan: General   Post-op Pain Management:    Induction:  Intravenous  PONV Risk Score and Plan:   Airway Management Planned: LMA  Additional Equipment:   Intra-op Plan:   Post-operative Plan: Extubation in OR  Informed Consent: I have reviewed the patients History and Physical, chart, labs and discussed the procedure including the risks, benefits and alternatives for the proposed anesthesia with the patient or authorized representative who has indicated his/her understanding and acceptance.   Dental advisory given  Plan Discussed with: CRNA  Anesthesia Plan Comments:        Anesthesia Quick Evaluation

## 2018-07-28 NOTE — Op Note (Signed)
Preoperative diagnosis:breast cancer, stage III rule out inflammatory cancer Postoperative diagnosis: same as above Procedure: right ij US guided powerport insertion Right breast skin punch biopsy Surgeon: Dr Serita Grammes EBL: minimal Anes: general  Specimens right breast skin Complications none Drains none Sponge count correct Dispo to pacu stable  Indications: This is a 67 yof with either locally advanced IIIA right breast cancer or inflammatory cancer. She is due to get systemic therapy.  We discussed port placement and skin punch biopsy.   Procedure: After informed consent was obtained the patient was taken to the operating room. She was given antibiotics. Sequential compression devices were on her legs. She was then placed under general anesthesia with an LMA. Then she was prepped and draped in the standard sterile surgical fashion. Surgical timeout was then performed.  I used a 4 mm punch biopsy to biopsy two areas in the right breast where the skin was abnormal. I closed these with 3-0 nylon suture.  I then used the ultrasound to identify the right internal jugular vein. I then accessed the vein using the ultrasound.This aspirated blood. I then placed the wire. This was confirmed by fluoroscopy and ultrasound to be in the correct position.I tunneled the line between the 2 sites.I then dilated the tract and placed the dilator assembly with the sheath. This was done under fluoroscopy. I then removed the sheath and dilator. The wire was also removed. The line was then pulled back to be in the venacava. I hooked this up to the port. I sutured this into place with 2-0 Prolene in 2 places. This aspirated blood and flushed easily.This was confirmed with a final fluoroscopy. I then closed this with 2-0 Vicryl and 4-0 Monocryl.This withdrew blood and I placed heparin in it.Dermabond was placed on both the incisions.A dressing was placed. She tolerated this well and was  transferred to the recovery room in stable condition

## 2018-07-28 NOTE — Interval H&P Note (Signed)
History and Physical Interval Note:  07/28/2018 7:52 AM  [name]  has presented today for surgery, with the diagnosis of BREAST CANCER  The various methods of treatment have been discussed with the patient and family. After consideration of risks, benefits and other options for treatment, the patient has consented to  Procedure(s): INSERTION PORT-A-CATH WITH [place] (N/A) as a surgical intervention .  The patient's history has been reviewed, patient examined, no change in status, stable for surgery.  I have reviewed the patient's chart and labs.  Questions were answered to the patient's satisfaction.     Rolm Bookbinder

## 2018-07-28 NOTE — Anesthesia Postprocedure Evaluation (Signed)
Anesthesia Post Note  Patient: [name]  Procedure(s) Performed: PUNCH BIOPSY OF SKIN RIGHT BREAST (Right Breast) INSERTION PORT-A-CATH WITH ULTRASOUND (Right Chest)     Patient location during evaluation: PACU Anesthesia Type: General Level of consciousness: awake and alert Pain management: pain level controlled Vital Signs Assessment: post-procedure vital signs reviewed and stable Respiratory status: spontaneous breathing, nonlabored ventilation, respiratory function stable and patient connected to nasal cannula oxygen Cardiovascular status: blood pressure returned to baseline and stable Postop Assessment: no apparent nausea or vomiting Anesthetic complications: no    Last Vitals:  Vitals:   07/28/18 1000 07/28/18 1043  BP: (!) 149/71 (!) 149/70  Pulse: 78 82  Resp: 18 18  Temp:  36.4 C  SpO2: 98% 98%    Last Pain:  Vitals:   07/28/18 1043  TempSrc:   PainSc: 0-No pain                 Chais Fehringer L Daijha Leggio

## 2018-07-28 NOTE — Anesthesia Procedure Notes (Signed)
Procedure Name: LMA Insertion Date/Time: 07/28/2018 8:29 AM Performed by: Maryella Shivers, CRNA Pre-anesthesia Checklist: Patient identified, Emergency Drugs available, Suction available and Patient being monitored Patient Re-evaluated:Patient Re-evaluated prior to induction Oxygen Delivery Method: Circle system utilized Preoxygenation: Pre-oxygenation with 100% oxygen Induction Type: IV induction Ventilation: Mask ventilation without difficulty LMA: LMA inserted LMA Size: 4.0 Number of attempts: 1 Airway Equipment and Method: Bite block Placement Confirmation: positive ETCO2 Tube secured with: Tape Dental Injury: Teeth and Oropharynx as per pre-operative assessment

## 2018-07-29 ENCOUNTER — Encounter (HOSPITAL_BASED_OUTPATIENT_CLINIC_OR_DEPARTMENT_OTHER): Payer: Self-pay | Admitting: General Surgery

## 2018-07-29 MED ORDER — PROCHLORPERAZINE MALEATE 10 MG PO TABS: 10.0000 mg | ORAL_TABLET | Freq: Four times a day (QID) | ORAL | 1 refills | Status: DC | PRN

## 2018-07-29 MED ORDER — LIDOCAINE-PRILOCAINE 2.5-2.5 % EX CREA: TOPICAL_CREAM | CUTANEOUS | 3 refills | Status: DC

## 2018-07-29 NOTE — Progress Notes (Signed)
START OFF PATHWAY REGIMEN - Breast   [id number]:Paclitaxel + Trastuzumab:   A cycle is every 28 days:     Paclitaxel      Trastuzumab-xxxx      Trastuzumab-xxxx   **Always confirm dose/schedule in your pharmacy ordering system**  Patient Characteristics: Preoperative or Nonsurgical Candidate (Clinical Staging), Neoadjuvant Therapy followed by Surgery, Invasive Disease, Chemotherapy, HER2 Positive, ER Negative/Unknown Therapeutic Status: Preoperative or Nonsurgical Candidate (Clinical Staging) AJCC M Category: cM0 AJCC Grade: G3 Breast Surgical Plan: Neoadjuvant Therapy followed by Surgery ER Status: Negative (-) AJCC 8 Stage Grouping: IIIB HER2 Status: Positive (+) AJCC T Category: cT4d AJCC N Category: cN0 PR Status: Negative (-) Intent of Therapy: Curative Intent, Discussed with Patient

## 2018-07-29 NOTE — Addendum Note (Signed)
Addended by: Derek Jack on: 07/29/2018 04:53 PM   Modules accepted: Orders

## 2018-07-29 NOTE — Progress Notes (Signed)
Chemotherapy pulled together.

## 2018-07-29 NOTE — Patient Instructions (Addendum)
[facility] Chemotherapy Teaching   You have been diagnosed with stage IIb malignant breast cancer. We are going to be treating you with the intent of curing your disease.  You are going to be treated with Taxol ( paclitaxel) and Herceptin (trastuzumab). This will be given through your port a cath weekly.  You will see the doctor regularly throughout treatment.  We monitor your lab work prior to every treatment. The doctor monitors your response to treatment by the way you are feeling, your blood work, and scans periodically.  There will be wait times while you are here for treatment.  It will take about 30 minutes to 1 hour for your lab work to result.  Then there will be wait times while pharmacy mixes your medications.      You will receive the the following premedications prior to each of the Taxol treatments:  Tylenol and Benadryl: helps prevent reaction to chemotherapy.   Pepcid: an antihistamine that is used to reduce indigestion and heartburn, which can feel like and sometimes lead to nausea and vomiting.  Dexamethasone  - steroid - given to reduce the risk of you having an allergic type reaction to the chemotherapy. Dexamethasone can cause you to feel energized, nervous/anxious/jittery, make you have trouble sleeping, and/or make you feel hot/flushed in the face/neck and/or look pink/red in the face/neck. These side effects will pass as the medicine wears off.    Paclitaxel (Taxol)  About This Drug Paclitaxel is a drug used to treat cancer. It is given in the vein (IV).  This will take 1 hour to infuse.  This first infusion will take longer to infuse because it is increased slowly to monitor for reactions.  The nurse will be in the room with you for the first 15 minutes of the first infusion.  Possible Side Effects  Hair loss. Hair loss is often temporary, although with certain medicine, hair loss can sometimes be permanent. Hair loss may happen suddenly or gradually. If  you lose hair, you may lose it from your head, face, armpits, pubic area, chest, and/or legs. You may also notice your hair getting thin.  Swelling of your legs, ankles and/or feet (edema)  Flushing  Nausea and throwing up (vomiting)  Loose bowel movements (diarrhea)  Bone marrow depression. This is a decrease in the number of white blood cells, red blood cells, and platelets. This may raise your risk of infection, make you tired and weak (fatigue), and raise your risk of bleeding.  Effects on the nerves are called peripheral neuropathy. You may feel numbness, tingling, or pain in your hands and feet. It may be hard for you to button your clothes, open jars, or walk as usual. The effect on the nerves may get worse with more doses of the drug. These effects get better in some people after the drug is stopped but it does not get better in all people.  Changes in your liver function  Bone, joint and muscle pain  Abnormal EKG  Allergic reaction: Allergic reactions, including anaphylaxis are rare but may happen in some patients. Signs of allergic reaction to this drug may be swelling of the face, feeling like your tongue or throat are swelling, trouble breathing, rash, itching, fever, chills, feeling dizzy, and/or feeling that your heart is beating in a fast or not normal way. If this happens, do not take another dose of this drug. You should get urgent medical treatment.  Infection  Changes in your kidney function.  Note: Each of the side effects above was reported in 20% or greater of patients treated with paclitaxel. Not all possible side effects are included above.  Warnings and Precautions  Severe allergic reactions  Severe bone marrow depression  Treating Side Effects  To help with hair loss, wash with a mild shampoo and avoid washing your hair every day.  Avoid rubbing your scalp, instead, pat your hair or scalp dry  Avoid coloring your hair  Limit your use of hair spray,  electric curlers, blow dryers, and curling irons.  If you are interested in getting a wig, talk to your nurse. You can also call the [place] at 800-ACS-2345 to find out information about the Look Good, Feel Better program close to where you live. It is a free program where women getting chemotherapy can learn about wigs, turbans and scarves as well as makeup techniques and skin and nail care.  Ask your doctor or nurse about medicines that are available to help stop or lessen diarrhea and/or nausea.  To help with nausea and vomiting, eat small, frequent meals instead of three large meals a day. Choose foods and drinks that are at room temperature. Ask your nurse or doctor about other helpful tips and medicine that is available to help or stop lessen these symptoms.  If you get diarrhea, eat low-fiber foods that are high in protein and calories and avoid foods that can irritate your digestive tracts or lead to cramping. Ask your nurse or doctor about medicine that can lessen or stop your diarrhea.  Mouth care is very important. Your mouth care should consist of routine, gentle cleaning of your teeth or dentures and rinsing your mouth with a mixture of 1/2 teaspoon of salt in 8 ounces of water or  teaspoon of baking soda in 8 ounces of water. This should be done at least after each meal and at bedtime.  If you have mouth sores, avoid mouthwash that has alcohol. Also avoid alcohol and smoking because they can bother your mouth and throat.  Drink plenty of fluids (a minimum of eight glasses per day is recommended).  Take your temperature as your doctor or nurse tells you, and whenever you feel like you may have a fever.  Talk to your doctor or nurse about precautions you can take to avoid infections and bleeding.  Be careful when cooking, walking, and handling sharp objects and hot liquids.  Food and Drug Interactions  There are no known interactions of paclitaxel with food.   This drug may interact with other medicines. Tell your doctor and pharmacist about all the medicines and dietary supplements (vitamins, minerals, herbs and others) that you are taking at this time.  The safety and use of dietary supplements and alternative diets are often not known. Using these might affect your cancer or interfere with your treatment. Until more is known, you should not use dietary supplements or alternative diets without your cancer doctor's help.  When to Call the Doctor Call your doctor or nurse if you have any of the following symptoms and/or any new or unusual symptoms:  Fever of 100.5 F (38 C) or above  Chills  Redness, pain, warmth, or swelling at the IV site during the infusion  Signs of allergic reaction: swelling of the face, feeling like your tongue or throat are swelling, trouble breathing, rash, itching, fever, chills, feeling dizzy, and/or feeling that your heart is beating in a fast or not normal way  Feeling that your  heart is beating in a fast or not normal way (palpitations)  Weight gain of 5 pounds in one week (fluid retention)  Decreased urine or very dark urine  Signs of liver problems: dark urine, pale bowel movements, bad stomach pain, feeling very tired and weak, unusual itching, or yellowing of the eyes or skin  Heavy menstrual period that lasts longer than normal  Easy bruising or bleeding  Nausea that stops you from eating or drinking, and/or that is not relieved by prescribed medicines.  Loose bowel movements (diarrhea) more than 4 times a day or diarrhea with weakness or lightheadedness  Pain in your mouth or throat that makes it hard to eat or drink  Lasting loss of appetite or rapid weight loss of five pounds in a week  Signs of peripheral neuropathy: numbness, tingling, or decreased feeling in fingers or toes; trouble walking or changes in the way you walk; or feeling clumsy when buttoning clothes, opening jars, or other routine  activities  Joint and muscle pain that is not relieved by prescribed medicines  Extreme fatigue that interferes with normal activities  While you are getting this drug, please tell your nurse right away if you have any pain, redness, or swelling at the site of the IV infusion.  If you think you are pregnant.  Reproduction Warnings  Pregnancy warning: This drug may have harmful effects on the unborn child, it is recommended that effective methods of birth control should be used during your cancer treatment. Let your doctor know right away if you think you may be pregnant.  Breast feeding warning: Women should not breast feed during treatment because this drug could enter the breastmilk and cause harm to a breast feeding baby.  Trastuzumab-xxxx (Herceptin, Kanjinti)  About This Drug Trastuzumab-xxxx is used to treat cancer. It is given in the vein (IV)  Possible Side Effects  Bone marrow suppression. This is a decrease in the number of white blood cells, red blood cells, and platelets. This may raise your risk of infection, make you tired and weak (fatigue), and raise your risk of bleeding.  Congestive heart failure - your heart has less ability to pump blood properly  Soreness of the mouth and throat. You may have red areas, white patches, or sores that hurt  Nausea  Diarrhea (loose bowel movements)  Fever  Chills  Tiredness  Infection  Inflammation of nasal passages and throat  Changes in the way food and drinks taste  Weight loss  Headache  Trouble sleeping  Cough  Upper respiratory infection  Rash  Note: Each of the side effects above was reported in 10% or greater of patients treated with trastuzumab-xxxx. Not all possible side effects are included above.  Warnings and Precautions  Changes in the tissue of the heart and heart function. Some changes may happen that can cause your heart to have less ability to pump blood. This drug may also increase  your risk of heart attack.  Serious and life-threatening lung problems such as inflammation (swelling) and scarring of the lungs which makes breathing difficult.  While you are getting this drug in your vein (IV), you may have a reaction to the drug. Sometimes you may be given medication to stop or lessen these side effects. Your nurse will check you closely for these signs: fever or shaking chills, flushing, facial swelling, feeling dizzy, headache, trouble breathing, rash, itching, chest tightness, or chest pain. These reactions may happen after your infusion. If this happens, call 911 for  emergency care.  Severe decrease in the number of white blood cells. This may raise your risk of infection which may be life-threatening.  Note: Some of the side effects above are very rare. If you have concerns and/or questions, please discuss them with your medical team.  Important Information  This drug may be present in the saliva, tears, sweat, urine, stool, vomit, semen, and vaginal secretions. Talk to your doctor and/or your nurse about the necessary precautions to take during this time.  Treating Side Effects  Manage tiredness by pacing your activities for the day.  Be sure to include periods of rest between energy-draining activities.  To decrease the risk of infection, wash your hands regularly.  Avoid close contact with people who have a cold, the flu, or other infections.  Take your temperature as your doctor or nurse tells you, and whenever you feel like you may have a fever.  To help decrease the risk of bleeding, use a soft toothbrush. Check with your nurse before using dental floss.  Be very careful when using knives or tools.  Use an electric shaver instead of a razor.  Drink plenty of fluids (a minimum of eight glasses per day is recommended).  To help with nausea, eat small, frequent meals instead of three large meals a day. Choose foods and drinks that are at room  temperature. Ask your nurse or doctor about other helpful tips and medicine that is available to help stop or lessen these symptoms.  Mouth care is very important. Your mouth care should consist of routine, gentle cleaning of your teeth or dentures and rinsing your mouth with a mixture of 1/2 teaspoon of salt in 8 ounces of water or 1/2 teaspoon of baking soda in 8 ounces of water. This should be done at least after each meal and at bedtime.  If you have mouth sores, avoid mouthwash that has alcohol. Also avoid alcohol and smoking because they can bother your mouth and throat.  If you throw up or have loose bowel movements, you should drink more fluids so that you do not become dehydrated (lack of water in the body from losing too much fluid).  If you have diarrhea, eat low-fiber foods that are high in protein and calories and avoid foods that can irritate your digestive tracts or lead to cramping.  Ask your nurse or doctor about medicine that can lessen or stop your diarrhea.  To help with weight loss, drink fluids that contribute calories (whole milk, juice, soft drinks, sweetened beverages, milkshakes, and nutritional supplements) instead of water.  Include a source of protein at every meal and snack, such as meat, poultry, fish, dry beans, tofu, eggs, nuts, milk, yogurt, cheese, ice cream, pudding, and nutritional supplements.  If you get a rash do not put anything on it unless your doctor or nurse says you may. Keep the area around the rash clean and dry. Ask your doctor for medicine if your rash bothers you.  Keeping your pain under control is important to your well-being. Please tell your doctor or nurse if you are experiencing pain.  If you are having trouble sleeping, talk to your nurse or doctor on tips to help you sleep better  Infusion reactions may occur after your infusion. If this happens, call 911 for emergency care.  Food and Drug Interactions  There are no known  interactions of trastuzumab-xxxx with food.  This drug may interact with other medicines. Tell your doctor and pharmacist about all the prescription  and over-the-counter medicines and dietary supplements (vitamins, minerals, herbs and others) that you are taking at this time. Also, check with your doctor or pharmacist before starting any new prescription or over-the-counter medicines, or dietary supplements to make sure that there are no interactions.  When to Call the Doctor Call your doctor or nurse if you have any of these symptoms and/or any new or unusual symptoms:  Fever of 100.4 F (38 C) or higher  Chills  Tiredness that interferes with your daily activities  Trouble falling or staying asleep  Feeling dizzy or lightheaded  A headache that does not go away  Easy bleeding or bruising  Wheezing or trouble breathing or dry cough  Coughing up yellow, green, or bloody mucus  Feeling that your heart is beating in a fast or not normal way (palpitations)  Chest pain or symptoms of a heart attack. Most heart attacks involve pain in the center of the chest that lasts more than a few minutes. The pain may go away and come back, or it can be constant. It can feel like pressure, squeezing, fullness, or pain. Sometimes pain is felt in one or both arms, the back, neck, jaw, or stomach. If any of these symptoms last 2 minutes, call 911.  Pain in your mouth or throat that makes it hard to eat or drink  Nausea that stops you from eating or drinking and/or is not relieved by prescribed medicines  Diarrhea, 4 times in one day or diarrhea with lack of strength or a feeling of being dizzy  Lasting loss of appetite or rapid weight loss of five pounds in a week  Swelling of arms, hand, legs, and/or feet  SELF CARE ACTIVITIES WHILE ON CHEMOTHERAPY:  Hydration Increase your fluid intake 48 hours prior to treatment and drink at least 8 to 12 cups (64 ounces) of water/decaffeinated  beverages per day after treatment. You can still have your cup of coffee or soda but these beverages do not count as part of your 8 to 12 cups that you need to drink daily. No alcohol intake.  Medications Continue taking your normal prescription medication as prescribed.  If you start any new herbal or new supplements please let us know first to make sure it is safe.  Mouth Care Have teeth cleaned professionally before starting treatment. Keep dentures and partial plates clean. Use soft toothbrush and do not use mouthwashes that contain alcohol. Biotene is a good mouthwash that is available at most pharmacies or may be ordered by calling [phone]. Use warm salt water gargles (1 teaspoon salt per 1 quart warm water) before and after meals and at bedtime. Or you may rinse with 2 tablespoons of three-percent hydrogen peroxide mixed in eight ounces of water. If you are still having problems with your mouth or sores in your mouth please call the clinic. If you need dental work, please let the doctor know before you go for your appointment so that we can coordinate the best possible time for you in regards to your chemo regimen. You need to also let your dentist know that you are actively taking chemo. We may need to do labs prior to your dental appointment.  Skin Care Always use sunscreen that has not expired and with SPF (Sun Protection Factor) of 50 or higher. Wear hats to protect your head from the sun. Remember to use sunscreen on your hands, ears, face, & feet.  Use good moisturizing lotions such as udder cream, eucerin, or even  Vaseline. Some chemotherapies can cause dry skin, color changes in your skin and nails.     Avoid long, hot showers or baths.  Use gentle, fragrance-free soaps and laundry detergent.  Use moisturizers, preferably creams or ointments rather than lotions because the thicker consistency is better at preventing skin dehydration. Apply the cream or ointment within 15  minutes of showering. Reapply moisturizer at night, and moisturize your hands every time after you wash them.  Hair Loss (if your doctor says your hair will fall out)   If your doctor says that your hair is likely to fall out, decide before you begin chemo whether you want to wear a wig. You may want to shop before treatment to match your hair color.  Hats, turbans, and scarves can also camouflage hair loss, although some people prefer to leave their heads uncovered. If you go bare-headed outdoors, be sure to use sunscreen on your scalp.  Cut your hair short. It eases the inconvenience of shedding lots of hair, but it also can reduce the emotional impact of watching your hair fall out.  Don't perm or color your hair during chemotherapy. Those chemical treatments are already damaging to hair and can enhance hair loss. Once your chemo treatments are done and your hair has grown back, it's OK to resume dyeing or perming hair. With chemotherapy, hair loss is almost always temporary. But when it grows back, it may be a different color or texture. In older adults who still had hair color before chemotherapy, the new growth may be completely gray.  Often, new hair is very fine and soft.  Infection Prevention Please wash your hands for at least 30 seconds using warm soapy water. Handwashing is the #1 way to prevent the spread of germs. Stay away from sick people or people who are getting over a cold. If you develop respiratory systems such as green/yellow mucus production or productive cough or persistent cough let us know and we will see if you need an antibiotic. It is a good idea to keep a pair of gloves on when going into grocery stores/[employer] to decrease your risk of coming into contact with germs on the carts, etc. Carry alcohol hand gel with you at all times and use it frequently if out in public. If your temperature reaches 100.5 or higher please call the clinic and let us know.  If it is after hours  or on the weekend please go to the ER if your temperature is over 100.5.  Please have your own personal thermometer at home to use.    Sex and bodily fluids If you are going to have sex, a condom must be used to protect the person that isn't taking chemotherapy. Chemo can decrease your libido (sex drive). For a few days after chemotherapy, chemotherapy can be excreted through your bodily fluids.  When using the toilet please close the lid and flush the toilet twice.  Do this for a few day after you have had chemotherapy.   Effects of chemotherapy on your sex life Some changes are simple and won't last long. They won't affect your sex life permanently. Sometimes you may feel:  too tired  not strong enough to be very active  sick or sore   not in the mood  anxious or low Your anxiety might not seem related to sex. For example, you may be worried about the cancer and how your treatment is going. Or you may be worried about money, or about how  you family are coping with your illness. These things can cause stress, which can affect your interest in sex. It's important to talk to your partner about how you feel. Remember - the changes to your sex life don't usually last long. There's usually no medical reason to stop having sex during chemo. The drugs won't have any long term physical effects on your performance or enjoyment of sex. Cancer can't be passed on to your partner during sex  Contraception It's important to use reliable contraception during treatment. Avoid getting pregnant while you or your partner are having chemotherapy. This is because the drugs may harm the baby. Sometimes chemotherapy drugs can leave a man or woman infertile.  This means you would not be able to have children in the future. You might want to talk to someone about permanent infertility. It can be very difficult to learn that you may no longer be able to have children. Some people find counselling helpful. There might  be ways to preserve your fertility, although this is easier for men than for women. You may want to speak to a fertility expert. You can talk about sperm banking or harvesting your eggs. You can also ask about other fertility options, such as donor eggs. If you have or have had breast cancer, your doctor might advise you not to take the contraceptive pill. This is because the hormones in it might affect the cancer.  It is not known for sure whether or not chemotherapy drugs can be passed on through semen or secretions from the vagina. Because of this some doctors advise people to use a barrier method if you have sex during treatment. This applies to vaginal, anal or oral sex. Generally, doctors advise a barrier method only for the time you are actually having the treatment and for about a week after your treatment. Advice like this can be worrying, but this does not mean that you have to avoid being intimate with your partner. You can still have close contact with your partner and continue to enjoy sex.  Animals If you have cats or birds we just ask that you not change the litter or change the cage.  Please have someone else do this for you while you are on chemotherapy.   Food Safety During and After Cancer Treatment Food safety is important for people both during and after cancer treatment. Cancer and cancer treatments, such as chemotherapy, radiation therapy, and stem cell/bone marrow transplantation, often weaken the immune system. This makes it harder for your body to protect itself from foodborne illness, also called food poisoning. Foodborne illness is caused by eating food that contains harmful bacteria, parasites, or viruses.  Foods to avoid Some foods have a higher risk of becoming tainted with bacteria. These include:  Unwashed fresh fruit and vegetables, especially leafy vegetables that can hide dirt and other contaminants  Raw sprouts, such as alfalfa sprouts  Raw or undercooked beef,  especially ground beef, or other raw or undercooked meat and poultry  Fatty, fried, or spicy foods immediately before or after treatment.  These can sit heavy on your stomach and make you feel nauseous.  Raw or undercooked shellfish, such as oysters.  Sushi and sashimi, which often contain raw fish.   Unpasteurized beverages, such as unpasteurized fruit juices, raw milk, raw yogurt, or cider  Undercooked eggs, such as soft boiled, over easy, and poached; raw, unpasteurized eggs; or foods made with raw egg, such as homemade raw cookie dough and homemade mayonnaise  Simple steps for food safety Shop smart.  Do not buy food stored or displayed in an unclean area.  Do not buy bruised or damaged fruits or vegetables.  Do not buy cans that have cracks, dents, or bulges.  Pick up foods that can spoil at the end of your shopping trip and store them in a cooler on the way home. Prepare and clean up foods carefully.  Rinse all fresh fruits and vegetables under running water, and dry them with a clean towel or paper towel.  Clean the top of cans before opening them.  After preparing food, wash your hands for 20 seconds with hot water and soap. Pay special attention to areas between fingers and under nails.  Clean your utensils and dishes with hot water and soap.  Disinfect your kitchen and cutting boards using 1 teaspoon of liquid, unscented bleach mixed into 1 quart of water.   Dispose of old food.  Eat canned and packaged food before its expiration date (the use by or best before date).  Consume refrigerated leftovers within 3 to 4 days. After that time, throw out the food. Even if the food does not smell or look spoiled, it still may be unsafe. Some bacteria, such as Listeria, can grow even on foods stored in the refrigerator if they are kept for too long. Take precautions when eating out.  At restaurants, avoid buffets and salad bars where food sits out for a long time and comes in  contact with many people. Food can become contaminated when someone with a virus, often a norovirus, or another bug handles it.  Put any leftover food in a to-go container yourself, rather than having the server do it. And, refrigerate leftovers as soon as you get home.  Choose restaurants that are clean and that are willing to prepare your food as you order it cooked.   MEDICATIONS:                                                                                                                                                                Compazine/Prochlorperazine 10mg  tablet. Take 1 tablet every 6 hours as needed for nausea/vomiting. (This can make you sleepy)   EMLA cream. Apply a quarter size amount to port site 1 hour prior to chemo. Do not rub in. Cover with plastic wrap.   Over-the-Counter Meds:  Colace - 100 mg capsules - take 2 capsules daily.  If this doesn't help then you can increase to 2 capsules twice daily.  Call us if this does not help your bowels move.   Imodium 2mg  capsule. Take 2 capsules after the 1st loose stool and then 1 capsule every 2 hours until you go a total of 12 hours without having a loose stool. Call  the [facility] if loose stools continue. If diarrhea occurs at bedtime, take 2 capsules at bedtime. Then take 2 capsules every 4 hours until morning. Call [facility].    Diarrhea Sheet   If you are having loose stools/diarrhea, please purchase Imodium and begin taking as outlined:  At the first sign of poorly formed or loose stools you should begin taking Imodium (loperamide) 2 mg capsules.  Take two caplets (4mg ) followed by one caplet (2mg ) every 2 hours until you have had no diarrhea for 12 hours.  During the night take two caplets (4mg ) at bedtime and continue every 4 hours during the night until the morning.  Stop taking Imodium only after there is no sign of diarrhea for 12 hours.    Always call the [facility] if you are having loose  stools/diarrhea that you can't get under control.  Loose stools/diarrhea leads to dehydration (loss of water) in your body.  We have other options of trying to get the loose stools/diarrhea to stop but you must let us know!   Constipation Sheet  Colace - 100 mg capsules - take 2 capsules daily.  If this doesn't help then you can increase to 2 capsules twice daily.  Please call if the above does not work for you.   Do not go more than 2 days without a bowel movement.  It is very important that you do not become constipated.  It will make you feel sick to your stomach (nausea) and can cause abdominal pain and vomiting.   Nausea Sheet   Compazine/Prochlorperazine 10mg  tablet. Take 1 tablet every 6 hours as needed for nausea/vomiting. (This can make you sleepy)  If you are having persistent nausea (nausea that does not stop) please call the [facility] and let us know the amount of nausea that you are experiencing.  If you begin to vomit, you need to call the [facility] and if it is the weekend and you have vomited more than one time and can't get it to stop-go to the Emergency Room.  Persistent nausea/vomiting can lead to dehydration (loss of fluid in your body) and will make you feel terrible.   Ice chips, sips of clear liquids, foods that are @ room temperature, crackers, and toast tend to be better tolerated.   SYMPTOMS TO REPORT AS SOON AS POSSIBLE AFTER TREATMENT:   FEVER GREATER THAN 100.5 F  CHILLS WITH OR WITHOUT FEVER  NAUSEA AND VOMITING THAT IS NOT CONTROLLED WITH YOUR NAUSEA MEDICATION  UNUSUAL SHORTNESS OF BREATH  UNUSUAL BRUISING OR BLEEDING  TENDERNESS IN MOUTH AND THROAT WITH OR WITHOUT PRESENCE OF ULCERS  URINARY PROBLEMS  BOWEL PROBLEMS  UNUSUAL RASH      Wear comfortable clothing and clothing appropriate for easy access to any Portacath or PICC line. Let us know if there is anything that we can do to make your therapy better!    What to do if you  need assistance after hours or on the weekends: CALL [phone].  HOLD on the line, do not hang up.  You will hear multiple messages but at the end you will be connected with a nurse triage line.  They will contact the doctor if necessary.  Most of the time they will be able to assist you.  Do not call the hospital operator.      I have been informed and understand all of the instructions given to me and have received a copy. I have been instructed to call  the clinic [phone] or my family physician as soon as possible for continued medical care, if indicated. I do not have any more questions at this time but understand that I may call the [facility] or the Patient Navigator at [phone] during office hours should I have questions or need assistance in obtaining follow-up care.

## 2018-07-30 ENCOUNTER — Ambulatory Visit (HOSPITAL_COMMUNITY): Payer: Medicare HMO

## 2018-07-30 ENCOUNTER — Other Ambulatory Visit (HOSPITAL_COMMUNITY): Payer: Medicare HMO

## 2018-07-31 ENCOUNTER — Inpatient Hospital Stay (HOSPITAL_COMMUNITY): Payer: Medicare HMO

## 2018-07-31 ENCOUNTER — Inpatient Hospital Stay (HOSPITAL_COMMUNITY): Payer: Medicare HMO | Attending: Hematology

## 2018-07-31 ENCOUNTER — Encounter (HOSPITAL_COMMUNITY): Payer: Self-pay

## 2018-07-31 ENCOUNTER — Other Ambulatory Visit (HOSPITAL_COMMUNITY): Payer: Self-pay | Admitting: *Deleted

## 2018-07-31 ENCOUNTER — Other Ambulatory Visit: Payer: Self-pay

## 2018-07-31 VITALS — BP 166/78 | HR 73 | Temp 98.5°F | Resp 18 | Wt 183.0 lb

## 2018-07-31 DIAGNOSIS — Z7982 Long term (current) use of aspirin: Secondary | ICD-10-CM | POA: Insufficient documentation

## 2018-07-31 DIAGNOSIS — R197 Diarrhea, unspecified: Secondary | ICD-10-CM | POA: Insufficient documentation

## 2018-07-31 DIAGNOSIS — E079 Disorder of thyroid, unspecified: Secondary | ICD-10-CM | POA: Diagnosis not present

## 2018-07-31 DIAGNOSIS — G629 Polyneuropathy, unspecified: Secondary | ICD-10-CM | POA: Diagnosis not present

## 2018-07-31 DIAGNOSIS — E785 Hyperlipidemia, unspecified: Secondary | ICD-10-CM | POA: Insufficient documentation

## 2018-07-31 DIAGNOSIS — N183 Chronic kidney disease, stage 3 (moderate): Secondary | ICD-10-CM | POA: Diagnosis not present

## 2018-07-31 DIAGNOSIS — R5383 Other fatigue: Secondary | ICD-10-CM | POA: Diagnosis not present

## 2018-07-31 DIAGNOSIS — K219 Gastro-esophageal reflux disease without esophagitis: Secondary | ICD-10-CM | POA: Diagnosis not present

## 2018-07-31 DIAGNOSIS — I129 Hypertensive chronic kidney disease with stage 1 through stage 4 chronic kidney disease, or unspecified chronic kidney disease: Secondary | ICD-10-CM | POA: Insufficient documentation

## 2018-07-31 DIAGNOSIS — Z5111 Encounter for antineoplastic chemotherapy: Secondary | ICD-10-CM | POA: Insufficient documentation

## 2018-07-31 DIAGNOSIS — Z79899 Other long term (current) drug therapy: Secondary | ICD-10-CM | POA: Diagnosis not present

## 2018-07-31 DIAGNOSIS — C50411 Malignant neoplasm of upper-outer quadrant of right female breast: Secondary | ICD-10-CM | POA: Diagnosis not present

## 2018-07-31 DIAGNOSIS — D509 Iron deficiency anemia, unspecified: Secondary | ICD-10-CM | POA: Diagnosis not present

## 2018-07-31 DIAGNOSIS — Z8701 Personal history of pneumonia (recurrent): Secondary | ICD-10-CM | POA: Diagnosis not present

## 2018-07-31 DIAGNOSIS — Z171 Estrogen receptor negative status [ER-]: Principal | ICD-10-CM

## 2018-07-31 LAB — CBC WITH DIFFERENTIAL/PLATELET
BASOS ABS: 0 10*3/uL (ref 0.0–0.1)
Basophils Relative: 0 %
Eosinophils Absolute: 0.5 10*3/uL (ref 0.0–0.7)
Eosinophils Relative: 5 %
HEMATOCRIT: 33.9 % — AB (ref 36.0–46.0)
HEMOGLOBIN: 11.4 g/dL — AB (ref 12.0–15.0)
LYMPHS ABS: 2.9 10*3/uL (ref 0.7–4.0)
Lymphocytes Relative: 29 %
MCH: 29.3 pg (ref 26.0–34.0)
MCHC: 33.6 g/dL (ref 30.0–36.0)
MCV: 87.1 fL (ref 78.0–100.0)
Monocytes Absolute: 1.1 10*3/uL — ABNORMAL HIGH (ref 0.1–1.0)
Monocytes Relative: 11 %
NEUTROS ABS: 5.5 10*3/uL (ref 1.7–7.7)
Neutrophils Relative %: 55 %
Platelets: 427 10*3/uL — ABNORMAL HIGH (ref 150–400)
RBC: 3.89 MIL/uL (ref 3.87–5.11)
RDW: 15.6 % — ABNORMAL HIGH (ref 11.5–15.5)
WBC: 9.9 10*3/uL (ref 4.0–10.5)

## 2018-07-31 LAB — LIPID PANEL
Cholesterol: 257 mg/dL — ABNORMAL HIGH (ref 0–200)
HDL: 49 mg/dL (ref >40)
LDL Cholesterol: 162 mg/dL — ABNORMAL HIGH (ref 0–99)
Total CHOL/HDL Ratio: 5.2 RATIO
Triglycerides: 230 mg/dL — ABNORMAL HIGH (ref <150)
VLDL: 46 mg/dL — ABNORMAL HIGH (ref 0–40)

## 2018-07-31 LAB — COMPREHENSIVE METABOLIC PANEL
ALK PHOS: 70 U/L (ref 38–126)
ALT: 28 U/L (ref 0–44)
AST: 26 U/L (ref 15–41)
Albumin: 3.7 g/dL (ref 3.5–5.0)
Anion gap: 8 (ref 5–15)
BILIRUBIN TOTAL: 0.7 mg/dL (ref 0.3–1.2)
BUN: 23 mg/dL (ref 8–23)
CALCIUM: 9.2 mg/dL (ref 8.9–10.3)
CO2: 26 mmol/L (ref 22–32)
CREATININE: 1.21 mg/dL — AB (ref 0.44–1.00)
Chloride: 100 mmol/L (ref 98–111)
GFR calc Af Amer: 46 mL/min — ABNORMAL LOW (ref >60)
GFR, EST NON AFRICAN AMERICAN: 39 mL/min — AB (ref >60)
Glucose, Bld: 103 mg/dL — ABNORMAL HIGH (ref 70–99)
Potassium: 4.1 mmol/L (ref 3.5–5.1)
Sodium: 134 mmol/L — ABNORMAL LOW (ref 135–145)
Total Protein: 6.9 g/dL (ref 6.5–8.1)

## 2018-07-31 MED ORDER — DIPHENHYDRAMINE HCL 50 MG/ML IJ SOLN
50.0000 mg | Freq: Once | INTRAMUSCULAR | Status: AC
  Administered 2018-07-31: 50 mg via INTRAVENOUS
  Filled 2018-07-31: qty 1

## 2018-07-31 MED ORDER — TRASTUZUMAB CHEMO 150 MG IV SOLR
4.0000 mg/kg | Freq: Once | INTRAVENOUS | Status: AC
  Administered 2018-07-31: 336 mg via INTRAVENOUS
  Filled 2018-07-31: qty 16

## 2018-07-31 MED ORDER — SODIUM CHLORIDE 0.9 % IV SOLN
20.0000 mg | Freq: Once | INTRAVENOUS | Status: AC
  Administered 2018-07-31: 20 mg via INTRAVENOUS
  Filled 2018-07-31: qty 2

## 2018-07-31 MED ORDER — HEPARIN SOD (PORK) LOCK FLUSH 100 UNIT/ML IV SOLN
500.0000 [IU] | Freq: Once | INTRAVENOUS | Status: AC | PRN
  Administered 2018-07-31: 500 [IU]
  Filled 2018-07-31: qty 5

## 2018-07-31 MED ORDER — FAMOTIDINE IN NACL 20-0.9 MG/50ML-% IV SOLN
20.0000 mg | Freq: Once | INTRAVENOUS | Status: AC
  Administered 2018-07-31: 20 mg via INTRAVENOUS
  Filled 2018-07-31: qty 50

## 2018-07-31 MED ORDER — ACETAMINOPHEN 325 MG PO TABS
650.0000 mg | ORAL_TABLET | Freq: Once | ORAL | Status: AC
  Administered 2018-07-31: 650 mg via ORAL
  Filled 2018-07-31: qty 2

## 2018-07-31 MED ORDER — SODIUM CHLORIDE 0.9 % IV SOLN
Freq: Once | INTRAVENOUS | Status: AC
  Administered 2018-07-31: 10:00:00 via INTRAVENOUS

## 2018-07-31 MED ORDER — SODIUM CHLORIDE 0.9 % IV SOLN
40.0000 mg/m2 | Freq: Once | INTRAVENOUS | Status: AC
  Administered 2018-07-31: 78 mg via INTRAVENOUS
  Filled 2018-07-31: qty 13

## 2018-07-31 NOTE — Progress Notes (Signed)
Verbal orders received from Dr. Josue Hector office to draw hgb A1C, lipid panel and vitamin D level.  These orders have been placed and lab is aware that they need to add on the test.  Patient was fasting this morning for labs.  I will fax results to his office at [phone].

## 2018-07-31 NOTE — Progress Notes (Signed)
Tolerated infusions w/o adverse reaction.  Alert, in no distress.  VSS.  Discharged ambulatory in c/o family.  

## 2018-08-01 LAB — VITAMIN D 25 HYDROXY (VIT D DEFICIENCY, FRACTURES): VIT D 25 HYDROXY: 29.6 ng/mL — AB (ref 30.0–100.0)

## 2018-08-01 LAB — HEMOGLOBIN A1C
HEMOGLOBIN A1C: 5.9 % — AB (ref 4.8–5.6)
Mean Plasma Glucose: 123 mg/dL

## 2018-08-03 ENCOUNTER — Telehealth (HOSPITAL_COMMUNITY): Payer: Self-pay | Admitting: *Deleted

## 2018-08-03 DIAGNOSIS — E559 Vitamin D deficiency, unspecified: Secondary | ICD-10-CM | POA: Diagnosis not present

## 2018-08-03 DIAGNOSIS — Z23 Encounter for immunization: Secondary | ICD-10-CM | POA: Diagnosis not present

## 2018-08-03 DIAGNOSIS — R7301 Impaired fasting glucose: Secondary | ICD-10-CM | POA: Diagnosis not present

## 2018-08-03 DIAGNOSIS — I129 Hypertensive chronic kidney disease with stage 1 through stage 4 chronic kidney disease, or unspecified chronic kidney disease: Secondary | ICD-10-CM | POA: Diagnosis not present

## 2018-08-03 DIAGNOSIS — Z6830 Body mass index (BMI) 30.0-30.9, adult: Secondary | ICD-10-CM | POA: Diagnosis not present

## 2018-08-03 DIAGNOSIS — C50911 Malignant neoplasm of unspecified site of right female breast: Secondary | ICD-10-CM | POA: Diagnosis not present

## 2018-08-03 DIAGNOSIS — N183 Chronic kidney disease, stage 3 (moderate): Secondary | ICD-10-CM | POA: Diagnosis not present

## 2018-08-03 DIAGNOSIS — E782 Mixed hyperlipidemia: Secondary | ICD-10-CM | POA: Diagnosis not present

## 2018-08-03 NOTE — Telephone Encounter (Signed)
Contacted for 24 hour f/u after first Taxol/Herceptin infusion on Friday, 07/31/18.  Home phone line continues to be busy.  Voicemail message left on pt's cell phone to contact the clinic with any questions or concerns.

## 2018-08-06 ENCOUNTER — Other Ambulatory Visit (HOSPITAL_COMMUNITY): Payer: Medicare HMO

## 2018-08-06 ENCOUNTER — Ambulatory Visit (HOSPITAL_COMMUNITY): Payer: Medicare HMO | Admitting: Hematology

## 2018-08-06 ENCOUNTER — Ambulatory Visit (HOSPITAL_COMMUNITY): Payer: Medicare HMO

## 2018-08-07 ENCOUNTER — Inpatient Hospital Stay (HOSPITAL_COMMUNITY): Payer: Medicare HMO

## 2018-08-07 ENCOUNTER — Encounter (HOSPITAL_COMMUNITY): Payer: Self-pay | Admitting: Hematology

## 2018-08-07 ENCOUNTER — Inpatient Hospital Stay (HOSPITAL_COMMUNITY): Payer: Medicare HMO | Admitting: Hematology

## 2018-08-07 VITALS — BP 151/63 | HR 95 | Temp 98.3°F | Resp 18 | Wt 184.6 lb

## 2018-08-07 VITALS — BP 147/65 | HR 78 | Temp 98.3°F | Resp 18

## 2018-08-07 DIAGNOSIS — R5383 Other fatigue: Secondary | ICD-10-CM | POA: Diagnosis not present

## 2018-08-07 DIAGNOSIS — R197 Diarrhea, unspecified: Secondary | ICD-10-CM | POA: Diagnosis not present

## 2018-08-07 DIAGNOSIS — Z5111 Encounter for antineoplastic chemotherapy: Secondary | ICD-10-CM

## 2018-08-07 DIAGNOSIS — Z79899 Other long term (current) drug therapy: Secondary | ICD-10-CM

## 2018-08-07 DIAGNOSIS — C50411 Malignant neoplasm of upper-outer quadrant of right female breast: Secondary | ICD-10-CM

## 2018-08-07 DIAGNOSIS — I129 Hypertensive chronic kidney disease with stage 1 through stage 4 chronic kidney disease, or unspecified chronic kidney disease: Secondary | ICD-10-CM | POA: Diagnosis not present

## 2018-08-07 DIAGNOSIS — K219 Gastro-esophageal reflux disease without esophagitis: Secondary | ICD-10-CM

## 2018-08-07 DIAGNOSIS — E785 Hyperlipidemia, unspecified: Secondary | ICD-10-CM | POA: Diagnosis not present

## 2018-08-07 DIAGNOSIS — N183 Chronic kidney disease, stage 3 (moderate): Secondary | ICD-10-CM | POA: Diagnosis not present

## 2018-08-07 DIAGNOSIS — Z171 Estrogen receptor negative status [ER-]: Principal | ICD-10-CM

## 2018-08-07 DIAGNOSIS — Z8701 Personal history of pneumonia (recurrent): Secondary | ICD-10-CM | POA: Diagnosis not present

## 2018-08-07 DIAGNOSIS — G629 Polyneuropathy, unspecified: Secondary | ICD-10-CM | POA: Diagnosis not present

## 2018-08-07 DIAGNOSIS — E079 Disorder of thyroid, unspecified: Secondary | ICD-10-CM | POA: Diagnosis not present

## 2018-08-07 DIAGNOSIS — Z7982 Long term (current) use of aspirin: Secondary | ICD-10-CM

## 2018-08-07 DIAGNOSIS — D509 Iron deficiency anemia, unspecified: Secondary | ICD-10-CM | POA: Diagnosis not present

## 2018-08-07 LAB — COMPREHENSIVE METABOLIC PANEL
ALK PHOS: 85 U/L (ref 38–126)
ALT: 32 U/L (ref 0–44)
AST: 26 U/L (ref 15–41)
Albumin: 3.5 g/dL (ref 3.5–5.0)
Anion gap: 9 (ref 5–15)
BILIRUBIN TOTAL: 0.3 mg/dL (ref 0.3–1.2)
BUN: 22 mg/dL (ref 8–23)
CALCIUM: 9 mg/dL (ref 8.9–10.3)
CO2: 24 mmol/L (ref 22–32)
CREATININE: 1.17 mg/dL — AB (ref 0.44–1.00)
Chloride: 100 mmol/L (ref 98–111)
GFR calc Af Amer: 47 mL/min — ABNORMAL LOW (ref >60)
GFR, EST NON AFRICAN AMERICAN: 41 mL/min — AB (ref >60)
GLUCOSE: 118 mg/dL — AB (ref 70–99)
Potassium: 4.8 mmol/L (ref 3.5–5.1)
Sodium: 133 mmol/L — ABNORMAL LOW (ref 135–145)
TOTAL PROTEIN: 7.1 g/dL (ref 6.5–8.1)

## 2018-08-07 LAB — CBC WITH DIFFERENTIAL/PLATELET
Abs Immature Granulocytes: 0.05 10*3/uL (ref 0.00–0.07)
Basophils Absolute: 0.1 10*3/uL (ref 0.0–0.1)
Basophils Relative: 1 %
EOS ABS: 0.4 10*3/uL (ref 0.0–0.5)
EOS PCT: 5 %
HEMATOCRIT: 33.6 % — AB (ref 36.0–46.0)
HEMOGLOBIN: 11 g/dL — AB (ref 12.0–15.0)
IMMATURE GRANULOCYTES: 1 %
LYMPHS ABS: 2 10*3/uL (ref 0.7–4.0)
LYMPHS PCT: 27 %
MCH: 29.6 pg (ref 26.0–34.0)
MCHC: 32.7 g/dL (ref 30.0–36.0)
MCV: 90.3 fL (ref 80.0–100.0)
MONOS PCT: 9 %
Monocytes Absolute: 0.7 10*3/uL (ref 0.1–1.0)
NRBC: 0 % (ref 0.0–0.2)
Neutro Abs: 4.3 10*3/uL (ref 1.7–7.7)
Neutrophils Relative %: 57 %
Platelets: 410 10*3/uL — ABNORMAL HIGH (ref 150–400)
RBC: 3.72 MIL/uL — ABNORMAL LOW (ref 3.87–5.11)
RDW: 15.5 % (ref 11.5–15.5)
WBC: 7.5 10*3/uL (ref 4.0–10.5)

## 2018-08-07 MED ORDER — DIPHENHYDRAMINE HCL 50 MG/ML IJ SOLN
50.0000 mg | Freq: Once | INTRAMUSCULAR | Status: AC
  Administered 2018-08-07: 50 mg via INTRAVENOUS
  Filled 2018-08-07: qty 1

## 2018-08-07 MED ORDER — HEPARIN SOD (PORK) LOCK FLUSH 100 UNIT/ML IV SOLN
500.0000 [IU] | Freq: Once | INTRAVENOUS | Status: AC | PRN
  Administered 2018-08-07: 500 [IU]
  Filled 2018-08-07: qty 5

## 2018-08-07 MED ORDER — SODIUM CHLORIDE 0.9 % IV SOLN
20.0000 mg | Freq: Once | INTRAVENOUS | Status: AC
  Administered 2018-08-07: 20 mg via INTRAVENOUS
  Filled 2018-08-07: qty 2

## 2018-08-07 MED ORDER — SODIUM CHLORIDE 0.9 % IV SOLN
Freq: Once | INTRAVENOUS | Status: AC
  Administered 2018-08-07: 09:00:00 via INTRAVENOUS

## 2018-08-07 MED ORDER — SODIUM CHLORIDE 0.9% FLUSH
10.0000 mL | INTRAVENOUS | Status: DC | PRN
  Administered 2018-08-07: 10 mL
  Filled 2018-08-07: qty 10

## 2018-08-07 MED ORDER — FAMOTIDINE IN NACL 20-0.9 MG/50ML-% IV SOLN
20.0000 mg | Freq: Once | INTRAVENOUS | Status: AC
  Administered 2018-08-07: 20 mg via INTRAVENOUS
  Filled 2018-08-07: qty 50

## 2018-08-07 MED ORDER — SODIUM CHLORIDE 0.9 % IV SOLN
53.3333 mg/m2 | Freq: Once | INTRAVENOUS | Status: AC
  Administered 2018-08-07: 102 mg via INTRAVENOUS
  Filled 2018-08-07: qty 17

## 2018-08-07 MED ORDER — ACETAMINOPHEN 325 MG PO TABS
650.0000 mg | ORAL_TABLET | Freq: Once | ORAL | Status: AC
  Administered 2018-08-07: 650 mg via ORAL
  Filled 2018-08-07: qty 2

## 2018-08-07 MED ORDER — TRASTUZUMAB CHEMO 150 MG IV SOLR
2.0000 mg/kg | Freq: Once | INTRAVENOUS | Status: AC
  Administered 2018-08-07: 168 mg via INTRAVENOUS
  Filled 2018-08-07: qty 8

## 2018-08-07 NOTE — Progress Notes (Signed)
[id number] Labs reviewed with and pt seen by Dr. [name] and pt approved for chemo tx today per MD                                                                      [name] tolerated chemo tx well without complaints or incident. VSS upon discharge. Pt discharged self ambulatory in satisfactory condition accompanied by family members

## 2018-08-07 NOTE — Assessment & Plan Note (Signed)
1.  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 - Right breast biopsy at 11:30 position on 07/03/2018 shows invasive ductal carcinoma, ER/PR negative, HER-2 positive, Ki-67 of 40%, right axillary lymph node biopsy shows benign tissue, left breast core needle biopsy at 230 o'clock position shows complex sclerosing lesion - CT chest abdomen and pelvis on 07/16/2018 showed a 6 mm nodule in the posterior left lower lobe with no other evidence of metastatic disease. -Bone scan on 07/16/2018 shows focal area of increased activity over the distal left clavicle/shoulder consistent with degenerative changes. -She was evaluated by Dr.'s WDonne Hazeland GLindi Adieand was recommended neoadjuvant Herceptin therapy for at least 6 months followed by surgical resection. - Skin punch biopsy of the right breast on 07/28/2018 was positive for dermal lymphatic invasion. -Echo on 07/27/2018 shows ejection fraction of 55 to 60%. - She was started on weekly paclitaxel and Herceptin on 07/31/2018.  She tolerated it very well. -I have reviewed her blood counts today, which are adequate to proceed with week 2 of treatment.  I will increase paclitaxel to 66.7% of the dose. -She will come back for follow-up in 1 week and for next treatment.

## 2018-08-07 NOTE — Patient Instructions (Signed)
[facility] at [facility] Discharge Instructions     Thank you for choosing [facility] at [facility] to provide your oncology and hematology care.  To afford each patient quality time with our provider, please arrive at least 15 minutes before your scheduled appointment time.   If you have a lab appointment with the Cancer Center please come in thru the  Main Entrance and check in at the main information desk  You need to re-schedule your appointment should you arrive 10 or more minutes late.  We strive to give you quality time with our providers, and arriving late affects you and other patients whose appointments are after yours.  Also, if you no show three or more times for appointments you may be dismissed from the clinic at the providers discretion.     Again, thank you for choosing [facility].  Our hope is that these requests will decrease the amount of time that you wait before being seen by our physicians.       _____________________________________________________________  Should you have questions after your visit to [facility], please contact our office at [phone] between the hours of 8:00 a.m. and 4:30 p.m.  Voicemails left after 4:00 p.m. will not be returned until the following business day.  For prescription refill requests, have your pharmacy contact our office and allow 72 hours.    Cancer Center Support Programs:   > Cancer Support Group  2nd Tuesday of the month 1pm-2pm, Journey Room    

## 2018-08-07 NOTE — Progress Notes (Signed)
[place], [place] [address]   CLINIC:  Medical Oncology/Hematology  PCP:  [name], MD [address] [place] [address] [phone]   REASON FOR VISIT: Follow-up for right breast cancer, ER-/ PR-/HER2+  CURRENT THERAPY: Paclitaxel and Herceptin   BRIEF ONCOLOGIC HISTORY:    Malignant neoplasm of upper-outer quadrant of right breast in female, estrogen receptor negative ([facility])   07/03/2018 Initial Diagnosis    Palpable right breast mass for several months with skin discoloration, clinically skin trabecular thickening with nipple retraction measuring 7.2 x 4.3 x 4.2 cm, additional mass 4 o'clock position 2.6 cm, right axillary tail 1.2 cm right axillary lymph node 1.8 cm left breast irregular mass 1 cm, adjacent mass 0.9 cm together measuring 1.9 cm, no lymphadenopathy    07/03/2018 Pathology Results    Right breast biopsy: IDC grade 2, ER 0%, PR 0%, HER-2 positive, Ki-67 40%, lymph node biopsy negative, left breast biopsy complex sclerosing lesion     07/15/2018 Cancer Staging    Staging form: Breast, AJCC 8th Edition - Clinical: Stage IIB (cT3, cN0, cM0, G2, ER-, PR-, HER2+) - Signed by [name], MD on 07/15/2018    07/29/2018 -  Chemotherapy    The patient had trastuzumab (HERCEPTIN) 336 mg in sodium chloride 0.9 % 250 mL chemo infusion, 4 mg/kg = 336 mg, Intravenous,  Once, 1 of 3 cycles Administration: 336 mg (07/31/2018), 168 mg (08/07/2018) PACLitaxel (TAXOL) 78 mg in sodium chloride 0.9 % 250 mL chemo infusion (</= 68m/m2), 40 mg/m2 = 78 mg (50 % of original dose 80 mg/m2), Intravenous,  Once, 1 of 3 cycles Dose modification: 40 mg/m2 (50 % of original dose 80 mg/m2, Cycle 1, Reason: Patient Age), 53.3333 mg/m2 (66.7 % of original dose 80 mg/m2, Cycle 1, Reason: Provider Judgment) Administration: 78 mg (07/31/2018), 102 mg (08/07/2018)  for chemotherapy treatment.       CANCER STAGING: Cancer Staging Malignant neoplasm  of upper-outer quadrant of right breast in female, estrogen receptor negative ([facility] Staging form: Breast, AJCC 8th Edition - Clinical: Stage IIB (cT3, cN0, cM0, G2, ER-, PR-, HER2+) - Signed by GNicholas Lose MD on 07/15/2018    INTERVAL HISTORY:  Ms. [id number]. female returns for routine follow-up for right breast cancer. Patient is here today with her husband. She tolerated her last treatment well. She had diarrhea for 2 days and was relieved with medication. She was slightly more fatigued but no other complaints at this time. She denies any skin rashes or mouth sores. Denies any nausea or vomiting. Denies any new pains or lumps. She reprots her appetite at 100% and she has no problem maintaining her weight. Her energy level is 50%.    REVIEW OF SYSTEMS:  Review of Systems  Constitutional: Positive for fatigue.  Gastrointestinal: Positive for diarrhea.  All other systems reviewed and are negative.    PAST MEDICAL/SURGICAL HISTORY:  Past Medical History:  Diagnosis Date  . Cancer ([place] 06/2018   right breast cancer  . GERD (gastroesophageal reflux disease)    occasional   . Hyperlipidemia   . Hypertension    clearance with note Dr HN[place]ada Craneon chart  . Pneumonia    2 years ago/ states occ cough with sinus drainage- no fever  . Thyroid nodule    with biopsy- states following medically   Past Surgical History:  Procedure Laterality Date  . CATARACT EXTRACTION W/PHACO  11/30/2012   Procedure: CATARACT EXTRACTION  PHACO AND INTRAOCULAR LENS PLACEMENT (IOC);  Surgeon: Williams Che, MD;  Location: [facility];  Service: Ophthalmology;  Laterality: Left;  CDE:11.49  . CATARACT EXTRACTION W/PHACO Right 03/15/2013   Procedure: CATARACT EXTRACTION PHACO AND INTRAOCULAR LENS PLACEMENT (IOC);  Surgeon: Williams Che, MD;  Location: [facility];  Service: Ophthalmology;  Laterality: Right;  CDE 16.15  . COLONOSCOPY    . EXCISION OF SKIN TAG Right 06/17/2016   Procedure: SHAVE OF SKIN TAG RIGHT  BUTTOCK;  Surgeon: Aviva Signs, MD;  Location: [facility];  Service: General;  Laterality: Right;  . JOINT REPLACEMENT     left knee  . MASS EXCISION Left 06/17/2016   Procedure: EXCISION SKIN MALIGNANT LESION LEFT BUTTOCK;  Surgeon: Aviva Signs, MD;  Location: [facility];  Service: General;  Laterality: Left;  . PORTACATH PLACEMENT Right 07/28/2018   Procedure: INSERTION PORT-A-CATH WITH ULTRASOUND;  Surgeon: Rolm Bookbinder, MD;  Location: [facility];  Service: General;  Laterality: Right;  . PUNCH BIOPSY OF SKIN Right 07/28/2018   Procedure: PUNCH BIOPSY OF SKIN RIGHT BREAST;  Surgeon: Rolm Bookbinder, MD;  Location: [facility];  Service: General;  Laterality: Right;  . TOTAL KNEE ARTHROPLASTY  12/16/2011   Procedure: TOTAL KNEE ARTHROPLASTY;  Surgeon: Gearlean Alf, MD;  Location: [facility];  Service: Orthopedics;  Laterality: Right;  . TUBAL LIGATION       SOCIAL HISTORY:  Social History   Socioeconomic History  . Marital status: Married    Spouse name: Not on file  . Number of children: 2  . Years of education: some business school after HS  . Highest education level: Not on file  Occupational History  . Occupation: Cabin crew  . Occupation: Network engineer   Social Needs  . Financial resource strain: Not hard at all  . Food insecurity:    Worry: Never true    Inability: Never true  . Transportation needs:    Medical: No    Non-medical: No  Tobacco Use  . Smoking status: Never Smoker  . Smokeless tobacco: Never Used  Substance and Sexual Activity  . Alcohol use: No    Frequency: Never  . Drug use: No  . Sexual activity: Yes    Birth control/protection: None  Lifestyle  . Physical activity:    Days per week: 0 days    Minutes per session: 0 min  . Stress: Not at all  Relationships  . Social connections:    Talks on phone: More than three times a week    Gets together: More than three times a week    Attends religious service: More than 4 times  per year    Active member of club or organization: Yes    Attends meetings of clubs or organizations: Never    Relationship status: Married  . Intimate partner violence:    Fear of current or ex partner: No    Emotionally abused: No    Physically abused: No    Forced sexual activity: No  Other Topics Concern  . Not on file  Social History Narrative   Lives at home with her husband.   4 cups caffeine per day.   Right-handed.    FAMILY HISTORY:  Family History  Problem Relation Age of Onset  . Heart attack Mother   . Bronchitis Father   . Diabetes Sister   . Leukemia Brother   . Lung disease Brother   . Lung disease Sister     CURRENT  MEDICATIONS:  Outpatient Encounter Medications as of 08/07/2018  Medication Sig  . simvastatin (ZOCOR) 10 MG tablet Take 10 mg by mouth daily.  Marland Kitchen acetaminophen (TYLENOL) 325 MG tablet Take 650 mg by mouth every 6 (six) hours as needed.  Marland Kitchen aspirin 325 MG tablet Take 162.5 mg by mouth daily.   . cetirizine (ZYRTEC) 10 MG tablet Take 10 mg by mouth daily.  . diphenhydrAMINE (BENADRYL) 25 MG tablet Take 25 mg by mouth at bedtime as needed.  . lidocaine-prilocaine (EMLA) cream Apply to port site one hour prior to appointment and cover with plastic wrap.  . losartan (COZAAR) 100 MG tablet Take 100 mg by mouth daily.   . Multiple Vitamin (MULTIVITAMIN WITH MINERALS) TABS tablet Take 1 tablet by mouth daily.  . Omega-3 Fatty Acids (FISH OIL PO) Take 2 capsules by mouth daily.   Marland Kitchen PACLitaxel (TAXOL IV) Inject into the vein.  . pantoprazole (PROTONIX) 40 MG tablet Take 40 mg by mouth daily.  . prochlorperazine (COMPAZINE) 10 MG tablet Take 1 tablet (10 mg total) by mouth every 6 (six) hours as needed (Nausea or vomiting).  . traMADol (ULTRAM) 50 MG tablet Take 1 tablet (50 mg total) by mouth every 6 (six) hours as needed.  . Trastuzumab (HERCEPTIN IV) Inject into the vein.   No facility-administered encounter medications on file as of 08/07/2018.      ALLERGIES:  Allergies  Allergen Reactions  . Penicillins Rash    Has patient had a PCN reaction causing immediate rash, facial/tongue/throat swelling, SOB or lightheadedness with hypotension: No Has patient had a PCN reaction causing severe rash involving mucus membranes or skin necrosis: No Has patient had a PCN reaction that required hospitalization: No Has patient had a PCN reaction occurring within the last 10 years: No If all of the above answers are "NO", then may proceed with Cephalosporin use.      PHYSICAL EXAM:  ECOG Performance status: 1  Vitals:   08/07/18 0851  BP: (!) 151/63  Pulse: 95  Resp: 18  Temp: 98.3 F (36.8 C)  SpO2: 99%   Filed Weights   08/07/18 0851  Weight: 184 lb 9.6 oz (83.7 kg)    Physical Exam  Constitutional: She is oriented to person, place, and time. She appears well-developed and well-nourished.  Cardiovascular: Normal rate, regular rhythm and normal heart sounds.  Pulmonary/Chest: Effort normal and breath sounds normal.  Musculoskeletal: Normal range of motion.  Neurological: She is alert and oriented to person, place, and time.  Skin: Skin is warm and dry.  Psychiatric: She has a normal mood and affect. Her behavior is normal. Judgment and thought content normal.     LABORATORY DATA:  I have reviewed the labs as listed.  CBC    Component Value Date/Time   WBC 7.5 08/07/2018 0818   RBC 3.72 (L) 08/07/2018 0818   HGB 11.0 (L) 08/07/2018 0818   HGB 12.7 11/24/2017 1626   HCT 33.6 (L) 08/07/2018 0818   HCT 36.6 11/24/2017 1626   PLT 410 (H) 08/07/2018 0818   PLT 420 (H) 11/24/2017 1626   MCV 90.3 08/07/2018 0818   MCV 88 11/24/2017 1626   MCH 29.6 08/07/2018 0818   MCHC 32.7 08/07/2018 0818   RDW 15.5 08/07/2018 0818   RDW 15.4 11/24/2017 1626   LYMPHSABS 2.0 08/07/2018 0818   LYMPHSABS 2.5 11/24/2017 1626   MONOABS 0.7 08/07/2018 0818   EOSABS 0.4 08/07/2018 0818   EOSABS 0.1 11/24/2017 1626   BASOSABS  0.1  08/07/2018 0818   BASOSABS 0.0 11/24/2017 1626   CMP Latest Ref Rng & Units 08/07/2018 07/31/2018 07/16/2018  Glucose 70 - 99 mg/dL 118(H) 103(H) -  BUN 8 - 23 mg/dL 22 23 -  Creatinine 0.44 - 1.00 mg/dL 1.17(H) 1.21(H) 1.00  Sodium 135 - 145 mmol/L 133(L) 134(L) -  Potassium 3.5 - 5.1 mmol/L 4.8 4.1 -  Chloride 98 - 111 mmol/L 100 100 -  CO2 22 - 32 mmol/L 24 26 -  Calcium 8.9 - 10.3 mg/dL 9.0 9.2 -  Total Protein 6.5 - 8.1 g/dL 7.1 6.9 -  Total Bilirubin 0.3 - 1.2 mg/dL 0.3 0.7 -  Alkaline Phos 38 - 126 U/L 85 70 -  AST 15 - 41 U/L 26 26 -  ALT 0 - 44 U/L 32 28 -        ASSESSMENT & PLAN:   Malignant neoplasm of upper-outer quadrant of right breast in female, estrogen receptor negative ([facility]) 1.  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 - Right breast biopsy at 11:30 position on 07/03/2018 shows invasive ductal carcinoma, ER/PR negative, HER-2 positive, Ki-67 of 40%, right axillary lymph node biopsy shows benign tissue, left breast core needle biopsy at 230 o'clock position shows complex sclerosing lesion - CT chest abdomen and pelvis on 07/16/2018 showed a 6 mm nodule in the posterior left lower lobe with no other evidence of metastatic disease. -Bone scan on 07/16/2018 shows focal area of increased activity over the distal left clavicle/shoulder consistent with degenerative changes. -She was evaluated by Dr.'s WDonne Hazeland GLindi Adieand was recommended neoadjuvant Herceptin therapy for at least 6 months followed by surgical resection. - Skin punch biopsy of the  right breast on 07/28/2018 was positive for dermal lymphatic invasion. -Echo on 07/27/2018 shows ejection fraction of 55 to 60%. - She was started on weekly paclitaxel and Herceptin on 07/31/2018.  She tolerated it very well. -I have reviewed her blood counts today, which are adequate to proceed with week 2 of treatment.  I will increase paclitaxel to 66.7% of the dose. -She will come back for follow-up in 1 week and for next treatment.      Orders placed this encounter:  Orders Placed This Encounter  Procedures  . CBC with Differential/Platelet  . Comprehensive metabolic panel      SDerek Jack MD [phone]

## 2018-08-07 NOTE — Patient Instructions (Signed)
[facility] Discharge Instructions for Patients Receiving Chemotherapy   Beginning January 23rd 2017 lab work for the Cancer Center will be done in the  Main lab at [place] on 1st floor. If you have a lab appointment with the Cancer Center please come in thru the  Main Entrance and check in at the main information desk   Today you received the following chemotherapy agents Taxol and Herceptin. Follow-up as scheduled. Call clinic for any questions or concerns  To help prevent nausea and vomiting after your treatment, we encourage you to take your nausea medication   If you develop nausea and vomiting, or diarrhea that is not controlled by your medication, call the clinic.  The clinic phone number is [phone]. Office hours are Monday-Friday 8:30am-5:00pm.  BELOW ARE SYMPTOMS THAT SHOULD BE REPORTED IMMEDIATELY:  *FEVER GREATER THAN 101.0 F  *CHILLS WITH OR WITHOUT FEVER  NAUSEA AND VOMITING THAT IS NOT CONTROLLED WITH YOUR NAUSEA MEDICATION  *UNUSUAL SHORTNESS OF BREATH  *UNUSUAL BRUISING OR BLEEDING  TENDERNESS IN MOUTH AND THROAT WITH OR WITHOUT PRESENCE OF ULCERS  *URINARY PROBLEMS  *BOWEL PROBLEMS  UNUSUAL RASH Items with * indicate a potential emergency and should be followed up as soon as possible. If you have an emergency after office hours please contact your primary care physician or go to the nearest emergency department.  Please call the clinic during office hours if you have any questions or concerns.   You may also contact the Patient Navigator at [phone] should you have any questions or need assistance in obtaining follow up care.      Resources For Cancer Patients and their Caregivers ? [employer]: Can assist with transportation, wigs, general needs, runs Look Good Feel Better.        1-888-227-6333 ? Cancer Care: Provides financial assistance, online support groups, medication/co-pay assistance.   1-800-813-HOPE (4673) ? [facility] Assists [place] cancer patients and their families through emotional , educational and financial support.  336-427-4357 ? [place] DSS Where to apply for food stamps, Medicaid and utility assistance. 336-342-1394 ? [facility]: Transportation to medical appointments. 336-347-2287 ? Social Security Administration: May apply for disability if have a Stage IV cancer. 336-342-7796 1-800-772-1213 ? [place] [facility]: Assists with nutrition, care and transit needs. 336-349-2343         

## 2018-08-13 ENCOUNTER — Ambulatory Visit (HOSPITAL_COMMUNITY): Payer: Medicare HMO

## 2018-08-13 ENCOUNTER — Other Ambulatory Visit (HOSPITAL_COMMUNITY): Payer: Medicare HMO

## 2018-08-14 ENCOUNTER — Inpatient Hospital Stay (HOSPITAL_COMMUNITY): Payer: Medicare HMO

## 2018-08-14 ENCOUNTER — Encounter (HOSPITAL_COMMUNITY): Payer: Self-pay

## 2018-08-14 ENCOUNTER — Inpatient Hospital Stay (HOSPITAL_COMMUNITY): Payer: Medicare HMO | Admitting: Oncology

## 2018-08-14 ENCOUNTER — Other Ambulatory Visit: Payer: Self-pay

## 2018-08-14 VITALS — BP 147/72 | HR 78 | Temp 97.8°F | Resp 18

## 2018-08-14 VITALS — BP 156/73 | HR 94 | Temp 98.2°F | Resp 18 | Wt 181.2 lb

## 2018-08-14 DIAGNOSIS — K219 Gastro-esophageal reflux disease without esophagitis: Secondary | ICD-10-CM | POA: Diagnosis not present

## 2018-08-14 DIAGNOSIS — R5383 Other fatigue: Secondary | ICD-10-CM | POA: Diagnosis not present

## 2018-08-14 DIAGNOSIS — C50411 Malignant neoplasm of upper-outer quadrant of right female breast: Secondary | ICD-10-CM

## 2018-08-14 DIAGNOSIS — N183 Chronic kidney disease, stage 3 unspecified: Secondary | ICD-10-CM

## 2018-08-14 DIAGNOSIS — Z7982 Long term (current) use of aspirin: Secondary | ICD-10-CM

## 2018-08-14 DIAGNOSIS — I129 Hypertensive chronic kidney disease with stage 1 through stage 4 chronic kidney disease, or unspecified chronic kidney disease: Secondary | ICD-10-CM

## 2018-08-14 DIAGNOSIS — Z5111 Encounter for antineoplastic chemotherapy: Secondary | ICD-10-CM | POA: Diagnosis not present

## 2018-08-14 DIAGNOSIS — E079 Disorder of thyroid, unspecified: Secondary | ICD-10-CM

## 2018-08-14 DIAGNOSIS — R197 Diarrhea, unspecified: Secondary | ICD-10-CM

## 2018-08-14 DIAGNOSIS — Z79899 Other long term (current) drug therapy: Secondary | ICD-10-CM

## 2018-08-14 DIAGNOSIS — Z171 Estrogen receptor negative status [ER-]: Secondary | ICD-10-CM | POA: Diagnosis not present

## 2018-08-14 DIAGNOSIS — D509 Iron deficiency anemia, unspecified: Secondary | ICD-10-CM | POA: Diagnosis not present

## 2018-08-14 DIAGNOSIS — G629 Polyneuropathy, unspecified: Secondary | ICD-10-CM | POA: Diagnosis not present

## 2018-08-14 DIAGNOSIS — Z8701 Personal history of pneumonia (recurrent): Secondary | ICD-10-CM

## 2018-08-14 DIAGNOSIS — E785 Hyperlipidemia, unspecified: Secondary | ICD-10-CM

## 2018-08-14 DIAGNOSIS — D649 Anemia, unspecified: Secondary | ICD-10-CM

## 2018-08-14 LAB — CBC WITH DIFFERENTIAL/PLATELET
Abs Immature Granulocytes: 0.04 10*3/uL (ref 0.00–0.07)
BASOS ABS: 0 10*3/uL (ref 0.0–0.1)
Basophils Relative: 1 %
Eosinophils Absolute: 0.2 10*3/uL (ref 0.0–0.5)
Eosinophils Relative: 3 %
HCT: 33.2 % — ABNORMAL LOW (ref 36.0–46.0)
HEMOGLOBIN: 10.7 g/dL — AB (ref 12.0–15.0)
IMMATURE GRANULOCYTES: 1 %
LYMPHS ABS: 2.1 10*3/uL (ref 0.7–4.0)
LYMPHS PCT: 30 %
MCH: 28.7 pg (ref 26.0–34.0)
MCHC: 32.2 g/dL (ref 30.0–36.0)
MCV: 89 fL (ref 80.0–100.0)
MONOS PCT: 9 %
Monocytes Absolute: 0.6 10*3/uL (ref 0.1–1.0)
NEUTROS PCT: 56 %
NRBC: 0 % (ref 0.0–0.2)
Neutro Abs: 3.9 10*3/uL (ref 1.7–7.7)
Platelets: 419 10*3/uL — ABNORMAL HIGH (ref 150–400)
RBC: 3.73 MIL/uL — ABNORMAL LOW (ref 3.87–5.11)
RDW: 15.2 % (ref 11.5–15.5)
WBC: 7 10*3/uL (ref 4.0–10.5)

## 2018-08-14 LAB — COMPREHENSIVE METABOLIC PANEL
ALBUMIN: 3.5 g/dL (ref 3.5–5.0)
ALK PHOS: 79 U/L (ref 38–126)
ALT: 27 U/L (ref 0–44)
AST: 24 U/L (ref 15–41)
Anion gap: 8 (ref 5–15)
BILIRUBIN TOTAL: 0.5 mg/dL (ref 0.3–1.2)
BUN: 19 mg/dL (ref 8–23)
CO2: 26 mmol/L (ref 22–32)
CREATININE: 1.13 mg/dL — AB (ref 0.44–1.00)
Calcium: 9.3 mg/dL (ref 8.9–10.3)
Chloride: 99 mmol/L (ref 98–111)
GFR calc Af Amer: 50 mL/min — ABNORMAL LOW (ref >60)
GFR, EST NON AFRICAN AMERICAN: 43 mL/min — AB (ref >60)
GLUCOSE: 116 mg/dL — AB (ref 70–99)
Potassium: 4.6 mmol/L (ref 3.5–5.1)
Sodium: 133 mmol/L — ABNORMAL LOW (ref 135–145)
TOTAL PROTEIN: 6.9 g/dL (ref 6.5–8.1)

## 2018-08-14 MED ORDER — HEPARIN SOD (PORK) LOCK FLUSH 100 UNIT/ML IV SOLN
500.0000 [IU] | Freq: Once | INTRAVENOUS | Status: AC | PRN
  Administered 2018-08-14: 500 [IU]

## 2018-08-14 MED ORDER — ACETAMINOPHEN 325 MG PO TABS
ORAL_TABLET | ORAL | Status: AC
  Filled 2018-08-14: qty 2

## 2018-08-14 MED ORDER — FAMOTIDINE IN NACL 20-0.9 MG/50ML-% IV SOLN
INTRAVENOUS | Status: AC
  Filled 2018-08-14: qty 50

## 2018-08-14 MED ORDER — SODIUM CHLORIDE 0.9 % IV SOLN
20.0000 mg | Freq: Once | INTRAVENOUS | Status: AC
  Administered 2018-08-14: 20 mg via INTRAVENOUS
  Filled 2018-08-14: qty 2

## 2018-08-14 MED ORDER — SODIUM CHLORIDE 0.9% FLUSH
10.0000 mL | INTRAVENOUS | Status: DC | PRN
  Administered 2018-08-14: 10 mL
  Filled 2018-08-14: qty 10

## 2018-08-14 MED ORDER — SODIUM CHLORIDE 0.9 % IV SOLN
53.3333 mg/m2 | Freq: Once | INTRAVENOUS | Status: AC
  Administered 2018-08-14: 102 mg via INTRAVENOUS
  Filled 2018-08-14: qty 17

## 2018-08-14 MED ORDER — ACETAMINOPHEN 325 MG PO TABS
650.0000 mg | ORAL_TABLET | Freq: Once | ORAL | Status: AC
  Administered 2018-08-14: 650 mg via ORAL

## 2018-08-14 MED ORDER — FAMOTIDINE IN NACL 20-0.9 MG/50ML-% IV SOLN
20.0000 mg | Freq: Once | INTRAVENOUS | Status: AC
  Administered 2018-08-14: 20 mg via INTRAVENOUS

## 2018-08-14 MED ORDER — DIPHENHYDRAMINE HCL 50 MG/ML IJ SOLN
INTRAMUSCULAR | Status: AC
  Filled 2018-08-14: qty 1

## 2018-08-14 MED ORDER — TRASTUZUMAB CHEMO 150 MG IV SOLR
2.0000 mg/kg | Freq: Once | INTRAVENOUS | Status: AC
  Administered 2018-08-14: 168 mg via INTRAVENOUS
  Filled 2018-08-14: qty 8

## 2018-08-14 MED ORDER — SODIUM CHLORIDE 0.9 % IV SOLN
Freq: Once | INTRAVENOUS | Status: AC
  Administered 2018-08-14: 10:00:00 via INTRAVENOUS

## 2018-08-14 MED ORDER — DIPHENHYDRAMINE HCL 50 MG/ML IJ SOLN
50.0000 mg | Freq: Once | INTRAMUSCULAR | Status: AC
  Administered 2018-08-14: 50 mg via INTRAVENOUS

## 2018-08-14 NOTE — Progress Notes (Signed)
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  Staging CT CAP and bone scan on 07/16/2018 demonstrated a 6 mm nodule in the posterior LLL without any frank metastatic disease.  Skin punch biopsy on 07/28/2018 demonstrated dermal lymphatic invasion.  Baseline 2D echo on 07/27/2018 demonstrated LVEF of 55- 60%.  She started neoadjuvant chemotherapy with weekly Paclitaxel + Herceptin on 07/31/2018.  CURRENT THERAPY: Paclitaxel + Herceptin  CURRENT STATUS: [name] 84 y.o. female returns for followup of inflammatory breast cancer involving the right breast.  She has tolerated treatment well.  Her blood counts today are very good.  She denies any new lumps or bumps on her own examination.  She admits to intermittent diarrhea that is well controlled with medication at home including Imodium.  She denies any peripheral neuropathy affecting her hands or feet at this time.  She denies any nailbed changes.  She denies any nausea or vomiting.  Her appetite is good and her weight is stable.  She denies any new pain.  She denies any new cough or hemoptysis.  She denies any new neurological deficits including headaches, dizziness, double vision, LOC, and seizure.  She admits that her right breast erythema is improving.  Review of Systems  Constitutional: Negative.  Negative for chills, fever and weight loss.  HENT: Negative.   Eyes: Negative.   Respiratory:  Negative.  Negative for cough.   Cardiovascular: Negative.  Negative for chest pain.  Gastrointestinal: Negative.  Negative for blood in stool, constipation, diarrhea, melena, nausea and vomiting.  Genitourinary: Negative.   Musculoskeletal: Negative.   Skin: Negative.   Neurological: Negative.  Negative for weakness.  Endo/Heme/Allergies: Negative.   Psychiatric/Behavioral: Negative.     Past Medical History:  Diagnosis Date  . Cancer ([facility]) 06/2018   right breast cancer  . GERD (gastroesophageal reflux disease)    occasional   . Hyperlipidemia   . Hypertension    clearance with note Dr Nevada Crane on chart  . Pneumonia    2 years ago/ states occ cough with sinus drainage- no fever  . Thyroid nodule    with biopsy- states following medically    Past Surgical History:  Procedure Laterality Date  . CATARACT EXTRACTION W/PHACO  11/30/2012   Procedure: CATARACT EXTRACTION PHACO AND INTRAOCULAR LENS PLACEMENT (IOC);  Surgeon: Williams Che, MD;  Location: [facility];  Service: Ophthalmology;  Laterality: Left;  CDE:11.49  . CATARACT EXTRACTION W/PHACO Right 03/15/2013   Procedure: CATARACT EXTRACTION PHACO AND INTRAOCULAR LENS PLACEMENT (IOC);  Surgeon: Williams Che, MD;  Location: [facility];  Service: Ophthalmology;  Laterality: Right;  CDE 16.15  . COLONOSCOPY    . EXCISION OF SKIN TAG Right 06/17/2016   Procedure: SHAVE OF SKIN TAG RIGHT BUTTOCK;  Surgeon: Aviva Signs, MD;  Location: [facility];  Service: General;  Laterality: Right;  . JOINT REPLACEMENT     left knee  . MASS EXCISION Left 06/17/2016   Procedure: EXCISION SKIN MALIGNANT LESION LEFT BUTTOCK;  Surgeon: Aviva Signs,  MD;  Location: [facility];  Service: General;  Laterality: Left;  . PORTACATH PLACEMENT Right 07/28/2018   Procedure: INSERTION PORT-A-CATH WITH ULTRASOUND;  Surgeon: Rolm Bookbinder, MD;  Location: [facility];  Service: General;  Laterality: Right;  . PUNCH BIOPSY OF SKIN Right 07/28/2018   Procedure:  PUNCH BIOPSY OF SKIN RIGHT BREAST;  Surgeon: Rolm Bookbinder, MD;  Location: [facility];  Service: General;  Laterality: Right;  . TOTAL KNEE ARTHROPLASTY  12/16/2011   Procedure: TOTAL KNEE ARTHROPLASTY;  Surgeon: Gearlean Alf, MD;  Location: [facility];  Service: Orthopedics;  Laterality: Right;  . TUBAL LIGATION      Family History  Problem Relation Age of Onset  . Heart attack Mother   . Bronchitis Father   . Diabetes Sister   . Leukemia Brother   . Lung disease Brother   . Lung disease Sister     Social History   Socioeconomic History  . Marital status: Married    Spouse name: Not on file  . Number of children: 2  . Years of education: some business school after HS  . Highest education level: Not on file  Occupational History  . Occupation: Cabin crew  . Occupation: Network engineer   Social Needs  . Financial resource strain: Not hard at all  . Food insecurity:    Worry: Never true    Inability: Never true  . Transportation needs:    Medical: No    Non-medical: No  Tobacco Use  . Smoking status: Never Smoker  . Smokeless tobacco: Never Used  Substance and Sexual Activity  . Alcohol use: No    Frequency: Never  . Drug use: No  . Sexual activity: Yes    Birth control/protection: None  Lifestyle  . Physical activity:    Days per week: 0 days    Minutes per session: 0 min  . Stress: Not at all  Relationships  . Social connections:    Talks on phone: More than three times a week    Gets together: More than three times a week    Attends religious service: More than 4 times per year    Active member of club or organization: Yes    Attends meetings of clubs or organizations: Never    Relationship status: Married  Other Topics Concern  . Not on file  Social History Narrative   Lives at home with her husband.   4 cups caffeine per day.   Right-handed.     PHYSICAL EXAMINATION  ECOG PERFORMANCE STATUS: 1 - Symptomatic but completely  ambulatory  Vitals:   08/14/18 0905  BP: (!) 156/73  Pulse: 94  Resp: 18  Temp: 98.2 F (36.8 C)  SpO2: 98%    GENERAL:alert, well nourished, well developed, comfortable, cooperative and smiling SKIN: skin color, texture, turgor are normal, no rashes or significant lesions, see breast exam below. HEAD: Normocephalic, No masses, lesions, tenderness or abnormalities EYES: normal, EOMI, Conjunctiva are pink and non-injected EARS: External ears normal OROPHARYNX:lips, buccal mucosa, and tongue normal and mucous membranes are moist  NECK: supple, trachea midline LYMPH:  not examined BREAST: Right breast examined and skin exam demonstrates decrease in erythema compared to picture noted on 07/27/2018 documentation. LUNGS: clear to auscultation and percussion HEART: regular rate & rhythm, no murmurs and no gallops ABDOMEN:abdomen soft, non-tender and normal bowel sounds BACK: Back symmetric, no curvature. EXTREMITIES:less then 2 second capillary refill, no joint deformities, effusion, or inflammation, no edema, no skin discoloration,  no clubbing, no cyanosis, compression stockings in place NEURO: alert & oriented x 3 with fluent speech, no focal motor/sensory deficits   LABORATORY DATA: CBC    Component Value Date/Time   WBC 7.0 08/14/2018 0844   RBC 3.73 (L) 08/14/2018 0844   HGB 10.7 (L) 08/14/2018 0844   HGB 12.7 11/24/2017 1626   HCT 33.2 (L) 08/14/2018 0844   HCT 36.6 11/24/2017 1626   PLT 419 (H) 08/14/2018 0844   PLT 420 (H) 11/24/2017 1626   MCV 89.0 08/14/2018 0844   MCV 88 11/24/2017 1626   MCH 28.7 08/14/2018 0844   MCHC 32.2 08/14/2018 0844   RDW 15.2 08/14/2018 0844   RDW 15.4 11/24/2017 1626   LYMPHSABS 2.1 08/14/2018 0844   LYMPHSABS 2.5 11/24/2017 1626   MONOABS 0.6 08/14/2018 0844   EOSABS 0.2 08/14/2018 0844   EOSABS 0.1 11/24/2017 1626   BASOSABS 0.0 08/14/2018 0844   BASOSABS 0.0 11/24/2017 1626      Chemistry      Component Value Date/Time   NA  133 (L) 08/14/2018 0844   K 4.6 08/14/2018 0844   CL 99 08/14/2018 0844   CO2 26 08/14/2018 0844   BUN 19 08/14/2018 0844   CREATININE 1.13 (H) 08/14/2018 0844      Component Value Date/Time   CALCIUM 9.3 08/14/2018 0844   ALKPHOS 79 08/14/2018 0844   AST 24 08/14/2018 0844   ALT 27 08/14/2018 0844   BILITOT 0.5 08/14/2018 0844       RADIOGRAPHIC STUDIES:  Ct Chest W Contrast  Result Date: 07/16/2018 CLINICAL DATA:  Palpable right breast mass for several months with skin discoloration, staging EXAM: CT CHEST, ABDOMEN, AND PELVIS WITH CONTRAST TECHNIQUE: Multidetector CT imaging of the chest, abdomen and pelvis was performed following the standard protocol during bolus administration of intravenous contrast. CONTRAST:  148m OMNIPAQUE IOHEXOL 300 MG/ML  SOLN COMPARISON:  CT abdomen pelvis of 12/04/2013 and chest x-ray 12/04/2013 FINDINGS: CT CHEST FINDINGS Cardiovascular: Moderate thoracic aortic atherosclerosis is present. There are coronary artery calcifications present diffusely. The heart is mildly enlarged. No pericardial effusion is noted. The mid ascending thoracic aorta measures 34 mm in diameter. Mediastinum/Nodes: Small mediastinal lymph nodes are present. No adenopathy is seen. There are small nodules within the thyroid gland and ultrasound of the thyroid could be performed to assess further if warranted clinically. There does appear to be a small hiatal hernia present. Lungs/Pleura: On lung window images, no pneumonia or pleural effusion is seen. The only nodule present is on image 111 series 3 within the medial aspect of the left lower lobe measuring 6 mm in diameter, of questionable significance in this age group. However due to the patient's diagnosis of breast carcinoma, follow-up CT chest may be warranted. The central airway is patent. Musculoskeletal: There are degenerative changes throughout the thoracic spine. No lytic or blastic lesion is seen. Degenerative changes are most  marked at the T3, 4, 5 level. A right breast mass is noted best seen on image 32 series 2 measuring 6.0 x 4.0 cm with skin thickening consistent with primary breast carcinoma. CT ABDOMEN PELVIS FINDINGS Hepatobiliary: The liver enhances and no focal hepatic abnormality is seen. Calcified debris or noncalcified gallstones layering in the neck of the gallbladder. Ultrasound of the right upper quadrant could be helpful if further assessment is necessary. Pancreas: The pancreas is normal in size and the pancreatic duct is not dilated. Spleen: The spleen is unremarkable. Adrenals/Urinary Tract: The adrenal glands appear normal.  The kidneys enhance and on delayed images, the pelvocaliceal systems are unremarkable. No hydronephrosis is seen. A small cyst emanates from the lower pole of the right kidney measuring 11 mm in diameter. The ureters appear normal in caliber. There is a calcification in the right bony pelvis on image 105 series 2 there appears to be very near the right ureter, but there is no change in caliber of the ureter which appears to course immediately lateral to this calcification, probably an adjacent phlebolith. The urinary bladder is not well distended and does contain a small amount air. This most likely is due to recent instrumentation but if instrumentation has not been performed then this may indicate a fistulous communication. Correlate clinically. Stomach/Bowel: The stomach is decompressed and difficult to evaluate. No abnormality of small bowel is seen. Multiple rectosigmoid colon diverticula are noted but no current diverticulitis is seen. There is some linear strandiness between the sigmoid colon and the posterior aspect of the urinary bladder and a fistulous communication cannot be excluded as questioned above. Some contrast is scattered throughout the nondistended colon. The terminal ileum is unremarkable and the appendix is not definitely visualized. No inflammatory process is seen within  the right lower quadrant. Vascular/Lymphatic: The abdominal aorta is normal in caliber with moderate abdominal aortic atherosclerosis present. The distal abdominal aorta measures 2.3 cm in diameter. No adenopathy is seen. Reproductive: The uterus is normal in size for age. No adnexal lesion is seen. Other: No abdominal wall hernia is seen. Musculoskeletal: There is some malalignment of the lumbar vertebrae most likely due to degenerative change of the facet joints. Degenerative disc disease is present particularly at L2-3 and L4-5 levels. IMPRESSION: 1. Single 6 mm nodule in the posterior left lower lobe of questionable significance in this age group. However, in view of the history of recent diagnosis of breast carcinoma, follow-up may be warranted to assess stability. 2. Soft tissue mass in the right breast with overlying skin thickening consistent with the patient's right breast carcinoma. 3. Some air in the urinary bladder could be due to recent instrumentation, but as noted above fistulous communication cannot be excluded with changes of diverticular disease within the sigmoid colon immediately posterior to the urinary bladder. 4. Moderate thoracic and abdominal aortic atherosclerosis. 5. Probable small hiatal hernia. Electronically Signed   By: Ivar Drape M.D.   On: 07/16/2018 13:52   Nm Bone Scan Whole Body  Result Date: 07/17/2018 CLINICAL DATA:  Breast cancer. EXAM: NUCLEAR MEDICINE WHOLE BODY BONE SCAN TECHNIQUE: Whole body anterior and posterior images were obtained approximately 3 hours after intravenous injection of radiopharmaceutical. RADIOPHARMACEUTICALS:  20.0 mCi Technetium-62mMDP IV COMPARISON:  CT's 07/16/2018.  Chest x-ray 12/05/2011. FINDINGS: Bilateral renal function excretion. Punctate area scratched it focal area of increased activity noted over the distal left clavicle/shoulder. These changes could be degenerative, left shoulder series can be obtained for further evaluation. Areas of  increased activity noted over both sternoclavicular joints, most likely degenerative. Subtle areas of increased activity noted throughout the spine. These are most likely degenerative. Degenerative changes noted on recent CT. Punctate area of increased activity noted about a left lower posterior rib medially may be related to costovertebral degenerative change. Bilateral knee replacements. IMPRESSION: 1. Focal area of increased activity noted over the distal left clavicle/left shoulder. These changes may be degenerative. Further evaluation can be obtained with a left shoulder series. Focal areas of increased activity noted over both sternoclavicular joints most likely degenerative. 2. Multifocal mild punctate areas of  increased activity noted throughout the spine most likely degenerative. Degenerative changes noted throughout the spine on prior recent CT's. Punctate area of increased activity noted over the medial posterior left lower rib may be related costovertebral degenerative change. 3.  Bilateral knee replacements. Electronically Signed   By: Marcello Moores  Register   On: 07/17/2018 07:55   Ct Abdomen Pelvis W Contrast  Result Date: 07/16/2018 CLINICAL DATA:  Palpable right breast mass for several months with skin discoloration, staging EXAM: CT CHEST, ABDOMEN, AND PELVIS WITH CONTRAST TECHNIQUE: Multidetector CT imaging of the chest, abdomen and pelvis was performed following the standard protocol during bolus administration of intravenous contrast. CONTRAST:  134m OMNIPAQUE IOHEXOL 300 MG/ML  SOLN COMPARISON:  CT abdomen pelvis of 12/04/2013 and chest x-ray 12/04/2013 FINDINGS: CT CHEST FINDINGS Cardiovascular: Moderate thoracic aortic atherosclerosis is present. There are coronary artery calcifications present diffusely. The heart is mildly enlarged. No pericardial effusion is noted. The mid ascending thoracic aorta measures 34 mm in diameter. Mediastinum/Nodes: Small mediastinal lymph nodes are present. No  adenopathy is seen. There are small nodules within the thyroid gland and ultrasound of the thyroid could be performed to assess further if warranted clinically. There does appear to be a small hiatal hernia present. Lungs/Pleura: On lung window images, no pneumonia or pleural effusion is seen. The only nodule present is on image 111 series 3 within the medial aspect of the left lower lobe measuring 6 mm in diameter, of questionable significance in this age group. However due to the patient's diagnosis of breast carcinoma, follow-up CT chest may be warranted. The central airway is patent. Musculoskeletal: There are degenerative changes throughout the thoracic spine. No lytic or blastic lesion is seen. Degenerative changes are most marked at the T3, 4, 5 level. A right breast mass is noted best seen on image 32 series 2 measuring 6.0 x 4.0 cm with skin thickening consistent with primary breast carcinoma. CT ABDOMEN PELVIS FINDINGS Hepatobiliary: The liver enhances and no focal hepatic abnormality is seen. Calcified debris or noncalcified gallstones layering in the neck of the gallbladder. Ultrasound of the right upper quadrant could be helpful if further assessment is necessary. Pancreas: The pancreas is normal in size and the pancreatic duct is not dilated. Spleen: The spleen is unremarkable. Adrenals/Urinary Tract: The adrenal glands appear normal. The kidneys enhance and on delayed images, the pelvocaliceal systems are unremarkable. No hydronephrosis is seen. A small cyst emanates from the lower pole of the right kidney measuring 11 mm in diameter. The ureters appear normal in caliber. There is a calcification in the right bony pelvis on image 105 series 2 there appears to be very near the right ureter, but there is no change in caliber of the ureter which appears to course immediately lateral to this calcification, probably an adjacent phlebolith. The urinary bladder is not well distended and does contain a small  amount air. This most likely is due to recent instrumentation but if instrumentation has not been performed then this may indicate a fistulous communication. Correlate clinically. Stomach/Bowel: The stomach is decompressed and difficult to evaluate. No abnormality of small bowel is seen. Multiple rectosigmoid colon diverticula are noted but no current diverticulitis is seen. There is some linear strandiness between the sigmoid colon and the posterior aspect of the urinary bladder and a fistulous communication cannot be excluded as questioned above. Some contrast is scattered throughout the nondistended colon. The terminal ileum is unremarkable and the appendix is not definitely visualized. No inflammatory process is seen  within the right lower quadrant. Vascular/Lymphatic: The abdominal aorta is normal in caliber with moderate abdominal aortic atherosclerosis present. The distal abdominal aorta measures 2.3 cm in diameter. No adenopathy is seen. Reproductive: The uterus is normal in size for age. No adnexal lesion is seen. Other: No abdominal wall hernia is seen. Musculoskeletal: There is some malalignment of the lumbar vertebrae most likely due to degenerative change of the facet joints. Degenerative disc disease is present particularly at L2-3 and L4-5 levels. IMPRESSION: 1. Single 6 mm nodule in the posterior left lower lobe of questionable significance in this age group. However, in view of the history of recent diagnosis of breast carcinoma, follow-up may be warranted to assess stability. 2. Soft tissue mass in the right breast with overlying skin thickening consistent with the patient's right breast carcinoma. 3. Some air in the urinary bladder could be due to recent instrumentation, but as noted above fistulous communication cannot be excluded with changes of diverticular disease within the sigmoid colon immediately posterior to the urinary bladder. 4. Moderate thoracic and abdominal aortic atherosclerosis.  5. Probable small hiatal hernia. Electronically Signed   By: Ivar Drape M.D.   On: 07/16/2018 13:52   Dg Chest Port 1 View  Result Date: 07/28/2018 CLINICAL DATA:  Port-A-Cath placement. EXAM: PORTABLE CHEST 1 VIEW COMPARISON:  CT 07/16/2018.  Chest x-ray 05/27/2018. FINDINGS: PowerPort catheter noted with tip over superior vena cava. Cardiomegaly with normal pulmonary vascularity. No focal infiltrate. Small left pleural effusion cannot be excluded. No pneumothorax. IMPRESSION: 1.  PowerPort catheter with tip over superior vena cava. 2.  Cardiomegaly with normal pulmonary vascularity. 2. No focal infiltrate. Small left pleural effusion cannot be excluded. Previously identified 6 mm pulmonary nodule in the left lung base on prior CT of 07/16/2018 best evaluated by CT. Electronically Signed   By: Marcello Moores  Register   On: 07/28/2018 09:44   Dg Fluoro Guide Cv Line-no Report  Result Date: 07/28/2018 Fluoroscopy was utilized by the requesting physician.  No radiographic interpretation.     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 Staging CT CAP and bone scan on 07/16/2018 demonstrated a 6 mm nodule in the posterior LLL without any frank metastatic disease. Skin punch biopsy on 07/28/2018 demonstrated dermal lymphatic invasion. Baseline 2D echo on 07/27/2018 demonstrated LVEF of 55- 60%. She started neoadjuvant chemotherapy with weekly Paclitaxel + Herceptin on 07/31/2018.  Labs today: CBC diff, CMET.  I personally reviewed and went over laboratory results with the patient.  The results are noted within this dictation.  Blood counts today demonstrate a white blood cell count of 7.0 with normal differential, hemoglobin 10.7 g/dL, and mild thrombocytosis of 419,000.  Given her mild  anemia and thrombocytosis, I will check iron studies with her next lab appointment.  I suspect her anemia is related to chemotherapy plus/minus anemia of chronic renal disease.  However, this does not explain her mild thrombocytosis and therefore I think it is prudent to rule out iron deficiency anemia.  Labs satisfy treatment parameters today.  Today is D15C1.  Nursing, in accordance with chemotherapy administration protocol, will monitor for acute side effects/toxicities associated with chemotherapy administration today.  Return in 1 week for follow-up and ongoing treatment.  At that time, we will consider increasing her dose of Paclitaxel.  2. Encounter for antineoplastic  chemotherapy Tolerating paclitaxel + Herceptin well.  No significant side effects associated with treatment at this time.  She will require repeat 2D echocardiogram in 09/2018  3. Peripheral polyneuropathy Stable without progression  4. Normocytic anemia Noted.  Likely secondary to chronic renal disease.  Will check iron studies with next lab appointment given her thrombocytosis.  5. Chronic renal insufficiency, stage III (moderate) (HCC) Stable.   ORDERS PLACED FOR THIS ENCOUNTER: Orders Placed This Encounter  Procedures  . CBC with Differential  . Comprehensive metabolic panel  . Iron and TIBC  . Ferritin    MEDICATIONS PRESCRIBED THIS ENCOUNTER: No orders of the defined types were placed in this encounter.   THERAPY PLAN:  Continue with neoadjuvant therapy followed by definitive surgical management and 1 year worth of Herceptin therapy.  All questions were answered. The patient knows to call the clinic with any problems, questions or concerns. We can certainly see the patient much sooner if necessary.  Patient and plan discussed with Dr. Derek Jack and she is in agreement with the aforementioned.   This note is electronically signed by: Robynn Pane, PA-C 08/14/2018 9:46 AM\

## 2018-08-14 NOTE — Assessment & Plan Note (Addendum)
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 Staging CT CAP and bone scan on 07/16/2018 demonstrated a 6 mm nodule in the posterior LLL without any frank metastatic disease. Skin punch biopsy on 07/28/2018 demonstrated dermal lymphatic invasion. Baseline 2D echo on 07/27/2018 demonstrated LVEF of 55- 60%. She started neoadjuvant chemotherapy with weekly Paclitaxel + Herceptin on 07/31/2018.  Labs today: CBC diff, CMET.  I personally reviewed and went over laboratory results with the patient.  The results are noted within this dictation.  Blood counts today demonstrate a white blood cell count of 7.0 with normal differential, hemoglobin 10.7 g/dL, and mild thrombocytosis of 419,000.  Given her mild anemia and thrombocytosis, I will check iron studies with her next lab appointment.  I suspect her anemia is related to chemotherapy plus/minus anemia of chronic renal disease.  However, this does not explain her mild thrombocytosis and therefore I think it is prudent to rule out iron deficiency anemia.  Labs satisfy treatment parameters today.  Today is D15C1.  Nursing, in accordance with chemotherapy administration protocol, will monitor for acute side effects/toxicities associated with chemotherapy administration today.  Return in 1 week for follow-up and ongoing treatment.  At that time, we will consider increasing her dose of Paclitaxel.

## 2018-08-14 NOTE — Patient Instructions (Signed)
[place] at [facility] Discharge Instructions  Laboratory work updated today.  Lab satisfy treatment parameters today. Treatment today as planned. Return in 1 week for laboratory work and next treatment.   Thank you for choosing [place] at [facility] to provide your oncology and hematology care.  To afford each patient quality time with our provider, please arrive at least 15 minutes before your scheduled appointment time.   If you have a lab appointment with the [facility] please come in thru the  Main Entrance and check in at the main information desk  You need to re-schedule your appointment should you arrive 10 or more minutes late.  We strive to give you quality time with our providers, and arriving late affects you and other patients whose appointments are after yours.  Also, if you no show three or more times for appointments you may be dismissed from the clinic at the providers discretion.     Again, thank you for choosing [facility]  Our hope is that these requests will decrease the amount of time that you wait before being seen by our physicians.       _____________________________________________________________  Should you have questions after your visit to [facility], please contact our office at [phone] between the hours of 8:00 a.m. and 4:30 p.m.  Voicemails left after 4:00 p.m. will not be returned until the following business day.  For prescription refill requests, have your pharmacy contact our office and allow 72 hours.    Cancer Center Support Programs:   > Cancer Support Group  2nd Tuesday of the month 1pm-2pm, Journey Room

## 2018-08-14 NOTE — Patient Instructions (Signed)
[facility] Discharge Instructions for Patients Receiving Chemotherapy   Beginning January 23rd 2017 lab work for the Cancer Center will be done in the  Main lab at [facility] on 1st floor. If you have a lab appointment with the Cancer Center please come in thru the  Main Entrance and check in at the main information desk   Today you received the following chemotherapy agents Taxol and Herceptin. Follow-up as scheduled. Call clinic for any questions or concerns  To help prevent nausea and vomiting after your treatment, we encourage you to take your nausea medication   If you develop nausea and vomiting, or diarrhea that is not controlled by your medication, call the clinic.  The clinic phone number is [phone]. Office hours are Monday-Friday 8:30am-5:00pm.  BELOW ARE SYMPTOMS THAT SHOULD BE REPORTED IMMEDIATELY:  *FEVER GREATER THAN 101.0 F  *CHILLS WITH OR WITHOUT FEVER  NAUSEA AND VOMITING THAT IS NOT CONTROLLED WITH YOUR NAUSEA MEDICATION  *UNUSUAL SHORTNESS OF BREATH  *UNUSUAL BRUISING OR BLEEDING  TENDERNESS IN MOUTH AND THROAT WITH OR WITHOUT PRESENCE OF ULCERS  *URINARY PROBLEMS  *BOWEL PROBLEMS  UNUSUAL RASH Items with * indicate a potential emergency and should be followed up as soon as possible. If you have an emergency after office hours please contact your primary care physician or go to the nearest emergency department.  Please call the clinic during office hours if you have any questions or concerns.   You may also contact the Patient Navigator at [phone] should you have any questions or need assistance in obtaining follow up care.      Resources For Cancer Patients and their Caregivers ? American Cancer Society: Can assist with transportation, wigs, general needs, runs Look Good Feel Better.        1-888-227-6333 ? Cancer Care: Provides financial assistance, online support groups, medication/co-pay assistance.   1-800-813-HOPE (4673) ? [facility] Assists [place] cancer patients and their families through emotional , educational and financial support.  336-427-4357 ? [place] DSS Where to apply for food stamps, Medicaid and utility assistance. 336-342-1394 ? RCATS: Transportation to medical appointments. 336-347-2287 ? Social Security Administration: May apply for disability if have a Stage IV cancer. 336-342-7796 1-800-772-1213 ? [place] Aging, Disability and Transit Services: Assists with nutrition, care and transit needs. 336-349-2343         

## 2018-08-14 NOTE — Progress Notes (Signed)
0930 Labs reviewed with and pt seen by Kirby Crigler PA and pt approved for chemo tx today per PA                                                                       [name] tolerated chemo tx well without complaints or incident. VSS upon discharge. Pt discharged self ambulatory in satisfactory condition accompanied by family members

## 2018-08-21 ENCOUNTER — Inpatient Hospital Stay (HOSPITAL_COMMUNITY): Payer: Medicare HMO

## 2018-08-21 ENCOUNTER — Encounter (HOSPITAL_COMMUNITY): Payer: Self-pay

## 2018-08-21 VITALS — BP 127/82 | HR 81 | Temp 98.3°F | Resp 18 | Wt 183.2 lb

## 2018-08-21 DIAGNOSIS — D649 Anemia, unspecified: Secondary | ICD-10-CM

## 2018-08-21 DIAGNOSIS — Z171 Estrogen receptor negative status [ER-]: Secondary | ICD-10-CM | POA: Diagnosis not present

## 2018-08-21 DIAGNOSIS — K219 Gastro-esophageal reflux disease without esophagitis: Secondary | ICD-10-CM | POA: Diagnosis not present

## 2018-08-21 DIAGNOSIS — C50411 Malignant neoplasm of upper-outer quadrant of right female breast: Secondary | ICD-10-CM

## 2018-08-21 DIAGNOSIS — R5383 Other fatigue: Secondary | ICD-10-CM | POA: Diagnosis not present

## 2018-08-21 DIAGNOSIS — Z5111 Encounter for antineoplastic chemotherapy: Secondary | ICD-10-CM | POA: Diagnosis not present

## 2018-08-21 DIAGNOSIS — D509 Iron deficiency anemia, unspecified: Secondary | ICD-10-CM | POA: Diagnosis not present

## 2018-08-21 DIAGNOSIS — I129 Hypertensive chronic kidney disease with stage 1 through stage 4 chronic kidney disease, or unspecified chronic kidney disease: Secondary | ICD-10-CM | POA: Diagnosis not present

## 2018-08-21 DIAGNOSIS — R197 Diarrhea, unspecified: Secondary | ICD-10-CM | POA: Diagnosis not present

## 2018-08-21 DIAGNOSIS — N183 Chronic kidney disease, stage 3 (moderate): Secondary | ICD-10-CM | POA: Diagnosis not present

## 2018-08-21 DIAGNOSIS — G629 Polyneuropathy, unspecified: Secondary | ICD-10-CM | POA: Diagnosis not present

## 2018-08-21 LAB — COMPREHENSIVE METABOLIC PANEL
ALK PHOS: 77 U/L (ref 38–126)
ALT: 23 U/L (ref 0–44)
ANION GAP: 9 (ref 5–15)
AST: 21 U/L (ref 15–41)
Albumin: 3.4 g/dL — ABNORMAL LOW (ref 3.5–5.0)
BILIRUBIN TOTAL: 0.5 mg/dL (ref 0.3–1.2)
BUN: 16 mg/dL (ref 8–23)
CO2: 24 mmol/L (ref 22–32)
Calcium: 8.8 mg/dL — ABNORMAL LOW (ref 8.9–10.3)
Chloride: 98 mmol/L (ref 98–111)
Creatinine, Ser: 1.2 mg/dL — ABNORMAL HIGH (ref 0.44–1.00)
GFR calc non Af Amer: 40 mL/min — ABNORMAL LOW (ref >60)
GFR, EST AFRICAN AMERICAN: 46 mL/min — AB (ref >60)
Glucose, Bld: 104 mg/dL — ABNORMAL HIGH (ref 70–99)
POTASSIUM: 4 mmol/L (ref 3.5–5.1)
Sodium: 131 mmol/L — ABNORMAL LOW (ref 135–145)
TOTAL PROTEIN: 6.6 g/dL (ref 6.5–8.1)

## 2018-08-21 LAB — CBC WITH DIFFERENTIAL/PLATELET
ABS IMMATURE GRANULOCYTES: 0.09 10*3/uL — AB (ref 0.00–0.07)
BASOS ABS: 0.1 10*3/uL (ref 0.0–0.1)
Basophils Relative: 1 %
Eosinophils Absolute: 0.2 10*3/uL (ref 0.0–0.5)
Eosinophils Relative: 3 %
HEMATOCRIT: 30.6 % — AB (ref 36.0–46.0)
Hemoglobin: 9.9 g/dL — ABNORMAL LOW (ref 12.0–15.0)
Immature Granulocytes: 1 %
LYMPHS ABS: 2.1 10*3/uL (ref 0.7–4.0)
LYMPHS PCT: 29 %
MCH: 28.7 pg (ref 26.0–34.0)
MCHC: 32.4 g/dL (ref 30.0–36.0)
MCV: 88.7 fL (ref 80.0–100.0)
MONO ABS: 0.7 10*3/uL (ref 0.1–1.0)
Monocytes Relative: 10 %
NEUTROS ABS: 4.1 10*3/uL (ref 1.7–7.7)
NRBC: 0 % (ref 0.0–0.2)
Neutrophils Relative %: 56 %
Platelets: 427 10*3/uL — ABNORMAL HIGH (ref 150–400)
RBC: 3.45 MIL/uL — AB (ref 3.87–5.11)
RDW: 15.3 % (ref 11.5–15.5)
WBC: 7.3 10*3/uL (ref 4.0–10.5)

## 2018-08-21 LAB — IRON AND TIBC
IRON: 40 ug/dL (ref 28–170)
SATURATION RATIOS: 13 % (ref 10.4–31.8)
TIBC: 301 ug/dL (ref 250–450)
UIBC: 261 ug/dL

## 2018-08-21 LAB — FERRITIN: FERRITIN: 185 ng/mL (ref 11–307)

## 2018-08-21 MED ORDER — ACETAMINOPHEN 325 MG PO TABS
650.0000 mg | ORAL_TABLET | Freq: Once | ORAL | Status: AC
  Administered 2018-08-21: 650 mg via ORAL
  Filled 2018-08-21: qty 2

## 2018-08-21 MED ORDER — SODIUM CHLORIDE 0.9% FLUSH
10.0000 mL | INTRAVENOUS | Status: DC | PRN
  Administered 2018-08-21: 10 mL
  Filled 2018-08-21: qty 10

## 2018-08-21 MED ORDER — TRASTUZUMAB CHEMO 150 MG IV SOLR
2.0000 mg/kg | Freq: Once | INTRAVENOUS | Status: AC
  Administered 2018-08-21: 168 mg via INTRAVENOUS
  Filled 2018-08-21: qty 8

## 2018-08-21 MED ORDER — HEPARIN SOD (PORK) LOCK FLUSH 100 UNIT/ML IV SOLN
500.0000 [IU] | Freq: Once | INTRAVENOUS | Status: AC | PRN
  Administered 2018-08-21: 500 [IU]
  Filled 2018-08-21: qty 5

## 2018-08-21 MED ORDER — SODIUM CHLORIDE 0.9 % IV SOLN
53.3333 mg/m2 | Freq: Once | INTRAVENOUS | Status: AC
  Administered 2018-08-21: 102 mg via INTRAVENOUS
  Filled 2018-08-21: qty 17

## 2018-08-21 MED ORDER — DIPHENHYDRAMINE HCL 50 MG/ML IJ SOLN
50.0000 mg | Freq: Once | INTRAMUSCULAR | Status: AC
  Administered 2018-08-21: 50 mg via INTRAVENOUS
  Filled 2018-08-21: qty 1

## 2018-08-21 MED ORDER — SODIUM CHLORIDE 0.9 % IV SOLN
20.0000 mg | Freq: Once | INTRAVENOUS | Status: AC
  Administered 2018-08-21: 20 mg via INTRAVENOUS
  Filled 2018-08-21: qty 2

## 2018-08-21 MED ORDER — FAMOTIDINE IN NACL 20-0.9 MG/50ML-% IV SOLN
20.0000 mg | Freq: Once | INTRAVENOUS | Status: AC
  Administered 2018-08-21: 20 mg via INTRAVENOUS
  Filled 2018-08-21: qty 50

## 2018-08-21 MED ORDER — SODIUM CHLORIDE 0.9 % IV SOLN
Freq: Once | INTRAVENOUS | Status: AC
  Administered 2018-08-21: 09:00:00 via INTRAVENOUS

## 2018-08-21 NOTE — Progress Notes (Signed)
Patient tolerated treatment with no complaints voiced.  Port site clean and dry with no bruising or swelling noted at site.  Good blood return noted with port before and after treatment.  Band aid applied.  VSS with discharge and left ambulatory with family with no s/s of distress noted.

## 2018-08-21 NOTE — Patient Instructions (Signed)
[facility] Discharge Instructions for Patients Receiving Chemotherapy  Today you received the following chemotherapy agents herceptin and taxol.    If you develop nausea and vomiting that is not controlled by your nausea medication, call the clinic.   BELOW ARE SYMPTOMS THAT SHOULD BE REPORTED IMMEDIATELY:  *FEVER GREATER THAN 100.5 F  *CHILLS WITH OR WITHOUT FEVER  NAUSEA AND VOMITING THAT IS NOT CONTROLLED WITH YOUR NAUSEA MEDICATION  *UNUSUAL SHORTNESS OF BREATH  *UNUSUAL BRUISING OR BLEEDING  TENDERNESS IN MOUTH AND THROAT WITH OR WITHOUT PRESENCE OF ULCERS  *URINARY PROBLEMS  *BOWEL PROBLEMS  UNUSUAL RASH Items with * indicate a potential emergency and should be followed up as soon as possible.  Feel free to call the clinic should you have any questions or concerns. The clinic phone number is [phone].  Please show the CHEMO ALERT CARD at check-in to the Emergency Department and triage nurse.   

## 2018-08-28 ENCOUNTER — Inpatient Hospital Stay (HOSPITAL_COMMUNITY): Payer: Medicare HMO

## 2018-08-28 ENCOUNTER — Other Ambulatory Visit: Payer: Self-pay

## 2018-08-28 ENCOUNTER — Inpatient Hospital Stay (HOSPITAL_COMMUNITY): Payer: Medicare HMO | Attending: Hematology

## 2018-08-28 ENCOUNTER — Inpatient Hospital Stay (HOSPITAL_BASED_OUTPATIENT_CLINIC_OR_DEPARTMENT_OTHER): Payer: Medicare HMO | Admitting: Hematology

## 2018-08-28 ENCOUNTER — Encounter (HOSPITAL_COMMUNITY): Payer: Self-pay | Admitting: Hematology

## 2018-08-28 VITALS — BP 127/63 | HR 78 | Temp 98.6°F | Resp 18 | Wt 179.0 lb

## 2018-08-28 DIAGNOSIS — R531 Weakness: Secondary | ICD-10-CM | POA: Insufficient documentation

## 2018-08-28 DIAGNOSIS — I10 Essential (primary) hypertension: Secondary | ICD-10-CM | POA: Diagnosis not present

## 2018-08-28 DIAGNOSIS — R5383 Other fatigue: Secondary | ICD-10-CM | POA: Insufficient documentation

## 2018-08-28 DIAGNOSIS — Z5111 Encounter for antineoplastic chemotherapy: Secondary | ICD-10-CM | POA: Insufficient documentation

## 2018-08-28 DIAGNOSIS — E785 Hyperlipidemia, unspecified: Secondary | ICD-10-CM | POA: Insufficient documentation

## 2018-08-28 DIAGNOSIS — Z806 Family history of leukemia: Secondary | ICD-10-CM | POA: Insufficient documentation

## 2018-08-28 DIAGNOSIS — Z171 Estrogen receptor negative status [ER-]: Secondary | ICD-10-CM | POA: Insufficient documentation

## 2018-08-28 DIAGNOSIS — R21 Rash and other nonspecific skin eruption: Secondary | ICD-10-CM | POA: Insufficient documentation

## 2018-08-28 DIAGNOSIS — R197 Diarrhea, unspecified: Secondary | ICD-10-CM | POA: Diagnosis not present

## 2018-08-28 DIAGNOSIS — C50411 Malignant neoplasm of upper-outer quadrant of right female breast: Secondary | ICD-10-CM

## 2018-08-28 DIAGNOSIS — Z79899 Other long term (current) drug therapy: Secondary | ICD-10-CM | POA: Diagnosis not present

## 2018-08-28 DIAGNOSIS — Z7982 Long term (current) use of aspirin: Secondary | ICD-10-CM | POA: Diagnosis not present

## 2018-08-28 DIAGNOSIS — K219 Gastro-esophageal reflux disease without esophagitis: Secondary | ICD-10-CM | POA: Insufficient documentation

## 2018-08-28 DIAGNOSIS — Z5112 Encounter for antineoplastic immunotherapy: Secondary | ICD-10-CM | POA: Insufficient documentation

## 2018-08-28 LAB — CBC WITH DIFFERENTIAL/PLATELET
Abs Immature Granulocytes: 0.09 10*3/uL — ABNORMAL HIGH (ref 0.00–0.07)
BASOS ABS: 0 10*3/uL (ref 0.0–0.1)
Basophils Relative: 1 %
EOS ABS: 0.2 10*3/uL (ref 0.0–0.5)
Eosinophils Relative: 2 %
HEMATOCRIT: 32.7 % — AB (ref 36.0–46.0)
HEMOGLOBIN: 10.5 g/dL — AB (ref 12.0–15.0)
IMMATURE GRANULOCYTES: 1 %
LYMPHS ABS: 2.1 10*3/uL (ref 0.7–4.0)
Lymphocytes Relative: 29 %
MCH: 29 pg (ref 26.0–34.0)
MCHC: 32.1 g/dL (ref 30.0–36.0)
MCV: 90.3 fL (ref 80.0–100.0)
MONOS PCT: 9 %
Monocytes Absolute: 0.7 10*3/uL (ref 0.1–1.0)
NEUTROS PCT: 58 %
NRBC: 0 % (ref 0.0–0.2)
Neutro Abs: 4.3 10*3/uL (ref 1.7–7.7)
Platelets: 451 10*3/uL — ABNORMAL HIGH (ref 150–400)
RBC: 3.62 MIL/uL — ABNORMAL LOW (ref 3.87–5.11)
RDW: 15.2 % (ref 11.5–15.5)
WBC: 7.4 10*3/uL (ref 4.0–10.5)

## 2018-08-28 LAB — COMPREHENSIVE METABOLIC PANEL
ALBUMIN: 3.5 g/dL (ref 3.5–5.0)
ALK PHOS: 77 U/L (ref 38–126)
ALT: 27 U/L (ref 0–44)
AST: 27 U/L (ref 15–41)
Anion gap: 8 (ref 5–15)
BILIRUBIN TOTAL: 0.5 mg/dL (ref 0.3–1.2)
BUN: 14 mg/dL (ref 8–23)
CO2: 25 mmol/L (ref 22–32)
Calcium: 9 mg/dL (ref 8.9–10.3)
Chloride: 98 mmol/L (ref 98–111)
Creatinine, Ser: 1.17 mg/dL — ABNORMAL HIGH (ref 0.44–1.00)
GFR calc Af Amer: 47 mL/min — ABNORMAL LOW (ref >60)
GFR, EST NON AFRICAN AMERICAN: 41 mL/min — AB (ref >60)
GLUCOSE: 119 mg/dL — AB (ref 70–99)
Potassium: 4.3 mmol/L (ref 3.5–5.1)
Sodium: 131 mmol/L — ABNORMAL LOW (ref 135–145)
TOTAL PROTEIN: 7 g/dL (ref 6.5–8.1)

## 2018-08-28 MED ORDER — DIPHENHYDRAMINE HCL 50 MG/ML IJ SOLN
50.0000 mg | Freq: Once | INTRAMUSCULAR | Status: AC
  Administered 2018-08-28: 50 mg via INTRAVENOUS
  Filled 2018-08-28: qty 1

## 2018-08-28 MED ORDER — TRASTUZUMAB CHEMO 150 MG IV SOLR
2.0000 mg/kg | Freq: Once | INTRAVENOUS | Status: AC
  Administered 2018-08-28: 168 mg via INTRAVENOUS
  Filled 2018-08-28: qty 8

## 2018-08-28 MED ORDER — HEPARIN SOD (PORK) LOCK FLUSH 100 UNIT/ML IV SOLN
INTRAVENOUS | Status: AC
  Filled 2018-08-28: qty 5

## 2018-08-28 MED ORDER — SODIUM CHLORIDE 0.9% FLUSH
10.0000 mL | INTRAVENOUS | Status: DC | PRN
  Administered 2018-08-28: 10 mL
  Filled 2018-08-28: qty 10

## 2018-08-28 MED ORDER — ACETAMINOPHEN 325 MG PO TABS
650.0000 mg | ORAL_TABLET | Freq: Once | ORAL | Status: AC
  Administered 2018-08-28: 650 mg via ORAL
  Filled 2018-08-28: qty 2

## 2018-08-28 MED ORDER — SODIUM CHLORIDE 0.9 % IV SOLN
20.0000 mg | Freq: Once | INTRAVENOUS | Status: AC
  Administered 2018-08-28: 20 mg via INTRAVENOUS
  Filled 2018-08-28: qty 2

## 2018-08-28 MED ORDER — HEPARIN SOD (PORK) LOCK FLUSH 100 UNIT/ML IV SOLN
500.0000 [IU] | Freq: Once | INTRAVENOUS | Status: AC | PRN
  Administered 2018-08-28: 500 [IU]
  Filled 2018-08-28: qty 5

## 2018-08-28 MED ORDER — SODIUM CHLORIDE 0.9 % IV SOLN
Freq: Once | INTRAVENOUS | Status: AC
  Administered 2018-08-28: 12:00:00 via INTRAVENOUS

## 2018-08-28 MED ORDER — FAMOTIDINE IN NACL 20-0.9 MG/50ML-% IV SOLN
20.0000 mg | Freq: Once | INTRAVENOUS | Status: AC
  Administered 2018-08-28: 20 mg via INTRAVENOUS
  Filled 2018-08-28: qty 50

## 2018-08-28 MED ORDER — SODIUM CHLORIDE 0.9 % IV SOLN
53.3333 mg/m2 | Freq: Once | INTRAVENOUS | Status: AC
  Administered 2018-08-28: 102 mg via INTRAVENOUS
  Filled 2018-08-28: qty 17

## 2018-08-28 NOTE — Assessment & Plan Note (Signed)
1.  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 - Right breast biopsy at 11:30 position on 07/03/2018 shows invasive ductal carcinoma, ER/PR negative, HER-2 positive, Ki-67 of 40%, right axillary lymph node biopsy shows benign tissue, left breast core needle biopsy at 230 o'clock position shows complex sclerosing lesion - CT chest abdomen and pelvis on 07/16/2018 showed a 6 mm nodule in the posterior left lower lobe with no other evidence of metastatic disease. -Bone scan on 07/16/2018 shows focal area of increased activity over the distal left clavicle/shoulder consistent with degenerative changes. - Skin punch biopsy of the right breast on 07/28/2018 was positive for dermal lymphatic invasion. -Echo on 07/27/2018 shows ejection fraction of 55 to 60%. - She started on weekly paclitaxel and Herceptin on 07/31/2001.  She is tolerating treatment very well.   - She finished 4 weeks of treatment and did not have any noticeable side effects except some fatigue.  I have reviewed her blood counts.  Today she may proceed with her week 5 at the same dose level.  We will reevaluate her in 1 week. - Breast examination today showed decrease in the size of erythema with no warmth and decrease in size of the breast mass.

## 2018-08-28 NOTE — Progress Notes (Signed)
[name], [place] [id number]   CLINIC:  Medical Oncology/Hematology  PCP:  Adrienne Squibb, MD [address] [place] [address] [phone]   REASON FOR VISIT: Follow-up for right breast cancer, ER-/PR-/HER2+  CURRENT THERAPY: Paclitaxel and herceptin  BRIEF ONCOLOGIC HISTORY:    Malignant neoplasm of upper-outer quadrant of right breast in female, estrogen receptor negative ([place])   07/03/2018 Initial Diagnosis    Palpable right breast mass for several months with skin discoloration, clinically skin trabecular thickening with nipple retraction measuring 7.2 x 4.3 x 4.2 cm, additional mass 4 o'clock position 2.6 cm, right axillary tail 1.2 cm right axillary lymph node 1.8 cm left breast irregular mass 1 cm, adjacent mass 0.9 cm together measuring 1.9 cm, no lymphadenopathy    07/03/2018 Pathology Results    Right breast biopsy: IDC grade 2, ER 0%, PR 0%, HER-2 positive, Ki-67 40%, lymph node biopsy negative, left breast biopsy complex sclerosing lesion     07/15/2018 Cancer Staging    Staging form: Breast, AJCC 8th Edition - Clinical: Stage IIB (cT3, cN0, cM0, G2, ER-, PR-, HER2+) - Signed by Adrienne Lose, MD on 07/15/2018    07/29/2018 -  Chemotherapy    The patient had trastuzumab (HERCEPTIN) 336 mg in sodium chloride 0.9 % 250 mL chemo infusion, 4 mg/kg = 336 mg, Intravenous,  Once, 2 of 3 cycles Administration: 336 mg (07/31/2018), 168 mg (08/07/2018), 168 mg (08/14/2018), 168 mg (08/21/2018) PACLitaxel (TAXOL) 78 mg in sodium chloride 0.9 % 250 mL chemo infusion (</= 51m/m2), 40 mg/m2 = 78 mg (50 % of original dose 80 mg/m2), Intravenous,  Once, 2 of 3 cycles Dose modification: 40 mg/m2 (50 % of original dose 80 mg/m2, Cycle 1, Reason: Patient Age), 53.3333 mg/m2 (66.7 % of original dose 80 mg/m2, Cycle 1, Reason: Provider Judgment) Administration: 78 mg (07/31/2018), 102 mg (08/07/2018), 102 mg (08/14/2018), 102 mg (08/21/2018)  for  chemotherapy treatment.       CANCER STAGING: Cancer Staging Malignant neoplasm of upper-outer quadrant of right breast in female, estrogen receptor negative ([place] Staging form: Breast, AJCC 8th Edition - Clinical: Stage IIB (cT3, cN0, cM0, G2, ER-, PR-, HER2+) - Signed by Adrienne Lose MD on 07/15/2018    INTERVAL HISTORY:  [name]. female returns for routine follow-up for right breast cancer. Patient has had increase in fatigue and diarrhea. She had has it worse right after treatment. It is well controlled with medication. Her bruising/redness on her right breast is improved since her last visit. She denies pain to the area. She denies any nausea or vomiting. Denies any new pains or lumps. Denies any skin rashes or mouth sores. Denies any numbness or tingling. She reports her appetite at 75% and she is maintaining her weight at this time. Her energy level is 50%.    REVIEW OF SYSTEMS:  Review of Systems  Gastrointestinal: Positive for diarrhea.  Neurological: Positive for extremity weakness.  All other systems reviewed and are negative.    PAST MEDICAL/SURGICAL HISTORY:  Past Medical History:  Diagnosis Date  . Cancer ([facility]   right breast cancer  . GERD (gastroesophageal reflux disease)    occasional   . Hyperlipidemia   . Hypertension    clearance with note [place] [place] [facility]  . Pneumonia    2 years ago/ states occ cough with sinus drainage- no fever  . Thyroid nodule    with biopsy- states following medically  Past Surgical History:  Procedure Laterality Date  . CATARACT EXTRACTION W/PHACO  11/30/2012   Procedure: CATARACT EXTRACTION PHACO AND INTRAOCULAR LENS PLACEMENT (IOC);  Surgeon: Adrienne Che, MD;  Location: [facility];  Service: Ophthalmology;  Laterality: Left;  CDE:11.49  . CATARACT EXTRACTION W/PHACO Right 03/15/2013   Procedure: CATARACT EXTRACTION PHACO AND INTRAOCULAR LENS PLACEMENT (IOC);  Surgeon: Adrienne Che, MD;  Location: [facility];   Service: Ophthalmology;  Laterality: Right;  CDE 16.15  . COLONOSCOPY    . EXCISION OF SKIN TAG Right 06/17/2016   Procedure: SHAVE OF SKIN TAG RIGHT BUTTOCK;  Surgeon: Adrienne Signs, MD;  Location: [facility];  Service: General;  Laterality: Right;  . JOINT REPLACEMENT     left knee  . MASS EXCISION Left 06/17/2016   Procedure: EXCISION SKIN MALIGNANT LESION LEFT BUTTOCK;  Surgeon: Adrienne Signs, MD;  Location: [facility];  Service: General;  Laterality: Left;  . PORTACATH PLACEMENT Right 07/28/2018   Procedure: INSERTION PORT-A-CATH WITH ULTRASOUND;  Surgeon: Adrienne Bookbinder, MD;  Location: [facility];  Service: General;  Laterality: Right;  . PUNCH BIOPSY OF SKIN Right 07/28/2018   Procedure: PUNCH BIOPSY OF SKIN RIGHT BREAST;  Surgeon: Adrienne Bookbinder, MD;  Location: [facility];  Service: General;  Laterality: Right;  . TOTAL KNEE ARTHROPLASTY  12/16/2011   Procedure: TOTAL KNEE ARTHROPLASTY;  Surgeon: Adrienne Alf, MD;  Location: [facility];  Service: Orthopedics;  Laterality: Right;  . TUBAL LIGATION       SOCIAL HISTORY:  Social History   Socioeconomic History  . Marital status: Married    Spouse name: Not on file  . Number of children: 2  . Years of education: some business school after HS  . Highest education level: Not on file  Occupational History  . Occupation: Cabin crew  . Occupation: Network engineer   Social Needs  . Financial resource strain: Not hard at all  . Food insecurity:    Worry: Never true    Inability: Never true  . Transportation needs:    Medical: No    Non-medical: No  Tobacco Use  . Smoking status: Never Smoker  . Smokeless tobacco: Never Used  Substance and Sexual Activity  . Alcohol use: No    Frequency: Never  . Drug use: No  . Sexual activity: Yes    Birth control/protection: None  Lifestyle  . Physical activity:    Days per week: 0 days    Minutes per session: 0 min  . Stress: Not at all  Relationships  . Social  connections:    Talks on phone: More than three times a week    Gets together: More than three times a week    Attends religious service: More than 4 times per year    Active member of club or organization: Yes    Attends meetings of clubs or organizations: Never    Relationship status: Married  . Intimate partner violence:    Fear of current or ex partner: No    Emotionally abused: No    Physically abused: No    Forced sexual activity: No  Other Topics Concern  . Not on file  Social History Narrative   Lives at home with her husband.   4 cups caffeine per day.   Right-handed.    FAMILY HISTORY:  Family History  Problem Relation Age of Onset  . Heart attack Mother   . Bronchitis Father   . Diabetes Sister   .  Leukemia Brother   . Lung disease Brother   . Lung disease Sister     CURRENT MEDICATIONS:  Outpatient Encounter Medications as of 08/28/2018  Medication Sig  . acetaminophen (TYLENOL) 325 MG tablet Take 650 mg by mouth every 6 (six) hours as needed.  Marland Kitchen aspirin 325 MG tablet Take 162.5 mg by mouth daily.   . cetirizine (ZYRTEC) 10 MG tablet Take 10 mg by mouth daily.  . diphenhydrAMINE (BENADRYL) 25 MG tablet Take 25 mg by mouth at bedtime as needed.  . lidocaine-prilocaine (EMLA) cream Apply to port site one hour prior to appointment and cover with plastic wrap.  . losartan (COZAAR) 100 MG tablet Take 100 mg by mouth daily.   . Multiple Vitamin (MULTIVITAMIN WITH MINERALS) TABS tablet Take 1 tablet by mouth daily.  . Omega-3 Fatty Acids (FISH OIL PO) Take 2 capsules by mouth daily.   Marland Kitchen PACLitaxel (TAXOL IV) Inject into the vein.  . pantoprazole (PROTONIX) 40 MG tablet Take 40 mg by mouth daily.  . prochlorperazine (COMPAZINE) 10 MG tablet Take 1 tablet (10 mg total) by mouth every 6 (six) hours as needed (Nausea or vomiting).  . simvastatin (ZOCOR) 10 MG tablet Take 10 mg by mouth daily.  . traMADol (ULTRAM) 50 MG tablet Take 1 tablet (50 mg total) by mouth every 6  (six) hours as needed.  . Trastuzumab (HERCEPTIN IV) Inject into the vein.   No facility-administered encounter medications on file as of 08/28/2018.     ALLERGIES:  Allergies  Allergen Reactions  . Penicillins Rash    Has patient had a PCN reaction causing immediate rash, facial/tongue/throat swelling, SOB or lightheadedness with hypotension: No Has patient had a PCN reaction causing severe rash involving mucus membranes or skin necrosis: No Has patient had a PCN reaction that required hospitalization: No Has patient had a PCN reaction occurring within the last 10 years: No If all of the above answers are "NO", then may proceed with Cephalosporin use.      PHYSICAL EXAM:  ECOG Performance status: 1  Vital Kelly: BP: 137/67, P:95, R:18, T:98.1, SATS:100% Weight: 179  Physical Exam  Constitutional: She is oriented to person, place, and time. She appears well-developed and well-nourished.  Cardiovascular: Normal rate, regular rhythm and normal heart sounds.  Pulmonary/Chest: Effort normal and breath sounds normal.  Musculoskeletal: Normal range of motion.  Neurological: She is alert and oriented to person, place, and time.  Skin: Skin is warm and dry.  Psychiatric: She has a normal mood and affect. Her behavior is normal. Judgment and thought content normal.  Breast examination: There is decreased erythema in the upper quadrant of the right breast.  There is no warmth.  There is decrease in size of the breast mass.   LABORATORY DATA:  I have reviewed the labs as listed.  CBC    Component Value Date/Time   WBC 7.4 08/28/2018 1043   RBC 3.62 (L) 08/28/2018 1043   HGB 10.5 (L) 08/28/2018 1043   HGB 12.7 11/24/2017 1626   HCT 32.7 (L) 08/28/2018 1043   HCT 36.6 11/24/2017 1626   PLT 451 (H) 08/28/2018 1043   PLT 420 (H) 11/24/2017 1626   MCV 90.3 08/28/2018 1043   MCV 88 11/24/2017 1626   MCH 29.0 08/28/2018 1043   MCHC 32.1 08/28/2018 1043   RDW 15.2 08/28/2018 1043    RDW 15.4 11/24/2017 1626   LYMPHSABS 2.1 08/28/2018 1043   LYMPHSABS 2.5 11/24/2017 1626   MONOABS 0.7 08/28/2018  1043   EOSABS 0.2 08/28/2018 1043   EOSABS 0.1 11/24/2017 1626   BASOSABS 0.0 08/28/2018 1043   BASOSABS 0.0 11/24/2017 1626   CMP Latest Ref Rng & Units 08/28/2018 08/21/2018 08/14/2018  Glucose 70 - 99 mg/dL 119(H) 104(H) 116(H)  BUN 8 - 23 mg/dL _0 Creatinine 0.44 - 1.00 mg/dL 1.17(H) 1.20(H) 1.13(H)  Sodium 135 - 145 mmol/L 131(L) 131(L) 133(L)  Potassium 3.5 - 5.1 mmol/L 4.3 4.0 4.6  Chloride 98 - 111 mmol/L 98 98 99  CO2 22 - 32 mmol/L _1 Calcium 8.9 - 10.3 mg/dL 9.0 8.8(L) 9.3  Total Protein 6.5 - 8.1 g/dL 7.0 6.6 6.9  Total Bilirubin 0.3 - 1.2 mg/dL 0.5 0.5 0.5  Alkaline Phos 38 - 126 U/L 77 77 79  AST 15 - 41 U/L _2 ALT 0 - 44 U/L _3 ASSESSMENT & PLAN:   Malignant neoplasm of upper-outer quadrant of right breast in female, estrogen receptor negative ([facility]) 1.  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 - Right breast biopsy at 11:30 position on 07/03/2018 shows invasive ductal carcinoma, ER/PR negative, HER-2 positive, Ki-67 of 40%, right axillary lymph node biopsy shows benign tissue, left breast core needle biopsy at 230 o'clock position shows complex sclerosing lesion - CT chest abdomen and pelvis on 07/16/2018 showed a 6 mm nodule in the posterior left lower lobe with no other evidence of metastatic disease. -Bone scan on 07/16/2018 shows focal area of increased activity over the distal left  clavicle/shoulder consistent with degenerative changes. - Skin punch biopsy of the right breast on 07/28/2018 was positive for dermal lymphatic invasion. -Echo on 07/27/2018 shows ejection fraction of 55 to 60%. - She started on weekly paclitaxel and Herceptin on 07/31/2001.  She is tolerating treatment very well.   - She finished 4 weeks of treatment and did not have any noticeable side effects except some fatigue.  I have reviewed her blood counts.  Today she may proceed with her week 5 at the same dose level.  We will reevaluate her in 1 week. - Breast examination today showed decrease in the size of erythema with no warmth and decrease in size of the breast mass.      Orders placed this encounter:  Orders Placed This Encounter  Procedures  . CBC with Differential/Platelet  . Comprehensive metabolic panel      SDerek Jack MD [place] [phone]

## 2018-08-28 NOTE — Patient Instructions (Signed)
[facility] Discharge Instructions for Patients Receiving Chemotherapy   Beginning January 23rd 2017 lab work for the Cancer Center will be done in the  Main lab at [facility] on 1st floor. If you have a lab appointment with the Cancer Center please come in thru the  Main Entrance and check in at the main information desk   Today you received the following chemotherapy agents   To help prevent nausea and vomiting after your treatment, we encourage you to take your nausea medication     If you develop nausea and vomiting, or diarrhea that is not controlled by your medication, call the clinic.  The clinic phone number is [phone]. Office hours are Monday-Friday 8:30am-5:00pm.  BELOW ARE SYMPTOMS THAT SHOULD BE REPORTED IMMEDIATELY:  *FEVER GREATER THAN 101.0 F  *CHILLS WITH OR WITHOUT FEVER  NAUSEA AND VOMITING THAT IS NOT CONTROLLED WITH YOUR NAUSEA MEDICATION  *UNUSUAL SHORTNESS OF BREATH  *UNUSUAL BRUISING OR BLEEDING  TENDERNESS IN MOUTH AND THROAT WITH OR WITHOUT PRESENCE OF ULCERS  *URINARY PROBLEMS  *BOWEL PROBLEMS  UNUSUAL RASH Items with * indicate a potential emergency and should be followed up as soon as possible. If you have an emergency after office hours please contact your primary care physician or go to the nearest emergency department.  Please call the clinic during office hours if you have any questions or concerns.   You may also contact the Patient Navigator at [phone] should you have any questions or need assistance in obtaining follow up care.      Resources For Cancer Patients and their Caregivers ? American Cancer Society: Can assist with transportation, wigs, general needs, runs Look Good Feel Better.        1-888-227-6333 ? Cancer Care: Provides financial assistance, online support groups, medication/co-pay assistance.  1-800-813-HOPE (4673) ? [facility] Assists [place] cancer  patients and their families through emotional , educational and financial support.  336-427-4357 ? [place] DSS Where to apply for food stamps, Medicaid and utility assistance. 336-342-1394 ? RCATS: Transportation to medical appointments. 336-347-2287 ? Social Security Administration: May apply for disability if have a Stage IV cancer. 336-342-7796 1-800-772-1213 ? [place] Aging, Disability and Transit Services: Assists with nutrition, care and transit needs. 336-349-2343         

## 2018-08-28 NOTE — Patient Instructions (Signed)
[facility] at [facility] Discharge Instructions  Follow up in 1 week with labs and treatment.    Thank you for choosing [facility] at [facility] to provide your oncology and hematology care.  To afford each patient quality time with our provider, please arrive at least 15 minutes before your scheduled appointment time.   If you have a lab appointment with the Cancer Center please come in thru the  Main Entrance and check in at the main information desk  You need to re-schedule your appointment should you arrive 10 or more minutes late.  We strive to give you quality time with our providers, and arriving late affects you and other patients whose appointments are after yours.  Also, if you no show three or more times for appointments you may be dismissed from the clinic at the providers discretion.     Again, thank you for choosing [facility].  Our hope is that these requests will decrease the amount of time that you wait before being seen by our physicians.       _____________________________________________________________  Should you have questions after your visit to  Cancer Center, please contact our office at [phone] between the hours of 8:00 a.m. and 4:30 p.m.  Voicemails left after 4:00 p.m. will not be returned until the following business day.  For prescription refill requests, have your pharmacy contact our office and allow 72 hours.    Cancer Center Support Programs:   > Cancer Support Group  2nd Tuesday of the month 1pm-2pm, Journey Room    

## 2018-08-28 NOTE — Progress Notes (Signed)
Patient tolerated chemotherapy with no complaints voiced.  Port site clean and dry with no bruising or swelling noted at site.  Good blood return noted before deacessing.  Band aid applied.  VSS with discharge and left ambulatory with family with no s/s of distress noted.

## 2018-08-28 NOTE — Progress Notes (Signed)
Labs reviewed with Md today at office visit. Will proceed with treatment today per MD.

## 2018-09-04 ENCOUNTER — Inpatient Hospital Stay (HOSPITAL_COMMUNITY): Payer: Medicare HMO

## 2018-09-04 ENCOUNTER — Other Ambulatory Visit (HOSPITAL_COMMUNITY): Payer: Medicare HMO

## 2018-09-04 ENCOUNTER — Encounter (HOSPITAL_COMMUNITY): Payer: Self-pay

## 2018-09-04 ENCOUNTER — Encounter (HOSPITAL_COMMUNITY): Payer: Self-pay | Admitting: Hematology

## 2018-09-04 ENCOUNTER — Other Ambulatory Visit: Payer: Self-pay

## 2018-09-04 ENCOUNTER — Inpatient Hospital Stay (HOSPITAL_COMMUNITY): Payer: Medicare HMO | Admitting: Hematology

## 2018-09-04 VITALS — BP 152/79 | HR 100 | Temp 98.1°F | Resp 18 | Wt 180.0 lb

## 2018-09-04 VITALS — BP 139/56 | HR 86 | Temp 97.8°F | Resp 18 | Wt 180.0 lb

## 2018-09-04 DIAGNOSIS — R5383 Other fatigue: Secondary | ICD-10-CM | POA: Diagnosis not present

## 2018-09-04 DIAGNOSIS — E785 Hyperlipidemia, unspecified: Secondary | ICD-10-CM

## 2018-09-04 DIAGNOSIS — C50411 Malignant neoplasm of upper-outer quadrant of right female breast: Secondary | ICD-10-CM

## 2018-09-04 DIAGNOSIS — K219 Gastro-esophageal reflux disease without esophagitis: Secondary | ICD-10-CM | POA: Diagnosis not present

## 2018-09-04 DIAGNOSIS — Z79899 Other long term (current) drug therapy: Secondary | ICD-10-CM

## 2018-09-04 DIAGNOSIS — Z5112 Encounter for antineoplastic immunotherapy: Secondary | ICD-10-CM

## 2018-09-04 DIAGNOSIS — Z171 Estrogen receptor negative status [ER-]: Secondary | ICD-10-CM

## 2018-09-04 DIAGNOSIS — Z5111 Encounter for antineoplastic chemotherapy: Secondary | ICD-10-CM | POA: Diagnosis not present

## 2018-09-04 DIAGNOSIS — Z7982 Long term (current) use of aspirin: Secondary | ICD-10-CM

## 2018-09-04 DIAGNOSIS — I10 Essential (primary) hypertension: Secondary | ICD-10-CM

## 2018-09-04 DIAGNOSIS — R21 Rash and other nonspecific skin eruption: Secondary | ICD-10-CM

## 2018-09-04 DIAGNOSIS — Z806 Family history of leukemia: Secondary | ICD-10-CM

## 2018-09-04 DIAGNOSIS — R197 Diarrhea, unspecified: Secondary | ICD-10-CM | POA: Diagnosis not present

## 2018-09-04 DIAGNOSIS — R531 Weakness: Secondary | ICD-10-CM

## 2018-09-04 LAB — COMPREHENSIVE METABOLIC PANEL
ALK PHOS: 78 U/L (ref 38–126)
ALT: 32 U/L (ref 0–44)
ANION GAP: 10 (ref 5–15)
AST: 28 U/L (ref 15–41)
Albumin: 3.6 g/dL (ref 3.5–5.0)
BUN: 15 mg/dL (ref 8–23)
CO2: 23 mmol/L (ref 22–32)
CREATININE: 1.12 mg/dL — AB (ref 0.44–1.00)
Calcium: 9.1 mg/dL (ref 8.9–10.3)
Chloride: 94 mmol/L — ABNORMAL LOW (ref 98–111)
GFR calc non Af Amer: 43 mL/min — ABNORMAL LOW (ref >60)
GFR, EST AFRICAN AMERICAN: 50 mL/min — AB (ref >60)
GLUCOSE: 124 mg/dL — AB (ref 70–99)
Potassium: 4.3 mmol/L (ref 3.5–5.1)
Sodium: 127 mmol/L — ABNORMAL LOW (ref 135–145)
Total Bilirubin: 0.6 mg/dL (ref 0.3–1.2)
Total Protein: 7 g/dL (ref 6.5–8.1)

## 2018-09-04 LAB — CBC WITH DIFFERENTIAL/PLATELET
Abs Immature Granulocytes: 0.13 10*3/uL — ABNORMAL HIGH (ref 0.00–0.07)
Basophils Absolute: 0.1 10*3/uL (ref 0.0–0.1)
Basophils Relative: 1 %
EOS PCT: 2 %
Eosinophils Absolute: 0.2 10*3/uL (ref 0.0–0.5)
HEMATOCRIT: 31.4 % — AB (ref 36.0–46.0)
HEMOGLOBIN: 10.2 g/dL — AB (ref 12.0–15.0)
Immature Granulocytes: 2 %
LYMPHS ABS: 2.2 10*3/uL (ref 0.7–4.0)
LYMPHS PCT: 25 %
MCH: 28.8 pg (ref 26.0–34.0)
MCHC: 32.5 g/dL (ref 30.0–36.0)
MCV: 88.7 fL (ref 80.0–100.0)
MONO ABS: 0.7 10*3/uL (ref 0.1–1.0)
MONOS PCT: 8 %
Neutro Abs: 5.3 10*3/uL (ref 1.7–7.7)
Neutrophils Relative %: 62 %
Platelets: 451 10*3/uL — ABNORMAL HIGH (ref 150–400)
RBC: 3.54 MIL/uL — ABNORMAL LOW (ref 3.87–5.11)
RDW: 15.5 % (ref 11.5–15.5)
WBC: 8.5 10*3/uL (ref 4.0–10.5)
nRBC: 0 % (ref 0.0–0.2)

## 2018-09-04 MED ORDER — ACETAMINOPHEN 325 MG PO TABS
650.0000 mg | ORAL_TABLET | Freq: Once | ORAL | Status: AC
  Administered 2018-09-04: 650 mg via ORAL

## 2018-09-04 MED ORDER — DIPHENHYDRAMINE HCL 50 MG/ML IJ SOLN
INTRAMUSCULAR | Status: AC
  Filled 2018-09-04: qty 1

## 2018-09-04 MED ORDER — SODIUM CHLORIDE 0.9 % IV SOLN
Freq: Once | INTRAVENOUS | Status: AC
  Administered 2018-09-04: 11:00:00 via INTRAVENOUS

## 2018-09-04 MED ORDER — DIPHENHYDRAMINE HCL 50 MG/ML IJ SOLN
50.0000 mg | Freq: Once | INTRAMUSCULAR | Status: AC
  Administered 2018-09-04: 50 mg via INTRAVENOUS

## 2018-09-04 MED ORDER — ACETAMINOPHEN 325 MG PO TABS
ORAL_TABLET | ORAL | Status: AC
  Filled 2018-09-04: qty 2

## 2018-09-04 MED ORDER — FAMOTIDINE IN NACL 20-0.9 MG/50ML-% IV SOLN
INTRAVENOUS | Status: AC
  Filled 2018-09-04: qty 50

## 2018-09-04 MED ORDER — SODIUM CHLORIDE 0.9% FLUSH
10.0000 mL | INTRAVENOUS | Status: DC | PRN
  Administered 2018-09-04: 10 mL
  Filled 2018-09-04: qty 10

## 2018-09-04 MED ORDER — FAMOTIDINE IN NACL 20-0.9 MG/50ML-% IV SOLN
20.0000 mg | Freq: Once | INTRAVENOUS | Status: AC
  Administered 2018-09-04: 20 mg via INTRAVENOUS

## 2018-09-04 MED ORDER — TRASTUZUMAB CHEMO 150 MG IV SOLR
2.0000 mg/kg | Freq: Once | INTRAVENOUS | Status: AC
  Administered 2018-09-04: 168 mg via INTRAVENOUS
  Filled 2018-09-04: qty 8

## 2018-09-04 MED ORDER — HYDROCORTISONE 1 % EX OINT: 1.0000 "application " | TOPICAL_OINTMENT | Freq: Two times a day (BID) | CUTANEOUS | 0 refills | Status: DC

## 2018-09-04 MED ORDER — HEPARIN SOD (PORK) LOCK FLUSH 100 UNIT/ML IV SOLN
INTRAVENOUS | Status: AC
  Filled 2018-09-04: qty 5

## 2018-09-04 MED ORDER — SODIUM CHLORIDE 0.9 % IV SOLN
53.3333 mg/m2 | Freq: Once | INTRAVENOUS | Status: AC
  Administered 2018-09-04: 102 mg via INTRAVENOUS
  Filled 2018-09-04: qty 17

## 2018-09-04 MED ORDER — SODIUM CHLORIDE 0.9 % IV SOLN
20.0000 mg | Freq: Once | INTRAVENOUS | Status: AC
  Administered 2018-09-04: 20 mg via INTRAVENOUS
  Filled 2018-09-04: qty 2

## 2018-09-04 MED ORDER — HEPARIN SOD (PORK) LOCK FLUSH 100 UNIT/ML IV SOLN
500.0000 [IU] | Freq: Once | INTRAVENOUS | Status: AC | PRN
  Administered 2018-09-04: 500 [IU]

## 2018-09-04 MED ORDER — DEXAMETHASONE 2 MG PO TABS: 2.0000 mg | ORAL_TABLET | Freq: Two times a day (BID) | ORAL | 0 refills | Status: DC

## 2018-09-04 NOTE — Patient Instructions (Signed)
[facility] at [facility] Discharge Instructions     Thank you for choosing [facility] at [facility] to provide your oncology and hematology care.  To afford each patient quality time with our provider, please arrive at least 15 minutes before your scheduled appointment time.   If you have a lab appointment with the Cancer Center please come in thru the  Main Entrance and check in at the main information desk  You need to re-schedule your appointment should you arrive 10 or more minutes late.  We strive to give you quality time with our providers, and arriving late affects you and other patients whose appointments are after yours.  Also, if you no show three or more times for appointments you may be dismissed from the clinic at the providers discretion.     Again, thank you for choosing [facility].  Our hope is that these requests will decrease the amount of time that you wait before being seen by our physicians.       _____________________________________________________________  Should you have questions after your visit to [facility], please contact our office at [phone] between the hours of 8:00 a.m. and 4:30 p.m.  Voicemails left after 4:00 p.m. will not be returned until the following business day.  For prescription refill requests, have your pharmacy contact our office and allow 72 hours.    Cancer Center Support Programs:   > Cancer Support Group  2nd Tuesday of the month 1pm-2pm, Journey Room    

## 2018-09-04 NOTE — Progress Notes (Signed)
Pt here today for treatment.  BP slightly elevated. No complaints of any changes since last visit except a rash on lower legs bilateral. MAR updated.   Dr. [name] at the bedside. Plan of care discussed. Labs reviewed. Rash assessed. Per MD proceed with treatment. Hydrocortisone cream ordered for home use with Decadron PO to taken BID am and pm.   Treatment given today per MD orders. Tolerated infusion without adverse affects. Vital signs stable. No complaints at this time. Discharged from clinic ambulatory. F/U with [facility] as scheduled.

## 2018-09-04 NOTE — Progress Notes (Signed)
[place] [place], [place] [address]   CLINIC:  Medical Oncology/Hematology  PCP:  [name], MD [address] [place] [id number] [phone]   REASON FOR VISIT: Follow-up for right breast cancer, ER-/PR-/HER2+  CURRENT THERAPY: Paclitaxel and Herceptin   BRIEF ONCOLOGIC HISTORY:    Malignant neoplasm of upper-outer quadrant of right breast in female, estrogen receptor negative ([name])   07/03/2018 Initial Diagnosis    Palpable right breast mass for several months with skin discoloration, clinically skin trabecular thickening with nipple retraction measuring 7.2 x 4.3 x 4.2 cm, additional mass 4 o'clock position 2.6 cm, right axillary tail 1.2 cm right axillary lymph node 1.8 cm left breast irregular mass 1 cm, adjacent mass 0.9 cm together measuring 1.9 cm, no lymphadenopathy    07/03/2018 Pathology Results    Right breast biopsy: IDC grade 2, ER 0%, PR 0%, HER-2 positive, Ki-67 40%, lymph node biopsy negative, left breast biopsy complex sclerosing lesion     07/15/2018 Cancer Staging    Staging form: Breast, AJCC 8th Edition - Clinical: Stage IIB (cT3, cN0, cM0, G2, ER-, PR-, HER2+) - Signed by [name], MD on 07/15/2018    07/29/2018 -  Chemotherapy    The patient had trastuzumab (HERCEPTIN) 336 mg in sodium chloride 0.9 % 250 mL chemo infusion, 4 mg/kg = 336 mg, Intravenous,  Once, 2 of 3 cycles Administration: 336 mg (07/31/2018), 168 mg (08/07/2018), 168 mg (08/28/2018), 168 mg (08/14/2018), 168 mg (08/21/2018), 168 mg (09/04/2018) PACLitaxel (TAXOL) 78 mg in sodium chloride 0.9 % 250 mL chemo infusion (</= 36m/m2), 40 mg/m2 = 78 mg (50 % of original dose 80 mg/m2), Intravenous,  Once, 2 of 3 cycles Dose modification: 40 mg/m2 (50 % of original dose 80 mg/m2, Cycle 1, Reason: Patient Age), 53.3333 mg/m2 (66.7 % of original dose 80 mg/m2, Cycle 1, Reason: Provider Judgment) Administration: 78 mg (07/31/2018), 102 mg (08/07/2018), 102 mg  (08/28/2018), 102 mg (08/14/2018), 102 mg (08/21/2018), 102 mg (09/04/2018)  for chemotherapy treatment.       CANCER STAGING: Cancer Staging Malignant neoplasm of upper-outer quadrant of right breast in female, estrogen receptor negative ([place] Staging form: Breast, AJCC 8th Edition - Clinical: Stage IIB (cT3, cN0, cM0, G2, ER-, PR-, HER2+) - Signed by GNicholas Lose MD on 07/15/2018    INTERVAL HISTORY:  [name] female returns for routine follow-up for right breast cancer. Patient is here today with her husband. She has a bilateral rash to her lower extremities. She is otherwise doing well and tolerating treatment well. She has no other complaints at this time. She denies any numbness or tingling to her hands or feet. Denies any SOB or chest pain. Denies any nausea, vomiting, or diarrhea. Denies any fevers or recent infections. She reports her appetite at 100% and has no problem maintaining her weight. Her energy level is 50%.    REVIEW OF SYSTEMS:  Review of Systems  Constitutional: Positive for fatigue.  Skin: Positive for rash.  All other systems reviewed and are negative.    PAST MEDICAL/SURGICAL HISTORY:  Past Medical History:  Diagnosis Date  . [place]   right breast cancer  . GERD (gastroesophageal reflux disease)    occasional   . Hyperlipidemia   . Hypertension    clearance with note Dr [place] chart  . Pneumonia    2 years ago/ states occ cough with sinus drainage- no fever  . Thyroid nodule  with biopsy- states following medically   Past Surgical History:  Procedure Laterality Date  . CATARACT EXTRACTION W/PHACO  11/30/2012   Procedure: CATARACT EXTRACTION PHACO AND INTRAOCULAR LENS PLACEMENT (IOC);  Surgeon: Williams Che, MD;  Location: [facility];  Service: Ophthalmology;  Laterality: Left;  CDE:11.49  . CATARACT EXTRACTION W/PHACO Right 03/15/2013   Procedure: CATARACT EXTRACTION PHACO AND INTRAOCULAR LENS PLACEMENT (IOC);  Surgeon:  Williams Che, MD;  Location: [facility];  Service: Ophthalmology;  Laterality: Right;  CDE 16.15  . COLONOSCOPY    . EXCISION OF SKIN TAG Right 06/17/2016   Procedure: SHAVE OF SKIN TAG RIGHT BUTTOCK;  Surgeon: Aviva Signs, MD;  Location: [facility];  Service: General;  Laterality: Right;  . JOINT REPLACEMENT     left knee  . MASS EXCISION Left 06/17/2016   Procedure: EXCISION SKIN MALIGNANT LESION LEFT BUTTOCK;  Surgeon: Aviva Signs, MD;  Location: [facility];  Service: General;  Laterality: Left;  . PORTACATH PLACEMENT Right 07/28/2018   Procedure: INSERTION PORT-A-CATH WITH ULTRASOUND;  Surgeon: Rolm Bookbinder, MD;  Location: [facility];  Service: General;  Laterality: Right;  . PUNCH BIOPSY OF SKIN Right 07/28/2018   Procedure: PUNCH BIOPSY OF SKIN RIGHT BREAST;  Surgeon: Rolm Bookbinder, MD;  Location: [facility];  Service: General;  Laterality: Right;  . TOTAL KNEE ARTHROPLASTY  12/16/2011   Procedure: TOTAL KNEE ARTHROPLASTY;  Surgeon: Gearlean Alf, MD;  Location: [facility];  Service: Orthopedics;  Laterality: Right;  . TUBAL LIGATION       SOCIAL HISTORY:  Social History   Socioeconomic History  . Marital status: Married    Spouse name: Not on file  . Number of children: 2  . Years of education: some business school after HS  . Highest education level: Not on file  Occupational History  . Occupation: Cabin crew  . Occupation: Network engineer   Social Needs  . Financial resource strain: Not hard at all  . Food insecurity:    Worry: Never true    Inability: Never true  . Transportation needs:    Medical: No    Non-medical: No  Tobacco Use  . Smoking status: Never Smoker  . Smokeless tobacco: Never Used  Substance and Sexual Activity  . Alcohol use: No    Frequency: Never  . Drug use: No  . Sexual activity: Yes    Birth control/protection: None  Lifestyle  . Physical activity:    Days per week: 0 days    Minutes per session: 0 min  . Stress:  Not at all  Relationships  . Social connections:    Talks on phone: More than three times a week    Gets together: More than three times a week    Attends religious service: More than 4 times per year    Active member of club or organization: Yes    Attends meetings of clubs or organizations: Never    Relationship status: Married  . Intimate partner violence:    Fear of current or ex partner: No    Emotionally abused: No    Physically abused: No    Forced sexual activity: No  Other Topics Concern  . Not on file  Social History Narrative   Lives at home with her husband.   4 cups caffeine per day.   Right-handed.    FAMILY HISTORY:  Family History  Problem Relation Age of Onset  . Heart attack Mother   . Bronchitis Father   .  Diabetes Sister   . Leukemia Brother   . Lung disease Brother   . Lung disease Sister     CURRENT MEDICATIONS:  Outpatient Encounter Medications as of 09/04/2018  Medication Sig  . acetaminophen (TYLENOL) 325 MG tablet Take 650 mg by mouth every 6 (six) hours as needed.  Marland Kitchen aspirin 325 MG tablet Take 162.5 mg by mouth daily.   . cetirizine (ZYRTEC) 10 MG tablet Take 10 mg by mouth daily.  Marland Kitchen dexamethasone (DECADRON) 2 MG tablet Take 1 tablet (2 mg total) by mouth 2 (two) times daily.  . diphenhydrAMINE (BENADRYL) 25 MG tablet Take 25 mg by mouth at bedtime as needed.  . hydrocortisone 1 % ointment Apply 1 application topically 2 (two) times daily.  Marland Kitchen lidocaine-prilocaine (EMLA) cream Apply to port site one hour prior to appointment and cover with plastic wrap.  . losartan (COZAAR) 100 MG tablet Take 100 mg by mouth daily.   . Multiple Vitamin (MULTIVITAMIN WITH MINERALS) TABS tablet Take 1 tablet by mouth daily.  . Omega-3 Fatty Acids (FISH OIL PO) Take 2 capsules by mouth daily.   Marland Kitchen PACLitaxel (TAXOL IV) Inject into the vein.  . pantoprazole (PROTONIX) 40 MG tablet Take 40 mg by mouth daily.  . prochlorperazine (COMPAZINE) 10 MG tablet Take 1 tablet  (10 mg total) by mouth every 6 (six) hours as needed (Nausea or vomiting).  . simvastatin (ZOCOR) 10 MG tablet Take 10 mg by mouth daily.  . traMADol (ULTRAM) 50 MG tablet Take 1 tablet (50 mg total) by mouth every 6 (six) hours as needed.  . Trastuzumab (HERCEPTIN IV) Inject into the vein.   No facility-administered encounter medications on file as of 09/04/2018.     ALLERGIES:  Allergies  Allergen Reactions  . Penicillins Rash    Has patient had a PCN reaction causing immediate rash, facial/tongue/throat swelling, SOB or lightheadedness with hypotension: No Has patient had a PCN reaction causing severe rash involving mucus membranes or skin necrosis: No Has patient had a PCN reaction that required hospitalization: No Has patient had a PCN reaction occurring within the last 10 years: No If all of the above answers are "NO", then may proceed with Cephalosporin use.      PHYSICAL EXAM:  ECOG Performance status: 1  Vitals:   09/04/18 0920  BP: (!) 152/79  Pulse: 100  Resp: 18  Temp: 98.1 F (36.7 C)  SpO2: 100%   Filed Weights   09/04/18 0920  Weight: 180 lb (81.6 kg)    Physical Exam  Constitutional: She is oriented to person, place, and time. She appears well-developed and well-nourished.  Cardiovascular: Normal rate, regular rhythm and normal heart sounds.  Pulmonary/Chest: Effort normal and breath sounds normal.  Musculoskeletal: Normal range of motion.  Neurological: She is alert and oriented to person, place, and time.  Skin: Skin is warm and dry.  Psychiatric: She has a normal mood and affect. Her behavior is normal. Judgment and thought content normal.     LABORATORY DATA:  I have reviewed the labs as listed.  CBC    Component Value Date/Time   WBC 8.5 09/04/2018 0903   RBC 3.54 (L) 09/04/2018 0903   HGB 10.2 (L) 09/04/2018 0903   HGB 12.7 11/24/2017 1626   HCT 31.4 (L) 09/04/2018 0903   HCT 36.6 11/24/2017 1626   PLT 451 (H) 09/04/2018 0903   PLT 420  (H) 11/24/2017 1626   MCV 88.7 09/04/2018 0903   MCV 88 11/24/2017 1626  MCH 28.8 09/04/2018 0903   MCHC 32.5 09/04/2018 0903   RDW 15.5 09/04/2018 0903   RDW 15.4 11/24/2017 1626   LYMPHSABS 2.2 09/04/2018 0903   LYMPHSABS 2.5 11/24/2017 1626   MONOABS 0.7 09/04/2018 0903   EOSABS 0.2 09/04/2018 0903   EOSABS 0.1 11/24/2017 1626   BASOSABS 0.1 09/04/2018 0903   BASOSABS 0.0 11/24/2017 1626   CMP Latest Ref Rng & Units 09/04/2018 08/28/2018 08/21/2018  Glucose 70 - 99 mg/dL 124(H) 119(H) 104(H)  BUN 8 - 23 mg/dL _0 Creatinine 0.44 - 1.00 mg/dL 1.12(H) 1.17(H) 1.20(H)  Sodium 135 - 145 mmol/L 127(L) 131(L) 131(L)  Potassium 3.5 - 5.1 mmol/L 4.3 4.3 4.0  Chloride 98 - 111 mmol/L 94(L) 98 98  CO2 22 - 32 mmol/L _1 Calcium 8.9 - 10.3 mg/dL 9.1 9.0 8.8(L)  Total Protein 6.5 - 8.1 g/dL 7.0 7.0 6.6  Total Bilirubin 0.3 - 1.2 mg/dL 0.6 0.5 0.5  Alkaline Phos 38 - 126 U/L 78 77 77  AST 15 - 41 U/L _2 ALT 0 - 44 U/L 32 27 23          ASSESSMENT & PLAN:   Malignant neoplasm of upper-outer quadrant of right breast in female, estrogen receptor negative (HCC) 1.  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 - Right breast biopsy at 11:30 position on 07/03/2018 shows invasive ductal carcinoma, ER/PR negative, HER-2 positive, Ki-67 of 40%, right axillary lymph node biopsy shows benign tissue, left breast core needle biopsy at 230 o'clock position shows complex sclerosing lesion - CT chest abdomen and pelvis on 07/16/2018 showed a 6 mm nodule in the  posterior left lower lobe with no other evidence of metastatic disease. -Bone scan on 07/16/2018 shows focal area of increased activity over the distal left clavicle/shoulder consistent with degenerative changes. - Skin punch biopsy of the right breast on 07/28/2018 was positive for dermal lymphatic invasion. -Echo on 07/27/2018 shows ejection fraction of 55 to 60%. -She was started on weekly paclitaxel and Herceptin on 07/31/2018. - Week 5 treatment was on 08/28/2018.  She is continuing to tolerate it very well.  She has mild fatigue. - She may proceed with week 6 of treatment today.  She will continue with the same dose. - Physical examination today shows decreased erythema, with erythema restricted to the upper quadrant of the breast.  Mass has decreased in size.  2.  Taxane induced skin rash: - She has developed erythematous maculopapular rash for the last 1 week on the lower extremities, predominantly on the shins.  She complains of itching. - We will start her on dexamethasone 2 mg twice daily for the next 2 days.  She will also be given hydrocortisone 1% cream twice daily to help with itching.      Orders placed this encounter:  No orders of the defined types were placed in this encounter.     SDerek Jack MD [id number]

## 2018-09-04 NOTE — Assessment & Plan Note (Signed)
1.  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 - Right breast biopsy at 11:30 position on 07/03/2018 shows invasive ductal carcinoma, ER/PR negative, HER-2 positive, Ki-67 of 40%, right axillary lymph node biopsy shows benign tissue, left breast core needle biopsy at 230 o'clock position shows complex sclerosing lesion - CT chest abdomen and pelvis on 07/16/2018 showed a 6 mm nodule in the posterior left lower lobe with no other evidence of metastatic disease. -Bone scan on 07/16/2018 shows focal area of increased activity over the distal left clavicle/shoulder consistent with degenerative changes. - Skin punch biopsy of the right breast on 07/28/2018 was positive for dermal lymphatic invasion. -Echo on 07/27/2018 shows ejection fraction of 55 to 60%. -She was started on weekly paclitaxel and Herceptin on 07/31/2018. - Week 5 treatment was on 08/28/2018.  She is continuing to tolerate it very well.  She has mild fatigue. - She may proceed with week 6 of treatment today.  She will continue with the same dose. - Physical examination today shows decreased erythema, with erythema restricted to the upper quadrant of the breast.  Mass has decreased in size.  2.  Taxane induced skin rash: - She has developed erythematous maculopapular rash for the last 1 week on the lower extremities, predominantly on the shins.  She complains of itching. - We will start her on dexamethasone 2 mg twice daily for the next 2 days.  She will also be given hydrocortisone 1% cream twice daily to help with  itching.

## 2018-09-04 NOTE — Patient Instructions (Signed)
[facility] Discharge Instructions for Patients Receiving Chemotherapy  Today you received the following chemotherapy agents   To help prevent nausea and vomiting after your treatment, we encourage you to take your nausea medication   If you develop nausea and vomiting that is not controlled by your nausea medication, call the clinic.   BELOW ARE SYMPTOMS THAT SHOULD BE REPORTED IMMEDIATELY:  *FEVER GREATER THAN 100.5 F  *CHILLS WITH OR WITHOUT FEVER  NAUSEA AND VOMITING THAT IS NOT CONTROLLED WITH YOUR NAUSEA MEDICATION  *UNUSUAL SHORTNESS OF BREATH  *UNUSUAL BRUISING OR BLEEDING  TENDERNESS IN MOUTH AND THROAT WITH OR WITHOUT PRESENCE OF ULCERS  *URINARY PROBLEMS  *BOWEL PROBLEMS  UNUSUAL RASH Items with * indicate a potential emergency and should be followed up as soon as possible.  Feel free to call the clinic should you have any questions or concerns. The clinic phone number is [phone].  Please show the CHEMO ALERT CARD at check-in to the Emergency Department and triage nurse.   

## 2018-09-11 ENCOUNTER — Other Ambulatory Visit: Payer: Self-pay

## 2018-09-11 ENCOUNTER — Inpatient Hospital Stay (HOSPITAL_COMMUNITY): Payer: Medicare HMO

## 2018-09-11 ENCOUNTER — Inpatient Hospital Stay (HOSPITAL_COMMUNITY): Payer: Medicare HMO | Attending: Hematology | Admitting: Hematology

## 2018-09-11 ENCOUNTER — Encounter (HOSPITAL_COMMUNITY): Payer: Self-pay | Admitting: Hematology

## 2018-09-11 VITALS — BP 114/54 | HR 91 | Temp 97.6°F | Resp 18 | Wt 176.9 lb

## 2018-09-11 DIAGNOSIS — E785 Hyperlipidemia, unspecified: Secondary | ICD-10-CM | POA: Diagnosis not present

## 2018-09-11 DIAGNOSIS — R531 Weakness: Secondary | ICD-10-CM | POA: Diagnosis not present

## 2018-09-11 DIAGNOSIS — R197 Diarrhea, unspecified: Secondary | ICD-10-CM

## 2018-09-11 DIAGNOSIS — Z7982 Long term (current) use of aspirin: Secondary | ICD-10-CM | POA: Diagnosis not present

## 2018-09-11 DIAGNOSIS — Z79899 Other long term (current) drug therapy: Secondary | ICD-10-CM | POA: Diagnosis not present

## 2018-09-11 DIAGNOSIS — C50411 Malignant neoplasm of upper-outer quadrant of right female breast: Secondary | ICD-10-CM | POA: Diagnosis not present

## 2018-09-11 DIAGNOSIS — R21 Rash and other nonspecific skin eruption: Secondary | ICD-10-CM

## 2018-09-11 DIAGNOSIS — Z806 Family history of leukemia: Secondary | ICD-10-CM | POA: Insufficient documentation

## 2018-09-11 DIAGNOSIS — Z171 Estrogen receptor negative status [ER-]: Principal | ICD-10-CM

## 2018-09-11 DIAGNOSIS — Z5111 Encounter for antineoplastic chemotherapy: Secondary | ICD-10-CM | POA: Diagnosis not present

## 2018-09-11 DIAGNOSIS — R5383 Other fatigue: Secondary | ICD-10-CM | POA: Diagnosis not present

## 2018-09-11 DIAGNOSIS — I10 Essential (primary) hypertension: Secondary | ICD-10-CM | POA: Diagnosis not present

## 2018-09-11 DIAGNOSIS — Z5112 Encounter for antineoplastic immunotherapy: Secondary | ICD-10-CM

## 2018-09-11 DIAGNOSIS — K219 Gastro-esophageal reflux disease without esophagitis: Secondary | ICD-10-CM | POA: Diagnosis not present

## 2018-09-11 LAB — COMPREHENSIVE METABOLIC PANEL
ALT: 30 U/L (ref 0–44)
ANION GAP: 9 (ref 5–15)
AST: 26 U/L (ref 15–41)
Albumin: 3.3 g/dL — ABNORMAL LOW (ref 3.5–5.0)
Alkaline Phosphatase: 72 U/L (ref 38–126)
BILIRUBIN TOTAL: 0.8 mg/dL (ref 0.3–1.2)
BUN: 20 mg/dL (ref 8–23)
CO2: 23 mmol/L (ref 22–32)
Calcium: 8.9 mg/dL (ref 8.9–10.3)
Chloride: 98 mmol/L (ref 98–111)
Creatinine, Ser: 1.23 mg/dL — ABNORMAL HIGH (ref 0.44–1.00)
GFR, EST AFRICAN AMERICAN: 45 mL/min — AB (ref >60)
GFR, EST NON AFRICAN AMERICAN: 39 mL/min — AB (ref >60)
Glucose, Bld: 125 mg/dL — ABNORMAL HIGH (ref 70–99)
Potassium: 4.8 mmol/L (ref 3.5–5.1)
Sodium: 130 mmol/L — ABNORMAL LOW (ref 135–145)
TOTAL PROTEIN: 6.6 g/dL (ref 6.5–8.1)

## 2018-09-11 LAB — CBC WITH DIFFERENTIAL/PLATELET
Abs Immature Granulocytes: 0.09 10*3/uL — ABNORMAL HIGH (ref 0.00–0.07)
BASOS PCT: 1 %
Basophils Absolute: 0 10*3/uL (ref 0.0–0.1)
EOS PCT: 1 %
Eosinophils Absolute: 0.1 10*3/uL (ref 0.0–0.5)
HEMATOCRIT: 30.3 % — AB (ref 36.0–46.0)
Hemoglobin: 10.1 g/dL — ABNORMAL LOW (ref 12.0–15.0)
Immature Granulocytes: 1 %
LYMPHS PCT: 22 %
Lymphs Abs: 1.7 10*3/uL (ref 0.7–4.0)
MCH: 30.3 pg (ref 26.0–34.0)
MCHC: 33.3 g/dL (ref 30.0–36.0)
MCV: 91 fL (ref 80.0–100.0)
MONOS PCT: 9 %
Monocytes Absolute: 0.7 10*3/uL (ref 0.1–1.0)
NEUTROS ABS: 5.3 10*3/uL (ref 1.7–7.7)
NEUTROS PCT: 66 %
NRBC: 0 % (ref 0.0–0.2)
PLATELETS: 475 10*3/uL — AB (ref 150–400)
RBC: 3.33 MIL/uL — ABNORMAL LOW (ref 3.87–5.11)
RDW: 15.9 % — AB (ref 11.5–15.5)
WBC: 7.9 10*3/uL (ref 4.0–10.5)

## 2018-09-11 MED ORDER — ACETAMINOPHEN 325 MG PO TABS
650.0000 mg | ORAL_TABLET | Freq: Once | ORAL | Status: AC
  Administered 2018-09-11: 650 mg via ORAL
  Filled 2018-09-11: qty 2

## 2018-09-11 MED ORDER — HEPARIN SOD (PORK) LOCK FLUSH 100 UNIT/ML IV SOLN
INTRAVENOUS | Status: AC
  Filled 2018-09-11: qty 5

## 2018-09-11 MED ORDER — DIPHENHYDRAMINE HCL 50 MG/ML IJ SOLN: 50.0000 mg | Freq: Once | INTRAMUSCULAR | Status: DC

## 2018-09-11 MED ORDER — SODIUM CHLORIDE 0.9 % IV SOLN
Freq: Once | INTRAVENOUS | Status: AC
  Administered 2018-09-11: 12:00:00 via INTRAVENOUS

## 2018-09-11 MED ORDER — TRASTUZUMAB CHEMO 150 MG IV SOLR
2.0000 mg/kg | Freq: Once | INTRAVENOUS | Status: AC
  Administered 2018-09-11: 168 mg via INTRAVENOUS
  Filled 2018-09-11: qty 8

## 2018-09-11 MED ORDER — HEPARIN SOD (PORK) LOCK FLUSH 100 UNIT/ML IV SOLN
500.0000 [IU] | Freq: Once | INTRAVENOUS | Status: AC
  Administered 2018-09-11: 500 [IU] via INTRAVENOUS

## 2018-09-11 MED ORDER — SODIUM CHLORIDE 0.9% FLUSH
10.0000 mL | Freq: Once | INTRAVENOUS | Status: AC
  Administered 2018-09-11: 10 mL

## 2018-09-11 MED ORDER — DIPHENHYDRAMINE HCL 25 MG PO CAPS
25.0000 mg | ORAL_CAPSULE | Freq: Once | ORAL | Status: AC
  Administered 2018-09-11: 25 mg via ORAL
  Filled 2018-09-11: qty 1

## 2018-09-11 NOTE — Patient Instructions (Signed)
[facility] Discharge Instructions for Patients Receiving Chemotherapy  Today you received the following chemotherapy agents   To help prevent nausea and vomiting after your treatment, we encourage you to take your nausea medication   If you develop nausea and vomiting that is not controlled by your nausea medication, call the clinic.   BELOW ARE SYMPTOMS THAT SHOULD BE REPORTED IMMEDIATELY:  *FEVER GREATER THAN 100.5 F  *CHILLS WITH OR WITHOUT FEVER  NAUSEA AND VOMITING THAT IS NOT CONTROLLED WITH YOUR NAUSEA MEDICATION  *UNUSUAL SHORTNESS OF BREATH  *UNUSUAL BRUISING OR BLEEDING  TENDERNESS IN MOUTH AND THROAT WITH OR WITHOUT PRESENCE OF ULCERS  *URINARY PROBLEMS  *BOWEL PROBLEMS  UNUSUAL RASH Items with * indicate a potential emergency and should be followed up as soon as possible.  Feel free to call the clinic should you have any questions or concerns. The clinic phone number is [phone].  Please show the CHEMO ALERT CARD at check-in to the Emergency Department and triage nurse.   

## 2018-09-11 NOTE — Progress Notes (Signed)
[name], [name] [id number]   CLINIC:  Medical Oncology/Hematology  PCP:  Adrienne Squibb, MD [address] [place] [address] [phone]   REASON FOR VISIT: Follow-up for right breast cancer, ER-/PR-/HER2+  CURRENT THERAPY: Paclitaxel and Herceptin   BRIEF ONCOLOGIC HISTORY:    Malignant neoplasm of upper-outer quadrant of right breast in female, estrogen receptor negative ([place])   07/03/2018 Initial Diagnosis    Palpable right breast mass for several months with skin discoloration, clinically skin trabecular thickening with nipple retraction measuring 7.2 x 4.3 x 4.2 cm, additional mass 4 o'clock position 2.6 cm, right axillary tail 1.2 cm right axillary lymph node 1.8 cm left breast irregular mass 1 cm, adjacent mass 0.9 cm together measuring 1.9 cm, no lymphadenopathy    07/03/2018 Pathology Results    Right breast biopsy: IDC grade 2, ER 0%, PR 0%, HER-2 positive, Ki-67 40%, lymph node biopsy negative, left breast biopsy complex sclerosing lesion     07/15/2018 Cancer Staging    Staging form: Breast, AJCC 8th Edition - Clinical: Stage IIB (cT3, cN0, cM0, G2, ER-, PR-, HER2+) - Signed by Adrienne Lose, MD on 07/15/2018    07/29/2018 -  Chemotherapy    The patient had trastuzumab (HERCEPTIN) 336 mg in sodium chloride 0.9 % 250 mL chemo infusion, 4 mg/kg = 336 mg, Intravenous,  Once, 2 of 3 cycles Administration: 336 mg (07/31/2018), 168 mg (08/07/2018), 168 mg (08/28/2018), 168 mg (08/14/2018), 168 mg (08/21/2018), 168 mg (09/04/2018) PACLitaxel (TAXOL) 78 mg in sodium chloride 0.9 % 250 mL chemo infusion (</= 51m/m2), 40 mg/m2 = 78 mg (50 % of original dose 80 mg/m2), Intravenous,  Once, 2 of 3 cycles Dose modification: 40 mg/m2 (50 % of original dose 80 mg/m2, Cycle 1, Reason: Patient Age), 53.3333 mg/m2 (66.7 % of original dose 80 mg/m2, Cycle 1, Reason: Provider Judgment) Administration: 78 mg (07/31/2018), 102 mg (08/07/2018), 102 mg  (08/28/2018), 102 mg (08/14/2018), 102 mg (08/21/2018), 102 mg (09/04/2018)  for chemotherapy treatment.       CANCER STAGING: Cancer Staging Malignant neoplasm of upper-outer quadrant of right breast in female, estrogen receptor negative ([place] Staging form: Breast, AJCC 8th Edition - Clinical: Stage IIB (cT3, cN0, cM0, G2, ER-, PR-, HER2+) - Signed by Adrienne Lose MD on 07/15/2018    INTERVAL HISTORY:  [name].o. female returns for follow-up of breast cancer and next treatment.  This Wednesday, she was so weak that she had to sit while in the shower.  Denied any nausea or vomiting.  Took steroid pills as prescribed last week after treatment for this rash.  Patient reports that itching is helped by steroid cream.  Denied any diarrhea or constipation.  No fevers or infections noted.  Appetite has been good.  Energy levels are 50%.   REVIEW OF SYSTEMS:  Review of Systems  Constitutional: Positive for fatigue.  Skin: Positive for rash.  All other systems reviewed and are negative.    PAST MEDICAL/SURGICAL HISTORY:  Past Medical History:  Diagnosis Date  . Cancer ([place] 06/[place]   right breast cancer  . GERD (gastroesophageal reflux disease)    occasional   . Hyperlipidemia   . Hypertension    clearance with note Dr [place] chart  . Pneumonia    2 years ago/ states occ cough with sinus drainage- no fever  . Thyroid nodule    with biopsy- states following medically   Past Surgical History:  Procedure Laterality Date  . CATARACT EXTRACTION W/PHACO  11/30/2012   Procedure: CATARACT EXTRACTION PHACO AND INTRAOCULAR LENS PLACEMENT (IOC);  Surgeon: Williams Che, MD;  Location: [facility];  Service: Ophthalmology;  Laterality: Left;  CDE:11.49  . CATARACT EXTRACTION W/PHACO Right 03/15/2013   Procedure: CATARACT EXTRACTION PHACO AND INTRAOCULAR LENS PLACEMENT (IOC);  Surgeon: Williams Che, MD;  Location: [facility];  Service: Ophthalmology;  Laterality: Right;  CDE 16.15  .  COLONOSCOPY    . EXCISION OF SKIN TAG Right 06/17/2016   Procedure: SHAVE OF SKIN TAG RIGHT BUTTOCK;  Surgeon: Aviva Signs, MD;  Location: [facility];  Service: General;  Laterality: Right;  . JOINT REPLACEMENT     left knee  . MASS EXCISION Left 06/17/2016   Procedure: EXCISION SKIN MALIGNANT LESION LEFT BUTTOCK;  Surgeon: Aviva Signs, MD;  Location: [facility];  Service: General;  Laterality: Left;  . PORTACATH PLACEMENT Right 07/28/2018   Procedure: INSERTION PORT-A-CATH WITH ULTRASOUND;  Surgeon: Rolm Bookbinder, MD;  Location: [facility];  Service: General;  Laterality: Right;  . PUNCH BIOPSY OF SKIN Right 07/28/2018   Procedure: PUNCH BIOPSY OF SKIN RIGHT BREAST;  Surgeon: Rolm Bookbinder, MD;  Location: [facility];  Service: General;  Laterality: Right;  . TOTAL KNEE ARTHROPLASTY  12/16/2011   Procedure: TOTAL KNEE ARTHROPLASTY;  Surgeon: Gearlean Alf, MD;  Location: [facility];  Service: Orthopedics;  Laterality: Right;  . TUBAL LIGATION       SOCIAL HISTORY:  Social History   Socioeconomic History  . Marital status: Married    Spouse name: Not on file  . Number of children: 2  . Years of education: some business school after HS  . Highest education level: Not on file  Occupational History  . Occupation: Cabin crew  . Occupation: Network engineer   Social Needs  . Financial resource strain: Not hard at all  . Food insecurity:    Worry: Never true    Inability: Never true  . Transportation needs:    Medical: No    Non-medical: No  Tobacco Use  . Smoking status: Never Smoker  . Smokeless tobacco: Never Used  Substance and Sexual Activity  . Alcohol use: No    Frequency: Never  . Drug use: No  . Sexual activity: Yes    Birth control/protection: None  Lifestyle  . Physical activity:    Days per week: 0 days    Minutes per session: 0 min  . Stress: Not at all  Relationships  . Social connections:    Talks on phone: More than three times a week     Gets together: More than three times a week    Attends religious service: More than 4 times per year    Active member of club or organization: Yes    Attends meetings of clubs or organizations: Never    Relationship status: Married  . Intimate partner violence:    Fear of current or ex partner: No    Emotionally abused: No    Physically abused: No    Forced sexual activity: No  Other Topics Concern  . Not on file  Social History Narrative   Lives at home with her husband.   4 cups caffeine per day.   Right-handed.    FAMILY HISTORY:  Family History  Problem Relation Age of Onset  . Heart attack Mother   . Bronchitis Father   . Diabetes Sister   . Leukemia Brother   .  Lung disease Brother   . Lung disease Sister     CURRENT MEDICATIONS:  Outpatient Encounter Medications as of 09/11/2018  Medication Sig  . acetaminophen (TYLENOL) 325 MG tablet Take 650 mg by mouth every 6 (six) hours as needed.  Marland Kitchen aspirin 325 MG tablet Take 162.5 mg by mouth daily.   . cetirizine (ZYRTEC) 10 MG tablet Take 10 mg by mouth daily.  Marland Kitchen dexamethasone (DECADRON) 2 MG tablet Take 1 tablet (2 mg total) by mouth 2 (two) times daily.  . diphenhydrAMINE (BENADRYL) 25 MG tablet Take 25 mg by mouth at bedtime as needed.  . hydrocortisone 1 % ointment Apply 1 application topically 2 (two) times daily.  Marland Kitchen lidocaine-prilocaine (EMLA) cream Apply to port site one hour prior to appointment and cover with plastic wrap.  . losartan (COZAAR) 100 MG tablet Take 100 mg by mouth daily.   . Multiple Vitamin (MULTIVITAMIN WITH MINERALS) TABS tablet Take 1 tablet by mouth daily.  . Omega-3 Fatty Acids (FISH OIL PO) Take 2 capsules by mouth daily.   Marland Kitchen PACLitaxel (TAXOL IV) Inject into the vein.  . pantoprazole (PROTONIX) 40 MG tablet Take 40 mg by mouth daily.  . prochlorperazine (COMPAZINE) 10 MG tablet Take 1 tablet (10 mg total) by mouth every 6 (six) hours as needed (Nausea or vomiting).  . simvastatin (ZOCOR) 10  MG tablet Take 10 mg by mouth daily.  . traMADol (ULTRAM) 50 MG tablet Take 1 tablet (50 mg total) by mouth every 6 (six) hours as needed.  . Trastuzumab (HERCEPTIN IV) Inject into the vein.   No facility-administered encounter medications on file as of 09/11/2018.     ALLERGIES:  Allergies  Allergen Reactions  . Penicillins Rash    Has patient had a PCN reaction causing immediate rash, facial/tongue/throat swelling, SOB or lightheadedness with hypotension: No Has patient had a PCN reaction causing severe rash involving mucus membranes or skin necrosis: No Has patient had a PCN reaction that required hospitalization: No Has patient had a PCN reaction occurring within the last 10 years: No If all of the above answers are "NO", then may proceed with Cephalosporin use.      PHYSICAL EXAM:  ECOG Performance status: 1  I have reviewed her vitals.  Blood pressure is 140/64 pulse is 110.  Respiratory rate is 18 temperature is 98.6 saturations are 99%. Physical Exam  Constitutional: She is oriented to person, place, and time. She appears well-developed and well-nourished.  Cardiovascular: Normal rate, regular rhythm and normal heart sounds.  Pulmonary/Chest: Effort normal and breath sounds normal.  Musculoskeletal: Normal range of motion.  Neurological: She is alert and oriented to person, place, and time.  Skin: Skin is warm and dry.  Psychiatric: She has a normal mood and affect. Her behavior is normal. Judgment and thought content normal.     LABORATORY DATA:  I have reviewed the labs as listed.  CBC    Component Value Date/Time   WBC 7.9 09/11/2018 1032   RBC 3.33 (L) 09/11/2018 1032   HGB 10.1 (L) 09/11/2018 1032   HGB 12.7 11/24/2017 1626   HCT 30.3 (L) 09/11/2018 1032   HCT 36.6 11/24/2017 1626   PLT 475 (H) 09/11/2018 1032   PLT 420 (H) 11/24/2017 1626   MCV 91.0 09/11/2018 1032   MCV 88 11/24/2017 1626   MCH 30.3 09/11/2018 1032   MCHC 33.3 09/11/2018 1032   RDW  15.9 (H) 09/11/2018 1032   RDW 15.4 11/24/2017 1626   LYMPHSABS  1.7 09/11/2018 1032   LYMPHSABS 2.5 11/24/2017 1626   MONOABS 0.7 09/11/2018 1032   EOSABS 0.1 09/11/2018 1032   EOSABS 0.1 11/24/2017 1626   BASOSABS 0.0 09/11/2018 1032   BASOSABS 0.0 11/24/2017 1626   CMP Latest Ref Rng & Units 09/11/2018 09/04/2018 08/28/2018  Glucose 70 - 99 mg/dL 125(H) 124(H) 119(H)  BUN 8 - 23 mg/dL 20 15 14   Creatinine 0.44 - 1.00 mg/dL 1.23(H) 1.12(H) 1.17(H)  Sodium 135 - 145 mmol/L 130(L) 127(L) 131(L)  Potassium 3.5 - 5.1 mmol/L 4.8 4.3 4.3  Chloride 98 - 111 mmol/L 98 94(L) 98  CO2 22 - 32 mmol/L 23 23 25   Calcium 8.9 - 10.3 mg/dL 8.9 9.1 9.0  Total Protein 6.5 - 8.1 g/dL 6.6 7.0 7.0  Total Bilirubin 0.3 - 1.2 mg/dL 0.8 0.6 0.5  Alkaline Phos 38 - 126 U/L 72 78 77  AST 15 - 41 U/L 26 28 27   ALT 0 - 44 U/L 30 32 27          ASSESSMENT & PLAN:   Malignant neoplasm of upper-outer quadrant of right breast in female, estrogen receptor negative ([facility]) 1.  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 - Right breast biopsy at 11:30 position on 07/03/2018 shows invasive ductal carcinoma, ER/PR negative, HER-2 positive, Ki-67 of 40%, right axillary lymph node biopsy shows benign tissue, left breast core needle biopsy at 230 o'clock position shows complex sclerosing lesion - CT chest abdomen and pelvis on 07/16/2018 showed a 6 mm nodule in the posterior left lower lobe with no other evidence of metastatic disease. -Bone scan on 07/16/2018 shows focal area of increased activity  over the distal left clavicle/shoulder consistent with degenerative changes. - Skin punch biopsy of the right breast on 07/28/2018 was positive for dermal lymphatic invasion. -Echo on 07/27/2018 shows ejection fraction of 55 to 60%. -She was started on weekly paclitaxel and Herceptin on 07/31/2018. - Week 6 treatment was on 09/04/2018. -She is complaining of worsening of her fatigue.  She felt severely fatigued on Wednesday.  Hence I have recommended to hold off on paclitaxel today, continue Herceptin.  We will reassess her next week and reintroduce paclitaxel if her tiredness improves.  2.  Taxane induced skin rash: - She has developed erythematous maculopapular rash on the extremities, predominantly on the shins after week 5 of treatment. - Today the rash is stable.  She is using hydrocortisone cream for control of itching.  She also took dexamethasone 2 mg twice daily for 2 days after last treatment.  Hopefully this will be better as she is not receiving paclitaxel today.      Orders placed this encounter:  Orders Placed This Encounter  Procedures  . Comprehensive metabolic panel  . CBC with Differential  . Magnesium      SDerek Jack MD [phone]

## 2018-09-11 NOTE — Progress Notes (Signed)
Pt here today to see Dr. [name]. VSS. Complaints of weakness today. Per MD hold Taxol at this time. Labs reviewed.   Per Alvester Chou, " hold 50mg  Benadryl IV and give 25mg  PO Benadryl."   Treatment given today per MD orders. Tolerated infusion without adverse affects. Vital signs stable. No complaints at this time. Discharged from clinic ambulatory. F/U with [facility] as scheduled.

## 2018-09-11 NOTE — Assessment & Plan Note (Signed)
1.  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 - Right breast biopsy at 11:30 position on 07/03/2018 shows invasive ductal carcinoma, ER/PR negative, HER-2 positive, Ki-67 of 40%, right axillary lymph node biopsy shows benign tissue, left breast core needle biopsy at 230 o'clock position shows complex sclerosing lesion - CT chest abdomen and pelvis on 07/16/2018 showed a 6 mm nodule in the posterior left lower lobe with no other evidence of metastatic disease. -Bone scan on 07/16/2018 shows focal area of increased activity over the distal left clavicle/shoulder consistent with degenerative changes. - Skin punch biopsy of the right breast on 07/28/2018 was positive for dermal lymphatic invasion. -Echo on 07/27/2018 shows ejection fraction of 55 to 60%. -She was started on weekly paclitaxel and Herceptin on 07/31/2018. - Week 6 treatment was on 09/04/2018. -She is complaining of worsening of her fatigue.  She felt severely fatigued on Wednesday.  Hence I have recommended to hold off on paclitaxel today, continue Herceptin.  We will reassess her next week and reintroduce paclitaxel if her tiredness improves.  2.  Taxane induced skin rash: - She has developed erythematous maculopapular rash on the extremities, predominantly on the shins after week 5 of treatment. - Today the rash is stable.  She is using hydrocortisone cream for control of itching.  She also took dexamethasone 2 mg twice daily for 2 days after last treatment.  Hopefully this will be better as she is not receiving  paclitaxel today.

## 2018-09-18 ENCOUNTER — Inpatient Hospital Stay (HOSPITAL_COMMUNITY): Payer: Medicare HMO

## 2018-09-18 ENCOUNTER — Encounter (HOSPITAL_COMMUNITY): Payer: Self-pay | Admitting: Hematology

## 2018-09-18 ENCOUNTER — Other Ambulatory Visit (HOSPITAL_COMMUNITY): Payer: Medicare HMO

## 2018-09-18 ENCOUNTER — Inpatient Hospital Stay (HOSPITAL_COMMUNITY): Payer: Medicare HMO | Attending: Hematology | Admitting: Hematology

## 2018-09-18 VITALS — BP 133/63 | HR 87 | Temp 98.0°F | Resp 18

## 2018-09-18 DIAGNOSIS — K219 Gastro-esophageal reflux disease without esophagitis: Secondary | ICD-10-CM | POA: Diagnosis not present

## 2018-09-18 DIAGNOSIS — R21 Rash and other nonspecific skin eruption: Secondary | ICD-10-CM | POA: Diagnosis not present

## 2018-09-18 DIAGNOSIS — Z171 Estrogen receptor negative status [ER-]: Secondary | ICD-10-CM | POA: Diagnosis not present

## 2018-09-18 DIAGNOSIS — C773 Secondary and unspecified malignant neoplasm of axilla and upper limb lymph nodes: Secondary | ICD-10-CM | POA: Diagnosis not present

## 2018-09-18 DIAGNOSIS — R197 Diarrhea, unspecified: Secondary | ICD-10-CM | POA: Diagnosis not present

## 2018-09-18 DIAGNOSIS — R5383 Other fatigue: Secondary | ICD-10-CM | POA: Diagnosis not present

## 2018-09-18 DIAGNOSIS — Z79899 Other long term (current) drug therapy: Secondary | ICD-10-CM

## 2018-09-18 DIAGNOSIS — Z7982 Long term (current) use of aspirin: Secondary | ICD-10-CM | POA: Diagnosis not present

## 2018-09-18 DIAGNOSIS — R531 Weakness: Secondary | ICD-10-CM

## 2018-09-18 DIAGNOSIS — E079 Disorder of thyroid, unspecified: Secondary | ICD-10-CM | POA: Insufficient documentation

## 2018-09-18 DIAGNOSIS — Z5112 Encounter for antineoplastic immunotherapy: Secondary | ICD-10-CM | POA: Diagnosis not present

## 2018-09-18 DIAGNOSIS — E785 Hyperlipidemia, unspecified: Secondary | ICD-10-CM | POA: Diagnosis not present

## 2018-09-18 DIAGNOSIS — Z5111 Encounter for antineoplastic chemotherapy: Secondary | ICD-10-CM

## 2018-09-18 DIAGNOSIS — C50411 Malignant neoplasm of upper-outer quadrant of right female breast: Secondary | ICD-10-CM

## 2018-09-18 DIAGNOSIS — Z806 Family history of leukemia: Secondary | ICD-10-CM

## 2018-09-18 DIAGNOSIS — I10 Essential (primary) hypertension: Secondary | ICD-10-CM | POA: Diagnosis not present

## 2018-09-18 LAB — COMPREHENSIVE METABOLIC PANEL
ALBUMIN: 3.3 g/dL — AB (ref 3.5–5.0)
ALK PHOS: 81 U/L (ref 38–126)
ALT: 27 U/L (ref 0–44)
ANION GAP: 9 (ref 5–15)
AST: 26 U/L (ref 15–41)
BUN: 14 mg/dL (ref 8–23)
CALCIUM: 9 mg/dL (ref 8.9–10.3)
CO2: 22 mmol/L (ref 22–32)
Chloride: 97 mmol/L — ABNORMAL LOW (ref 98–111)
Creatinine, Ser: 1.22 mg/dL — ABNORMAL HIGH (ref 0.44–1.00)
GFR calc Af Amer: 45 mL/min — ABNORMAL LOW (ref >60)
GFR calc non Af Amer: 39 mL/min — ABNORMAL LOW (ref >60)
Glucose, Bld: 114 mg/dL — ABNORMAL HIGH (ref 70–99)
POTASSIUM: 4.3 mmol/L (ref 3.5–5.1)
SODIUM: 128 mmol/L — AB (ref 135–145)
Total Bilirubin: 0.5 mg/dL (ref 0.3–1.2)
Total Protein: 6.7 g/dL (ref 6.5–8.1)

## 2018-09-18 LAB — MAGNESIUM: Magnesium: 1.8 mg/dL (ref 1.7–2.4)

## 2018-09-18 LAB — CBC WITH DIFFERENTIAL/PLATELET
Abs Immature Granulocytes: 0.28 10*3/uL — ABNORMAL HIGH (ref 0.00–0.07)
BASOS ABS: 0.1 10*3/uL (ref 0.0–0.1)
Basophils Relative: 1 %
EOS ABS: 0.3 10*3/uL (ref 0.0–0.5)
EOS PCT: 3 %
HCT: 29.9 % — ABNORMAL LOW (ref 36.0–46.0)
Hemoglobin: 9.7 g/dL — ABNORMAL LOW (ref 12.0–15.0)
Immature Granulocytes: 3 %
LYMPHS ABS: 2.4 10*3/uL (ref 0.7–4.0)
LYMPHS PCT: 22 %
MCH: 28.9 pg (ref 26.0–34.0)
MCHC: 32.4 g/dL (ref 30.0–36.0)
MCV: 89 fL (ref 80.0–100.0)
MONO ABS: 1.3 10*3/uL — AB (ref 0.1–1.0)
Monocytes Relative: 12 %
NRBC: 0 % (ref 0.0–0.2)
Neutro Abs: 6.4 10*3/uL (ref 1.7–7.7)
Neutrophils Relative %: 59 %
Platelets: 444 10*3/uL — ABNORMAL HIGH (ref 150–400)
RBC: 3.36 MIL/uL — ABNORMAL LOW (ref 3.87–5.11)
RDW: 15.9 % — AB (ref 11.5–15.5)
WBC: 10.7 10*3/uL — AB (ref 4.0–10.5)

## 2018-09-18 MED ORDER — FAMOTIDINE IN NACL 20-0.9 MG/50ML-% IV SOLN
20.0000 mg | Freq: Once | INTRAVENOUS | Status: AC
  Administered 2018-09-18: 20 mg via INTRAVENOUS

## 2018-09-18 MED ORDER — DIPHENHYDRAMINE HCL 50 MG/ML IJ SOLN
25.0000 mg | Freq: Once | INTRAMUSCULAR | Status: AC
  Administered 2018-09-18: 25 mg via INTRAVENOUS

## 2018-09-18 MED ORDER — TRASTUZUMAB CHEMO 150 MG IV SOLR
2.0000 mg/kg | Freq: Once | INTRAVENOUS | Status: AC
  Administered 2018-09-18: 168 mg via INTRAVENOUS
  Filled 2018-09-18: qty 8

## 2018-09-18 MED ORDER — FAMOTIDINE IN NACL 20-0.9 MG/50ML-% IV SOLN
INTRAVENOUS | Status: AC
  Filled 2018-09-18: qty 50

## 2018-09-18 MED ORDER — HYDROCORTISONE 1 % EX OINT: 1.0000 "application " | TOPICAL_OINTMENT | Freq: Two times a day (BID) | CUTANEOUS | 0 refills | Status: DC

## 2018-09-18 MED ORDER — ACETAMINOPHEN 325 MG PO TABS
650.0000 mg | ORAL_TABLET | Freq: Once | ORAL | Status: AC
  Administered 2018-09-18: 650 mg via ORAL

## 2018-09-18 MED ORDER — HEPARIN SOD (PORK) LOCK FLUSH 100 UNIT/ML IV SOLN
500.0000 [IU] | Freq: Once | INTRAVENOUS | Status: AC | PRN
  Administered 2018-09-18: 500 [IU]

## 2018-09-18 MED ORDER — SODIUM CHLORIDE 0.9 % IV SOLN
20.0000 mg | Freq: Once | INTRAVENOUS | Status: AC
  Administered 2018-09-18: 20 mg via INTRAVENOUS
  Filled 2018-09-18: qty 2

## 2018-09-18 MED ORDER — SODIUM CHLORIDE 0.9 % IV SOLN
Freq: Once | INTRAVENOUS | Status: AC
  Administered 2018-09-18: 10:00:00 via INTRAVENOUS

## 2018-09-18 MED ORDER — DIPHENHYDRAMINE HCL 50 MG/ML IJ SOLN
INTRAMUSCULAR | Status: AC
  Filled 2018-09-18: qty 1

## 2018-09-18 MED ORDER — ACETAMINOPHEN 325 MG PO TABS
ORAL_TABLET | ORAL | Status: AC
  Filled 2018-09-18: qty 2

## 2018-09-18 MED ORDER — SODIUM CHLORIDE 0.9% FLUSH
10.0000 mL | INTRAVENOUS | Status: DC | PRN
  Administered 2018-09-18: 10 mL
  Filled 2018-09-18: qty 10

## 2018-09-18 MED ORDER — FAMOTIDINE IN NACL 20-0.9 MG/50ML-% IV SOLN: 20.0000 mg | Freq: Once | INTRAVENOUS | Status: DC

## 2018-09-18 MED ORDER — SODIUM CHLORIDE 0.9 % IV SOLN
53.3333 mg/m2 | Freq: Once | INTRAVENOUS | Status: AC
  Administered 2018-09-18: 102 mg via INTRAVENOUS
  Filled 2018-09-18: qty 17

## 2018-09-18 NOTE — Patient Instructions (Signed)
[facility] at [facility] Discharge Instructions   Follow up in 1 weeks with labs and treatment.   Thank you for choosing [facility] at [facility] to provide your oncology and hematology care.  To afford each patient quality time with our provider, please arrive at least 15 minutes before your scheduled appointment time.   If you have a lab appointment with the Cancer Center please come in thru the  Main Entrance and check in at the main information desk  You need to re-schedule your appointment should you arrive 10 or more minutes late.  We strive to give you quality time with our providers, and arriving late affects you and other patients whose appointments are after yours.  Also, if you no show three or more times for appointments you may be dismissed from the clinic at the providers discretion.     Again, thank you for choosing [facility].  Our hope is that these requests will decrease the amount of time that you wait before being seen by our physicians.       _____________________________________________________________  Should you have questions after your visit to [facility], please contact our office at [phone] between the hours of 8:00 a.m. and 4:30 p.m.  Voicemails left after 4:00 p.m. will not be returned until the following business day.  For prescription refill requests, have your pharmacy contact our office and allow 72 hours.    Cancer Center Support Programs:   > Cancer Support Group  2nd Tuesday of the month 1pm-2pm, Journey Room    

## 2018-09-18 NOTE — Progress Notes (Signed)
[place], [place] [address]   CLINIC:  Medical Oncology/Hematology  PCP:  [name], MD [address] [place] [address] [phone]   REASON FOR VISIT: Follow-up for right breast cancer, ER-/PR-/HER2+   CURRENT THERAPY: Paclitaxel and Herceptin   BRIEF ONCOLOGIC HISTORY:    Malignant neoplasm of upper-outer quadrant of right breast in female, estrogen receptor negative ([place])   07/03/2018 Initial Diagnosis    Palpable right breast mass for several months with skin discoloration, clinically skin trabecular thickening with nipple retraction measuring 7.2 x 4.3 x 4.2 cm, additional mass 4 o'clock position 2.6 cm, right axillary tail 1.2 cm right axillary lymph node 1.8 cm left breast irregular mass 1 cm, adjacent mass 0.9 cm together measuring 1.9 cm, no lymphadenopathy    07/03/2018 Pathology Results    Right breast biopsy: IDC grade 2, ER 0%, PR 0%, HER-2 positive, Ki-67 40%, lymph node biopsy negative, left breast biopsy complex sclerosing lesion     07/15/2018 Cancer Staging    Staging form: Breast, AJCC 8th Edition - Clinical: Stage IIB (cT3, cN0, cM0, G2, ER-, PR-, HER2+) - Signed by [name], MD on 07/15/2018    07/29/2018 -  Chemotherapy    The patient had trastuzumab (HERCEPTIN) 336 mg in sodium chloride 0.9 % 250 mL chemo infusion, 4 mg/kg = 336 mg, Intravenous,  Once, 2 of 3 cycles Administration: 336 mg (07/31/2018), 168 mg (08/07/2018), 168 mg (08/28/2018), 168 mg (08/14/2018), 168 mg (08/21/2018), 168 mg (09/04/2018), 168 mg (09/11/2018) PACLitaxel (TAXOL) 78 mg in sodium chloride 0.9 % 250 mL chemo infusion (</= 16m/m2), 40 mg/m2 = 78 mg (50 % of original dose 80 mg/m2), Intravenous,  Once, 2 of 3 cycles Dose modification: 40 mg/m2 (50 % of original dose 80 mg/m2, Cycle 1, Reason: Patient Age), 53.3333 mg/m2 (66.7 % of original dose 80 mg/m2, Cycle 1, Reason: Provider Judgment) Administration: 78 mg (07/31/2018), 102 mg  (08/07/2018), 102 mg (08/28/2018), 102 mg (08/14/2018), 102 mg (08/21/2018), 102 mg (09/04/2018)  for chemotherapy treatment.       CANCER STAGING: Cancer Staging Malignant neoplasm of upper-outer quadrant of right breast in female, estrogen receptor negative ([facility] Staging form: Breast, AJCC 8th Edition - Clinical: Stage IIB (cT3, cN0, cM0, G2, ER-, PR-, HER2+) - Signed by GNicholas Lose MD on 07/15/2018    INTERVAL HISTORY:  Ms. PBellin89y.o. female returns for routine follow-up for right breast cancer. She is here today with her husband and doing well. She is tolerating treatment well. She had a break from the chemo last week and she has more energy since last visit. She still has a rash on her legs. They are round patchy area that itch. She has ben usuing the cream we prescribed daily. She denies any numbness or tingling in her hands or feet. Denies any nausea,vomiting, or diarrhea. Denies any mouth sores. Denies any bleeding. She reports her appetite and energy level at 100% and she is maintaining her weight well.     REVIEW OF SYSTEMS:  Review of Systems  All other systems reviewed and are negative.    PAST MEDICAL/SURGICAL HISTORY:  Past Medical History:  Diagnosis Date  . Cancer ([facility] 06/2018   right breast cancer  . GERD (gastroesophageal reflux disease)    occasional   . Hyperlipidemia   . Hypertension    clearance with note Dr [place] [facility] chart  . Pneumonia    2 years ago/ states occ  cough with sinus drainage- no fever  . Thyroid nodule    with biopsy- states following medically   Past Surgical History:  Procedure Laterality Date  . CATARACT EXTRACTION W/PHACO  11/30/2012   Procedure: CATARACT EXTRACTION PHACO AND INTRAOCULAR LENS PLACEMENT (IOC);  Surgeon: Williams Che, MD;  Location: [facility];  Service: Ophthalmology;  Laterality: Left;  CDE:11.49  . CATARACT EXTRACTION W/PHACO Right 03/15/2013   Procedure: CATARACT EXTRACTION PHACO AND INTRAOCULAR LENS PLACEMENT  (IOC);  Surgeon: Williams Che, MD;  Location: [facility];  Service: Ophthalmology;  Laterality: Right;  CDE 16.15  . COLONOSCOPY    . EXCISION OF SKIN TAG Right 06/17/2016   Procedure: SHAVE OF SKIN TAG RIGHT BUTTOCK;  Surgeon: Aviva Signs, MD;  Location: [facility];  Service: General;  Laterality: Right;  . JOINT REPLACEMENT     left knee  . MASS EXCISION Left 06/17/2016   Procedure: EXCISION SKIN MALIGNANT LESION LEFT BUTTOCK;  Surgeon: Aviva Signs, MD;  Location: [facility];  Service: General;  Laterality: Left;  . PORTACATH PLACEMENT Right 07/28/2018   Procedure: INSERTION PORT-A-CATH WITH ULTRASOUND;  Surgeon: Rolm Bookbinder, MD;  Location: [facility];  Service: General;  Laterality: Right;  . PUNCH BIOPSY OF SKIN Right 07/28/2018   Procedure: PUNCH BIOPSY OF SKIN RIGHT BREAST;  Surgeon: Rolm Bookbinder, MD;  Location: [facility];  Service: General;  Laterality: Right;  . TOTAL KNEE ARTHROPLASTY  12/16/2011   Procedure: TOTAL KNEE ARTHROPLASTY;  Surgeon: Gearlean Alf, MD;  Location: [facility];  Service: Orthopedics;  Laterality: Right;  . TUBAL LIGATION       SOCIAL HISTORY:  Social History   Socioeconomic History  . Marital status: Married    Spouse name: Not on file  . Number of children: 2  . Years of education: some business school after HS  . Highest education level: Not on file  Occupational History  . Occupation: Cabin crew  . Occupation: Network engineer   Social Needs  . Financial resource strain: Not hard at all  . Food insecurity:    Worry: Never true    Inability: Never true  . Transportation needs:    Medical: No    Non-medical: No  Tobacco Use  . Smoking status: Never Smoker  . Smokeless tobacco: Never Used  Substance and Sexual Activity  . Alcohol use: No    Frequency: Never  . Drug use: No  . Sexual activity: Yes    Birth control/protection: None  Lifestyle  . Physical activity:    Days per week: 0 days    Minutes per session: 0  min  . Stress: Not at all  Relationships  . Social connections:    Talks on phone: More than three times a week    Gets together: More than three times a week    Attends religious service: More than 4 times per year    Active member of club or organization: Yes    Attends meetings of clubs or organizations: Never    Relationship status: Married  . Intimate partner violence:    Fear of current or ex partner: No    Emotionally abused: No    Physically abused: No    Forced sexual activity: No  Other Topics Concern  . Not on file  Social History Narrative   Lives at home with her husband.   4 cups caffeine per day.   Right-handed.    FAMILY HISTORY:  Family History  Problem Relation Age  of Onset  . Heart attack Mother   . Bronchitis Father   . Diabetes Sister   . Leukemia Brother   . Lung disease Brother   . Lung disease Sister     CURRENT MEDICATIONS:  Outpatient Encounter Medications as of 09/18/2018  Medication Sig  . acetaminophen (TYLENOL) 325 MG tablet Take 650 mg by mouth every 6 (six) hours as needed.  Marland Kitchen aspirin 325 MG tablet Take 162.5 mg by mouth daily.   . cetirizine (ZYRTEC) 10 MG tablet Take 10 mg by mouth daily.  Marland Kitchen dexamethasone (DECADRON) 2 MG tablet Take 1 tablet (2 mg total) by mouth 2 (two) times daily.  . diphenhydrAMINE (BENADRYL) 25 MG tablet Take 25 mg by mouth at bedtime as needed.  . hydrocortisone 1 % ointment Apply 1 application topically 2 (two) times daily.  Marland Kitchen losartan (COZAAR) 100 MG tablet Take 100 mg by mouth daily.   . Multiple Vitamin (MULTIVITAMIN WITH MINERALS) TABS tablet Take 1 tablet by mouth daily.  . Omega-3 Fatty Acids (FISH OIL PO) Take 2 capsules by mouth daily.   Marland Kitchen PACLitaxel (TAXOL IV) Inject into the vein.  . pantoprazole (PROTONIX) 40 MG tablet Take 40 mg by mouth daily.  . simvastatin (ZOCOR) 10 MG tablet Take 10 mg by mouth daily.  . Trastuzumab (HERCEPTIN IV) Inject into the vein.  . [DISCONTINUED] hydrocortisone 1 %  ointment Apply 1 application topically 2 (two) times daily.  Marland Kitchen lidocaine-prilocaine (EMLA) cream Apply to port site one hour prior to appointment and cover with plastic wrap. (Patient not taking: Reported on 09/18/2018)  . prochlorperazine (COMPAZINE) 10 MG tablet Take 1 tablet (10 mg total) by mouth every 6 (six) hours as needed (Nausea or vomiting). (Patient not taking: Reported on 09/18/2018)  . traMADol (ULTRAM) 50 MG tablet Take 1 tablet (50 mg total) by mouth every 6 (six) hours as needed. (Patient not taking: Reported on 09/18/2018)   No facility-administered encounter medications on file as of 09/18/2018.     ALLERGIES:  Allergies  Allergen Reactions  . Penicillins Rash    Has patient had a PCN reaction causing immediate rash, facial/tongue/throat swelling, SOB or lightheadedness with hypotension: No Has patient had a PCN reaction causing severe rash involving mucus membranes or skin necrosis: No Has patient had a PCN reaction that required hospitalization: No Has patient had a PCN reaction occurring within the last 10 years: No If all of the above answers are "NO", then may proceed with Cephalosporin use.      PHYSICAL EXAM:  ECOG Performance status: 1 I have reviewed her vitals.  Blood pressure 136/70.  Pulse rate is 110.  Respirate is 20.  Temperature 98.7. Physical Exam Right breast erythema has decreased.  Still some erythema remaining in the upper outer quadrant of the right breast.  Mass in the center has also decreased in size.  LABORATORY DATA:  I have reviewed the labs as listed.  CBC    Component Value Date/Time   WBC 10.7 (H) 09/18/2018 0838   RBC 3.36 (L) 09/18/2018 0838   HGB 9.7 (L) 09/18/2018 0838   HGB 12.7 11/24/2017 1626   HCT 29.9 (L) 09/18/2018 0838   HCT 36.6 11/24/2017 1626   PLT 444 (H) 09/18/2018 0838   PLT 420 (H) 11/24/2017 1626   MCV 89.0 09/18/2018 0838   MCV 88 11/24/2017 1626   MCH 28.9 09/18/2018 0838   MCHC 32.4 09/18/2018 0838    RDW 15.9 (H) 09/18/2018 0838   RDW 15.4  11/24/2017 1626   LYMPHSABS 2.4 09/18/2018 0838   LYMPHSABS 2.5 11/24/2017 1626   MONOABS 1.3 (H) 09/18/2018 0838   EOSABS 0.3 09/18/2018 0838   EOSABS 0.1 11/24/2017 1626   BASOSABS 0.1 09/18/2018 0838   BASOSABS 0.0 11/24/2017 1626   CMP Latest Ref Rng & Units 09/18/2018 09/11/2018 09/04/2018  Glucose 70 - 99 mg/dL 114(H) 125(H) 124(H)  BUN 8 - 23 mg/dL 14 20 15   Creatinine 0.44 - 1.00 mg/dL 1.22(H) 1.23(H) 1.12(H)  Sodium 135 - 145 mmol/L 128(L) 130(L) 127(L)  Potassium 3.5 - 5.1 mmol/L 4.3 4.8 4.3  Chloride 98 - 111 mmol/L 97(L) 98 94(L)  CO2 22 - 32 mmol/L 22 23 23   Calcium 8.9 - 10.3 mg/dL 9.0 8.9 9.1  Total Protein 6.5 - 8.1 g/dL 6.7 6.6 7.0  Total Bilirubin 0.3 - 1.2 mg/dL 0.5 0.8 0.6  Alkaline Phos 38 - 126 U/L 81 72 78  AST 15 - 41 U/L 26 26 28   ALT 0 - 44 U/L 27 30 32         ASSESSMENT & PLAN:   Malignant neoplasm of upper-outer quadrant of right breast in female, estrogen receptor negative ([facility]) 1.  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 - Right breast biopsy at 11:30 position on 07/03/2018 shows invasive ductal carcinoma, ER/PR negative, HER-2 positive, Ki-67 of 40%, right axillary lymph node biopsy shows benign tissue, left breast core needle biopsy at 230 o'clock position shows complex sclerosing lesion - CT chest abdomen and pelvis on 07/16/2018 showed a 6 mm nodule in the posterior left lower lobe with no other evidence of metastatic disease. -Bone scan on 07/16/2018 shows focal area of increased  activity over the distal left clavicle/shoulder consistent with degenerative changes. - Skin punch biopsy of the right breast on 07/28/2018 was positive for dermal lymphatic invasion. -Echo on 07/27/2018 shows ejection fraction of 55 to 60%. -She was started on weekly paclitaxel and Herceptin on 07/31/2018. -She completed 6 weeks on 09/04/2018.  Because of severe tiredness, I held her paclitaxel last week. - Her tiredness has improved.  She is able to do all her activities.  Today's physical examination shows decrease in erythema of her right breast.  Decrease in size of the palpable mass.  She may proceed with her next weekly dose of paclitaxel and Herceptin. - She will come back during the first week of December for her next treatment.  2.  Taxane induced skin rash: - She has developed erythematous maculopapular rash on the extremities, predominantly on the shins after week 5 of treatment. - Today the rash looks much better.  It is less red and less palpable.  She is applying hydrocortisone cream twice a day.  She will also use dexamethasone 2 mg twice a day for 2 days after today's treatment.      Orders placed this encounter:  No orders of the defined types were placed in this encounter.     SDerek Jack MD [id number] [phone]

## 2018-09-18 NOTE — Assessment & Plan Note (Addendum)
1.  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 - Right breast biopsy at 11:30 position on 07/03/2018 shows invasive ductal carcinoma, ER/PR negative, HER-2 positive, Ki-67 of 40%, right axillary lymph node biopsy shows benign tissue, left breast core needle biopsy at 230 o'clock position shows complex sclerosing lesion - CT chest abdomen and pelvis on 07/16/2018 showed a 6 mm nodule in the posterior left lower lobe with no other evidence of metastatic disease. -Bone scan on 07/16/2018 shows focal area of increased activity over the distal left clavicle/shoulder consistent with degenerative changes. - Skin punch biopsy of the right breast on 07/28/2018 was positive for dermal lymphatic invasion. -Echo on 07/27/2018 shows ejection fraction of 55 to 60%. -She was started on weekly paclitaxel and Herceptin on 07/31/2018. -She completed 6 weeks on 09/04/2018.  Because of severe tiredness, I held her paclitaxel last week. - Her tiredness has improved.  She is able to do all her activities.  Today's physical examination shows decrease in erythema of her right breast.  Decrease in size of the palpable mass.  She may proceed with her next weekly dose of paclitaxel and Herceptin. - She will come back during the first week of December for her next treatment.  2.  Taxane induced skin rash: - She has developed erythematous maculopapular rash on the extremities, predominantly on the shins after week 5 of treatment. - Today the rash looks much better.  It is less red and less palpable.  She is  applying hydrocortisone cream twice a day.  She will also use dexamethasone 2 mg twice a day for 2 days after today's treatment.

## 2018-09-18 NOTE — Progress Notes (Signed)
Labs reviewed with Md today at doctor visit. Proceed with treatment.  Treatment given per orders. Patient tolerated it well without problems. Vitals stable and discharged home from clinic ambulatory. Follow up as scheduled.

## 2018-09-18 NOTE — Patient Instructions (Signed)
[facility] Discharge Instructions for Patients Receiving Chemotherapy   Beginning January 23rd 2017 lab work for the Cancer Center will be done in the  Main lab at [facility] on 1st floor. If you have a lab appointment with the Cancer Center please come in thru the  Main Entrance and check in at the main information desk   Today you received the following chemotherapy agents   To help prevent nausea and vomiting after your treatment, we encourage you to take your nausea medication     If you develop nausea and vomiting, or diarrhea that is not controlled by your medication, call the clinic.  The clinic phone number is [phone]. Office hours are Monday-Friday 8:30am-5:00pm.  BELOW ARE SYMPTOMS THAT SHOULD BE REPORTED IMMEDIATELY:  *FEVER GREATER THAN 101.0 F  *CHILLS WITH OR WITHOUT FEVER  NAUSEA AND VOMITING THAT IS NOT CONTROLLED WITH YOUR NAUSEA MEDICATION  *UNUSUAL SHORTNESS OF BREATH  *UNUSUAL BRUISING OR BLEEDING  TENDERNESS IN MOUTH AND THROAT WITH OR WITHOUT PRESENCE OF ULCERS  *URINARY PROBLEMS  *BOWEL PROBLEMS  UNUSUAL RASH Items with * indicate a potential emergency and should be followed up as soon as possible. If you have an emergency after office hours please contact your primary care physician or go to the nearest emergency department.  Please call the clinic during office hours if you have any questions or concerns.   You may also contact the Patient Navigator at [phone] should you have any questions or need assistance in obtaining follow up care.      Resources For Cancer Patients and their Caregivers ? American Cancer Society: Can assist with transportation, wigs, general needs, runs Look Good Feel Better.        1-888-227-6333 ? Cancer Care: Provides financial assistance, online support groups, medication/co-pay assistance.  1-800-813-HOPE (4673) ? [facility] Assists [place] cancer  patients and their families through emotional , educational and financial support.  336-427-4357 ? [place] DSS Where to apply for food stamps, Medicaid and utility assistance. 336-342-1394 ? [facility]: Transportation to medical appointments. 336-347-2287 ? Social Security Administration: May apply for disability if have a Stage IV cancer. 336-342-7796 1-800-772-1213 ? [place] Aging, Disability and Transit Services: Assists with nutrition, care and transit needs. 336-349-2343         

## 2018-10-02 ENCOUNTER — Encounter (HOSPITAL_COMMUNITY): Payer: Self-pay | Admitting: Hematology

## 2018-10-02 ENCOUNTER — Other Ambulatory Visit: Payer: Self-pay

## 2018-10-02 ENCOUNTER — Inpatient Hospital Stay (HOSPITAL_COMMUNITY): Payer: Medicare HMO

## 2018-10-02 ENCOUNTER — Inpatient Hospital Stay (HOSPITAL_COMMUNITY): Payer: Medicare HMO | Attending: Hematology

## 2018-10-02 ENCOUNTER — Encounter (HOSPITAL_COMMUNITY): Payer: Self-pay

## 2018-10-02 ENCOUNTER — Inpatient Hospital Stay (HOSPITAL_COMMUNITY): Payer: Medicare HMO | Admitting: Hematology

## 2018-10-02 VITALS — BP 144/76 | HR 72 | Temp 97.8°F | Resp 16

## 2018-10-02 DIAGNOSIS — R21 Rash and other nonspecific skin eruption: Secondary | ICD-10-CM | POA: Diagnosis not present

## 2018-10-02 DIAGNOSIS — R197 Diarrhea, unspecified: Secondary | ICD-10-CM | POA: Diagnosis not present

## 2018-10-02 DIAGNOSIS — Z7982 Long term (current) use of aspirin: Secondary | ICD-10-CM | POA: Insufficient documentation

## 2018-10-02 DIAGNOSIS — C773 Secondary and unspecified malignant neoplasm of axilla and upper limb lymph nodes: Secondary | ICD-10-CM

## 2018-10-02 DIAGNOSIS — I10 Essential (primary) hypertension: Secondary | ICD-10-CM

## 2018-10-02 DIAGNOSIS — K219 Gastro-esophageal reflux disease without esophagitis: Secondary | ICD-10-CM | POA: Insufficient documentation

## 2018-10-02 DIAGNOSIS — Z171 Estrogen receptor negative status [ER-]: Secondary | ICD-10-CM | POA: Diagnosis not present

## 2018-10-02 DIAGNOSIS — Z806 Family history of leukemia: Secondary | ICD-10-CM | POA: Diagnosis not present

## 2018-10-02 DIAGNOSIS — E041 Nontoxic single thyroid nodule: Secondary | ICD-10-CM | POA: Diagnosis not present

## 2018-10-02 DIAGNOSIS — Z5112 Encounter for antineoplastic immunotherapy: Secondary | ICD-10-CM

## 2018-10-02 DIAGNOSIS — M7989 Other specified soft tissue disorders: Secondary | ICD-10-CM | POA: Diagnosis not present

## 2018-10-02 DIAGNOSIS — C50411 Malignant neoplasm of upper-outer quadrant of right female breast: Secondary | ICD-10-CM

## 2018-10-02 DIAGNOSIS — E785 Hyperlipidemia, unspecified: Secondary | ICD-10-CM | POA: Diagnosis not present

## 2018-10-02 DIAGNOSIS — Z5111 Encounter for antineoplastic chemotherapy: Secondary | ICD-10-CM | POA: Diagnosis not present

## 2018-10-02 DIAGNOSIS — Z79899 Other long term (current) drug therapy: Secondary | ICD-10-CM

## 2018-10-02 DIAGNOSIS — R5383 Other fatigue: Secondary | ICD-10-CM | POA: Diagnosis not present

## 2018-10-02 LAB — COMPREHENSIVE METABOLIC PANEL
ALT: 27 U/L (ref 0–44)
ANION GAP: 9 (ref 5–15)
AST: 25 U/L (ref 15–41)
Albumin: 3.4 g/dL — ABNORMAL LOW (ref 3.5–5.0)
Alkaline Phosphatase: 86 U/L (ref 38–126)
BUN: 15 mg/dL (ref 8–23)
CALCIUM: 9.2 mg/dL (ref 8.9–10.3)
CO2: 23 mmol/L (ref 22–32)
Chloride: 101 mmol/L (ref 98–111)
Creatinine, Ser: 1.09 mg/dL — ABNORMAL HIGH (ref 0.44–1.00)
GFR calc Af Amer: 53 mL/min — ABNORMAL LOW (ref >60)
GFR calc non Af Amer: 46 mL/min — ABNORMAL LOW (ref >60)
Glucose, Bld: 116 mg/dL — ABNORMAL HIGH (ref 70–99)
Potassium: 4.4 mmol/L (ref 3.5–5.1)
Sodium: 133 mmol/L — ABNORMAL LOW (ref 135–145)
Total Bilirubin: 0.3 mg/dL (ref 0.3–1.2)
Total Protein: 6.8 g/dL (ref 6.5–8.1)

## 2018-10-02 LAB — CBC WITH DIFFERENTIAL/PLATELET
Abs Immature Granulocytes: 0.09 10*3/uL — ABNORMAL HIGH (ref 0.00–0.07)
Basophils Absolute: 0.1 10*3/uL (ref 0.0–0.1)
Basophils Relative: 1 %
EOS PCT: 4 %
Eosinophils Absolute: 0.4 10*3/uL (ref 0.0–0.5)
HCT: 30.2 % — ABNORMAL LOW (ref 36.0–46.0)
Hemoglobin: 9.6 g/dL — ABNORMAL LOW (ref 12.0–15.0)
Immature Granulocytes: 1 %
LYMPHS PCT: 31 %
Lymphs Abs: 2.6 10*3/uL (ref 0.7–4.0)
MCH: 28.9 pg (ref 26.0–34.0)
MCHC: 31.8 g/dL (ref 30.0–36.0)
MCV: 91 fL (ref 80.0–100.0)
Monocytes Absolute: 0.9 10*3/uL (ref 0.1–1.0)
Monocytes Relative: 10 %
Neutro Abs: 4.4 10*3/uL (ref 1.7–7.7)
Neutrophils Relative %: 53 %
Platelets: 402 10*3/uL — ABNORMAL HIGH (ref 150–400)
RBC: 3.32 MIL/uL — AB (ref 3.87–5.11)
RDW: 16.6 % — ABNORMAL HIGH (ref 11.5–15.5)
WBC: 8.5 10*3/uL (ref 4.0–10.5)
nRBC: 0 % (ref 0.0–0.2)

## 2018-10-02 MED ORDER — SODIUM CHLORIDE 0.9 % IV SOLN
20.0000 mg | Freq: Once | INTRAVENOUS | Status: AC
  Administered 2018-10-02: 20 mg via INTRAVENOUS
  Filled 2018-10-02: qty 2

## 2018-10-02 MED ORDER — HEPARIN SOD (PORK) LOCK FLUSH 100 UNIT/ML IV SOLN
500.0000 [IU] | Freq: Once | INTRAVENOUS | Status: AC | PRN
  Administered 2018-10-02: 500 [IU]

## 2018-10-02 MED ORDER — FAMOTIDINE IN NACL 20-0.9 MG/50ML-% IV SOLN
INTRAVENOUS | Status: AC
  Filled 2018-10-02: qty 50

## 2018-10-02 MED ORDER — SODIUM CHLORIDE 0.9% FLUSH
10.0000 mL | INTRAVENOUS | Status: DC | PRN
  Administered 2018-10-02: 10 mL
  Filled 2018-10-02: qty 10

## 2018-10-02 MED ORDER — SODIUM CHLORIDE 0.9 % IV SOLN
45.0000 mg/m2 | Freq: Once | INTRAVENOUS | Status: AC
  Administered 2018-10-02: 84 mg via INTRAVENOUS
  Filled 2018-10-02: qty 14

## 2018-10-02 MED ORDER — DIPHENHYDRAMINE HCL 50 MG/ML IJ SOLN
INTRAMUSCULAR | Status: AC
  Filled 2018-10-02: qty 1

## 2018-10-02 MED ORDER — TRASTUZUMAB CHEMO 150 MG IV SOLR
2.0000 mg/kg | Freq: Once | INTRAVENOUS | Status: AC
  Administered 2018-10-02: 168 mg via INTRAVENOUS
  Filled 2018-10-02: qty 8

## 2018-10-02 MED ORDER — SODIUM CHLORIDE 0.9 % IV SOLN
Freq: Once | INTRAVENOUS | Status: AC
  Administered 2018-10-02: 09:00:00 via INTRAVENOUS

## 2018-10-02 MED ORDER — FAMOTIDINE IN NACL 20-0.9 MG/50ML-% IV SOLN
20.0000 mg | Freq: Once | INTRAVENOUS | Status: AC
  Administered 2018-10-02: 20 mg via INTRAVENOUS

## 2018-10-02 MED ORDER — ACETAMINOPHEN 325 MG PO TABS
ORAL_TABLET | ORAL | Status: AC
  Filled 2018-10-02: qty 2

## 2018-10-02 MED ORDER — DIPHENHYDRAMINE HCL 50 MG/ML IJ SOLN
25.0000 mg | Freq: Once | INTRAMUSCULAR | Status: AC
  Administered 2018-10-02: 25 mg via INTRAVENOUS

## 2018-10-02 MED ORDER — ACETAMINOPHEN 325 MG PO TABS
650.0000 mg | ORAL_TABLET | Freq: Once | ORAL | Status: AC
  Administered 2018-10-02: 650 mg via ORAL

## 2018-10-02 NOTE — Patient Instructions (Signed)
[facility] at [facility] Discharge Instructions     Thank you for choosing [facility] at [facility] to provide your oncology and hematology care.  To afford each patient quality time with our provider, please arrive at least 15 minutes before your scheduled appointment time.   If you have a lab appointment with the Cancer Center please come in thru the  Main Entrance and check in at the main information desk  You need to re-schedule your appointment should you arrive 10 or more minutes late.  We strive to give you quality time with our providers, and arriving late affects you and other patients whose appointments are after yours.  Also, if you no show three or more times for appointments you may be dismissed from the clinic at the providers discretion.     Again, thank you for choosing [facility].  Our hope is that these requests will decrease the amount of time that you wait before being seen by our physicians.       _____________________________________________________________  Should you have questions after your visit to [facility], please contact our office at [phone] between the hours of 8:00 a.m. and 4:30 p.m.  Voicemails left after 4:00 p.m. will not be returned until the following business day.  For prescription refill requests, have your pharmacy contact our office and allow 72 hours.    Cancer Center Support Programs:   > Cancer Support Group  2nd Tuesday of the month 1pm-2pm, Journey Room    

## 2018-10-02 NOTE — Progress Notes (Signed)
[place], [place] [address]   CLINIC:  Medical Oncology/Hematology  PCP:  [name], MD [address] [place] [address] [phone]   REASON FOR VISIT: Follow-up for right breast cancer, ER-/PR-/HER2+  CURRENT THERAPY: Paclitaxel and Herceptin   BRIEF ONCOLOGIC HISTORY:    Malignant neoplasm of upper-outer quadrant of right breast in female, estrogen receptor negative ([place])   07/03/2018 Initial Diagnosis    Palpable right breast mass for several months with skin discoloration, clinically skin trabecular thickening with nipple retraction measuring 7.2 x 4.3 x 4.2 cm, additional mass 4 o'clock position 2.6 cm, right axillary tail 1.2 cm right axillary lymph node 1.8 cm left breast irregular mass 1 cm, adjacent mass 0.9 cm together measuring 1.9 cm, no lymphadenopathy    07/03/2018 Pathology Results    Right breast biopsy: IDC grade 2, ER 0%, PR 0%, HER-2 positive, Ki-67 40%, lymph node biopsy negative, left breast biopsy complex sclerosing lesion     07/15/2018 Cancer Staging    Staging form: Breast, AJCC 8th Edition - Clinical: Stage IIB (cT3, cN0, cM0, G2, ER-, PR-, HER2+) - Signed by [name], MD on 07/15/2018    07/29/2018 -  Chemotherapy    The patient had trastuzumab (HERCEPTIN) 336 mg in sodium chloride 0.9 % 250 mL chemo infusion, 4 mg/kg = 336 mg, Intravenous,  Once, 3 of 3 cycles Administration: 336 mg (07/31/2018), 168 mg (08/07/2018), 168 mg (08/28/2018), 168 mg (08/14/2018), 168 mg (08/21/2018), 168 mg (09/04/2018), 168 mg (09/11/2018), 168 mg (09/18/2018) PACLitaxel (TAXOL) 78 mg in sodium chloride 0.9 % 250 mL chemo infusion (</= 19m/m2), 40 mg/m2 = 78 mg (50 % of original dose 80 mg/m2), Intravenous,  Once, 3 of 3 cycles Dose modification: 40 mg/m2 (50 % of original dose 80 mg/m2, Cycle 1, Reason: Patient Age), 53.3333 mg/m2 (66.7 % of original dose 80 mg/m2, Cycle 1, Reason: Provider Judgment), 45 mg/m2 (original dose  80 mg/m2, Cycle 3, Reason: Provider Judgment) Administration: 78 mg (07/31/2018), 102 mg (08/07/2018), 102 mg (08/28/2018), 102 mg (08/14/2018), 102 mg (08/21/2018), 102 mg (09/04/2018), 102 mg (09/18/2018)  for chemotherapy treatment.       CANCER STAGING: Cancer Staging Malignant neoplasm of upper-outer quadrant of right breast in female, estrogen receptor negative ([facility] Staging form: Breast, AJCC 8th Edition - Clinical: Stage IIB (cT3, cN0, cM0, G2, ER-, PR-, HER2+) - Signed by GNicholas Lose MD on 07/15/2018    INTERVAL HISTORY:  Ms. [id number]. female returns for routine follow-up for right breast cancer. She is here today with her husband. She still has a rash on her lower legs. They are not as red but they still itch. She felt very fatigued after her last treatment and felt her blood pressure was low. She has had a few episodes of diarrhea which stopped with medications. She denies any bleeding or easy bruising. Denies any nausea or vomiting. Denies any SOB. Denies any fevers or recent infections. She reports her appetite at 75% and her energy level at 50%.    REVIEW OF SYSTEMS:  Review of Systems  Constitutional: Positive for fatigue.  Gastrointestinal: Positive for diarrhea.  Skin: Positive for itching and rash.  All other systems reviewed and are negative.    PAST MEDICAL/SURGICAL HISTORY:  Past Medical History:  Diagnosis Date  . [place] 06/[place]   right breast cancer  . GERD (gastroesophageal reflux disease)    occasional   . Hyperlipidemia   .  Hypertension    clearance with note Dr Hall on chart  . Pneumonia    2 years ago/ states occ cough with sinus drainage- no fever  . Thyroid nodule    with biopsy- states following medically   Past Surgical History:  Procedure Laterality Date  . CATARACT EXTRACTION W/PHACO  11/30/2012   Procedure: CATARACT EXTRACTION PHACO AND INTRAOCULAR LENS PLACEMENT (IOC);  Surgeon: Carroll F Haines, MD;  Location: [facility];  Service:  Ophthalmology;  Laterality: Left;  CDE:11.49  . CATARACT EXTRACTION W/PHACO Right 03/15/2013   Procedure: CATARACT EXTRACTION PHACO AND INTRAOCULAR LENS PLACEMENT (IOC);  Surgeon: Carroll F Haines, MD;  Location: [facility];  Service: Ophthalmology;  Laterality: Right;  CDE 16.15  . COLONOSCOPY    . EXCISION OF SKIN TAG Right 06/17/2016   Procedure: SHAVE OF SKIN TAG RIGHT BUTTOCK;  Surgeon: Mark Jenkins, MD;  Location: [facility];  Service: General;  Laterality: Right;  . JOINT REPLACEMENT     left knee  . MASS EXCISION Left 06/17/2016   Procedure: EXCISION SKIN MALIGNANT LESION LEFT BUTTOCK;  Surgeon: Mark Jenkins, MD;  Location: [facility];  Service: General;  Laterality: Left;  . PORTACATH PLACEMENT Right 07/28/2018   Procedure: INSERTION PORT-A-CATH WITH ULTRASOUND;  Surgeon: Wakefield, Matthew, MD;  Location: [facility];  Service: General;  Laterality: Right;  . PUNCH BIOPSY OF SKIN Right 07/28/2018   Procedure: PUNCH BIOPSY OF SKIN RIGHT BREAST;  Surgeon: Wakefield, Matthew, MD;  Location: [facility];  Service: General;  Laterality: Right;  . TOTAL KNEE ARTHROPLASTY  12/16/2011   Procedure: TOTAL KNEE ARTHROPLASTY;  Surgeon: Frank V Aluisio, MD;  Location: [facility];  Service: Orthopedics;  Laterality: Right;  . TUBAL LIGATION       SOCIAL HISTORY:  Social History   Socioeconomic History  . Marital status: Married    Spouse name: Not on file  . Number of children: 2  . Years of education: some business school after HS  . Highest education level: Not on file  Occupational History  . Occupation: Realtor  . Occupation: Secretary   Social Needs  . Financial resource strain: Not hard at all  . Food insecurity:    Worry: Never true    Inability: Never true  . Transportation needs:    Medical: No    Non-medical: No  Tobacco Use  . Smoking status: Never Smoker  . Smokeless tobacco: Never Used  Substance and Sexual Activity  . Alcohol use: No    Frequency: Never    . Drug use: No  . Sexual activity: Yes    Birth control/protection: None  Lifestyle  . Physical activity:    Days per week: 0 days    Minutes per session: 0 min  . Stress: Not at all  Relationships  . Social connections:    Talks on phone: More than three times a week    Gets together: More than three times a week    Attends religious service: More than 4 times per year    Active member of club or organization: Yes    Attends meetings of clubs or organizations: Never    Relationship status: Married  . Intimate partner violence:    Fear of current or ex partner: No    Emotionally abused: No    Physically abused: No    Forced sexual activity: No  Other Topics Concern  . Not on file  Social History Narrative   Lives at home with her   husband.   4 cups caffeine per day.   Right-handed.    FAMILY HISTORY:  Family History  Problem Relation Age of Onset  . Heart attack Mother   . Bronchitis Father   . Diabetes Sister   . Leukemia Brother   . Lung disease Brother   . Lung disease Sister     CURRENT MEDICATIONS:  Outpatient Encounter Medications as of 10/02/2018  Medication Sig  . acetaminophen (TYLENOL) 325 MG tablet Take 650 mg by mouth every 6 (six) hours as needed.  Marland Kitchen aspirin 325 MG tablet Take 162.5 mg by mouth daily.   . cetirizine (ZYRTEC) 10 MG tablet Take 10 mg by mouth daily.  Marland Kitchen dexamethasone (DECADRON) 2 MG tablet Take 1 tablet (2 mg total) by mouth 2 (two) times daily.  . diphenhydrAMINE (BENADRYL) 25 MG tablet Take 25 mg by mouth at bedtime as needed.  . hydrocortisone 1 % ointment Apply 1 application topically 2 (two) times daily.  Marland Kitchen lidocaine-prilocaine (EMLA) cream Apply to port site one hour prior to appointment and cover with plastic wrap.  . losartan (COZAAR) 100 MG tablet Take 100 mg by mouth daily.   . Multiple Vitamin (MULTIVITAMIN WITH MINERALS) TABS tablet Take 1 tablet by mouth daily.  . Omega-3 Fatty Acids (FISH OIL PO) Take 2 capsules by mouth  daily.   Marland Kitchen PACLitaxel (TAXOL IV) Inject into the vein.  . pantoprazole (PROTONIX) 40 MG tablet Take 40 mg by mouth daily.  . prochlorperazine (COMPAZINE) 10 MG tablet Take 1 tablet (10 mg total) by mouth every 6 (six) hours as needed (Nausea or vomiting).  . simvastatin (ZOCOR) 10 MG tablet Take 10 mg by mouth daily.  . traMADol (ULTRAM) 50 MG tablet Take 1 tablet (50 mg total) by mouth every 6 (six) hours as needed.  . Trastuzumab (HERCEPTIN IV) Inject into the vein.   No facility-administered encounter medications on file as of 10/02/2018.     ALLERGIES:  Allergies  Allergen Reactions  . Penicillins Rash    Has patient had a PCN reaction causing immediate rash, facial/tongue/throat swelling, SOB or lightheadedness with hypotension: No Has patient had a PCN reaction causing severe rash involving mucus membranes or skin necrosis: No Has patient had a PCN reaction that required hospitalization: No Has patient had a PCN reaction occurring within the last 10 years: No If all of the above answers are "NO", then may proceed with Cephalosporin use.      PHYSICAL EXAM:  ECOG Performance status: 1  Vitals:   10/02/18 0855  BP: (!) 144/73  Pulse: (!) 112  Resp: 18  Temp: 97.9 F (36.6 C)  SpO2: 99%   Filed Weights   10/02/18 0855  Weight: 180 lb 3.2 oz (81.7 kg)    Physical Exam  Constitutional: She is oriented to person, place, and time. She appears well-developed and well-nourished.  Cardiovascular: Normal rate, regular rhythm and normal heart sounds.  Pulmonary/Chest: Effort normal and breath sounds normal.  Musculoskeletal: Normal range of motion.  Neurological: She is alert and oriented to person, place, and time.  Skin: Skin is warm and dry.  Psychiatric: She has a normal mood and affect. Her behavior is normal. Judgment and thought content normal.  Right breast erythema in the upper outer quadrant has decreased.  Retroareolar mass is also decreased.   LABORATORY DATA:   I have reviewed the labs as listed.  CBC    Component Value Date/Time   WBC 8.5 10/02/2018 0829   RBC 3.32 (  L) 10/02/2018 0829   HGB 9.6 (L) 10/02/2018 0829   HGB 12.7 11/24/2017 1626   HCT 30.2 (L) 10/02/2018 0829   HCT 36.6 11/24/2017 1626   PLT 402 (H) 10/02/2018 0829   PLT 420 (H) 11/24/2017 1626   MCV 91.0 10/02/2018 0829   MCV 88 11/24/2017 1626   MCH 28.9 10/02/2018 0829   MCHC 31.8 10/02/2018 0829   RDW 16.6 (H) 10/02/2018 0829   RDW 15.4 11/24/2017 1626   LYMPHSABS 2.6 10/02/2018 0829   LYMPHSABS 2.5 11/24/2017 1626   MONOABS 0.9 10/02/2018 0829   EOSABS 0.4 10/02/2018 0829   EOSABS 0.1 11/24/2017 1626   BASOSABS 0.1 10/02/2018 0829   BASOSABS 0.0 11/24/2017 1626   CMP Latest Ref Rng & Units 10/02/2018 09/18/2018 09/11/2018  Glucose 70 - 99 mg/dL 116(H) 114(H) 125(H)  BUN 8 - 23 mg/dL 15 14 20  Creatinine 0.44 - 1.00 mg/dL 1.09(H) 1.22(H) 1.23(H)  Sodium 135 - 145 mmol/L 133(L) 128(L) 130(L)  Potassium 3.5 - 5.1 mmol/L 4.4 4.3 4.8  Chloride 98 - 111 mmol/L 101 97(L) 98  CO2 22 - 32 mmol/L 23 22 23  Calcium 8.9 - 10.3 mg/dL 9.2 9.0 8.9  Total Protein 6.5 - 8.1 g/dL 6.8 6.7 6.6  Total Bilirubin 0.3 - 1.2 mg/dL 0.3 0.5 0.8  Alkaline Phos 38 - 126 U/L 86 81 72  AST 15 - 41 U/L 25 26 26  ALT 0 - 44 U/L 27 27 30      I have reviewed  , NP's note and agree with the documentation.  I personally performed a face-to-face visit, made revisions and my assessment and plan is as follows.      ASSESSMENT & PLAN:   Malignant neoplasm of upper-outer quadrant of right breast in female, estrogen receptor negative (HCC) 1.  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 - Right breast biopsy at 11:30 position on 07/03/2018 shows invasive ductal carcinoma, ER/PR negative, HER-2 positive, Ki-67 of 40%, right axillary lymph node biopsy shows benign tissue, left breast core needle biopsy at 230 o'clock position shows complex sclerosing lesion - CT chest abdomen and pelvis on 07/16/2018 showed a 6 mm nodule in the posterior left lower lobe with no other evidence of metastatic disease. -Bone scan on 07/16/2018 shows focal area of increased activity over the distal left clavicle/shoulder consistent with degenerative changes. - Skin punch biopsy of the right breast on 07/28/2018 was positive for dermal lymphatic invasion. -Echo on 07/27/2018 shows ejection fraction of 55 to 60%. -She was started on weekly paclitaxel and Herceptin on 07/31/2018. - She completed week 8 of Herceptin and week 7 of Taxol on 09/18/2018. -She felt very tired after the last treatment.  Reportedly blood pressure came down to 110 few weeks ago.  I have told her to not to take losartan if the blood pressure systolic drops to less than 120.  Physical examination today shows decreased erythema in the upper outer quadrant of the right breast.  Vague mass behind the areola is also decreased in size. - Because of her fatigue, I will cut   back on the Taxol dose to 45 mg/m.  She may proceed with Herceptin and paclitaxel today. - She will come back in 1 week for follow-up.  2.  Taxane induced skin rash: - She has developed erythematous maculopapular rash on the extremities, predominantly on the shins after week 5 of treatment. - She is applying hydrocortisone cream twice daily.  Today rash is much paler.  She will continue dexamethasone 2 mg twice daily for 2 days after each Taxol treatment.      Orders placed this encounter:  Orders Placed This Encounter  Procedures  .  CBC with Differential/Platelet  . Comprehensive metabolic panel      Sreedhar Katragadda, MD [facility] [phone]  

## 2018-10-02 NOTE — Progress Notes (Signed)
Patient tolerated treatment with no complaints voiced.  Port site clean and dry with no bruising or swelling noted at site.  Band aid applied.  VSS with discharge and left ambulatory with no s/s of distress noted.   

## 2018-10-02 NOTE — Assessment & Plan Note (Signed)
1.  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 - Right breast biopsy at 11:30 position on 07/03/2018 shows invasive ductal carcinoma, ER/PR negative, HER-2 positive, Ki-67 of 40%, right axillary lymph node biopsy shows benign tissue, left breast core needle biopsy at 230 o'clock position shows complex sclerosing lesion - CT chest abdomen and pelvis on 07/16/2018 showed a 6 mm nodule in the posterior left lower lobe with no other evidence of metastatic disease. -Bone scan on 07/16/2018 shows focal area of increased activity over the distal left clavicle/shoulder consistent with degenerative changes. - Skin punch biopsy of the right breast on 07/28/2018 was positive for dermal lymphatic invasion. -Echo on 07/27/2018 shows ejection fraction of 55 to 60%. -She was started on weekly paclitaxel and Herceptin on 07/31/2018. - She completed week 8 of Herceptin and week 7 of Taxol on 09/18/2018. -She felt very tired after the last treatment.  Reportedly blood pressure came down to 110 few weeks ago.  I have told her to not to take losartan if the blood pressure systolic drops to less than 120.  Physical examination today shows decreased erythema in the upper outer quadrant of the right breast.  Vague mass behind the areola is also decreased in size. - Because of her fatigue, I will cut back on the Taxol dose to 45 mg/m.  She may proceed with Herceptin and paclitaxel today. - She will come back in 1 week for follow-up.  2.  Taxane induced skin rash: - She has developed erythematous  maculopapular rash on the extremities, predominantly on the shins after week 5 of treatment. - She is applying hydrocortisone cream twice daily.  Today rash is much paler.  She will continue dexamethasone 2 mg twice daily for 2 days after each Taxol treatment.

## 2018-10-02 NOTE — Patient Instructions (Signed)
[facility] Discharge Instructions for Patients Receiving Chemotherapy  Today you received the following chemotherapy agents herceptin and taxol.    If you develop nausea and vomiting that is not controlled by your nausea medication, call the clinic.   BELOW ARE SYMPTOMS THAT SHOULD BE REPORTED IMMEDIATELY:  *FEVER GREATER THAN 100.5 F  *CHILLS WITH OR WITHOUT FEVER  NAUSEA AND VOMITING THAT IS NOT CONTROLLED WITH YOUR NAUSEA MEDICATION  *UNUSUAL SHORTNESS OF BREATH  *UNUSUAL BRUISING OR BLEEDING  TENDERNESS IN MOUTH AND THROAT WITH OR WITHOUT PRESENCE OF ULCERS  *URINARY PROBLEMS  *BOWEL PROBLEMS  UNUSUAL RASH Items with * indicate a potential emergency and should be followed up as soon as possible.  Feel free to call the clinic should you have any questions or concerns. The clinic phone number is [phone].  Please show the [facility] [facility] at check-in to the Emergency Department and triage nurse.

## 2018-10-09 ENCOUNTER — Encounter (HOSPITAL_COMMUNITY): Payer: Self-pay

## 2018-10-09 ENCOUNTER — Inpatient Hospital Stay (HOSPITAL_COMMUNITY): Payer: Medicare HMO

## 2018-10-09 VITALS — BP 141/65 | HR 73 | Temp 98.0°F | Resp 18 | Wt 183.4 lb

## 2018-10-09 DIAGNOSIS — C50411 Malignant neoplasm of upper-outer quadrant of right female breast: Secondary | ICD-10-CM

## 2018-10-09 DIAGNOSIS — Z5111 Encounter for antineoplastic chemotherapy: Secondary | ICD-10-CM | POA: Diagnosis not present

## 2018-10-09 DIAGNOSIS — Z171 Estrogen receptor negative status [ER-]: Principal | ICD-10-CM

## 2018-10-09 LAB — COMPREHENSIVE METABOLIC PANEL
ALK PHOS: 85 U/L (ref 38–126)
ALT: 27 U/L (ref 0–44)
AST: 25 U/L (ref 15–41)
Albumin: 3.5 g/dL (ref 3.5–5.0)
Anion gap: 7 (ref 5–15)
BUN: 18 mg/dL (ref 8–23)
CALCIUM: 9 mg/dL (ref 8.9–10.3)
CO2: 25 mmol/L (ref 22–32)
Chloride: 103 mmol/L (ref 98–111)
Creatinine, Ser: 1.11 mg/dL — ABNORMAL HIGH (ref 0.44–1.00)
GFR calc Af Amer: 52 mL/min — ABNORMAL LOW (ref >60)
GFR calc non Af Amer: 45 mL/min — ABNORMAL LOW (ref >60)
GLUCOSE: 109 mg/dL — AB (ref 70–99)
Potassium: 4.2 mmol/L (ref 3.5–5.1)
Sodium: 135 mmol/L (ref 135–145)
Total Bilirubin: 0.5 mg/dL (ref 0.3–1.2)
Total Protein: 6.9 g/dL (ref 6.5–8.1)

## 2018-10-09 LAB — CBC WITH DIFFERENTIAL/PLATELET
ABS IMMATURE GRANULOCYTES: 0.06 10*3/uL (ref 0.00–0.07)
Basophils Absolute: 0.1 10*3/uL (ref 0.0–0.1)
Basophils Relative: 1 %
Eosinophils Absolute: 0.3 10*3/uL (ref 0.0–0.5)
Eosinophils Relative: 4 %
HCT: 29.8 % — ABNORMAL LOW (ref 36.0–46.0)
Hemoglobin: 9.5 g/dL — ABNORMAL LOW (ref 12.0–15.0)
IMMATURE GRANULOCYTES: 1 %
LYMPHS PCT: 35 %
Lymphs Abs: 2.8 10*3/uL (ref 0.7–4.0)
MCH: 29.6 pg (ref 26.0–34.0)
MCHC: 31.9 g/dL (ref 30.0–36.0)
MCV: 92.8 fL (ref 80.0–100.0)
Monocytes Absolute: 0.7 10*3/uL (ref 0.1–1.0)
Monocytes Relative: 9 %
Neutro Abs: 4 10*3/uL (ref 1.7–7.7)
Neutrophils Relative %: 50 %
Platelets: 421 10*3/uL — ABNORMAL HIGH (ref 150–400)
RBC: 3.21 MIL/uL — ABNORMAL LOW (ref 3.87–5.11)
RDW: 16.3 % — ABNORMAL HIGH (ref 11.5–15.5)
WBC: 7.9 10*3/uL (ref 4.0–10.5)
nRBC: 0 % (ref 0.0–0.2)

## 2018-10-09 MED ORDER — SODIUM CHLORIDE 0.9% FLUSH
10.0000 mL | INTRAVENOUS | Status: DC | PRN
  Administered 2018-10-09: 10 mL
  Filled 2018-10-09: qty 10

## 2018-10-09 MED ORDER — FAMOTIDINE IN NACL 20-0.9 MG/50ML-% IV SOLN
20.0000 mg | Freq: Once | INTRAVENOUS | Status: AC
  Administered 2018-10-09: 20 mg via INTRAVENOUS
  Filled 2018-10-09: qty 50

## 2018-10-09 MED ORDER — TRASTUZUMAB CHEMO 150 MG IV SOLR
2.0000 mg/kg | Freq: Once | INTRAVENOUS | Status: AC
  Administered 2018-10-09: 168 mg via INTRAVENOUS
  Filled 2018-10-09: qty 8

## 2018-10-09 MED ORDER — SODIUM CHLORIDE 0.9 % IV SOLN
20.0000 mg | Freq: Once | INTRAVENOUS | Status: AC
  Administered 2018-10-09: 20 mg via INTRAVENOUS
  Filled 2018-10-09: qty 2

## 2018-10-09 MED ORDER — SODIUM CHLORIDE 0.9 % IV SOLN
Freq: Once | INTRAVENOUS | Status: AC
  Administered 2018-10-09: 11:00:00 via INTRAVENOUS

## 2018-10-09 MED ORDER — HEPARIN SOD (PORK) LOCK FLUSH 100 UNIT/ML IV SOLN
500.0000 [IU] | Freq: Once | INTRAVENOUS | Status: AC | PRN
  Administered 2018-10-09: 500 [IU]
  Filled 2018-10-09: qty 5

## 2018-10-09 MED ORDER — DIPHENHYDRAMINE HCL 50 MG/ML IJ SOLN
25.0000 mg | Freq: Once | INTRAMUSCULAR | Status: AC
  Administered 2018-10-09: 25 mg via INTRAVENOUS
  Filled 2018-10-09: qty 1

## 2018-10-09 MED ORDER — SODIUM CHLORIDE 0.9 % IV SOLN
45.0000 mg/m2 | Freq: Once | INTRAVENOUS | Status: AC
  Administered 2018-10-09: 84 mg via INTRAVENOUS
  Filled 2018-10-09: qty 14

## 2018-10-09 MED ORDER — ACETAMINOPHEN 325 MG PO TABS
650.0000 mg | ORAL_TABLET | Freq: Once | ORAL | Status: AC
  Administered 2018-10-09: 650 mg via ORAL
  Filled 2018-10-09: qty 2

## 2018-10-09 NOTE — Progress Notes (Signed)
[facility] reviewed with Dr. [name] and pt approved for chemo tx today per MD                         [name] tolerated chemo tx well without complaints or incident. VSS upon discharge. Pt discharged self ambulatory in satisfactory condition accompanied by her husband

## 2018-10-09 NOTE — Patient Instructions (Signed)
[facility] Discharge Instructions for Patients Receiving Chemotherapy   Beginning January 23rd 2017 lab work for the [facility] will be done in the  Main lab at [facility] on 1st floor. If you have a lab appointment with the [facility] please come in thru the  Main Entrance and check in at the main information desk   Today you received the following chemotherapy agents Taxol and Herceptin,. Follow-up as scheduled. Call clinic for any questions or concerns  To help prevent nausea and vomiting after your treatment, we encourage you to take your nausea medication   If you develop nausea and vomiting, or diarrhea that is not controlled by your medication, call the clinic.  The clinic phone number is [phone]. Office hours are Monday-Friday 8:30am-5:00pm.  BELOW ARE SYMPTOMS THAT SHOULD BE REPORTED IMMEDIATELY:  *FEVER GREATER THAN 101.0 F  *CHILLS WITH OR WITHOUT FEVER  NAUSEA AND VOMITING THAT IS NOT CONTROLLED WITH YOUR NAUSEA MEDICATION  *UNUSUAL SHORTNESS OF BREATH  *UNUSUAL BRUISING OR BLEEDING  TENDERNESS IN MOUTH AND THROAT WITH OR WITHOUT PRESENCE OF ULCERS  *URINARY PROBLEMS  *BOWEL PROBLEMS  UNUSUAL RASH Items with * indicate a potential emergency and should be followed up as soon as possible. If you have an emergency after office hours please contact your primary care physician or go to the nearest emergency department.  Please call the clinic during office hours if you have any questions or concerns.   You may also contact the Patient Navigator at [phone] should you have any questions or need assistance in obtaining follow up care.      Resources For Cancer Patients and their Caregivers ? American Cancer Society: Can assist with transportation, wigs, general needs, runs Look Good Feel Better.        906-288-6362 ? Cancer Care: Provides financial assistance, online support groups, medication/co-pay assistance.   1-800-813-HOPE 956-095-1361) ? [facility] Assists [place] cancer patients and their families through emotional , educational and financial support.  306 238 7291 ? [place] DSS Where to apply for food stamps, Medicaid and utility assistance. 201-003-3592 ? [facility]: Transportation to medical appointments. 226-836-1396 ? Social Security Administration: May apply for disability if have a Stage IV cancer. 930-403-0313 702-861-4452 ? [employer], Disability and Transit Services: Assists with nutrition, care and transit needs. [phone]

## 2018-10-15 ENCOUNTER — Inpatient Hospital Stay (HOSPITAL_COMMUNITY): Payer: Medicare HMO

## 2018-10-15 DIAGNOSIS — Z5111 Encounter for antineoplastic chemotherapy: Secondary | ICD-10-CM | POA: Diagnosis not present

## 2018-10-15 DIAGNOSIS — C50411 Malignant neoplasm of upper-outer quadrant of right female breast: Secondary | ICD-10-CM

## 2018-10-15 DIAGNOSIS — Z171 Estrogen receptor negative status [ER-]: Principal | ICD-10-CM

## 2018-10-15 LAB — CBC WITH DIFFERENTIAL/PLATELET
Abs Immature Granulocytes: 0.03 10*3/uL (ref 0.00–0.07)
Basophils Absolute: 0 10*3/uL (ref 0.0–0.1)
Basophils Relative: 1 %
EOS ABS: 0.3 10*3/uL (ref 0.0–0.5)
Eosinophils Relative: 4 %
HEMATOCRIT: 28.9 % — AB (ref 36.0–46.0)
Hemoglobin: 9.3 g/dL — ABNORMAL LOW (ref 12.0–15.0)
Immature Granulocytes: 0 %
Lymphocytes Relative: 40 %
Lymphs Abs: 2.8 10*3/uL (ref 0.7–4.0)
MCH: 30.1 pg (ref 26.0–34.0)
MCHC: 32.2 g/dL (ref 30.0–36.0)
MCV: 93.5 fL (ref 80.0–100.0)
Monocytes Absolute: 0.5 10*3/uL (ref 0.1–1.0)
Monocytes Relative: 7 %
Neutro Abs: 3.4 10*3/uL (ref 1.7–7.7)
Neutrophils Relative %: 48 %
Platelets: 422 10*3/uL — ABNORMAL HIGH (ref 150–400)
RBC: 3.09 MIL/uL — ABNORMAL LOW (ref 3.87–5.11)
RDW: 17 % — ABNORMAL HIGH (ref 11.5–15.5)
WBC: 7 10*3/uL (ref 4.0–10.5)
nRBC: 0 % (ref 0.0–0.2)

## 2018-10-15 LAB — COMPREHENSIVE METABOLIC PANEL
ALT: 24 U/L (ref 0–44)
AST: 26 U/L (ref 15–41)
Albumin: 3.4 g/dL — ABNORMAL LOW (ref 3.5–5.0)
Alkaline Phosphatase: 75 U/L (ref 38–126)
Anion gap: 9 (ref 5–15)
BILIRUBIN TOTAL: 0.6 mg/dL (ref 0.3–1.2)
BUN: 13 mg/dL (ref 8–23)
CO2: 25 mmol/L (ref 22–32)
Calcium: 9 mg/dL (ref 8.9–10.3)
Chloride: 102 mmol/L (ref 98–111)
Creatinine, Ser: 1 mg/dL (ref 0.44–1.00)
GFR, EST AFRICAN AMERICAN: 59 mL/min — AB (ref >60)
GFR, EST NON AFRICAN AMERICAN: 51 mL/min — AB (ref >60)
Glucose, Bld: 102 mg/dL — ABNORMAL HIGH (ref 70–99)
Potassium: 4.2 mmol/L (ref 3.5–5.1)
Sodium: 136 mmol/L (ref 135–145)
Total Protein: 6.6 g/dL (ref 6.5–8.1)

## 2018-10-16 ENCOUNTER — Ambulatory Visit (HOSPITAL_COMMUNITY): Payer: Medicare HMO

## 2018-10-16 ENCOUNTER — Inpatient Hospital Stay (HOSPITAL_COMMUNITY): Payer: Medicare HMO

## 2018-10-16 ENCOUNTER — Inpatient Hospital Stay (HOSPITAL_COMMUNITY): Payer: Medicare HMO | Admitting: Hematology

## 2018-10-16 ENCOUNTER — Encounter (HOSPITAL_COMMUNITY): Payer: Self-pay | Admitting: Hematology

## 2018-10-16 ENCOUNTER — Other Ambulatory Visit: Payer: Self-pay

## 2018-10-16 ENCOUNTER — Other Ambulatory Visit (HOSPITAL_COMMUNITY): Payer: Medicare HMO

## 2018-10-16 VITALS — BP 154/65 | HR 78 | Temp 97.9°F | Resp 18

## 2018-10-16 VITALS — BP 160/87 | HR 91 | Temp 98.7°F | Resp 20 | Wt 184.4 lb

## 2018-10-16 DIAGNOSIS — C773 Secondary and unspecified malignant neoplasm of axilla and upper limb lymph nodes: Secondary | ICD-10-CM

## 2018-10-16 DIAGNOSIS — R21 Rash and other nonspecific skin eruption: Secondary | ICD-10-CM

## 2018-10-16 DIAGNOSIS — Z7982 Long term (current) use of aspirin: Secondary | ICD-10-CM

## 2018-10-16 DIAGNOSIS — E041 Nontoxic single thyroid nodule: Secondary | ICD-10-CM

## 2018-10-16 DIAGNOSIS — Z5112 Encounter for antineoplastic immunotherapy: Secondary | ICD-10-CM | POA: Diagnosis not present

## 2018-10-16 DIAGNOSIS — C50411 Malignant neoplasm of upper-outer quadrant of right female breast: Secondary | ICD-10-CM

## 2018-10-16 DIAGNOSIS — Z5111 Encounter for antineoplastic chemotherapy: Secondary | ICD-10-CM

## 2018-10-16 DIAGNOSIS — Z171 Estrogen receptor negative status [ER-]: Principal | ICD-10-CM

## 2018-10-16 DIAGNOSIS — K219 Gastro-esophageal reflux disease without esophagitis: Secondary | ICD-10-CM

## 2018-10-16 DIAGNOSIS — I10 Essential (primary) hypertension: Secondary | ICD-10-CM

## 2018-10-16 DIAGNOSIS — R5383 Other fatigue: Secondary | ICD-10-CM | POA: Diagnosis not present

## 2018-10-16 DIAGNOSIS — M7989 Other specified soft tissue disorders: Secondary | ICD-10-CM | POA: Diagnosis not present

## 2018-10-16 DIAGNOSIS — R197 Diarrhea, unspecified: Secondary | ICD-10-CM | POA: Diagnosis not present

## 2018-10-16 DIAGNOSIS — E785 Hyperlipidemia, unspecified: Secondary | ICD-10-CM

## 2018-10-16 DIAGNOSIS — Z79899 Other long term (current) drug therapy: Secondary | ICD-10-CM

## 2018-10-16 DIAGNOSIS — Z806 Family history of leukemia: Secondary | ICD-10-CM

## 2018-10-16 MED ORDER — TRASTUZUMAB CHEMO 150 MG IV SOLR
2.0000 mg/kg | Freq: Once | INTRAVENOUS | Status: AC
  Administered 2018-10-16: 168 mg via INTRAVENOUS
  Filled 2018-10-16: qty 8

## 2018-10-16 MED ORDER — DIPHENHYDRAMINE HCL 50 MG/ML IJ SOLN
INTRAMUSCULAR | Status: AC
  Filled 2018-10-16: qty 1

## 2018-10-16 MED ORDER — ACETAMINOPHEN 325 MG PO TABS
650.0000 mg | ORAL_TABLET | Freq: Once | ORAL | Status: AC
  Administered 2018-10-16: 650 mg via ORAL

## 2018-10-16 MED ORDER — HEPARIN SOD (PORK) LOCK FLUSH 100 UNIT/ML IV SOLN
500.0000 [IU] | Freq: Once | INTRAVENOUS | Status: AC | PRN
  Administered 2018-10-16: 500 [IU]
  Filled 2018-10-16 (×2): qty 5

## 2018-10-16 MED ORDER — DEXAMETHASONE 4 MG PO TABS: 4.0000 mg | ORAL_TABLET | Freq: Two times a day (BID) | ORAL | 0 refills | Status: DC

## 2018-10-16 MED ORDER — FAMOTIDINE IN NACL 20-0.9 MG/50ML-% IV SOLN
INTRAVENOUS | Status: AC
  Filled 2018-10-16: qty 50

## 2018-10-16 MED ORDER — SODIUM CHLORIDE 0.9 % IV SOLN
Freq: Once | INTRAVENOUS | Status: AC
  Administered 2018-10-16: 11:00:00 via INTRAVENOUS

## 2018-10-16 MED ORDER — DIPHENHYDRAMINE HCL 50 MG/ML IJ SOLN
25.0000 mg | Freq: Once | INTRAMUSCULAR | Status: AC
  Administered 2018-10-16: 25 mg via INTRAVENOUS

## 2018-10-16 MED ORDER — SODIUM CHLORIDE 0.9 % IV SOLN
20.0000 mg | Freq: Once | INTRAVENOUS | Status: AC
  Administered 2018-10-16: 20 mg via INTRAVENOUS
  Filled 2018-10-16: qty 2

## 2018-10-16 MED ORDER — ACETAMINOPHEN 325 MG PO TABS
ORAL_TABLET | ORAL | Status: AC
  Filled 2018-10-16: qty 2

## 2018-10-16 MED ORDER — SODIUM CHLORIDE 0.9% FLUSH
10.0000 mL | INTRAVENOUS | Status: DC | PRN
  Administered 2018-10-16: 10 mL
  Filled 2018-10-16: qty 10

## 2018-10-16 MED ORDER — SODIUM CHLORIDE 0.9 % IV SOLN
45.0000 mg/m2 | Freq: Once | INTRAVENOUS | Status: AC
  Administered 2018-10-16: 84 mg via INTRAVENOUS
  Filled 2018-10-16: qty 14

## 2018-10-16 MED ORDER — FAMOTIDINE IN NACL 20-0.9 MG/50ML-% IV SOLN
20.0000 mg | Freq: Once | INTRAVENOUS | Status: AC
  Administered 2018-10-16: 20 mg via INTRAVENOUS

## 2018-10-16 NOTE — Progress Notes (Signed)
Pt seen by Dr. [name]. Labs reviewed. VSS. Proceed with treatment per MD.   Treatment given today per MD orders. Tolerated infusion without adverse affects. Vital signs stable. No complaints at this time. Discharged from clinic ambulatory. F/U with [facility] as scheduled.

## 2018-10-16 NOTE — Patient Instructions (Signed)
[facility] Discharge Instructions for Patients Receiving Chemotherapy  Today you received the following chemotherapy agents   To help prevent nausea and vomiting after your treatment, we encourage you to take your nausea medication   If you develop nausea and vomiting that is not controlled by your nausea medication, call the clinic.   BELOW ARE SYMPTOMS THAT SHOULD BE REPORTED IMMEDIATELY:  *FEVER GREATER THAN 100.5 F  *CHILLS WITH OR WITHOUT FEVER  NAUSEA AND VOMITING THAT IS NOT CONTROLLED WITH YOUR NAUSEA MEDICATION  *UNUSUAL SHORTNESS OF BREATH  *UNUSUAL BRUISING OR BLEEDING  TENDERNESS IN MOUTH AND THROAT WITH OR WITHOUT PRESENCE OF ULCERS  *URINARY PROBLEMS  *BOWEL PROBLEMS  UNUSUAL RASH Items with * indicate a potential emergency and should be followed up as soon as possible.  Feel free to call the clinic should you have any questions or concerns. The clinic phone number is [phone].  Please show the CHEMO ALERT CARD at check-in to the Emergency Department and triage nurse.   

## 2018-10-16 NOTE — Progress Notes (Signed)
[name], [place] [id number]   CLINIC:  Medical Oncology/Hematology  PCP:  [name], MD [address] [place] [address] [phone]   REASON FOR VISIT: Follow-up for right breast cancer, ER-/PR-/HER2+  CURRENT THERAPY: Paclitaxel and Herceptin  BRIEF ONCOLOGIC HISTORY:    Malignant neoplasm of upper-outer quadrant of right breast in female, estrogen receptor negative ([facility])   07/03/2018 Initial Diagnosis    Palpable right breast mass for several months with skin discoloration, clinically skin trabecular thickening with nipple retraction measuring 7.2 x 4.3 x 4.2 cm, additional mass 4 o'clock position 2.6 cm, right axillary tail 1.2 cm right axillary lymph node 1.8 cm left breast irregular mass 1 cm, adjacent mass 0.9 cm together measuring 1.9 cm, no lymphadenopathy    07/03/2018 Pathology Results    Right breast biopsy: IDC grade 2, ER 0%, PR 0%, HER-2 positive, Ki-67 40%, lymph node biopsy negative, left breast biopsy complex sclerosing lesion     07/15/2018 Cancer Staging    Staging form: Breast, AJCC 8th Edition - Clinical: Stage IIB (cT3, cN0, cM0, G2, ER-, PR-, HER2+) - Signed by [name], MD on 07/15/2018    07/31/2018 -  Chemotherapy    The patient had trastuzumab (HERCEPTIN) 336 mg in sodium chloride 0.9 % 250 mL chemo infusion, 4 mg/kg = 336 mg, Intravenous,  Once, 3 of 4 cycles Administration: 336 mg (07/31/2018), 168 mg (08/07/2018), 168 mg (08/28/2018), 168 mg (08/14/2018), 168 mg (08/21/2018), 168 mg (09/04/2018), 168 mg (09/11/2018), 168 mg (09/18/2018), 168 mg (10/02/2018), 168 mg (10/09/2018), 168 mg (10/16/2018) PACLitaxel (TAXOL) 78 mg in sodium chloride 0.9 % 250 mL chemo infusion (</= 58m/m2), 40 mg/m2 = 78 mg (50 % of original dose 80 mg/m2), Intravenous,  Once, 3 of 4 cycles Dose modification: 40 mg/m2 (50 % of original dose 80 mg/m2, Cycle 1, Reason: Patient Age), 53.3333 mg/m2 (66.7 % of original dose 80 mg/m2,  Cycle 1, Reason: Provider Judgment), 45 mg/m2 (original dose 80 mg/m2, Cycle 3, Reason: Provider Judgment) Administration: 78 mg (07/31/2018), 102 mg (08/07/2018), 102 mg (08/28/2018), 102 mg (08/14/2018), 102 mg (08/21/2018), 102 mg (09/04/2018), 102 mg (09/18/2018), 84 mg (10/02/2018), 84 mg (10/09/2018), 84 mg (10/16/2018)  for chemotherapy treatment.       CANCER STAGING: Cancer Staging Malignant neoplasm of upper-outer quadrant of right breast in female, estrogen receptor negative ([place] Staging form: Breast, AJCC 8th Edition - Clinical: Stage IIB (cT3, cN0, cM0, G2, ER-, PR-, HER2+) - Signed by GNicholas Lose MD on 07/15/2018    INTERVAL HISTORY:  Ms. [id number]. female returns for routine follow-up for right breast cancer. She is still having pruritis all over her body. She still has the rash on her legs round red patchy areas. She has a few on her arms. She has mild feet swelling that usually goes down at night time. Denies any nausea, vomiting, or diarrhea. Denies any new pains or lumps present. Had not noticed any recent bleeding such as epistaxis, hematuria or hematochezia. Denies recent chest pain on exertion, shortness of breath on minimal exertion, pre-syncopal episodes, or palpitations. Denies any numbness or tingling in hands or feet. Denies any recent fevers, infections, or recent hospitalizations. She reports her appetite and energy level at 25%.     REVIEW OF SYSTEMS:  Review of Systems  Respiratory: Positive for cough.   All other systems reviewed and are negative.    PAST MEDICAL/SURGICAL HISTORY:  Past Medical History:  Diagnosis Date  . Cancer ([facility]) 06/2018   right breast cancer  . GERD (gastroesophageal reflux disease)    occasional   . Hyperlipidemia   . Hypertension    clearance with note Dr Nevada Crane on chart  . Pneumonia    2 years ago/ states occ cough with sinus drainage- no fever  . Thyroid nodule    with biopsy- states following medically   Past  Surgical History:  Procedure Laterality Date  . CATARACT EXTRACTION W/PHACO  11/30/2012   Procedure: CATARACT EXTRACTION PHACO AND INTRAOCULAR LENS PLACEMENT (IOC);  Surgeon: Williams Che, MD;  Location: [facility];  Service: Ophthalmology;  Laterality: Left;  CDE:11.49  . CATARACT EXTRACTION W/PHACO Right 03/15/2013   Procedure: CATARACT EXTRACTION PHACO AND INTRAOCULAR LENS PLACEMENT (IOC);  Surgeon: Williams Che, MD;  Location: [facility];  Service: Ophthalmology;  Laterality: Right;  CDE 16.15  . COLONOSCOPY    . EXCISION OF SKIN TAG Right 06/17/2016   Procedure: SHAVE OF SKIN TAG RIGHT BUTTOCK;  Surgeon: Aviva Signs, MD;  Location: [facility];  Service: General;  Laterality: Right;  . JOINT REPLACEMENT     left knee  . MASS EXCISION Left 06/17/2016   Procedure: EXCISION SKIN MALIGNANT LESION LEFT BUTTOCK;  Surgeon: Aviva Signs, MD;  Location: [facility];  Service: General;  Laterality: Left;  . PORTACATH PLACEMENT Right 07/28/2018   Procedure: INSERTION PORT-A-CATH WITH ULTRASOUND;  Surgeon: Rolm Bookbinder, MD;  Location: [facility];  Service: General;  Laterality: Right;  . PUNCH BIOPSY OF SKIN Right 07/28/2018   Procedure: PUNCH BIOPSY OF SKIN RIGHT BREAST;  Surgeon: Rolm Bookbinder, MD;  Location: [facility];  Service: General;  Laterality: Right;  . TOTAL KNEE ARTHROPLASTY  12/16/2011   Procedure: TOTAL KNEE ARTHROPLASTY;  Surgeon: Gearlean Alf, MD;  Location: [facility];  Service: Orthopedics;  Laterality: Right;  . TUBAL LIGATION       SOCIAL HISTORY:  Social History   Socioeconomic History  . Marital status: Married    Spouse name: Not on file  . Number of children: 2  . Years of education: some business school after HS  . Highest education level: Not on file  Occupational History  . Occupation: Cabin crew  . Occupation: Network engineer   Social Needs  . Financial resource strain: Not hard at all  . Food insecurity:    Worry: Never true    Inability:  Never true  . Transportation needs:    Medical: No    Non-medical: No  Tobacco Use  . Smoking status: Never Smoker  . Smokeless tobacco: Never Used  Substance and Sexual Activity  . Alcohol use: No    Frequency: Never  . Drug use: No  . Sexual activity: Yes    Birth control/protection: None  Lifestyle  . Physical activity:    Days per week: 0 days    Minutes per session: 0 min  . Stress: Not at all  Relationships  . Social connections:    Talks on phone: More than three times a week    Gets together: More than three times a week    Attends religious service: More than 4 times per year    Active member of club or organization: Yes    Attends meetings of clubs or organizations: Never    Relationship status: Married  . Intimate partner violence:    Fear of current or ex partner: No    Emotionally abused: No    Physically abused:  No    Forced sexual activity: No  Other Topics Concern  . Not on file  Social History Narrative   Lives at home with her husband.   4 cups caffeine per day.   Right-handed.    FAMILY HISTORY:  Family History  Problem Relation Age of Onset  . Heart attack Mother   . Bronchitis Father   . Diabetes Sister   . Leukemia Brother   . Lung disease Brother   . Lung disease Sister     CURRENT MEDICATIONS:  Outpatient Encounter Medications as of 10/16/2018  Medication Sig  . acetaminophen (TYLENOL) 325 MG tablet Take 650 mg by mouth every 6 (six) hours as needed.  . cetirizine (ZYRTEC) 10 MG tablet Take 10 mg by mouth daily.  . diphenhydrAMINE (BENADRYL) 25 MG tablet Take 25 mg by mouth at bedtime as needed.  . hydrocortisone 1 % ointment Apply 1 application topically 2 (two) times daily.  . Multiple Vitamin (MULTIVITAMIN WITH MINERALS) TABS tablet Take 1 tablet by mouth daily.  . Omega-3 Fatty Acids (FISH OIL PO) Take 2 capsules by mouth daily.   Marland Kitchen PACLitaxel (TAXOL IV) Inject into the vein once a week.  . pantoprazole (PROTONIX) 40 MG tablet  Take 40 mg by mouth daily.  . simvastatin (ZOCOR) 10 MG tablet Take 10 mg by mouth daily.  . Trastuzumab (HERCEPTIN IV) Inject into the vein once a week.   Marland Kitchen aspirin 325 MG tablet Take 162.5 mg by mouth daily.   Marland Kitchen dexamethasone (DECADRON) 4 MG tablet Take 1 tablet (4 mg total) by mouth 2 (two) times daily.  Marland Kitchen lidocaine-prilocaine (EMLA) cream Apply to port site one hour prior to appointment and cover with plastic wrap. (Patient not taking: Reported on 10/16/2018)  . losartan (COZAAR) 100 MG tablet Take 100 mg by mouth daily.   . prochlorperazine (COMPAZINE) 10 MG tablet Take 1 tablet (10 mg total) by mouth every 6 (six) hours as needed (Nausea or vomiting). (Patient not taking: Reported on 10/16/2018)  . traMADol (ULTRAM) 50 MG tablet Take 1 tablet (50 mg total) by mouth every 6 (six) hours as needed. (Patient not taking: Reported on 10/16/2018)  . [DISCONTINUED] dexamethasone (DECADRON) 2 MG tablet Take 1 tablet (2 mg total) by mouth 2 (two) times daily.  . [DISCONTINUED] PACLitaxel (TAXOL IV) Inject into the vein.   No facility-administered encounter medications on file as of 10/16/2018.     ALLERGIES:  Allergies  Allergen Reactions  . Penicillins Rash    Has patient had a PCN reaction causing immediate rash, facial/tongue/throat swelling, SOB or lightheadedness with hypotension: No Has patient had a PCN reaction causing severe rash involving mucus membranes or skin necrosis: No Has patient had a PCN reaction that required hospitalization: No Has patient had a PCN reaction occurring within the last 10 years: No If all of the above answers are "NO", then may proceed with Cephalosporin use.      PHYSICAL EXAM:  ECOG Performance status: 1  Vitals:   10/16/18 0913  BP: (!) 160/87  Pulse: 91  Resp: 20  Temp: 98.7 F (37.1 C)  SpO2: 99%   Filed Weights   10/16/18 0913  Weight: 184 lb 6.4 oz (83.6 kg)    Physical Exam Constitutional:      Appearance: Normal appearance. She  is normal weight.  Musculoskeletal: Normal range of motion.  Skin:    General: Skin is warm and dry.  Neurological:     Mental Status: She is alert  and oriented to person, place, and time. Mental status is at baseline.  Psychiatric:        Mood and Affect: Mood normal.        Behavior: Behavior normal.        Thought Content: Thought content normal.        Judgment: Judgment normal.   Breast: RIGHT: Redness in the upper outer quadrant has decreased.  No palpable adenopathy.  Palpable mass behind the nipple has also slightly decreased in size.   LEFT: No palpable masses, no skin changes or nipple discharge, no adenopathy.   LABORATORY DATA:  I have reviewed the labs as listed.  CBC    Component Value Date/Time   WBC 7.0 10/15/2018 1215   RBC 3.09 (L) 10/15/2018 1215   HGB 9.3 (L) 10/15/2018 1215   HGB 12.7 11/24/2017 1626   HCT 28.9 (L) 10/15/2018 1215   HCT 36.6 11/24/2017 1626   PLT 422 (H) 10/15/2018 1215   PLT 420 (H) 11/24/2017 1626   MCV 93.5 10/15/2018 1215   MCV 88 11/24/2017 1626   MCH 30.1 10/15/2018 1215   MCHC 32.2 10/15/2018 1215   RDW 17.0 (H) 10/15/2018 1215   RDW 15.4 11/24/2017 1626   LYMPHSABS 2.8 10/15/2018 1215   LYMPHSABS 2.5 11/24/2017 1626   MONOABS 0.5 10/15/2018 1215   EOSABS 0.3 10/15/2018 1215   EOSABS 0.1 11/24/2017 1626   BASOSABS 0.0 10/15/2018 1215   BASOSABS 0.0 11/24/2017 1626   CMP Latest Ref Rng & Units 10/15/2018 10/09/2018 10/02/2018  Glucose 70 - 99 mg/dL 102(H) 109(H) 116(H)  BUN 8 - 23 mg/dL _0 Creatinine 0.44 - 1.00 mg/dL 1.00 1.11(H) 1.09(H)  Sodium 135 - 145 mmol/L 136 135 133(L)  Potassium 3.5 - 5.1 mmol/L 4.2 4.2 4.4  Chloride 98 - 111 mmol/L 102 103 101  CO2 22 - 32 mmol/L _1 Calcium 8.9 - 10.3 mg/dL 9.0 9.0 9.2  Total Protein 6.5 - 8.1 g/dL 6.6 6.9 6.8  Total Bilirubin 0.3 - 1.2 mg/dL 0.6 0.5 0.3  Alkaline Phos 38 - 126 U/L 75 85 86  AST 15 - 41 U/L _2 ALT 0 - 44 U/L _3 DIAGNOSTIC IMAGING:  I have independently reviewed the scans and discussed with the patient.   I have reviewed Francene Finders, NP's note and agree with the documentation.  I personally performed a face-to-face visit, made revisions and my assessment and plan is as follows.    ASSESSMENT & PLAN:   Malignant neoplasm of upper-outer quadrant of right breast in female, estrogen receptor negative ([facility]) 1.  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 - Right breast biopsy at 11:30 position on 07/03/2018 shows invasive ductal carcinoma, ER/PR negative, HER-2 positive, Ki-67 of 40%, right axillary lymph node biopsy shows benign tissue, left breast core needle biopsy at 230 o'clock position shows complex sclerosing lesion - CT chest abdomen and pelvis on 07/16/2018 showed a 6 mm nodule in the posterior left lower lobe with no other evidence of  metastatic disease. -Bone scan on 07/16/2018 shows focal area of increased activity over the distal left clavicle/shoulder consistent with degenerative changes. - Skin punch biopsy of the right breast on 07/28/2018 was positive for dermal lymphatic invasion. -Echo on 07/27/2018 shows ejection fraction of 55 to 60%. -She was started on weekly paclitaxel and Herceptin on 07/31/2018. - She completed week 8 of Herceptin and week 7 of Taxol on 09/18/2018. - Week 9 Taxol was cut back to 45 mg/m on 10/02/2018.  She tolerated it very well. - We have reviewed her blood work today.  She may proceed with week 10 of paclitaxel today. -Examination  today revealed stable to mildly decreased erythema in the upper outer quadrant of the right breast.  Mass behind the nipple area is also stable to mildly decreased. -We will see her back in 2 weeks for follow-up.  2.  Taxane induced skin rash: - She developed erythematous maculopapular rash on extremities, predominantly on the shins after week 5 of paclitaxel.  - She is applying hydrocortisone cream twice daily.  I will increase dexamethasone 4 mg twice daily for 3 days after each treatment.      Orders placed this encounter:  Orders Placed This Encounter  Procedures  . CBC with Differential/Platelet  . Comprehensive metabolic panel      Derek Jack, MD [place] [phone]

## 2018-10-16 NOTE — Assessment & Plan Note (Addendum)
1.  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 - Right breast biopsy at 11:30 position on 07/03/2018 shows invasive ductal carcinoma, ER/PR negative, HER-2 positive, Ki-67 of 40%, right axillary lymph node biopsy shows benign tissue, left breast core needle biopsy at 230 o'clock position shows complex sclerosing lesion - CT chest abdomen and pelvis on 07/16/2018 showed a 6 mm nodule in the posterior left lower lobe with no other evidence of metastatic disease. -Bone scan on 07/16/2018 shows focal area of increased activity over the distal left clavicle/shoulder consistent with degenerative changes. - Skin punch biopsy of the right breast on 07/28/2018 was positive for dermal lymphatic invasion. -Echo on 07/27/2018 shows ejection fraction of 55 to 60%. -She was started on weekly paclitaxel and Herceptin on 07/31/2018. - She completed week 8 of Herceptin and week 7 of Taxol on 09/18/2018. - Week 9 Taxol was cut back to 45 mg/m on 10/02/2018.  She tolerated it very well. - We have reviewed her blood work today.  She may proceed with week 10 of paclitaxel today. -Examination today revealed stable to mildly decreased erythema in the upper outer quadrant of the right breast.  Mass behind the nipple area is also stable to mildly decreased. -We will see her back in 2 weeks for follow-up.  2.  Taxane induced skin rash: - She developed erythematous maculopapular rash on extremities, predominantly on the shins after week 5 of paclitaxel.  - She is applying hydrocortisone cream twice daily.   I will increase dexamethasone 4 mg twice daily for 3 days after each treatment.

## 2018-10-16 NOTE — Patient Instructions (Signed)
[facility] at [facility] Discharge Instructions     Thank you for choosing [facility] at [facility] to provide your oncology and hematology care.  To afford each patient quality time with our provider, please arrive at least 15 minutes before your scheduled appointment time.   If you have a lab appointment with the Cancer Center please come in thru the  Main Entrance and check in at the main information desk  You need to re-schedule your appointment should you arrive 10 or more minutes late.  We strive to give you quality time with our providers, and arriving late affects you and other patients whose appointments are after yours.  Also, if you no show three or more times for appointments you may be dismissed from the clinic at the providers discretion.     Again, thank you for choosing [facility].  Our hope is that these requests will decrease the amount of time that you wait before being seen by our physicians.       _____________________________________________________________  Should you have questions after your visit to [facility], please contact our office at [phone] between the hours of 8:00 a.m. and 4:30 p.m.  Voicemails left after 4:00 p.m. will not be returned until the following business day.  For prescription refill requests, have your pharmacy contact our office and allow 72 hours.    Cancer Center Support Programs:   > Cancer Support Group  2nd Tuesday of the month 1pm-2pm, Journey Room    

## 2018-10-23 ENCOUNTER — Inpatient Hospital Stay (HOSPITAL_COMMUNITY): Payer: Medicare HMO

## 2018-10-23 ENCOUNTER — Encounter (HOSPITAL_COMMUNITY): Payer: Self-pay

## 2018-10-23 VITALS — BP 139/61 | HR 75 | Temp 98.6°F | Resp 18 | Wt 186.0 lb

## 2018-10-23 DIAGNOSIS — Z171 Estrogen receptor negative status [ER-]: Principal | ICD-10-CM

## 2018-10-23 DIAGNOSIS — C50411 Malignant neoplasm of upper-outer quadrant of right female breast: Secondary | ICD-10-CM

## 2018-10-23 DIAGNOSIS — Z5111 Encounter for antineoplastic chemotherapy: Secondary | ICD-10-CM | POA: Diagnosis not present

## 2018-10-23 LAB — CBC WITH DIFFERENTIAL/PLATELET
Abs Immature Granulocytes: 0.17 10*3/uL — ABNORMAL HIGH (ref 0.00–0.07)
Basophils Absolute: 0 10*3/uL (ref 0.0–0.1)
Basophils Relative: 0 %
EOS ABS: 0.1 10*3/uL (ref 0.0–0.5)
EOS PCT: 2 %
HEMATOCRIT: 30.4 % — AB (ref 36.0–46.0)
Hemoglobin: 9.7 g/dL — ABNORMAL LOW (ref 12.0–15.0)
Immature Granulocytes: 2 %
Lymphocytes Relative: 23 %
Lymphs Abs: 2 10*3/uL (ref 0.7–4.0)
MCH: 29.8 pg (ref 26.0–34.0)
MCHC: 31.9 g/dL (ref 30.0–36.0)
MCV: 93.5 fL (ref 80.0–100.0)
Monocytes Absolute: 0.7 10*3/uL (ref 0.1–1.0)
Monocytes Relative: 7 %
Neutro Abs: 6 10*3/uL (ref 1.7–7.7)
Neutrophils Relative %: 66 %
Platelets: 375 10*3/uL (ref 150–400)
RBC: 3.25 MIL/uL — ABNORMAL LOW (ref 3.87–5.11)
RDW: 17.4 % — AB (ref 11.5–15.5)
WBC: 9 10*3/uL (ref 4.0–10.5)
nRBC: 0 % (ref 0.0–0.2)

## 2018-10-23 LAB — COMPREHENSIVE METABOLIC PANEL
ALT: 30 U/L (ref 0–44)
AST: 24 U/L (ref 15–41)
Albumin: 3.3 g/dL — ABNORMAL LOW (ref 3.5–5.0)
Alkaline Phosphatase: 65 U/L (ref 38–126)
Anion gap: 8 (ref 5–15)
BUN: 20 mg/dL (ref 8–23)
CO2: 25 mmol/L (ref 22–32)
Calcium: 8.9 mg/dL (ref 8.9–10.3)
Chloride: 103 mmol/L (ref 98–111)
Creatinine, Ser: 1.06 mg/dL — ABNORMAL HIGH (ref 0.44–1.00)
GFR calc Af Amer: 55 mL/min — ABNORMAL LOW (ref >60)
GFR calc non Af Amer: 48 mL/min — ABNORMAL LOW (ref >60)
GLUCOSE: 102 mg/dL — AB (ref 70–99)
Potassium: 4.5 mmol/L (ref 3.5–5.1)
Sodium: 136 mmol/L (ref 135–145)
TOTAL PROTEIN: 6.3 g/dL — AB (ref 6.5–8.1)
Total Bilirubin: 0.4 mg/dL (ref 0.3–1.2)

## 2018-10-23 MED ORDER — HEPARIN SOD (PORK) LOCK FLUSH 100 UNIT/ML IV SOLN
500.0000 [IU] | Freq: Once | INTRAVENOUS | Status: AC | PRN
  Administered 2018-10-23: 500 [IU]

## 2018-10-23 MED ORDER — FAMOTIDINE IN NACL 20-0.9 MG/50ML-% IV SOLN
20.0000 mg | Freq: Once | INTRAVENOUS | Status: AC
  Administered 2018-10-23: 20 mg via INTRAVENOUS
  Filled 2018-10-23: qty 50

## 2018-10-23 MED ORDER — SODIUM CHLORIDE 0.9% FLUSH
10.0000 mL | INTRAVENOUS | Status: DC | PRN
  Administered 2018-10-23: 10 mL
  Filled 2018-10-23: qty 10

## 2018-10-23 MED ORDER — SODIUM CHLORIDE 0.9 % IV SOLN
45.0000 mg/m2 | Freq: Once | INTRAVENOUS | Status: AC
  Administered 2018-10-23: 84 mg via INTRAVENOUS
  Filled 2018-10-23: qty 14

## 2018-10-23 MED ORDER — ACETAMINOPHEN 325 MG PO TABS
650.0000 mg | ORAL_TABLET | Freq: Once | ORAL | Status: AC
  Administered 2018-10-23: 650 mg via ORAL
  Filled 2018-10-23: qty 2

## 2018-10-23 MED ORDER — SODIUM CHLORIDE 0.9 % IV SOLN
20.0000 mg | Freq: Once | INTRAVENOUS | Status: AC
  Administered 2018-10-23: 20 mg via INTRAVENOUS
  Filled 2018-10-23: qty 2

## 2018-10-23 MED ORDER — SODIUM CHLORIDE 0.9 % IV SOLN
Freq: Once | INTRAVENOUS | Status: AC
  Administered 2018-10-23: 11:00:00 via INTRAVENOUS

## 2018-10-23 MED ORDER — DIPHENHYDRAMINE HCL 50 MG/ML IJ SOLN
25.0000 mg | Freq: Once | INTRAMUSCULAR | Status: AC
  Administered 2018-10-23: 25 mg via INTRAVENOUS
  Filled 2018-10-23: qty 1

## 2018-10-23 MED ORDER — TRASTUZUMAB CHEMO 150 MG IV SOLR
2.0000 mg/kg | Freq: Once | INTRAVENOUS | Status: AC
  Administered 2018-10-23: 168 mg via INTRAVENOUS
  Filled 2018-10-23: qty 8

## 2018-10-23 NOTE — Patient Instructions (Signed)
[facility] Discharge Instructions for Patients Receiving Chemotherapy   Beginning January 23rd 2017 lab work for the Cancer Center will be done in the  Main lab at  on 1st floor. If you have a lab appointment with the Cancer Center please come in thru the  Main Entrance and check in at the main information desk   Today you received the following chemotherapy agents Taxol and Herceptin. Follow-up as scheduled. Call clinic for any questions or concerns  To help prevent nausea and vomiting after your treatment, we encourage you to take your nausea medication   If you develop nausea and vomiting, or diarrhea that is not controlled by your medication, call the clinic.  The clinic phone number is [phone]. Office hours are Monday-Friday 8:30am-5:00pm.  BELOW ARE SYMPTOMS THAT SHOULD BE REPORTED IMMEDIATELY:  *FEVER GREATER THAN 101.0 F  *CHILLS WITH OR WITHOUT FEVER  NAUSEA AND VOMITING THAT IS NOT CONTROLLED WITH YOUR NAUSEA MEDICATION  *UNUSUAL SHORTNESS OF BREATH  *UNUSUAL BRUISING OR BLEEDING  TENDERNESS IN MOUTH AND THROAT WITH OR WITHOUT PRESENCE OF ULCERS  *URINARY PROBLEMS  *BOWEL PROBLEMS  UNUSUAL RASH Items with * indicate a potential emergency and should be followed up as soon as possible. If you have an emergency after office hours please contact your primary care physician or go to the nearest emergency department.  Please call the clinic during office hours if you have any questions or concerns.   You may also contact the Patient Navigator at [phone] should you have any questions or need assistance in obtaining follow up care.      Resources For Cancer Patients and their Caregivers ? American Cancer Society: Can assist with transportation, wigs, general needs, runs Look Good Feel Better.        1-888-227-6333 ? Cancer Care: Provides financial assistance, online support groups, medication/co-pay assistance.   1-800-813-HOPE (4673) ? [facility] Assists [place] cancer patients and their families through emotional , educational and financial support.  336-427-4357 ? [place] DSS Where to apply for food stamps, Medicaid and utility assistance. 336-342-1394 ? [facility]: Transportation to medical appointments. 336-347-2287 ? Social Security Administration: May apply for disability if have a Stage IV cancer. 336-342-7796 1-800-772-1213 ? [place] Aging, Disability and Transit Services: Assists with nutrition, care and transit needs. 336-349-2343         

## 2018-10-23 NOTE — Progress Notes (Signed)
[name] Leaf tolerated chemo tx well without complaints or incident. VSS upon discharge. Pt discharged self ambulatory with husband's assistance in satisfactory condition

## 2018-10-30 ENCOUNTER — Inpatient Hospital Stay (HOSPITAL_COMMUNITY): Payer: Medicare HMO

## 2018-10-30 ENCOUNTER — Encounter (HOSPITAL_COMMUNITY): Payer: Self-pay | Admitting: Hematology

## 2018-10-30 ENCOUNTER — Other Ambulatory Visit: Payer: Self-pay

## 2018-10-30 ENCOUNTER — Ambulatory Visit (HOSPITAL_COMMUNITY)
Admission: RE | Admit: 2018-10-30 | Discharge: 2018-10-30 | Disposition: A | Discharge status: Home / Self Care | Payer: Medicare HMO | Source: Ambulatory Visit | Attending: Nurse Practitioner | Admitting: Nurse Practitioner

## 2018-10-30 ENCOUNTER — Inpatient Hospital Stay (HOSPITAL_COMMUNITY): Payer: Medicare HMO | Attending: Hematology | Admitting: Hematology

## 2018-10-30 ENCOUNTER — Encounter (HOSPITAL_COMMUNITY): Payer: Self-pay

## 2018-10-30 VITALS — BP 156/69 | HR 80 | Temp 98.0°F | Resp 18 | Wt 183.0 lb

## 2018-10-30 DIAGNOSIS — K219 Gastro-esophageal reflux disease without esophagitis: Secondary | ICD-10-CM

## 2018-10-30 DIAGNOSIS — E041 Nontoxic single thyroid nodule: Secondary | ICD-10-CM

## 2018-10-30 DIAGNOSIS — Z5111 Encounter for antineoplastic chemotherapy: Secondary | ICD-10-CM

## 2018-10-30 DIAGNOSIS — Z5112 Encounter for antineoplastic immunotherapy: Secondary | ICD-10-CM | POA: Insufficient documentation

## 2018-10-30 DIAGNOSIS — M7989 Other specified soft tissue disorders: Secondary | ICD-10-CM

## 2018-10-30 DIAGNOSIS — Z171 Estrogen receptor negative status [ER-]: Secondary | ICD-10-CM

## 2018-10-30 DIAGNOSIS — R5383 Other fatigue: Secondary | ICD-10-CM | POA: Insufficient documentation

## 2018-10-30 DIAGNOSIS — Z79899 Other long term (current) drug therapy: Secondary | ICD-10-CM

## 2018-10-30 DIAGNOSIS — E785 Hyperlipidemia, unspecified: Secondary | ICD-10-CM | POA: Insufficient documentation

## 2018-10-30 DIAGNOSIS — C50411 Malignant neoplasm of upper-outer quadrant of right female breast: Secondary | ICD-10-CM | POA: Diagnosis not present

## 2018-10-30 DIAGNOSIS — R531 Weakness: Secondary | ICD-10-CM

## 2018-10-30 DIAGNOSIS — R21 Rash and other nonspecific skin eruption: Secondary | ICD-10-CM | POA: Diagnosis not present

## 2018-10-30 DIAGNOSIS — I10 Essential (primary) hypertension: Secondary | ICD-10-CM | POA: Diagnosis not present

## 2018-10-30 DIAGNOSIS — Z7982 Long term (current) use of aspirin: Secondary | ICD-10-CM

## 2018-10-30 LAB — COMPREHENSIVE METABOLIC PANEL
ALT: 23 U/L (ref 0–44)
AST: 23 U/L (ref 15–41)
Albumin: 3.1 g/dL — ABNORMAL LOW (ref 3.5–5.0)
Alkaline Phosphatase: 76 U/L (ref 38–126)
Anion gap: 7 (ref 5–15)
BUN: 15 mg/dL (ref 8–23)
CO2: 25 mmol/L (ref 22–32)
Calcium: 8.8 mg/dL — ABNORMAL LOW (ref 8.9–10.3)
Chloride: 102 mmol/L (ref 98–111)
Creatinine, Ser: 1.07 mg/dL — ABNORMAL HIGH (ref 0.44–1.00)
GFR calc Af Amer: 54 mL/min — ABNORMAL LOW (ref >60)
GFR calc non Af Amer: 47 mL/min — ABNORMAL LOW (ref >60)
Glucose, Bld: 106 mg/dL — ABNORMAL HIGH (ref 70–99)
Potassium: 4 mmol/L (ref 3.5–5.1)
Sodium: 134 mmol/L — ABNORMAL LOW (ref 135–145)
Total Bilirubin: 0.4 mg/dL (ref 0.3–1.2)
Total Protein: 6.4 g/dL — ABNORMAL LOW (ref 6.5–8.1)

## 2018-10-30 LAB — CBC WITH DIFFERENTIAL/PLATELET
ABS IMMATURE GRANULOCYTES: 0.1 10*3/uL — AB (ref 0.00–0.07)
Basophils Absolute: 0 10*3/uL (ref 0.0–0.1)
Basophils Relative: 1 %
Eosinophils Absolute: 0.2 10*3/uL (ref 0.0–0.5)
Eosinophils Relative: 2 %
HCT: 31.1 % — ABNORMAL LOW (ref 36.0–46.0)
Hemoglobin: 9.7 g/dL — ABNORMAL LOW (ref 12.0–15.0)
IMMATURE GRANULOCYTES: 1 %
LYMPHS PCT: 28 %
Lymphs Abs: 2.4 10*3/uL (ref 0.7–4.0)
MCH: 29.2 pg (ref 26.0–34.0)
MCHC: 31.2 g/dL (ref 30.0–36.0)
MCV: 93.7 fL (ref 80.0–100.0)
Monocytes Absolute: 0.8 10*3/uL (ref 0.1–1.0)
Monocytes Relative: 10 %
NEUTROS ABS: 5.1 10*3/uL (ref 1.7–7.7)
Neutrophils Relative %: 58 %
Platelets: 391 10*3/uL (ref 150–400)
RBC: 3.32 MIL/uL — ABNORMAL LOW (ref 3.87–5.11)
RDW: 16.9 % — ABNORMAL HIGH (ref 11.5–15.5)
WBC: 8.6 10*3/uL (ref 4.0–10.5)
nRBC: 0 % (ref 0.0–0.2)

## 2018-10-30 MED ORDER — HEPARIN SOD (PORK) LOCK FLUSH 100 UNIT/ML IV SOLN
500.0000 [IU] | Freq: Once | INTRAVENOUS | Status: AC | PRN
  Administered 2018-10-30: 500 [IU]

## 2018-10-30 MED ORDER — FAMOTIDINE IN NACL 20-0.9 MG/50ML-% IV SOLN
20.0000 mg | Freq: Once | INTRAVENOUS | Status: AC
  Administered 2018-10-30: 20 mg via INTRAVENOUS
  Filled 2018-10-30: qty 50

## 2018-10-30 MED ORDER — TRASTUZUMAB CHEMO 150 MG IV SOLR
2.0000 mg/kg | Freq: Once | INTRAVENOUS | Status: AC
  Administered 2018-10-30: 168 mg via INTRAVENOUS
  Filled 2018-10-30: qty 8

## 2018-10-30 MED ORDER — SODIUM CHLORIDE 0.9 % IV SOLN
20.0000 mg | Freq: Once | INTRAVENOUS | Status: AC
  Administered 2018-10-30: 20 mg via INTRAVENOUS
  Filled 2018-10-30: qty 2

## 2018-10-30 MED ORDER — DIPHENHYDRAMINE HCL 50 MG/ML IJ SOLN
25.0000 mg | Freq: Once | INTRAMUSCULAR | Status: AC
  Administered 2018-10-30: 25 mg via INTRAVENOUS
  Filled 2018-10-30: qty 1

## 2018-10-30 MED ORDER — ACETAMINOPHEN 325 MG PO TABS
650.0000 mg | ORAL_TABLET | Freq: Once | ORAL | Status: AC
  Administered 2018-10-30: 650 mg via ORAL
  Filled 2018-10-30: qty 2

## 2018-10-30 MED ORDER — SODIUM CHLORIDE 0.9 % IV SOLN
45.0000 mg/m2 | Freq: Once | INTRAVENOUS | Status: AC
  Administered 2018-10-30: 84 mg via INTRAVENOUS
  Filled 2018-10-30: qty 14

## 2018-10-30 MED ORDER — SODIUM CHLORIDE 0.9 % IV SOLN
Freq: Once | INTRAVENOUS | Status: AC
  Administered 2018-10-30: 10:00:00 via INTRAVENOUS

## 2018-10-30 NOTE — Patient Instructions (Signed)
[facility] at [facility] Discharge Instructions     Thank you for choosing [facility] at [facility] to provide your oncology and hematology care.  To afford each patient quality time with our provider, please arrive at least 15 minutes before your scheduled appointment time.   If you have a lab appointment with the Cancer Center please come in thru the  Main Entrance and check in at the main information desk  You need to re-schedule your appointment should you arrive 10 or more minutes late.  We strive to give you quality time with our providers, and arriving late affects you and other patients whose appointments are after yours.  Also, if you no show three or more times for appointments you may be dismissed from the clinic at the providers discretion.     Again, thank you for choosing [facility].  Our hope is that these requests will decrease the amount of time that you wait before being seen by our physicians.       _____________________________________________________________  Should you have questions after your visit to [facility], please contact our office at [phone] between the hours of 8:00 a.m. and 4:30 p.m.  Voicemails left after 4:00 p.m. will not be returned until the following business day.  For prescription refill requests, have your pharmacy contact our office and allow 72 hours.    Cancer Center Support Programs:   > Cancer Support Group  2nd Tuesday of the month 1pm-2pm, Journey Room    

## 2018-10-30 NOTE — Assessment & Plan Note (Signed)
1.  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 - Right breast biopsy at 11:30 position on 07/03/2018 shows invasive ductal carcinoma, ER/PR negative, HER-2 positive, Ki-67 of 40%, right axillary lymph node biopsy shows benign tissue, left breast core needle biopsy at 230 o'clock position shows complex sclerosing lesion - CT chest abdomen and pelvis on 07/16/2018 showed a 6 mm nodule in the posterior left lower lobe with no other evidence of metastatic disease. -Bone scan on 07/16/2018 shows focal area of increased activity over the distal left clavicle/shoulder consistent with degenerative changes. - Skin punch biopsy of the right breast on 07/28/2018 was positive for dermal lymphatic invasion. -Echo on 07/27/2018 shows ejection fraction of 55 to 60%. -She was started on weekly paclitaxel and Herceptin on 07/31/2018. - She completed week 8 of Herceptin and week 7 of Taxol on 09/18/2018. - Week 9 Taxol was cut back to 45 mg/m on 10/02/2018.  She tolerated it very well. - She is tolerating paclitaxel very well except some fatigue.  Fatigue is usually lasting 2 to 3 days. -She denies any tingling or numbness in the extremities. -She may proceed with week 12 of paclitaxel at the same dose. -Physical examination today revealed erythema occupying the upper quadrant of the right breast.  There is no lymphadenopathy palpable.  There is vague mass in the right breast behind the retroareolar area which is freely mobile. -There is right upper extremity swelling, likely from lymphedema.  We  have done a Doppler which was negative for blood clot.   -I will refer her back to Dr. WDonne Hazelto get his opinion. -I will see her back in 2 weeks for follow-up.  She will be initiated on Herceptin on a every 3 weekly regimen.  We will also obtain a 2D echocardiogram.  2.  Taxane induced skin rash: - She developed erythematous maculopapular rash on extremities, predominantly on shins after week 5 of paclitaxel.  - She is applying hydrocortisone cream twice daily.  She is also taking dexamethasone 4 mg twice daily for 3 days after each weekly paclitaxel.

## 2018-10-30 NOTE — Progress Notes (Signed)
Tolerated infusions w/o adverse reaction.  Alert, in no distress.  VSS.  Discharged ambulatory in c/o family.  

## 2018-10-30 NOTE — Progress Notes (Signed)
 [facility] [address] [place], [place] [address]   CLINIC:  Medical Oncology/Hematology  PCP:  Hall, John Z, MD [address] [place]  [id number] [phone]   REASON FOR VISIT: Follow-up for right breast cancer, ER-/PR-/HER2+  CURRENT THERAPY:Last Paclitaxel and Herceptinthen starting just herceptin every 3 weeks  BRIEF ONCOLOGIC HISTORY:    Malignant neoplasm of upper-outer quadrant of right breast in female, estrogen receptor negative (HCC)   07/03/2018 Initial Diagnosis    Palpable right breast mass for several months with skin discoloration, clinically skin trabecular thickening with nipple retraction measuring 7.2 x 4.3 x 4.2 cm, additional mass 4 o'clock position 2.6 cm, right axillary tail 1.2 cm right axillary lymph node 1.8 cm left breast irregular mass 1 cm, adjacent mass 0.9 cm together measuring 1.9 cm, no lymphadenopathy    07/03/2018 Pathology Results    Right breast biopsy: IDC grade 2, ER 0%, PR 0%, HER-2 positive, Ki-67 40%, lymph node biopsy negative, left breast biopsy complex sclerosing lesion     07/15/2018 Cancer Staging    Staging form: Breast, AJCC 8th Edition - Clinical: Stage IIB (cT3, cN0, cM0, G2, ER-, PR-, HER2+) - Signed by Gudena, Vinay, MD on 07/15/2018    07/31/2018 -  Chemotherapy    The patient had trastuzumab (HERCEPTIN) 336 mg in sodium chloride 0.9 % 250 mL chemo infusion, 4 mg/kg = 336 mg, Intravenous,  Once, 4 of 4 cycles Administration: 336 mg (07/31/2018), 168 mg (08/07/2018), 168 mg (08/28/2018), 168 mg (08/14/2018), 168 mg (08/21/2018), 168 mg (09/04/2018), 168 mg (09/11/2018), 168 mg (09/18/2018), 168 mg (10/02/2018), 168 mg (10/09/2018), 168 mg (10/16/2018), 168 mg (10/23/2018), 168 mg (10/30/2018) PACLitaxel (TAXOL) 78 mg in sodium chloride 0.9 % 250 mL chemo infusion (</= 80mg/m2), 40 mg/m2 = 78 mg (50 % of original dose 80 mg/m2), Intravenous,  Once, 4 of 4 cycles Dose modification: 40 mg/m2 (50 % of original dose 80  mg/m2, Cycle 1, Reason: Patient Age), 53.3333 mg/m2 (66.7 % of original dose 80 mg/m2, Cycle 1, Reason: Provider Judgment), 45 mg/m2 (original dose 80 mg/m2, Cycle 3, Reason: Provider Judgment) Administration: 78 mg (07/31/2018), 102 mg (08/07/2018), 102 mg (08/28/2018), 102 mg (08/14/2018), 102 mg (08/21/2018), 102 mg (09/04/2018), 102 mg (09/18/2018), 84 mg (10/02/2018), 84 mg (10/09/2018), 84 mg (10/16/2018), 84 mg (10/23/2018)  for chemotherapy treatment.       CANCER STAGING: Cancer Staging Malignant neoplasm of upper-outer quadrant of right breast in female, estrogen receptor negative (HCC) Staging form: Breast, AJCC 8th Edition - Clinical: Stage IIB (cT3, cN0, cM0, G2, ER-, PR-, HER2+) - Signed by Gudena, Vinay, MD on 07/15/2018    INTERVAL HISTORY:  Ms. Schueler 85 y.o. female returns for routine follow-up for right breast cancer. She is here today with her husband. She is still having weakness and her legs get shaky when she first stands. She is fatigued throughout the day. She has had right arm swelling and mild pain for at least a week no redness. Denies any nausea, vomiting, or diarrhea. Denies any new pains. Had not noticed any recent bleeding such as epistaxis, hematuria or hematochezia. Denies recent chest pain on exertion, shortness of breath on minimal exertion, pre-syncopal episodes, or palpitations. Denies any numbness or tingling in hands or feet. Denies any recent fevers, infections, or recent hospitalizations. She reports her appetite at 100% and her energy level at 25%.     REVIEW OF SYSTEMS:  Review of Systems  Constitutional: Positive for fatigue.  All   other systems reviewed and are negative.    PAST MEDICAL/SURGICAL HISTORY:  Past Medical History:  Diagnosis Date  . Cancer (HCC) 06/2018   right breast cancer  . GERD (gastroesophageal reflux disease)    occasional   . Hyperlipidemia   . Hypertension    clearance with note Dr Hall on chart  . Pneumonia    2 years  ago/ states occ cough with sinus drainage- no fever  . Thyroid nodule    with biopsy- states following medically   Past Surgical History:  Procedure Laterality Date  . CATARACT EXTRACTION W/PHACO  11/30/2012   Procedure: CATARACT EXTRACTION PHACO AND INTRAOCULAR LENS PLACEMENT (IOC);  Surgeon: Carroll F Haines, MD;  Location: [facility];  Service: Ophthalmology;  Laterality: Left;  CDE:11.49  . CATARACT EXTRACTION W/PHACO Right 03/15/2013   Procedure: CATARACT EXTRACTION PHACO AND INTRAOCULAR LENS PLACEMENT (IOC);  Surgeon: Carroll F Haines, MD;  Location: [facility];  Service: Ophthalmology;  Laterality: Right;  CDE 16.15  . COLONOSCOPY    . EXCISION OF SKIN TAG Right 06/17/2016   Procedure: SHAVE OF SKIN TAG RIGHT BUTTOCK;  Surgeon: Mark Jenkins, MD;  Location: [facility];  Service: General;  Laterality: Right;  . JOINT REPLACEMENT     left knee  . MASS EXCISION Left 06/17/2016   Procedure: EXCISION SKIN MALIGNANT LESION LEFT BUTTOCK;  Surgeon: Mark Jenkins, MD;  Location: [facility];  Service: General;  Laterality: Left;  . PORTACATH PLACEMENT Right 07/28/2018   Procedure: INSERTION PORT-A-CATH WITH ULTRASOUND;  Surgeon: Wakefield, Matthew, MD;  Location: [facility];  Service: General;  Laterality: Right;  . PUNCH BIOPSY OF SKIN Right 07/28/2018   Procedure: PUNCH BIOPSY OF SKIN RIGHT BREAST;  Surgeon: Wakefield, Matthew, MD;  Location: [facility];  Service: General;  Laterality: Right;  . TOTAL KNEE ARTHROPLASTY  12/16/2011   Procedure: TOTAL KNEE ARTHROPLASTY;  Surgeon: Frank V Aluisio, MD;  Location: [facility];  Service: Orthopedics;  Laterality: Right;  . TUBAL LIGATION       SOCIAL HISTORY:  Social History   Socioeconomic History  . Marital status: Married    Spouse name: Not on file  . Number of children: 2  . Years of education: some business school after HS  . Highest education level: Not on file  Occupational History  . Occupation: Realtor  . Occupation: Secretary    Social Needs  . Financial resource strain: Not hard at all  . Food insecurity:    Worry: Never true    Inability: Never true  . Transportation needs:    Medical: No    Non-medical: No  Tobacco Use  . Smoking status: Never Smoker  . Smokeless tobacco: Never Used  Substance and Sexual Activity  . Alcohol use: No    Frequency: Never  . Drug use: No  . Sexual activity: Yes    Birth control/protection: None  Lifestyle  . Physical activity:    Days per week: 0 days    Minutes per session: 0 min  . Stress: Not at all  Relationships  . Social connections:    Talks on phone: More than three times a week    Gets together: More than three times a week    Attends religious service: More than 4 times per year    Active member of club or organization: Yes    Attends meetings of clubs or organizations: Never    Relationship status: Married  . Intimate partner violence:    Fear   of current or ex partner: No    Emotionally abused: No    Physically abused: No    Forced sexual activity: No  Other Topics Concern  . Not on file  Social History Narrative   Lives at home with her husband.   4 cups caffeine per day.   Right-handed.    FAMILY HISTORY:  Family History  Problem Relation Age of Onset  . Heart attack Mother   . Bronchitis Father   . Diabetes Sister   . Leukemia Brother   . Lung disease Brother   . Lung disease Sister     CURRENT MEDICATIONS:  Outpatient Encounter Medications as of 10/30/2018  Medication Sig  . acetaminophen (TYLENOL) 325 MG tablet Take 650 mg by mouth every 6 (six) hours as needed.  Marland Kitchen aspirin 325 MG tablet Take 162.5 mg by mouth daily.   . cetirizine (ZYRTEC) 10 MG tablet Take 10 mg by mouth daily.  Marland Kitchen dexamethasone (DECADRON) 4 MG tablet Take 1 tablet (4 mg total) by mouth 2 (two) times daily. (Patient not taking: Reported on 10/30/2018)  . diphenhydrAMINE (BENADRYL) 25 MG tablet Take 25 mg by mouth at bedtime as needed.  . hydrocortisone 1 % ointment  Apply 1 application topically 2 (two) times daily.  Marland Kitchen lidocaine-prilocaine (EMLA) cream Apply to port site one hour prior to appointment and cover with plastic wrap.  . losartan (COZAAR) 100 MG tablet Take 100 mg by mouth daily.   . Multiple Vitamin (MULTIVITAMIN WITH MINERALS) TABS tablet Take 1 tablet by mouth daily.  . Omega-3 Fatty Acids (FISH OIL PO) Take 2 capsules by mouth daily.   Marland Kitchen PACLitaxel (TAXOL IV) Inject into the vein once a week.  . pantoprazole (PROTONIX) 40 MG tablet Take 40 mg by mouth daily.  . prochlorperazine (COMPAZINE) 10 MG tablet Take 1 tablet (10 mg total) by mouth every 6 (six) hours as needed (Nausea or vomiting).  . simvastatin (ZOCOR) 10 MG tablet Take 10 mg by mouth daily.  . traMADol (ULTRAM) 50 MG tablet Take 1 tablet (50 mg total) by mouth every 6 (six) hours as needed.  . Trastuzumab (HERCEPTIN IV) Inject into the vein once a week.    No facility-administered encounter medications on file as of 10/30/2018.     ALLERGIES:  Allergies  Allergen Reactions  . Penicillins Rash    Has patient had a PCN reaction causing immediate rash, facial/tongue/throat swelling, SOB or lightheadedness with hypotension: No Has patient had a PCN reaction causing severe rash involving mucus membranes or skin necrosis: No Has patient had a PCN reaction that required hospitalization: No Has patient had a PCN reaction occurring within the last 10 years: No If all of the above answers are "NO", then may proceed with Cephalosporin use.      PHYSICAL EXAM:  ECOG Performance status: 1  VITAL SIGNS:BP:158/74, P:92, R:18, T:98.8, O2:99% WEIGHT: 183.6  Physical Exam Constitutional:      Appearance: Normal appearance. She is normal weight.  Cardiovascular:     Rate and Rhythm: Normal rate and regular rhythm.  Pulmonary:     Effort: Pulmonary effort is normal.     Breath sounds: Normal breath sounds.  Musculoskeletal: Normal range of motion.  Skin:    General: Skin is warm  and dry.  Neurological:     Mental Status: She is alert and oriented to person, place, and time. Mental status is at baseline.  Psychiatric:        Mood and Affect: Mood  normal.        Behavior: Behavior normal.        Thought Content: Thought content normal.        Judgment: Judgment normal.   Breast evaluation shows erythema in the upper quadrant of the right breast.  There is very mild erythema around the nipple area.  There is a vague mass in the retroareolar region of the right breast.  No palpable adenopathy in the axilla or supraclavicular region.  Right upper extremity lymphedema present.   LABORATORY DATA:  I have reviewed the labs as listed.  CBC    Component Value Date/Time   WBC 8.6 10/30/2018 0818   RBC 3.32 (L) 10/30/2018 0818   HGB 9.7 (L) 10/30/2018 0818   HGB 12.7 11/24/2017 1626   HCT 31.1 (L) 10/30/2018 0818   HCT 36.6 11/24/2017 1626   PLT 391 10/30/2018 0818   PLT 420 (H) 11/24/2017 1626   MCV 93.7 10/30/2018 0818   MCV 88 11/24/2017 1626   MCH 29.2 10/30/2018 0818   MCHC 31.2 10/30/2018 0818   RDW 16.9 (H) 10/30/2018 0818   RDW 15.4 11/24/2017 1626   LYMPHSABS 2.4 10/30/2018 0818   LYMPHSABS 2.5 11/24/2017 1626   MONOABS 0.8 10/30/2018 0818   EOSABS 0.2 10/30/2018 0818   EOSABS 0.1 11/24/2017 1626   BASOSABS 0.0 10/30/2018 0818   BASOSABS 0.0 11/24/2017 1626   CMP Latest Ref Rng & Units 10/30/2018 10/23/2018 10/15/2018  Glucose 70 - 99 mg/dL 106(H) 102(H) 102(H)  BUN 8 - 23 mg/dL 15 20 13  Creatinine 0.44 - 1.00 mg/dL 1.07(H) 1.06(H) 1.00  Sodium 135 - 145 mmol/L 134(L) 136 136  Potassium 3.5 - 5.1 mmol/L 4.0 4.5 4.2  Chloride 98 - 111 mmol/L 102 103 102  CO2 22 - 32 mmol/L 25 25 25  Calcium 8.9 - 10.3 mg/dL 8.8(L) 8.9 9.0  Total Protein 6.5 - 8.1 g/dL 6.4(L) 6.3(L) 6.6  Total Bilirubin 0.3 - 1.2 mg/dL 0.4 0.4 0.6  Alkaline Phos 38 - 126 U/L 76 65 75  AST 15 - 41 U/L 23 24 26  ALT 0 - 44 U/L 23 30 24       DIAGNOSTIC IMAGING:  I have  independently reviewed the scans and discussed with the patient.   I have reviewed  , NP's note and agree with the documentation.  I personally performed a face-to-face visit, made revisions and my assessment and plan is as follows.    ASSESSMENT & PLAN:   Malignant neoplasm of upper-outer quadrant of right breast in female, estrogen receptor negative (HCC) 1.  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 - Right breast biopsy at 11:30 position on 07/03/2018 shows invasive ductal carcinoma, ER/PR negative, HER-2 positive, Ki-67 of 40%, right axillary lymph node biopsy shows benign tissue, left breast core needle biopsy at 230 o'clock position shows complex sclerosing lesion - CT chest abdomen and pelvis on 07/16/2018 showed a 6 mm nodule in the posterior left lower lobe with no other evidence of metastatic disease. -Bone scan on 07/16/2018 shows focal area of increased activity over the   distal left clavicle/shoulder consistent with degenerative changes. - Skin punch biopsy of the right breast on 07/28/2018 was positive for dermal lymphatic invasion. -Echo on 07/27/2018 shows ejection fraction of 55 to 60%. -She was started on weekly paclitaxel and Herceptin on 07/31/2018. - She completed week 8 of Herceptin and week 7 of Taxol on 09/18/2018. - Week 9 Taxol was cut back to 45 mg/m on 10/02/2018.  She tolerated it very well. - She is tolerating paclitaxel very well except some fatigue.  Fatigue is usually lasting 2 to 3 days. -She denies any tingling or numbness in  the extremities. -She may proceed with week 12 of paclitaxel at the same dose. -Physical examination today revealed erythema occupying the upper quadrant of the right breast.  There is no lymphadenopathy palpable.  There is vague mass in the right breast behind the retroareolar area which is freely mobile. -There is right upper extremity swelling, likely from lymphedema.  We have done a Doppler which was negative for blood clot.   -I will refer her back to Dr. Wakefield to get his opinion. -I will see her back in 2 weeks for follow-up.  She will be initiated on Herceptin on a every 3 weekly regimen.  We will also obtain a 2D echocardiogram.  2.  Taxane induced skin rash: - She developed erythematous maculopapular rash on extremities, predominantly on shins after week 5 of paclitaxel.  - She is applying hydrocortisone cream twice daily.  She is also taking dexamethasone 4 mg twice daily for 3 days after each weekly paclitaxel.      Orders placed this encounter:  Orders Placed This Encounter  Procedures  . US Venous Img Upper Uni Right  . ECHOCARDIOGRAM COMPLETE      Sreedhar Katragadda, MD [facility] [phone]  

## 2018-11-03 ENCOUNTER — Other Ambulatory Visit (HOSPITAL_COMMUNITY): Payer: Self-pay | Admitting: *Deleted

## 2018-11-03 MED ORDER — MISC. DEVICES MISC: 0 refills | Status: DC

## 2018-11-06 ENCOUNTER — Ambulatory Visit (HOSPITAL_COMMUNITY): Payer: Medicare HMO

## 2018-11-06 ENCOUNTER — Other Ambulatory Visit (HOSPITAL_COMMUNITY): Payer: Medicare HMO

## 2018-11-06 ENCOUNTER — Telehealth (HOSPITAL_COMMUNITY): Payer: Self-pay | Admitting: Hematology

## 2018-11-13 ENCOUNTER — Inpatient Hospital Stay (HOSPITAL_COMMUNITY): Payer: Medicare HMO

## 2018-11-13 ENCOUNTER — Inpatient Hospital Stay (HOSPITAL_COMMUNITY): Payer: Medicare HMO | Admitting: Hematology

## 2018-11-13 ENCOUNTER — Other Ambulatory Visit: Payer: Self-pay

## 2018-11-13 ENCOUNTER — Encounter (HOSPITAL_COMMUNITY): Payer: Self-pay | Admitting: Hematology

## 2018-11-13 VITALS — BP 158/82 | HR 100 | Temp 98.2°F | Resp 18 | Wt 181.4 lb

## 2018-11-13 VITALS — BP 129/54 | HR 88 | Temp 98.0°F | Resp 18

## 2018-11-13 DIAGNOSIS — E785 Hyperlipidemia, unspecified: Secondary | ICD-10-CM

## 2018-11-13 DIAGNOSIS — C50411 Malignant neoplasm of upper-outer quadrant of right female breast: Secondary | ICD-10-CM

## 2018-11-13 DIAGNOSIS — K5730 Diverticulosis of large intestine without perforation or abscess without bleeding: Secondary | ICD-10-CM | POA: Diagnosis not present

## 2018-11-13 DIAGNOSIS — I10 Essential (primary) hypertension: Secondary | ICD-10-CM | POA: Diagnosis not present

## 2018-11-13 DIAGNOSIS — E041 Nontoxic single thyroid nodule: Secondary | ICD-10-CM | POA: Diagnosis not present

## 2018-11-13 DIAGNOSIS — Z8781 Personal history of (healed) traumatic fracture: Secondary | ICD-10-CM

## 2018-11-13 DIAGNOSIS — Z7982 Long term (current) use of aspirin: Secondary | ICD-10-CM

## 2018-11-13 DIAGNOSIS — Z171 Estrogen receptor negative status [ER-]: Principal | ICD-10-CM

## 2018-11-13 DIAGNOSIS — Z5112 Encounter for antineoplastic immunotherapy: Secondary | ICD-10-CM | POA: Diagnosis not present

## 2018-11-13 DIAGNOSIS — Z79899 Other long term (current) drug therapy: Secondary | ICD-10-CM

## 2018-11-13 DIAGNOSIS — Z9221 Personal history of antineoplastic chemotherapy: Secondary | ICD-10-CM

## 2018-11-13 DIAGNOSIS — R21 Rash and other nonspecific skin eruption: Secondary | ICD-10-CM | POA: Diagnosis not present

## 2018-11-13 DIAGNOSIS — K219 Gastro-esophageal reflux disease without esophagitis: Secondary | ICD-10-CM

## 2018-11-13 LAB — CBC WITH DIFFERENTIAL/PLATELET
Abs Immature Granulocytes: 0.44 10*3/uL — ABNORMAL HIGH (ref 0.00–0.07)
Basophils Absolute: 0.1 10*3/uL (ref 0.0–0.1)
Basophils Relative: 1 %
Eosinophils Absolute: 0.5 10*3/uL (ref 0.0–0.5)
Eosinophils Relative: 4 %
HCT: 30.5 % — ABNORMAL LOW (ref 36.0–46.0)
Hemoglobin: 9.7 g/dL — ABNORMAL LOW (ref 12.0–15.0)
Immature Granulocytes: 4 %
Lymphocytes Relative: 26 %
Lymphs Abs: 3 10*3/uL (ref 0.7–4.0)
MCH: 29.3 pg (ref 26.0–34.0)
MCHC: 31.8 g/dL (ref 30.0–36.0)
MCV: 92.1 fL (ref 80.0–100.0)
MONOS PCT: 10 %
Monocytes Absolute: 1.2 10*3/uL — ABNORMAL HIGH (ref 0.1–1.0)
Neutro Abs: 6.3 10*3/uL (ref 1.7–7.7)
Neutrophils Relative %: 55 %
Platelets: 517 10*3/uL — ABNORMAL HIGH (ref 150–400)
RBC: 3.31 MIL/uL — ABNORMAL LOW (ref 3.87–5.11)
RDW: 16 % — AB (ref 11.5–15.5)
WBC: 11.5 10*3/uL — ABNORMAL HIGH (ref 4.0–10.5)
nRBC: 0 % (ref 0.0–0.2)

## 2018-11-13 LAB — COMPREHENSIVE METABOLIC PANEL
ALK PHOS: 89 U/L (ref 38–126)
ALT: 24 U/L (ref 0–44)
ANION GAP: 11 (ref 5–15)
AST: 29 U/L (ref 15–41)
Albumin: 3.2 g/dL — ABNORMAL LOW (ref 3.5–5.0)
BUN: 12 mg/dL (ref 8–23)
CO2: 24 mmol/L (ref 22–32)
Calcium: 9.2 mg/dL (ref 8.9–10.3)
Chloride: 97 mmol/L — ABNORMAL LOW (ref 98–111)
Creatinine, Ser: 1.06 mg/dL — ABNORMAL HIGH (ref 0.44–1.00)
GFR calc Af Amer: 55 mL/min — ABNORMAL LOW (ref >60)
GFR calc non Af Amer: 48 mL/min — ABNORMAL LOW (ref >60)
Glucose, Bld: 113 mg/dL — ABNORMAL HIGH (ref 70–99)
Potassium: 4.1 mmol/L (ref 3.5–5.1)
SODIUM: 132 mmol/L — AB (ref 135–145)
Total Bilirubin: 0.7 mg/dL (ref 0.3–1.2)
Total Protein: 6.8 g/dL (ref 6.5–8.1)

## 2018-11-13 MED ORDER — DIPHENHYDRAMINE HCL 25 MG PO CAPS
50.0000 mg | ORAL_CAPSULE | Freq: Once | ORAL | Status: AC
  Administered 2018-11-13: 50 mg via ORAL

## 2018-11-13 MED ORDER — ACETAMINOPHEN 325 MG PO TABS
650.0000 mg | ORAL_TABLET | Freq: Once | ORAL | Status: AC
  Administered 2018-11-13: 650 mg via ORAL

## 2018-11-13 MED ORDER — TRASTUZUMAB CHEMO 150 MG IV SOLR
6.0000 mg/kg | Freq: Once | INTRAVENOUS | Status: AC
  Administered 2018-11-13: 504 mg via INTRAVENOUS
  Filled 2018-11-13: qty 24

## 2018-11-13 MED ORDER — HEPARIN SOD (PORK) LOCK FLUSH 100 UNIT/ML IV SOLN
500.0000 [IU] | Freq: Once | INTRAVENOUS | Status: AC | PRN
  Administered 2018-11-13: 500 [IU]

## 2018-11-13 MED ORDER — ACETAMINOPHEN 325 MG PO TABS
ORAL_TABLET | ORAL | Status: AC
  Filled 2018-11-13: qty 2

## 2018-11-13 MED ORDER — SODIUM CHLORIDE 0.9% FLUSH
10.0000 mL | INTRAVENOUS | Status: DC | PRN
  Administered 2018-11-13: 10 mL
  Filled 2018-11-13: qty 10

## 2018-11-13 MED ORDER — DIPHENHYDRAMINE HCL 25 MG PO CAPS
ORAL_CAPSULE | ORAL | Status: AC
  Filled 2018-11-13: qty 2

## 2018-11-13 MED ORDER — SODIUM CHLORIDE 0.9 % IV SOLN
Freq: Once | INTRAVENOUS | Status: AC
  Administered 2018-11-13: 12:00:00 via INTRAVENOUS

## 2018-11-13 NOTE — Progress Notes (Signed)
[address], [place] [address]   CLINIC:  Medical Oncology/Hematology  PCP:  [name], MD [place] [address] [phone]   REASON FOR VISIT: Follow-up for right breast cancer, ER-/PR-/HER2+  CURRENT THERAPY:Herceptin every 3 weeks.  BRIEF ONCOLOGIC HISTORY:    Malignant neoplasm of upper-outer quadrant of right breast in female, estrogen receptor negative ([facility])   07/03/2018 Initial Diagnosis    Palpable right breast mass for several months with skin discoloration, clinically skin trabecular thickening with nipple retraction measuring 7.2 x 4.3 x 4.2 cm, additional mass 4 o'clock position 2.6 cm, right axillary tail 1.2 cm right axillary lymph node 1.8 cm left breast irregular mass 1 cm, adjacent mass 0.9 cm together measuring 1.9 cm, no lymphadenopathy    07/03/2018 Pathology Results    Right breast biopsy: IDC grade 2, ER 0%, PR 0%, HER-2 positive, Ki-67 40%, lymph node biopsy negative, left breast biopsy complex sclerosing lesion     07/15/2018 Cancer Staging    Staging form: Breast, AJCC 8th Edition - Clinical: Stage IIB (cT3, cN0, cM0, G2, ER-, PR-, HER2+) - Signed by [name], MD on 07/15/2018    07/31/2018 - 10/30/2018 Chemotherapy    The patient had trastuzumab (HERCEPTIN) 336 mg in sodium chloride 0.9 % 250 mL chemo infusion, 4 mg/kg = 336 mg, Intravenous,  Once, 4 of 4 cycles Administration: 336 mg (07/31/2018), 168 mg (08/07/2018), 168 mg (08/28/2018), 168 mg (08/14/2018), 168 mg (08/21/2018), 168 mg (09/04/2018), 168 mg (09/11/2018), 168 mg (09/18/2018), 168 mg (10/02/2018), 168 mg (10/09/2018), 168 mg (10/16/2018), 168 mg (10/23/2018), 168 mg (10/30/2018) PACLitaxel (TAXOL) 78 mg in sodium chloride 0.9 % 250 mL chemo infusion (</= 64m/m2), 40 mg/m2 = 78 mg (50 % of original dose 80 mg/m2), Intravenous,  Once, 4 of 4 cycles Dose modification: 40 mg/m2 (50 % of original dose 80 mg/m2, Cycle 1, Reason: Patient Age),  53.3333 mg/m2 (66.7 % of original dose 80 mg/m2, Cycle 1, Reason: Provider Judgment), 45 mg/m2 (original dose 80 mg/m2, Cycle 3, Reason: Provider Judgment) Administration: 78 mg (07/31/2018), 102 mg (08/07/2018), 102 mg (08/28/2018), 102 mg (08/14/2018), 102 mg (08/21/2018), 102 mg (09/04/2018), 102 mg (09/18/2018), 84 mg (10/02/2018), 84 mg (10/09/2018), 84 mg (10/16/2018), 84 mg (10/23/2018), 84 mg (10/30/2018)  for chemotherapy treatment.     11/13/2018 -  Chemotherapy    The patient had trastuzumab (HERCEPTIN) 504 mg in sodium chloride 0.9 % 250 mL chemo infusion, 6 mg/kg = 504 mg (100 % of original dose 6 mg/kg), Intravenous,  Once, 1 of 5 cycles Dose modification: 6 mg/kg (original dose 6 mg/kg, Cycle 1, Reason: Other (see comments), Comment: pt receving it )  for chemotherapy treatment.       CANCER STAGING: Cancer Staging Malignant neoplasm of upper-outer quadrant of right breast in female, estrogen receptor negative ([facility] Staging form: Breast, AJCC 8th Edition - Clinical: Stage IIB (cT3, cN0, cM0, G2, ER-, PR-, HER2+) - Signed by GNicholas Lose MD on 07/15/2018    INTERVAL HISTORY:  Ms. [id number]. female returns for routine follow-up for right breast cancer. She is here today with her husband. Her redness of her breast has decreased and she now has a sleeve on her right arm that helps with the fluid. Denies any nausea, vomiting, or diarrhea. Denies any new pains. Had not noticed any recent bleeding such as epistaxis, hematuria or hematochezia. Denies recent chest pain on exertion, shortness of breath on minimal  exertion, pre-syncopal episodes, or palpitations. Denies any numbness or tingling in hands or feet. Denies any recent fevers, infections, or recent hospitalizations. Patient reports appetite at 100% and energy level at 50%.     REVIEW OF SYSTEMS:  Review of Systems  Constitutional: Positive for fatigue.  Respiratory: Positive for cough.   All other systems reviewed and are  negative.    PAST MEDICAL/SURGICAL HISTORY:  Past Medical History:  Diagnosis Date  . Cancer ([facility]) 06/2018   right breast cancer  . GERD (gastroesophageal reflux disease)    occasional   . Hyperlipidemia   . Hypertension    clearance with note Dr Nevada Crane on chart  . Pneumonia    2 years ago/ states occ cough with sinus drainage- no fever  . Thyroid nodule    with biopsy- states following medically   Past Surgical History:  Procedure Laterality Date  . CATARACT EXTRACTION W/PHACO  11/30/2012   Procedure: CATARACT EXTRACTION PHACO AND INTRAOCULAR LENS PLACEMENT (IOC);  Surgeon: Williams Che, MD;  Location: [facility];  Service: Ophthalmology;  Laterality: Left;  CDE:11.49  . CATARACT EXTRACTION W/PHACO Right 03/15/2013   Procedure: CATARACT EXTRACTION PHACO AND INTRAOCULAR LENS PLACEMENT (IOC);  Surgeon: Williams Che, MD;  Location: [facility];  Service: Ophthalmology;  Laterality: Right;  CDE 16.15  . COLONOSCOPY    . EXCISION OF SKIN TAG Right 06/17/2016   Procedure: SHAVE OF SKIN TAG RIGHT BUTTOCK;  Surgeon: Aviva Signs, MD;  Location: [facility];  Service: General;  Laterality: Right;  . JOINT REPLACEMENT     left knee  . MASS EXCISION Left 06/17/2016   Procedure: EXCISION SKIN MALIGNANT LESION LEFT BUTTOCK;  Surgeon: Aviva Signs, MD;  Location: [facility];  Service: General;  Laterality: Left;  . PORTACATH PLACEMENT Right 07/28/2018   Procedure: INSERTION PORT-A-CATH WITH ULTRASOUND;  Surgeon: Rolm Bookbinder, MD;  Location: [facility];  Service: General;  Laterality: Right;  . PUNCH BIOPSY OF SKIN Right 07/28/2018   Procedure: PUNCH BIOPSY OF SKIN RIGHT BREAST;  Surgeon: Rolm Bookbinder, MD;  Location: [facility];  Service: General;  Laterality: Right;  . TOTAL KNEE ARTHROPLASTY  12/16/2011   Procedure: TOTAL KNEE ARTHROPLASTY;  Surgeon: Gearlean Alf, MD;  Location: [facility];  Service: Orthopedics;  Laterality: Right;  . TUBAL LIGATION       SOCIAL  HISTORY:  Social History   Socioeconomic History  . Marital status: Married    Spouse name: Not on file  . Number of children: 2  . Years of education: some business school after HS  . Highest education level: Not on file  Occupational History  . Occupation: Cabin crew  . Occupation: Network engineer   Social Needs  . Financial resource strain: Not hard at all  . Food insecurity:    Worry: Never true    Inability: Never true  . Transportation needs:    Medical: No    Non-medical: No  Tobacco Use  . Smoking status: Never Smoker  . Smokeless tobacco: Never Used  Substance and Sexual Activity  . Alcohol use: No    Frequency: Never  . Drug use: No  . Sexual activity: Yes    Birth control/protection: None  Lifestyle  . Physical activity:    Days per week: 0 days    Minutes per session: 0 min  . Stress: Not at all  Relationships  . Social connections:    Talks on phone: More than three times a week  Gets together: More than three times a week    Attends religious service: More than 4 times per year    Active member of club or organization: Yes    Attends meetings of clubs or organizations: Never    Relationship status: Married  . Intimate partner violence:    Fear of current or ex partner: No    Emotionally abused: No    Physically abused: No    Forced sexual activity: No  Other Topics Concern  . Not on file  Social History Narrative   Lives at home with her husband.   4 cups caffeine per day.   Right-handed.    FAMILY HISTORY:  Family History  Problem Relation Age of Onset  . Heart attack Mother   . Bronchitis Father   . Diabetes Sister   . Leukemia Brother   . Lung disease Brother   . Lung disease Sister     CURRENT MEDICATIONS:  Outpatient Encounter Medications as of 11/13/2018  Medication Sig  . acetaminophen (TYLENOL) 325 MG tablet Take 650 mg by mouth every 6 (six) hours as needed.  Marland Kitchen aspirin 325 MG tablet Take 162.5 mg by mouth daily.   . cetirizine  (ZYRTEC) 10 MG tablet Take 10 mg by mouth daily.  . hydrocortisone 1 % ointment Apply 1 application topically 2 (two) times daily.  Marland Kitchen lidocaine-prilocaine (EMLA) cream Apply to port site one hour prior to appointment and cover with plastic wrap.  . Misc. Devices MISC Please provide lymphedema sleeve for right arm  . Multiple Vitamin (MULTIVITAMIN WITH MINERALS) TABS tablet Take 1 tablet by mouth daily.  . Omega-3 Fatty Acids (FISH OIL PO) Take 2 capsules by mouth daily.   Marland Kitchen PACLitaxel (TAXOL IV) Inject into the vein once a week.  . pantoprazole (PROTONIX) 40 MG tablet Take 40 mg by mouth daily.  . simvastatin (ZOCOR) 10 MG tablet Take 10 mg by mouth daily.  . Trastuzumab (HERCEPTIN IV) Inject into the vein once a week.   . diphenhydrAMINE (BENADRYL) 25 MG tablet Take 25 mg by mouth at bedtime as needed.  . prochlorperazine (COMPAZINE) 10 MG tablet Take 1 tablet (10 mg total) by mouth every 6 (six) hours as needed (Nausea or vomiting). (Patient not taking: Reported on 11/13/2018)  . UNABLE TO FIND Take 1 capsule by mouth daily. Med Name: Tumeric Curcumin  . [DISCONTINUED] dexamethasone (DECADRON) 4 MG tablet Take 1 tablet (4 mg total) by mouth 2 (two) times daily. (Patient not taking: Reported on 10/30/2018)  . [DISCONTINUED] losartan (COZAAR) 100 MG tablet Take 100 mg by mouth daily.   . [DISCONTINUED] traMADol (ULTRAM) 50 MG tablet Take 1 tablet (50 mg total) by mouth every 6 (six) hours as needed.   No facility-administered encounter medications on file as of 11/13/2018.     ALLERGIES:  Allergies  Allergen Reactions  . Penicillins Rash    Has patient had a PCN reaction causing immediate rash, facial/tongue/throat swelling, SOB or lightheadedness with hypotension: No Has patient had a PCN reaction causing severe rash involving mucus membranes or skin necrosis: No Has patient had a PCN reaction that required hospitalization: No Has patient had a PCN reaction occurring within the last 10 years:  No If all of the above answers are "NO", then may proceed with Cephalosporin use.      PHYSICAL EXAM:  ECOG Performance status: 1  Vitals:   11/13/18 0900  BP: (!) 158/82  Pulse: 100  Resp: 18  Temp: 98.2 F (36.8  C)  SpO2: 100%   Filed Weights   11/13/18 0900  Weight: 181 lb 6 oz (82.3 kg)    Physical Exam Constitutional:      Appearance: Normal appearance. She is normal weight.  Cardiovascular:     Rate and Rhythm: Normal rate and regular rhythm.     Heart sounds: Normal heart sounds.  Pulmonary:     Effort: Pulmonary effort is normal.     Breath sounds: Normal breath sounds.  Musculoskeletal: Normal range of motion.  Skin:    General: Skin is warm and dry.  Neurological:     Mental Status: She is alert and oriented to person, place, and time. Mental status is at baseline.  Psychiatric:        Mood and Affect: Mood normal.        Behavior: Behavior normal.        Thought Content: Thought content normal.        Judgment: Judgment normal.      LABORATORY DATA:  I have reviewed the labs as listed.  CBC    Component Value Date/Time   WBC 11.5 (H) 11/13/2018 0917   RBC 3.31 (L) 11/13/2018 0917   HGB 9.7 (L) 11/13/2018 0917   HGB 12.7 11/24/2017 1626   HCT 30.5 (L) 11/13/2018 0917   HCT 36.6 11/24/2017 1626   PLT 517 (H) 11/13/2018 0917   PLT 420 (H) 11/24/2017 1626   MCV 92.1 11/13/2018 0917   MCV 88 11/24/2017 1626   MCH 29.3 11/13/2018 0917   MCHC 31.8 11/13/2018 0917   RDW 16.0 (H) 11/13/2018 0917   RDW 15.4 11/24/2017 1626   LYMPHSABS 3.0 11/13/2018 0917   LYMPHSABS 2.5 11/24/2017 1626   MONOABS 1.2 (H) 11/13/2018 0917   EOSABS 0.5 11/13/2018 0917   EOSABS 0.1 11/24/2017 1626   BASOSABS 0.1 11/13/2018 0917   BASOSABS 0.0 11/24/2017 1626   CMP Latest Ref Rng & Units 11/13/2018 10/30/2018 10/23/2018  Glucose 70 - 99 mg/dL 113(H) 106(H) 102(H)  BUN 8 - 23 mg/dL _0 Creatinine 0.44 - 1.00 mg/dL 1.06(H) 1.07(H) 1.06(H)  Sodium 135 - 145  mmol/L 132(L) 134(L) 136  Potassium 3.5 - 5.1 mmol/L 4.1 4.0 4.5  Chloride 98 - 111 mmol/L 97(L) 102 103  CO2 22 - 32 mmol/L _1 Calcium 8.9 - 10.3 mg/dL 9.2 8.8(L) 8.9  Total Protein 6.5 - 8.1 g/dL 6.8 6.4(L) 6.3(L)  Total Bilirubin 0.3 - 1.2 mg/dL 0.7 0.4 0.4  Alkaline Phos 38 - 126 U/L 89 76 65  AST 15 - 41 U/L _2 ALT 0 - 44 U/L _3 DIAGNOSTIC IMAGING:  I have independently reviewed the scans and discussed with the patient.   I have reviewed Francene Finders, NP's note and agree with the documentation.  I personally performed a face-to-face visit, made revisions and my assessment and plan is as follows.    ASSESSMENT & PLAN:   Malignant neoplasm of upper-outer quadrant of right breast in female, estrogen receptor negative ([facility]) 1.  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 - Right breast biopsy at 11:30 position on 07/03/2018 shows invasive ductal carcinoma, ER/PR negative, HER-2 positive, Ki-67 of 40%, right axillary lymph node biopsy shows benign tissue, left breast core needle biopsy at 230 o'clock position shows complex sclerosing lesion - CT chest abdomen and pelvis on 07/16/2018 showed a 6 mm nodule in the posterior left lower lobe with no other evidence of metastatic disease. -Bone scan on 07/16/2018 shows focal area of increased activity over the distal left clavicle/shoulder consistent with degenerative changes. - Skin punch biopsy of the right breast on 07/28/2018 was positive for dermal lymphatic invasion. -Echo on 07/27/2018  shows ejection fraction of 55 to 60%. -She was started on weekly paclitaxel and Herceptin on 07/31/2018. - She completed week 8 of Herceptin and week 7 of Taxol on 09/18/2018. - Week 9 Taxol was cut back to 45 mg/m on 10/02/2018.  She tolerated it very well. - Week 12 of paclitaxel was completed on 10/30/2018.  Energy levels are improving. -Physical examination today revealed erythema which is stable occupying the upper outer quadrant of the right breast.  No lymphadenopathy palpable.  There is mild erythema in the right axillary region.  This is also stable.  Vague mass in the right breast behind the retroareolar area, which is freely mobile. -We will set up a PET CT scan prior to next visit.  Today she may proceed with Herceptin 6 mg/kg every 3-week dose. -We will obtain echocardiogram.  I will see her after the PET scan in 3 weeks.  2.  Taxane induced skin rash: - She developed erythematous maculopapular rash on extremities, predominantly on shins after week 5 of paclitaxel.  - This is improving since she completed paclitaxel 2 weeks ago.      Orders placed this encounter:  Orders Placed This Encounter  Procedures  . NM PET Image Restag (PS) Skull Base To Thigh  . CBC with Differential/Platelet  . Comprehensive metabolic panel      Derek Jack, MD [place] [phone]

## 2018-11-13 NOTE — Assessment & Plan Note (Signed)
1.  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 - Right breast biopsy at 11:30 position on 07/03/2018 shows invasive ductal carcinoma, ER/PR negative, HER-2 positive, Ki-67 of 40%, right axillary lymph node biopsy shows benign tissue, left breast core needle biopsy at 230 o'clock position shows complex sclerosing lesion - CT chest abdomen and pelvis on 07/16/2018 showed a 6 mm nodule in the posterior left lower lobe with no other evidence of metastatic disease. -Bone scan on 07/16/2018 shows focal area of increased activity over the distal left clavicle/shoulder consistent with degenerative changes. - Skin punch biopsy of the right breast on 07/28/2018 was positive for dermal lymphatic invasion. -Echo on 07/27/2018 shows ejection fraction of 55 to 60%. -She was started on weekly paclitaxel and Herceptin on 07/31/2018. - She completed week 8 of Herceptin and week 7 of Taxol on 09/18/2018. - Week 9 Taxol was cut back to 45 mg/m on 10/02/2018.  She tolerated it very well. - Week 12 of paclitaxel was completed on 10/30/2018.  Energy levels are improving. -Physical examination today revealed erythema which is stable occupying the upper outer quadrant of the right breast.  No lymphadenopathy palpable.  There is mild erythema in the right axillary region.  This is also stable.  Vague mass in the right breast behind the retroareolar area, which is freely mobile. -We will set up a PET CT scan prior to next visit.  Today she may proceed with Herceptin 6 mg/kg every 3-week dose. -We will obtain  echocardiogram.  I will see her after the PET scan in 3 weeks.  2.  Taxane induced skin rash: - She developed erythematous maculopapular rash on extremities, predominantly on shins after week 5 of paclitaxel.  - This is improving since she completed paclitaxel 2 weeks ago. 

## 2018-11-13 NOTE — Patient Instructions (Signed)
Cancer Center Discharge Instructions for Patients Receiving Chemotherapy  Today you received the following chemotherapy agents  If you develop nausea and vomiting that is not controlled by your nausea medication, call the clinic.   BELOW ARE SYMPTOMS THAT SHOULD BE REPORTED IMMEDIATELY:  *FEVER GREATER THAN 100.5 F  *CHILLS WITH OR WITHOUT FEVER  NAUSEA AND VOMITING THAT IS NOT CONTROLLED WITH YOUR NAUSEA MEDICATION  *UNUSUAL SHORTNESS OF BREATH  *UNUSUAL BRUISING OR BLEEDING  TENDERNESS IN MOUTH AND THROAT WITH OR WITHOUT PRESENCE OF ULCERS  *URINARY PROBLEMS  *BOWEL PROBLEMS  UNUSUAL RASH Items with * indicate a potential emergency and should be followed up as soon as possible.  Feel free to call the clinic should you have any questions or concerns. The clinic phone number is [phone].  Please show the CHEMO ALERT CARD at check-in to the Emergency Department and triage nurse.   

## 2018-11-13 NOTE — Patient Instructions (Addendum)
[facility] at [facility] Discharge Instructions Follow up in 3 weeks with labs and treatment.    Thank you for choosing [facility] at [facility] to provide your oncology and hematology care.  To afford each patient quality time with our provider, please arrive at least 15 minutes before your scheduled appointment time.   If you have a lab appointment with the Cancer Center please come in thru the  Main Entrance and check in at the main information desk  You need to re-schedule your appointment should you arrive 10 or more minutes late.  We strive to give you quality time with our providers, and arriving late affects you and other patients whose appointments are after yours.  Also, if you no show three or more times for appointments you may be dismissed from the clinic at the providers discretion.     Again, thank you for choosing [facility].  Our hope is that these requests will decrease the amount of time that you wait before being seen by our physicians.       _____________________________________________________________  Should you have questions after your visit to  Cancer Center, please contact our office at [phone] between the hours of 8:00 a.m. and 4:30 p.m.  Voicemails left after 4:00 p.m. will not be returned until the following business day.  For prescription refill requests, have your pharmacy contact our office and allow 72 hours.    Cancer Center Support Programs:   > Cancer Support Group  2nd Tuesday of the month 1pm-2pm, Journey Room    

## 2018-11-13 NOTE — Progress Notes (Signed)
Reviewed progress note by NP and lab results.  Patient ok'd for treatment per MD.   Patient tolerated chemotherapy with no complaints voiced.  Port site clean and dry with no bruising or swelling noted at site.  Good blood return noted before and after administration of chemotherapy.  Band aid applied.  Patient left ambulatory with VSS and no s/s of distress noted.

## 2018-11-13 NOTE — Progress Notes (Signed)
Patient in room 413 is ready for treatment. He will put in plan and sign it in a min.

## 2018-11-16 ENCOUNTER — Ambulatory Visit (HOSPITAL_COMMUNITY)
Admission: RE | Admit: 2018-11-16 | Discharge: 2018-11-16 | Disposition: A | Discharge status: Home / Self Care | Payer: Medicare HMO | Source: Ambulatory Visit | Attending: Nurse Practitioner | Admitting: Nurse Practitioner

## 2018-11-16 DIAGNOSIS — G629 Polyneuropathy, unspecified: Secondary | ICD-10-CM | POA: Diagnosis not present

## 2018-11-16 DIAGNOSIS — I119 Hypertensive heart disease without heart failure: Secondary | ICD-10-CM | POA: Insufficient documentation

## 2018-11-16 DIAGNOSIS — I082 Rheumatic disorders of both aortic and tricuspid valves: Secondary | ICD-10-CM | POA: Insufficient documentation

## 2018-11-16 DIAGNOSIS — Z171 Estrogen receptor negative status [ER-]: Secondary | ICD-10-CM | POA: Insufficient documentation

## 2018-11-16 DIAGNOSIS — E785 Hyperlipidemia, unspecified: Secondary | ICD-10-CM | POA: Diagnosis not present

## 2018-11-16 DIAGNOSIS — C50411 Malignant neoplasm of upper-outer quadrant of right female breast: Secondary | ICD-10-CM | POA: Insufficient documentation

## 2018-11-16 NOTE — Progress Notes (Signed)
*  PRELIMINARY RESULTS* Echocardiogram 2D Echocardiogram has been performed.  Leavy Cella 11/16/2018, 10:32 AM

## 2018-11-17 ENCOUNTER — Telehealth: Payer: Self-pay | Admitting: General Practice

## 2018-11-17 NOTE — Telephone Encounter (Signed)
[facility] CSW Progress Notes  Call to patient to introduce support services, and provide information on upcoming cooking class and support group.  Message left on home answering machine, encouraged participation in both events.  Edwyna Shell, LCSW Clinical Social Worker Phone:  [phone]

## 2018-11-20 ENCOUNTER — Ambulatory Visit (HOSPITAL_COMMUNITY): Payer: Medicare HMO

## 2018-11-20 ENCOUNTER — Other Ambulatory Visit (HOSPITAL_COMMUNITY): Payer: Medicare HMO

## 2018-11-26 DIAGNOSIS — C50919 Malignant neoplasm of unspecified site of unspecified female breast: Secondary | ICD-10-CM | POA: Diagnosis not present

## 2018-11-30 ENCOUNTER — Ambulatory Visit (HOSPITAL_COMMUNITY)
Admission: RE | Admit: 2018-11-30 | Discharge: 2018-11-30 | Disposition: A | Discharge status: Home / Self Care | Payer: Medicare HMO | Source: Ambulatory Visit | Attending: Nurse Practitioner | Admitting: Nurse Practitioner

## 2018-11-30 DIAGNOSIS — C50411 Malignant neoplasm of upper-outer quadrant of right female breast: Secondary | ICD-10-CM | POA: Diagnosis not present

## 2018-11-30 DIAGNOSIS — C50911 Malignant neoplasm of unspecified site of right female breast: Secondary | ICD-10-CM | POA: Diagnosis not present

## 2018-11-30 DIAGNOSIS — Z171 Estrogen receptor negative status [ER-]: Secondary | ICD-10-CM | POA: Insufficient documentation

## 2018-11-30 MED ORDER — FLUDEOXYGLUCOSE F - 18 (FDG) INJECTION
11.4000 | Freq: Once | INTRAVENOUS | Status: AC | PRN
  Administered 2018-11-30: 11.4 via INTRAVENOUS

## 2018-12-04 ENCOUNTER — Other Ambulatory Visit: Payer: Self-pay

## 2018-12-04 ENCOUNTER — Encounter (HOSPITAL_COMMUNITY): Payer: Self-pay | Admitting: Hematology

## 2018-12-04 ENCOUNTER — Inpatient Hospital Stay (HOSPITAL_COMMUNITY): Payer: Medicare HMO | Attending: Hematology

## 2018-12-04 ENCOUNTER — Encounter (HOSPITAL_COMMUNITY): Payer: Self-pay

## 2018-12-04 ENCOUNTER — Inpatient Hospital Stay (HOSPITAL_COMMUNITY): Payer: Medicare HMO

## 2018-12-04 ENCOUNTER — Inpatient Hospital Stay (HOSPITAL_COMMUNITY): Payer: Medicare HMO | Admitting: Hematology

## 2018-12-04 VITALS — BP 150/67 | HR 78 | Temp 97.5°F | Resp 18

## 2018-12-04 VITALS — BP 148/79 | HR 100 | Temp 98.2°F | Resp 18 | Wt 178.5 lb

## 2018-12-04 DIAGNOSIS — R918 Other nonspecific abnormal finding of lung field: Secondary | ICD-10-CM

## 2018-12-04 DIAGNOSIS — I10 Essential (primary) hypertension: Secondary | ICD-10-CM | POA: Diagnosis not present

## 2018-12-04 DIAGNOSIS — Z8781 Personal history of (healed) traumatic fracture: Secondary | ICD-10-CM

## 2018-12-04 DIAGNOSIS — R21 Rash and other nonspecific skin eruption: Secondary | ICD-10-CM | POA: Insufficient documentation

## 2018-12-04 DIAGNOSIS — Z9221 Personal history of antineoplastic chemotherapy: Secondary | ICD-10-CM | POA: Insufficient documentation

## 2018-12-04 DIAGNOSIS — E041 Nontoxic single thyroid nodule: Secondary | ICD-10-CM | POA: Diagnosis not present

## 2018-12-04 DIAGNOSIS — Z5112 Encounter for antineoplastic immunotherapy: Secondary | ICD-10-CM

## 2018-12-04 DIAGNOSIS — C50411 Malignant neoplasm of upper-outer quadrant of right female breast: Secondary | ICD-10-CM

## 2018-12-04 DIAGNOSIS — K5730 Diverticulosis of large intestine without perforation or abscess without bleeding: Secondary | ICD-10-CM

## 2018-12-04 DIAGNOSIS — Z171 Estrogen receptor negative status [ER-]: Secondary | ICD-10-CM | POA: Insufficient documentation

## 2018-12-04 DIAGNOSIS — E785 Hyperlipidemia, unspecified: Secondary | ICD-10-CM | POA: Diagnosis not present

## 2018-12-04 DIAGNOSIS — K219 Gastro-esophageal reflux disease without esophagitis: Secondary | ICD-10-CM | POA: Insufficient documentation

## 2018-12-04 DIAGNOSIS — Z7982 Long term (current) use of aspirin: Secondary | ICD-10-CM | POA: Diagnosis not present

## 2018-12-04 DIAGNOSIS — Z79899 Other long term (current) drug therapy: Secondary | ICD-10-CM

## 2018-12-04 LAB — CBC WITH DIFFERENTIAL/PLATELET
Abs Immature Granulocytes: 0.03 10*3/uL (ref 0.00–0.07)
Basophils Absolute: 0.1 10*3/uL (ref 0.0–0.1)
Basophils Relative: 1 %
Eosinophils Absolute: 0.5 10*3/uL (ref 0.0–0.5)
Eosinophils Relative: 5 %
HCT: 33.8 % — ABNORMAL LOW (ref 36.0–46.0)
Hemoglobin: 10.6 g/dL — ABNORMAL LOW (ref 12.0–15.0)
Immature Granulocytes: 0 %
Lymphocytes Relative: 38 %
Lymphs Abs: 3.6 10*3/uL (ref 0.7–4.0)
MCH: 28.2 pg (ref 26.0–34.0)
MCHC: 31.4 g/dL (ref 30.0–36.0)
MCV: 89.9 fL (ref 80.0–100.0)
Monocytes Absolute: 0.8 10*3/uL (ref 0.1–1.0)
Monocytes Relative: 9 %
Neutro Abs: 4.5 10*3/uL (ref 1.7–7.7)
Neutrophils Relative %: 47 %
Platelets: 434 10*3/uL — ABNORMAL HIGH (ref 150–400)
RBC: 3.76 MIL/uL — AB (ref 3.87–5.11)
RDW: 15.6 % — ABNORMAL HIGH (ref 11.5–15.5)
WBC: 9.4 10*3/uL (ref 4.0–10.5)
nRBC: 0 % (ref 0.0–0.2)

## 2018-12-04 LAB — COMPREHENSIVE METABOLIC PANEL
ALT: 20 U/L (ref 0–44)
ANION GAP: 10 (ref 5–15)
AST: 27 U/L (ref 15–41)
Albumin: 3.4 g/dL — ABNORMAL LOW (ref 3.5–5.0)
Alkaline Phosphatase: 87 U/L (ref 38–126)
BUN: 13 mg/dL (ref 8–23)
CO2: 24 mmol/L (ref 22–32)
Calcium: 9 mg/dL (ref 8.9–10.3)
Chloride: 98 mmol/L (ref 98–111)
Creatinine, Ser: 1.05 mg/dL — ABNORMAL HIGH (ref 0.44–1.00)
GFR calc Af Amer: 56 mL/min — ABNORMAL LOW (ref >60)
GFR calc non Af Amer: 48 mL/min — ABNORMAL LOW (ref >60)
GLUCOSE: 116 mg/dL — AB (ref 70–99)
Potassium: 3.9 mmol/L (ref 3.5–5.1)
Sodium: 132 mmol/L — ABNORMAL LOW (ref 135–145)
Total Bilirubin: 0.3 mg/dL (ref 0.3–1.2)
Total Protein: 6.8 g/dL (ref 6.5–8.1)

## 2018-12-04 MED ORDER — SODIUM CHLORIDE 0.9% FLUSH
10.0000 mL | INTRAVENOUS | Status: DC | PRN
  Administered 2018-12-04: 10 mL
  Filled 2018-12-04: qty 10

## 2018-12-04 MED ORDER — ACETAMINOPHEN 325 MG PO TABS
650.0000 mg | ORAL_TABLET | Freq: Once | ORAL | Status: AC
  Administered 2018-12-04: 650 mg via ORAL
  Filled 2018-12-04: qty 2

## 2018-12-04 MED ORDER — DIPHENHYDRAMINE HCL 25 MG PO CAPS
25.0000 mg | ORAL_CAPSULE | Freq: Once | ORAL | Status: AC
  Administered 2018-12-04: 25 mg via ORAL
  Filled 2018-12-04: qty 1

## 2018-12-04 MED ORDER — SODIUM CHLORIDE 0.9 % IV SOLN
Freq: Once | INTRAVENOUS | Status: AC
  Administered 2018-12-04: 10:00:00 via INTRAVENOUS

## 2018-12-04 MED ORDER — TRASTUZUMAB CHEMO 150 MG IV SOLR
6.0000 mg/kg | Freq: Once | INTRAVENOUS | Status: AC
  Administered 2018-12-04: 504 mg via INTRAVENOUS
  Filled 2018-12-04: qty 24

## 2018-12-04 MED ORDER — HEPARIN SOD (PORK) LOCK FLUSH 100 UNIT/ML IV SOLN
500.0000 [IU] | Freq: Once | INTRAVENOUS | Status: AC | PRN
  Administered 2018-12-04: 500 [IU]
  Filled 2018-12-04: qty 5

## 2018-12-04 NOTE — Assessment & Plan Note (Addendum)
1.  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 - Right breast biopsy at 11:30 position on 07/03/2018 shows invasive ductal carcinoma, ER/PR negative, HER-2 positive, Ki-67 of 40%, right axillary lymph node biopsy shows benign tissue, left breast core needle biopsy at 230 o'clock position shows complex sclerosing lesion - CT chest abdomen and pelvis on 07/16/2018 showed a 6 mm nodule in the posterior left lower lobe with no other evidence of metastatic disease. -Bone scan on 07/16/2018 shows focal area of increased activity over the distal left clavicle/shoulder consistent with degenerative changes. - Skin punch biopsy of the right breast on 07/28/2018 was positive for dermal lymphatic invasion. -Echo on 07/27/2018 shows ejection fraction of 55 to 60%. -She was started on weekly paclitaxel and Herceptin on 07/31/2018. - She completed week 8 of Herceptin and week 7 of Taxol on 09/18/2018. - Week 9 Taxol was cut back to 45 mg/m on 10/02/2018.  She tolerated it very well. - Week 12 of paclitaxel completed on 10/30/2018. -Physical exam today revealed erythema in the upper outer quadrant of the right breast which is stable.  No lymphadenopathy palpable.  There is mild erythema in the right axillary region.  This is also stable.  Vague mass in the right breast behind the retroareolar area is freely mobile.   - PET CT scan on 11/30/2018 shows small to medium sized focus of increased uptake within the right breast compatible with residual hypermetabolic tumor.  No specific findings identified to  suggest FDG avid metastatic disease.  On the nondiagnostic CT portion of the exam there are several nodules identified in both lungs which are too small to reliably characterized by PET/CT.  These were not confidently identified on CT with contrast on 07/16/2018.  CT of the chest with contrast to confirm its presence was recommended.  Extensive sigmoid diverticulosis.  Multiple foci of intense uptake identified within the sigmoid colon. -2D echo on 11/16/2018 shows LVEF of 60 to 65%. -We will order CT of the chest with contrast to look into lung nodules.  He may proceed with her Herceptin today. - We will see her back in 3 weeks for follow-up.  She was apparently seen by Dr. Wakefield and has a follow-up visit in April.  2.  Taxane induced skin rash: - She developed erythematous maculopapular rash on extremities, predominantly on shins after week 5 of paclitaxel.  - This is improving since she completed paclitaxel 2 weeks ago. 

## 2018-12-04 NOTE — Progress Notes (Signed)
[place], [place] [address]   CLINIC:  Medical Oncology/Hematology  PCP:  [name], MD [place] [address] [phone]   REASON FOR VISIT: Follow-up for right breast cancer, ER-/PR-/HER2+  CURRENT THERAPY:Herceptin every 3 weeks.  BRIEF ONCOLOGIC HISTORY:    Malignant neoplasm of upper-outer quadrant of right breast in female, estrogen receptor negative ([place] [facility])   07/03/2018 Initial Diagnosis    Palpable right breast mass for several months with skin discoloration, clinically skin trabecular thickening with nipple retraction measuring 7.2 x 4.3 x 4.2 cm, additional mass 4 o'clock position 2.6 cm, right axillary tail 1.2 cm right axillary lymph node 1.8 cm left breast irregular mass 1 cm, adjacent mass 0.9 cm together measuring 1.9 cm, no lymphadenopathy    07/03/2018 Pathology Results    Right breast biopsy: IDC grade 2, ER 0%, PR 0%, HER-2 positive, Ki-67 40%, lymph node biopsy negative, left breast biopsy complex sclerosing lesion     07/15/2018 Cancer Staging    Staging form: Breast, AJCC 8th Edition - Clinical: Stage IIB (cT3, cN0, cM0, G2, ER-, PR-, HER2+) - Signed by [name], MD on 07/15/2018    07/31/2018 - 10/30/2018 Chemotherapy    The patient had trastuzumab (HERCEPTIN) 336 mg in sodium chloride 0.9 % 250 mL chemo infusion, 4 mg/kg = 336 mg, Intravenous,  Once, 4 of 4 cycles Administration: 336 mg (07/31/2018), 168 mg (08/07/2018), 168 mg (08/28/2018), 168 mg (08/14/2018), 168 mg (08/21/2018), 168 mg (09/04/2018), 168 mg (09/11/2018), 168 mg (09/18/2018), 168 mg (10/02/2018), 168 mg (10/09/2018), 168 mg (10/16/2018), 168 mg (10/23/2018), 168 mg (10/30/2018) PACLitaxel (TAXOL) 78 mg in sodium chloride 0.9 % 250 mL chemo infusion (</= 85m/m2), 40 mg/m2 = 78 mg (50 % of original dose 80 mg/m2), Intravenous,  Once, 4 of 4 cycles Dose modification: 40 mg/m2 (50 % of original dose 80 mg/m2, Cycle 1, Reason: Patient Age),  53.3333 mg/m2 (66.7 % of original dose 80 mg/m2, Cycle 1, Reason: Provider Judgment), 45 mg/m2 (original dose 80 mg/m2, Cycle 3, Reason: Provider Judgment) Administration: 78 mg (07/31/2018), 102 mg (08/07/2018), 102 mg (08/28/2018), 102 mg (08/14/2018), 102 mg (08/21/2018), 102 mg (09/04/2018), 102 mg (09/18/2018), 84 mg (10/02/2018), 84 mg (10/09/2018), 84 mg (10/16/2018), 84 mg (10/23/2018), 84 mg (10/30/2018)  for chemotherapy treatment.     11/13/2018 -  Chemotherapy    The patient had trastuzumab (HERCEPTIN) 504 mg in sodium chloride 0.9 % 250 mL chemo infusion, 6 mg/kg = 504 mg (100 % of original dose 6 mg/kg), Intravenous,  Once, 2 of 5 cycles Dose modification: 6 mg/kg (original dose 6 mg/kg, Cycle 1, Reason: Other (see comments), Comment: pt receving it ) Administration: 504 mg (11/13/2018), 504 mg (12/04/2018)  for chemotherapy treatment.       CANCER STAGING: Cancer Staging Malignant neoplasm of upper-outer quadrant of right breast in female, estrogen receptor negative ([place] Staging form: Breast, AJCC 8th Edition - Clinical: Stage IIB (cT3, cN0, cM0, G2, ER-, PR-, HER2+) - Signed by GNicholas Lose MD on 07/15/2018    INTERVAL HISTORY:  Ms. [id number]. female returns for routine follow-up for right breast cancer. She is here today with her husband. She is doing well since her last. Her rash is decreased on her legs. She is ready for her next treatment. Denies any nausea, vomiting, or diarrhea. Denies any new pains. Had not noticed any recent bleeding such as epistaxis, hematuria or hematochezia. Denies recent chest pain on  exertion, shortness of breath on minimal exertion, pre-syncopal episodes, or palpitations. Denies any numbness or tingling in hands or feet. Denies any recent fevers, infections, or recent hospitalizations. Patient reports appetite at 100% and energy level at 75%. She is eating well and maintaining her weight at this time.     REVIEW OF SYSTEMS:  Review of Systems    Constitutional: Positive for fatigue.  All other systems reviewed and are negative.    PAST MEDICAL/SURGICAL HISTORY:  Past Medical History:  Diagnosis Date  . Cancer ([facility]) 06/2018   right breast cancer  . GERD (gastroesophageal reflux disease)    occasional   . Hyperlipidemia   . Hypertension    clearance with note Dr Nevada Crane on chart  . Pneumonia    2 years ago/ states occ cough with sinus drainage- no fever  . Thyroid nodule    with biopsy- states following medically   Past Surgical History:  Procedure Laterality Date  . CATARACT EXTRACTION W/PHACO  11/30/2012   Procedure: CATARACT EXTRACTION PHACO AND INTRAOCULAR LENS PLACEMENT (IOC);  Surgeon: Williams Che, MD;  Location: [facility];  Service: Ophthalmology;  Laterality: Left;  CDE:11.49  . CATARACT EXTRACTION W/PHACO Right 03/15/2013   Procedure: CATARACT EXTRACTION PHACO AND INTRAOCULAR LENS PLACEMENT (IOC);  Surgeon: Williams Che, MD;  Location: [facility];  Service: Ophthalmology;  Laterality: Right;  CDE 16.15  . COLONOSCOPY    . EXCISION OF SKIN TAG Right 06/17/2016   Procedure: SHAVE OF SKIN TAG RIGHT BUTTOCK;  Surgeon: Aviva Signs, MD;  Location: [facility];  Service: General;  Laterality: Right;  . JOINT REPLACEMENT     left knee  . MASS EXCISION Left 06/17/2016   Procedure: EXCISION SKIN MALIGNANT LESION LEFT BUTTOCK;  Surgeon: Aviva Signs, MD;  Location: [facility];  Service: General;  Laterality: Left;  . PORTACATH PLACEMENT Right 07/28/2018   Procedure: INSERTION PORT-A-CATH WITH ULTRASOUND;  Surgeon: Rolm Bookbinder, MD;  Location: [facility];  Service: General;  Laterality: Right;  . PUNCH BIOPSY OF SKIN Right 07/28/2018   Procedure: PUNCH BIOPSY OF SKIN RIGHT BREAST;  Surgeon: Rolm Bookbinder, MD;  Location: [facility];  Service: General;  Laterality: Right;  . TOTAL KNEE ARTHROPLASTY  12/16/2011   Procedure: TOTAL KNEE ARTHROPLASTY;  Surgeon: Gearlean Alf, MD;  Location: [facility];   Service: Orthopedics;  Laterality: Right;  . TUBAL LIGATION       SOCIAL HISTORY:  Social History   Socioeconomic History  . Marital status: Married    Spouse name: Not on file  . Number of children: 2  . Years of education: some business school after HS  . Highest education level: Not on file  Occupational History  . Occupation: Cabin crew  . Occupation: Network engineer   Social Needs  . Financial resource strain: Not hard at all  . Food insecurity:    Worry: Never true    Inability: Never true  . Transportation needs:    Medical: No    Non-medical: No  Tobacco Use  . Smoking status: Never Smoker  . Smokeless tobacco: Never Used  Substance and Sexual Activity  . Alcohol use: No    Frequency: Never  . Drug use: No  . Sexual activity: Yes    Birth control/protection: None  Lifestyle  . Physical activity:    Days per week: 0 days    Minutes per session: 0 min  . Stress: Not at all  Relationships  . Social connections:  Talks on phone: More than three times a week    Gets together: More than three times a week    Attends religious service: More than 4 times per year    Active member of club or organization: Yes    Attends meetings of clubs or organizations: Never    Relationship status: Married  . Intimate partner violence:    Fear of current or ex partner: No    Emotionally abused: No    Physically abused: No    Forced sexual activity: No  Other Topics Concern  . Not on file  Social History Narrative   Lives at home with her husband.   4 cups caffeine per day.   Right-handed.    FAMILY HISTORY:  Family History  Problem Relation Age of Onset  . Heart attack Mother   . Bronchitis Father   . Diabetes Sister   . Leukemia Brother   . Lung disease Brother   . Lung disease Sister     CURRENT MEDICATIONS:  Outpatient Encounter Medications as of 12/04/2018  Medication Sig  . acetaminophen (TYLENOL) 325 MG tablet Take 650 mg by mouth every 6 (six) hours as needed.   Marland Kitchen aspirin 325 MG tablet Take 162.5 mg by mouth daily.   . cetirizine (ZYRTEC) 10 MG tablet Take 10 mg by mouth daily.  . diphenhydrAMINE (BENADRYL) 25 MG tablet Take 25 mg by mouth at bedtime as needed.  . hydrocortisone 1 % ointment Apply 1 application topically 2 (two) times daily.  Marland Kitchen lidocaine-prilocaine (EMLA) cream Apply to port site one hour prior to appointment and cover with plastic wrap.  . Misc. Devices MISC Please provide lymphedema sleeve for right arm  . Multiple Vitamin (MULTIVITAMIN WITH MINERALS) TABS tablet Take 1 tablet by mouth daily.  . Omega-3 Fatty Acids (FISH OIL PO) Take 2 capsules by mouth daily.   Marland Kitchen PACLitaxel (TAXOL IV) Inject into the vein once a week.  . pantoprazole (PROTONIX) 40 MG tablet Take 40 mg by mouth daily.  . prochlorperazine (COMPAZINE) 10 MG tablet Take 1 tablet (10 mg total) by mouth every 6 (six) hours as needed (Nausea or vomiting). (Patient not taking: Reported on 11/13/2018)  . simvastatin (ZOCOR) 10 MG tablet Take 10 mg by mouth daily.  . Trastuzumab (HERCEPTIN IV) Inject into the vein once a week.   Marland Kitchen UNABLE TO FIND Take 1 capsule by mouth daily. Med Name: Tumeric Curcumin   No facility-administered encounter medications on file as of 12/04/2018.     ALLERGIES:  Allergies  Allergen Reactions  . Penicillins Rash    Has patient had a PCN reaction causing immediate rash, facial/tongue/throat swelling, SOB or lightheadedness with hypotension: No Has patient had a PCN reaction causing severe rash involving mucus membranes or skin necrosis: No Has patient had a PCN reaction that required hospitalization: No Has patient had a PCN reaction occurring within the last 10 years: No If all of the above answers are "NO", then may proceed with Cephalosporin use.      PHYSICAL EXAM:  ECOG Performance status: 1  Vitals:   12/04/18 0900  BP: (!) 148/79  Pulse: 100  Resp: 18  Temp: 98.2 F (36.8 C)  SpO2: 97%   Filed Weights   12/04/18 0900    Weight: 178 lb 8 oz (81 kg)    Physical Exam Constitutional:      Appearance: Normal appearance. She is normal weight.  Cardiovascular:     Rate and Rhythm: Normal rate and  regular rhythm.     Pulses: Normal pulses.     Heart sounds: Normal heart sounds.  Pulmonary:     Breath sounds: Normal breath sounds.  Musculoskeletal: Normal range of motion.  Skin:    General: Skin is warm and dry.  Neurological:     Mental Status: She is alert and oriented to person, place, and time. Mental status is at baseline.  Psychiatric:        Mood and Affect: Mood normal.        Behavior: Behavior normal.        Thought Content: Thought content normal.        Judgment: Judgment normal.   Breast: RIGHT: redness is slightly decreased, no pain LRFT: No palpable masses, no skin changes or nipple discharge, no adenopathy.   LABORATORY DATA:  I have reviewed the labs as listed.  CBC    Component Value Date/Time   WBC 9.4 12/04/2018 0841   RBC 3.76 (L) 12/04/2018 0841   HGB 10.6 (L) 12/04/2018 0841   HGB 12.7 11/24/2017 1626   HCT 33.8 (L) 12/04/2018 0841   HCT 36.6 11/24/2017 1626   PLT 434 (H) 12/04/2018 0841   PLT 420 (H) 11/24/2017 1626   MCV 89.9 12/04/2018 0841   MCV 88 11/24/2017 1626   MCH 28.2 12/04/2018 0841   MCHC 31.4 12/04/2018 0841   RDW 15.6 (H) 12/04/2018 0841   RDW 15.4 11/24/2017 1626   LYMPHSABS 3.6 12/04/2018 0841   LYMPHSABS 2.5 11/24/2017 1626   MONOABS 0.8 12/04/2018 0841   EOSABS 0.5 12/04/2018 0841   EOSABS 0.1 11/24/2017 1626   BASOSABS 0.1 12/04/2018 0841   BASOSABS 0.0 11/24/2017 1626   CMP Latest Ref Rng & Units 12/04/2018 11/13/2018 10/30/2018  Glucose 70 - 99 mg/dL 116(H) 113(H) 106(H)  BUN 8 - 23 mg/dL _0 Creatinine 0.44 - 1.00 mg/dL 1.05(H) 1.06(H) 1.07(H)  Sodium 135 - 145 mmol/L 132(L) 132(L) 134(L)  Potassium 3.5 - 5.1 mmol/L 3.9 4.1 4.0  Chloride 98 - 111 mmol/L 98 97(L) 102  CO2 22 - 32 mmol/L _1 Calcium 8.9 - 10.3 mg/dL 9.0 9.2  8.8(L)  Total Protein 6.5 - 8.1 g/dL 6.8 6.8 6.4(L)  Total Bilirubin 0.3 - 1.2 mg/dL 0.3 0.7 0.4  Alkaline Phos 38 - 126 U/L 87 89 76  AST 15 - 41 U/L _2 ALT 0 - 44 U/L _3 DIAGNOSTIC IMAGING:  I have independently reviewed the scans and discussed with the patient.   I have reviewed Francene Finders, NP's note and agree with the documentation.  I personally performed a face-to-face visit, made revisions and my assessment and plan is as follows.    ASSESSMENT & PLAN:   Malignant neoplasm of upper-outer quadrant of right breast in female, estrogen receptor negative ([facility]) 1.  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 - Right breast biopsy at 11:30 position on 07/03/2018 shows invasive ductal carcinoma, ER/PR negative, HER-2 positive, Ki-67 of 40%, right axillary lymph node biopsy shows benign tissue,  left breast core needle biopsy at 230 o'clock position shows complex sclerosing lesion - CT chest abdomen and pelvis on 07/16/2018 showed a 6 mm nodule in the posterior left lower lobe with no other evidence of metastatic disease. -Bone scan on 07/16/2018 shows focal area of increased activity over the distal left clavicle/shoulder consistent with degenerative changes. - Skin punch biopsy of the right breast on 07/28/2018 was positive for dermal lymphatic invasion. -Echo on 07/27/2018 shows ejection fraction of 55 to 60%. -She was started on weekly paclitaxel and Herceptin on 07/31/2018. - She completed week 8 of Herceptin and week 7 of Taxol on  09/18/2018. - Week 9 Taxol was cut back to 45 mg/m on 10/02/2018.  She tolerated it very well. - Week 12 of paclitaxel completed on 10/30/2018. -Physical exam today revealed erythema in the upper outer quadrant of the right breast which is stable.  No lymphadenopathy palpable.  There is mild erythema in the right axillary region.  This is also stable.  Vague mass in the right breast behind the retroareolar area is freely mobile.   - PET CT scan on 11/30/2018 shows small to medium sized focus of increased uptake within the right breast compatible with residual hypermetabolic tumor.  No specific findings identified to suggest FDG avid metastatic disease.  On the nondiagnostic CT portion of the exam there are several nodules identified in both lungs which are too small to reliably characterized by PET/CT.  These were not confidently identified on CT with contrast on 07/16/2018.  CT of the chest with contrast to confirm its presence was recommended.  Extensive sigmoid diverticulosis.  Multiple foci of intense uptake identified within the sigmoid colon. -2D echo on 11/16/2018 shows LVEF of 60 to 65%. -We will order CT of the chest with contrast to look into lung nodules.  He may proceed with her Herceptin today. - We will see her back in 3 weeks for follow-up.  She was apparently seen by Dr. Donne Hazel and has a follow-up visit in April.  2.  Taxane induced skin rash: - She developed erythematous maculopapular rash on extremities, predominantly on shins after week 5 of paclitaxel.  - This is improving since she completed paclitaxel 2 weeks ago.      Orders placed this encounter:  Orders Placed This Encounter  Procedures  . CT Chest W Contrast      Derek Jack, MD [place] [phone]

## 2018-12-04 NOTE — Patient Instructions (Signed)
[facility] at [facility] Discharge Instructions     Thank you for choosing [facility] at [facility] to provide your oncology and hematology care.  To afford each patient quality time with our provider, please arrive at least 15 minutes before your scheduled appointment time.   If you have a lab appointment with the Cancer Center please come in thru the  Main Entrance and check in at the main information desk  You need to re-schedule your appointment should you arrive 10 or more minutes late.  We strive to give you quality time with our providers, and arriving late affects you and other patients whose appointments are after yours.  Also, if you no show three or more times for appointments you may be dismissed from the clinic at the providers discretion.     Again, thank you for choosing [facility].  Our hope is that these requests will decrease the amount of time that you wait before being seen by our physicians.       _____________________________________________________________  Should you have questions after your visit to  Cancer Center, please contact our office at [phone] between the hours of 8:00 a.m. and 4:30 p.m.  Voicemails left after 4:00 p.m. will not be returned until the following business day.  For prescription refill requests, have your pharmacy contact our office and allow 72 hours.    Cancer Center Support Programs:   > Cancer Support Group  2nd Tuesday of the month 1pm-2pm, Journey Room    

## 2018-12-04 NOTE — Patient Instructions (Signed)
[facility] Discharge Instructions for Patients Receiving Chemotherapy   Beginning January 23rd 2017 lab work for the Cancer Center will be done in the  Main lab at [facility] on 1st floor. If you have a lab appointment with the Cancer Center please come in thru the  Main Entrance and check in at the main information desk   Today you received the following chemotherapy agents Herceptin. Follow-up as scheduled. Call clinic for any questions or concerns  To help prevent nausea and vomiting after your treatment, we encourage you to take your nausea medication.   If you develop nausea and vomiting, or diarrhea that is not controlled by your medication, call the clinic.  The clinic phone number is [phone]. Office hours are Monday-Friday 8:30am-5:00pm.  BELOW ARE SYMPTOMS THAT SHOULD BE REPORTED IMMEDIATELY:  *FEVER GREATER THAN 101.0 F  *CHILLS WITH OR WITHOUT FEVER  NAUSEA AND VOMITING THAT IS NOT CONTROLLED WITH YOUR NAUSEA MEDICATION  *UNUSUAL SHORTNESS OF BREATH  *UNUSUAL BRUISING OR BLEEDING  TENDERNESS IN MOUTH AND THROAT WITH OR WITHOUT PRESENCE OF ULCERS  *URINARY PROBLEMS  *BOWEL PROBLEMS  UNUSUAL RASH Items with * indicate a potential emergency and should be followed up as soon as possible. If you have an emergency after office hours please contact your primary care physician or go to the nearest emergency department.  Please call the clinic during office hours if you have any questions or concerns.   You may also contact the Patient Navigator at [phone] should you have any questions or need assistance in obtaining follow up care.      Resources For Cancer Patients and their Caregivers ? American Cancer Society: Can assist with transportation, wigs, general needs, runs Look Good Feel Better.        1-888-227-6333 ? Cancer Care: Provides financial assistance, online support groups, medication/co-pay assistance.  1-800-813-HOPE  (4673) ? [facility] Assists [place] cancer patients and their families through emotional , educational and financial support.  336-427-4357 ? [place] DSS Where to apply for food stamps, Medicaid and utility assistance. 336-342-1394 ? [facility]: Transportation to medical appointments. 336-347-2287 ? Social Security Administration: May apply for disability if have a Stage IV cancer. 336-342-7796 1-800-772-1213 ? [place] Aging, Disability and Transit Services: Assists with nutrition, care and transit needs. 336-349-2343         

## 2018-12-04 NOTE — Progress Notes (Signed)
1005 Lab and Echo results reviewed with and pt seen by Dr. [name] and pt approved for Herceptin infusion per MD.                                                                [name] tolerated Herceptin infusion well without complaints or incident. VSS upon discharge. Pt discharged self ambulatory in satisfactory condition accompanied by her husband

## 2018-12-07 ENCOUNTER — Encounter (HOSPITAL_COMMUNITY): Payer: Self-pay | Admitting: Hematology

## 2018-12-07 NOTE — Progress Notes (Signed)
Faxed referral and patient records to [facility] for lymphedema eval and treat. [phone].

## 2018-12-15 DIAGNOSIS — I890 Lymphedema, not elsewhere classified: Secondary | ICD-10-CM | POA: Diagnosis not present

## 2018-12-21 DIAGNOSIS — I890 Lymphedema, not elsewhere classified: Secondary | ICD-10-CM | POA: Diagnosis not present

## 2018-12-24 ENCOUNTER — Ambulatory Visit (HOSPITAL_COMMUNITY)
Admission: RE | Admit: 2018-12-24 | Discharge: 2018-12-24 | Disposition: A | Discharge status: Home / Self Care | Payer: Medicare HMO | Source: Ambulatory Visit | Attending: Nurse Practitioner | Admitting: Nurse Practitioner

## 2018-12-24 DIAGNOSIS — Z171 Estrogen receptor negative status [ER-]: Secondary | ICD-10-CM | POA: Insufficient documentation

## 2018-12-24 DIAGNOSIS — R918 Other nonspecific abnormal finding of lung field: Secondary | ICD-10-CM | POA: Diagnosis present

## 2018-12-24 DIAGNOSIS — R911 Solitary pulmonary nodule: Secondary | ICD-10-CM | POA: Diagnosis not present

## 2018-12-24 DIAGNOSIS — C50911 Malignant neoplasm of unspecified site of right female breast: Secondary | ICD-10-CM | POA: Diagnosis not present

## 2018-12-24 DIAGNOSIS — C50411 Malignant neoplasm of upper-outer quadrant of right female breast: Secondary | ICD-10-CM | POA: Diagnosis not present

## 2018-12-24 DIAGNOSIS — Z5111 Encounter for antineoplastic chemotherapy: Secondary | ICD-10-CM | POA: Diagnosis not present

## 2018-12-24 MED ORDER — IOHEXOL 300 MG/ML  SOLN
75.0000 mL | Freq: Once | INTRAMUSCULAR | Status: AC | PRN
  Administered 2018-12-24: 75 mL via INTRAVENOUS

## 2018-12-25 DIAGNOSIS — I890 Lymphedema, not elsewhere classified: Secondary | ICD-10-CM | POA: Diagnosis not present

## 2018-12-28 DIAGNOSIS — I890 Lymphedema, not elsewhere classified: Secondary | ICD-10-CM | POA: Diagnosis not present

## 2018-12-29 ENCOUNTER — Inpatient Hospital Stay (HOSPITAL_COMMUNITY): Payer: Medicare HMO

## 2018-12-29 ENCOUNTER — Inpatient Hospital Stay (HOSPITAL_BASED_OUTPATIENT_CLINIC_OR_DEPARTMENT_OTHER): Payer: Medicare HMO | Admitting: Hematology

## 2018-12-29 ENCOUNTER — Inpatient Hospital Stay (HOSPITAL_COMMUNITY): Payer: Medicare HMO | Attending: Hematology

## 2018-12-29 ENCOUNTER — Encounter (HOSPITAL_COMMUNITY): Payer: Self-pay | Admitting: Hematology

## 2018-12-29 ENCOUNTER — Other Ambulatory Visit: Payer: Self-pay

## 2018-12-29 VITALS — BP 155/74 | HR 108 | Temp 98.3°F | Resp 18 | Wt 178.5 lb

## 2018-12-29 DIAGNOSIS — L539 Erythematous condition, unspecified: Secondary | ICD-10-CM | POA: Insufficient documentation

## 2018-12-29 DIAGNOSIS — E785 Hyperlipidemia, unspecified: Secondary | ICD-10-CM

## 2018-12-29 DIAGNOSIS — K219 Gastro-esophageal reflux disease without esophagitis: Secondary | ICD-10-CM

## 2018-12-29 DIAGNOSIS — C50411 Malignant neoplasm of upper-outer quadrant of right female breast: Secondary | ICD-10-CM

## 2018-12-29 DIAGNOSIS — Z79899 Other long term (current) drug therapy: Secondary | ICD-10-CM | POA: Diagnosis not present

## 2018-12-29 DIAGNOSIS — R740 Nonspecific elevation of levels of transaminase and lactic acid dehydrogenase [LDH]: Secondary | ICD-10-CM

## 2018-12-29 DIAGNOSIS — Z9221 Personal history of antineoplastic chemotherapy: Secondary | ICD-10-CM

## 2018-12-29 DIAGNOSIS — Z5112 Encounter for antineoplastic immunotherapy: Secondary | ICD-10-CM

## 2018-12-29 DIAGNOSIS — R918 Other nonspecific abnormal finding of lung field: Secondary | ICD-10-CM | POA: Diagnosis not present

## 2018-12-29 DIAGNOSIS — I10 Essential (primary) hypertension: Secondary | ICD-10-CM | POA: Diagnosis not present

## 2018-12-29 DIAGNOSIS — Z7982 Long term (current) use of aspirin: Secondary | ICD-10-CM

## 2018-12-29 DIAGNOSIS — Z171 Estrogen receptor negative status [ER-]: Secondary | ICD-10-CM

## 2018-12-29 LAB — COMPREHENSIVE METABOLIC PANEL
ALT: 457 U/L — ABNORMAL HIGH (ref 0–44)
ANION GAP: 9 (ref 5–15)
AST: 405 U/L — ABNORMAL HIGH (ref 15–41)
Albumin: 3.5 g/dL (ref 3.5–5.0)
Alkaline Phosphatase: 115 U/L (ref 38–126)
BILIRUBIN TOTAL: 0.4 mg/dL (ref 0.3–1.2)
BUN: 18 mg/dL (ref 8–23)
CO2: 25 mmol/L (ref 22–32)
Calcium: 9.3 mg/dL (ref 8.9–10.3)
Chloride: 100 mmol/L (ref 98–111)
Creatinine, Ser: 1.04 mg/dL — ABNORMAL HIGH (ref 0.44–1.00)
GFR calc Af Amer: 56 mL/min — ABNORMAL LOW (ref >60)
GFR calc non Af Amer: 49 mL/min — ABNORMAL LOW (ref >60)
Glucose, Bld: 114 mg/dL — ABNORMAL HIGH (ref 70–99)
Potassium: 4.4 mmol/L (ref 3.5–5.1)
Sodium: 134 mmol/L — ABNORMAL LOW (ref 135–145)
TOTAL PROTEIN: 6.8 g/dL (ref 6.5–8.1)

## 2018-12-29 LAB — CBC WITH DIFFERENTIAL/PLATELET
Abs Immature Granulocytes: 0.03 10*3/uL (ref 0.00–0.07)
Basophils Absolute: 0.1 10*3/uL (ref 0.0–0.1)
Basophils Relative: 1 %
Eosinophils Absolute: 0.6 10*3/uL — ABNORMAL HIGH (ref 0.0–0.5)
Eosinophils Relative: 6 %
HCT: 35.5 % — ABNORMAL LOW (ref 36.0–46.0)
Hemoglobin: 11.2 g/dL — ABNORMAL LOW (ref 12.0–15.0)
Immature Granulocytes: 0 %
Lymphocytes Relative: 34 %
Lymphs Abs: 3.4 10*3/uL (ref 0.7–4.0)
MCH: 28.3 pg (ref 26.0–34.0)
MCHC: 31.5 g/dL (ref 30.0–36.0)
MCV: 89.6 fL (ref 80.0–100.0)
Monocytes Absolute: 1.1 10*3/uL — ABNORMAL HIGH (ref 0.1–1.0)
Monocytes Relative: 11 %
Neutro Abs: 4.9 10*3/uL (ref 1.7–7.7)
Neutrophils Relative %: 48 %
Platelets: 394 10*3/uL (ref 150–400)
RBC: 3.96 MIL/uL (ref 3.87–5.11)
RDW: 15.2 % (ref 11.5–15.5)
WBC: 10 10*3/uL (ref 4.0–10.5)
nRBC: 0 % (ref 0.0–0.2)

## 2018-12-29 NOTE — Progress Notes (Signed)
No treatment today per MD.  

## 2018-12-29 NOTE — Progress Notes (Signed)
[place], [place] [address]   CLINIC:  Medical Oncology/Hematology  PCP:  [name], MD [place] [address] [phone]   REASON FOR VISIT: Follow-up for right breast cancer, ER-/PR-/HER2+  CURRENT THERAPY:Herceptin every 3 weeks.  BRIEF ONCOLOGIC HISTORY:    Malignant neoplasm of upper-outer quadrant of right breast in female, estrogen receptor negative ([facility])   07/03/2018 Initial Diagnosis    Palpable right breast mass for several months with skin discoloration, clinically skin trabecular thickening with nipple retraction measuring 7.2 x 4.3 x 4.2 cm, additional mass 4 o'clock position 2.6 cm, right axillary tail 1.2 cm right axillary lymph node 1.8 cm left breast irregular mass 1 cm, adjacent mass 0.9 cm together measuring 1.9 cm, no lymphadenopathy    07/03/2018 Pathology Results    Right breast biopsy: IDC grade 2, ER 0%, PR 0%, HER-2 positive, Ki-67 40%, lymph node biopsy negative, left breast biopsy complex sclerosing lesion     07/15/2018 Cancer Staging    Staging form: Breast, AJCC 8th Edition - Clinical: Stage IIB (cT3, cN0, cM0, G2, ER-, PR-, HER2+) - Signed by [name], MD on 07/15/2018    07/31/2018 - 10/30/2018 Chemotherapy    The patient had trastuzumab (HERCEPTIN) 336 mg in sodium chloride 0.9 % 250 mL chemo infusion, 4 mg/kg = 336 mg, Intravenous,  Once, 4 of 4 cycles Administration: 336 mg (07/31/2018), 168 mg (08/07/2018), 168 mg (08/28/2018), 168 mg (08/14/2018), 168 mg (08/21/2018), 168 mg (09/04/2018), 168 mg (09/11/2018), 168 mg (09/18/2018), 168 mg (10/02/2018), 168 mg (10/09/2018), 168 mg (10/16/2018), 168 mg (10/23/2018), 168 mg (10/30/2018) PACLitaxel (TAXOL) 78 mg in sodium chloride 0.9 % 250 mL chemo infusion (</= 49m/m2), 40 mg/m2 = 78 mg (50 % of original dose 80 mg/m2), Intravenous,  Once, 4 of 4 cycles Dose modification: 40 mg/m2 (50 % of original dose 80 mg/m2, Cycle 1, Reason: Patient Age),  53.3333 mg/m2 (66.7 % of original dose 80 mg/m2, Cycle 1, Reason: Provider Judgment), 45 mg/m2 (original dose 80 mg/m2, Cycle 3, Reason: Provider Judgment) Administration: 78 mg (07/31/2018), 102 mg (08/07/2018), 102 mg (08/28/2018), 102 mg (08/14/2018), 102 mg (08/21/2018), 102 mg (09/04/2018), 102 mg (09/18/2018), 84 mg (10/02/2018), 84 mg (10/09/2018), 84 mg (10/16/2018), 84 mg (10/23/2018), 84 mg (10/30/2018)  for chemotherapy treatment.     11/13/2018 -  Chemotherapy    The patient had trastuzumab (HERCEPTIN) 504 mg in sodium chloride 0.9 % 250 mL chemo infusion, 6 mg/kg = 504 mg (100 % of original dose 6 mg/kg), Intravenous,  Once, 2 of 5 cycles Dose modification: 6 mg/kg (original dose 6 mg/kg, Cycle 1, Reason: Other (see comments), Comment: pt receving it ) Administration: 504 mg (11/13/2018), 504 mg (12/04/2018)  for chemotherapy treatment.       CANCER STAGING: Cancer Staging Malignant neoplasm of upper-outer quadrant of right breast in female, estrogen receptor negative ([facility] Staging form: Breast, AJCC 8th Edition - Clinical: Stage IIB (cT3, cN0, cM0, G2, ER-, PR-, HER2+) - Signed by GNicholas Lose MD on 07/15/2018    INTERVAL HISTORY:  Ms. [id number]. female returns for routine follow-up for right breast cancer.  Denies any fevers, night sweats or weight loss.  Energy levels are 50% and appetite is 100%.  Denies any new onset pains.  No breast pain was reported.  No skin rashes were reported on the extremities.  Denies any ER visits or urgent care visits.  Denies any new medications.  REVIEW OF SYSTEMS:  Review of Systems  Constitutional: Negative for fatigue.  All other systems reviewed and are negative.    PAST MEDICAL/SURGICAL HISTORY:  Past Medical History:  Diagnosis Date  . Cancer ([facility]) 06/2018   right breast cancer  . GERD (gastroesophageal reflux disease)    occasional   . Hyperlipidemia   . Hypertension    clearance with note Dr Nevada Crane on chart  . Pneumonia    2  years ago/ states occ cough with sinus drainage- no fever  . Thyroid nodule    with biopsy- states following medically   Past Surgical History:  Procedure Laterality Date  . CATARACT EXTRACTION W/PHACO  11/30/2012   Procedure: CATARACT EXTRACTION PHACO AND INTRAOCULAR LENS PLACEMENT (IOC);  Surgeon: Williams Che, MD;  Location: [facility];  Service: Ophthalmology;  Laterality: Left;  CDE:11.49  . CATARACT EXTRACTION W/PHACO Right 03/15/2013   Procedure: CATARACT EXTRACTION PHACO AND INTRAOCULAR LENS PLACEMENT (IOC);  Surgeon: Williams Che, MD;  Location: [facility];  Service: Ophthalmology;  Laterality: Right;  CDE 16.15  . COLONOSCOPY    . EXCISION OF SKIN TAG Right 06/17/2016   Procedure: SHAVE OF SKIN TAG RIGHT BUTTOCK;  Surgeon: Aviva Signs, MD;  Location: [facility];  Service: General;  Laterality: Right;  . JOINT REPLACEMENT     left knee  . MASS EXCISION Left 06/17/2016   Procedure: EXCISION SKIN MALIGNANT LESION LEFT BUTTOCK;  Surgeon: Aviva Signs, MD;  Location: [facility];  Service: General;  Laterality: Left;  . PORTACATH PLACEMENT Right 07/28/2018   Procedure: INSERTION PORT-A-CATH WITH ULTRASOUND;  Surgeon: Rolm Bookbinder, MD;  Location: [facility];  Service: General;  Laterality: Right;  . PUNCH BIOPSY OF SKIN Right 07/28/2018   Procedure: PUNCH BIOPSY OF SKIN RIGHT BREAST;  Surgeon: Rolm Bookbinder, MD;  Location: [facility];  Service: General;  Laterality: Right;  . TOTAL KNEE ARTHROPLASTY  12/16/2011   Procedure: TOTAL KNEE ARTHROPLASTY;  Surgeon: Gearlean Alf, MD;  Location: [facility];  Service: Orthopedics;  Laterality: Right;  . TUBAL LIGATION       SOCIAL HISTORY:  Social History   Socioeconomic History  . Marital status: Married    Spouse name: Not on file  . Number of children: 2  . Years of education: some business school after HS  . Highest education level: Not on file  Occupational History  . Occupation: Cabin crew  . Occupation:  Network engineer   Social Needs  . Financial resource strain: Not hard at all  . Food insecurity:    Worry: Never true    Inability: Never true  . Transportation needs:    Medical: No    Non-medical: No  Tobacco Use  . Smoking status: Never Smoker  . Smokeless tobacco: Never Used  Substance and Sexual Activity  . Alcohol use: No    Frequency: Never  . Drug use: No  . Sexual activity: Yes    Birth control/protection: None  Lifestyle  . Physical activity:    Days per week: 0 days    Minutes per session: 0 min  . Stress: Not at all  Relationships  . Social connections:    Talks on phone: More than three times a week    Gets together: More than three times a week    Attends religious service: More than 4 times per year    Active member of club or organization: Yes    Attends meetings of clubs or organizations: Never  Relationship status: Married  . Intimate partner violence:    Fear of current or ex partner: No    Emotionally abused: No    Physically abused: No    Forced sexual activity: No  Other Topics Concern  . Not on file  Social History Narrative   Lives at home with her husband.   4 cups caffeine per day.   Right-handed.    FAMILY HISTORY:  Family History  Problem Relation Age of Onset  . Heart attack Mother   . Bronchitis Father   . Diabetes Sister   . Leukemia Brother   . Lung disease Brother   . Lung disease Sister     CURRENT MEDICATIONS:  Outpatient Encounter Medications as of 12/29/2018  Medication Sig  . acetaminophen (TYLENOL) 325 MG tablet Take 650 mg by mouth every 6 (six) hours as needed.  Marland Kitchen aspirin 325 MG tablet Take 162.5 mg by mouth daily.   . cetirizine (ZYRTEC) 10 MG tablet Take 10 mg by mouth daily.  . diphenhydrAMINE (BENADRYL) 25 MG tablet Take 25 mg by mouth at bedtime as needed.  . hydrocortisone 1 % ointment Apply 1 application topically 2 (two) times daily.  Marland Kitchen lidocaine-prilocaine (EMLA) cream Apply to port site one hour prior to  appointment and cover with plastic wrap.  . Misc. Devices MISC Please provide lymphedema sleeve for right arm  . Multiple Vitamin (MULTIVITAMIN WITH MINERALS) TABS tablet Take 1 tablet by mouth daily.  . Omega-3 Fatty Acids (FISH OIL PO) Take 2 capsules by mouth daily.   Marland Kitchen PACLitaxel (TAXOL IV) Inject into the vein once a week.  . pantoprazole (PROTONIX) 40 MG tablet Take 40 mg by mouth daily.  . prochlorperazine (COMPAZINE) 10 MG tablet Take 1 tablet (10 mg total) by mouth every 6 (six) hours as needed (Nausea or vomiting).  . simvastatin (ZOCOR) 10 MG tablet Take 10 mg by mouth daily.  . Trastuzumab (HERCEPTIN IV) Inject into the vein once a week.   Marland Kitchen UNABLE TO FIND Take 1 capsule by mouth daily. Med Name: Tumeric Curcumin   No facility-administered encounter medications on file as of 12/29/2018.     ALLERGIES:  Allergies  Allergen Reactions  . Penicillins Rash    Has patient had a PCN reaction causing immediate rash, facial/tongue/throat swelling, SOB or lightheadedness with hypotension: No Has patient had a PCN reaction causing severe rash involving mucus membranes or skin necrosis: No Has patient had a PCN reaction that required hospitalization: No Has patient had a PCN reaction occurring within the last 10 years: No If all of the above answers are "NO", then may proceed with Cephalosporin use.      PHYSICAL EXAM:  ECOG Performance status: 1  Vitals:   12/29/18 0901  BP: (!) 155/74  Pulse: (!) 108  Resp: 18  Temp: 98.3 F (36.8 C)  SpO2: 98%   Filed Weights   12/29/18 0901  Weight: 178 lb 8 oz (81 kg)    Physical Exam Constitutional:      Appearance: Normal appearance. She is normal weight.  Cardiovascular:     Rate and Rhythm: Normal rate and regular rhythm.     Pulses: Normal pulses.     Heart sounds: Normal heart sounds.  Pulmonary:     Breath sounds: Normal breath sounds.  Musculoskeletal: Normal range of motion.  Skin:    General: Skin is warm and dry.    Neurological:     Mental Status: She is alert and oriented to  person, place, and time. Mental status is at baseline.  Psychiatric:        Mood and Affect: Mood normal.        Behavior: Behavior normal.        Thought Content: Thought content normal.        Judgment: Judgment normal.   Breast: RIGHT: redness is slightly decreased, no pain LRFT: No palpable masses, no skin changes or nipple discharge, no adenopathy.   LABORATORY DATA:  I have reviewed the labs as listed.  CBC    Component Value Date/Time   WBC 10.0 12/29/2018 0824   RBC 3.96 12/29/2018 0824   HGB 11.2 (L) 12/29/2018 0824   HGB 12.7 11/24/2017 1626   HCT 35.5 (L) 12/29/2018 0824   HCT 36.6 11/24/2017 1626   PLT 394 12/29/2018 0824   PLT 420 (H) 11/24/2017 1626   MCV 89.6 12/29/2018 0824   MCV 88 11/24/2017 1626   MCH 28.3 12/29/2018 0824   MCHC 31.5 12/29/2018 0824   RDW 15.2 12/29/2018 0824   RDW 15.4 11/24/2017 1626   LYMPHSABS 3.4 12/29/2018 0824   LYMPHSABS 2.5 11/24/2017 1626   MONOABS 1.1 (H) 12/29/2018 0824   EOSABS 0.6 (H) 12/29/2018 0824   EOSABS 0.1 11/24/2017 1626   BASOSABS 0.1 12/29/2018 0824   BASOSABS 0.0 11/24/2017 1626   CMP Latest Ref Rng & Units 12/29/2018 12/04/2018 11/13/2018  Glucose 70 - 99 mg/dL 114(H) 116(H) 113(H)  BUN 8 - 23 mg/dL 18 13 12   Creatinine 0.44 - 1.00 mg/dL 1.04(H) 1.05(H) 1.06(H)  Sodium 135 - 145 mmol/L 134(L) 132(L) 132(L)  Potassium 3.5 - 5.1 mmol/L 4.4 3.9 4.1  Chloride 98 - 111 mmol/L 100 98 97(L)  CO2 22 - 32 mmol/L 25 24 24   Calcium 8.9 - 10.3 mg/dL 9.3 9.0 9.2  Total Protein 6.5 - 8.1 g/dL 6.8 6.8 6.8  Total Bilirubin 0.3 - 1.2 mg/dL 0.4 0.3 0.7  Alkaline Phos 38 - 126 U/L 115 87 89  AST 15 - 41 U/L 405(H) 27 29  ALT 0 - 44 U/L 457(H) 20 24       DIAGNOSTIC IMAGING:  I have independently reviewed the scans and discussed with the patient.     ASSESSMENT & PLAN:   Malignant neoplasm of upper-outer quadrant of right breast in female, estrogen  receptor negative ([facility]) 1.  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 - Right breast biopsy at 11:30 position on 07/03/2018 shows invasive ductal carcinoma, ER/PR negative, HER-2 positive, Ki-67 of 40%, right axillary lymph node biopsy shows benign tissue, left breast core needle biopsy at 230 o'clock position shows complex sclerosing lesion - CT chest abdomen and pelvis on 07/16/2018 showed a 6 mm nodule in the posterior left lower lobe with no other evidence of metastatic disease. -Bone scan on 07/16/2018 shows focal area of increased activity over the distal left clavicle/shoulder consistent with degenerative changes. - Skin punch biopsy of the right breast on 07/28/2018 was positive for dermal lymphatic invasion. -Echo on 07/27/2018 shows ejection fraction of 55 to 60%. -She was started on weekly paclitaxel and  Herceptin on 07/31/2018. - She completed week 8 of Herceptin and week 7 of Taxol on 09/18/2018. - Week 9 Taxol was cut back to 45 mg/m on 10/02/2018.  She tolerated it very well. - Week 12 of paclitaxel completed on 10/30/2018. -Physical exam today revealed erythema in the upper outer quadrant of the right breast which is stable.  No lymphadenopathy palpable.  There is mild erythema in the right axillary region.  This is also stable.  Vague mass in the right breast behind the retroareolar area is freely mobile.   - PET CT scan on 11/30/2018 shows small to medium sized focus of increased uptake within the right breast compatible with residual hypermetabolic tumor.  No specific  findings identified to suggest FDG avid metastatic disease.  On the nondiagnostic CT portion of the exam there are several nodules identified in both lungs which are too small to reliably characterized by PET/CT.  These were not confidently identified on CT with contrast on 07/16/2018.  CT of the chest with contrast to confirm its presence was recommended.  Extensive sigmoid diverticulosis.  Multiple foci of intense uptake identified within the sigmoid colon. -2D echo on 11/16/2018 shows LVEF of 60 to 65%. - CT of the chest with contrast dated 12/24/2018 shows response to therapy of the right breast primary and axillary adenopathy.  6 mm left lower lobe pulmonary nodule on the prior exam has resolved.  There are multiple subpleural pulmonary nodules which are new. - Physical examination today shows erythematous rash in the right posterior ribs which is new.  Right breast mass and erythema over the breast is stable. - Evaluation of her blood work showed elevated AST and ALT in the range of 400-450.  She denies taking any new medications including Tylenol.  She does not have any history of gallbladder stones.  Abdominal examination was benign.  No palpable hepatomegaly. - Herceptin by itself is very unlikely to cause elevated liver enzymes.  Liver images on CT scan of the chest did not show any abnormalities. - I would hold off on her Herceptin today.  I will repeat her blood work next week and resume her therapy once the liver enzymes come back to normal. -She has a follow-up appointment with Dr. Donne Hazel in April.        Orders placed this encounter:  Orders Placed This Encounter  Procedures  . CBC with Differential  . Comprehensive metabolic panel      Derek Jack, MD [facility] [phone]

## 2018-12-29 NOTE — Assessment & Plan Note (Signed)
1.  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 - Right breast biopsy at 11:30 position on 07/03/2018 shows invasive ductal carcinoma, ER/PR negative, HER-2 positive, Ki-67 of 40%, right axillary lymph node biopsy shows benign tissue, left breast core needle biopsy at 230 o'clock position shows complex sclerosing lesion - CT chest abdomen and pelvis on 07/16/2018 showed a 6 mm nodule in the posterior left lower lobe with no other evidence of metastatic disease. -Bone scan on 07/16/2018 shows focal area of increased activity over the distal left clavicle/shoulder consistent with degenerative changes. - Skin punch biopsy of the right breast on 07/28/2018 was positive for dermal lymphatic invasion. -Echo on 07/27/2018 shows ejection fraction of 55 to 60%. -She was started on weekly paclitaxel and Herceptin on 07/31/2018. - She completed week 8 of Herceptin and week 7 of Taxol on 09/18/2018. - Week 9 Taxol was cut back to 45 mg/m on 10/02/2018.  She tolerated it very well. - Week 12 of paclitaxel completed on 10/30/2018. -Physical exam today revealed erythema in the upper outer quadrant of the right breast which is stable.  No lymphadenopathy palpable.  There is mild erythema in the right axillary region.  This is also stable.  Vague mass in the right breast behind the retroareolar area is freely mobile.   - PET CT scan on 11/30/2018 shows small to medium sized focus of increased uptake within the right breast compatible with residual hypermetabolic tumor.  No specific findings identified to  suggest FDG avid metastatic disease.  On the nondiagnostic CT portion of the exam there are several nodules identified in both lungs which are too small to reliably characterized by PET/CT.  These were not confidently identified on CT with contrast on 07/16/2018.  CT of the chest with contrast to confirm its presence was recommended.  Extensive sigmoid diverticulosis.  Multiple foci of intense uptake identified within the sigmoid colon. -2D echo on 11/16/2018 shows LVEF of 60 to 65%. - CT of the chest with contrast dated 12/24/2018 shows response to therapy of the right breast primary and axillary adenopathy.  6 mm left lower lobe pulmonary nodule on the prior exam has resolved.  There are multiple subpleural pulmonary nodules which are new. - Physical examination today shows erythematous rash in the right posterior ribs which is new.  Right breast mass and erythema over the breast is stable. - Evaluation of her blood work showed elevated AST and ALT in the range of 400-450.  She denies taking any new medications including Tylenol.  She does not have any history of gallbladder stones.  Abdominal examination was benign.  No palpable hepatomegaly. - Herceptin by itself is very unlikely to cause elevated liver enzymes.  Liver images on CT scan of the chest did not show any abnormalities. - I would hold off on her Herceptin today.  I will repeat her blood work next week and resume her therapy once the liver enzymes come back to normal. -She has a follow-up appointment with Dr. WDonne Hazelin April.

## 2019-01-01 DIAGNOSIS — I890 Lymphedema, not elsewhere classified: Secondary | ICD-10-CM | POA: Diagnosis not present

## 2019-01-04 ENCOUNTER — Inpatient Hospital Stay (HOSPITAL_COMMUNITY): Payer: Medicare HMO

## 2019-01-04 ENCOUNTER — Inpatient Hospital Stay (HOSPITAL_COMMUNITY): Payer: Medicare HMO | Admitting: Hematology

## 2019-01-04 ENCOUNTER — Encounter (HOSPITAL_COMMUNITY): Payer: Self-pay | Admitting: Hematology

## 2019-01-04 ENCOUNTER — Other Ambulatory Visit: Payer: Self-pay

## 2019-01-04 VITALS — BP 140/80 | HR 93 | Temp 98.2°F | Resp 14 | Wt 178.6 lb

## 2019-01-04 VITALS — BP 121/61 | HR 93 | Temp 97.6°F | Resp 18

## 2019-01-04 DIAGNOSIS — Z171 Estrogen receptor negative status [ER-]: Secondary | ICD-10-CM

## 2019-01-04 DIAGNOSIS — C50411 Malignant neoplasm of upper-outer quadrant of right female breast: Secondary | ICD-10-CM

## 2019-01-04 DIAGNOSIS — Z5112 Encounter for antineoplastic immunotherapy: Secondary | ICD-10-CM | POA: Diagnosis not present

## 2019-01-04 DIAGNOSIS — E785 Hyperlipidemia, unspecified: Secondary | ICD-10-CM

## 2019-01-04 DIAGNOSIS — I10 Essential (primary) hypertension: Secondary | ICD-10-CM

## 2019-01-04 DIAGNOSIS — R740 Nonspecific elevation of levels of transaminase and lactic acid dehydrogenase [LDH]: Secondary | ICD-10-CM

## 2019-01-04 DIAGNOSIS — Z9221 Personal history of antineoplastic chemotherapy: Secondary | ICD-10-CM

## 2019-01-04 DIAGNOSIS — K219 Gastro-esophageal reflux disease without esophagitis: Secondary | ICD-10-CM

## 2019-01-04 DIAGNOSIS — Z7982 Long term (current) use of aspirin: Secondary | ICD-10-CM

## 2019-01-04 DIAGNOSIS — R918 Other nonspecific abnormal finding of lung field: Secondary | ICD-10-CM

## 2019-01-04 DIAGNOSIS — Z79899 Other long term (current) drug therapy: Secondary | ICD-10-CM

## 2019-01-04 DIAGNOSIS — L539 Erythematous condition, unspecified: Secondary | ICD-10-CM | POA: Diagnosis not present

## 2019-01-04 LAB — COMPREHENSIVE METABOLIC PANEL
ALT: 349 U/L — AB (ref 0–44)
AST: 273 U/L — ABNORMAL HIGH (ref 15–41)
Albumin: 3.3 g/dL — ABNORMAL LOW (ref 3.5–5.0)
Alkaline Phosphatase: 126 U/L (ref 38–126)
Anion gap: 9 (ref 5–15)
BUN: 18 mg/dL (ref 8–23)
CALCIUM: 9 mg/dL (ref 8.9–10.3)
CO2: 22 mmol/L (ref 22–32)
CREATININE: 0.93 mg/dL (ref 0.44–1.00)
Chloride: 101 mmol/L (ref 98–111)
GFR calc Af Amer: >60 mL/min (ref >60)
GFR calc non Af Amer: 56 mL/min — ABNORMAL LOW (ref >60)
Glucose, Bld: 110 mg/dL — ABNORMAL HIGH (ref 70–99)
Potassium: 3.7 mmol/L (ref 3.5–5.1)
Sodium: 132 mmol/L — ABNORMAL LOW (ref 135–145)
Total Bilirubin: 0.4 mg/dL (ref 0.3–1.2)
Total Protein: 6.6 g/dL (ref 6.5–8.1)

## 2019-01-04 LAB — CBC WITH DIFFERENTIAL/PLATELET
Abs Immature Granulocytes: 0.03 10*3/uL (ref 0.00–0.07)
Basophils Absolute: 0.1 10*3/uL (ref 0.0–0.1)
Basophils Relative: 1 %
Eosinophils Absolute: 0.5 10*3/uL (ref 0.0–0.5)
Eosinophils Relative: 5 %
HCT: 34.6 % — ABNORMAL LOW (ref 36.0–46.0)
Hemoglobin: 10.9 g/dL — ABNORMAL LOW (ref 12.0–15.0)
Immature Granulocytes: 0 %
LYMPHS PCT: 38 %
Lymphs Abs: 3.5 10*3/uL (ref 0.7–4.0)
MCH: 27.3 pg (ref 26.0–34.0)
MCHC: 31.5 g/dL (ref 30.0–36.0)
MCV: 86.5 fL (ref 80.0–100.0)
MONOS PCT: 12 %
Monocytes Absolute: 1.1 10*3/uL — ABNORMAL HIGH (ref 0.1–1.0)
Neutro Abs: 4 10*3/uL (ref 1.7–7.7)
Neutrophils Relative %: 44 %
Platelets: 395 10*3/uL (ref 150–400)
RBC: 4 MIL/uL (ref 3.87–5.11)
RDW: 15.3 % (ref 11.5–15.5)
WBC: 9.3 10*3/uL (ref 4.0–10.5)
nRBC: 0 % (ref 0.0–0.2)

## 2019-01-04 MED ORDER — HEPARIN SOD (PORK) LOCK FLUSH 100 UNIT/ML IV SOLN
500.0000 [IU] | Freq: Once | INTRAVENOUS | Status: AC | PRN
  Administered 2019-01-04: 500 [IU]

## 2019-01-04 MED ORDER — DIPHENHYDRAMINE HCL 25 MG PO CAPS
ORAL_CAPSULE | ORAL | Status: AC
  Filled 2019-01-04: qty 2

## 2019-01-04 MED ORDER — SODIUM CHLORIDE 0.9 % IV SOLN
Freq: Once | INTRAVENOUS | Status: AC
  Administered 2019-01-04: 10:00:00 via INTRAVENOUS

## 2019-01-04 MED ORDER — SODIUM CHLORIDE 0.9% FLUSH
10.0000 mL | INTRAVENOUS | Status: DC | PRN
  Administered 2019-01-04 (×2): 10 mL
  Filled 2019-01-04 (×2): qty 10

## 2019-01-04 MED ORDER — ACETAMINOPHEN 325 MG PO TABS
ORAL_TABLET | ORAL | Status: AC
  Filled 2019-01-04: qty 2

## 2019-01-04 MED ORDER — TRASTUZUMAB CHEMO 150 MG IV SOLR
6.0000 mg/kg | Freq: Once | INTRAVENOUS | Status: AC
  Administered 2019-01-04: 504 mg via INTRAVENOUS
  Filled 2019-01-04: qty 24

## 2019-01-04 MED ORDER — DIPHENHYDRAMINE HCL 25 MG PO CAPS
25.0000 mg | ORAL_CAPSULE | Freq: Once | ORAL | Status: AC
  Administered 2019-01-04: 25 mg via ORAL

## 2019-01-04 MED ORDER — ACETAMINOPHEN 325 MG PO TABS
650.0000 mg | ORAL_TABLET | Freq: Once | ORAL | Status: AC
  Administered 2019-01-04: 650 mg via ORAL

## 2019-01-04 NOTE — Progress Notes (Signed)
[place], [place] [address]   CLINIC:  Medical Oncology/Hematology  PCP:  [name], MD [place] [address] [phone]   REASON FOR VISIT: Follow-up for right breast cancer, ER-/PR-/HER2+  CURRENT THERAPY:Herceptin every 3 weeks.  BRIEF ONCOLOGIC HISTORY:    Malignant neoplasm of upper-outer quadrant of right breast in female, estrogen receptor negative ([facility])   07/03/2018 Initial Diagnosis    Palpable right breast mass for several months with skin discoloration, clinically skin trabecular thickening with nipple retraction measuring 7.2 x 4.3 x 4.2 cm, additional mass 4 o'clock position 2.6 cm, right axillary tail 1.2 cm right axillary lymph node 1.8 cm left breast irregular mass 1 cm, adjacent mass 0.9 cm together measuring 1.9 cm, no lymphadenopathy    07/03/2018 Pathology Results    Right breast biopsy: IDC grade 2, ER 0%, PR 0%, HER-2 positive, Ki-67 40%, lymph node biopsy negative, left breast biopsy complex sclerosing lesion     07/15/2018 Cancer Staging    Staging form: Breast, AJCC 8th Edition - Clinical: Stage IIB (cT3, cN0, cM0, G2, ER-, PR-, HER2+) - Signed by [name], MD on 07/15/2018    07/31/2018 - 10/30/2018 Chemotherapy    The patient had trastuzumab (HERCEPTIN) 336 mg in sodium chloride 0.9 % 250 mL chemo infusion, 4 mg/kg = 336 mg, Intravenous,  Once, 4 of 4 cycles Administration: 336 mg (07/31/2018), 168 mg (08/07/2018), 168 mg (08/28/2018), 168 mg (08/14/2018), 168 mg (08/21/2018), 168 mg (09/04/2018), 168 mg (09/11/2018), 168 mg (09/18/2018), 168 mg (10/02/2018), 168 mg (10/09/2018), 168 mg (10/16/2018), 168 mg (10/23/2018), 168 mg (10/30/2018) PACLitaxel (TAXOL) 78 mg in sodium chloride 0.9 % 250 mL chemo infusion (</= 69m/m2), 40 mg/m2 = 78 mg (50 % of original dose 80 mg/m2), Intravenous,  Once, 4 of 4 cycles Dose modification: 40 mg/m2 (50 % of original dose 80 mg/m2, Cycle 1, Reason: Patient Age),  53.3333 mg/m2 (66.7 % of original dose 80 mg/m2, Cycle 1, Reason: Provider Judgment), 45 mg/m2 (original dose 80 mg/m2, Cycle 3, Reason: Provider Judgment) Administration: 78 mg (07/31/2018), 102 mg (08/07/2018), 102 mg (08/28/2018), 102 mg (08/14/2018), 102 mg (08/21/2018), 102 mg (09/04/2018), 102 mg (09/18/2018), 84 mg (10/02/2018), 84 mg (10/09/2018), 84 mg (10/16/2018), 84 mg (10/23/2018), 84 mg (10/30/2018)  for chemotherapy treatment.     11/13/2018 -  Chemotherapy    The patient had trastuzumab (HERCEPTIN) 504 mg in sodium chloride 0.9 % 250 mL chemo infusion, 6 mg/kg = 504 mg (100 % of original dose 6 mg/kg), Intravenous,  Once, 3 of 5 cycles Dose modification: 6 mg/kg (original dose 6 mg/kg, Cycle 1, Reason: Other (see comments), Comment: pt receving it ) Administration: 504 mg (11/13/2018), 504 mg (12/04/2018), 504 mg (01/04/2019)  for chemotherapy treatment.       CANCER STAGING: Cancer Staging Malignant neoplasm of upper-outer quadrant of right breast in female, estrogen receptor negative ([facility] Staging form: Breast, AJCC 8th Edition - Clinical: Stage IIB (cT3, cN0, cM0, G2, ER-, PR-, HER2+) - Signed by GNicholas Lose MD on 07/15/2018    INTERVAL HISTORY:  Ms. [id number].[id number]. female returns for routine follow-up for right breast cancer. She is here today with her husband. She does experience fatigue during the day. Her redness of her right breast is stable at this time. She has no other complaints and is ready for treatment. Denies any nausea, vomiting, or diarrhea. Denies any new pains. Had not noticed any recent bleeding such  as epistaxis, hematuria or hematochezia. Denies recent chest pain on exertion, shortness of breath on minimal exertion, pre-syncopal episodes, or palpitations. Denies any numbness or tingling in hands or feet. Denies any recent fevers, infections, or recent hospitalizations. Patient reports appetite at 100% and energy level at 50%. She is eating well and maintaining her  weight at this time.     REVIEW OF SYSTEMS:  Review of Systems  Constitutional: Positive for fatigue.  All other systems reviewed and are negative.    PAST MEDICAL/SURGICAL HISTORY:  Past Medical History:  Diagnosis Date  . Cancer ([facility]) 06/2018   right breast cancer  . GERD (gastroesophageal reflux disease)    occasional   . Hyperlipidemia   . Hypertension    clearance with note Dr Nevada Crane on chart  . Pneumonia    2 years ago/ states occ cough with sinus drainage- no fever  . Thyroid nodule    with biopsy- states following medically   Past Surgical History:  Procedure Laterality Date  . CATARACT EXTRACTION W/PHACO  11/30/2012   Procedure: CATARACT EXTRACTION PHACO AND INTRAOCULAR LENS PLACEMENT (IOC);  Surgeon: Williams Che, MD;  Location: [facility];  Service: Ophthalmology;  Laterality: Left;  CDE:11.49  . CATARACT EXTRACTION W/PHACO Right 03/15/2013   Procedure: CATARACT EXTRACTION PHACO AND INTRAOCULAR LENS PLACEMENT (IOC);  Surgeon: Williams Che, MD;  Location: [facility];  Service: Ophthalmology;  Laterality: Right;  CDE 16.15  . COLONOSCOPY    . EXCISION OF SKIN TAG Right 06/17/2016   Procedure: SHAVE OF SKIN TAG RIGHT BUTTOCK;  Surgeon: Aviva Signs, MD;  Location: [facility];  Service: General;  Laterality: Right;  . JOINT REPLACEMENT     left knee  . MASS EXCISION Left 06/17/2016   Procedure: EXCISION SKIN MALIGNANT LESION LEFT BUTTOCK;  Surgeon: Aviva Signs, MD;  Location: [facility];  Service: General;  Laterality: Left;  . PORTACATH PLACEMENT Right 07/28/2018   Procedure: INSERTION PORT-A-CATH WITH ULTRASOUND;  Surgeon: Rolm Bookbinder, MD;  Location: [facility];  Service: General;  Laterality: Right;  . PUNCH BIOPSY OF SKIN Right 07/28/2018   Procedure: PUNCH BIOPSY OF SKIN RIGHT BREAST;  Surgeon: Rolm Bookbinder, MD;  Location: [facility];  Service: General;  Laterality: Right;  . TOTAL KNEE ARTHROPLASTY  12/16/2011   Procedure: TOTAL KNEE  ARTHROPLASTY;  Surgeon: Gearlean Alf, MD;  Location: [facility];  Service: Orthopedics;  Laterality: Right;  . TUBAL LIGATION       SOCIAL HISTORY:  Social History   Socioeconomic History  . Marital status: Married    Spouse name: Not on file  . Number of children: 2  . Years of education: some business school after HS  . Highest education level: Not on file  Occupational History  . Occupation: Cabin crew  . Occupation: Network engineer   Social Needs  . Financial resource strain: Not hard at all  . Food insecurity:    Worry: Never true    Inability: Never true  . Transportation needs:    Medical: No    Non-medical: No  Tobacco Use  . Smoking status: Never Smoker  . Smokeless tobacco: Never Used  Substance and Sexual Activity  . Alcohol use: No    Frequency: Never  . Drug use: No  . Sexual activity: Yes    Birth control/protection: None  Lifestyle  . Physical activity:    Days per week: 0 days    Minutes per session: 0 min  . Stress: Not at  all  Relationships  . Social connections:    Talks on phone: More than three times a week    Gets together: More than three times a week    Attends religious service: More than 4 times per year    Active member of club or organization: Yes    Attends meetings of clubs or organizations: Never    Relationship status: Married  . Intimate partner violence:    Fear of current or ex partner: No    Emotionally abused: No    Physically abused: No    Forced sexual activity: No  Other Topics Concern  . Not on file  Social History Narrative   Lives at home with her husband.   4 cups caffeine per day.   Right-handed.    FAMILY HISTORY:  Family History  Problem Relation Age of Onset  . Heart attack Mother   . Bronchitis Father   . Diabetes Sister   . Leukemia Brother   . Lung disease Brother   . Lung disease Sister     CURRENT MEDICATIONS:  Outpatient Encounter Medications as of 01/04/2019  Medication Sig  . acetaminophen (TYLENOL)  325 MG tablet Take 650 mg by mouth every 6 (six) hours as needed.  Marland Kitchen aspirin 325 MG tablet Take 162.5 mg by mouth daily.   . cetirizine (ZYRTEC) 10 MG tablet Take 10 mg by mouth daily.  . hydrocortisone 1 % ointment Apply 1 application topically 2 (two) times daily.  Marland Kitchen lidocaine-prilocaine (EMLA) cream Apply to port site one hour prior to appointment and cover with plastic wrap.  . Misc. Devices MISC Please provide lymphedema sleeve for right arm  . Multiple Vitamin (MULTIVITAMIN WITH MINERALS) TABS tablet Take 1 tablet by mouth daily.  . Omega-3 Fatty Acids (FISH OIL PO) Take 1 capsule by mouth daily.   Marland Kitchen PACLitaxel (TAXOL IV) Inject into the vein once a week.  . prochlorperazine (COMPAZINE) 10 MG tablet Take 1 tablet (10 mg total) by mouth every 6 (six) hours as needed (Nausea or vomiting).  . Trastuzumab (HERCEPTIN IV) Inject into the vein once a week.   Marland Kitchen UNABLE TO FIND Take 1 capsule by mouth daily. Med Name: Tumeric Curcumin  . [DISCONTINUED] diphenhydrAMINE (BENADRYL) 25 MG tablet Take 25 mg by mouth at bedtime as needed.  . [DISCONTINUED] pantoprazole (PROTONIX) 40 MG tablet Take 40 mg by mouth daily.  . [DISCONTINUED] simvastatin (ZOCOR) 10 MG tablet Take 10 mg by mouth daily.   No facility-administered encounter medications on file as of 01/04/2019.     ALLERGIES:  Allergies  Allergen Reactions  . Penicillins Rash    Has patient had a PCN reaction causing immediate rash, facial/tongue/throat swelling, SOB or lightheadedness with hypotension: No Has patient had a PCN reaction causing severe rash involving mucus membranes or skin necrosis: No Has patient had a PCN reaction that required hospitalization: No Has patient had a PCN reaction occurring within the last 10 years: No If all of the above answers are "NO", then may proceed with Cephalosporin use.      PHYSICAL EXAM:  ECOG Performance status: 1  Vitals:   01/04/19 0903  BP: 140/80  Pulse: 93  Resp: 14  Temp: 98.2 F  (36.8 C)  SpO2: 100%   Filed Weights   01/04/19 0903  Weight: 178 lb 9.6 oz (81 kg)    Physical Exam Constitutional:      Appearance: Normal appearance. She is normal weight.  Cardiovascular:     Rate  and Rhythm: Normal rate and regular rhythm.     Heart sounds: Normal heart sounds.  Pulmonary:     Effort: Pulmonary effort is normal.     Breath sounds: Normal breath sounds.  Musculoskeletal: Normal range of motion.  Skin:    General: Skin is warm and dry.  Neurological:     Mental Status: She is alert and oriented to person, place, and time. Mental status is at baseline.  Psychiatric:        Mood and Affect: Mood normal.        Behavior: Behavior normal.        Thought Content: Thought content normal.        Judgment: Judgment normal.      LABORATORY DATA:  I have reviewed the labs as listed.  CBC    Component Value Date/Time   WBC 9.3 01/04/2019 0800   RBC 4.00 01/04/2019 0800   HGB 10.9 (L) 01/04/2019 0800   HGB 12.7 11/24/2017 1626   HCT 34.6 (L) 01/04/2019 0800   HCT 36.6 11/24/2017 1626   PLT 395 01/04/2019 0800   PLT 420 (H) 11/24/2017 1626   MCV 86.5 01/04/2019 0800   MCV 88 11/24/2017 1626   MCH 27.3 01/04/2019 0800   MCHC 31.5 01/04/2019 0800   RDW 15.3 01/04/2019 0800   RDW 15.4 11/24/2017 1626   LYMPHSABS 3.5 01/04/2019 0800   LYMPHSABS 2.5 11/24/2017 1626   MONOABS 1.1 (H) 01/04/2019 0800   EOSABS 0.5 01/04/2019 0800   EOSABS 0.1 11/24/2017 1626   BASOSABS 0.1 01/04/2019 0800   BASOSABS 0.0 11/24/2017 1626   CMP Latest Ref Rng & Units 01/04/2019 12/29/2018 12/04/2018  Glucose 70 - 99 mg/dL 110(H) 114(H) 116(H)  BUN 8 - 23 mg/dL _0 Creatinine 0.44 - 1.00 mg/dL 0.93 1.04(H) 1.05(H)  Sodium 135 - 145 mmol/L 132(L) 134(L) 132(L)  Potassium 3.5 - 5.1 mmol/L 3.7 4.4 3.9  Chloride 98 - 111 mmol/L 101 100 98  CO2 22 - 32 mmol/L _1 Calcium 8.9 - 10.3 mg/dL 9.0 9.3 9.0  Total Protein 6.5 - 8.1 g/dL 6.6 6.8 6.8  Total Bilirubin 0.3 - 1.2  mg/dL 0.4 0.4 0.3  Alkaline Phos 38 - 126 U/L 126 115 87  AST 15 - 41 U/L 273(H) 405(H) 27  ALT 0 - 44 U/L 349(H) 457(H) 20       DIAGNOSTIC IMAGING:  I have independently reviewed the scans and discussed with the patient.   I have reviewed Francene Finders, NP's note and agree with the documentation.  I personally performed a face-to-face visit, made revisions and my assessment and plan is as follows.    ASSESSMENT & PLAN:   Malignant neoplasm of upper-outer quadrant of right breast in female, estrogen receptor negative ([facility]) 1.  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 - Right breast biopsy at 11:30 position on 07/03/2018 shows invasive ductal carcinoma, ER/PR negative, HER-2 positive, Ki-67 of 40%, right axillary lymph node biopsy shows benign tissue, left breast core needle biopsy at 230 o'clock position shows complex sclerosing lesion -  CT chest abdomen and pelvis on 07/16/2018 showed a 6 mm nodule in the posterior left lower lobe with no other evidence of metastatic disease. -Bone scan on 07/16/2018 shows focal area of increased activity over the distal left clavicle/shoulder consistent with degenerative changes. - Skin punch biopsy of the right breast on 07/28/2018 was positive for dermal lymphatic invasion. -Echo on 07/27/2018 shows ejection fraction of 55 to 60%. -She was started on weekly paclitaxel and Herceptin on 07/31/2018. - She completed week 8 of Herceptin and week 7 of Taxol on 09/18/2018. - Week 9 Taxol was cut back to 45 mg/m on 10/02/2018.  She  tolerated it very well. - Week 12 of paclitaxel completed on 10/30/2018. -Physical exam today revealed erythema in the upper outer quadrant of the right breast which is stable.  No lymphadenopathy palpable.  There is mild erythema in the right axillary region.  This is also stable.  Vague mass in the right breast behind the retroareolar area is freely mobile.   - PET CT scan on 11/30/2018 shows small to medium sized focus of increased uptake within the right breast compatible with residual hypermetabolic tumor.  No specific findings identified to suggest FDG avid metastatic disease.  On the nondiagnostic CT portion of the exam there are several nodules identified in both lungs which are too small to reliably characterized by PET/CT.  These were not confidently identified on CT with contrast on 07/16/2018.  CT of the chest with contrast to confirm its presence was recommended.  Extensive sigmoid diverticulosis.  Multiple foci of intense uptake identified within the sigmoid colon. -Echocardiogram on 11/16/2018 shows LVEF of 60 to 65%.  - CT of the chest with contrast dated 12/24/2018 shows response to therapy of the right breast primary and axillary adenopathy.  6 mm left lower lobe pulmonary nodule on the prior exam has resolved.  There are multiple subpleural pulmonary nodules which are new. - Physical exam shows erythematous rash in the right posterior ribs which is stable.  Right breast mass and erythema of the breast are stable. - Last week her AST and ALT were elevated in the range of 400.  She denied taking any new medications. -I have held her Herceptin last week.  I have repeated LFTs today.  She denies any right upper quadrant pain.  LFTs improved to the 200 range. -She may proceed with her Herceptin today.  I will check her blood weekly for LFTs. -I will see her back in 3 weeks for follow-up.  -She has a follow-up appointment with Dr. Donne Hazel in April.        Orders placed this encounter:    Orders Placed This Encounter  Procedures  . Hepatic function panel      Derek Jack, MD [place] [phone]

## 2019-01-04 NOTE — Patient Instructions (Signed)
[facility]  Discharge Instructions:  You saw Dr. Katragadda today. _______________________________________________________________  Thank you for choosing [facility] at [facility] to provide your oncology and hematology care.  To afford each patient quality time with our providers, please arrive at least 15 minutes before your scheduled appointment.  You need to re-schedule your appointment if you arrive 10 or more minutes late.  We strive to give you quality time with our providers, and arriving late affects you and other patients whose appointments are after yours.  Also, if you no show three or more times for appointments you may be dismissed from the clinic.  Again, thank you for choosing [facility] at [facility]. Our hope is that these requests will allow you access to exceptional care and in a timely manner. _______________________________________________________________  If you have questions after your visit, please contact our office at [phone] between the hours of 8:30 a.m. and 5:00 p.m. Voicemails left after 4:30 p.m. will not be returned until the following business day. _______________________________________________________________  For prescription refill requests, have your pharmacy contact our office. _______________________________________________________________  Recommendations made by the consultant and any test results will be sent to your referring physician. _______________________________________________________________ 

## 2019-01-04 NOTE — Patient Instructions (Signed)
[facility] Discharge Instructions for Patients Receiving Chemotherapy   Beginning January 23rd 2017 lab work for the Cancer Center will be done in the  Main lab at [facility] on 1st floor. If you have a lab appointment with the Cancer Center please come in thru the  Main Entrance and check in at the main information desk   Today you received the following chemotherapy agents Herceptin. Follow-up as scheduled. Call clinic for any questions or concerns  To help prevent nausea and vomiting after your treatment, we encourage you to take your nausea medication.   If you develop nausea and vomiting, or diarrhea that is not controlled by your medication, call the clinic.  The clinic phone number is [phone]. Office hours are Monday-Friday 8:30am-5:00pm.  BELOW ARE SYMPTOMS THAT SHOULD BE REPORTED IMMEDIATELY:  *FEVER GREATER THAN 101.0 F  *CHILLS WITH OR WITHOUT FEVER  NAUSEA AND VOMITING THAT IS NOT CONTROLLED WITH YOUR NAUSEA MEDICATION  *UNUSUAL SHORTNESS OF BREATH  *UNUSUAL BRUISING OR BLEEDING  TENDERNESS IN MOUTH AND THROAT WITH OR WITHOUT PRESENCE OF ULCERS  *URINARY PROBLEMS  *BOWEL PROBLEMS  UNUSUAL RASH Items with * indicate a potential emergency and should be followed up as soon as possible. If you have an emergency after office hours please contact your primary care physician or go to the nearest emergency department.  Please call the clinic during office hours if you have any questions or concerns.   You may also contact the Patient Navigator at [phone] should you have any questions or need assistance in obtaining follow up care.      Resources For Cancer Patients and their Caregivers ? American Cancer Society: Can assist with transportation, wigs, general needs, runs Look Good Feel Better.        1-888-227-6333 ? Cancer Care: Provides financial assistance, online support groups, medication/co-pay assistance.  1-800-813-HOPE  (4673) ? [facility] Assists [place] cancer patients and their families through emotional , educational and financial support.  336-427-4357 ? [place] DSS Where to apply for food stamps, Medicaid and utility assistance. 336-342-1394 ? [facility]: Transportation to medical appointments. 336-347-2287 ? Social Security Administration: May apply for disability if have a Stage IV cancer. 336-342-7796 1-800-772-1213 ? [place] Aging, Disability and Transit Services: Assists with nutrition, care and transit needs. 336-349-2343         

## 2019-01-04 NOTE — Assessment & Plan Note (Signed)
1.  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 - Right breast biopsy at 11:30 position on 07/03/2018 shows invasive ductal carcinoma, ER/PR negative, HER-2 positive, Ki-67 of 40%, right axillary lymph node biopsy shows benign tissue, left breast core needle biopsy at 230 o'clock position shows complex sclerosing lesion - CT chest abdomen and pelvis on 07/16/2018 showed a 6 mm nodule in the posterior left lower lobe with no other evidence of metastatic disease. -Bone scan on 07/16/2018 shows focal area of increased activity over the distal left clavicle/shoulder consistent with degenerative changes. - Skin punch biopsy of the right breast on 07/28/2018 was positive for dermal lymphatic invasion. -Echo on 07/27/2018 shows ejection fraction of 55 to 60%. -She was started on weekly paclitaxel and Herceptin on 07/31/2018. - She completed week 8 of Herceptin and week 7 of Taxol on 09/18/2018. - Week 9 Taxol was cut back to 45 mg/m on 10/02/2018.  She tolerated it very well. - Week 12 of paclitaxel completed on 10/30/2018. -Physical exam today revealed erythema in the upper outer quadrant of the right breast which is stable.  No lymphadenopathy palpable.  There is mild erythema in the right axillary region.  This is also stable.  Vague mass in the right breast behind the retroareolar area is freely mobile.   - PET CT scan on 11/30/2018 shows small to medium sized focus of increased uptake within the right breast compatible with residual hypermetabolic tumor.  No specific findings identified to  suggest FDG avid metastatic disease.  On the nondiagnostic CT portion of the exam there are several nodules identified in both lungs which are too small to reliably characterized by PET/CT.  These were not confidently identified on CT with contrast on 07/16/2018.  CT of the chest with contrast to confirm its presence was recommended.  Extensive sigmoid diverticulosis.  Multiple foci of intense uptake identified within the sigmoid colon. -Echocardiogram on 11/16/2018 shows LVEF of 60 to 65%.  - CT of the chest with contrast dated 12/24/2018 shows response to therapy of the right breast primary and axillary adenopathy.  6 mm left lower lobe pulmonary nodule on the prior exam has resolved.  There are multiple subpleural pulmonary nodules which are new. - Physical exam shows erythematous rash in the right posterior ribs which is stable.  Right breast mass and erythema of the breast are stable. - Last week her AST and ALT were elevated in the range of 400.  She denied taking any new medications. -I have held her Herceptin last week.  I have repeated LFTs today.  She denies any right upper quadrant pain.  LFTs improved to the 200 range. -She may proceed with her Herceptin today.  I will check her blood weekly for LFTs. -I will see her back in 3 weeks for follow-up.  -She has a follow-up appointment with Dr. WDonne Hazelin April.

## 2019-01-04 NOTE — Progress Notes (Signed)
0950 Labs reviewed with and pt seen by Dr. [name] and pt approved for Herceptin infusion today per MD                                                              [name] tolerated Herceptin infusion well without complaints or incident. VSS upon discharge. Pt discharged self ambulatory in satisfactory condition accompanied by her husband

## 2019-01-08 DIAGNOSIS — I890 Lymphedema, not elsewhere classified: Secondary | ICD-10-CM | POA: Diagnosis not present

## 2019-01-11 ENCOUNTER — Other Ambulatory Visit: Payer: Self-pay

## 2019-01-11 ENCOUNTER — Inpatient Hospital Stay (HOSPITAL_COMMUNITY): Payer: Medicare HMO

## 2019-01-11 DIAGNOSIS — K219 Gastro-esophageal reflux disease without esophagitis: Secondary | ICD-10-CM | POA: Diagnosis not present

## 2019-01-11 DIAGNOSIS — C50411 Malignant neoplasm of upper-outer quadrant of right female breast: Secondary | ICD-10-CM | POA: Diagnosis not present

## 2019-01-11 DIAGNOSIS — I890 Lymphedema, not elsewhere classified: Secondary | ICD-10-CM | POA: Diagnosis not present

## 2019-01-11 DIAGNOSIS — I10 Essential (primary) hypertension: Secondary | ICD-10-CM | POA: Diagnosis not present

## 2019-01-11 DIAGNOSIS — Z9221 Personal history of antineoplastic chemotherapy: Secondary | ICD-10-CM | POA: Diagnosis not present

## 2019-01-11 DIAGNOSIS — L539 Erythematous condition, unspecified: Secondary | ICD-10-CM | POA: Diagnosis not present

## 2019-01-11 DIAGNOSIS — Z5112 Encounter for antineoplastic immunotherapy: Secondary | ICD-10-CM | POA: Diagnosis not present

## 2019-01-11 DIAGNOSIS — R740 Nonspecific elevation of levels of transaminase and lactic acid dehydrogenase [LDH]: Secondary | ICD-10-CM | POA: Diagnosis not present

## 2019-01-11 DIAGNOSIS — E785 Hyperlipidemia, unspecified: Secondary | ICD-10-CM | POA: Diagnosis not present

## 2019-01-11 DIAGNOSIS — R918 Other nonspecific abnormal finding of lung field: Secondary | ICD-10-CM | POA: Diagnosis not present

## 2019-01-11 DIAGNOSIS — Z171 Estrogen receptor negative status [ER-]: Secondary | ICD-10-CM | POA: Diagnosis not present

## 2019-01-11 LAB — HEPATIC FUNCTION PANEL
ALT: 300 U/L — ABNORMAL HIGH (ref 0–44)
AST: 266 U/L — ABNORMAL HIGH (ref 15–41)
Albumin: 3.4 g/dL — ABNORMAL LOW (ref 3.5–5.0)
Alkaline Phosphatase: 151 U/L — ABNORMAL HIGH (ref 38–126)
Bilirubin, Direct: 0.1 mg/dL (ref 0.0–0.2)
Indirect Bilirubin: 0.5 mg/dL (ref 0.3–0.9)
TOTAL PROTEIN: 7 g/dL (ref 6.5–8.1)
Total Bilirubin: 0.6 mg/dL (ref 0.3–1.2)

## 2019-01-12 ENCOUNTER — Telehealth (HOSPITAL_COMMUNITY): Payer: Self-pay | Admitting: Surgery

## 2019-01-12 NOTE — Telephone Encounter (Signed)
[name] had left a message needing office notes on [name], specifically that mentioned lymphadema, since [employer] works with compression devices.  I faxed office notes from a January 2020 visit that mentioned the pt had lymphadema to [facility].  Called to confirm receipt of office notes-Jessica received them and said she would call back if they needed any further information.

## 2019-01-14 DIAGNOSIS — I890 Lymphedema, not elsewhere classified: Secondary | ICD-10-CM | POA: Diagnosis not present

## 2019-01-15 DIAGNOSIS — I972 Postmastectomy lymphedema syndrome: Secondary | ICD-10-CM | POA: Diagnosis not present

## 2019-01-18 DIAGNOSIS — I890 Lymphedema, not elsewhere classified: Secondary | ICD-10-CM | POA: Diagnosis not present

## 2019-01-25 ENCOUNTER — Other Ambulatory Visit: Payer: Self-pay

## 2019-01-25 ENCOUNTER — Encounter (HOSPITAL_COMMUNITY): Payer: Self-pay | Admitting: Hematology

## 2019-01-25 ENCOUNTER — Inpatient Hospital Stay (HOSPITAL_COMMUNITY): Payer: Medicare HMO

## 2019-01-25 ENCOUNTER — Inpatient Hospital Stay (HOSPITAL_COMMUNITY): Payer: Medicare HMO | Admitting: Hematology

## 2019-01-25 VITALS — BP 116/70 | HR 85 | Temp 97.6°F | Resp 18

## 2019-01-25 DIAGNOSIS — Z171 Estrogen receptor negative status [ER-]: Principal | ICD-10-CM

## 2019-01-25 DIAGNOSIS — R740 Nonspecific elevation of levels of transaminase and lactic acid dehydrogenase [LDH]: Secondary | ICD-10-CM

## 2019-01-25 DIAGNOSIS — Z79899 Other long term (current) drug therapy: Secondary | ICD-10-CM

## 2019-01-25 DIAGNOSIS — L539 Erythematous condition, unspecified: Secondary | ICD-10-CM

## 2019-01-25 DIAGNOSIS — R918 Other nonspecific abnormal finding of lung field: Secondary | ICD-10-CM

## 2019-01-25 DIAGNOSIS — C50411 Malignant neoplasm of upper-outer quadrant of right female breast: Secondary | ICD-10-CM

## 2019-01-25 DIAGNOSIS — K219 Gastro-esophageal reflux disease without esophagitis: Secondary | ICD-10-CM | POA: Diagnosis not present

## 2019-01-25 DIAGNOSIS — Z5112 Encounter for antineoplastic immunotherapy: Secondary | ICD-10-CM | POA: Diagnosis not present

## 2019-01-25 DIAGNOSIS — I10 Essential (primary) hypertension: Secondary | ICD-10-CM

## 2019-01-25 DIAGNOSIS — Z9221 Personal history of antineoplastic chemotherapy: Secondary | ICD-10-CM | POA: Diagnosis not present

## 2019-01-25 DIAGNOSIS — Z7982 Long term (current) use of aspirin: Secondary | ICD-10-CM

## 2019-01-25 DIAGNOSIS — E785 Hyperlipidemia, unspecified: Secondary | ICD-10-CM

## 2019-01-25 LAB — COMPREHENSIVE METABOLIC PANEL
ALT: 274 U/L — ABNORMAL HIGH (ref 0–44)
AST: 272 U/L — ABNORMAL HIGH (ref 15–41)
Albumin: 3.3 g/dL — ABNORMAL LOW (ref 3.5–5.0)
Alkaline Phosphatase: 167 U/L — ABNORMAL HIGH (ref 38–126)
Anion gap: 8 (ref 5–15)
BUN: 15 mg/dL (ref 8–23)
CHLORIDE: 100 mmol/L (ref 98–111)
CO2: 23 mmol/L (ref 22–32)
Calcium: 8.9 mg/dL (ref 8.9–10.3)
Creatinine, Ser: 0.96 mg/dL (ref 0.44–1.00)
GFR calc Af Amer: >60 mL/min (ref >60)
GFR, EST NON AFRICAN AMERICAN: 54 mL/min — AB (ref >60)
Glucose, Bld: 114 mg/dL — ABNORMAL HIGH (ref 70–99)
Potassium: 3.6 mmol/L (ref 3.5–5.1)
Sodium: 131 mmol/L — ABNORMAL LOW (ref 135–145)
Total Bilirubin: 0.7 mg/dL (ref 0.3–1.2)
Total Protein: 6.9 g/dL (ref 6.5–8.1)

## 2019-01-25 LAB — CBC WITH DIFFERENTIAL/PLATELET
Abs Immature Granulocytes: 0.02 10*3/uL (ref 0.00–0.07)
BASOS ABS: 0.1 10*3/uL (ref 0.0–0.1)
Basophils Relative: 1 %
Eosinophils Absolute: 0.3 10*3/uL (ref 0.0–0.5)
Eosinophils Relative: 3 %
HCT: 36.6 % (ref 36.0–46.0)
Hemoglobin: 11.9 g/dL — ABNORMAL LOW (ref 12.0–15.0)
IMMATURE GRANULOCYTES: 0 %
Lymphocytes Relative: 35 %
Lymphs Abs: 3.3 10*3/uL (ref 0.7–4.0)
MCH: 27.3 pg (ref 26.0–34.0)
MCHC: 32.5 g/dL (ref 30.0–36.0)
MCV: 83.9 fL (ref 80.0–100.0)
Monocytes Absolute: 1.1 10*3/uL — ABNORMAL HIGH (ref 0.1–1.0)
Monocytes Relative: 11 %
NRBC: 0 % (ref 0.0–0.2)
Neutro Abs: 4.7 10*3/uL (ref 1.7–7.7)
Neutrophils Relative %: 50 %
Platelets: 387 10*3/uL (ref 150–400)
RBC: 4.36 MIL/uL (ref 3.87–5.11)
RDW: 15.8 % — ABNORMAL HIGH (ref 11.5–15.5)
WBC: 9.5 10*3/uL (ref 4.0–10.5)

## 2019-01-25 MED ORDER — SODIUM CHLORIDE 0.9% FLUSH
10.0000 mL | INTRAVENOUS | Status: DC | PRN
  Administered 2019-01-25: 10 mL
  Filled 2019-01-25: qty 10

## 2019-01-25 MED ORDER — ACETAMINOPHEN 325 MG PO TABS
650.0000 mg | ORAL_TABLET | Freq: Once | ORAL | Status: AC
  Administered 2019-01-25: 650 mg via ORAL
  Filled 2019-01-25: qty 2

## 2019-01-25 MED ORDER — SODIUM CHLORIDE 0.9 % IV SOLN
Freq: Once | INTRAVENOUS | Status: AC
  Administered 2019-01-25: 10:00:00 via INTRAVENOUS

## 2019-01-25 MED ORDER — DIPHENHYDRAMINE HCL 25 MG PO CAPS
25.0000 mg | ORAL_CAPSULE | Freq: Once | ORAL | Status: AC
  Administered 2019-01-25: 25 mg via ORAL
  Filled 2019-01-25: qty 1

## 2019-01-25 MED ORDER — HEPARIN SOD (PORK) LOCK FLUSH 100 UNIT/ML IV SOLN
500.0000 [IU] | Freq: Once | INTRAVENOUS | Status: AC | PRN
  Administered 2019-01-25: 500 [IU]

## 2019-01-25 MED ORDER — TRASTUZUMAB CHEMO 150 MG IV SOLR
6.0000 mg/kg | Freq: Once | INTRAVENOUS | Status: AC
  Administered 2019-01-25: 504 mg via INTRAVENOUS
  Filled 2019-01-25: qty 24

## 2019-01-25 NOTE — Patient Instructions (Signed)
[facility] Discharge Instructions for Patients Receiving Chemotherapy   Beginning January 23rd 2017 lab work for the Cancer Center will be done in the  Main lab at [place] on 1st floor. If you have a lab appointment with the Cancer Center please come in thru the  Main Entrance and check in at the main information desk   Today you received the following chemotherapy agents Herceptin. Follow-up as scheduled. Call clinic for any questions or concerns  To help prevent nausea and vomiting after your treatment, we encourage you to take your nausea medication.   If you develop nausea and vomiting, or diarrhea that is not controlled by your medication, call the clinic.  The clinic phone number is [phone]. Office hours are Monday-Friday 8:30am-5:00pm.  BELOW ARE SYMPTOMS THAT SHOULD BE REPORTED IMMEDIATELY:  *FEVER GREATER THAN 101.0 F  *CHILLS WITH OR WITHOUT FEVER  NAUSEA AND VOMITING THAT IS NOT CONTROLLED WITH YOUR NAUSEA MEDICATION  *UNUSUAL SHORTNESS OF BREATH  *UNUSUAL BRUISING OR BLEEDING  TENDERNESS IN MOUTH AND THROAT WITH OR WITHOUT PRESENCE OF ULCERS  *URINARY PROBLEMS  *BOWEL PROBLEMS  UNUSUAL RASH Items with * indicate a potential emergency and should be followed up as soon as possible. If you have an emergency after office hours please contact your primary care physician or go to the nearest emergency department.  Please call the clinic during office hours if you have any questions or concerns.   You may also contact the Patient Navigator at [phone] should you have any questions or need assistance in obtaining follow up care.      Resources For Cancer Patients and their Caregivers ? American Cancer Society: Can assist with transportation, wigs, general needs, runs Look Good Feel Better.        1-888-227-6333 ? Cancer Care: Provides financial assistance, online support groups, medication/co-pay assistance.  1-800-813-HOPE  (4673) ? [facility] Assists [place] cancer patients and their families through emotional , educational and financial support.  336-427-4357 ? [place] DSS Where to apply for food stamps, Medicaid and utility assistance. 336-342-1394 ? [facility]: Transportation to medical appointments. 336-347-2287 ? Social Security Administration: May apply for disability if have a Stage IV cancer. 336-342-7796 1-800-772-1213 ? [place] Aging, Disability and Transit Services: Assists with nutrition, care and transit needs. 336-349-2343         

## 2019-01-25 NOTE — Patient Instructions (Addendum)
[facility] Discharge Instructions  You were seen today by Dr. Katragadda. He went over your recent lab results. He will see you back in 3 weeks for labs, treatment and follow up.   Thank you for choosing [facility] at [facility] to provide your oncology and hematology care.  To afford each patient quality time with our provider, please arrive at least 15 minutes before your scheduled appointment time.   If you have a lab appointment with the Cancer Center please come in thru the  Main Entrance and check in at the main information desk  You need to re-schedule your appointment should you arrive 10 or more minutes late.  We strive to give you quality time with our providers, and arriving late affects you and other patients whose appointments are after yours.  Also, if you no show three or more times for appointments you may be dismissed from the clinic at the providers discretion.     Again, thank you for choosing [facility].  Our hope is that these requests will decrease the amount of time that you wait before being seen by our physicians.       _____________________________________________________________  Should you have questions after your visit to [facility], please contact our office at [phone] between the hours of 8:00 a.m. and 4:30 p.m.  Voicemails left after 4:00 p.m. will not be returned until the following business day.  For prescription refill requests, have your pharmacy contact our office and allow 72 hours.    Cancer Center Support Programs:   > Cancer Support Group  2nd Tuesday of the month 1pm-2pm, Journey Room    

## 2019-01-25 NOTE — Progress Notes (Signed)
[place], [place] [address]   CLINIC:  Medical Oncology/Hematology  PCP:  [name], MD [place] [address] [phone]   REASON FOR VISIT:  Follow-up for right breast cancer, ER-/PR-/HER2+  CURRENT THERAPY:Herceptin every 3 weeks.   BRIEF ONCOLOGIC HISTORY:    Malignant neoplasm of upper-outer quadrant of right breast in female, estrogen receptor negative ([place])   07/03/2018 Initial Diagnosis    Palpable right breast mass for several months with skin discoloration, clinically skin trabecular thickening with nipple retraction measuring 7.2 x 4.3 x 4.2 cm, additional mass 4 o'clock position 2.6 cm, right axillary tail 1.2 cm right axillary lymph node 1.8 cm left breast irregular mass 1 cm, adjacent mass 0.9 cm together measuring 1.9 cm, no lymphadenopathy    07/03/2018 Pathology Results    Right breast biopsy: IDC grade 2, ER 0%, PR 0%, HER-2 positive, Ki-67 40%, lymph node biopsy negative, left breast biopsy complex sclerosing lesion     07/15/2018 Cancer Staging    Staging form: Breast, AJCC 8th Edition - Clinical: Stage IIB (cT3, cN0, cM0, G2, ER-, PR-, HER2+) - Signed by [name], MD on 07/15/2018    07/31/2018 - 10/30/2018 Chemotherapy    The patient had trastuzumab (HERCEPTIN) 336 mg in sodium chloride 0.9 % 250 mL chemo infusion, 4 mg/kg = 336 mg, Intravenous,  Once, 4 of 4 cycles Administration: 336 mg (07/31/2018), 168 mg (08/07/2018), 168 mg (08/28/2018), 168 mg (08/14/2018), 168 mg (08/21/2018), 168 mg (09/04/2018), 168 mg (09/11/2018), 168 mg (09/18/2018), 168 mg (10/02/2018), 168 mg (10/09/2018), 168 mg (10/16/2018), 168 mg (10/23/2018), 168 mg (10/30/2018) PACLitaxel (TAXOL) 78 mg in sodium chloride 0.9 % 250 mL chemo infusion (</= 62m/m2), 40 mg/m2 = 78 mg (50 % of original dose 80 mg/m2), Intravenous,  Once, 4 of 4 cycles Dose modification: 40 mg/m2 (50 % of original dose 80 mg/m2, Cycle 1, Reason: Patient  Age), 53.3333 mg/m2 (66.7 % of original dose 80 mg/m2, Cycle 1, Reason: Provider Judgment), 45 mg/m2 (original dose 80 mg/m2, Cycle 3, Reason: Provider Judgment) Administration: 78 mg (07/31/2018), 102 mg (08/07/2018), 102 mg (08/28/2018), 102 mg (08/14/2018), 102 mg (08/21/2018), 102 mg (09/04/2018), 102 mg (09/18/2018), 84 mg (10/02/2018), 84 mg (10/09/2018), 84 mg (10/16/2018), 84 mg (10/23/2018), 84 mg (10/30/2018)  for chemotherapy treatment.     11/13/2018 -  Chemotherapy    The patient had trastuzumab (HERCEPTIN) 504 mg in sodium chloride 0.9 % 250 mL chemo infusion, 6 mg/kg = 504 mg (100 % of original dose 6 mg/kg), Intravenous,  Once, 4 of 5 cycles Dose modification: 6 mg/kg (original dose 6 mg/kg, Cycle 1, Reason: Other (see comments), Comment: pt receving it ) Administration: 504 mg (11/13/2018), 504 mg (12/04/2018), 504 mg (01/04/2019)  for chemotherapy treatment.       CANCER STAGING: Cancer Staging Malignant neoplasm of upper-outer quadrant of right breast in female, estrogen receptor negative ([place] Staging form: Breast, AJCC 8th Edition - Clinical: Stage IIB (cT3, cN0, cM0, G2, ER-, PR-, HER2+) - Signed by GNicholas Lose MD on 07/15/2018    INTERVAL HISTORY:  Ms. [id number]. female returns for routine follow-up and consideration for next cycle of chemotherapy. She is here today alone. She states that still has the rash and itching, she applies lotion and that helps. Denies any nausea, vomiting, or diarrhea. Denies any new pains. Had not noticed any recent bleeding such as epistaxis, hematuria or hematochezia. Denies recent chest pain on  exertion, shortness of breath on minimal exertion, pre-syncopal episodes, or palpitations. Denies any numbness or tingling in hands or feet. Denies any recent fevers, infections, or recent hospitalizations. Patient reports appetite at 100% and energy level at 50%.     REVIEW OF SYSTEMS:  Review of Systems  Skin: Positive for itching and rash.      PAST MEDICAL/SURGICAL HISTORY:  Past Medical History:  Diagnosis Date  . Cancer ([place]) 06/2018   right breast cancer  . GERD (gastroesophageal reflux disease)    occasional   . Hyperlipidemia   . Hypertension    clearance with note Dr Nevada Crane on chart  . Pneumonia    2 years ago/ states occ cough with sinus drainage- no fever  . Thyroid nodule    with biopsy- states following medically   Past Surgical History:  Procedure Laterality Date  . CATARACT EXTRACTION W/PHACO  11/30/2012   Procedure: CATARACT EXTRACTION PHACO AND INTRAOCULAR LENS PLACEMENT (IOC);  Surgeon: Williams Che, MD;  Location: [facility];  Service: Ophthalmology;  Laterality: Left;  CDE:11.49  . CATARACT EXTRACTION W/PHACO Right 03/15/2013   Procedure: CATARACT EXTRACTION PHACO AND INTRAOCULAR LENS PLACEMENT (IOC);  Surgeon: Williams Che, MD;  Location: [facility];  Service: Ophthalmology;  Laterality: Right;  CDE 16.15  . COLONOSCOPY    . EXCISION OF SKIN TAG Right 06/17/2016   Procedure: SHAVE OF SKIN TAG RIGHT BUTTOCK;  Surgeon: Aviva Signs, MD;  Location: [facility];  Service: General;  Laterality: Right;  . JOINT REPLACEMENT     left knee  . MASS EXCISION Left 06/17/2016   Procedure: EXCISION SKIN MALIGNANT LESION LEFT BUTTOCK;  Surgeon: Aviva Signs, MD;  Location: [facility];  Service: General;  Laterality: Left;  . PORTACATH PLACEMENT Right 07/28/2018   Procedure: INSERTION PORT-A-CATH WITH ULTRASOUND;  Surgeon: Rolm Bookbinder, MD;  Location: [facility];  Service: General;  Laterality: Right;  . PUNCH BIOPSY OF SKIN Right 07/28/2018   Procedure: PUNCH BIOPSY OF SKIN RIGHT BREAST;  Surgeon: Rolm Bookbinder, MD;  Location: [facility];  Service: General;  Laterality: Right;  . TOTAL KNEE ARTHROPLASTY  12/16/2011   Procedure: TOTAL KNEE ARTHROPLASTY;  Surgeon: Gearlean Alf, MD;  Location: [facility];  Service: Orthopedics;  Laterality: Right;  . TUBAL LIGATION       SOCIAL HISTORY:  Social  History   Socioeconomic History  . Marital status: Married    Spouse name: Not on file  . Number of children: 2  . Years of education: some business school after HS  . Highest education level: Not on file  Occupational History  . Occupation: Cabin crew  . Occupation: Network engineer   Social Needs  . Financial resource strain: Not hard at all  . Food insecurity:    Worry: Never true    Inability: Never true  . Transportation needs:    Medical: No    Non-medical: No  Tobacco Use  . Smoking status: Never Smoker  . Smokeless tobacco: Never Used  Substance and Sexual Activity  . Alcohol use: No    Frequency: Never  . Drug use: No  . Sexual activity: Yes    Birth control/protection: None  Lifestyle  . Physical activity:    Days per week: 0 days    Minutes per session: 0 min  . Stress: Not at all  Relationships  . Social connections:    Talks on phone: More than three times a week    Gets together: More than  three times a week    Attends religious service: More than 4 times per year    Active member of club or organization: Yes    Attends meetings of clubs or organizations: Never    Relationship status: Married  . Intimate partner violence:    Fear of current or ex partner: No    Emotionally abused: No    Physically abused: No    Forced sexual activity: No  Other Topics Concern  . Not on file  Social History Narrative   Lives at home with her husband.   4 cups caffeine per day.   Right-handed.    FAMILY HISTORY:  Family History  Problem Relation Age of Onset  . Heart attack Mother   . Bronchitis Father   . Diabetes Sister   . Leukemia Brother   . Lung disease Brother   . Lung disease Sister     CURRENT MEDICATIONS:  Outpatient Encounter Medications as of 01/25/2019  Medication Sig  . acetaminophen (TYLENOL) 325 MG tablet Take 650 mg by mouth every 6 (six) hours as needed.  Marland Kitchen aspirin 325 MG tablet Take 162.5 mg by mouth daily.   . cetirizine (ZYRTEC) 10 MG tablet  Take 10 mg by mouth daily.  . hydrocortisone 1 % ointment Apply 1 application topically 2 (two) times daily.  Marland Kitchen lidocaine-prilocaine (EMLA) cream Apply to port site one hour prior to appointment and cover with plastic wrap.  . Misc. Devices MISC Please provide lymphedema sleeve for right arm  . Multiple Vitamin (MULTIVITAMIN WITH MINERALS) TABS tablet Take 1 tablet by mouth daily.  . Omega-3 Fatty Acids (FISH OIL PO) Take 1 capsule by mouth daily.   Marland Kitchen PACLitaxel (TAXOL IV) Inject into the vein once a week.  . prochlorperazine (COMPAZINE) 10 MG tablet Take 1 tablet (10 mg total) by mouth every 6 (six) hours as needed (Nausea or vomiting).  . Trastuzumab (HERCEPTIN IV) Inject into the vein once a week.   Marland Kitchen UNABLE TO FIND Take 1 capsule by mouth daily. Med Name: Tumeric Curcumin   No facility-administered encounter medications on file as of 01/25/2019.     ALLERGIES:  Allergies  Allergen Reactions  . Penicillins Rash    Has patient had a PCN reaction causing immediate rash, facial/tongue/throat swelling, SOB or lightheadedness with hypotension: No Has patient had a PCN reaction causing severe rash involving mucus membranes or skin necrosis: No Has patient had a PCN reaction that required hospitalization: No Has patient had a PCN reaction occurring within the last 10 years: No If all of the above answers are "NO", then may proceed with Cephalosporin use.      PHYSICAL EXAM:  ECOG Performance status: 1  Blood pressure is 158/80.  Pulse rate is 97.  Respirate is 18.  Temperature 98.4.  Sats are 98%.  Physical Exam Constitutional:      Appearance: Normal appearance.  Cardiovascular:     Rate and Rhythm: Normal rate and regular rhythm.     Heart sounds: Normal heart sounds.  Pulmonary:     Effort: Pulmonary effort is normal.     Breath sounds: Normal breath sounds.  Abdominal:     Palpations: Abdomen is soft. There is no mass.     Tenderness: There is no abdominal tenderness.   Musculoskeletal:        General: No swelling.  Skin:    Comments: There is erythema over the right breast, right shoulder, and right posterior ribs which is stable.  Neurological:  General: No focal deficit present.     Mental Status: She is alert and oriented to person, place, and time.  Psychiatric:        Mood and Affect: Mood normal.        Behavior: Behavior normal.   Right breast mass is stable.  Slight nipple retraction is also stable.   LABORATORY DATA:  I have reviewed the labs as listed.  CBC    Component Value Date/Time   WBC 9.5 01/25/2019 0906   RBC 4.36 01/25/2019 0906   HGB 11.9 (L) 01/25/2019 0906   HGB 12.7 11/24/2017 1626   HCT 36.6 01/25/2019 0906   HCT 36.6 11/24/2017 1626   PLT 387 01/25/2019 0906   PLT 420 (H) 11/24/2017 1626   MCV 83.9 01/25/2019 0906   MCV 88 11/24/2017 1626   MCH 27.3 01/25/2019 0906   MCHC 32.5 01/25/2019 0906   RDW 15.8 (H) 01/25/2019 0906   RDW 15.4 11/24/2017 1626   LYMPHSABS 3.3 01/25/2019 0906   LYMPHSABS 2.5 11/24/2017 1626   MONOABS 1.1 (H) 01/25/2019 0906   EOSABS 0.3 01/25/2019 0906   EOSABS 0.1 11/24/2017 1626   BASOSABS 0.1 01/25/2019 0906   BASOSABS 0.0 11/24/2017 1626   CMP Latest Ref Rng & Units 01/25/2019 01/11/2019 01/04/2019  Glucose 70 - 99 mg/dL 114(H) - 110(H)  BUN 8 - 23 mg/dL 15 - 18  Creatinine 0.44 - 1.00 mg/dL 0.96 - 0.93  Sodium 135 - 145 mmol/L 131(L) - 132(L)  Potassium 3.5 - 5.1 mmol/L 3.6 - 3.7  Chloride 98 - 111 mmol/L 100 - 101  CO2 22 - 32 mmol/L 23 - 22  Calcium 8.9 - 10.3 mg/dL 8.9 - 9.0  Total Protein 6.5 - 8.1 g/dL 6.9 7.0 6.6  Total Bilirubin 0.3 - 1.2 mg/dL 0.7 0.6 0.4  Alkaline Phos 38 - 126 U/L 167(H) 151(H) 126  AST 15 - 41 U/L 272(H) 266(H) 273(H)  ALT 0 - 44 U/L 274(H) 300(H) 349(H)       DIAGNOSTIC IMAGING:  I have independently reviewed the scans and discussed with the patient.   I have reviewed Venita Lick LPN's note and agree with the documentation.  I  personally performed a face-to-face visit, made revisions and my assessment and plan is as follows.    ASSESSMENT & PLAN:   Malignant neoplasm of upper-outer quadrant of right breast in female, estrogen receptor negative (HCC) 1.  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 - Right breast biopsy at 11:30 position on 07/03/2018 shows invasive ductal carcinoma, ER/PR negative, HER-2 positive, Ki-67 of 40%, right axillary lymph node biopsy shows benign tissue, left breast core needle biopsy at 230 o'clock position shows complex sclerosing lesion - CT chest abdomen and pelvis on 07/16/2018 showed a 6 mm nodule in the posterior left lower lobe with no other evidence of metastatic disease. -Bone scan on 07/16/2018 shows focal area of increased activity over the distal left clavicle/shoulder consistent with degenerative changes. - Skin punch biopsy of the right breast on 07/28/2018 was positive for dermal lymphatic invasion. -Echo on 07/27/2018 shows ejection fraction  of 55 to 60%. -12 cycles of weekly paclitaxel and Herceptin from 07/31/2018 through 10/30/2018.   - PET CT scan on 11/30/2018 shows small to medium sized focus of increased uptake within the right breast compatible with residual hypermetabolic tumor.  No specific findings identified to suggest FDG avid metastatic disease.  On the nondiagnostic CT portion of the exam there are several nodules identified in both lungs which are too small to reliably characterized by PET/CT.  These were not confidently identified on CT with contrast  on 07/16/2018.  CT of the chest with contrast to confirm its presence was recommended.  Extensive sigmoid diverticulosis.  Multiple foci of intense uptake identified within the sigmoid colon. -Echocardiogram on 11/16/2018 shows LVEF of 60 to 65%.  - CT of the chest with contrast dated 12/24/2018 shows response to therapy of the right breast primary and axillary adenopathy.  6 mm left lower lobe pulmonary nodule on the prior exam has resolved.  There are multiple subpleural pulmonary nodules which are new.  - Physical exam today shows erythematous skin involvement over the right breast.  There is also erythema in the right posterior ribs which is stable.  Mass in the right breast is also stable. -Her LFTs are slowly improving.  At this time it is not clear what caused the rising LFTs.  She does not report taking any herbal supplements. -She may proceed with Herceptin today.  I plan to do a PET CT scan prior to next visit in 3 weeks.  I will also plan to repeat another echocardiogram towards the end of April. -She has a follow-up appointment with Dr. Donne Hazel in April.         Orders placed this encounter:  Orders Placed This Encounter  Procedures  . NM PET Image Restag (PS) Skull Base To Thigh      Derek Jack, MD [place] [phone]

## 2019-01-25 NOTE — Progress Notes (Signed)
1000 Labs reviewed with and pt seen by Dr. [name] and pt approved for Herceptin infusion today per MD                                                                     [name] tolerated Herceptin infusion well without complaints or incident. VSS upon discharge. Pt discharged via wheelchair in satisfactory condition

## 2019-01-25 NOTE — Assessment & Plan Note (Signed)
1.  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 - Right breast biopsy at 11:30 position on 07/03/2018 shows invasive ductal carcinoma, ER/PR negative, HER-2 positive, Ki-67 of 40%, right axillary lymph node biopsy shows benign tissue, left breast core needle biopsy at 230 o'clock position shows complex sclerosing lesion - CT chest abdomen and pelvis on 07/16/2018 showed a 6 mm nodule in the posterior left lower lobe with no other evidence of metastatic disease. -Bone scan on 07/16/2018 shows focal area of increased activity over the distal left clavicle/shoulder consistent with degenerative changes. - Skin punch biopsy of the right breast on 07/28/2018 was positive for dermal lymphatic invasion. -Echo on 07/27/2018 shows ejection fraction of 55 to 60%. -12 cycles of weekly paclitaxel and Herceptin from 07/31/2018 through 10/30/2018.   - PET CT scan on 11/30/2018 shows small to medium sized focus of increased uptake within the right breast compatible with residual hypermetabolic tumor.  No specific findings identified to suggest FDG avid metastatic disease.  On the nondiagnostic CT portion of the exam there are several nodules identified in both lungs which are too small to reliably characterized by PET/CT.  These were not confidently identified on CT with contrast on 07/16/2018.  CT of the chest with contrast to confirm its presence was recommended.  Extensive sigmoid diverticulosis.  Multiple foci of intense uptake identified within the sigmoid colon. -Echocardiogram on 11/16/2018 shows LVEF  of 60 to 65%.  - CT of the chest with contrast dated 12/24/2018 shows response to therapy of the right breast primary and axillary adenopathy.  6 mm left lower lobe pulmonary nodule on the prior exam has resolved.  There are multiple subpleural pulmonary nodules which are new.  - Physical exam today shows erythematous skin involvement over the right breast.  There is also erythema in the right posterior ribs which is stable.  Mass in the right breast is also stable. -Her LFTs are slowly improving.  At this time it is not clear what caused the rising LFTs.  She does not report taking any herbal supplements. -She may proceed with Herceptin today.  I plan to do a PET CT scan prior to next visit in 3 weeks.  I will also plan to repeat another echocardiogram towards the end of April. -She has a follow-up appointment with Dr. WDonne Hazelin April.

## 2019-02-03 DIAGNOSIS — E559 Vitamin D deficiency, unspecified: Secondary | ICD-10-CM | POA: Diagnosis not present

## 2019-02-03 DIAGNOSIS — E782 Mixed hyperlipidemia: Secondary | ICD-10-CM | POA: Diagnosis not present

## 2019-02-03 DIAGNOSIS — I10 Essential (primary) hypertension: Secondary | ICD-10-CM | POA: Diagnosis not present

## 2019-02-03 DIAGNOSIS — R945 Abnormal results of liver function studies: Secondary | ICD-10-CM | POA: Diagnosis not present

## 2019-02-03 DIAGNOSIS — C50911 Malignant neoplasm of unspecified site of right female breast: Secondary | ICD-10-CM | POA: Diagnosis not present

## 2019-02-03 DIAGNOSIS — R69 Illness, unspecified: Secondary | ICD-10-CM | POA: Diagnosis not present

## 2019-02-08 ENCOUNTER — Other Ambulatory Visit: Payer: Self-pay

## 2019-02-08 ENCOUNTER — Ambulatory Visit (HOSPITAL_COMMUNITY)
Admission: RE | Admit: 2019-02-08 | Discharge: 2019-02-08 | Disposition: A | Discharge status: Home / Self Care | Payer: Medicare HMO | Source: Ambulatory Visit | Attending: Hematology | Admitting: Hematology

## 2019-02-08 DIAGNOSIS — C50411 Malignant neoplasm of upper-outer quadrant of right female breast: Secondary | ICD-10-CM | POA: Insufficient documentation

## 2019-02-08 DIAGNOSIS — Z171 Estrogen receptor negative status [ER-]: Secondary | ICD-10-CM | POA: Insufficient documentation

## 2019-02-08 DIAGNOSIS — C50911 Malignant neoplasm of unspecified site of right female breast: Secondary | ICD-10-CM | POA: Diagnosis not present

## 2019-02-08 MED ORDER — FLUDEOXYGLUCOSE F - 18 (FDG) INJECTION
10.3600 | Freq: Once | INTRAVENOUS | Status: AC | PRN
  Administered 2019-02-08: 10.36 via INTRAVENOUS

## 2019-02-09 DIAGNOSIS — Z0000 Encounter for general adult medical examination without abnormal findings: Secondary | ICD-10-CM | POA: Diagnosis not present

## 2019-02-15 ENCOUNTER — Inpatient Hospital Stay (HOSPITAL_COMMUNITY): Payer: Medicare HMO | Attending: Hematology

## 2019-02-15 ENCOUNTER — Other Ambulatory Visit: Payer: Self-pay

## 2019-02-15 ENCOUNTER — Inpatient Hospital Stay (HOSPITAL_COMMUNITY): Payer: Medicare HMO | Admitting: Hematology

## 2019-02-15 ENCOUNTER — Inpatient Hospital Stay (HOSPITAL_COMMUNITY): Payer: Medicare HMO

## 2019-02-15 ENCOUNTER — Encounter (HOSPITAL_COMMUNITY): Payer: Self-pay | Admitting: Hematology

## 2019-02-15 VITALS — BP 138/59 | HR 86 | Temp 97.6°F | Resp 16

## 2019-02-15 DIAGNOSIS — Z171 Estrogen receptor negative status [ER-]: Secondary | ICD-10-CM

## 2019-02-15 DIAGNOSIS — R918 Other nonspecific abnormal finding of lung field: Secondary | ICD-10-CM

## 2019-02-15 DIAGNOSIS — Z8701 Personal history of pneumonia (recurrent): Secondary | ICD-10-CM | POA: Insufficient documentation

## 2019-02-15 DIAGNOSIS — Z5112 Encounter for antineoplastic immunotherapy: Secondary | ICD-10-CM | POA: Diagnosis not present

## 2019-02-15 DIAGNOSIS — E041 Nontoxic single thyroid nodule: Secondary | ICD-10-CM | POA: Diagnosis not present

## 2019-02-15 DIAGNOSIS — Z806 Family history of leukemia: Secondary | ICD-10-CM | POA: Insufficient documentation

## 2019-02-15 DIAGNOSIS — K219 Gastro-esophageal reflux disease without esophagitis: Secondary | ICD-10-CM | POA: Diagnosis not present

## 2019-02-15 DIAGNOSIS — C50411 Malignant neoplasm of upper-outer quadrant of right female breast: Secondary | ICD-10-CM

## 2019-02-15 DIAGNOSIS — Z7982 Long term (current) use of aspirin: Secondary | ICD-10-CM

## 2019-02-15 DIAGNOSIS — Z9221 Personal history of antineoplastic chemotherapy: Secondary | ICD-10-CM | POA: Insufficient documentation

## 2019-02-15 DIAGNOSIS — Z79899 Other long term (current) drug therapy: Secondary | ICD-10-CM | POA: Insufficient documentation

## 2019-02-15 DIAGNOSIS — I10 Essential (primary) hypertension: Secondary | ICD-10-CM | POA: Diagnosis not present

## 2019-02-15 DIAGNOSIS — Z7189 Other specified counseling: Secondary | ICD-10-CM | POA: Insufficient documentation

## 2019-02-15 DIAGNOSIS — E785 Hyperlipidemia, unspecified: Secondary | ICD-10-CM | POA: Insufficient documentation

## 2019-02-15 LAB — COMPREHENSIVE METABOLIC PANEL
ALT: 200 U/L — ABNORMAL HIGH (ref 0–44)
AST: 235 U/L — ABNORMAL HIGH (ref 15–41)
Albumin: 3 g/dL — ABNORMAL LOW (ref 3.5–5.0)
Alkaline Phosphatase: 151 U/L — ABNORMAL HIGH (ref 38–126)
Anion gap: 11 (ref 5–15)
BUN: 14 mg/dL (ref 8–23)
CO2: 21 mmol/L — ABNORMAL LOW (ref 22–32)
Calcium: 8.7 mg/dL — ABNORMAL LOW (ref 8.9–10.3)
Chloride: 100 mmol/L (ref 98–111)
Creatinine, Ser: 0.87 mg/dL (ref 0.44–1.00)
GFR calc Af Amer: >60 mL/min (ref >60)
GFR calc non Af Amer: >60 mL/min (ref >60)
Glucose, Bld: 112 mg/dL — ABNORMAL HIGH (ref 70–99)
Potassium: 3.5 mmol/L (ref 3.5–5.1)
Sodium: 132 mmol/L — ABNORMAL LOW (ref 135–145)
Total Bilirubin: 0.8 mg/dL (ref 0.3–1.2)
Total Protein: 6.6 g/dL (ref 6.5–8.1)

## 2019-02-15 LAB — CBC WITH DIFFERENTIAL/PLATELET
Abs Immature Granulocytes: 0.02 10*3/uL (ref 0.00–0.07)
Basophils Absolute: 0.1 10*3/uL (ref 0.0–0.1)
Basophils Relative: 1 %
Eosinophils Absolute: 0.2 10*3/uL (ref 0.0–0.5)
Eosinophils Relative: 2 %
HCT: 36.6 % (ref 36.0–46.0)
Hemoglobin: 11.9 g/dL — ABNORMAL LOW (ref 12.0–15.0)
Immature Granulocytes: 0 %
Lymphocytes Relative: 37 %
Lymphs Abs: 3.1 10*3/uL (ref 0.7–4.0)
MCH: 27 pg (ref 26.0–34.0)
MCHC: 32.5 g/dL (ref 30.0–36.0)
MCV: 83.2 fL (ref 80.0–100.0)
Monocytes Absolute: 1 10*3/uL (ref 0.1–1.0)
Monocytes Relative: 12 %
Neutro Abs: 4.1 10*3/uL (ref 1.7–7.7)
Neutrophils Relative %: 48 %
Platelets: 330 10*3/uL (ref 150–400)
RBC: 4.4 MIL/uL (ref 3.87–5.11)
RDW: 17.1 % — ABNORMAL HIGH (ref 11.5–15.5)
WBC: 8.4 10*3/uL (ref 4.0–10.5)
nRBC: 0 % (ref 0.0–0.2)

## 2019-02-15 MED ORDER — SODIUM CHLORIDE 0.9 % IV SOLN
Freq: Once | INTRAVENOUS | Status: AC
  Administered 2019-02-15: 12:00:00 via INTRAVENOUS

## 2019-02-15 MED ORDER — HEPARIN SOD (PORK) LOCK FLUSH 100 UNIT/ML IV SOLN
500.0000 [IU] | Freq: Once | INTRAVENOUS | Status: AC | PRN
  Administered 2019-02-15: 500 [IU]

## 2019-02-15 MED ORDER — SODIUM CHLORIDE 0.9% FLUSH
10.0000 mL | INTRAVENOUS | Status: DC | PRN
  Administered 2019-02-15: 10 mL
  Filled 2019-02-15: qty 10

## 2019-02-15 MED ORDER — DIPHENHYDRAMINE HCL 25 MG PO CAPS
50.0000 mg | ORAL_CAPSULE | Freq: Once | ORAL | Status: AC
  Administered 2019-02-15: 25 mg via ORAL
  Filled 2019-02-15: qty 2

## 2019-02-15 MED ORDER — ACETAMINOPHEN 325 MG PO TABS
650.0000 mg | ORAL_TABLET | Freq: Once | ORAL | Status: AC
  Administered 2019-02-15: 12:00:00 650 mg via ORAL
  Filled 2019-02-15: qty 2

## 2019-02-15 MED ORDER — SODIUM CHLORIDE 0.9 % IV SOLN
3.0000 mg/kg | Freq: Once | INTRAVENOUS | Status: AC
  Administered 2019-02-15: 13:00:00 240 mg via INTRAVENOUS
  Filled 2019-02-15: qty 8

## 2019-02-15 NOTE — Patient Instructions (Addendum)
[place] at [facility] Discharge Instructions  You were seen today by Dr. [name]. He went over your recent scan results, some local progression, the area has gotten a little bigger. Due to this he would like you to add another medication to your current treatment. The side effects of this medication are about the same but it can make you a little more tired. He will see you back in 3 weeks for labs and follow up.   Thank you for choosing [place] at [facility] to provide your oncology and hematology care.  To afford each patient quality time with our provider, please arrive at least 15 minutes before your scheduled appointment time.   If you have a lab appointment with the [facility] please come in thru the  Main Entrance and check in at the main information desk  You need to re-schedule your appointment should you arrive 10 or more minutes late.  We strive to give you quality time with our providers, and arriving late affects you and other patients whose appointments are after yours.  Also, if you no show three or more times for appointments you may be dismissed from the clinic at the providers discretion.     Again, thank you for choosing [facility].  Our hope is that these requests will decrease the amount of time that you wait before being seen by our physicians.       _____________________________________________________________  Should you have questions after your visit to [facility], please contact our office at [phone] between the hours of 8:00 a.m. and 4:30 p.m.  Voicemails left after 4:00 p.m. will not be returned until the following business day.  For prescription refill requests, have your pharmacy contact our office and allow 72 hours.    Cancer Center Support Programs:   > Cancer Support Group  2nd Tuesday of the month 1pm-2pm, Journey Room

## 2019-02-15 NOTE — Progress Notes (Signed)
Labs reviewed at office visit today and will change to Kadcyla per MD. Consent obtained and chemocare information given and reveiwed.   Treatment given per orders. Patient tolerated it well without problems. Vitals stable and discharged home from clinic via wheelchair. Follow up as scheduled.

## 2019-02-15 NOTE — Assessment & Plan Note (Signed)
1.  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 - Right breast biopsy at 11:30 position on 07/03/2018 shows invasive ductal carcinoma, ER/PR negative, HER-2 positive, Ki-67 of 40%, right axillary lymph node biopsy shows benign tissue, left breast core needle biopsy at 230 o'clock position shows complex sclerosing lesion - CT chest abdomen and pelvis on 07/16/2018 showed a 6 mm nodule in the posterior left lower lobe with no other evidence of metastatic disease. -Bone scan on 07/16/2018 shows focal area of increased activity over the distal left clavicle/shoulder consistent with degenerative changes. - Skin punch biopsy of the right breast on 07/28/2018 was positive for dermal lymphatic invasion. -Echo on 07/27/2018 shows ejection fraction of 55 to 60%. -12 cycles of weekly paclitaxel and Herceptin from 07/31/2018 through 10/30/2018.   - PET CT scan on 11/30/2018 shows small to medium sized focus of increased uptake within the right breast compatible with residual hypermetabolic tumor.  No specific findings identified to suggest FDG avid metastatic disease.  On the nondiagnostic CT portion of the exam there are several nodules identified in both lungs which are too small to reliably characterized by PET/CT.  These were not confidently identified on CT with contrast on 07/16/2018.  CT of the chest with contrast to confirm its presence was recommended.  Extensive sigmoid diverticulosis.  Multiple foci of intense uptake identified within the sigmoid colon. -Echocardiogram on 11/16/2018 shows LVEF  of 60 to 65%.  - CT of the chest with contrast dated 12/24/2018 shows response to therapy of the right breast primary and axillary adenopathy.  6 mm left lower lobe pulmonary nodule on the prior exam has resolved.  There are multiple subpleural pulmonary nodules which are new.  - PET CT scan on 02/09/2019 shows local progression of the disease with enlarging and increasingly hypermetabolic right breast mass.  Multiple tiny 2-4 mm pulmonary nodules are stable compared to the prior PET scan in February.  No definitive signs of metastatic disease in the neck chest abdomen or pelvis. -Physical examination shows erythematous skin involvement over the right breast, extending into the right axillary region and right posterior chest wall.  This has been stable.  Mass in the right breast is also palpable and stable. -Her LFTs are also improving but rather slowly.  I have called her husband and have asked him to stop the herbal supplements. -I have recommended a change in therapy at this time.  Options include adding Pertuzumab versus changing the therapy to Kadcyla.  I think Kadcyla would be a better option in terms of response. -We have called her insurance and got authorization.  We talked about side effects of Kadcyla in detail.  I have also called her husband and discussed this. -She may proceed with her first cycle today.  We will reevaluate her in 3 weeks.  I will start her at low dose of 3 mg/kg.  I plan to increase it to 3.6 mg/kg at next visit. -She reportedly has a follow-up appointment with Dr. Wakefield on the 27th of this month.   

## 2019-02-15 NOTE — Patient Instructions (Signed)
[facility] Discharge Instructions for Patients Receiving Chemotherapy  Today you received the following chemotherapy agents   To help prevent nausea and vomiting after your treatment, we encourage you to take your nausea medication   If you develop nausea and vomiting that is not controlled by your nausea medication, call the clinic.   BELOW ARE SYMPTOMS THAT SHOULD BE REPORTED IMMEDIATELY:  *FEVER GREATER THAN 100.5 F  *CHILLS WITH OR WITHOUT FEVER  NAUSEA AND VOMITING THAT IS NOT CONTROLLED WITH YOUR NAUSEA MEDICATION  *UNUSUAL SHORTNESS OF BREATH  *UNUSUAL BRUISING OR BLEEDING  TENDERNESS IN MOUTH AND THROAT WITH OR WITHOUT PRESENCE OF ULCERS  *URINARY PROBLEMS  *BOWEL PROBLEMS  UNUSUAL RASH Items with * indicate a potential emergency and should be followed up as soon as possible.  Feel free to call the clinic should you have any questions or concerns. The clinic phone number is [phone].  Please show the CHEMO ALERT CARD at check-in to the Emergency Department and triage nurse.   

## 2019-02-15 NOTE — Progress Notes (Signed)
[name], [place] [address]   CLINIC:  Medical Oncology/Hematology  PCP:  [name], MD [place] [address] [phone]   REASON FOR VISIT:  Follow-up for right breast cancer, ER-/PR-/HER2+  CURRENT THERAPY:Herceptin every 3 weeks.   BRIEF ONCOLOGIC HISTORY:    Malignant neoplasm of upper-outer quadrant of right breast in female, estrogen receptor negative ([place])   07/03/2018 Initial Diagnosis    Palpable right breast mass for several months with skin discoloration, clinically skin trabecular thickening with nipple retraction measuring 7.2 x 4.3 x 4.2 cm, additional mass 4 o'clock position 2.6 cm, right axillary tail 1.2 cm right axillary lymph node 1.8 cm left breast irregular mass 1 cm, adjacent mass 0.9 cm together measuring 1.9 cm, no lymphadenopathy    07/03/2018 Pathology Results    Right breast biopsy: IDC grade 2, ER 0%, PR 0%, HER-2 positive, Ki-67 40%, lymph node biopsy negative, left breast biopsy complex sclerosing lesion     07/15/2018 Cancer Staging    Staging form: Breast, AJCC 8th Edition - Clinical: Stage IIB (cT3, cN0, cM0, G2, ER-, PR-, HER2+) - Signed by [name], MD on 07/15/2018    07/31/2018 - 10/30/2018 Chemotherapy    The patient had trastuzumab (HERCEPTIN) 336 mg in sodium chloride 0.9 % 250 mL chemo infusion, 4 mg/kg = 336 mg, Intravenous,  Once, 4 of 4 cycles Administration: 336 mg (07/31/2018), 168 mg (08/07/2018), 168 mg (08/28/2018), 168 mg (08/14/2018), 168 mg (08/21/2018), 168 mg (09/04/2018), 168 mg (09/11/2018), 168 mg (09/18/2018), 168 mg (10/02/2018), 168 mg (10/09/2018), 168 mg (10/16/2018), 168 mg (10/23/2018), 168 mg (10/30/2018) PACLitaxel (TAXOL) 78 mg in sodium chloride 0.9 % 250 mL chemo infusion (</= 73m/m2), 40 mg/m2 = 78 mg (50 % of original dose 80 mg/m2), Intravenous,  Once, 4 of 4 cycles Dose modification: 40 mg/m2 (50 % of original dose 80 mg/m2, Cycle 1, Reason: Patient  Age), 53.3333 mg/m2 (66.7 % of original dose 80 mg/m2, Cycle 1, Reason: Provider Judgment), 45 mg/m2 (original dose 80 mg/m2, Cycle 3, Reason: Provider Judgment) Administration: 78 mg (07/31/2018), 102 mg (08/07/2018), 102 mg (08/28/2018), 102 mg (08/14/2018), 102 mg (08/21/2018), 102 mg (09/04/2018), 102 mg (09/18/2018), 84 mg (10/02/2018), 84 mg (10/09/2018), 84 mg (10/16/2018), 84 mg (10/23/2018), 84 mg (10/30/2018)  for chemotherapy treatment.     11/13/2018 - 02/14/2019 Chemotherapy    The patient had trastuzumab (HERCEPTIN) 504 mg in sodium chloride 0.9 % 250 mL chemo infusion, 6 mg/kg = 504 mg (100 % of original dose 6 mg/kg), Intravenous,  Once, 4 of 5 cycles Dose modification: 6 mg/kg (original dose 6 mg/kg, Cycle 1, Reason: Other (see comments), Comment: pt receving it ) Administration: 504 mg (11/13/2018), 504 mg (12/04/2018), 504 mg (01/04/2019), 504 mg (01/25/2019)  for chemotherapy treatment.     02/15/2019 -  Chemotherapy    The patient had ado-trastuzumab emtansine (KADCYLA) 240 mg in sodium chloride 0.9 % 250 mL chemo infusion, 3 mg/kg = 240 mg (100 % of original dose 3 mg/kg), Intravenous, Once, 1 of 5 cycles Dose modification: 3 mg/kg (original dose 3 mg/kg, Cycle 1, Reason: Provider Judgment) Administration: 240 mg (02/15/2019)  for chemotherapy treatment.       CANCER STAGING: Cancer Staging Malignant neoplasm of upper-outer quadrant of right breast in female, estrogen receptor negative ([place] Staging form: Breast, AJCC 8th Edition - Clinical: Stage IIB (cT3, cN0, cM0, G2, ER-, PR-, HER2+) - Signed by GNicholas Lose MD on  07/15/2018    INTERVAL HISTORY:  [name] 85 y.o. female returns for routine follow-up and consideration for next cycle of chemotherapy. She is here today alone. She states that. Denies any nausea, vomiting, or diarrhea. Denies any new pains. Had not noticed any recent bleeding such as epistaxis, hematuria or hematochezia. Denies recent chest pain on exertion,  shortness of breath on minimal exertion, pre-syncopal episodes, or palpitations. Denies any numbness or tingling in hands or feet. Denies any recent fevers, infections, or recent hospitalizations. Patient reports appetite at 100% and energy level at 100%.   REVIEW OF SYSTEMS:  Review of Systems  All other systems reviewed and are negative.    PAST MEDICAL/SURGICAL HISTORY:  Past Medical History:  Diagnosis Date  . Cancer ([facility]) 06/2018   right breast cancer  . GERD (gastroesophageal reflux disease)    occasional   . Hyperlipidemia   . Hypertension    clearance with note Dr Nevada Crane on chart  . Pneumonia    2 years ago/ states occ cough with sinus drainage- no fever  . Thyroid nodule    with biopsy- states following medically   Past Surgical History:  Procedure Laterality Date  . CATARACT EXTRACTION W/PHACO  11/30/2012   Procedure: CATARACT EXTRACTION PHACO AND INTRAOCULAR LENS PLACEMENT (IOC);  Surgeon: Williams Che, MD;  Location: [facility];  Service: Ophthalmology;  Laterality: Left;  CDE:11.49  . CATARACT EXTRACTION W/PHACO Right 03/15/2013   Procedure: CATARACT EXTRACTION PHACO AND INTRAOCULAR LENS PLACEMENT (IOC);  Surgeon: Williams Che, MD;  Location: [facility];  Service: Ophthalmology;  Laterality: Right;  CDE 16.15  . COLONOSCOPY    . EXCISION OF SKIN TAG Right 06/17/2016   Procedure: SHAVE OF SKIN TAG RIGHT BUTTOCK;  Surgeon: Aviva Signs, MD;  Location: [facility];  Service: General;  Laterality: Right;  . JOINT REPLACEMENT     left knee  . MASS EXCISION Left 06/17/2016   Procedure: EXCISION SKIN MALIGNANT LESION LEFT BUTTOCK;  Surgeon: Aviva Signs, MD;  Location: [facility];  Service: General;  Laterality: Left;  . PORTACATH PLACEMENT Right 07/28/2018   Procedure: INSERTION PORT-A-CATH WITH ULTRASOUND;  Surgeon: Rolm Bookbinder, MD;  Location: [facility];  Service: General;  Laterality: Right;  . PUNCH BIOPSY OF SKIN Right 07/28/2018   Procedure: PUNCH BIOPSY OF  SKIN RIGHT BREAST;  Surgeon: Rolm Bookbinder, MD;  Location: [facility];  Service: General;  Laterality: Right;  . TOTAL KNEE ARTHROPLASTY  12/16/2011   Procedure: TOTAL KNEE ARTHROPLASTY;  Surgeon: Gearlean Alf, MD;  Location: [facility];  Service: Orthopedics;  Laterality: Right;  . TUBAL LIGATION       SOCIAL HISTORY:  Social History   Socioeconomic History  . Marital status: Married    Spouse name: Not on file  . Number of children: 2  . Years of education: some business school after HS  . Highest education level: Not on file  Occupational History  . Occupation: Cabin crew  . Occupation: Network engineer   Social Needs  . Financial resource strain: Not hard at all  . Food insecurity:    Worry: Never true    Inability: Never true  . Transportation needs:    Medical: No    Non-medical: No  Tobacco Use  . Smoking status: Never Smoker  . Smokeless tobacco: Never Used  Substance and Sexual Activity  . Alcohol use: No    Frequency: Never  . Drug use: No  . Sexual activity: Yes  Birth control/protection: None  Lifestyle  . Physical activity:    Days per week: 0 days    Minutes per session: 0 min  . Stress: Not at all  Relationships  . Social connections:    Talks on phone: More than three times a week    Gets together: More than three times a week    Attends religious service: More than 4 times per year    Active member of club or organization: Yes    Attends meetings of clubs or organizations: Never    Relationship status: Married  . Intimate partner violence:    Fear of current or ex partner: No    Emotionally abused: No    Physically abused: No    Forced sexual activity: No  Other Topics Concern  . Not on file  Social History Narrative   Lives at home with her husband.   4 cups caffeine per day.   Right-handed.    FAMILY HISTORY:  Family History  Problem Relation Age of Onset  . Heart attack Mother   . Bronchitis Father   . Diabetes Sister    . Leukemia Brother   . Lung disease Brother   . Lung disease Sister     CURRENT MEDICATIONS:  Outpatient Encounter Medications as of 02/15/2019  Medication Sig  . acetaminophen (TYLENOL) 325 MG tablet Take 650 mg by mouth every 6 (six) hours as needed.  Marland Kitchen aspirin 325 MG tablet Take 162.5 mg by mouth daily.   . cetirizine (ZYRTEC) 10 MG tablet Take 10 mg by mouth daily.  . hydrocortisone 1 % ointment Apply 1 application topically 2 (two) times daily.  Marland Kitchen lidocaine-prilocaine (EMLA) cream Apply to port site one hour prior to appointment and cover with plastic wrap.  . Misc. Devices MISC Please provide lymphedema sleeve for right arm  . Multiple Vitamin (MULTIVITAMIN WITH MINERALS) TABS tablet Take 1 tablet by mouth daily.  . Omega-3 Fatty Acids (FISH OIL PO) Take 1 capsule by mouth daily.   Marland Kitchen PACLitaxel (TAXOL IV) Inject into the vein once a week.  . prochlorperazine (COMPAZINE) 10 MG tablet Take 1 tablet (10 mg total) by mouth every 6 (six) hours as needed (Nausea or vomiting).  . Trastuzumab (HERCEPTIN IV) Inject into the vein once a week.   Marland Kitchen UNABLE TO FIND Take 1 capsule by mouth daily. Med Name: Tumeric Curcumin   No facility-administered encounter medications on file as of 02/15/2019.     ALLERGIES:  Allergies  Allergen Reactions  . Penicillins Rash    Has patient had a PCN reaction causing immediate rash, facial/tongue/throat swelling, SOB or lightheadedness with hypotension: No Has patient had a PCN reaction causing severe rash involving mucus membranes or skin necrosis: No Has patient had a PCN reaction that required hospitalization: No Has patient had a PCN reaction occurring within the last 10 years: No If all of the above answers are "NO", then may proceed with Cephalosporin use.      PHYSICAL EXAM:  ECOG Performance status: 1  Vitals:   02/15/19 0910  BP: 123/80  Pulse: 95  Resp: 16  Temp: 98.9 F (37.2 C)  SpO2: 100%   Filed Weights   02/15/19 0910   Weight: 177 lb 11.2 oz (80.6 kg)    Physical Exam Vitals signs reviewed.  Constitutional:      Appearance: Normal appearance.  Cardiovascular:     Rate and Rhythm: Normal rate and regular rhythm.     Heart sounds: Normal heart  sounds.  Pulmonary:     Effort: Pulmonary effort is normal.     Breath sounds: Normal breath sounds.  Abdominal:     General: Bowel sounds are normal.     Palpations: Abdomen is soft. There is no mass.     Tenderness: There is no abdominal tenderness.  Musculoskeletal:        General: No swelling.  Skin:    General: Skin is warm.  Neurological:     General: No focal deficit present.     Mental Status: She is alert and oriented to person, place, and time.  Psychiatric:        Mood and Affect: Mood normal.        Behavior: Behavior normal.    Erythema over the right breast and chest wall and upper arm and posterior chest wall is stable.  LABORATORY DATA:  I have reviewed the labs as listed.  CBC    Component Value Date/Time   WBC 8.4 02/15/2019 0918   RBC 4.40 02/15/2019 0918   HGB 11.9 (L) 02/15/2019 0918   HGB 12.7 11/24/2017 1626   HCT 36.6 02/15/2019 0918   HCT 36.6 11/24/2017 1626   PLT 330 02/15/2019 0918   PLT 420 (H) 11/24/2017 1626   MCV 83.2 02/15/2019 0918   MCV 88 11/24/2017 1626   MCH 27.0 02/15/2019 0918   MCHC 32.5 02/15/2019 0918   RDW 17.1 (H) 02/15/2019 0918   RDW 15.4 11/24/2017 1626   LYMPHSABS 3.1 02/15/2019 0918   LYMPHSABS 2.5 11/24/2017 1626   MONOABS 1.0 02/15/2019 0918   EOSABS 0.2 02/15/2019 0918   EOSABS 0.1 11/24/2017 1626   BASOSABS 0.1 02/15/2019 0918   BASOSABS 0.0 11/24/2017 1626   CMP Latest Ref Rng & Units 02/15/2019 01/25/2019 01/11/2019  Glucose 70 - 99 mg/dL 112(H) 114(H) -  BUN 8 - 23 mg/dL 14 15 -  Creatinine 0.44 - 1.00 mg/dL 0.87 0.96 -  Sodium 135 - 145 mmol/L 132(L) 131(L) -  Potassium 3.5 - 5.1 mmol/L 3.5 3.6 -  Chloride 98 - 111 mmol/L 100 100 -  CO2 22 - 32 mmol/L 21(L) 23 -  Calcium  8.9 - 10.3 mg/dL 8.7(L) 8.9 -  Total Protein 6.5 - 8.1 g/dL 6.6 6.9 7.0  Total Bilirubin 0.3 - 1.2 mg/dL 0.8 0.7 0.6  Alkaline Phos 38 - 126 U/L 151(H) 167(H) 151(H)  AST 15 - 41 U/L 235(H) 272(H) 266(H)  ALT 0 - 44 U/L 200(H) 274(H) 300(H)       DIAGNOSTIC IMAGING:  I have independently reviewed the scans and discussed with the patient.   I have reviewed Venita Lick LPN's note and agree with the documentation.  I personally performed a face-to-face visit, made revisions and my assessment and plan is as follows.    ASSESSMENT & PLAN:   Malignant neoplasm of upper-outer quadrant of right breast in female, estrogen receptor negative ([facility]) 1.  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 - Right breast biopsy at 11:30 position on 07/03/2018 shows invasive ductal carcinoma, ER/PR negative, HER-2 positive, Ki-67 of 40%, right axillary lymph  node biopsy shows benign tissue, left breast core needle biopsy at 230 o'clock position shows complex sclerosing lesion - CT chest abdomen and pelvis on 07/16/2018 showed a 6 mm nodule in the posterior left lower lobe with no other evidence of metastatic disease. -Bone scan on 07/16/2018 shows focal area of increased activity over the distal left clavicle/shoulder consistent with degenerative changes. - Skin punch biopsy of the right breast on 07/28/2018 was positive for dermal lymphatic invasion. -Echo on 07/27/2018 shows ejection fraction of 55 to 60%. -12 cycles of weekly paclitaxel and Herceptin from 07/31/2018 through 10/30/2018.    - PET CT scan on 11/30/2018 shows small to medium sized focus of increased uptake within the right breast compatible with residual hypermetabolic tumor.  No specific findings identified to suggest FDG avid metastatic disease.  On the nondiagnostic CT portion of the exam there are several nodules identified in both lungs which are too small to reliably characterized by PET/CT.  These were not confidently identified on CT with contrast on 07/16/2018.  CT of the chest with contrast to confirm its presence was recommended.  Extensive sigmoid diverticulosis.  Multiple foci of intense uptake identified within the sigmoid colon. -Echocardiogram on 11/16/2018 shows LVEF of 60 to 65%.  - CT of the chest with contrast dated 12/24/2018 shows response to therapy of the right breast primary and axillary adenopathy.  6 mm left lower lobe pulmonary nodule on the prior exam has resolved.  There are multiple subpleural pulmonary nodules which are new.  - PET CT scan on 02/09/2019 shows local progression of the disease with enlarging and increasingly hypermetabolic right breast mass.  Multiple tiny 2-4 mm pulmonary nodules are stable compared to the prior PET scan in February.  No definitive signs of metastatic disease in the neck chest abdomen or pelvis. -Physical examination shows erythematous skin involvement over the right breast, extending into the right axillary region and right posterior chest wall.  This has been stable.  Mass in the right breast is also palpable and stable. -Her LFTs are also improving but rather slowly.  I have called her husband and have asked him to stop the herbal supplements. -I have recommended a change in therapy at this time.  Options include adding Pertuzumab versus changing the therapy to Kadcyla.  I think Kadcyla would be a better option in terms of response. -We have called her insurance and got authorization.  We talked about side effects of Kadcyla in detail.  I have also called her husband  and discussed this. -She may proceed with her first cycle today.  We will reevaluate her in 3 weeks.  I will start her at low dose of 3 mg/kg.  I plan to increase it to 3.6 mg/kg at next visit. -She reportedly has a follow-up appointment with Dr. Donne Hazel on the 27th of this month.    Total time spent is 40 minutes with more than 50% of the time spent face-to-face discussing scan results, treatment changes, side effects and coordination of care.    Orders placed this encounter:  No orders of the defined types were placed in this encounter.     Derek Jack, MD [place] [phone]

## 2019-02-22 DIAGNOSIS — C50919 Malignant neoplasm of unspecified site of unspecified female breast: Secondary | ICD-10-CM | POA: Diagnosis not present

## 2019-03-08 ENCOUNTER — Other Ambulatory Visit: Payer: Self-pay

## 2019-03-08 ENCOUNTER — Inpatient Hospital Stay (HOSPITAL_COMMUNITY): Payer: Medicare HMO

## 2019-03-08 ENCOUNTER — Inpatient Hospital Stay (HOSPITAL_COMMUNITY): Payer: Medicare HMO | Attending: Hematology | Admitting: Hematology

## 2019-03-08 ENCOUNTER — Encounter (HOSPITAL_COMMUNITY): Payer: Self-pay | Admitting: Hematology

## 2019-03-08 VITALS — BP 144/71 | HR 91 | Temp 97.8°F | Resp 18

## 2019-03-08 DIAGNOSIS — Z9221 Personal history of antineoplastic chemotherapy: Secondary | ICD-10-CM | POA: Insufficient documentation

## 2019-03-08 DIAGNOSIS — E041 Nontoxic single thyroid nodule: Secondary | ICD-10-CM | POA: Insufficient documentation

## 2019-03-08 DIAGNOSIS — K219 Gastro-esophageal reflux disease without esophagitis: Secondary | ICD-10-CM | POA: Insufficient documentation

## 2019-03-08 DIAGNOSIS — C50411 Malignant neoplasm of upper-outer quadrant of right female breast: Secondary | ICD-10-CM | POA: Diagnosis not present

## 2019-03-08 DIAGNOSIS — I10 Essential (primary) hypertension: Secondary | ICD-10-CM | POA: Insufficient documentation

## 2019-03-08 DIAGNOSIS — Z8701 Personal history of pneumonia (recurrent): Secondary | ICD-10-CM | POA: Diagnosis not present

## 2019-03-08 DIAGNOSIS — E785 Hyperlipidemia, unspecified: Secondary | ICD-10-CM | POA: Diagnosis not present

## 2019-03-08 DIAGNOSIS — Z171 Estrogen receptor negative status [ER-]: Secondary | ICD-10-CM | POA: Diagnosis not present

## 2019-03-08 DIAGNOSIS — Z5112 Encounter for antineoplastic immunotherapy: Secondary | ICD-10-CM | POA: Insufficient documentation

## 2019-03-08 DIAGNOSIS — Z79899 Other long term (current) drug therapy: Secondary | ICD-10-CM | POA: Diagnosis not present

## 2019-03-08 DIAGNOSIS — R948 Abnormal results of function studies of other organs and systems: Secondary | ICD-10-CM | POA: Diagnosis not present

## 2019-03-08 LAB — CBC WITH DIFFERENTIAL/PLATELET
Abs Immature Granulocytes: 0.01 10*3/uL (ref 0.00–0.07)
Basophils Absolute: 0.1 10*3/uL (ref 0.0–0.1)
Basophils Relative: 1 %
Eosinophils Absolute: 0.1 10*3/uL (ref 0.0–0.5)
Eosinophils Relative: 2 %
HCT: 39 % (ref 36.0–46.0)
Hemoglobin: 12.7 g/dL (ref 12.0–15.0)
Immature Granulocytes: 0 %
Lymphocytes Relative: 24 %
Lymphs Abs: 1.9 10*3/uL (ref 0.7–4.0)
MCH: 27.2 pg (ref 26.0–34.0)
MCHC: 32.6 g/dL (ref 30.0–36.0)
MCV: 83.5 fL (ref 80.0–100.0)
Monocytes Absolute: 1 10*3/uL (ref 0.1–1.0)
Monocytes Relative: 13 %
Neutro Abs: 4.9 10*3/uL (ref 1.7–7.7)
Neutrophils Relative %: 60 %
Platelets: 267 10*3/uL (ref 150–400)
RBC: 4.67 MIL/uL (ref 3.87–5.11)
RDW: 18.3 % — ABNORMAL HIGH (ref 11.5–15.5)
WBC: 8 10*3/uL (ref 4.0–10.5)
nRBC: 0 % (ref 0.0–0.2)

## 2019-03-08 LAB — COMPREHENSIVE METABOLIC PANEL
ALT: 155 U/L — ABNORMAL HIGH (ref 0–44)
AST: 297 U/L — ABNORMAL HIGH (ref 15–41)
Albumin: 2.9 g/dL — ABNORMAL LOW (ref 3.5–5.0)
Alkaline Phosphatase: 172 U/L — ABNORMAL HIGH (ref 38–126)
Anion gap: 12 (ref 5–15)
BUN: 14 mg/dL (ref 8–23)
CO2: 24 mmol/L (ref 22–32)
Calcium: 9.1 mg/dL (ref 8.9–10.3)
Chloride: 99 mmol/L (ref 98–111)
Creatinine, Ser: 0.96 mg/dL (ref 0.44–1.00)
GFR calc Af Amer: >60 mL/min (ref >60)
GFR calc non Af Amer: 54 mL/min — ABNORMAL LOW (ref >60)
Glucose, Bld: 122 mg/dL — ABNORMAL HIGH (ref 70–99)
Potassium: 3.8 mmol/L (ref 3.5–5.1)
Sodium: 135 mmol/L (ref 135–145)
Total Bilirubin: 0.7 mg/dL (ref 0.3–1.2)
Total Protein: 6.7 g/dL (ref 6.5–8.1)

## 2019-03-08 MED ORDER — SODIUM CHLORIDE 0.9 % IV SOLN
Freq: Once | INTRAVENOUS | Status: AC
  Administered 2019-03-08: 11:00:00 via INTRAVENOUS

## 2019-03-08 MED ORDER — SODIUM CHLORIDE 0.9% FLUSH
10.0000 mL | INTRAVENOUS | Status: DC | PRN
  Administered 2019-03-08: 10 mL
  Filled 2019-03-08: qty 10

## 2019-03-08 MED ORDER — DIPHENHYDRAMINE HCL 25 MG PO CAPS
50.0000 mg | ORAL_CAPSULE | Freq: Once | ORAL | Status: AC
  Administered 2019-03-08: 50 mg via ORAL

## 2019-03-08 MED ORDER — SODIUM CHLORIDE 0.9 % IV SOLN
3.0000 mg/kg | Freq: Once | INTRAVENOUS | Status: AC
  Administered 2019-03-08: 240 mg via INTRAVENOUS
  Filled 2019-03-08: qty 4

## 2019-03-08 MED ORDER — HEPARIN SOD (PORK) LOCK FLUSH 100 UNIT/ML IV SOLN
500.0000 [IU] | Freq: Once | INTRAVENOUS | Status: AC | PRN
  Administered 2019-03-08: 500 [IU]

## 2019-03-08 MED ORDER — ACETAMINOPHEN 325 MG PO TABS
650.0000 mg | ORAL_TABLET | Freq: Once | ORAL | Status: AC
  Administered 2019-03-08: 650 mg via ORAL

## 2019-03-08 MED ORDER — ACETAMINOPHEN 325 MG PO TABS
ORAL_TABLET | ORAL | Status: AC
  Filled 2019-03-08: qty 2

## 2019-03-08 MED ORDER — DIPHENHYDRAMINE HCL 25 MG PO CAPS
ORAL_CAPSULE | ORAL | Status: AC
  Filled 2019-03-08: qty 1

## 2019-03-08 NOTE — Progress Notes (Signed)
[place] reviewed with Dr. [name] and pt approved for Kadcyla infusion today per MD                    [name] tolerated Kadcyla infusion well without complaints or incident. VSS upon discharge. Pt discharged via wheelchair in satisfactory condition

## 2019-03-08 NOTE — Patient Instructions (Signed)
[facility] at [facility] Discharge Instructions  Labs drawn peripherally today   Thank you for choosing [facility] at [facility] to provide your oncology and hematology care.  To afford each patient quality time with our provider, please arrive at least 15 minutes before your scheduled appointment time.   If you have a lab appointment with the Cancer Center please come in thru the  Main Entrance and check in at the main information desk  You need to re-schedule your appointment should you arrive 10 or more minutes late.  We strive to give you quality time with our providers, and arriving late affects you and other patients whose appointments are after yours.  Also, if you no show three or more times for appointments you may be dismissed from the clinic at the providers discretion.     Again, thank you for choosing [facility].  Our hope is that these requests will decrease the amount of time that you wait before being seen by our physicians.       _____________________________________________________________  Should you have questions after your visit to [facility], please contact our office at [phone] between the hours of 8:00 a.m. and 4:30 p.m.  Voicemails left after 4:00 p.m. will not be returned until the following business day.  For prescription refill requests, have your pharmacy contact our office and allow 72 hours.    Cancer Center Support Programs:   > Cancer Support Group  2nd Tuesday of the month 1pm-2pm, Journey Room   

## 2019-03-08 NOTE — Progress Notes (Signed)
[place], [place] [address]   CLINIC:  Medical Oncology/Hematology  PCP:  [name], MD [address] [place] [address] [phone]   REASON FOR VISIT:  Follow-up for breast cancer   BRIEF ONCOLOGIC HISTORY:    Malignant neoplasm of upper-outer quadrant of right breast in female, estrogen receptor negative ([place])   07/03/2018 Initial Diagnosis    Palpable right breast mass for several months with skin discoloration, clinically skin trabecular thickening with nipple retraction measuring 7.2 x 4.3 x 4.2 cm, additional mass 4 o'clock position 2.6 cm, right axillary tail 1.2 cm right axillary lymph node 1.8 cm left breast irregular mass 1 cm, adjacent mass 0.9 cm together measuring 1.9 cm, no lymphadenopathy    07/03/2018 Pathology Results    Right breast biopsy: IDC grade 2, ER 0%, PR 0%, HER-2 positive, Ki-67 40%, lymph node biopsy negative, left breast biopsy complex sclerosing lesion     07/15/2018 Cancer Staging    Staging form: Breast, AJCC 8th Edition - Clinical: Stage IIB (cT3, cN0, cM0, G2, ER-, PR-, HER2+) - Signed by [name], MD on 07/15/2018    07/31/2018 - 10/30/2018 Chemotherapy    The patient had trastuzumab (HERCEPTIN) 336 mg in sodium chloride 0.9 % 250 mL chemo infusion, 4 mg/kg = 336 mg, Intravenous,  Once, 4 of 4 cycles Administration: 336 mg (07/31/2018), 168 mg (08/07/2018), 168 mg (08/28/2018), 168 mg (08/14/2018), 168 mg (08/21/2018), 168 mg (09/04/2018), 168 mg (09/11/2018), 168 mg (09/18/2018), 168 mg (10/02/2018), 168 mg (10/09/2018), 168 mg (10/16/2018), 168 mg (10/23/2018), 168 mg (10/30/2018) PACLitaxel (TAXOL) 78 mg in sodium chloride 0.9 % 250 mL chemo infusion (</= 14m/m2), 40 mg/m2 = 78 mg (50 % of original dose 80 mg/m2), Intravenous,  Once, 4 of 4 cycles Dose modification: 40 mg/m2 (50 % of original dose 80 mg/m2, Cycle 1, Reason: Patient Age), 53.3333 mg/m2 (66.7 % of original dose 80 mg/m2, Cycle 1, Reason:  Provider Judgment), 45 mg/m2 (original dose 80 mg/m2, Cycle 3, Reason: Provider Judgment) Administration: 78 mg (07/31/2018), 102 mg (08/07/2018), 102 mg (08/28/2018), 102 mg (08/14/2018), 102 mg (08/21/2018), 102 mg (09/04/2018), 102 mg (09/18/2018), 84 mg (10/02/2018), 84 mg (10/09/2018), 84 mg (10/16/2018), 84 mg (10/23/2018), 84 mg (10/30/2018)  for chemotherapy treatment.     11/13/2018 - 02/14/2019 Chemotherapy    The patient had trastuzumab (HERCEPTIN) 504 mg in sodium chloride 0.9 % 250 mL chemo infusion, 6 mg/kg = 504 mg (100 % of original dose 6 mg/kg), Intravenous,  Once, 4 of 5 cycles Dose modification: 6 mg/kg (original dose 6 mg/kg, Cycle 1, Reason: Other (see comments), Comment: pt receving it ) Administration: 504 mg (11/13/2018), 504 mg (12/04/2018), 504 mg (01/04/2019), 504 mg (01/25/2019)  for chemotherapy treatment.     02/15/2019 -  Chemotherapy    The patient had ado-trastuzumab emtansine (KADCYLA) 240 mg in sodium chloride 0.9 % 250 mL chemo infusion, 3 mg/kg = 240 mg (100 % of original dose 3 mg/kg), Intravenous, Once, 1 of 5 cycles Dose modification: 3 mg/kg (original dose 3 mg/kg, Cycle 1, Reason: Provider Judgment), 3 mg/kg (original dose 3 mg/kg, Cycle 2, Reason: Provider Judgment) Administration: 240 mg (02/15/2019)  for chemotherapy treatment.       CANCER STAGING: Cancer Staging Malignant neoplasm of upper-outer quadrant of right breast in female, estrogen receptor negative ([place] Staging form: Breast, AJCC 8th Edition - Clinical: Stage IIB (cT3, cN0, cM0, G2, ER-, PR-, HER2+) - Signed by GLindi Adie  Loleta Dicker, MD on 07/15/2018    INTERVAL HISTORY:  Ms. Ditton 85 y.o. female returns for routine follow-up and consideration for next cycle of chemotherapy. She is here today alone. She states that she has done good since her last visit. She states that she has been very tired since her last treatment, she sleeps more.  Denies any nausea, vomiting, or diarrhea. Denies any new pains. Had  not noticed any recent bleeding such as epistaxis, hematuria or hematochezia. Denies recent chest pain on exertion, shortness of breath on minimal exertion, pre-syncopal episodes, or palpitations. Denies any numbness or tingling in hands or feet. Denies any recent fevers, infections, or recent hospitalizations. Patient reports appetite at 75% and energy level at 50%.      REVIEW OF SYSTEMS:  Review of Systems  Constitutional: Positive for fatigue.     PAST MEDICAL/SURGICAL HISTORY:  Past Medical History:  Diagnosis Date  . Cancer ([place]) 06/2018   right breast cancer  . GERD (gastroesophageal reflux disease)    occasional   . Hyperlipidemia   . Hypertension    clearance with note Dr Nevada Crane on chart  . Pneumonia    2 years ago/ states occ cough with sinus drainage- no fever  . Thyroid nodule    with biopsy- states following medically   Past Surgical History:  Procedure Laterality Date  . CATARACT EXTRACTION W/PHACO  11/30/2012   Procedure: CATARACT EXTRACTION PHACO AND INTRAOCULAR LENS PLACEMENT (IOC);  Surgeon: Williams Che, MD;  Location: [facility];  Service: Ophthalmology;  Laterality: Left;  CDE:11.49  . CATARACT EXTRACTION W/PHACO Right 03/15/2013   Procedure: CATARACT EXTRACTION PHACO AND INTRAOCULAR LENS PLACEMENT (IOC);  Surgeon: Williams Che, MD;  Location: [facility];  Service: Ophthalmology;  Laterality: Right;  CDE 16.15  . COLONOSCOPY    . EXCISION OF SKIN TAG Right 06/17/2016   Procedure: SHAVE OF SKIN TAG RIGHT BUTTOCK;  Surgeon: Aviva Signs, MD;  Location: [facility];  Service: General;  Laterality: Right;  . JOINT REPLACEMENT     left knee  . MASS EXCISION Left 06/17/2016   Procedure: EXCISION SKIN MALIGNANT LESION LEFT BUTTOCK;  Surgeon: Aviva Signs, MD;  Location: [facility];  Service: General;  Laterality: Left;  . PORTACATH PLACEMENT Right 07/28/2018   Procedure: INSERTION PORT-A-CATH WITH ULTRASOUND;  Surgeon: Rolm Bookbinder, MD;  Location: [facility];   Service: General;  Laterality: Right;  . PUNCH BIOPSY OF SKIN Right 07/28/2018   Procedure: PUNCH BIOPSY OF SKIN RIGHT BREAST;  Surgeon: Rolm Bookbinder, MD;  Location: [facility];  Service: General;  Laterality: Right;  . TOTAL KNEE ARTHROPLASTY  12/16/2011   Procedure: TOTAL KNEE ARTHROPLASTY;  Surgeon: Gearlean Alf, MD;  Location: [facility];  Service: Orthopedics;  Laterality: Right;  . TUBAL LIGATION       SOCIAL HISTORY:  Social History   Socioeconomic History  . Marital status: Married    Spouse name: Not on file  . Number of children: 2  . Years of education: some business school after HS  . Highest education level: Not on file  Occupational History  . Occupation: Cabin crew  . Occupation: Network engineer   Social Needs  . Financial resource strain: Not hard at all  . Food insecurity:    Worry: Never true    Inability: Never true  . Transportation needs:    Medical: No    Non-medical: No  Tobacco Use  . Smoking status: Never Smoker  . Smokeless tobacco: Never Used  Substance and Sexual Activity  . Alcohol use: No    Frequency: Never  . Drug use: No  . Sexual activity: Yes    Birth control/protection: None  Lifestyle  . Physical activity:    Days per week: 0 days    Minutes per session: 0 min  . Stress: Not at all  Relationships  . Social connections:    Talks on phone: More than three times a week    Gets together: More than three times a week    Attends religious service: More than 4 times per year    Active member of club or organization: Yes    Attends meetings of clubs or organizations: Never    Relationship status: Married  . Intimate partner violence:    Fear of current or ex partner: No    Emotionally abused: No    Physically abused: No    Forced sexual activity: No  Other Topics Concern  . Not on file  Social History Narrative   Lives at home with her husband.   4 cups caffeine per day.   Right-handed.    FAMILY HISTORY:  Family  History  Problem Relation Age of Onset  . Heart attack Mother   . Bronchitis Father   . Diabetes Sister   . Leukemia Brother   . Lung disease Brother   . Lung disease Sister     CURRENT MEDICATIONS:  Outpatient Encounter Medications as of 03/08/2019  Medication Sig  . acetaminophen (TYLENOL) 325 MG tablet Take 650 mg by mouth every 6 (six) hours as needed.  Marland Kitchen aspirin 325 MG tablet Take 162.5 mg by mouth daily.   . cetirizine (ZYRTEC) 10 MG tablet Take 10 mg by mouth daily.  . hydrocortisone 1 % ointment Apply 1 application topically 2 (two) times daily.  Marland Kitchen lidocaine-prilocaine (EMLA) cream Apply to port site one hour prior to appointment and cover with plastic wrap.  . Misc. Devices MISC Please provide lymphedema sleeve for right arm  . Multiple Vitamin (MULTIVITAMIN WITH MINERALS) TABS tablet Take 1 tablet by mouth daily.  . Omega-3 Fatty Acids (FISH OIL PO) Take 1 capsule by mouth daily.   Marland Kitchen PACLitaxel (TAXOL IV) Inject into the vein once a week.  . prochlorperazine (COMPAZINE) 10 MG tablet Take 1 tablet (10 mg total) by mouth every 6 (six) hours as needed (Nausea or vomiting).  . Trastuzumab (HERCEPTIN IV) Inject into the vein once a week.   Marland Kitchen UNABLE TO FIND Take 1 capsule by mouth daily. Med Name: Tumeric Curcumin   No facility-administered encounter medications on file as of 03/08/2019.     ALLERGIES:  Allergies  Allergen Reactions  . Penicillins Rash    Has patient had a PCN reaction causing immediate rash, facial/tongue/throat swelling, SOB or lightheadedness with hypotension: No Has patient had a PCN reaction causing severe rash involving mucus membranes or skin necrosis: No Has patient had a PCN reaction that required hospitalization: No Has patient had a PCN reaction occurring within the last 10 years: No If all of the above answers are "NO", then may proceed with Cephalosporin use.      PHYSICAL EXAM:  ECOG Performance status: 1  Vitals:   03/08/19 0926  BP:  (!) 143/71  Pulse: (!) 113  Resp: 18  Temp: 98 F (36.7 C)  SpO2: 100%   Filed Weights   03/08/19 0926  Weight: 177 lb (80.3 kg)    Physical Exam Vitals signs reviewed.  Constitutional:  Appearance: Normal appearance.  Cardiovascular:     Rate and Rhythm: Normal rate and regular rhythm.     Heart sounds: Normal heart sounds.  Pulmonary:     Effort: Pulmonary effort is normal.     Breath sounds: Normal breath sounds.  Abdominal:     General: There is no distension.     Palpations: Abdomen is soft. There is no mass.  Musculoskeletal:        General: No swelling.  Skin:    General: Skin is warm.  Neurological:     General: No focal deficit present.     Mental Status: She is alert and oriented to person, place, and time.  Psychiatric:        Mood and Affect: Mood normal.        Behavior: Behavior normal.   Erythema over the right breast and posterior chest wall is stable.   LABORATORY DATA:  I have reviewed the labs as listed.  CBC    Component Value Date/Time   WBC 8.0 03/08/2019 1017   RBC 4.67 03/08/2019 1017   HGB 12.7 03/08/2019 1017   HGB 12.7 11/24/2017 1626   HCT 39.0 03/08/2019 1017   HCT 36.6 11/24/2017 1626   PLT 267 03/08/2019 1017   PLT 420 (H) 11/24/2017 1626   MCV 83.5 03/08/2019 1017   MCV 88 11/24/2017 1626   MCH 27.2 03/08/2019 1017   MCHC 32.6 03/08/2019 1017   RDW 18.3 (H) 03/08/2019 1017   RDW 15.4 11/24/2017 1626   LYMPHSABS 1.9 03/08/2019 1017   LYMPHSABS 2.5 11/24/2017 1626   MONOABS 1.0 03/08/2019 1017   EOSABS 0.1 03/08/2019 1017   EOSABS 0.1 11/24/2017 1626   BASOSABS 0.1 03/08/2019 1017   BASOSABS 0.0 11/24/2017 1626   CMP Latest Ref Rng & Units 03/08/2019 02/15/2019 01/25/2019  Glucose 70 - 99 mg/dL 122(H) 112(H) 114(H)  BUN 8 - 23 mg/dL 14 14 15   Creatinine 0.44 - 1.00 mg/dL 0.96 0.87 0.96  Sodium 135 - 145 mmol/L 135 132(L) 131(L)  Potassium 3.5 - 5.1 mmol/L 3.8 3.5 3.6  Chloride 98 - 111 mmol/L 99 100 100  CO2 22 -  32 mmol/L 24 21(L) 23  Calcium 8.9 - 10.3 mg/dL 9.1 8.7(L) 8.9  Total Protein 6.5 - 8.1 g/dL 6.7 6.6 6.9  Total Bilirubin 0.3 - 1.2 mg/dL 0.7 0.8 0.7  Alkaline Phos 38 - 126 U/L 172(H) 151(H) 167(H)  AST 15 - 41 U/L 297(H) 235(H) 272(H)  ALT 0 - 44 U/L 155(H) 200(H) 274(H)       DIAGNOSTIC IMAGING:  I have independently reviewed the scans and discussed with the patient.   I have reviewed Venita Lick LPN's note and agree with the documentation.  I personally performed a face-to-face visit, made revisions and my assessment and plan is as follows.    ASSESSMENT & PLAN:   Malignant neoplasm of upper-outer quadrant of right breast in female, estrogen receptor negative (HCC) 1.  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 - Right breast biopsy at 11:30 position on 07/03/2018 shows invasive ductal carcinoma, ER/PR negative,  HER-2 positive, Ki-67 of 40%, right axillary lymph node biopsy shows benign tissue, left breast core needle biopsy at 230 o'clock position shows complex sclerosing lesion - CT chest abdomen and pelvis on 07/16/2018 showed a 6 mm nodule in the posterior left lower lobe with no other evidence of metastatic disease. -Bone scan on 07/16/2018 shows focal area of increased activity over the distal left clavicle/shoulder consistent with degenerative changes. - Skin punch biopsy of the right breast on 07/28/2018 was positive for dermal lymphatic invasion. -Echo on 07/27/2018 shows ejection fraction of 55 to 60%. -12 cycles of weekly paclitaxel and Herceptin  from 07/31/2018 through 10/30/2018.   - PET CT scan on 11/30/2018 shows small to medium sized focus of increased uptake within the right breast compatible with residual hypermetabolic tumor.  No specific findings identified to suggest FDG avid metastatic disease.  On the nondiagnostic CT portion of the exam there are several nodules identified in both lungs which are too small to reliably characterized by PET/CT.  These were not confidently identified on CT with contrast on 07/16/2018.  CT of the chest with contrast to confirm its presence was recommended.  Extensive sigmoid diverticulosis.  Multiple foci of intense uptake identified within the sigmoid colon. -Echocardiogram on 11/16/2018 shows LVEF of 60 to 65%.  - CT of the chest with contrast dated 12/24/2018 shows response to therapy of the right breast primary and axillary adenopathy.  6 mm left lower lobe pulmonary nodule on the prior exam has resolved.  There are multiple subpleural pulmonary nodules which are new.  - PET CT scan on 02/09/2019 shows local progression of the disease with enlarging and increasingly hypermetabolic right breast mass.  Multiple tiny 2-4 mm pulmonary nodules are stable compared to the prior PET scan in February.  No definitive signs of metastatic disease in the neck chest abdomen or pelvis. -Kadcyla cycle 1 at 3 mg/kg on 02/15/2019. -Physical exam today shows erythematous skin involvement of the right breast, extending into the right axillary region and posterior chest wall.  This is stable.  Mass in the right breast is also stable.  -She felt somewhat tired after the last treatment.  Denied any nausea vomiting or diarrhea.  Appetite has been good. - We reviewed labs.  LFTs went up slightly.  Bilirubin was normal. -He may proceed with cycle 2 today.  I had a prolonged discussion with the patient and her husband about recent scan findings and the rationale to change therapy. - I plan to repeat scan after cycle 3.  We will continue  at the same dose level of 3 mg/kg. -I have counseled her to not to take herbal supplements which could be causing her LFT elevation.    Total time spent is 25 minutes with more than 50% of the time spent face-to-face discussing scan results, treatment options and side effects and coordination of care.    Orders placed this encounter:  No orders of the defined types were placed in this encounter.     Derek Jack, MD [place] [phone]

## 2019-03-08 NOTE — Patient Instructions (Signed)
[facility] Discharge Instructions for Patients Receiving Chemotherapy   Beginning January 23rd 2017 lab work for the Cancer Center will be done in the  Main lab at [facility] on 1st floor. If you have a lab appointment with the Cancer Center please come in thru the  Main Entrance and check in at the main information desk   Today you received the following chemotherapy agents Kadcyla. Follow-up as scheduled. Call clinic for any questions or concerns  To help prevent nausea and vomiting after your treatment, we encourage you to take your nausea medication   If you develop nausea and vomiting, or diarrhea that is not controlled by your medication, call the clinic.  The clinic phone number is [phone]. Office hours are Monday-Friday 8:30am-5:00pm.  BELOW ARE SYMPTOMS THAT SHOULD BE REPORTED IMMEDIATELY:  *FEVER GREATER THAN 101.0 F  *CHILLS WITH OR WITHOUT FEVER  NAUSEA AND VOMITING THAT IS NOT CONTROLLED WITH YOUR NAUSEA MEDICATION  *UNUSUAL SHORTNESS OF BREATH  *UNUSUAL BRUISING OR BLEEDING  TENDERNESS IN MOUTH AND THROAT WITH OR WITHOUT PRESENCE OF ULCERS  *URINARY PROBLEMS  *BOWEL PROBLEMS  UNUSUAL RASH Items with * indicate a potential emergency and should be followed up as soon as possible. If you have an emergency after office hours please contact your primary care physician or go to the nearest emergency department.  Please call the clinic during office hours if you have any questions or concerns.   You may also contact the Patient Navigator at [phone] should you have any questions or need assistance in obtaining follow up care.      Resources For Cancer Patients and their Caregivers ? American Cancer Society: Can assist with transportation, wigs, general needs, runs Look Good Feel Better.        1-888-227-6333 ? Cancer Care: Provides financial assistance, online support groups, medication/co-pay assistance.  1-800-813-HOPE  (4673) ? [facility] Assists [place] cancer patients and their families through emotional , educational and financial support.  336-427-4357 ? [place] DSS Where to apply for food stamps, Medicaid and utility assistance. 336-342-1394 ? [facility]: Transportation to medical appointments. 336-347-2287 ? Social Security Administration: May apply for disability if have a Stage IV cancer. 336-342-7796 1-800-772-1213 ? [place] Aging, Disability and Transit Services: Assists with nutrition, care and transit needs. 336-349-2343         

## 2019-03-08 NOTE — Patient Instructions (Addendum)
[place] at [facility] Discharge Instructions  You were seen today by Dr. [name]. He went over your recent lab results. He will see you back in 3 weeks for labs and follow up. Spreading out your treatment at this time would not be a good idea, the treatment should stay on schedule early in the treatments, once you have been on the treatment for a little while we maybe able to spread them out.    Thank you for choosing [place] at [facility] to provide your oncology and hematology care.  To afford each patient quality time with our provider, please arrive at least 15 minutes before your scheduled appointment time.   If you have a lab appointment with the [facility] please come in thru the  Main Entrance and check in at the main information desk  You need to re-schedule your appointment should you arrive 10 or more minutes late.  We strive to give you quality time with our providers, and arriving late affects you and other patients whose appointments are after yours.  Also, if you no show three or more times for appointments you may be dismissed from the clinic at the providers discretion.     Again, thank you for choosing [facility].  Our hope is that these requests will decrease the amount of time that you wait before being seen by our physicians.       _____________________________________________________________  Should you have questions after your visit to [facility], please contact our office at [phone] between the hours of 8:00 a.m. and 4:30 p.m.  Voicemails left after 4:00 p.m. will not be returned until the following business day.  For prescription refill requests, have your pharmacy contact our office and allow 72 hours.    Cancer Center Support Programs:   > Cancer Support Group  2nd Tuesday of the month 1pm-2pm, Journey Room

## 2019-03-08 NOTE — Assessment & Plan Note (Addendum)
1.  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 - Right breast biopsy at 11:30 position on 07/03/2018 shows invasive ductal carcinoma, ER/PR negative, HER-2 positive, Ki-67 of 40%, right axillary lymph node biopsy shows benign tissue, left breast core needle biopsy at 230 o'clock position shows complex sclerosing lesion - CT chest abdomen and pelvis on 07/16/2018 showed a 6 mm nodule in the posterior left lower lobe with no other evidence of metastatic disease. -Bone scan on 07/16/2018 shows focal area of increased activity over the distal left clavicle/shoulder consistent with degenerative changes. - Skin punch biopsy of the right breast on 07/28/2018 was positive for dermal lymphatic invasion. -Echo on 07/27/2018 shows ejection fraction of 55 to 60%. -12 cycles of weekly paclitaxel and Herceptin from 07/31/2018 through 10/30/2018.   - PET CT scan on 11/30/2018 shows small to medium sized focus of increased uptake within the right breast compatible with residual hypermetabolic tumor.  No specific findings identified to suggest FDG avid metastatic disease.  On the nondiagnostic CT portion of the exam there are several nodules identified in both lungs which are too small to reliably characterized by PET/CT.  These were not confidently identified on CT with contrast on 07/16/2018.  CT of the chest with contrast to confirm its presence was recommended.  Extensive sigmoid diverticulosis.  Multiple foci of intense uptake identified within the sigmoid colon. -Echocardiogram on 11/16/2018 shows LVEF  of 60 to 65%.  - CT of the chest with contrast dated 12/24/2018 shows response to therapy of the right breast primary and axillary adenopathy.  6 mm left lower lobe pulmonary nodule on the prior exam has resolved.  There are multiple subpleural pulmonary nodules which are new.  - PET CT scan on 02/09/2019 shows local progression of the disease with enlarging and increasingly hypermetabolic right breast mass.  Multiple tiny 2-4 mm pulmonary nodules are stable compared to the prior PET scan in February.  No definitive signs of metastatic disease in the neck chest abdomen or pelvis. -Kadcyla cycle 1 at 3 mg/kg on 02/15/2019. -Physical exam today shows erythematous skin involvement of the right breast, extending into the right axillary region and posterior chest wall.  This is stable.  Mass in the right breast is also stable.  -She felt somewhat tired after the last treatment.  Denied any nausea vomiting or diarrhea.  Appetite has been good. - We reviewed labs.  LFTs went up slightly.  Bilirubin was normal. -He may proceed with cycle 2 today.  I had a prolonged discussion with the patient and her husband about recent scan findings and the rationale to change therapy. - I plan to repeat scan after cycle 3.  We will continue at the same dose level of 3 mg/kg. -I have counseled her to not to take herbal supplements which could be causing her LFT elevation.

## 2019-03-09 ENCOUNTER — Other Ambulatory Visit (HOSPITAL_COMMUNITY): Payer: Self-pay | Admitting: Nurse Practitioner

## 2019-03-09 DIAGNOSIS — Z171 Estrogen receptor negative status [ER-]: Secondary | ICD-10-CM

## 2019-03-09 DIAGNOSIS — C50411 Malignant neoplasm of upper-outer quadrant of right female breast: Secondary | ICD-10-CM

## 2019-03-12 ENCOUNTER — Encounter (HOSPITAL_COMMUNITY): Payer: Self-pay | Admitting: Lab

## 2019-03-12 NOTE — Progress Notes (Unsigned)
Referral sent to [facility] for 2nd opinion. Records faxxed on 5/15

## 2019-03-19 DIAGNOSIS — C50411 Malignant neoplasm of upper-outer quadrant of right female breast: Secondary | ICD-10-CM | POA: Diagnosis not present

## 2019-03-19 DIAGNOSIS — Z171 Estrogen receptor negative status [ER-]: Secondary | ICD-10-CM | POA: Diagnosis not present

## 2019-03-19 DIAGNOSIS — Z0189 Encounter for other specified special examinations: Secondary | ICD-10-CM | POA: Diagnosis not present

## 2019-03-19 DIAGNOSIS — R590 Localized enlarged lymph nodes: Secondary | ICD-10-CM | POA: Diagnosis not present

## 2019-03-19 DIAGNOSIS — R21 Rash and other nonspecific skin eruption: Secondary | ICD-10-CM | POA: Diagnosis not present

## 2019-03-24 ENCOUNTER — Other Ambulatory Visit: Payer: Self-pay

## 2019-03-24 ENCOUNTER — Ambulatory Visit (HOSPITAL_COMMUNITY)
Admission: RE | Admit: 2019-03-24 | Discharge: 2019-03-24 | Disposition: A | Discharge status: Home / Self Care | Payer: Medicare HMO | Source: Ambulatory Visit | Attending: Nurse Practitioner | Admitting: Nurse Practitioner

## 2019-03-24 DIAGNOSIS — Z171 Estrogen receptor negative status [ER-]: Secondary | ICD-10-CM

## 2019-03-24 DIAGNOSIS — C50411 Malignant neoplasm of upper-outer quadrant of right female breast: Secondary | ICD-10-CM | POA: Diagnosis not present

## 2019-03-24 DIAGNOSIS — E785 Hyperlipidemia, unspecified: Secondary | ICD-10-CM | POA: Insufficient documentation

## 2019-03-24 DIAGNOSIS — I10 Essential (primary) hypertension: Secondary | ICD-10-CM | POA: Diagnosis not present

## 2019-03-24 DIAGNOSIS — I351 Nonrheumatic aortic (valve) insufficiency: Secondary | ICD-10-CM | POA: Diagnosis not present

## 2019-03-24 DIAGNOSIS — K219 Gastro-esophageal reflux disease without esophagitis: Secondary | ICD-10-CM | POA: Insufficient documentation

## 2019-03-24 NOTE — Progress Notes (Signed)
*  PRELIMINARY RESULTS* Echocardiogram 2D Echocardiogram has been performed.  [name] 03/24/2019, 1:53 PM

## 2019-03-25 ENCOUNTER — Telehealth (HOSPITAL_COMMUNITY): Payer: Self-pay | Admitting: Hematology

## 2019-03-25 NOTE — Telephone Encounter (Signed)
Pc for both [employer] and [employer] to release pts PA for chemo. Pt did not notify us of the change so I contacted the pts husband. He stated she will be keep the appt and he was very concerned that he could not be with her due to the recent changes in her condition. I spoke with Isidoro Donning, A.D and she gave the ok to let him accompany his wife to the visit since.

## 2019-03-29 ENCOUNTER — Inpatient Hospital Stay (HOSPITAL_COMMUNITY): Payer: Medicare HMO

## 2019-03-29 ENCOUNTER — Inpatient Hospital Stay (HOSPITAL_COMMUNITY): Payer: Medicare HMO | Attending: Hematology | Admitting: Hematology

## 2019-03-29 ENCOUNTER — Encounter (HOSPITAL_COMMUNITY): Payer: Self-pay | Admitting: Hematology

## 2019-03-29 ENCOUNTER — Other Ambulatory Visit: Payer: Self-pay

## 2019-03-29 ENCOUNTER — Other Ambulatory Visit (HOSPITAL_COMMUNITY): Payer: Medicare HMO

## 2019-03-29 VITALS — BP 150/67 | HR 92 | Temp 98.6°F | Resp 18

## 2019-03-29 DIAGNOSIS — C50411 Malignant neoplasm of upper-outer quadrant of right female breast: Secondary | ICD-10-CM | POA: Diagnosis not present

## 2019-03-29 DIAGNOSIS — Z171 Estrogen receptor negative status [ER-]: Secondary | ICD-10-CM

## 2019-03-29 DIAGNOSIS — Z79899 Other long term (current) drug therapy: Secondary | ICD-10-CM | POA: Insufficient documentation

## 2019-03-29 DIAGNOSIS — Z7982 Long term (current) use of aspirin: Secondary | ICD-10-CM | POA: Diagnosis not present

## 2019-03-29 DIAGNOSIS — I10 Essential (primary) hypertension: Secondary | ICD-10-CM | POA: Insufficient documentation

## 2019-03-29 DIAGNOSIS — K219 Gastro-esophageal reflux disease without esophagitis: Secondary | ICD-10-CM | POA: Insufficient documentation

## 2019-03-29 DIAGNOSIS — E785 Hyperlipidemia, unspecified: Secondary | ICD-10-CM | POA: Insufficient documentation

## 2019-03-29 DIAGNOSIS — Z5112 Encounter for antineoplastic immunotherapy: Secondary | ICD-10-CM | POA: Diagnosis not present

## 2019-03-29 DIAGNOSIS — R918 Other nonspecific abnormal finding of lung field: Secondary | ICD-10-CM | POA: Diagnosis not present

## 2019-03-29 LAB — COMPREHENSIVE METABOLIC PANEL
ALT: 62 U/L — ABNORMAL HIGH (ref 0–44)
AST: 156 U/L — ABNORMAL HIGH (ref 15–41)
Albumin: 2.9 g/dL — ABNORMAL LOW (ref 3.5–5.0)
Alkaline Phosphatase: 174 U/L — ABNORMAL HIGH (ref 38–126)
Anion gap: 16 — ABNORMAL HIGH (ref 5–15)
BUN: 10 mg/dL (ref 8–23)
CO2: 21 mmol/L — ABNORMAL LOW (ref 22–32)
Calcium: 9.3 mg/dL (ref 8.9–10.3)
Chloride: 99 mmol/L (ref 98–111)
Creatinine, Ser: 1.08 mg/dL — ABNORMAL HIGH (ref 0.44–1.00)
GFR calc Af Amer: 53 mL/min — ABNORMAL LOW (ref >60)
GFR calc non Af Amer: 46 mL/min — ABNORMAL LOW (ref >60)
Glucose, Bld: 114 mg/dL — ABNORMAL HIGH (ref 70–99)
Potassium: 3.4 mmol/L — ABNORMAL LOW (ref 3.5–5.1)
Sodium: 136 mmol/L (ref 135–145)
Total Bilirubin: 1 mg/dL (ref 0.3–1.2)
Total Protein: 7 g/dL (ref 6.5–8.1)

## 2019-03-29 LAB — CBC WITH DIFFERENTIAL/PLATELET
Abs Immature Granulocytes: 0.02 10*3/uL (ref 0.00–0.07)
Basophils Absolute: 0.1 10*3/uL (ref 0.0–0.1)
Basophils Relative: 1 %
Eosinophils Absolute: 0.3 10*3/uL (ref 0.0–0.5)
Eosinophils Relative: 3 %
HCT: 40.3 % (ref 36.0–46.0)
Hemoglobin: 13 g/dL (ref 12.0–15.0)
Immature Granulocytes: 0 %
Lymphocytes Relative: 49 %
Lymphs Abs: 5.7 10*3/uL — ABNORMAL HIGH (ref 0.7–4.0)
MCH: 27.1 pg (ref 26.0–34.0)
MCHC: 32.3 g/dL (ref 30.0–36.0)
MCV: 84.1 fL (ref 80.0–100.0)
Monocytes Absolute: 1.3 10*3/uL — ABNORMAL HIGH (ref 0.1–1.0)
Monocytes Relative: 12 %
Neutro Abs: 4 10*3/uL (ref 1.7–7.7)
Neutrophils Relative %: 35 %
Platelets: 295 10*3/uL (ref 150–400)
RBC: 4.79 MIL/uL (ref 3.87–5.11)
RDW: 19.5 % — ABNORMAL HIGH (ref 11.5–15.5)
WBC: 11.4 10*3/uL — ABNORMAL HIGH (ref 4.0–10.5)
nRBC: 0.2 % (ref 0.0–0.2)

## 2019-03-29 MED ORDER — DIPHENHYDRAMINE HCL 25 MG PO CAPS
50.0000 mg | ORAL_CAPSULE | Freq: Once | ORAL | Status: AC
  Administered 2019-03-29: 50 mg via ORAL
  Filled 2019-03-29: qty 2

## 2019-03-29 MED ORDER — SODIUM CHLORIDE 0.9 % IV SOLN
Freq: Once | INTRAVENOUS | Status: AC
  Administered 2019-03-29: 11:00:00 via INTRAVENOUS

## 2019-03-29 MED ORDER — SODIUM CHLORIDE 0.9 % IV SOLN
3.0000 mg/kg | Freq: Once | INTRAVENOUS | Status: AC
  Administered 2019-03-29: 240 mg via INTRAVENOUS
  Filled 2019-03-29: qty 8

## 2019-03-29 MED ORDER — ACETAMINOPHEN 325 MG PO TABS
650.0000 mg | ORAL_TABLET | Freq: Once | ORAL | Status: AC
  Administered 2019-03-29: 650 mg via ORAL
  Filled 2019-03-29: qty 2

## 2019-03-29 MED ORDER — HEPARIN SOD (PORK) LOCK FLUSH 100 UNIT/ML IV SOLN
500.0000 [IU] | Freq: Once | INTRAVENOUS | Status: AC | PRN
  Administered 2019-03-29: 500 [IU]

## 2019-03-29 MED ORDER — SODIUM CHLORIDE 0.9% FLUSH
10.0000 mL | INTRAVENOUS | Status: DC | PRN
  Administered 2019-03-29: 10 mL
  Filled 2019-03-29: qty 10

## 2019-03-29 NOTE — Progress Notes (Signed)
Patient seen by Dr. [name] with lab review and ok to treat verbal order.   Patient tolerated chemotherapy with no complaints voiced.  Port site clean and dry with no bruising or swelling noted at site.  No blood return noted before and after administration of chemotherapy.  Patient denied pain with flush.  Band aid applied.  Patient left ambulatory with VSS and no s/s of distress noted.

## 2019-03-29 NOTE — Patient Instructions (Addendum)
[facility] at [facility] Discharge Instructions  You were seen today by Dr. Katragadda. He went over your recent lab results. He will see you back in 3 weeks for labs and follow up.   Thank you for choosing [facility] at [facility] to provide your oncology and hematology care.  To afford each patient quality time with our provider, please arrive at least 15 minutes before your scheduled appointment time.   If you have a lab appointment with the Cancer Center please come in thru the  Main Entrance and check in at the main information desk  You need to re-schedule your appointment should you arrive 10 or more minutes late.  We strive to give you quality time with our providers, and arriving late affects you and other patients whose appointments are after yours.  Also, if you no show three or more times for appointments you may be dismissed from the clinic at the providers discretion.     Again, thank you for choosing [facility].  Our hope is that these requests will decrease the amount of time that you wait before being seen by our physicians.       _____________________________________________________________  Should you have questions after your visit to  Cancer Center, please contact our office at [phone] between the hours of 8:00 a.m. and 4:30 p.m.  Voicemails left after 4:00 p.m. will not be returned until the following business day.  For prescription refill requests, have your pharmacy contact our office and allow 72 hours.    Cancer Center Support Programs:   > Cancer Support Group  2nd Tuesday of the month 1pm-2pm, Journey Room    

## 2019-03-29 NOTE — Assessment & Plan Note (Addendum)
1.  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 - Right breast biopsy at 11:30 position on 07/03/2018 shows invasive ductal carcinoma, ER/PR negative, HER-2 positive, Ki-67 of 40%, right axillary lymph node biopsy shows benign tissue, left breast core needle biopsy at 230 o'clock position shows complex sclerosing lesion - CT chest abdomen and pelvis on 07/16/2018 showed a 6 mm nodule in the posterior left lower lobe with no other evidence of metastatic disease. -Bone scan on 07/16/2018 shows focal area of increased activity over the distal left clavicle/shoulder consistent with degenerative changes. - Skin punch biopsy of the right breast on 07/28/2018 was positive for dermal lymphatic invasion. -Echo on 07/27/2018 shows ejection fraction of 55 to 60%. -12 cycles of weekly paclitaxel and Herceptin from 07/31/2018 through 10/30/2018.   - PET CT scan on 11/30/2018 shows small to medium sized focus of increased uptake within the right breast compatible with residual hypermetabolic tumor.  No specific findings identified to suggest FDG avid metastatic disease.  On the nondiagnostic CT portion of the exam there are several nodules identified in both lungs which are too small to reliably characterized by PET/CT.  These were not confidently identified on CT with contrast on 07/16/2018.  CT of the chest with contrast to confirm its presence was recommended.  Extensive sigmoid diverticulosis.  Multiple foci of intense uptake identified within the sigmoid colon. - CT of the chest with contrast dated  12/24/2018 shows response to therapy of the right breast primary and axillary adenopathy.  6 mm left lower lobe pulmonary nodule on the prior exam has resolved.  There are multiple subpleural pulmonary nodules which are new.  - PET CT scan on 02/09/2019 shows local progression of the disease with enlarging and increasingly hypermetabolic right breast mass.  Multiple tiny 2-4 mm pulmonary nodules are stable compared to the prior PET scan in February.  No definitive signs of metastatic disease in the neck chest abdomen or pelvis. -2 cycles of Kadcyla from 02/15/2019 through 03/08/2019. -2D echocardiogram on 03/24/2019 shows EF of 60 to 65%. -Physical exam today shows erythematous skin involvement of the right breast, extending into the right axillary region and posterior chest wall is improved.  Mass in the right breast also improved.  -Denied any nausea or vomiting or diarrhea.  She tolerated her last treatment very well.  She apparently had a second opinion from Dr. Ribakov at [facility]. - We reviewed her blood work.  LFTs continuing to improve.  Bilirubin is normal. -She may proceed with cycle 3 today.  We will plan to arrange PET scan prior to next visit.   

## 2019-03-29 NOTE — Progress Notes (Signed)
[place], [place] [address]   CLINIC:  Medical Oncology/Hematology  PCP:  [name], MD [address] [place] [address] [phone]   REASON FOR VISIT:  Follow-up for breast cancer   BRIEF ONCOLOGIC HISTORY:    Malignant neoplasm of upper-outer quadrant of right breast in female, estrogen receptor negative ([place])   07/03/2018 Initial Diagnosis    Palpable right breast mass for several months with skin discoloration, clinically skin trabecular thickening with nipple retraction measuring 7.2 x 4.3 x 4.2 cm, additional mass 4 o'clock position 2.6 cm, right axillary tail 1.2 cm right axillary lymph node 1.8 cm left breast irregular mass 1 cm, adjacent mass 0.9 cm together measuring 1.9 cm, no lymphadenopathy    07/03/2018 Pathology Results    Right breast biopsy: IDC grade 2, ER 0%, PR 0%, HER-2 positive, Ki-67 40%, lymph node biopsy negative, left breast biopsy complex sclerosing lesion     07/15/2018 Cancer Staging    Staging form: Breast, AJCC 8th Edition - Clinical: Stage IIB (cT3, cN0, cM0, G2, ER-, PR-, HER2+) - Signed by [name], MD on 07/15/2018    07/31/2018 - 10/30/2018 Chemotherapy    The patient had trastuzumab (HERCEPTIN) 336 mg in sodium chloride 0.9 % 250 mL chemo infusion, 4 mg/kg = 336 mg, Intravenous,  Once, 4 of 4 cycles Administration: 336 mg (07/31/2018), 168 mg (08/07/2018), 168 mg (08/28/2018), 168 mg (08/14/2018), 168 mg (08/21/2018), 168 mg (09/04/2018), 168 mg (09/11/2018), 168 mg (09/18/2018), 168 mg (10/02/2018), 168 mg (10/09/2018), 168 mg (10/16/2018), 168 mg (10/23/2018), 168 mg (10/30/2018) PACLitaxel (TAXOL) 78 mg in sodium chloride 0.9 % 250 mL chemo infusion (</= 79m/m2), 40 mg/m2 = 78 mg (50 % of original dose 80 mg/m2), Intravenous,  Once, 4 of 4 cycles Dose modification: 40 mg/m2 (50 % of original dose 80 mg/m2, Cycle 1, Reason: Patient Age), 53.3333 mg/m2 (66.7 % of original dose 80 mg/m2, Cycle 1, Reason:  Provider Judgment), 45 mg/m2 (original dose 80 mg/m2, Cycle 3, Reason: Provider Judgment) Administration: 78 mg (07/31/2018), 102 mg (08/07/2018), 102 mg (08/28/2018), 102 mg (08/14/2018), 102 mg (08/21/2018), 102 mg (09/04/2018), 102 mg (09/18/2018), 84 mg (10/02/2018), 84 mg (10/09/2018), 84 mg (10/16/2018), 84 mg (10/23/2018), 84 mg (10/30/2018)  for chemotherapy treatment.     11/13/2018 - 02/14/2019 Chemotherapy    The patient had trastuzumab (HERCEPTIN) 504 mg in sodium chloride 0.9 % 250 mL chemo infusion, 6 mg/kg = 504 mg (100 % of original dose 6 mg/kg), Intravenous,  Once, 4 of 5 cycles Dose modification: 6 mg/kg (original dose 6 mg/kg, Cycle 1, Reason: Other (see comments), Comment: pt receving it ) Administration: 504 mg (11/13/2018), 504 mg (12/04/2018), 504 mg (01/04/2019), 504 mg (01/25/2019)  for chemotherapy treatment.     02/15/2019 -  Chemotherapy    The patient had ado-trastuzumab emtansine (KADCYLA) 240 mg in sodium chloride 0.9 % 250 mL chemo infusion, 3 mg/kg = 240 mg (100 % of original dose 3 mg/kg), Intravenous, Once, 3 of 5 cycles Dose modification: 3 mg/kg (original dose 3 mg/kg, Cycle 1, Reason: Provider Judgment), 3 mg/kg (original dose 3 mg/kg, Cycle 2, Reason: Provider Judgment) Administration: 240 mg (02/15/2019), 240 mg (03/08/2019)  for chemotherapy treatment.       CANCER STAGING: Cancer Staging Malignant neoplasm of upper-outer quadrant of right breast in female, estrogen receptor negative ([place] Staging form: Breast, AJCC 8th Edition - Clinical: Stage IIB (cT3, cN0, cM0, G2, ER-, PR-, HER2+) -  Signed by [name], MD on 07/15/2018    INTERVAL HISTORY:  Ms. Munroe 85 y.o. female returns for routine follow-up and consideration for next cycle of chemotherapy. She is here today with her husband. She states that she has been doing well since her last visit. Denies any nausea, vomiting, or diarrhea. Denies any new pains. Had not noticed any recent bleeding such as  epistaxis, hematuria or hematochezia. Denies recent chest pain on exertion, shortness of breath on minimal exertion, pre-syncopal episodes, or palpitations. Denies any numbness or tingling in hands or feet. Denies any recent fevers, infections, or recent hospitalizations. Patient reports appetite at 100% and energy level at 50%.   REVIEW OF SYSTEMS:  Review of Systems  All other systems reviewed and are negative.    PAST MEDICAL/SURGICAL HISTORY:  Past Medical History:  Diagnosis Date  . Cancer ([facility]) 06/2018   right breast cancer  . GERD (gastroesophageal reflux disease)    occasional   . Hyperlipidemia   . Hypertension    clearance with note Dr Nevada Crane on chart  . Pneumonia    2 years ago/ states occ cough with sinus drainage- no fever  . Thyroid nodule    with biopsy- states following medically   Past Surgical History:  Procedure Laterality Date  . CATARACT EXTRACTION W/PHACO  11/30/2012   Procedure: CATARACT EXTRACTION PHACO AND INTRAOCULAR LENS PLACEMENT (IOC);  Surgeon: Williams Che, MD;  Location: [facility];  Service: Ophthalmology;  Laterality: Left;  CDE:11.49  . CATARACT EXTRACTION W/PHACO Right 03/15/2013   Procedure: CATARACT EXTRACTION PHACO AND INTRAOCULAR LENS PLACEMENT (IOC);  Surgeon: Williams Che, MD;  Location: [facility];  Service: Ophthalmology;  Laterality: Right;  CDE 16.15  . COLONOSCOPY    . EXCISION OF SKIN TAG Right 06/17/2016   Procedure: SHAVE OF SKIN TAG RIGHT BUTTOCK;  Surgeon: Aviva Signs, MD;  Location: [facility];  Service: General;  Laterality: Right;  . JOINT REPLACEMENT     left knee  . MASS EXCISION Left 06/17/2016   Procedure: EXCISION SKIN MALIGNANT LESION LEFT BUTTOCK;  Surgeon: Aviva Signs, MD;  Location: [facility];  Service: General;  Laterality: Left;  . PORTACATH PLACEMENT Right 07/28/2018   Procedure: INSERTION PORT-A-CATH WITH ULTRASOUND;  Surgeon: Rolm Bookbinder, MD;  Location: [facility];  Service: General;  Laterality:  Right;  . PUNCH BIOPSY OF SKIN Right 07/28/2018   Procedure: PUNCH BIOPSY OF SKIN RIGHT BREAST;  Surgeon: Rolm Bookbinder, MD;  Location: [facility];  Service: General;  Laterality: Right;  . TOTAL KNEE ARTHROPLASTY  12/16/2011   Procedure: TOTAL KNEE ARTHROPLASTY;  Surgeon: Gearlean Alf, MD;  Location: [facility];  Service: Orthopedics;  Laterality: Right;  . TUBAL LIGATION       SOCIAL HISTORY:  Social History   Socioeconomic History  . Marital status: Married    Spouse name: Not on file  . Number of children: 2  . Years of education: some business school after HS  . Highest education level: Not on file  Occupational History  . Occupation: Cabin crew  . Occupation: Network engineer   Social Needs  . Financial resource strain: Not hard at all  . Food insecurity:    Worry: Never true    Inability: Never true  . Transportation needs:    Medical: No    Non-medical: No  Tobacco Use  . Smoking status: Never Smoker  . Smokeless tobacco: Never Used  Substance and Sexual Activity  . Alcohol use: No  Frequency: Never  . Drug use: No  . Sexual activity: Yes    Birth control/protection: None  Lifestyle  . Physical activity:    Days per week: 0 days    Minutes per session: 0 min  . Stress: Not at all  Relationships  . Social connections:    Talks on phone: More than three times a week    Gets together: More than three times a week    Attends religious service: More than 4 times per year    Active member of club or organization: Yes    Attends meetings of clubs or organizations: Never    Relationship status: Married  . Intimate partner violence:    Fear of current or ex partner: No    Emotionally abused: No    Physically abused: No    Forced sexual activity: No  Other Topics Concern  . Not on file  Social History Narrative   Lives at home with her husband.   4 cups caffeine per day.   Right-handed.    FAMILY HISTORY:  Family History  Problem Relation Age  of Onset  . Heart attack Mother   . Bronchitis Father   . Diabetes Sister   . Leukemia Brother   . Lung disease Brother   . Lung disease Sister     CURRENT MEDICATIONS:  Outpatient Encounter Medications as of 03/29/2019  Medication Sig  . acetaminophen (TYLENOL) 325 MG tablet Take 650 mg by mouth every 6 (six) hours as needed.  Marland Kitchen aspirin 325 MG tablet Take 162.5 mg by mouth daily.   . cetirizine (ZYRTEC) 10 MG tablet Take 10 mg by mouth daily.  . hydrocortisone 1 % ointment Apply 1 application topically 2 (two) times daily.  Marland Kitchen lidocaine-prilocaine (EMLA) cream Apply to port site one hour prior to appointment and cover with plastic wrap.  . Misc. Devices MISC Please provide lymphedema sleeve for right arm  . Multiple Vitamin (MULTIVITAMIN WITH MINERALS) TABS tablet Take 1 tablet by mouth daily.  . Omega-3 Fatty Acids (FISH OIL PO) Take 1 capsule by mouth daily.   Marland Kitchen PACLitaxel (TAXOL IV) Inject into the vein once a week.  . prochlorperazine (COMPAZINE) 10 MG tablet Take 1 tablet (10 mg total) by mouth every 6 (six) hours as needed (Nausea or vomiting).  . Trastuzumab (HERCEPTIN IV) Inject into the vein once a week.   Marland Kitchen UNABLE TO FIND Take 1 capsule by mouth daily. Med Name: Tumeric Curcumin   No facility-administered encounter medications on file as of 03/29/2019.     ALLERGIES:  Allergies  Allergen Reactions  . Penicillins Rash    Has patient had a PCN reaction causing immediate rash, facial/tongue/throat swelling, SOB or lightheadedness with hypotension: No Has patient had a PCN reaction causing severe rash involving mucus membranes or skin necrosis: No Has patient had a PCN reaction that required hospitalization: No Has patient had a PCN reaction occurring within the last 10 years: No If all of the above answers are "NO", then may proceed with Cephalosporin use.      PHYSICAL EXAM:  ECOG Performance status: 1  Vitals:   03/29/19 0934  BP: (!) 147/74  Pulse: (!) 112  Resp:  18  Temp: 98.6 F (37 C)  SpO2: 99%   Filed Weights   03/29/19 0934  Weight: 173 lb 11.2 oz (78.8 kg)    Physical Exam Vitals signs reviewed.  Constitutional:      Appearance: Normal appearance.  Cardiovascular:  Rate and Rhythm: Normal rate and regular rhythm.     Heart sounds: Normal heart sounds.  Pulmonary:     Effort: Pulmonary effort is normal.     Breath sounds: Normal breath sounds.  Abdominal:     General: There is no distension.     Palpations: Abdomen is soft. There is no mass.  Musculoskeletal:        General: No swelling.  Skin:    General: Skin is warm.  Neurological:     General: No focal deficit present.     Mental Status: She is alert and oriented to person, place, and time.  Psychiatric:        Mood and Affect: Mood normal.        Behavior: Behavior normal.    Breast exam shows improvement in erythema and mass of the right breast as well as rash on the posterior chest wall.  LABORATORY DATA:  I have reviewed the labs as listed.  CBC    Component Value Date/Time   WBC 11.4 (H) 03/29/2019 0857   RBC 4.79 03/29/2019 0857   HGB 13.0 03/29/2019 0857   HGB 12.7 11/24/2017 1626   HCT 40.3 03/29/2019 0857   HCT 36.6 11/24/2017 1626   PLT 295 03/29/2019 0857   PLT 420 (H) 11/24/2017 1626   MCV 84.1 03/29/2019 0857   MCV 88 11/24/2017 1626   MCH 27.1 03/29/2019 0857   MCHC 32.3 03/29/2019 0857   RDW 19.5 (H) 03/29/2019 0857   RDW 15.4 11/24/2017 1626   LYMPHSABS 5.7 (H) 03/29/2019 0857   LYMPHSABS 2.5 11/24/2017 1626   MONOABS 1.3 (H) 03/29/2019 0857   EOSABS 0.3 03/29/2019 0857   EOSABS 0.1 11/24/2017 1626   BASOSABS 0.1 03/29/2019 0857   BASOSABS 0.0 11/24/2017 1626   CMP Latest Ref Rng & Units 03/29/2019 03/08/2019 02/15/2019  Glucose 70 - 99 mg/dL 114(H) 122(H) 112(H)  BUN 8 - 23 mg/dL 10 14 14   Creatinine 0.44 - 1.00 mg/dL 1.08(H) 0.96 0.87  Sodium 135 - 145 mmol/L 136 135 132(L)  Potassium 3.5 - 5.1 mmol/L 3.4(L) 3.8 3.5  Chloride 98  - 111 mmol/L 99 99 100  CO2 22 - 32 mmol/L 21(L) 24 21(L)  Calcium 8.9 - 10.3 mg/dL 9.3 9.1 8.7(L)  Total Protein 6.5 - 8.1 g/dL 7.0 6.7 6.6  Total Bilirubin 0.3 - 1.2 mg/dL 1.0 0.7 0.8  Alkaline Phos 38 - 126 U/L 174(H) 172(H) 151(H)  AST 15 - 41 U/L 156(H) 297(H) 235(H)  ALT 0 - 44 U/L 62(H) 155(H) 200(H)       DIAGNOSTIC IMAGING:  I have independently reviewed the scans and discussed with the patient.   I have reviewed Venita Lick LPN's note and agree with the documentation.  I personally performed a face-to-face visit, made revisions and my assessment and plan is as follows.    ASSESSMENT & PLAN:   Malignant neoplasm of upper-outer quadrant of right breast in female, estrogen receptor negative ([facility]) 1.  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 - Right breast biopsy at 11:30 position on 07/03/2018 shows invasive  ductal carcinoma, ER/PR negative, HER-2 positive, Ki-67 of 40%, right axillary lymph node biopsy shows benign tissue, left breast core needle biopsy at 230 o'clock position shows complex sclerosing lesion - CT chest abdomen and pelvis on 07/16/2018 showed a 6 mm nodule in the posterior left lower lobe with no other evidence of metastatic disease. -Bone scan on 07/16/2018 shows focal area of increased activity over the distal left clavicle/shoulder consistent with degenerative changes. - Skin punch biopsy of the right breast on 07/28/2018 was positive for dermal lymphatic invasion. -Echo on 07/27/2018 shows ejection fraction of 55 to 60%. -12 cycles  of weekly paclitaxel and Herceptin from 07/31/2018 through 10/30/2018.   - PET CT scan on 11/30/2018 shows small to medium sized focus of increased uptake within the right breast compatible with residual hypermetabolic tumor.  No specific findings identified to suggest FDG avid metastatic disease.  On the nondiagnostic CT portion of the exam there are several nodules identified in both lungs which are too small to reliably characterized by PET/CT.  These were not confidently identified on CT with contrast on 07/16/2018.  CT of the chest with contrast to confirm its presence was recommended.  Extensive sigmoid diverticulosis.  Multiple foci of intense uptake identified within the sigmoid colon. - CT of the chest with contrast dated 12/24/2018 shows response to therapy of the right breast primary and axillary adenopathy.  6 mm left lower lobe pulmonary nodule on the prior exam has resolved.  There are multiple subpleural pulmonary nodules which are new.  - PET CT scan on 02/09/2019 shows local progression of the disease with enlarging and increasingly hypermetabolic right breast mass.  Multiple tiny 2-4 mm pulmonary nodules are stable compared to the prior PET scan in February.  No definitive signs of metastatic disease in the neck chest abdomen or pelvis. -2 cycles of Kadcyla from 02/15/2019 through 03/08/2019. -2D echocardiogram on 03/24/2019 shows EF of 60 to 65%. -Physical exam today shows erythematous skin involvement of the right breast, extending into the right axillary region and posterior chest wall is improved.  Mass in the right breast also improved.  -Denied any nausea or vomiting or diarrhea.  She tolerated her last treatment very well.  She apparently had a second opinion from Dr. Conley Simmonds at [facility]. - We reviewed her blood work.  LFTs continuing to improve.  Bilirubin is normal. -She may proceed with cycle 3 today.  We will plan to arrange PET scan prior to next visit.    Total time spent  is 25 minutes with more than 50% of the time spent face-to-face discussing treatment plan and coordination of care.    Orders placed this encounter:  Orders Placed This Encounter  Procedures  . NM PET Image Restag (PS) Skull Base To Thigh      Derek Jack, MD [place] [phone]

## 2019-04-15 ENCOUNTER — Other Ambulatory Visit: Payer: Self-pay

## 2019-04-15 ENCOUNTER — Encounter (HOSPITAL_COMMUNITY)
Admission: RE | Admit: 2019-04-15 | Discharge: 2019-04-15 | Disposition: A | Discharge status: Home / Self Care | Payer: Medicare HMO | Source: Ambulatory Visit | Attending: Hematology | Admitting: Hematology

## 2019-04-15 DIAGNOSIS — K449 Diaphragmatic hernia without obstruction or gangrene: Secondary | ICD-10-CM | POA: Diagnosis not present

## 2019-04-15 DIAGNOSIS — Z171 Estrogen receptor negative status [ER-]: Secondary | ICD-10-CM | POA: Diagnosis present

## 2019-04-15 DIAGNOSIS — C50411 Malignant neoplasm of upper-outer quadrant of right female breast: Secondary | ICD-10-CM | POA: Diagnosis not present

## 2019-04-15 DIAGNOSIS — K8020 Calculus of gallbladder without cholecystitis without obstruction: Secondary | ICD-10-CM | POA: Diagnosis not present

## 2019-04-15 DIAGNOSIS — K5730 Diverticulosis of large intestine without perforation or abscess without bleeding: Secondary | ICD-10-CM | POA: Diagnosis not present

## 2019-04-15 DIAGNOSIS — I2510 Atherosclerotic heart disease of native coronary artery without angina pectoris: Secondary | ICD-10-CM | POA: Insufficient documentation

## 2019-04-15 DIAGNOSIS — I700 Atherosclerosis of aorta: Secondary | ICD-10-CM | POA: Diagnosis not present

## 2019-04-15 DIAGNOSIS — C50911 Malignant neoplasm of unspecified site of right female breast: Secondary | ICD-10-CM | POA: Diagnosis not present

## 2019-04-15 DIAGNOSIS — E041 Nontoxic single thyroid nodule: Secondary | ICD-10-CM | POA: Diagnosis not present

## 2019-04-15 LAB — GLUCOSE, CAPILLARY: Glucose-Capillary: 123 mg/dL — ABNORMAL HIGH (ref 70–99)

## 2019-04-15 MED ORDER — FLUDEOXYGLUCOSE F - 18 (FDG) INJECTION
9.0000 | Freq: Once | INTRAVENOUS | Status: AC | PRN
  Administered 2019-04-15: 9 via INTRAVENOUS

## 2019-04-19 ENCOUNTER — Inpatient Hospital Stay (HOSPITAL_COMMUNITY): Payer: Medicare HMO | Attending: Hematology

## 2019-04-19 ENCOUNTER — Inpatient Hospital Stay (HOSPITAL_COMMUNITY): Payer: Medicare HMO

## 2019-04-19 ENCOUNTER — Inpatient Hospital Stay (HOSPITAL_BASED_OUTPATIENT_CLINIC_OR_DEPARTMENT_OTHER): Payer: Medicare HMO | Admitting: Hematology

## 2019-04-19 ENCOUNTER — Encounter (HOSPITAL_COMMUNITY): Payer: Self-pay | Admitting: Hematology

## 2019-04-19 ENCOUNTER — Other Ambulatory Visit: Payer: Self-pay

## 2019-04-19 VITALS — BP 145/70 | HR 93 | Temp 97.8°F | Resp 18

## 2019-04-19 DIAGNOSIS — I10 Essential (primary) hypertension: Secondary | ICD-10-CM

## 2019-04-19 DIAGNOSIS — Z171 Estrogen receptor negative status [ER-]: Secondary | ICD-10-CM

## 2019-04-19 DIAGNOSIS — Z7982 Long term (current) use of aspirin: Secondary | ICD-10-CM

## 2019-04-19 DIAGNOSIS — C50411 Malignant neoplasm of upper-outer quadrant of right female breast: Secondary | ICD-10-CM | POA: Diagnosis not present

## 2019-04-19 DIAGNOSIS — Z170 Estrogen receptor positive status [ER+]: Secondary | ICD-10-CM | POA: Insufficient documentation

## 2019-04-19 DIAGNOSIS — Z79899 Other long term (current) drug therapy: Secondary | ICD-10-CM | POA: Diagnosis not present

## 2019-04-19 DIAGNOSIS — E785 Hyperlipidemia, unspecified: Secondary | ICD-10-CM | POA: Diagnosis not present

## 2019-04-19 DIAGNOSIS — K219 Gastro-esophageal reflux disease without esophagitis: Secondary | ICD-10-CM | POA: Diagnosis not present

## 2019-04-19 DIAGNOSIS — Z5112 Encounter for antineoplastic immunotherapy: Secondary | ICD-10-CM

## 2019-04-19 DIAGNOSIS — R918 Other nonspecific abnormal finding of lung field: Secondary | ICD-10-CM | POA: Diagnosis not present

## 2019-04-19 LAB — CBC WITH DIFFERENTIAL/PLATELET
Abs Immature Granulocytes: 0.02 10*3/uL (ref 0.00–0.07)
Basophils Absolute: 0.1 10*3/uL (ref 0.0–0.1)
Basophils Relative: 1 %
Eosinophils Absolute: 0.3 10*3/uL (ref 0.0–0.5)
Eosinophils Relative: 3 %
HCT: 38.7 % (ref 36.0–46.0)
Hemoglobin: 12.5 g/dL (ref 12.0–15.0)
Immature Granulocytes: 0 %
Lymphocytes Relative: 44 %
Lymphs Abs: 4.3 10*3/uL — ABNORMAL HIGH (ref 0.7–4.0)
MCH: 27.7 pg (ref 26.0–34.0)
MCHC: 32.3 g/dL (ref 30.0–36.0)
MCV: 85.6 fL (ref 80.0–100.0)
Monocytes Absolute: 1.1 10*3/uL — ABNORMAL HIGH (ref 0.1–1.0)
Monocytes Relative: 11 %
Neutro Abs: 3.9 10*3/uL (ref 1.7–7.7)
Neutrophils Relative %: 41 %
Platelets: 298 10*3/uL (ref 150–400)
RBC: 4.52 MIL/uL (ref 3.87–5.11)
RDW: 19.8 % — ABNORMAL HIGH (ref 11.5–15.5)
WBC: 9.7 10*3/uL (ref 4.0–10.5)
nRBC: 0 % (ref 0.0–0.2)

## 2019-04-19 LAB — COMPREHENSIVE METABOLIC PANEL
ALT: 53 U/L — ABNORMAL HIGH (ref 0–44)
AST: 120 U/L — ABNORMAL HIGH (ref 15–41)
Albumin: 2.8 g/dL — ABNORMAL LOW (ref 3.5–5.0)
Alkaline Phosphatase: 147 U/L — ABNORMAL HIGH (ref 38–126)
Anion gap: 11 (ref 5–15)
BUN: 13 mg/dL (ref 8–23)
CO2: 24 mmol/L (ref 22–32)
Calcium: 9.2 mg/dL (ref 8.9–10.3)
Chloride: 103 mmol/L (ref 98–111)
Creatinine, Ser: 1.17 mg/dL — ABNORMAL HIGH (ref 0.44–1.00)
GFR calc Af Amer: 49 mL/min — ABNORMAL LOW (ref >60)
GFR calc non Af Amer: 42 mL/min — ABNORMAL LOW (ref >60)
Glucose, Bld: 112 mg/dL — ABNORMAL HIGH (ref 70–99)
Potassium: 3.8 mmol/L (ref 3.5–5.1)
Sodium: 138 mmol/L (ref 135–145)
Total Bilirubin: 0.9 mg/dL (ref 0.3–1.2)
Total Protein: 7 g/dL (ref 6.5–8.1)

## 2019-04-19 MED ORDER — DIPHENHYDRAMINE HCL 25 MG PO CAPS
ORAL_CAPSULE | ORAL | Status: AC
  Filled 2019-04-19: qty 2

## 2019-04-19 MED ORDER — SODIUM CHLORIDE 0.9 % IV SOLN
Freq: Once | INTRAVENOUS | Status: AC
  Administered 2019-04-19: 10:00:00 via INTRAVENOUS

## 2019-04-19 MED ORDER — SODIUM CHLORIDE 0.9 % IV SOLN
3.0000 mg/kg | Freq: Once | INTRAVENOUS | Status: AC
  Administered 2019-04-19: 240 mg via INTRAVENOUS
  Filled 2019-04-19: qty 8

## 2019-04-19 MED ORDER — SODIUM CHLORIDE 0.9% FLUSH
10.0000 mL | INTRAVENOUS | Status: DC | PRN
  Administered 2019-04-19: 12:00:00 10 mL
  Filled 2019-04-19: qty 10

## 2019-04-19 MED ORDER — ACETAMINOPHEN 325 MG PO TABS
ORAL_TABLET | ORAL | Status: AC
  Filled 2019-04-19: qty 2

## 2019-04-19 MED ORDER — ACETAMINOPHEN 325 MG PO TABS
650.0000 mg | ORAL_TABLET | Freq: Once | ORAL | Status: AC
  Administered 2019-04-19: 650 mg via ORAL

## 2019-04-19 MED ORDER — DIPHENHYDRAMINE HCL 25 MG PO CAPS
50.0000 mg | ORAL_CAPSULE | Freq: Once | ORAL | Status: AC
  Administered 2019-04-19: 50 mg via ORAL

## 2019-04-19 MED ORDER — HEPARIN SOD (PORK) LOCK FLUSH 100 UNIT/ML IV SOLN
500.0000 [IU] | Freq: Once | INTRAVENOUS | Status: AC | PRN
  Administered 2019-04-19: 500 [IU]

## 2019-04-19 NOTE — Patient Instructions (Addendum)
[place] at [facility]  Discharge Instructions: Follow up in 3 weeks with labs and treatment.   You saw Dr. [name] today. _______________________________________________________________  Thank you for choosing [place] at [facility] to provide your oncology and hematology care.  To afford each patient quality time with our providers, please arrive at least 15 minutes before your scheduled appointment.  You need to re-schedule your appointment if you arrive 10 or more minutes late.  We strive to give you quality time with our providers, and arriving late affects you and other patients whose appointments are after yours.  Also, if you no show three or more times for appointments you may be dismissed from the clinic.  Again, thank you for choosing [place] at [place] hope is that these requests will allow you access to exceptional care and in a timely manner. _______________________________________________________________  If you have questions after your visit, please contact our office at [phone] between the hours of 8:30 a.m. and 5:00 p.m. Voicemails left after 4:30 p.m. will not be returned until the following business day. _______________________________________________________________  For prescription refill requests, have your pharmacy contact our office. _______________________________________________________________  Recommendations made by the consultant and any test results will be sent to your referring physician. _______________________________________________________________

## 2019-04-19 NOTE — Assessment & Plan Note (Addendum)
1.  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 -12 cycles of weekly Taxol and Herceptin from 07/31/2018 through 10/30/2018. - Skin punch biopsy of the right breast on 07/28/2018 was positive for dermal lymphatic invasion. -Echo on 07/27/2018 shows ejection fraction of 55 to 60%. -12 cycles of weekly paclitaxel and Herceptin from 07/31/2018 through 10/30/2018.   - PET scan on 02/09/2019 shows local progression of the disease with enlarging and increasing hypermetabolic right breast mass.  Multiple tiny 2-4 mm pulmonary nodules are stable.  No definitive signs of metastatic disease elsewhere. -3 cycles of Kadcyla from 02/15/2019 through 03/29/2019. -2D echocardiogram on 03/24/2019 shows EF of 60 to 65%. -Physical exam today reveals very good improvement in the erythema over the breast and posterior chest wall. - We reviewed results of the PET scan dated 04/15/2019 which showed persistent intense focal right breast hypermetabolism, slightly increased.  However no evidence of metastatic disease. - I have recommended continuing Kadcyla until progression or severe intolerance.  She seems to be tolerating it very well. - She may proceed with cycle 4 today.  We will see her back in 3 weeks for follow-up.  2.  High risk drug monitoring: - Last 2D echocardiogram on 03/24/2019 shows EF of 60 to 65%. -We will continue to monitor her ejection fraction periodically.

## 2019-04-19 NOTE — Patient Instructions (Signed)
[facility] Discharge Instructions for Patients Receiving Chemotherapy  Today you received the following chemotherapy agents   To help prevent nausea and vomiting after your treatment, we encourage you to take your nausea medication   If you develop nausea and vomiting that is not controlled by your nausea medication, call the clinic.   BELOW ARE SYMPTOMS THAT SHOULD BE REPORTED IMMEDIATELY:  *FEVER GREATER THAN 100.5 F  *CHILLS WITH OR WITHOUT FEVER  NAUSEA AND VOMITING THAT IS NOT CONTROLLED WITH YOUR NAUSEA MEDICATION  *UNUSUAL SHORTNESS OF BREATH  *UNUSUAL BRUISING OR BLEEDING  TENDERNESS IN MOUTH AND THROAT WITH OR WITHOUT PRESENCE OF ULCERS  *URINARY PROBLEMS  *BOWEL PROBLEMS  UNUSUAL RASH Items with * indicate a potential emergency and should be followed up as soon as possible.  Feel free to call the clinic should you have any questions or concerns. The clinic phone number is [phone].  Please show the CHEMO ALERT CARD at check-in to the Emergency Department and triage nurse.   

## 2019-04-19 NOTE — Progress Notes (Signed)
Message received from [facility] NP. Proceed with tx. Labs reviewed. HR elevated on arrival. Recheck 83.  Treatment given today per MD orders. Tolerated infusion without adverse affects. Vital signs stable. No complaints at this time. Discharged from clinic via wheel chair. F/U with [facility] as scheduled.

## 2019-04-19 NOTE — Progress Notes (Signed)
[address] [place], [place] [address]   CLINIC:  Medical Oncology/Hematology  PCP:  [name], MD [address] [place] [address] [phone]   REASON FOR VISIT: Follow-up for right breast cancer, ER-/PR-/HER2+  CURRENT THERAPY: Kadcyla every 3 weeks   BRIEF ONCOLOGIC HISTORY:  Oncology History  Malignant neoplasm of upper-outer quadrant of right breast in female, estrogen receptor negative ([place])  07/03/2018 Initial Diagnosis   Palpable right breast mass for several months with skin discoloration, clinically skin trabecular thickening with nipple retraction measuring 7.2 x 4.3 x 4.2 cm, additional mass 4 o'clock position 2.6 cm, right axillary tail 1.2 cm right axillary lymph node 1.8 cm left breast irregular mass 1 cm, adjacent mass 0.9 cm together measuring 1.9 cm, no lymphadenopathy   07/03/2018 Pathology Results   Right breast biopsy: IDC grade 2, ER 0%, PR 0%, HER-2 positive, Ki-67 40%, lymph node biopsy negative, left breast biopsy complex sclerosing lesion    07/15/2018 Cancer Staging   Staging form: Breast, AJCC 8th Edition - Clinical: Stage IIB (cT3, cN0, cM0, G2, ER-, PR-, HER2+) - Signed by [name], MD on 07/15/2018   07/31/2018 - 10/30/2018 Chemotherapy   The patient had trastuzumab (HERCEPTIN) 336 mg in sodium chloride 0.9 % 250 mL chemo infusion, 4 mg/kg = 336 mg, Intravenous,  Once, 4 of 4 cycles Administration: 336 mg (07/31/2018), 168 mg (08/07/2018), 168 mg (08/28/2018), 168 mg (08/14/2018), 168 mg (08/21/2018), 168 mg (09/04/2018), 168 mg (09/11/2018), 168 mg (09/18/2018), 168 mg (10/02/2018), 168 mg (10/09/2018), 168 mg (10/16/2018), 168 mg (10/23/2018), 168 mg (10/30/2018) PACLitaxel (TAXOL) 78 mg in sodium chloride 0.9 % 250 mL chemo infusion (</= 26m/m2), 40 mg/m2 = 78 mg (50 % of original dose 80 mg/m2), Intravenous,  Once, 4 of 4 cycles Dose modification: 40 mg/m2 (50 % of original dose 80 mg/m2, Cycle 1, Reason: Patient  Age), 53.3333 mg/m2 (66.7 % of original dose 80 mg/m2, Cycle 1, Reason: Provider Judgment), 45 mg/m2 (original dose 80 mg/m2, Cycle 3, Reason: Provider Judgment) Administration: 78 mg (07/31/2018), 102 mg (08/07/2018), 102 mg (08/28/2018), 102 mg (08/14/2018), 102 mg (08/21/2018), 102 mg (09/04/2018), 102 mg (09/18/2018), 84 mg (10/02/2018), 84 mg (10/09/2018), 84 mg (10/16/2018), 84 mg (10/23/2018), 84 mg (10/30/2018)  for chemotherapy treatment.    11/13/2018 - 02/14/2019 Chemotherapy   The patient had trastuzumab (HERCEPTIN) 504 mg in sodium chloride 0.9 % 250 mL chemo infusion, 6 mg/kg = 504 mg (100 % of original dose 6 mg/kg), Intravenous,  Once, 4 of 5 cycles Dose modification: 6 mg/kg (original dose 6 mg/kg, Cycle 1, Reason: Other (see comments), Comment: pt receving it ) Administration: 504 mg (11/13/2018), 504 mg (12/04/2018), 504 mg (01/04/2019), 504 mg (01/25/2019)  for chemotherapy treatment.    02/15/2019 -  Chemotherapy   The patient had ado-trastuzumab emtansine (KADCYLA) 240 mg in sodium chloride 0.9 % 250 mL chemo infusion, 3 mg/kg = 240 mg (100 % of original dose 3 mg/kg), Intravenous, Once, 4 of 6 cycles Dose modification: 3 mg/kg (original dose 3 mg/kg, Cycle 1, Reason: Provider Judgment), 3 mg/kg (original dose 3 mg/kg, Cycle 2, Reason: Provider Judgment) Administration: 240 mg (02/15/2019), 240 mg (03/08/2019), 240 mg (03/29/2019), 240 mg (04/19/2019)  for chemotherapy treatment.       CANCER STAGING: Cancer Staging Malignant neoplasm of upper-outer quadrant of right breast in female, estrogen receptor negative ([place] Staging form: Breast, AJCC 8th Edition - Clinical: Stage IIB (cT3, cN0, cM0, G2, ER-,  PR-, HER2+) - Signed by [name], MD on 07/15/2018    INTERVAL HISTORY:  Ms. Calles 85 y.o. female returns for routine follow-up for metastatic breast cancer. She reports she is walking everyday for 10 minute to help build her energy levels. She is ready for her treatment today. Denies  any nausea, vomiting, or diarrhea. Denies any new pains. Had not noticed any recent bleeding such as epistaxis, hematuria or hematochezia. Denies recent chest pain on exertion, shortness of breath on minimal exertion, pre-syncopal episodes, or palpitations. Denies any numbness or tingling in hands or feet. Denies any recent fevers, infections, or recent hospitalizations. Patient reports appetite at 100% and energy level at 50%. She is eating well and maintaining her weight at this time.      REVIEW OF SYSTEMS:  Review of Systems  Constitutional: Positive for fatigue.  Respiratory: Positive for shortness of breath (with exertion).   Skin: Positive for rash (inproved).  All other systems reviewed and are negative.    PAST MEDICAL/SURGICAL HISTORY:  Past Medical History:  Diagnosis Date  . Cancer ([facility]) 06/2018   right breast cancer  . GERD (gastroesophageal reflux disease)    occasional   . Hyperlipidemia   . Hypertension    clearance with note Dr Nevada Crane on chart  . Pneumonia    2 years ago/ states occ cough with sinus drainage- no fever  . Thyroid nodule    with biopsy- states following medically   Past Surgical History:  Procedure Laterality Date  . CATARACT EXTRACTION W/PHACO  11/30/2012   Procedure: CATARACT EXTRACTION PHACO AND INTRAOCULAR LENS PLACEMENT (IOC);  Surgeon: Williams Che, MD;  Location: [facility];  Service: Ophthalmology;  Laterality: Left;  CDE:11.49  . CATARACT EXTRACTION W/PHACO Right 03/15/2013   Procedure: CATARACT EXTRACTION PHACO AND INTRAOCULAR LENS PLACEMENT (IOC);  Surgeon: Williams Che, MD;  Location: [facility];  Service: Ophthalmology;  Laterality: Right;  CDE 16.15  . COLONOSCOPY    . EXCISION OF SKIN TAG Right 06/17/2016   Procedure: SHAVE OF SKIN TAG RIGHT BUTTOCK;  Surgeon: Aviva Signs, MD;  Location: [facility];  Service: General;  Laterality: Right;  . JOINT REPLACEMENT     left knee  . MASS EXCISION Left 06/17/2016   Procedure: EXCISION SKIN  MALIGNANT LESION LEFT BUTTOCK;  Surgeon: Aviva Signs, MD;  Location: [facility];  Service: General;  Laterality: Left;  . PORTACATH PLACEMENT Right 07/28/2018   Procedure: INSERTION PORT-A-CATH WITH ULTRASOUND;  Surgeon: Rolm Bookbinder, MD;  Location: [facility];  Service: General;  Laterality: Right;  . PUNCH BIOPSY OF SKIN Right 07/28/2018   Procedure: PUNCH BIOPSY OF SKIN RIGHT BREAST;  Surgeon: Rolm Bookbinder, MD;  Location: [facility];  Service: General;  Laterality: Right;  . TOTAL KNEE ARTHROPLASTY  12/16/2011   Procedure: TOTAL KNEE ARTHROPLASTY;  Surgeon: Gearlean Alf, MD;  Location: [facility];  Service: Orthopedics;  Laterality: Right;  . TUBAL LIGATION       SOCIAL HISTORY:  Social History   Socioeconomic History  . Marital status: Married    Spouse name: Not on file  . Number of children: 2  . Years of education: some business school after HS  . Highest education level: Not on file  Occupational History  . Occupation: Cabin crew  . Occupation: Network engineer   Social Needs  . Financial resource strain: Not hard at all  . Food insecurity    Worry: Never true    Inability: Never true  .  Transportation needs    Medical: No    Non-medical: No  Tobacco Use  . Smoking status: Never Smoker  . Smokeless tobacco: Never Used  Substance and Sexual Activity  . Alcohol use: No    Frequency: Never  . Drug use: No  . Sexual activity: Yes    Birth control/protection: None  Lifestyle  . Physical activity    Days per week: 0 days    Minutes per session: 0 min  . Stress: Not at all  Relationships  . Social connections    Talks on phone: More than three times a week    Gets together: More than three times a week    Attends religious service: More than 4 times per year    Active member of club or organization: Yes    Attends meetings of clubs or organizations: Never    Relationship status: Married  . Intimate partner violence    Fear of current or  ex partner: No    Emotionally abused: No    Physically abused: No    Forced sexual activity: No  Other Topics Concern  . Not on file  Social History Narrative   Lives at home with her husband.   4 cups caffeine per day.   Right-handed.    FAMILY HISTORY:  Family History  Problem Relation Age of Onset  . Heart attack Mother   . Bronchitis Father   . Diabetes Sister   . Leukemia Brother   . Lung disease Brother   . Lung disease Sister     CURRENT MEDICATIONS:  Outpatient Encounter Medications as of 04/19/2019  Medication Sig  . acetaminophen (TYLENOL) 325 MG tablet Take 650 mg by mouth every 6 (six) hours as needed.  Marland Kitchen aspirin 325 MG tablet Take 162.5 mg by mouth daily.   . cetirizine (ZYRTEC) 10 MG tablet Take 10 mg by mouth daily.  . hydrocortisone 1 % ointment Apply 1 application topically 2 (two) times daily.  Marland Kitchen lidocaine-prilocaine (EMLA) cream Apply to port site one hour prior to appointment and cover with plastic wrap.  . Misc. Devices MISC Please provide lymphedema sleeve for right arm  . Multiple Vitamin (MULTIVITAMIN WITH MINERALS) TABS tablet Take 1 tablet by mouth daily.  . Omega-3 Fatty Acids (FISH OIL PO) Take 1 capsule by mouth daily.   Marland Kitchen PACLitaxel (TAXOL IV) Inject into the vein once a week.  . prochlorperazine (COMPAZINE) 10 MG tablet Take 1 tablet (10 mg total) by mouth every 6 (six) hours as needed (Nausea or vomiting).  . Trastuzumab (HERCEPTIN IV) Inject into the vein once a week.   Marland Kitchen UNABLE TO FIND Take 1 capsule by mouth daily. Med Name: Tumeric Curcumin   No facility-administered encounter medications on file as of 04/19/2019.     ALLERGIES:  Allergies  Allergen Reactions  . Penicillins Rash    Has patient had a PCN reaction causing immediate rash, facial/tongue/throat swelling, SOB or lightheadedness with hypotension: No Has patient had a PCN reaction causing severe rash involving mucus membranes or skin necrosis: No Has patient had a PCN reaction  that required hospitalization: No Has patient had a PCN reaction occurring within the last 10 years: No If all of the above answers are "NO", then may proceed with Cephalosporin use.      PHYSICAL EXAM:  ECOG Performance status: 1  Vitals:   04/19/19 0853  BP: (!) 149/71  Pulse: (!) 113  Resp: 16  Temp: 98.7 F (37.1 C)  SpO2:  99%   Filed Weights   04/19/19 0853  Weight: 171 lb 11.2 oz (77.9 kg)    Physical Exam Constitutional:      Appearance: Normal appearance. She is normal weight.  Cardiovascular:     Rate and Rhythm: Normal rate and regular rhythm.     Heart sounds: Normal heart sounds.  Pulmonary:     Effort: Pulmonary effort is normal.     Breath sounds: Normal breath sounds.  Abdominal:     General: Bowel sounds are normal.     Palpations: Abdomen is soft.  Musculoskeletal: Normal range of motion.  Skin:    General: Skin is warm and dry.  Neurological:     Mental Status: She is alert and oriented to person, place, and time. Mental status is at baseline.  Psychiatric:        Mood and Affect: Mood normal.        Behavior: Behavior normal.        Thought Content: Thought content normal.        Judgment: Judgment normal.      LABORATORY DATA:  I have reviewed the labs as listed.  CBC    Component Value Date/Time   WBC 9.7 04/19/2019 0823   RBC 4.52 04/19/2019 0823   HGB 12.5 04/19/2019 0823   HGB 12.7 11/24/2017 1626   HCT 38.7 04/19/2019 0823   HCT 36.6 11/24/2017 1626   PLT 298 04/19/2019 0823   PLT 420 (H) 11/24/2017 1626   MCV 85.6 04/19/2019 0823   MCV 88 11/24/2017 1626   MCH 27.7 04/19/2019 0823   MCHC 32.3 04/19/2019 0823   RDW 19.8 (H) 04/19/2019 0823   RDW 15.4 11/24/2017 1626   LYMPHSABS 4.3 (H) 04/19/2019 0823   LYMPHSABS 2.5 11/24/2017 1626   MONOABS 1.1 (H) 04/19/2019 0823   EOSABS 0.3 04/19/2019 0823   EOSABS 0.1 11/24/2017 1626   BASOSABS 0.1 04/19/2019 0823   BASOSABS 0.0 11/24/2017 1626   CMP Latest Ref Rng & Units  04/19/2019 03/29/2019 03/08/2019  Glucose 70 - 99 mg/dL 112(H) 114(H) 122(H)  BUN 8 - 23 mg/dL _0 Creatinine 0.44 - 1.00 mg/dL 1.17(H) 1.08(H) 0.96  Sodium 135 - 145 mmol/L 138 136 135  Potassium 3.5 - 5.1 mmol/L 3.8 3.4(L) 3.8  Chloride 98 - 111 mmol/L 103 99 99  CO2 22 - 32 mmol/L 24 21(L) 24  Calcium 8.9 - 10.3 mg/dL 9.2 9.3 9.1  Total Protein 6.5 - 8.1 g/dL 7.0 7.0 6.7  Total Bilirubin 0.3 - 1.2 mg/dL 0.9 1.0 0.7  Alkaline Phos 38 - 126 U/L 147(H) 174(H) 172(H)  AST 15 - 41 U/L 120(H) 156(H) 297(H)  ALT 0 - 44 U/L 53(H) 62(H) 155(H)       DIAGNOSTIC IMAGING:  I have independently reviewed the scans and discussed with the patient.   I have reviewed Francene Finders, NP's note and agree with the documentation.  I personally performed a face-to-face visit, made revisions and my assessment and plan is as follows.    ASSESSMENT & PLAN:   Malignant neoplasm of upper-outer quadrant of right breast in female, estrogen receptor negative ([place]) 1.  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 -12 cycles of weekly Taxol and Herceptin from 07/31/2018 through 10/30/2018. - Skin punch biopsy of the right breast on 07/28/2018 was positive for dermal lymphatic invasion. -Echo on 07/27/2018 shows  ejection fraction of 55 to 60%. -12 cycles of weekly paclitaxel and Herceptin from 07/31/2018 through 10/30/2018.   - PET scan on 02/09/2019 shows local progression of the disease with enlarging and increasing hypermetabolic right breast mass.  Multiple tiny 2-4 mm pulmonary nodules are stable.  No definitive signs of metastatic disease elsewhere. -3 cycles of Kadcyla from 02/15/2019 through 03/29/2019. -2D echocardiogram on 03/24/2019 shows EF of 60 to 65%. -Physical exam today reveals very good improvement in the erythema over the breast and posterior chest wall.  - We reviewed results of the PET scan dated 04/15/2019 which showed persistent intense focal right breast hypermetabolism, slightly increased.  However no evidence of metastatic disease. - I have recommended continuing Kadcyla until progression or severe intolerance.  She seems to be tolerating it very well. - She may proceed with cycle 4 today.  We will see her back in 3 weeks for follow-up.  2.  High risk drug monitoring: - Last 2D echocardiogram on 03/24/2019 shows EF of 60 to 65%. -We will continue to monitor her ejection fraction periodically.    Total time spent is 25 minutes with more than 50% of the time spent face-to-face discussing scan results, treatment plan and coordination of care.    Orders placed this encounter:  No orders of the defined types were placed in this encounter.     Derek Jack, MD [place] [phone]

## 2019-04-21 DIAGNOSIS — R69 Illness, unspecified: Secondary | ICD-10-CM | POA: Diagnosis not present

## 2019-04-29 ENCOUNTER — Encounter (HOSPITAL_COMMUNITY): Payer: Self-pay | Admitting: *Deleted

## 2019-04-29 NOTE — Progress Notes (Signed)
Patient called today asking about diverticulosis.  I explained to her that was not my area of expertise but she states that she knew I could help her.  She had questions about diet and the general disease process.    I contacted our local gastroenterology practice and was given some education material for the patient.    I called patient back and told her that I would put the education printouts in the mail for her today.   She verbalizes appreciation.

## 2019-05-10 ENCOUNTER — Encounter (HOSPITAL_COMMUNITY): Payer: Self-pay | Admitting: Hematology

## 2019-05-10 ENCOUNTER — Inpatient Hospital Stay (HOSPITAL_COMMUNITY): Payer: Medicare HMO | Attending: Hematology

## 2019-05-10 ENCOUNTER — Inpatient Hospital Stay (HOSPITAL_COMMUNITY): Payer: Medicare HMO

## 2019-05-10 ENCOUNTER — Other Ambulatory Visit: Payer: Self-pay

## 2019-05-10 ENCOUNTER — Inpatient Hospital Stay (HOSPITAL_BASED_OUTPATIENT_CLINIC_OR_DEPARTMENT_OTHER): Payer: Medicare HMO | Admitting: Hematology

## 2019-05-10 VITALS — BP 144/73 | HR 93 | Temp 96.7°F | Resp 18

## 2019-05-10 DIAGNOSIS — R0602 Shortness of breath: Secondary | ICD-10-CM

## 2019-05-10 DIAGNOSIS — C50411 Malignant neoplasm of upper-outer quadrant of right female breast: Secondary | ICD-10-CM

## 2019-05-10 DIAGNOSIS — I10 Essential (primary) hypertension: Secondary | ICD-10-CM | POA: Insufficient documentation

## 2019-05-10 DIAGNOSIS — Z79899 Other long term (current) drug therapy: Secondary | ICD-10-CM | POA: Insufficient documentation

## 2019-05-10 DIAGNOSIS — Z171 Estrogen receptor negative status [ER-]: Secondary | ICD-10-CM | POA: Diagnosis not present

## 2019-05-10 DIAGNOSIS — R7989 Other specified abnormal findings of blood chemistry: Secondary | ICD-10-CM

## 2019-05-10 DIAGNOSIS — K219 Gastro-esophageal reflux disease without esophagitis: Secondary | ICD-10-CM | POA: Insufficient documentation

## 2019-05-10 DIAGNOSIS — Z7982 Long term (current) use of aspirin: Secondary | ICD-10-CM

## 2019-05-10 DIAGNOSIS — E785 Hyperlipidemia, unspecified: Secondary | ICD-10-CM | POA: Insufficient documentation

## 2019-05-10 DIAGNOSIS — E041 Nontoxic single thyroid nodule: Secondary | ICD-10-CM

## 2019-05-10 DIAGNOSIS — Z5112 Encounter for antineoplastic immunotherapy: Secondary | ICD-10-CM

## 2019-05-10 LAB — COMPREHENSIVE METABOLIC PANEL
ALT: 42 U/L (ref 0–44)
AST: 102 U/L — ABNORMAL HIGH (ref 15–41)
Albumin: 2.7 g/dL — ABNORMAL LOW (ref 3.5–5.0)
Alkaline Phosphatase: 148 U/L — ABNORMAL HIGH (ref 38–126)
Anion gap: 12 (ref 5–15)
BUN: 12 mg/dL (ref 8–23)
CO2: 22 mmol/L (ref 22–32)
Calcium: 9 mg/dL (ref 8.9–10.3)
Chloride: 100 mmol/L (ref 98–111)
Creatinine, Ser: 0.94 mg/dL (ref 0.44–1.00)
GFR calc Af Amer: >60 mL/min (ref >60)
GFR calc non Af Amer: 55 mL/min — ABNORMAL LOW (ref >60)
Glucose, Bld: 120 mg/dL — ABNORMAL HIGH (ref 70–99)
Potassium: 3.7 mmol/L (ref 3.5–5.1)
Sodium: 134 mmol/L — ABNORMAL LOW (ref 135–145)
Total Bilirubin: 1.1 mg/dL (ref 0.3–1.2)
Total Protein: 6.9 g/dL (ref 6.5–8.1)

## 2019-05-10 LAB — CBC WITH DIFFERENTIAL/PLATELET
Abs Immature Granulocytes: 0.02 10*3/uL (ref 0.00–0.07)
Basophils Absolute: 0.1 10*3/uL (ref 0.0–0.1)
Basophils Relative: 1 %
Eosinophils Absolute: 0.3 10*3/uL (ref 0.0–0.5)
Eosinophils Relative: 3 %
HCT: 38.4 % (ref 36.0–46.0)
Hemoglobin: 12.5 g/dL (ref 12.0–15.0)
Immature Granulocytes: 0 %
Lymphocytes Relative: 41 %
Lymphs Abs: 4.4 10*3/uL — ABNORMAL HIGH (ref 0.7–4.0)
MCH: 27.9 pg (ref 26.0–34.0)
MCHC: 32.6 g/dL (ref 30.0–36.0)
MCV: 85.7 fL (ref 80.0–100.0)
Monocytes Absolute: 1.1 10*3/uL — ABNORMAL HIGH (ref 0.1–1.0)
Monocytes Relative: 10 %
Neutro Abs: 4.8 10*3/uL (ref 1.7–7.7)
Neutrophils Relative %: 45 %
Platelets: 265 10*3/uL (ref 150–400)
RBC: 4.48 MIL/uL (ref 3.87–5.11)
RDW: 20.4 % — ABNORMAL HIGH (ref 11.5–15.5)
WBC: 10.8 10*3/uL — ABNORMAL HIGH (ref 4.0–10.5)
nRBC: 0 % (ref 0.0–0.2)

## 2019-05-10 MED ORDER — SODIUM CHLORIDE 0.9 % IV SOLN
3.0000 mg/kg | Freq: Once | INTRAVENOUS | Status: AC
  Administered 2019-05-10: 240 mg via INTRAVENOUS
  Filled 2019-05-10: qty 4

## 2019-05-10 MED ORDER — HEPARIN SOD (PORK) LOCK FLUSH 100 UNIT/ML IV SOLN: 500.0000 [IU] | Freq: Once | INTRAVENOUS | Status: DC | PRN

## 2019-05-10 MED ORDER — STERILE WATER FOR INJECTION IJ SOLN
INTRAMUSCULAR | Status: AC
  Filled 2019-05-10: qty 10

## 2019-05-10 MED ORDER — SODIUM CHLORIDE 0.9 % IV SOLN
Freq: Once | INTRAVENOUS | Status: AC
  Administered 2019-05-10: 11:00:00 via INTRAVENOUS

## 2019-05-10 MED ORDER — ALTEPLASE 2 MG IJ SOLR
2.0000 mg | Freq: Once | INTRAMUSCULAR | Status: AC
  Administered 2019-05-10: 2 mg

## 2019-05-10 MED ORDER — SODIUM CHLORIDE 0.9% FLUSH
10.0000 mL | INTRAVENOUS | Status: DC | PRN
  Administered 2019-05-10: 10 mL
  Filled 2019-05-10: qty 10

## 2019-05-10 MED ORDER — ALTEPLASE 2 MG IJ SOLR
INTRAMUSCULAR | Status: AC
  Filled 2019-05-10: qty 2

## 2019-05-10 MED ORDER — ACETAMINOPHEN 325 MG PO TABS
650.0000 mg | ORAL_TABLET | Freq: Once | ORAL | Status: AC
  Administered 2019-05-10: 650 mg via ORAL
  Filled 2019-05-10: qty 2

## 2019-05-10 MED ORDER — DIPHENHYDRAMINE HCL 25 MG PO CAPS
50.0000 mg | ORAL_CAPSULE | Freq: Once | ORAL | Status: AC
  Administered 2019-05-10: 50 mg via ORAL
  Filled 2019-05-10: qty 2

## 2019-05-10 NOTE — Progress Notes (Signed)
[place], [place] [address]   CLINIC:  Medical Oncology/Hematology  PCP:  [name], MD [address] [place] [address] [phone]   REASON FOR VISIT: Follow-up for right breast cancer, ER-/PR-/HER2+  CURRENT THERAPY: Kadcyla every 3 weeks   BRIEF ONCOLOGIC HISTORY:  Oncology History  Malignant neoplasm of upper-outer quadrant of right breast in female, estrogen receptor negative ([name])  07/03/2018 Initial Diagnosis   Palpable right breast mass for several months with skin discoloration, clinically skin trabecular thickening with nipple retraction measuring 7.2 x 4.3 x 4.2 cm, additional mass 4 o'clock position 2.6 cm, right axillary tail 1.2 cm right axillary lymph node 1.8 cm left breast irregular mass 1 cm, adjacent mass 0.9 cm together measuring 1.9 cm, no lymphadenopathy   07/03/2018 Pathology Results   Right breast biopsy: IDC grade 2, ER 0%, PR 0%, HER-2 positive, Ki-67 40%, lymph node biopsy negative, left breast biopsy complex sclerosing lesion    07/15/2018 Cancer Staging   Staging form: Breast, AJCC 8th Edition - Clinical: Stage IIB (cT3, cN0, cM0, G2, ER-, PR-, HER2+) - Signed by Adrienne Lose, MD on 07/15/2018   07/31/2018 - 10/30/2018 Chemotherapy   The patient had trastuzumab (HERCEPTIN) 336 mg in sodium chloride 0.9 % 250 mL chemo infusion, 4 mg/kg = 336 mg, Intravenous,  Once, 4 of 4 cycles Administration: 336 mg (07/31/2018), 168 mg (08/07/2018), 168 mg (08/28/2018), 168 mg (08/14/2018), 168 mg (08/21/2018), 168 mg (09/04/2018), 168 mg (09/11/2018), 168 mg (09/18/2018), 168 mg (10/02/2018), 168 mg (10/09/2018), 168 mg (10/16/2018), 168 mg (10/23/2018), 168 mg (10/30/2018) PACLitaxel (TAXOL) 78 mg in sodium chloride 0.9 % 250 mL chemo infusion (</= 93m/m2), 40 mg/m2 = 78 mg (50 % of original dose 80 mg/m2), Intravenous,  Once, 4 of 4 cycles Dose modification: 40 mg/m2 (50 % of original dose 80 mg/m2, Cycle 1, Reason: Patient  Age), 53.3333 mg/m2 (66.7 % of original dose 80 mg/m2, Cycle 1, Reason: Provider Judgment), 45 mg/m2 (original dose 80 mg/m2, Cycle 3, Reason: Provider Judgment) Administration: 78 mg (07/31/2018), 102 mg (08/07/2018), 102 mg (08/28/2018), 102 mg (08/14/2018), 102 mg (08/21/2018), 102 mg (09/04/2018), 102 mg (09/18/2018), 84 mg (10/02/2018), 84 mg (10/09/2018), 84 mg (10/16/2018), 84 mg (10/23/2018), 84 mg (10/30/2018)  for chemotherapy treatment.    11/13/2018 - 02/14/2019 Chemotherapy   The patient had trastuzumab (HERCEPTIN) 504 mg in sodium chloride 0.9 % 250 mL chemo infusion, 6 mg/kg = 504 mg (100 % of original dose 6 mg/kg), Intravenous,  Once, 4 of 5 cycles Dose modification: 6 mg/kg (original dose 6 mg/kg, Cycle 1, Reason: Other (see comments), Comment: pt receving it ) Administration: 504 mg (11/13/2018), 504 mg (12/04/2018), 504 mg (01/04/2019), 504 mg (01/25/2019)  for chemotherapy treatment.    02/15/2019 -  Chemotherapy   The patient had ado-trastuzumab emtansine (KADCYLA) 240 mg in sodium chloride 0.9 % 250 mL chemo infusion, 3 mg/kg = 240 mg (100 % of original dose 3 mg/kg), Intravenous, Once, 5 of 7 cycles Dose modification: 3 mg/kg (original dose 3 mg/kg, Cycle 1, Reason: Provider Judgment), 3 mg/kg (original dose 3 mg/kg, Cycle 2, Reason: Provider Judgment) Administration: 240 mg (02/15/2019), 240 mg (03/08/2019), 240 mg (05/10/2019), 240 mg (03/29/2019), 240 mg (04/19/2019)  for chemotherapy treatment.       CANCER STAGING: Cancer Staging Malignant neoplasm of upper-outer quadrant of right breast in female, estrogen receptor negative ([place] Staging form: Breast, AJCC 8th Edition - Clinical: Stage IIB (cT3, cN0,  cM0, G2, ER-, PR-, HER2+) - Signed by Adrienne Lose, MD on 07/15/2018    INTERVAL HISTORY:  [name] 85 y.o. female seen for follow-up of breast cancer.  She has some shortness of breath on exertion.  She has some itching on the legs without any rash.  She is tolerating Kadcyla very  well.  Appetite is 100%.  Energy levels are 50%.  Denies any nausea, vomiting, diarrhea or constipation.  Denies any symptoms of PND or orthopnea.  Denies any ER visits or hospitalizations.    REVIEW OF SYSTEMS:  Review of Systems  Respiratory: Positive for shortness of breath (with exertion).   Skin: Positive for itching. Rash: inproved.  All other systems reviewed and are negative.    PAST MEDICAL/SURGICAL HISTORY:  Past Medical History:  Diagnosis Date  . Cancer ([place]) 06/2018   right breast cancer  . GERD (gastroesophageal reflux disease)    occasional   . Hyperlipidemia   . Hypertension    clearance with note Dr [name] on chart  . Pneumonia    2 years ago/ states occ cough with sinus drainage- no fever  . Thyroid nodule    with biopsy- states following medically   Past Surgical History:  Procedure Laterality Date  . CATARACT EXTRACTION W/PHACO  11/30/2012   Procedure: CATARACT EXTRACTION PHACO AND INTRAOCULAR LENS PLACEMENT (IOC);  Surgeon: Adrienne Che, MD;  Location: [facility];  Service: Ophthalmology;  Laterality: Left;  CDE:11.49  . CATARACT EXTRACTION W/PHACO Right 03/15/2013   Procedure: CATARACT EXTRACTION PHACO AND INTRAOCULAR LENS PLACEMENT (IOC);  Surgeon: Adrienne Che, MD;  Location: [facility];  Service: Ophthalmology;  Laterality: Right;  CDE 16.15  . COLONOSCOPY    . EXCISION OF SKIN TAG Right 06/17/2016   Procedure: SHAVE OF SKIN TAG RIGHT BUTTOCK;  Surgeon: Adrienne Signs, MD;  Location: [facility];  Service: General;  Laterality: Right;  . JOINT REPLACEMENT     left knee  . MASS EXCISION Left 06/17/2016   Procedure: EXCISION SKIN MALIGNANT LESION LEFT BUTTOCK;  Surgeon: Adrienne Signs, MD;  Location: [facility];  Service: General;  Laterality: Left;  . PORTACATH PLACEMENT Right 07/28/2018   Procedure: INSERTION PORT-A-CATH WITH ULTRASOUND;  Surgeon: Adrienne Bookbinder, MD;  Location: [facility];  Service: General;  Laterality: Right;  . PUNCH BIOPSY OF SKIN  Right 07/28/2018   Procedure: PUNCH BIOPSY OF SKIN RIGHT BREAST;  Surgeon: Adrienne Bookbinder, MD;  Location: [facility];  Service: General;  Laterality: Right;  . TOTAL KNEE ARTHROPLASTY  12/16/2011   Procedure: TOTAL KNEE ARTHROPLASTY;  Surgeon: Adrienne Alf, MD;  Location: [facility];  Service: Orthopedics;  Laterality: Right;  . TUBAL LIGATION       SOCIAL HISTORY:  Social History   Socioeconomic History  . Marital status: Married    Spouse name: Not on file  . Number of children: 2  . Years of education: some business school after HS  . Highest education level: Not on file  Occupational History  . Occupation: Cabin crew  . Occupation: Network engineer   Social Needs  . Financial resource strain: Not hard at all  . Food insecurity    Worry: Never true    Inability: Never true  . Transportation needs    Medical: No    Non-medical: No  Tobacco Use  . Smoking status: Never Smoker  . Smokeless tobacco: Never Used  Substance and Sexual Activity  . Alcohol use: No    Frequency: Never  .  Drug use: No  . Sexual activity: Yes    Birth control/protection: None  Lifestyle  . Physical activity    Days per week: 0 days    Minutes per session: 0 min  . Stress: Not at all  Relationships  . Social connections    Talks on phone: More than three times a week    Gets together: More than three times a week    Attends religious service: More than 4 times per year    Active member of club or organization: Yes    Attends meetings of clubs or organizations: Never    Relationship status: Married  . Intimate partner violence    Fear of current or ex partner: No    Emotionally abused: No    Physically abused: No    Forced sexual activity: No  Other Topics Concern  . Not on file  Social History Narrative   Lives at home with her husband.   4 cups caffeine per day.   Right-handed.    FAMILY HISTORY:  Family History  Problem Relation Age of Onset  . Heart attack Mother   .  Bronchitis Father   . Diabetes Sister   . Leukemia Brother   . Lung disease Brother   . Lung disease Sister     CURRENT MEDICATIONS:  Outpatient Encounter Medications as of 05/10/2019  Medication Sig  . acetaminophen (TYLENOL) 325 MG tablet Take 650 mg by mouth every 6 (six) hours as needed.  Marland Kitchen aspirin 325 MG tablet Take 162.5 mg by mouth daily.   . cetirizine (ZYRTEC) 10 MG tablet Take 10 mg by mouth daily.  . hydrocortisone 1 % ointment Apply 1 application topically 2 (two) times daily.  Marland Kitchen lidocaine-prilocaine (EMLA) cream Apply to port site one hour prior to appointment and cover with plastic wrap.  . Misc. Devices MISC Please provide lymphedema sleeve for right arm  . Multiple Vitamin (MULTIVITAMIN WITH MINERALS) TABS tablet Take 1 tablet by mouth daily.  . Omega-3 Fatty Acids (FISH OIL PO) Take 1 capsule by mouth daily.   Glory Rosebush ULTRA test strip USE 1 STRIP TO CHECK GLUCOSE 2 TO 3 TIMES DAILY  . PACLitaxel (TAXOL IV) Inject into the vein once a week.  . prochlorperazine (COMPAZINE) 10 MG tablet Take 1 tablet (10 mg total) by mouth every 6 (six) hours as needed (Nausea or vomiting).  . Trastuzumab (HERCEPTIN IV) Inject into the vein once a week.   Marland Kitchen UNABLE TO FIND Take 1 capsule by mouth daily. Med Name: Tumeric Curcumin   No facility-administered encounter medications on file as of 05/10/2019.     ALLERGIES:  Allergies  Allergen Reactions  . Penicillins Rash    Has patient had a PCN reaction causing immediate rash, facial/tongue/throat swelling, SOB or lightheadedness with hypotension: No Has patient had a PCN reaction causing severe rash involving mucus membranes or skin necrosis: No Has patient had a PCN reaction that required hospitalization: No Has patient had a PCN reaction occurring within the last 10 years: No If all of the above answers are "NO", then may proceed with Cephalosporin use.      PHYSICAL EXAM:  ECOG Performance status: 1  Vitals:   05/10/19 0914   BP: (!) 150/76  Pulse: (!) 112  Resp: 16  Temp: (!) 96.8 F (36 C)  SpO2: 98%   Filed Weights   05/10/19 0914  Weight: 171 lb (77.6 kg)    Physical Exam Constitutional:  Appearance: Normal appearance. She is normal weight.  Cardiovascular:     Rate and Rhythm: Normal rate and regular rhythm.     Heart sounds: Normal heart sounds.  Pulmonary:     Effort: Pulmonary effort is normal.     Breath sounds: Normal breath sounds.  Abdominal:     General: Bowel sounds are normal.     Palpations: Abdomen is soft.  Musculoskeletal: Normal range of motion.  Skin:    General: Skin is warm and dry.  Neurological:     Mental Status: She is alert and oriented to person, place, and time. Mental status is at baseline.  Psychiatric:        Mood and Affect: Mood normal.        Behavior: Behavior normal.        Thought Content: Thought content normal.        Judgment: Judgment normal.      LABORATORY DATA:  I have reviewed the labs as listed.  CBC    Component Value Date/Time   WBC 10.8 (H) 05/10/2019 0845   RBC 4.48 05/10/2019 0845   HGB 12.5 05/10/2019 0845   HGB 12.7 11/24/2017 1626   HCT 38.4 05/10/2019 0845   HCT 36.6 11/24/2017 1626   PLT 265 05/10/2019 0845   PLT 420 (H) 11/24/2017 1626   MCV 85.7 05/10/2019 0845   MCV 88 11/24/2017 1626   MCH 27.9 05/10/2019 0845   MCHC 32.6 05/10/2019 0845   RDW 20.4 (H) 05/10/2019 0845   RDW 15.4 11/24/2017 1626   LYMPHSABS 4.4 (H) 05/10/2019 0845   LYMPHSABS 2.5 11/24/2017 1626   MONOABS 1.1 (H) 05/10/2019 0845   EOSABS 0.3 05/10/2019 0845   EOSABS 0.1 11/24/2017 1626   BASOSABS 0.1 05/10/2019 0845   BASOSABS 0.0 11/24/2017 1626   CMP Latest Ref Rng & Units 05/10/2019 04/19/2019 03/29/2019  Glucose 70 - 99 mg/dL 120(H) 112(H) 114(H)  BUN 8 - 23 mg/dL _0 Creatinine 0.44 - 1.00 mg/dL 0.94 1.17(H) 1.08(H)  Sodium 135 - 145 mmol/L 134(L) 138 136  Potassium 3.5 - 5.1 mmol/L 3.7 3.8 3.4(L)  Chloride 98 - 111 mmol/L 100  103 99  CO2 22 - 32 mmol/L 22 24 21(L)  Calcium 8.9 - 10.3 mg/dL 9.0 9.2 9.3  Total Protein 6.5 - 8.1 g/dL 6.9 7.0 7.0  Total Bilirubin 0.3 - 1.2 mg/dL 1.1 0.9 1.0  Alkaline Phos 38 - 126 U/L 148(H) 147(H) 174(H)  AST 15 - 41 U/L 102(H) 120(H) 156(H)  ALT 0 - 44 U/L 42 53(H) 62(H)       DIAGNOSTIC IMAGING:  I have independently reviewed the scans and discussed with the patient.    ASSESSMENT & PLAN:   Malignant neoplasm of upper-outer quadrant of right breast in female, estrogen receptor negative ([place]) 1.  Locally advanced inflammatory right breast cancer, ER/PR negative, HER-2 positive: -Initial presentation with skin involvement of the right breast, axillary region and posterior chest wall. -Skin punch biopsy on 07/28/2018 of the right breast shows positive for dermal lymphatic invasion. -12 cycles of weekly Taxol and Herceptin from 07/31/2018 through 10/30/2018. - PET scan on 02/09/2019 with local progression of the disease with enlarging and increasing hypermetabolic right breast mass, multiple tiny 2-4 mm pulmonary nodules stable.  No definitive Kelly of metastatic disease elsewhere. - 4 cycles of Kadcyla from 02/15/2019 through 04/19/2019. -2D echocardiogram on 03/24/2019 with ejection fraction of 60 to 65%. -PET scan on 04/15/2019 showed persistent and intense focal right  breast hypermetabolism, slightly increased.  No evidence of metastatic disease. -Physical exam today shows complete resolution of erythema over the right breast, right axillary region and right posterior chest wall. - Her elevated LFTs are also improving.  We have reviewed her labs. -She is not experiencing any side effects from Kadcyla.  She may proceed with next cycle today.  We will reevaluate her in 3 weeks.  She reportedly has an appointment with Dr. Donne Hazel end of August.  2.  High risk drug monitoring: -Last echocardiogram on 03/24/2019 shows EF of 60 to 65%. -We will continue to monitor her ejection  fraction periodically.   Total time spent is 25 minutes with more than 50% of the time spent face-to-face discussing scan results, treatment plan and coordination of care.    Orders placed this encounter:  No orders of the defined types were placed in this encounter.     Derek Jack, MD [place] [phone]

## 2019-05-10 NOTE — Progress Notes (Signed)
1025-no blood return noted today. Will try some alteplase per protocol.  1155-alteplase removed per protocol. 8ml.  Blood return noted.    Treatment given per orders. Patient tolerated it well without problems. Vitals stable and discharged home from clinic via wheelchair. Follow up as scheduled.

## 2019-05-10 NOTE — Patient Instructions (Signed)
[facility] Discharge Instructions for Patients Receiving Chemotherapy  Today you received the following chemotherapy agents   To help prevent nausea and vomiting after your treatment, we encourage you to take your nausea medication   If you develop nausea and vomiting that is not controlled by your nausea medication, call the clinic.   BELOW ARE SYMPTOMS THAT SHOULD BE REPORTED IMMEDIATELY:  *FEVER GREATER THAN 100.5 F  *CHILLS WITH OR WITHOUT FEVER  NAUSEA AND VOMITING THAT IS NOT CONTROLLED WITH YOUR NAUSEA MEDICATION  *UNUSUAL SHORTNESS OF BREATH  *UNUSUAL BRUISING OR BLEEDING  TENDERNESS IN MOUTH AND THROAT WITH OR WITHOUT PRESENCE OF ULCERS  *URINARY PROBLEMS  *BOWEL PROBLEMS  UNUSUAL RASH Items with * indicate a potential emergency and should be followed up as soon as possible.  Feel free to call the clinic should you have any questions or concerns. The clinic phone number is [phone].  Please show the CHEMO ALERT CARD at check-in to the Emergency Department and triage nurse.   

## 2019-05-10 NOTE — Assessment & Plan Note (Signed)
1.  Locally advanced inflammatory right breast cancer, ER/PR negative, HER-2 positive: -Initial presentation with skin involvement of the right breast, axillary region and posterior chest wall. -Skin punch biopsy on 07/28/2018 of the right breast shows positive for dermal lymphatic invasion. -12 cycles of weekly Taxol and Herceptin from 07/31/2018 through 10/30/2018. - PET scan on 02/09/2019 with local progression of the disease with enlarging and increasing hypermetabolic right breast mass, multiple tiny 2-4 mm pulmonary nodules stable.  No definitive signs of metastatic disease elsewhere. - 4 cycles of Kadcyla from 02/15/2019 through 04/19/2019. -2D echocardiogram on 03/24/2019 with ejection fraction of 60 to 65%. -PET scan on 04/15/2019 showed persistent and intense focal right breast hypermetabolism, slightly increased.  No evidence of metastatic disease. -Physical exam today shows complete resolution of erythema over the right breast, right axillary region and right posterior chest wall. - Her elevated LFTs are also improving.  We have reviewed her labs. -She is not experiencing any side effects from Kadcyla.  She may proceed with next cycle today.  We will reevaluate her in 3 weeks.  She reportedly has an appointment with Dr. Donne Hazel end of August.  2.  High risk drug monitoring: -Last echocardiogram on 03/24/2019 shows EF of 60 to 65%. -We will continue to monitor her ejection fraction periodically.

## 2019-05-10 NOTE — Patient Instructions (Addendum)
[facility] at [facility] Discharge Instructions  You were seen today by Dr. Katragadda. He went over your recent lab results. He will see you back in 3 weeks for labs and follow up.   Thank you for choosing [facility] at [facility] to provide your oncology and hematology care.  To afford each patient quality time with our provider, please arrive at least 15 minutes before your scheduled appointment time.   If you have a lab appointment with the Cancer Center please come in thru the  Main Entrance and check in at the main information desk  You need to re-schedule your appointment should you arrive 10 or more minutes late.  We strive to give you quality time with our providers, and arriving late affects you and other patients whose appointments are after yours.  Also, if you no show three or more times for appointments you may be dismissed from the clinic at the providers discretion.     Again, thank you for choosing [facility].  Our hope is that these requests will decrease the amount of time that you wait before being seen by our physicians.       _____________________________________________________________  Should you have questions after your visit to [facility], please contact our office at [phone] between the hours of 8:00 a.m. and 4:30 p.m.  Voicemails left after 4:00 p.m. will not be returned until the following business day.  For prescription refill requests, have your pharmacy contact our office and allow 72 hours.    Cancer Center Support Programs:   > Cancer Support Group  2nd Tuesday of the month 1pm-2pm, Journey Room    

## 2019-06-01 ENCOUNTER — Inpatient Hospital Stay (HOSPITAL_COMMUNITY): Payer: Medicare HMO

## 2019-06-01 ENCOUNTER — Other Ambulatory Visit: Payer: Self-pay

## 2019-06-01 ENCOUNTER — Inpatient Hospital Stay (HOSPITAL_COMMUNITY): Payer: Medicare HMO | Attending: Hematology

## 2019-06-01 ENCOUNTER — Inpatient Hospital Stay (HOSPITAL_BASED_OUTPATIENT_CLINIC_OR_DEPARTMENT_OTHER): Payer: Medicare HMO | Admitting: Hematology

## 2019-06-01 ENCOUNTER — Encounter (HOSPITAL_COMMUNITY): Payer: Self-pay | Admitting: Hematology

## 2019-06-01 VITALS — BP 143/70 | HR 106 | Temp 97.3°F | Resp 18 | Wt 168.2 lb

## 2019-06-01 VITALS — BP 132/75 | HR 101 | Temp 97.9°F | Resp 18

## 2019-06-01 DIAGNOSIS — K219 Gastro-esophageal reflux disease without esophagitis: Secondary | ICD-10-CM | POA: Diagnosis not present

## 2019-06-01 DIAGNOSIS — Z171 Estrogen receptor negative status [ER-]: Secondary | ICD-10-CM

## 2019-06-01 DIAGNOSIS — Z9221 Personal history of antineoplastic chemotherapy: Secondary | ICD-10-CM | POA: Diagnosis not present

## 2019-06-01 DIAGNOSIS — Z7982 Long term (current) use of aspirin: Secondary | ICD-10-CM | POA: Diagnosis not present

## 2019-06-01 DIAGNOSIS — Z79899 Other long term (current) drug therapy: Secondary | ICD-10-CM | POA: Insufficient documentation

## 2019-06-01 DIAGNOSIS — E041 Nontoxic single thyroid nodule: Secondary | ICD-10-CM | POA: Insufficient documentation

## 2019-06-01 DIAGNOSIS — I10 Essential (primary) hypertension: Secondary | ICD-10-CM | POA: Diagnosis not present

## 2019-06-01 DIAGNOSIS — C50411 Malignant neoplasm of upper-outer quadrant of right female breast: Secondary | ICD-10-CM

## 2019-06-01 DIAGNOSIS — Z5112 Encounter for antineoplastic immunotherapy: Secondary | ICD-10-CM | POA: Diagnosis present

## 2019-06-01 DIAGNOSIS — E785 Hyperlipidemia, unspecified: Secondary | ICD-10-CM | POA: Diagnosis not present

## 2019-06-01 DIAGNOSIS — R918 Other nonspecific abnormal finding of lung field: Secondary | ICD-10-CM | POA: Insufficient documentation

## 2019-06-01 LAB — COMPREHENSIVE METABOLIC PANEL
ALT: 34 U/L (ref 0–44)
AST: 80 U/L — ABNORMAL HIGH (ref 15–41)
Albumin: 2.7 g/dL — ABNORMAL LOW (ref 3.5–5.0)
Alkaline Phosphatase: 129 U/L — ABNORMAL HIGH (ref 38–126)
Anion gap: 12 (ref 5–15)
BUN: 14 mg/dL (ref 8–23)
CO2: 20 mmol/L — ABNORMAL LOW (ref 22–32)
Calcium: 9 mg/dL (ref 8.9–10.3)
Chloride: 103 mmol/L (ref 98–111)
Creatinine, Ser: 1.04 mg/dL — ABNORMAL HIGH (ref 0.44–1.00)
GFR calc Af Amer: 56 mL/min — ABNORMAL LOW (ref >60)
GFR calc non Af Amer: 48 mL/min — ABNORMAL LOW (ref >60)
Glucose, Bld: 116 mg/dL — ABNORMAL HIGH (ref 70–99)
Potassium: 3.6 mmol/L (ref 3.5–5.1)
Sodium: 135 mmol/L (ref 135–145)
Total Bilirubin: 0.8 mg/dL (ref 0.3–1.2)
Total Protein: 7.1 g/dL (ref 6.5–8.1)

## 2019-06-01 LAB — CBC WITH DIFFERENTIAL/PLATELET
Abs Immature Granulocytes: 0.03 10*3/uL (ref 0.00–0.07)
Basophils Absolute: 0.1 10*3/uL (ref 0.0–0.1)
Basophils Relative: 1 %
Eosinophils Absolute: 0.5 10*3/uL (ref 0.0–0.5)
Eosinophils Relative: 5 %
HCT: 39.1 % (ref 36.0–46.0)
Hemoglobin: 12.8 g/dL (ref 12.0–15.0)
Immature Granulocytes: 0 %
Lymphocytes Relative: 34 %
Lymphs Abs: 3.6 10*3/uL (ref 0.7–4.0)
MCH: 28.3 pg (ref 26.0–34.0)
MCHC: 32.7 g/dL (ref 30.0–36.0)
MCV: 86.3 fL (ref 80.0–100.0)
Monocytes Absolute: 1.3 10*3/uL — ABNORMAL HIGH (ref 0.1–1.0)
Monocytes Relative: 12 %
Neutro Abs: 5.2 10*3/uL (ref 1.7–7.7)
Neutrophils Relative %: 48 %
Platelets: 271 10*3/uL (ref 150–400)
RBC: 4.53 MIL/uL (ref 3.87–5.11)
RDW: 19.9 % — ABNORMAL HIGH (ref 11.5–15.5)
WBC: 10.7 10*3/uL — ABNORMAL HIGH (ref 4.0–10.5)
nRBC: 0 % (ref 0.0–0.2)

## 2019-06-01 MED ORDER — HEPARIN SOD (PORK) LOCK FLUSH 100 UNIT/ML IV SOLN
500.0000 [IU] | Freq: Once | INTRAVENOUS | Status: AC | PRN
  Administered 2019-06-01: 500 [IU]

## 2019-06-01 MED ORDER — SODIUM CHLORIDE 0.9 % IV SOLN
Freq: Once | INTRAVENOUS | Status: AC
  Administered 2019-06-01: 10:00:00 via INTRAVENOUS

## 2019-06-01 MED ORDER — SODIUM CHLORIDE 0.9 % IV SOLN
3.0000 mg/kg | Freq: Once | INTRAVENOUS | Status: AC
  Administered 2019-06-01: 240 mg via INTRAVENOUS
  Filled 2019-06-01: qty 4

## 2019-06-01 MED ORDER — SODIUM CHLORIDE 0.9% FLUSH
10.0000 mL | INTRAVENOUS | Status: DC | PRN
  Administered 2019-06-01: 10 mL
  Filled 2019-06-01: qty 10

## 2019-06-01 MED ORDER — ACETAMINOPHEN 325 MG PO TABS
650.0000 mg | ORAL_TABLET | Freq: Once | ORAL | Status: AC
  Administered 2019-06-01: 650 mg via ORAL
  Filled 2019-06-01: qty 2

## 2019-06-01 MED ORDER — DIPHENHYDRAMINE HCL 25 MG PO CAPS
50.0000 mg | ORAL_CAPSULE | Freq: Once | ORAL | Status: AC
  Administered 2019-06-01: 50 mg via ORAL
  Filled 2019-06-01: qty 2

## 2019-06-01 NOTE — Patient Instructions (Signed)
[facility] Discharge Instructions for Patients Receiving Chemotherapy   Beginning January 23rd 2017 lab work for the Cancer Center will be done in the  Main lab at  on 1st floor. If you have a lab appointment with the Cancer Center please come in thru the  Main Entrance and check in at the main information desk   Today you received the following chemotherapy agents Kadcyla. Follow-up as scheduled. Call clinic for any questions or concerns  To help prevent nausea and vomiting after your treatment, we encourage you to take your nausea medication   If you develop nausea and vomiting, or diarrhea that is not controlled by your medication, call the clinic.  The clinic phone number is [phone]. Office hours are Monday-Friday 8:30am-5:00pm.  BELOW ARE SYMPTOMS THAT SHOULD BE REPORTED IMMEDIATELY:  *FEVER GREATER THAN 101.0 F  *CHILLS WITH OR WITHOUT FEVER  NAUSEA AND VOMITING THAT IS NOT CONTROLLED WITH YOUR NAUSEA MEDICATION  *UNUSUAL SHORTNESS OF BREATH  *UNUSUAL BRUISING OR BLEEDING  TENDERNESS IN MOUTH AND THROAT WITH OR WITHOUT PRESENCE OF ULCERS  *URINARY PROBLEMS  *BOWEL PROBLEMS  UNUSUAL RASH Items with * indicate a potential emergency and should be followed up as soon as possible. If you have an emergency after office hours please contact your primary care physician or go to the nearest emergency department.  Please call the clinic during office hours if you have any questions or concerns.   You may also contact the Patient Navigator at [phone] should you have any questions or need assistance in obtaining follow up care.      Resources For Cancer Patients and their Caregivers ? American Cancer Society: Can assist with transportation, wigs, general needs, runs Look Good Feel Better.        1-888-227-6333 ? Cancer Care: Provides financial assistance, online support groups, medication/co-pay assistance.  1-800-813-HOPE  (4673) ? [facility] Assists [place] cancer patients and their families through emotional , educational and financial support.  336-427-4357 ? [place] DSS Where to apply for food stamps, Medicaid and utility assistance. 336-342-1394 ? [facility]: Transportation to medical appointments. 336-347-2287 ? Social Security Administration: May apply for disability if have a Stage IV cancer. 336-342-7796 1-800-772-1213 ? [place] Aging, Disability and Transit Services: Assists with nutrition, care and transit needs. 336-349-2343         

## 2019-06-01 NOTE — Assessment & Plan Note (Signed)
1.  Metastatic right breast cancer to the skin, ER/PR negative, HER-2 positive: -Initial presentation with skin involvement of the right breast, axillary region and posterior chest wall. -Skin punch biopsy on 07/28/2018 of the right breast positive for dermal lymphatic invasion. -12 cycles of weekly Taxol and Herceptin from 07/31/2018 through 10/30/2018. -PET scan on 02/09/2019 with local progression of the disease with enlarging and increasing hypermetabolic right breast mass, multiple tiny 2-4 mm pulmonary nodules stable.  No definitive signs of metastatic disease elsewhere. - 5 cycles of Kadcyla 3 mg/kg from 02/15/2019 through 05/10/2019. - PET scan on 04/15/2019 showed persistent and intense focal right breast hypermetabolism, slightly increased.  No evidence of metastatic disease. -Physical examination shows complete resolution of the erythema of the right breast, right axillary region and posterior chest wall with excellent response. -LFTs are also improving.  We have reviewed her labs.  She may proceed with next cycle of Kadcyla. - We plan to repeat PET scan in 4 months from the last scan.  We will also plan to repeat echocardiogram prior to next visit in 3 weeks.  2.  High risk drug monitoring: -Last echo on 03/24/2019 shows EF of 60 to 65%. -We will continue to monitor her ejection fraction periodically.  I plan to repeat echo prior to next visit in 3 weeks.

## 2019-06-01 NOTE — Progress Notes (Signed)
[address], [place] [address]   CLINIC:  Medical Oncology/Hematology  PCP:  [name], MD [address] [place] [address] [phone]   REASON FOR VISIT: Follow-up for right breast cancer, ER-/PR-/HER2+  CURRENT THERAPY: Kadcyla every 3 weeks   BRIEF ONCOLOGIC HISTORY:  Oncology History  Malignant neoplasm of upper-outer quadrant of right breast in female, estrogen receptor negative ([place])  07/03/2018 Initial Diagnosis   Palpable right breast mass for several months with skin discoloration, clinically skin trabecular thickening with nipple retraction measuring 7.2 x 4.3 x 4.2 cm, additional mass 4 o'clock position 2.6 cm, right axillary tail 1.2 cm right axillary lymph node 1.8 cm left breast irregular mass 1 cm, adjacent mass 0.9 cm together measuring 1.9 cm, no lymphadenopathy   07/03/2018 Pathology Results   Right breast biopsy: IDC grade 2, ER 0%, PR 0%, HER-2 positive, Ki-67 40%, lymph node biopsy negative, left breast biopsy complex sclerosing lesion    07/15/2018 Cancer Staging   Staging form: Breast, AJCC 8th Edition - Clinical: Stage IIB (cT3, cN0, cM0, G2, ER-, PR-, HER2+) - Signed by [name], MD on 07/15/2018   07/31/2018 - 10/30/2018 Chemotherapy   The patient had trastuzumab (HERCEPTIN) 336 mg in sodium chloride 0.9 % 250 mL chemo infusion, 4 mg/kg = 336 mg, Intravenous,  Once, 4 of 4 cycles Administration: 336 mg (07/31/2018), 168 mg (08/07/2018), 168 mg (08/28/2018), 168 mg (08/14/2018), 168 mg (08/21/2018), 168 mg (09/04/2018), 168 mg (09/11/2018), 168 mg (09/18/2018), 168 mg (10/02/2018), 168 mg (10/09/2018), 168 mg (10/16/2018), 168 mg (10/23/2018), 168 mg (10/30/2018) PACLitaxel (TAXOL) 78 mg in sodium chloride 0.9 % 250 mL chemo infusion (</= 61m/m2), 40 mg/m2 = 78 mg (50 % of original dose 80 mg/m2), Intravenous,  Once, 4 of 4 cycles Dose modification: 40 mg/m2 (50 % of original dose 80 mg/m2, Cycle 1, Reason: Patient  Age), 53.3333 mg/m2 (66.7 % of original dose 80 mg/m2, Cycle 1, Reason: Provider Judgment), 45 mg/m2 (original dose 80 mg/m2, Cycle 3, Reason: Provider Judgment) Administration: 78 mg (07/31/2018), 102 mg (08/07/2018), 102 mg (08/28/2018), 102 mg (08/14/2018), 102 mg (08/21/2018), 102 mg (09/04/2018), 102 mg (09/18/2018), 84 mg (10/02/2018), 84 mg (10/09/2018), 84 mg (10/16/2018), 84 mg (10/23/2018), 84 mg (10/30/2018)  for chemotherapy treatment.    11/13/2018 - 02/14/2019 Chemotherapy   The patient had trastuzumab (HERCEPTIN) 504 mg in sodium chloride 0.9 % 250 mL chemo infusion, 6 mg/kg = 504 mg (100 % of original dose 6 mg/kg), Intravenous,  Once, 4 of 5 cycles Dose modification: 6 mg/kg (original dose 6 mg/kg, Cycle 1, Reason: Other (see comments), Comment: pt receving it ) Administration: 504 mg (11/13/2018), 504 mg (12/04/2018), 504 mg (01/04/2019), 504 mg (01/25/2019)  for chemotherapy treatment.    02/15/2019 -  Chemotherapy   The patient had ado-trastuzumab emtansine (KADCYLA) 240 mg in sodium chloride 0.9 % 250 mL chemo infusion, 3 mg/kg = 240 mg (100 % of original dose 3 mg/kg), Intravenous, Once, 6 of 12 cycles Dose modification: 3 mg/kg (original dose 3 mg/kg, Cycle 1, Reason: Provider Judgment), 3 mg/kg (original dose 3 mg/kg, Cycle 2, Reason: Provider Judgment) Administration: 240 mg (02/15/2019), 240 mg (03/08/2019), 240 mg (05/10/2019), 240 mg (03/29/2019), 240 mg (04/19/2019), 240 mg (06/01/2019)  for chemotherapy treatment.       CANCER STAGING: Cancer Staging Malignant neoplasm of upper-outer quadrant of right breast in female, estrogen receptor negative ([place] Staging form: Breast, AJCC 8th Edition - Clinical: Stage  IIB (cT3, cN0, cM0, G2, ER-, PR-, HER2+) - Signed by [name], MD on 07/15/2018    INTERVAL HISTORY:  Ms. Alfred 85 y.o. female seen for follow-up of right breast cancer.  She is accompanied by her husband.  She is tolerating Kadcyla very well.  Last treatment was on  05/10/2019.  Denies any symptoms of PND or orthopnea.  Denies any tingling or numbness in extremities.  No nausea, vomiting, diarrhea or constipation reported.  Appetite is 100%.  Energy levels are 50%.  No pain reported.  No fevers or night sweats.    REVIEW OF SYSTEMS:  Review of Systems  Skin: Rash: inproved.  All other systems reviewed and are negative.    PAST MEDICAL/SURGICAL HISTORY:  Past Medical History:  Diagnosis Date  . Cancer ([place]) 06/2018   right breast cancer  . GERD (gastroesophageal reflux disease)    occasional   . Hyperlipidemia   . Hypertension    clearance with note Dr Nevada Crane on chart  . Pneumonia    2 years ago/ states occ cough with sinus drainage- no fever  . Thyroid nodule    with biopsy- states following medically   Past Surgical History:  Procedure Laterality Date  . CATARACT EXTRACTION W/PHACO  11/30/2012   Procedure: CATARACT EXTRACTION PHACO AND INTRAOCULAR LENS PLACEMENT (IOC);  Surgeon: Williams Che, MD;  Location: [facility];  Service: Ophthalmology;  Laterality: Left;  CDE:11.49  . CATARACT EXTRACTION W/PHACO Right 03/15/2013   Procedure: CATARACT EXTRACTION PHACO AND INTRAOCULAR LENS PLACEMENT (IOC);  Surgeon: Williams Che, MD;  Location: [facility];  Service: Ophthalmology;  Laterality: Right;  CDE 16.15  . COLONOSCOPY    . EXCISION OF SKIN TAG Right 06/17/2016   Procedure: SHAVE OF SKIN TAG RIGHT BUTTOCK;  Surgeon: Aviva Signs, MD;  Location: [facility];  Service: General;  Laterality: Right;  . JOINT REPLACEMENT     left knee  . MASS EXCISION Left 06/17/2016   Procedure: EXCISION SKIN MALIGNANT LESION LEFT BUTTOCK;  Surgeon: Aviva Signs, MD;  Location: [facility];  Service: General;  Laterality: Left;  . PORTACATH PLACEMENT Right 07/28/2018   Procedure: INSERTION PORT-A-CATH WITH ULTRASOUND;  Surgeon: Rolm Bookbinder, MD;  Location: [facility];  Service: General;  Laterality: Right;  . PUNCH BIOPSY OF SKIN Right 07/28/2018   Procedure:  PUNCH BIOPSY OF SKIN RIGHT BREAST;  Surgeon: Rolm Bookbinder, MD;  Location: [facility];  Service: General;  Laterality: Right;  . TOTAL KNEE ARTHROPLASTY  12/16/2011   Procedure: TOTAL KNEE ARTHROPLASTY;  Surgeon: Gearlean Alf, MD;  Location: [facility];  Service: Orthopedics;  Laterality: Right;  . TUBAL LIGATION       SOCIAL HISTORY:  Social History   Socioeconomic History  . Marital status: Married    Spouse name: Not on file  . Number of children: 2  . Years of education: some business school after HS  . Highest education level: Not on file  Occupational History  . Occupation: Cabin crew  . Occupation: Network engineer   Social Needs  . Financial resource strain: Not hard at all  . Food insecurity    Worry: Never true    Inability: Never true  . Transportation needs    Medical: No    Non-medical: No  Tobacco Use  . Smoking status: Never Smoker  . Smokeless tobacco: Never Used  Substance and Sexual Activity  . Alcohol use: No    Frequency: Never  . Drug use: No  .  Sexual activity: Yes    Birth control/protection: None  Lifestyle  . Physical activity    Days per week: 0 days    Minutes per session: 0 min  . Stress: Not at all  Relationships  . Social connections    Talks on phone: More than three times a week    Gets together: More than three times a week    Attends religious service: More than 4 times per year    Active member of club or organization: Yes    Attends meetings of clubs or organizations: Never    Relationship status: Married  . Intimate partner violence    Fear of current or ex partner: No    Emotionally abused: No    Physically abused: No    Forced sexual activity: No  Other Topics Concern  . Not on file  Social History Narrative   Lives at home with her husband.   4 cups caffeine per day.   Right-handed.    FAMILY HISTORY:  Family History  Problem Relation Age of Onset  . Heart attack Mother   . Bronchitis Father   .  Diabetes Sister   . Leukemia Brother   . Lung disease Brother   . Lung disease Sister     CURRENT MEDICATIONS:  Outpatient Encounter Medications as of 06/01/2019  Medication Sig  . acetaminophen (TYLENOL) 325 MG tablet Take 650 mg by mouth every 6 (six) hours as needed.  Marland Kitchen aspirin 325 MG tablet Take 162.5 mg by mouth daily.   . cetirizine (ZYRTEC) 10 MG tablet Take 10 mg by mouth daily.  . hydrocortisone 1 % ointment Apply 1 application topically 2 (two) times daily.  Marland Kitchen lidocaine-prilocaine (EMLA) cream Apply to port site one hour prior to appointment and cover with plastic wrap.  . Misc. Devices MISC Please provide lymphedema sleeve for right arm  . Multiple Vitamin (MULTIVITAMIN WITH MINERALS) TABS tablet Take 1 tablet by mouth daily.  . Omega-3 Fatty Acids (FISH OIL PO) Take 1 capsule by mouth daily.   Glory Rosebush ULTRA test strip USE 1 STRIP TO CHECK GLUCOSE 2 TO 3 TIMES DAILY  . PACLitaxel (TAXOL IV) Inject into the vein once a week.  . prochlorperazine (COMPAZINE) 10 MG tablet Take 1 tablet (10 mg total) by mouth every 6 (six) hours as needed (Nausea or vomiting).  . Trastuzumab (HERCEPTIN IV) Inject into the vein once a week.   Marland Kitchen UNABLE TO FIND Take 1 capsule by mouth daily. Med Name: Tumeric Curcumin   No facility-administered encounter medications on file as of 06/01/2019.     ALLERGIES:  Allergies  Allergen Reactions  . Penicillins Rash    Has patient had a PCN reaction causing immediate rash, facial/tongue/throat swelling, SOB or lightheadedness with hypotension: No Has patient had a PCN reaction causing severe rash involving mucus membranes or skin necrosis: No Has patient had a PCN reaction that required hospitalization: No Has patient had a PCN reaction occurring within the last 10 years: No If all of the above answers are "NO", then may proceed with Cephalosporin use.      PHYSICAL EXAM:  ECOG Performance status: 1  Vitals:   06/01/19 0856  BP: (!) 143/70   Pulse: (!) 106  Resp: 18  Temp: (!) 97.3 F (36.3 C)  SpO2: 98%   Filed Weights   06/01/19 0856  Weight: 168 lb 3.2 oz (76.3 kg)    Physical Exam Constitutional:      Appearance: Normal appearance.  She is normal weight.  Cardiovascular:     Rate and Rhythm: Normal rate and regular rhythm.     Heart sounds: Normal heart sounds.  Pulmonary:     Effort: Pulmonary effort is normal.     Breath sounds: Normal breath sounds.  Abdominal:     General: Bowel sounds are normal.     Palpations: Abdomen is soft.  Musculoskeletal: Normal range of motion.  Skin:    General: Skin is warm and dry.  Neurological:     Mental Status: She is alert and oriented to person, place, and time. Mental status is at baseline.  Psychiatric:        Mood and Affect: Mood normal.        Behavior: Behavior normal.        Thought Content: Thought content normal.        Judgment: Judgment normal.    Right breast mass is stable.  No erythema over the breast mass or posterior chest wall.  LABORATORY DATA:  I have reviewed the labs as listed.  CBC    Component Value Date/Time   WBC 10.7 (H) 06/01/2019 0831   RBC 4.53 06/01/2019 0831   HGB 12.8 06/01/2019 0831   HGB 12.7 11/24/2017 1626   HCT 39.1 06/01/2019 0831   HCT 36.6 11/24/2017 1626   PLT 271 06/01/2019 0831   PLT 420 (H) 11/24/2017 1626   MCV 86.3 06/01/2019 0831   MCV 88 11/24/2017 1626   MCH 28.3 06/01/2019 0831   MCHC 32.7 06/01/2019 0831   RDW 19.9 (H) 06/01/2019 0831   RDW 15.4 11/24/2017 1626   LYMPHSABS 3.6 06/01/2019 0831   LYMPHSABS 2.5 11/24/2017 1626   MONOABS 1.3 (H) 06/01/2019 0831   EOSABS 0.5 06/01/2019 0831   EOSABS 0.1 11/24/2017 1626   BASOSABS 0.1 06/01/2019 0831   BASOSABS 0.0 11/24/2017 1626   CMP Latest Ref Rng & Units 06/01/2019 05/10/2019 04/19/2019  Glucose 70 - 99 mg/dL 116(H) 120(H) 112(H)  BUN 8 - 23 mg/dL 14 12 13   Creatinine 0.44 - 1.00 mg/dL 1.04(H) 0.94 1.17(H)  Sodium 135 - 145 mmol/L 135 134(L) 138   Potassium 3.5 - 5.1 mmol/L 3.6 3.7 3.8  Chloride 98 - 111 mmol/L 103 100 103  CO2 22 - 32 mmol/L 20(L) 22 24  Calcium 8.9 - 10.3 mg/dL 9.0 9.0 9.2  Total Protein 6.5 - 8.1 g/dL 7.1 6.9 7.0  Total Bilirubin 0.3 - 1.2 mg/dL 0.8 1.1 0.9  Alkaline Phos 38 - 126 U/L 129(H) 148(H) 147(H)  AST 15 - 41 U/L 80(H) 102(H) 120(H)  ALT 0 - 44 U/L 34 42 53(H)       DIAGNOSTIC IMAGING:  I have independently reviewed the scans and discussed with the patient.    ASSESSMENT & PLAN:   Malignant neoplasm of upper-outer quadrant of right breast in female, estrogen receptor negative ([place]) 1.  Metastatic right breast cancer to the skin, ER/PR negative, HER-2 positive: -Initial presentation with skin involvement of the right breast, axillary region and posterior chest wall. -Skin punch biopsy on 07/28/2018 of the right breast positive for dermal lymphatic invasion. -12 cycles of weekly Taxol and Herceptin from 07/31/2018 through 10/30/2018. -PET scan on 02/09/2019 with local progression of the disease with enlarging and increasing hypermetabolic right breast mass, multiple tiny 2-4 mm pulmonary nodules stable.  No definitive signs of metastatic disease elsewhere. - 5 cycles of Kadcyla 3 mg/kg from 02/15/2019 through 05/10/2019. - PET scan on 04/15/2019 showed persistent and intense  focal right breast hypermetabolism, slightly increased.  No evidence of metastatic disease. -Physical examination shows complete resolution of the erythema of the right breast, right axillary region and posterior chest wall with excellent response. -LFTs are also improving.  We have reviewed her labs.  She may proceed with next cycle of Kadcyla. - We plan to repeat PET scan in 4 months from the last scan.  We will also plan to repeat echocardiogram prior to next visit in 3 weeks.  2.  High risk drug monitoring: -Last echo on 03/24/2019 shows EF of 60 to 65%. -We will continue to monitor her ejection fraction periodically.  I plan to  repeat echo prior to next visit in 3 weeks.  Total time spent is 25 minutes with more than 50% of the time spent face-to-face discussing scan results, treatment plan and coordination of care.    Orders placed this encounter:  Orders Placed This Encounter  Procedures  . ECHOCARDIOGRAM COMPLETE      Derek Jack, [place] [phone]

## 2019-06-01 NOTE — Patient Instructions (Signed)
[facility] at [facility] Discharge Instructions  You were seen today by Dr. Katragadda. He went over your recent lab results. He will see you back in 3 weeks for labs and follow up.   Thank you for choosing [facility] at [facility] to provide your oncology and hematology care.  To afford each patient quality time with our provider, please arrive at least 15 minutes before your scheduled appointment time.   If you have a lab appointment with the Cancer Center please come in thru the  Main Entrance and check in at the main information desk  You need to re-schedule your appointment should you arrive 10 or more minutes late.  We strive to give you quality time with our providers, and arriving late affects you and other patients whose appointments are after yours.  Also, if you no show three or more times for appointments you may be dismissed from the clinic at the providers discretion.     Again, thank you for choosing [facility].  Our hope is that these requests will decrease the amount of time that you wait before being seen by our physicians.       _____________________________________________________________  Should you have questions after your visit to [facility], please contact our office at [phone] between the hours of 8:00 a.m. and 4:30 p.m.  Voicemails left after 4:00 p.m. will not be returned until the following business day.  For prescription refill requests, have your pharmacy contact our office and allow 72 hours.    Cancer Center Support Programs:   > Cancer Support Group  2nd Tuesday of the month 1pm-2pm, Journey Room    

## 2019-06-01 NOTE — Progress Notes (Signed)
0930 Labs reviewed with and pt seen by Dr. [name] and pt approved for Kadcyla infusion today per MD                [name] tolerated Kadcyla infusion well without complaints or incident. VSS upon discharge. Pt discharged via wheelchair in satisfactory condition

## 2019-06-08 DIAGNOSIS — R69 Illness, unspecified: Secondary | ICD-10-CM | POA: Diagnosis not present

## 2019-06-18 ENCOUNTER — Other Ambulatory Visit: Payer: Self-pay

## 2019-06-18 ENCOUNTER — Ambulatory Visit (HOSPITAL_COMMUNITY)
Admission: RE | Admit: 2019-06-18 | Discharge: 2019-06-18 | Disposition: A | Discharge status: Home / Self Care | Payer: Medicare HMO | Source: Ambulatory Visit | Attending: Hematology | Admitting: Hematology

## 2019-06-18 DIAGNOSIS — I350 Nonrheumatic aortic (valve) stenosis: Secondary | ICD-10-CM | POA: Insufficient documentation

## 2019-06-18 DIAGNOSIS — I700 Atherosclerosis of aorta: Secondary | ICD-10-CM | POA: Insufficient documentation

## 2019-06-18 DIAGNOSIS — C50411 Malignant neoplasm of upper-outer quadrant of right female breast: Secondary | ICD-10-CM | POA: Diagnosis not present

## 2019-06-18 DIAGNOSIS — E785 Hyperlipidemia, unspecified: Secondary | ICD-10-CM | POA: Diagnosis not present

## 2019-06-18 DIAGNOSIS — K219 Gastro-esophageal reflux disease without esophagitis: Secondary | ICD-10-CM | POA: Diagnosis not present

## 2019-06-18 DIAGNOSIS — I10 Essential (primary) hypertension: Secondary | ICD-10-CM | POA: Insufficient documentation

## 2019-06-18 DIAGNOSIS — Z171 Estrogen receptor negative status [ER-]: Secondary | ICD-10-CM | POA: Diagnosis not present

## 2019-06-18 NOTE — Progress Notes (Signed)
*  PRELIMINARY RESULTS* Echocardiogram 2D Echocardiogram has been performed.  Samuel Germany 06/18/2019, 11:40 AM

## 2019-06-22 ENCOUNTER — Inpatient Hospital Stay (HOSPITAL_BASED_OUTPATIENT_CLINIC_OR_DEPARTMENT_OTHER): Payer: Medicare HMO | Admitting: Hematology

## 2019-06-22 ENCOUNTER — Inpatient Hospital Stay (HOSPITAL_COMMUNITY): Payer: Medicare HMO

## 2019-06-22 ENCOUNTER — Encounter (HOSPITAL_COMMUNITY): Payer: Self-pay | Admitting: Hematology

## 2019-06-22 ENCOUNTER — Other Ambulatory Visit: Payer: Self-pay

## 2019-06-22 VITALS — BP 133/63 | HR 94 | Temp 98.0°F | Resp 18

## 2019-06-22 DIAGNOSIS — C50411 Malignant neoplasm of upper-outer quadrant of right female breast: Secondary | ICD-10-CM

## 2019-06-22 DIAGNOSIS — Z5112 Encounter for antineoplastic immunotherapy: Secondary | ICD-10-CM | POA: Diagnosis not present

## 2019-06-22 DIAGNOSIS — Z171 Estrogen receptor negative status [ER-]: Secondary | ICD-10-CM

## 2019-06-22 LAB — COMPREHENSIVE METABOLIC PANEL
ALT: 34 U/L (ref 0–44)
AST: 77 U/L — ABNORMAL HIGH (ref 15–41)
Albumin: 2.7 g/dL — ABNORMAL LOW (ref 3.5–5.0)
Alkaline Phosphatase: 128 U/L — ABNORMAL HIGH (ref 38–126)
Anion gap: 16 — ABNORMAL HIGH (ref 5–15)
BUN: 17 mg/dL (ref 8–23)
CO2: 15 mmol/L — ABNORMAL LOW (ref 22–32)
Calcium: 9 mg/dL (ref 8.9–10.3)
Chloride: 104 mmol/L (ref 98–111)
Creatinine, Ser: 1.03 mg/dL — ABNORMAL HIGH (ref 0.44–1.00)
GFR calc Af Amer: 57 mL/min — ABNORMAL LOW (ref >60)
GFR calc non Af Amer: 49 mL/min — ABNORMAL LOW (ref >60)
Glucose, Bld: 104 mg/dL — ABNORMAL HIGH (ref 70–99)
Potassium: 3.8 mmol/L (ref 3.5–5.1)
Sodium: 135 mmol/L (ref 135–145)
Total Bilirubin: 1 mg/dL (ref 0.3–1.2)
Total Protein: 7.1 g/dL (ref 6.5–8.1)

## 2019-06-22 LAB — CBC WITH DIFFERENTIAL/PLATELET
Abs Immature Granulocytes: 0.03 10*3/uL (ref 0.00–0.07)
Basophils Absolute: 0.1 10*3/uL (ref 0.0–0.1)
Basophils Relative: 1 %
Eosinophils Absolute: 0.7 10*3/uL — ABNORMAL HIGH (ref 0.0–0.5)
Eosinophils Relative: 7 %
HCT: 37.5 % (ref 36.0–46.0)
Hemoglobin: 12.2 g/dL (ref 12.0–15.0)
Immature Granulocytes: 0 %
Lymphocytes Relative: 33 %
Lymphs Abs: 3.4 10*3/uL (ref 0.7–4.0)
MCH: 28.6 pg (ref 26.0–34.0)
MCHC: 32.5 g/dL (ref 30.0–36.0)
MCV: 87.8 fL (ref 80.0–100.0)
Monocytes Absolute: 1.3 10*3/uL — ABNORMAL HIGH (ref 0.1–1.0)
Monocytes Relative: 12 %
Neutro Abs: 4.8 10*3/uL (ref 1.7–7.7)
Neutrophils Relative %: 47 %
Platelets: 270 10*3/uL (ref 150–400)
RBC: 4.27 MIL/uL (ref 3.87–5.11)
RDW: 19.7 % — ABNORMAL HIGH (ref 11.5–15.5)
WBC: 10.4 10*3/uL (ref 4.0–10.5)
nRBC: 0 % (ref 0.0–0.2)

## 2019-06-22 MED ORDER — SODIUM CHLORIDE 0.9 % IV SOLN
Freq: Once | INTRAVENOUS | Status: AC
  Administered 2019-06-22: 11:00:00 via INTRAVENOUS

## 2019-06-22 MED ORDER — DIPHENHYDRAMINE HCL 25 MG PO CAPS
50.0000 mg | ORAL_CAPSULE | Freq: Once | ORAL | Status: AC
  Administered 2019-06-22: 11:00:00 50 mg via ORAL
  Filled 2019-06-22: qty 2

## 2019-06-22 MED ORDER — HEPARIN SOD (PORK) LOCK FLUSH 100 UNIT/ML IV SOLN
500.0000 [IU] | Freq: Once | INTRAVENOUS | Status: AC | PRN
  Administered 2019-06-22: 500 [IU]

## 2019-06-22 MED ORDER — SODIUM CHLORIDE 0.9% FLUSH
10.0000 mL | INTRAVENOUS | Status: DC | PRN
  Administered 2019-06-22: 10 mL
  Filled 2019-06-22: qty 10

## 2019-06-22 MED ORDER — SODIUM CHLORIDE 0.9 % IV SOLN
3.0000 mg/kg | Freq: Once | INTRAVENOUS | Status: AC
  Administered 2019-06-22: 12:00:00 240 mg via INTRAVENOUS
  Filled 2019-06-22: qty 12

## 2019-06-22 MED ORDER — ACETAMINOPHEN 325 MG PO TABS
650.0000 mg | ORAL_TABLET | Freq: Once | ORAL | Status: AC
  Administered 2019-06-22: 650 mg via ORAL
  Filled 2019-06-22: qty 2

## 2019-06-22 NOTE — Patient Instructions (Addendum)
[facility] at [facility] Discharge Instructions  You were seen today by Dr. Katragadda. He went over your recent lab results. He will see you back in 3 weeks for labs and follow up.   Thank you for choosing [facility] at [facility] to provide your oncology and hematology care.  To afford each patient quality time with our provider, please arrive at least 15 minutes before your scheduled appointment time.   If you have a lab appointment with the Cancer Center please come in thru the  Main Entrance and check in at the main information desk  You need to re-schedule your appointment should you arrive 10 or more minutes late.  We strive to give you quality time with our providers, and arriving late affects you and other patients whose appointments are after yours.  Also, if you no show three or more times for appointments you may be dismissed from the clinic at the providers discretion.     Again, thank you for choosing [facility].  Our hope is that these requests will decrease the amount of time that you wait before being seen by our physicians.       _____________________________________________________________  Should you have questions after your visit to [facility], please contact our office at [phone] between the hours of 8:00 a.m. and 4:30 p.m.  Voicemails left after 4:00 p.m. will not be returned until the following business day.  For prescription refill requests, have your pharmacy contact our office and allow 72 hours.    Cancer Center Support Programs:   > Cancer Support Group  2nd Tuesday of the month 1pm-2pm, Journey Room    

## 2019-06-22 NOTE — Progress Notes (Signed)
[address] [place], [place] [address]   CLINIC:  Medical Oncology/Hematology  PCP:  [name], MD [address] [place] [address] [phone]   REASON FOR VISIT: Follow-up for right breast cancer, ER-/PR-/HER2+  CURRENT THERAPY: Kadcyla every 3 weeks   BRIEF ONCOLOGIC HISTORY:  Oncology History  Malignant neoplasm of upper-outer quadrant of right breast in female, estrogen receptor negative ([place])  07/03/2018 Initial Diagnosis   Palpable right breast mass for several months with skin discoloration, clinically skin trabecular thickening with nipple retraction measuring 7.2 x 4.3 x 4.2 cm, additional mass 4 o'clock position 2.6 cm, right axillary tail 1.2 cm right axillary lymph node 1.8 cm left breast irregular mass 1 cm, adjacent mass 0.9 cm together measuring 1.9 cm, no lymphadenopathy   07/03/2018 Pathology Results   Right breast biopsy: IDC grade 2, ER 0%, PR 0%, HER-2 positive, Ki-67 40%, lymph node biopsy negative, left breast biopsy complex sclerosing lesion    07/15/2018 Cancer Staging   Staging form: Breast, AJCC 8th Edition - Clinical: Stage IIB (cT3, cN0, cM0, G2, ER-, PR-, HER2+) - Signed by [name], MD on 07/15/2018   07/31/2018 - 10/30/2018 Chemotherapy   The patient had trastuzumab (HERCEPTIN) 336 mg in sodium chloride 0.9 % 250 mL chemo infusion, 4 mg/kg = 336 mg, Intravenous,  Once, 4 of 4 cycles Administration: 336 mg (07/31/2018), 168 mg (08/07/2018), 168 mg (08/28/2018), 168 mg (08/14/2018), 168 mg (08/21/2018), 168 mg (09/04/2018), 168 mg (09/11/2018), 168 mg (09/18/2018), 168 mg (10/02/2018), 168 mg (10/09/2018), 168 mg (10/16/2018), 168 mg (10/23/2018), 168 mg (10/30/2018) PACLitaxel (TAXOL) 78 mg in sodium chloride 0.9 % 250 mL chemo infusion (</= 55m/m2), 40 mg/m2 = 78 mg (50 % of original dose 80 mg/m2), Intravenous,  Once, 4 of 4 cycles Dose modification: 40 mg/m2 (50 % of original dose 80 mg/m2, Cycle 1, Reason: Patient  Age), 53.3333 mg/m2 (66.7 % of original dose 80 mg/m2, Cycle 1, Reason: Provider Judgment), 45 mg/m2 (original dose 80 mg/m2, Cycle 3, Reason: Provider Judgment) Administration: 78 mg (07/31/2018), 102 mg (08/07/2018), 102 mg (08/28/2018), 102 mg (08/14/2018), 102 mg (08/21/2018), 102 mg (09/04/2018), 102 mg (09/18/2018), 84 mg (10/02/2018), 84 mg (10/09/2018), 84 mg (10/16/2018), 84 mg (10/23/2018), 84 mg (10/30/2018)  for chemotherapy treatment.    11/13/2018 - 02/14/2019 Chemotherapy   The patient had trastuzumab (HERCEPTIN) 504 mg in sodium chloride 0.9 % 250 mL chemo infusion, 6 mg/kg = 504 mg (100 % of original dose 6 mg/kg), Intravenous,  Once, 4 of 5 cycles Dose modification: 6 mg/kg (original dose 6 mg/kg, Cycle 1, Reason: Other (see comments), Comment: pt receving it ) Administration: 504 mg (11/13/2018), 504 mg (12/04/2018), 504 mg (01/04/2019), 504 mg (01/25/2019)  for chemotherapy treatment.    02/15/2019 -  Chemotherapy   The patient had ado-trastuzumab emtansine (KADCYLA) 240 mg in sodium chloride 0.9 % 250 mL chemo infusion, 3 mg/kg = 240 mg (100 % of original dose 3 mg/kg), Intravenous, Once, 7 of 12 cycles Dose modification: 3 mg/kg (original dose 3 mg/kg, Cycle 1, Reason: Provider Judgment), 3 mg/kg (original dose 3 mg/kg, Cycle 2, Reason: Provider Judgment) Administration: 240 mg (02/15/2019), 240 mg (03/08/2019), 240 mg (05/10/2019), 240 mg (03/29/2019), 240 mg (04/19/2019), 240 mg (06/01/2019), 240 mg (06/22/2019)  for chemotherapy treatment.       CANCER STAGING: Cancer Staging Malignant neoplasm of upper-outer quadrant of right breast in female, estrogen receptor negative [place] Staging form: Breast, AJCC 8th Edition -  Clinical: Stage IIB (cT3, cN0, cM0, G2, ER-, PR-, HER2+) - Signed by [name], MD on 07/15/2018    INTERVAL HISTORY:  [name] 85 y.o. female seen for follow-up of right breast cancer.  Denies any symptoms of PND or orthopnea.  She is accompanied by her husband today.   Cycle 6 was given on 06/01/2019.  She denies any worsening of her baseline energy levels which are reported 50%.  Appetite is 100%.  No pains reported.  Denies any fevers or chills.  Denies any headaches or vision changes.    REVIEW OF SYSTEMS:  Review of Systems  Skin: Rash: inproved.  All other systems reviewed and are negative.    PAST MEDICAL/SURGICAL HISTORY:  Past Medical History:  Diagnosis Date  . Cancer ([place]) 06/2018   right breast cancer  . GERD (gastroesophageal reflux disease)    occasional   . Hyperlipidemia   . Hypertension    clearance with note Dr Nevada Crane on chart  . Pneumonia    2 years ago/ states occ cough with sinus drainage- no fever  . Thyroid nodule    with biopsy- states following medically   Past Surgical History:  Procedure Laterality Date  . CATARACT EXTRACTION W/PHACO  11/30/2012   Procedure: CATARACT EXTRACTION PHACO AND INTRAOCULAR LENS PLACEMENT (IOC);  Surgeon: Williams Che, MD;  Location: [facility];  Service: Ophthalmology;  Laterality: Left;  CDE:11.49  . CATARACT EXTRACTION W/PHACO Right 03/15/2013   Procedure: CATARACT EXTRACTION PHACO AND INTRAOCULAR LENS PLACEMENT (IOC);  Surgeon: Williams Che, MD;  Location: [facility];  Service: Ophthalmology;  Laterality: Right;  CDE 16.15  . COLONOSCOPY    . EXCISION OF SKIN TAG Right 06/17/2016   Procedure: SHAVE OF SKIN TAG RIGHT BUTTOCK;  Surgeon: Aviva Signs, MD;  Location: [facility];  Service: General;  Laterality: Right;  . JOINT REPLACEMENT     left knee  . MASS EXCISION Left 06/17/2016   Procedure: EXCISION SKIN MALIGNANT LESION LEFT BUTTOCK;  Surgeon: Aviva Signs, MD;  Location: [facility];  Service: General;  Laterality: Left;  . PORTACATH PLACEMENT Right 07/28/2018   Procedure: INSERTION PORT-A-CATH WITH ULTRASOUND;  Surgeon: Rolm Bookbinder, MD;  Location: [facility];  Service: General;  Laterality: Right;  . PUNCH BIOPSY OF SKIN Right 07/28/2018   Procedure: PUNCH BIOPSY OF SKIN RIGHT  BREAST;  Surgeon: Rolm Bookbinder, MD;  Location: [facility];  Service: General;  Laterality: Right;  . TOTAL KNEE ARTHROPLASTY  12/16/2011   Procedure: TOTAL KNEE ARTHROPLASTY;  Surgeon: Gearlean Alf, MD;  Location: [facility];  Service: Orthopedics;  Laterality: Right;  . TUBAL LIGATION       SOCIAL HISTORY:  Social History   Socioeconomic History  . Marital status: Married    Spouse name: Not on file  . Number of children: 2  . Years of education: some business school after HS  . Highest education level: Not on file  Occupational History  . Occupation: Cabin crew  . Occupation: Network engineer   Social Needs  . Financial resource strain: Not hard at all  . Food insecurity    Worry: Never true    Inability: Never true  . Transportation needs    Medical: No    Non-medical: No  Tobacco Use  . Smoking status: Never Smoker  . Smokeless tobacco: Never Used  Substance and Sexual Activity  . Alcohol use: No    Frequency: Never  . Drug use: No  . Sexual activity: Yes  Birth control/protection: None  Lifestyle  . Physical activity    Days per week: 0 days    Minutes per session: 0 min  . Stress: Not at all  Relationships  . Social connections    Talks on phone: More than three times a week    Gets together: More than three times a week    Attends religious service: More than 4 times per year    Active member of club or organization: Yes    Attends meetings of clubs or organizations: Never    Relationship status: Married  . Intimate partner violence    Fear of current or ex partner: No    Emotionally abused: No    Physically abused: No    Forced sexual activity: No  Other Topics Concern  . Not on file  Social History Narrative   Lives at home with her husband.   4 cups caffeine per day.   Right-handed.    FAMILY HISTORY:  Family History  Problem Relation Age of Onset  . Heart attack Mother   . Bronchitis Father   . Diabetes Sister   . Leukemia  Brother   . Lung disease Brother   . Lung disease Sister     CURRENT MEDICATIONS:  Outpatient Encounter Medications as of 06/22/2019  Medication Sig  . acetaminophen (TYLENOL) 325 MG tablet Take 650 mg by mouth every 6 (six) hours as needed.  Marland Kitchen aspirin 325 MG tablet Take 162.5 mg by mouth daily.   . cetirizine (ZYRTEC) 10 MG tablet Take 10 mg by mouth daily.  . hydrocortisone 1 % ointment Apply 1 application topically 2 (two) times daily.  Marland Kitchen lidocaine-prilocaine (EMLA) cream Apply to port site one hour prior to appointment and cover with plastic wrap.  . Misc. Devices MISC Please provide lymphedema sleeve for right arm  . Multiple Vitamin (MULTIVITAMIN WITH MINERALS) TABS tablet Take 1 tablet by mouth daily.  . Omega-3 Fatty Acids (FISH OIL PO) Take 1 capsule by mouth daily.   Glory Rosebush ULTRA test strip USE 1 STRIP TO CHECK GLUCOSE 2 TO 3 TIMES DAILY  . PACLitaxel (TAXOL IV) Inject into the vein once a week.  . prochlorperazine (COMPAZINE) 10 MG tablet Take 1 tablet (10 mg total) by mouth every 6 (six) hours as needed (Nausea or vomiting).  . Trastuzumab (HERCEPTIN IV) Inject into the vein once a week.   Marland Kitchen UNABLE TO FIND Take 1 capsule by mouth daily. Med Name: Tumeric Curcumin   No facility-administered encounter medications on file as of 06/22/2019.     ALLERGIES:  Allergies  Allergen Reactions  . Penicillins Rash    Has patient had a PCN reaction causing immediate rash, facial/tongue/throat swelling, SOB or lightheadedness with hypotension: No Has patient had a PCN reaction causing severe rash involving mucus membranes or skin necrosis: No Has patient had a PCN reaction that required hospitalization: No Has patient had a PCN reaction occurring within the last 10 years: No If all of the above answers are "NO", then may proceed with Cephalosporin use.      PHYSICAL EXAM:  ECOG Performance status: 1  Vitals:   06/22/19 0939  BP: (!) 152/81  Pulse: (!) 108  Resp: 18  Temp:  (!) 97.1 F (36.2 C)  SpO2: 98%   Filed Weights   06/22/19 0939  Weight: 167 lb 14.4 oz (76.2 kg)    Physical Exam Constitutional:      Appearance: Normal appearance. She is normal weight.  Cardiovascular:  Rate and Rhythm: Normal rate and regular rhythm.     Heart sounds: Normal heart sounds.  Pulmonary:     Effort: Pulmonary effort is normal.     Breath sounds: Normal breath sounds.  Abdominal:     General: Bowel sounds are normal.     Palpations: Abdomen is soft.  Musculoskeletal: Normal range of motion.  Skin:    General: Skin is warm and dry.  Neurological:     Mental Status: She is alert and oriented to person, place, and time. Mental status is at baseline.  Psychiatric:        Mood and Affect: Mood normal.        Behavior: Behavior normal.        Thought Content: Thought content normal.        Judgment: Judgment normal.    Right breast mass is stable.  No erythema over the breast mass or posterior chest wall.  LABORATORY DATA:  I have reviewed the labs as listed.  CBC    Component Value Date/Time   WBC 10.4 06/22/2019 0828   RBC 4.27 06/22/2019 0828   HGB 12.2 06/22/2019 0828   HGB 12.7 11/24/2017 1626   HCT 37.5 06/22/2019 0828   HCT 36.6 11/24/2017 1626   PLT 270 06/22/2019 0828   PLT 420 (H) 11/24/2017 1626   MCV 87.8 06/22/2019 0828   MCV 88 11/24/2017 1626   MCH 28.6 06/22/2019 0828   MCHC 32.5 06/22/2019 0828   RDW 19.7 (H) 06/22/2019 0828   RDW 15.4 11/24/2017 1626   LYMPHSABS 3.4 06/22/2019 0828   LYMPHSABS 2.5 11/24/2017 1626   MONOABS 1.3 (H) 06/22/2019 0828   EOSABS 0.7 (H) 06/22/2019 0828   EOSABS 0.1 11/24/2017 1626   BASOSABS 0.1 06/22/2019 0828   BASOSABS 0.0 11/24/2017 1626   CMP Latest Ref Rng & Units 06/22/2019 06/01/2019 05/10/2019  Glucose 70 - 99 mg/dL 104(H) 116(H) 120(H)  BUN 8 - 23 mg/dL _0 Creatinine 0.44 - 1.00 mg/dL 1.03(H) 1.04(H) 0.94  Sodium 135 - 145 mmol/L 135 135 134(L)  Potassium 3.5 - 5.1 mmol/L 3.8 3.6  3.7  Chloride 98 - 111 mmol/L 104 103 100  CO2 22 - 32 mmol/L 15(L) 20(L) 22  Calcium 8.9 - 10.3 mg/dL 9.0 9.0 9.0  Total Protein 6.5 - 8.1 g/dL 7.1 7.1 6.9  Total Bilirubin 0.3 - 1.2 mg/dL 1.0 0.8 1.1  Alkaline Phos 38 - 126 U/L 128(H) 129(H) 148(H)  AST 15 - 41 U/L 77(H) 80(H) 102(H)  ALT 0 - 44 U/L 34 34 42       DIAGNOSTIC IMAGING:  I have independently reviewed the scans and discussed with the patient.    ASSESSMENT & PLAN:   Malignant neoplasm of upper-outer quadrant of right breast in female, estrogen receptor negative ([place]) 1.  Metastatic right breast cancer to the skin, ER/PR negative, HER-2 positive: -Initial presentation with skin involvement of the right breast, axillary region and posterior chest wall. -Skin punch biopsy on 07/28/2018 of the right breast positive for DLI. -12 cycles of weekly Taxol and Herceptin from 07/31/2018 through 10/30/2018. - PET CT scan on 02/05/2019 showing local progression of disease with enlarging and increasing hypermetabolic right breast mass, multiple tiny 2-4 mm pulmonary nodules stable.  No definitive signs of metastatic disease elsewhere. - 6 cycles of Kadcyla 3 mg/KG from 02/15/2019 through 06/01/2019. - PET scan after 3 cycles on 04/15/2019 showed persistent and intense focal right breast hypermetabolism, slightly increased with no  evidence of metastatic disease. -Physical exam however showed complete resolution of erythema of the right breast, right axillary region and posterior chest wall with excellent response. - LFTs are also continuously improving.  She is tolerating Kadcyla very well. -She will proceed with her next cycle today.  We have reviewed her labs.  I plan to repeat PET scan in 4 months from the last scan.  We will also consider imaging of her brain at some point.  2.  High risk drug monitoring: -Last echo on 03/24/2019 shows EF of 60 to 65%. -We repeated the echocardiogram on 06/18/2019 which showed EF more than 65%,  hyperdynamic.  Total time spent is 25 minutes with more than 50% of the time spent face-to-face discussing scan results, treatment plan and coordination of care.    Orders placed this encounter:  No orders of the defined types were placed in this encounter.     Derek Jack, MD [place] [phone]

## 2019-06-22 NOTE — Progress Notes (Signed)
1020 Labs reviewed with and pt seen by Dr. [name] and pt approved for Kadcyla infusion today per MD                [name] tolerated Kadcyla infusion well without complaints or incident. VSS upon discharge. Pt discharged self ambulatory in satisfactory condition accompanied by her husband

## 2019-06-22 NOTE — Progress Notes (Signed)
06/22/19  OK to treat with HR 108 today.  T.O. Dr Beckey Downing LPN/Nayah Lukens Ronnald Ramp, PharmD

## 2019-06-22 NOTE — Assessment & Plan Note (Addendum)
1.  Metastatic right breast cancer to the skin, ER/PR negative, HER-2 positive: -Initial presentation with skin involvement of the right breast, axillary region and posterior chest wall. -Skin punch biopsy on 07/28/2018 of the right breast positive for DLI. -12 cycles of weekly Taxol and Herceptin from 07/31/2018 through 10/30/2018. - PET CT scan on 02/05/2019 showing local progression of disease with enlarging and increasing hypermetabolic right breast mass, multiple tiny 2-4 mm pulmonary nodules stable.  No definitive signs of metastatic disease elsewhere. - 6 cycles of Kadcyla 3 mg/KG from 02/15/2019 through 06/01/2019. - PET scan after 3 cycles on 04/15/2019 showed persistent and intense focal right breast hypermetabolism, slightly increased with no evidence of metastatic disease. -Physical exam however showed complete resolution of erythema of the right breast, right axillary region and posterior chest wall with excellent response. - LFTs are also continuously improving.  She is tolerating Kadcyla very well. -She will proceed with her next cycle today.  We have reviewed her labs.  I plan to repeat PET scan in 4 months from the last scan.  We will also consider imaging of her brain at some point.  2.  High risk drug monitoring: -Last echo on 03/24/2019 shows EF of 60 to 65%. -We repeated the echocardiogram on 06/18/2019 which showed EF more than 65%, hyperdynamic.

## 2019-06-22 NOTE — Patient Instructions (Signed)
[facility] Discharge Instructions for Patients Receiving Chemotherapy   Beginning January 23rd 2017 lab work for the Cancer Center will be done in the  Main lab at [facility] on 1st floor. If you have a lab appointment with the Cancer Center please come in thru the  Main Entrance and check in at the main information desk   Today you received the following chemotherapy agents Kadcyla. Follow-up as scheduled. Call clinic for any questions or concerns  To help prevent nausea and vomiting after your treatment, we encourage you to take your nausea medication   If you develop nausea and vomiting, or diarrhea that is not controlled by your medication, call the clinic.  The clinic phone number is [phone]. Office hours are Monday-Friday 8:30am-5:00pm.  BELOW ARE SYMPTOMS THAT SHOULD BE REPORTED IMMEDIATELY:  *FEVER GREATER THAN 101.0 F  *CHILLS WITH OR WITHOUT FEVER  NAUSEA AND VOMITING THAT IS NOT CONTROLLED WITH YOUR NAUSEA MEDICATION  *UNUSUAL SHORTNESS OF BREATH  *UNUSUAL BRUISING OR BLEEDING  TENDERNESS IN MOUTH AND THROAT WITH OR WITHOUT PRESENCE OF ULCERS  *URINARY PROBLEMS  *BOWEL PROBLEMS  UNUSUAL RASH Items with * indicate a potential emergency and should be followed up as soon as possible. If you have an emergency after office hours please contact your primary care physician or go to the nearest emergency department.  Please call the clinic during office hours if you have any questions or concerns.   You may also contact the Patient Navigator at [phone] should you have any questions or need assistance in obtaining follow up care.      Resources For Cancer Patients and their Caregivers ? American Cancer Society: Can assist with transportation, wigs, general needs, runs Look Good Feel Better.        1-888-227-6333 ? Cancer Care: Provides financial assistance, online support groups, medication/co-pay assistance.  1-800-813-HOPE  (4673) ? [facility] Assists [place] cancer patients and their families through emotional , educational and financial support.  336-427-4357 ? [place] DSS Where to apply for food stamps, Medicaid and utility assistance. 336-342-1394 ? [facility]: Transportation to medical appointments. 336-347-2287 ? Social Security Administration: May apply for disability if have a Stage IV cancer. 336-342-7796 1-800-772-1213 ? [place] Aging, Disability and Transit Services: Assists with nutrition, care and transit needs. 336-349-2343         

## 2019-06-24 DIAGNOSIS — R0902 Hypoxemia: Secondary | ICD-10-CM | POA: Diagnosis not present

## 2019-06-24 DIAGNOSIS — R5381 Other malaise: Secondary | ICD-10-CM | POA: Diagnosis not present

## 2019-06-28 DIAGNOSIS — C50919 Malignant neoplasm of unspecified site of unspecified female breast: Secondary | ICD-10-CM | POA: Diagnosis not present

## 2019-07-08 DIAGNOSIS — E559 Vitamin D deficiency, unspecified: Secondary | ICD-10-CM | POA: Diagnosis not present

## 2019-07-08 DIAGNOSIS — R7301 Impaired fasting glucose: Secondary | ICD-10-CM | POA: Diagnosis not present

## 2019-07-08 DIAGNOSIS — N183 Chronic kidney disease, stage 3 (moderate): Secondary | ICD-10-CM | POA: Diagnosis not present

## 2019-07-08 DIAGNOSIS — I10 Essential (primary) hypertension: Secondary | ICD-10-CM | POA: Diagnosis not present

## 2019-07-08 DIAGNOSIS — E782 Mixed hyperlipidemia: Secondary | ICD-10-CM | POA: Diagnosis not present

## 2019-07-08 DIAGNOSIS — E1121 Type 2 diabetes mellitus with diabetic nephropathy: Secondary | ICD-10-CM | POA: Diagnosis not present

## 2019-07-13 ENCOUNTER — Inpatient Hospital Stay (HOSPITAL_COMMUNITY): Payer: Medicare HMO

## 2019-07-13 ENCOUNTER — Other Ambulatory Visit: Payer: Self-pay

## 2019-07-13 ENCOUNTER — Encounter (HOSPITAL_COMMUNITY): Payer: Self-pay | Admitting: Hematology

## 2019-07-13 ENCOUNTER — Inpatient Hospital Stay (HOSPITAL_COMMUNITY): Payer: Medicare HMO | Attending: Hematology | Admitting: Hematology

## 2019-07-13 DIAGNOSIS — C50411 Malignant neoplasm of upper-outer quadrant of right female breast: Secondary | ICD-10-CM

## 2019-07-13 DIAGNOSIS — Z79899 Other long term (current) drug therapy: Secondary | ICD-10-CM | POA: Diagnosis not present

## 2019-07-13 DIAGNOSIS — Z5112 Encounter for antineoplastic immunotherapy: Secondary | ICD-10-CM | POA: Diagnosis present

## 2019-07-13 DIAGNOSIS — E785 Hyperlipidemia, unspecified: Secondary | ICD-10-CM | POA: Diagnosis not present

## 2019-07-13 DIAGNOSIS — I10 Essential (primary) hypertension: Secondary | ICD-10-CM | POA: Diagnosis not present

## 2019-07-13 DIAGNOSIS — K219 Gastro-esophageal reflux disease without esophagitis: Secondary | ICD-10-CM | POA: Diagnosis not present

## 2019-07-13 DIAGNOSIS — Z170 Estrogen receptor positive status [ER+]: Secondary | ICD-10-CM | POA: Insufficient documentation

## 2019-07-13 DIAGNOSIS — Z171 Estrogen receptor negative status [ER-]: Secondary | ICD-10-CM

## 2019-07-13 DIAGNOSIS — Z9221 Personal history of antineoplastic chemotherapy: Secondary | ICD-10-CM | POA: Diagnosis not present

## 2019-07-13 DIAGNOSIS — Z8701 Personal history of pneumonia (recurrent): Secondary | ICD-10-CM | POA: Insufficient documentation

## 2019-07-13 DIAGNOSIS — E041 Nontoxic single thyroid nodule: Secondary | ICD-10-CM | POA: Insufficient documentation

## 2019-07-13 LAB — COMPREHENSIVE METABOLIC PANEL
ALT: 42 U/L (ref 0–44)
AST: 98 U/L — ABNORMAL HIGH (ref 15–41)
Albumin: 2.7 g/dL — ABNORMAL LOW (ref 3.5–5.0)
Alkaline Phosphatase: 132 U/L — ABNORMAL HIGH (ref 38–126)
Anion gap: 11 (ref 5–15)
BUN: 16 mg/dL (ref 8–23)
CO2: 19 mmol/L — ABNORMAL LOW (ref 22–32)
Calcium: 8.9 mg/dL (ref 8.9–10.3)
Chloride: 104 mmol/L (ref 98–111)
Creatinine, Ser: 1.09 mg/dL — ABNORMAL HIGH (ref 0.44–1.00)
GFR calc Af Amer: 53 mL/min — ABNORMAL LOW (ref >60)
GFR calc non Af Amer: 46 mL/min — ABNORMAL LOW (ref >60)
Glucose, Bld: 124 mg/dL — ABNORMAL HIGH (ref 70–99)
Potassium: 3.2 mmol/L — ABNORMAL LOW (ref 3.5–5.1)
Sodium: 134 mmol/L — ABNORMAL LOW (ref 135–145)
Total Bilirubin: 1.3 mg/dL — ABNORMAL HIGH (ref 0.3–1.2)
Total Protein: 7.1 g/dL (ref 6.5–8.1)

## 2019-07-13 LAB — CBC WITH DIFFERENTIAL/PLATELET
Abs Immature Granulocytes: 0.02 10*3/uL (ref 0.00–0.07)
Basophils Absolute: 0.1 10*3/uL (ref 0.0–0.1)
Basophils Relative: 1 %
Eosinophils Absolute: 0.6 10*3/uL — ABNORMAL HIGH (ref 0.0–0.5)
Eosinophils Relative: 6 %
HCT: 36.3 % (ref 36.0–46.0)
Hemoglobin: 12 g/dL (ref 12.0–15.0)
Immature Granulocytes: 0 %
Lymphocytes Relative: 36 %
Lymphs Abs: 3.5 10*3/uL (ref 0.7–4.0)
MCH: 28.8 pg (ref 26.0–34.0)
MCHC: 33.1 g/dL (ref 30.0–36.0)
MCV: 87.3 fL (ref 80.0–100.0)
Monocytes Absolute: 1 10*3/uL (ref 0.1–1.0)
Monocytes Relative: 11 %
Neutro Abs: 4.4 10*3/uL (ref 1.7–7.7)
Neutrophils Relative %: 46 %
Platelets: 312 10*3/uL (ref 150–400)
RBC: 4.16 MIL/uL (ref 3.87–5.11)
RDW: 19.1 % — ABNORMAL HIGH (ref 11.5–15.5)
WBC: 9.6 10*3/uL (ref 4.0–10.5)
nRBC: 0 % (ref 0.0–0.2)

## 2019-07-13 MED ORDER — SODIUM CHLORIDE 0.9% FLUSH
10.0000 mL | Freq: Once | INTRAVENOUS | Status: AC
  Administered 2019-07-13: 08:00:00 10 mL via INTRAVENOUS

## 2019-07-13 MED ORDER — HEPARIN SOD (PORK) LOCK FLUSH 100 UNIT/ML IV SOLN
500.0000 [IU] | Freq: Once | INTRAVENOUS | Status: AC
  Administered 2019-07-13: 500 [IU] via INTRAVENOUS

## 2019-07-13 NOTE — Patient Instructions (Addendum)
[facility] at [facility] Discharge Instructions  You were seen today by Dr. Katragadda. He went over your recent lab results. He will see you back in 1 week for labs and follow up.   Thank you for choosing [facility] at [facility] to provide your oncology and hematology care.  To afford each patient quality time with our provider, please arrive at least 15 minutes before your scheduled appointment time.   If you have a lab appointment with the Cancer Center please come in thru the  Main Entrance and check in at the main information desk  You need to re-schedule your appointment should you arrive 10 or more minutes late.  We strive to give you quality time with our providers, and arriving late affects you and other patients whose appointments are after yours.  Also, if you no show three or more times for appointments you may be dismissed from the clinic at the providers discretion.     Again, thank you for choosing [facility].  Our hope is that these requests will decrease the amount of time that you wait before being seen by our physicians.       _____________________________________________________________  Should you have questions after your visit to [facility], please contact our office at [phone] between the hours of 8:00 a.m. and 4:30 p.m.  Voicemails left after 4:00 p.m. will not be returned until the following business day.  For prescription refill requests, have your pharmacy contact our office and allow 72 hours.    Cancer Center Support Programs:   > Cancer Support Group  2nd Tuesday of the month 1pm-2pm, Journey Room    

## 2019-07-13 NOTE — Assessment & Plan Note (Signed)
1.  Metastatic right breast cancer to the skin, ER/PR negative, HER-2 positive: - 7 cycles of Kadcyla from 02/15/2019 through 06/22/2019. -Physical exam today shows no erythema over the right chest wall or breast region.  Mass in the right breast is stable. -She has tolerated last cycle very well.  Denied any symptoms of PND or orthopnea. - We reviewed her labs today.  Bilirubin is elevated at 1.3.  I would hold her treatment today. -We will reevaluate her next week.  2.  High risk drug monitoring: - Echocardiogram on 06/18/2019 shows EF more than 65%, hyperdynamic.

## 2019-07-13 NOTE — Progress Notes (Signed)
[facility] [place], [place] [address]   CLINIC:  Medical Oncology/Hematology  PCP:  [name], MD [address] [place] [address] [phone]   REASON FOR VISIT: Follow-up for right breast cancer, ER-/PR-/HER2+  CURRENT THERAPY: Kadcyla every 3 weeks   BRIEF ONCOLOGIC HISTORY:  Oncology History  Malignant neoplasm of upper-outer quadrant of right breast in female, estrogen receptor negative ([place])  07/03/2018 Initial Diagnosis   Palpable right breast mass for several months with skin discoloration, clinically skin trabecular thickening with nipple retraction measuring 7.2 x 4.3 x 4.2 cm, additional mass 4 o'clock position 2.6 cm, right axillary tail 1.2 cm right axillary lymph node 1.8 cm left breast irregular mass 1 cm, adjacent mass 0.9 cm together measuring 1.9 cm, no lymphadenopathy   07/03/2018 Pathology Results   Right breast biopsy: IDC grade 2, ER 0%, PR 0%, HER-2 positive, Ki-67 40%, lymph node biopsy negative, left breast biopsy complex sclerosing lesion    07/15/2018 Cancer Staging   Staging form: Breast, AJCC 8th Edition - Clinical: Stage IIB (cT3, cN0, cM0, G2, ER-, PR-, HER2+) - Signed by [name], MD on 07/15/2018   07/31/2018 - 10/30/2018 Chemotherapy   The patient had trastuzumab (HERCEPTIN) 336 mg in sodium chloride 0.9 % 250 mL chemo infusion, 4 mg/kg = 336 mg, Intravenous,  Once, 4 of 4 cycles Administration: 336 mg (07/31/2018), 168 mg (08/07/2018), 168 mg (08/28/2018), 168 mg (08/14/2018), 168 mg (08/21/2018), 168 mg (09/04/2018), 168 mg (09/11/2018), 168 mg (09/18/2018), 168 mg (10/02/2018), 168 mg (10/09/2018), 168 mg (10/16/2018), 168 mg (10/23/2018), 168 mg (10/30/2018) PACLitaxel (TAXOL) 78 mg in sodium chloride 0.9 % 250 mL chemo infusion (</= 61m/m2), 40 mg/m2 = 78 mg (50 % of original dose 80 mg/m2), Intravenous,  Once, 4 of 4 cycles Dose modification: 40 mg/m2 (50 % of original dose 80 mg/m2, Cycle 1, Reason: Patient  Age), 53.3333 mg/m2 (66.7 % of original dose 80 mg/m2, Cycle 1, Reason: Provider Judgment), 45 mg/m2 (original dose 80 mg/m2, Cycle 3, Reason: Provider Judgment) Administration: 78 mg (07/31/2018), 102 mg (08/07/2018), 102 mg (08/28/2018), 102 mg (08/14/2018), 102 mg (08/21/2018), 102 mg (09/04/2018), 102 mg (09/18/2018), 84 mg (10/02/2018), 84 mg (10/09/2018), 84 mg (10/16/2018), 84 mg (10/23/2018), 84 mg (10/30/2018)  for chemotherapy treatment.    11/13/2018 - 02/14/2019 Chemotherapy   The patient had trastuzumab (HERCEPTIN) 504 mg in sodium chloride 0.9 % 250 mL chemo infusion, 6 mg/kg = 504 mg (100 % of original dose 6 mg/kg), Intravenous,  Once, 4 of 5 cycles Dose modification: 6 mg/kg (original dose 6 mg/kg, Cycle 1, Reason: Other (see comments), Comment: pt receving it ) Administration: 504 mg (11/13/2018), 504 mg (12/04/2018), 504 mg (01/04/2019), 504 mg (01/25/2019)  for chemotherapy treatment.    02/15/2019 -  Chemotherapy   The patient had ado-trastuzumab emtansine (KADCYLA) 240 mg in sodium chloride 0.9 % 250 mL chemo infusion, 3 mg/kg = 240 mg (100 % of original dose 3 mg/kg), Intravenous, Once, 7 of 12 cycles Dose modification: 3 mg/kg (original dose 3 mg/kg, Cycle 1, Reason: Provider Judgment), 3 mg/kg (original dose 3 mg/kg, Cycle 2, Reason: Provider Judgment) Administration: 240 mg (02/15/2019), 240 mg (03/08/2019), 240 mg (05/10/2019), 240 mg (03/29/2019), 240 mg (04/19/2019), 240 mg (06/01/2019), 240 mg (06/22/2019)  for chemotherapy treatment.       CANCER STAGING: Cancer Staging Malignant neoplasm of upper-outer quadrant of right breast in female, estrogen receptor negative ([place] Staging form: Breast, AJCC 8th Edition -  Clinical: Stage IIB (cT3, cN0, cM0, G2, ER-, PR-, HER2+) - Signed by [name], MD on 07/15/2018    INTERVAL HISTORY:  Ms. Canelo 85 y.o. female seen for follow-up of the right breast cancer.  She has received last Kadcyla on 06/22/2019.  Denies any symptoms of PND or  orthopnea.  Appetite is 100%.  Energy levels are 50%.  She is accompanied by her husband today.  Overall she is feeling very well.  Denies any nausea, vomiting, diarrhea or constipation.  Denies any fevers or infections.  No change in bowel habits noted.    REVIEW OF SYSTEMS:  Review of Systems  Skin: Rash: inproved.  All other systems reviewed and are negative.    PAST MEDICAL/SURGICAL HISTORY:  Past Medical History:  Diagnosis Date  . Cancer ([facility]) 06/2018   right breast cancer  . GERD (gastroesophageal reflux disease)    occasional   . Hyperlipidemia   . Hypertension    clearance with note Dr Nevada Crane on chart  . Pneumonia    2 years ago/ states occ cough with sinus drainage- no fever  . Thyroid nodule    with biopsy- states following medically   Past Surgical History:  Procedure Laterality Date  . CATARACT EXTRACTION W/PHACO  11/30/2012   Procedure: CATARACT EXTRACTION PHACO AND INTRAOCULAR LENS PLACEMENT (IOC);  Surgeon: Williams Che, MD;  Location: [facility];  Service: Ophthalmology;  Laterality: Left;  CDE:11.49  . CATARACT EXTRACTION W/PHACO Right 03/15/2013   Procedure: CATARACT EXTRACTION PHACO AND INTRAOCULAR LENS PLACEMENT (IOC);  Surgeon: Williams Che, MD;  Location: [facility];  Service: Ophthalmology;  Laterality: Right;  CDE 16.15  . COLONOSCOPY    . EXCISION OF SKIN TAG Right 06/17/2016   Procedure: SHAVE OF SKIN TAG RIGHT BUTTOCK;  Surgeon: Aviva Signs, MD;  Location: [facility];  Service: General;  Laterality: Right;  . JOINT REPLACEMENT     left knee  . MASS EXCISION Left 06/17/2016   Procedure: EXCISION SKIN MALIGNANT LESION LEFT BUTTOCK;  Surgeon: Aviva Signs, MD;  Location: [facility];  Service: General;  Laterality: Left;  . PORTACATH PLACEMENT Right 07/28/2018   Procedure: INSERTION PORT-A-CATH WITH ULTRASOUND;  Surgeon: Rolm Bookbinder, MD;  Location: [facility];  Service: General;  Laterality: Right;  . PUNCH BIOPSY OF SKIN Right 07/28/2018    Procedure: PUNCH BIOPSY OF SKIN RIGHT BREAST;  Surgeon: Rolm Bookbinder, MD;  Location: [facility];  Service: General;  Laterality: Right;  . TOTAL KNEE ARTHROPLASTY  12/16/2011   Procedure: TOTAL KNEE ARTHROPLASTY;  Surgeon: Gearlean Alf, MD;  Location: [facility];  Service: Orthopedics;  Laterality: Right;  . TUBAL LIGATION       SOCIAL HISTORY:  Social History   Socioeconomic History  . Marital status: Married    Spouse name: Not on file  . Number of children: 2  . Years of education: some business school after HS  . Highest education level: Not on file  Occupational History  . Occupation: Cabin crew  . Occupation: Network engineer   Social Needs  . Financial resource strain: Not hard at all  . Food insecurity    Worry: Never true    Inability: Never true  . Transportation needs    Medical: No    Non-medical: No  Tobacco Use  . Smoking status: Never Smoker  . Smokeless tobacco: Never Used  Substance and Sexual Activity  . Alcohol use: No    Frequency: Never  . Drug use: No  .  Sexual activity: Yes    Birth control/protection: None  Lifestyle  . Physical activity    Days per week: 0 days    Minutes per session: 0 min  . Stress: Not at all  Relationships  . Social connections    Talks on phone: More than three times a week    Gets together: More than three times a week    Attends religious service: More than 4 times per year    Active member of club or organization: Yes    Attends meetings of clubs or organizations: Never    Relationship status: Married  . Intimate partner violence    Fear of current or ex partner: No    Emotionally abused: No    Physically abused: No    Forced sexual activity: No  Other Topics Concern  . Not on file  Social History Narrative   Lives at home with her husband.   4 cups caffeine per day.   Right-handed.    FAMILY HISTORY:  Family History  Problem Relation Age of Onset  . Heart attack Mother   . Bronchitis Father    . Diabetes Sister   . Leukemia Brother   . Lung disease Brother   . Lung disease Sister     CURRENT MEDICATIONS:  Outpatient Encounter Medications as of 07/13/2019  Medication Sig  . aspirin 325 MG tablet Take 162.5 mg by mouth daily.   . cetirizine (ZYRTEC) 10 MG tablet Take 10 mg by mouth daily.  . Multiple Vitamin (MULTIVITAMIN WITH MINERALS) TABS tablet Take 1 tablet by mouth daily.  . Omega-3 Fatty Acids (FISH OIL PO) Take 1 capsule by mouth daily.   Glory Rosebush ULTRA test strip USE 1 STRIP TO CHECK GLUCOSE 2 TO 3 TIMES DAILY  . PACLitaxel (TAXOL IV) Inject into the vein once a week.  . Trastuzumab (HERCEPTIN IV) Inject into the vein once a week.   . [DISCONTINUED] UNABLE TO FIND Take 1 capsule by mouth daily. Med Name: Tumeric Curcumin  . acetaminophen (TYLENOL) 325 MG tablet Take 650 mg by mouth every 6 (six) hours as needed.  . hydrocortisone 1 % ointment Apply 1 application topically 2 (two) times daily. (Patient not taking: Reported on 07/13/2019)  . lidocaine-prilocaine (EMLA) cream Apply to port site one hour prior to appointment and cover with plastic wrap. (Patient not taking: Reported on 07/13/2019)  . Misc. Devices MISC Please provide lymphedema sleeve for right arm (Patient not taking: Reported on 07/13/2019)  . [DISCONTINUED] prochlorperazine (COMPAZINE) 10 MG tablet Take 1 tablet (10 mg total) by mouth every 6 (six) hours as needed (Nausea or vomiting).   No facility-administered encounter medications on file as of 07/13/2019.     ALLERGIES:  Allergies  Allergen Reactions  . Penicillins Rash    Has patient had a PCN reaction causing immediate rash, facial/tongue/throat swelling, SOB or lightheadedness with hypotension: No Has patient had a PCN reaction causing severe rash involving mucus membranes or skin necrosis: No Has patient had a PCN reaction that required hospitalization: No Has patient had a PCN reaction occurring within the last 10 years: No If all of the  above answers are "NO", then may proceed with Cephalosporin use.      PHYSICAL EXAM:  ECOG Performance status: 1  Vitals:   07/13/19 0820  BP: (!) 156/80  Pulse: (!) 111  Resp: 18  Temp: (!) 96.9 F (36.1 C)  SpO2: 98%   Filed Weights   07/13/19 0820  Weight:  165 lb 12.8 oz (75.2 kg)    Physical Exam Constitutional:      Appearance: Normal appearance. She is normal weight.  Cardiovascular:     Rate and Rhythm: Normal rate and regular rhythm.     Heart sounds: Normal heart sounds.  Pulmonary:     Effort: Pulmonary effort is normal.     Breath sounds: Normal breath sounds.  Abdominal:     General: Bowel sounds are normal.     Palpations: Abdomen is soft.  Musculoskeletal: Normal range of motion.  Skin:    General: Skin is warm and dry.  Neurological:     Mental Status: She is alert and oriented to person, place, and time. Mental status is at baseline.  Psychiatric:        Mood and Affect: Mood normal.        Behavior: Behavior normal.        Thought Content: Thought content normal.        Judgment: Judgment normal.    Right breast mass is stable.  No erythema over the breast mass or posterior chest wall.  LABORATORY DATA:  I have reviewed the labs as listed.  CBC    Component Value Date/Time   WBC 9.6 07/13/2019 0806   RBC 4.16 07/13/2019 0806   HGB 12.0 07/13/2019 0806   HGB 12.7 11/24/2017 1626   HCT 36.3 07/13/2019 0806   HCT 36.6 11/24/2017 1626   PLT 312 07/13/2019 0806   PLT 420 (H) 11/24/2017 1626   MCV 87.3 07/13/2019 0806   MCV 88 11/24/2017 1626   MCH 28.8 07/13/2019 0806   MCHC 33.1 07/13/2019 0806   RDW 19.1 (H) 07/13/2019 0806   RDW 15.4 11/24/2017 1626   LYMPHSABS 3.5 07/13/2019 0806   LYMPHSABS 2.5 11/24/2017 1626   MONOABS 1.0 07/13/2019 0806   EOSABS 0.6 (H) 07/13/2019 0806   EOSABS 0.1 11/24/2017 1626   BASOSABS 0.1 07/13/2019 0806   BASOSABS 0.0 11/24/2017 1626   CMP Latest Ref Rng & Units 07/13/2019 06/22/2019 06/01/2019   Glucose 70 - 99 mg/dL 124(H) 104(H) 116(H)  BUN 8 - 23 mg/dL _0 Creatinine 0.44 - 1.00 mg/dL 1.09(H) 1.03(H) 1.04(H)  Sodium 135 - 145 mmol/L 134(L) 135 135  Potassium 3.5 - 5.1 mmol/L 3.2(L) 3.8 3.6  Chloride 98 - 111 mmol/L 104 104 103  CO2 22 - 32 mmol/L 19(L) 15(L) 20(L)  Calcium 8.9 - 10.3 mg/dL 8.9 9.0 9.0  Total Protein 6.5 - 8.1 g/dL 7.1 7.1 7.1  Total Bilirubin 0.3 - 1.2 mg/dL 1.3(H) 1.0 0.8  Alkaline Phos 38 - 126 U/L 132(H) 128(H) 129(H)  AST 15 - 41 U/L 98(H) 77(H) 80(H)  ALT 0 - 44 U/L 42 34 34       DIAGNOSTIC IMAGING:  I have independently reviewed the scans and discussed with the patient.    ASSESSMENT & PLAN:   Malignant neoplasm of upper-outer quadrant of right breast in female, estrogen receptor negative ([place]) 1.  Metastatic right breast cancer to the skin, ER/PR negative, HER-2 positive: - 7 cycles of Kadcyla from 02/15/2019 through 06/22/2019. -Physical exam today shows no erythema over the right chest wall or breast region.  Mass in the right breast is stable. -She has tolerated last cycle very well.  Denied any symptoms of PND or orthopnea. - We reviewed her labs today.  Bilirubin is elevated at 1.3.  I would hold her treatment today. -We will reevaluate her next week.  2.  High risk drug monitoring: - Echocardiogram on 06/18/2019 shows EF more than 65%, hyperdynamic.  Total time spent is 25 minutes with more than 50% of the time spent face-to-face discussing scan results, treatment plan and coordination of care.    Orders placed this encounter:  No orders of the defined types were placed in this encounter.     Derek Jack, MD [place] [phone]

## 2019-07-13 NOTE — Progress Notes (Signed)
Treatment held today due to labs verbal order Dr. [name].  Return next week.    Patients port flushed without difficulty.  Good blood return noted with no bruising or swelling noted at site.  Band aid applied.  VSS with discharge and left ambulatory with no s/s of distress noted.

## 2019-07-16 DIAGNOSIS — E782 Mixed hyperlipidemia: Secondary | ICD-10-CM | POA: Diagnosis not present

## 2019-07-16 DIAGNOSIS — E663 Overweight: Secondary | ICD-10-CM | POA: Diagnosis not present

## 2019-07-16 DIAGNOSIS — N183 Chronic kidney disease, stage 3 (moderate): Secondary | ICD-10-CM | POA: Diagnosis not present

## 2019-07-16 DIAGNOSIS — R7301 Impaired fasting glucose: Secondary | ICD-10-CM | POA: Diagnosis not present

## 2019-07-16 DIAGNOSIS — I129 Hypertensive chronic kidney disease with stage 1 through stage 4 chronic kidney disease, or unspecified chronic kidney disease: Secondary | ICD-10-CM | POA: Diagnosis not present

## 2019-07-16 DIAGNOSIS — E559 Vitamin D deficiency, unspecified: Secondary | ICD-10-CM | POA: Diagnosis not present

## 2019-07-16 DIAGNOSIS — R945 Abnormal results of liver function studies: Secondary | ICD-10-CM | POA: Diagnosis not present

## 2019-07-16 DIAGNOSIS — C50911 Malignant neoplasm of unspecified site of right female breast: Secondary | ICD-10-CM | POA: Diagnosis not present

## 2019-07-16 DIAGNOSIS — Z6828 Body mass index (BMI) 28.0-28.9, adult: Secondary | ICD-10-CM | POA: Diagnosis not present

## 2019-07-16 DIAGNOSIS — R7303 Prediabetes: Secondary | ICD-10-CM | POA: Diagnosis not present

## 2019-07-20 ENCOUNTER — Inpatient Hospital Stay (HOSPITAL_BASED_OUTPATIENT_CLINIC_OR_DEPARTMENT_OTHER): Payer: Medicare HMO | Admitting: Hematology

## 2019-07-20 ENCOUNTER — Encounter (HOSPITAL_COMMUNITY): Payer: Self-pay | Admitting: Hematology

## 2019-07-20 ENCOUNTER — Inpatient Hospital Stay (HOSPITAL_COMMUNITY): Payer: Medicare HMO

## 2019-07-20 ENCOUNTER — Encounter (HOSPITAL_COMMUNITY): Payer: Self-pay

## 2019-07-20 ENCOUNTER — Other Ambulatory Visit: Payer: Self-pay

## 2019-07-20 VITALS — BP 129/74 | HR 99 | Temp 97.6°F | Resp 18

## 2019-07-20 VITALS — BP 151/75 | HR 112 | Temp 97.1°F | Resp 18 | Wt 169.0 lb

## 2019-07-20 DIAGNOSIS — Z171 Estrogen receptor negative status [ER-]: Secondary | ICD-10-CM | POA: Diagnosis not present

## 2019-07-20 DIAGNOSIS — C50411 Malignant neoplasm of upper-outer quadrant of right female breast: Secondary | ICD-10-CM

## 2019-07-20 DIAGNOSIS — Z5112 Encounter for antineoplastic immunotherapy: Secondary | ICD-10-CM | POA: Diagnosis not present

## 2019-07-20 LAB — CBC WITH DIFFERENTIAL/PLATELET
Abs Immature Granulocytes: 0.02 10*3/uL (ref 0.00–0.07)
Basophils Absolute: 0.1 10*3/uL (ref 0.0–0.1)
Basophils Relative: 1 %
Eosinophils Absolute: 0.4 10*3/uL (ref 0.0–0.5)
Eosinophils Relative: 5 %
HCT: 35.2 % — ABNORMAL LOW (ref 36.0–46.0)
Hemoglobin: 11.7 g/dL — ABNORMAL LOW (ref 12.0–15.0)
Immature Granulocytes: 0 %
Lymphocytes Relative: 30 %
Lymphs Abs: 2.8 10*3/uL (ref 0.7–4.0)
MCH: 29.3 pg (ref 26.0–34.0)
MCHC: 33.2 g/dL (ref 30.0–36.0)
MCV: 88 fL (ref 80.0–100.0)
Monocytes Absolute: 1.2 10*3/uL — ABNORMAL HIGH (ref 0.1–1.0)
Monocytes Relative: 12 %
Neutro Abs: 5 10*3/uL (ref 1.7–7.7)
Neutrophils Relative %: 52 %
Platelets: 269 10*3/uL (ref 150–400)
RBC: 4 MIL/uL (ref 3.87–5.11)
RDW: 18.5 % — ABNORMAL HIGH (ref 11.5–15.5)
WBC: 9.5 10*3/uL (ref 4.0–10.5)
nRBC: 0 % (ref 0.0–0.2)

## 2019-07-20 LAB — COMPREHENSIVE METABOLIC PANEL
ALT: 44 U/L (ref 0–44)
AST: 92 U/L — ABNORMAL HIGH (ref 15–41)
Albumin: 2.6 g/dL — ABNORMAL LOW (ref 3.5–5.0)
Alkaline Phosphatase: 132 U/L — ABNORMAL HIGH (ref 38–126)
Anion gap: 8 (ref 5–15)
BUN: 15 mg/dL (ref 8–23)
CO2: 22 mmol/L (ref 22–32)
Calcium: 9.1 mg/dL (ref 8.9–10.3)
Chloride: 105 mmol/L (ref 98–111)
Creatinine, Ser: 1.07 mg/dL — ABNORMAL HIGH (ref 0.44–1.00)
GFR calc Af Amer: 54 mL/min — ABNORMAL LOW (ref >60)
GFR calc non Af Amer: 47 mL/min — ABNORMAL LOW (ref >60)
Glucose, Bld: 112 mg/dL — ABNORMAL HIGH (ref 70–99)
Potassium: 3.4 mmol/L — ABNORMAL LOW (ref 3.5–5.1)
Sodium: 135 mmol/L (ref 135–145)
Total Bilirubin: 0.6 mg/dL (ref 0.3–1.2)
Total Protein: 6.8 g/dL (ref 6.5–8.1)

## 2019-07-20 MED ORDER — DIPHENHYDRAMINE HCL 25 MG PO CAPS
50.0000 mg | ORAL_CAPSULE | Freq: Once | ORAL | Status: AC
  Administered 2019-07-20: 50 mg via ORAL

## 2019-07-20 MED ORDER — SODIUM CHLORIDE 0.9 % IV SOLN
3.0000 mg/kg | Freq: Once | INTRAVENOUS | Status: AC
  Administered 2019-07-20: 240 mg via INTRAVENOUS
  Filled 2019-07-20: qty 4

## 2019-07-20 MED ORDER — ACETAMINOPHEN 325 MG PO TABS
ORAL_TABLET | ORAL | Status: AC
  Filled 2019-07-20: qty 2

## 2019-07-20 MED ORDER — DIPHENHYDRAMINE HCL 25 MG PO CAPS
ORAL_CAPSULE | ORAL | Status: AC
  Filled 2019-07-20: qty 2

## 2019-07-20 MED ORDER — ACETAMINOPHEN 325 MG PO TABS
650.0000 mg | ORAL_TABLET | Freq: Once | ORAL | Status: AC
  Administered 2019-07-20: 650 mg via ORAL

## 2019-07-20 MED ORDER — SODIUM CHLORIDE 0.9% FLUSH
10.0000 mL | INTRAVENOUS | Status: DC | PRN
  Administered 2019-07-20: 10 mL
  Filled 2019-07-20: qty 10

## 2019-07-20 MED ORDER — HEPARIN SOD (PORK) LOCK FLUSH 100 UNIT/ML IV SOLN
500.0000 [IU] | Freq: Once | INTRAVENOUS | Status: AC | PRN
  Administered 2019-07-20: 500 [IU]

## 2019-07-20 MED ORDER — SODIUM CHLORIDE 0.9 % IV SOLN
Freq: Once | INTRAVENOUS | Status: AC
  Administered 2019-07-20: 10:00:00 via INTRAVENOUS

## 2019-07-20 NOTE — Progress Notes (Signed)
[place], [place] [address]   CLINIC:  Medical Oncology/Hematology  PCP:  [name], MD [address] [address] [phone]   REASON FOR VISIT: Follow-up for right breast cancer, ER-/PR-/HER2+  CURRENT THERAPY: Kadcyla every 3 weeks   BRIEF ONCOLOGIC HISTORY:  Oncology History  Malignant neoplasm of upper-outer quadrant of right breast in female, estrogen receptor negative ([place])  07/03/2018 Initial Diagnosis   Palpable right breast mass for several months with skin discoloration, clinically skin trabecular thickening with nipple retraction measuring 7.2 x 4.3 x 4.2 cm, additional mass 4 o'clock position 2.6 cm, right axillary tail 1.2 cm right axillary lymph node 1.8 cm left breast irregular mass 1 cm, adjacent mass 0.9 cm together measuring 1.9 cm, no lymphadenopathy   07/03/2018 Pathology Results   Right breast biopsy: IDC grade 2, ER 0%, PR 0%, HER-2 positive, Ki-67 40%, lymph node biopsy negative, left breast biopsy complex sclerosing lesion    07/15/2018 Cancer Staging   Staging form: Breast, AJCC 8th Edition - Clinical: Stage IIB (cT3, cN0, cM0, G2, ER-, PR-, HER2+) - Signed by [name], MD on 07/15/2018   07/31/2018 - 10/30/2018 Chemotherapy   The patient had trastuzumab (HERCEPTIN) 336 mg in sodium chloride 0.9 % 250 mL chemo infusion, 4 mg/kg = 336 mg, Intravenous,  Once, 4 of 4 cycles Administration: 336 mg (07/31/2018), 168 mg (08/07/2018), 168 mg (08/28/2018), 168 mg (08/14/2018), 168 mg (08/21/2018), 168 mg (09/04/2018), 168 mg (09/11/2018), 168 mg (09/18/2018), 168 mg (10/02/2018), 168 mg (10/09/2018), 168 mg (10/16/2018), 168 mg (10/23/2018), 168 mg (10/30/2018) PACLitaxel (TAXOL) 78 mg in sodium chloride 0.9 % 250 mL chemo infusion (</= 85m/m2), 40 mg/m2 = 78 mg (50 % of original dose 80 mg/m2), Intravenous,  Once, 4 of 4 cycles Dose modification: 40 mg/m2 (50 % of original dose 80 mg/m2, Cycle 1, Reason: Patient  Age), 53.3333 mg/m2 (66.7 % of original dose 80 mg/m2, Cycle 1, Reason: Provider Judgment), 45 mg/m2 (original dose 80 mg/m2, Cycle 3, Reason: Provider Judgment) Administration: 78 mg (07/31/2018), 102 mg (08/07/2018), 102 mg (08/28/2018), 102 mg (08/14/2018), 102 mg (08/21/2018), 102 mg (09/04/2018), 102 mg (09/18/2018), 84 mg (10/02/2018), 84 mg (10/09/2018), 84 mg (10/16/2018), 84 mg (10/23/2018), 84 mg (10/30/2018)  for chemotherapy treatment.    11/13/2018 - 02/14/2019 Chemotherapy   The patient had trastuzumab (HERCEPTIN) 504 mg in sodium chloride 0.9 % 250 mL chemo infusion, 6 mg/kg = 504 mg (100 % of original dose 6 mg/kg), Intravenous,  Once, 4 of 5 cycles Dose modification: 6 mg/kg (original dose 6 mg/kg, Cycle 1, Reason: Other (see comments), Comment: pt receving it ) Administration: 504 mg (11/13/2018), 504 mg (12/04/2018), 504 mg (01/04/2019), 504 mg (01/25/2019)  for chemotherapy treatment.    02/15/2019 -  Chemotherapy   The patient had ado-trastuzumab emtansine (KADCYLA) 240 mg in sodium chloride 0.9 % 250 mL chemo infusion, 3 mg/kg = 240 mg (100 % of original dose 3 mg/kg), Intravenous, Once, 8 of 12 cycles Dose modification: 3 mg/kg (original dose 3 mg/kg, Cycle 1, Reason: Provider Judgment), 3 mg/kg (original dose 3 mg/kg, Cycle 2, Reason: Provider Judgment) Administration: 240 mg (02/15/2019), 240 mg (03/08/2019), 240 mg (05/10/2019), 240 mg (03/29/2019), 240 mg (04/19/2019), 240 mg (06/01/2019), 240 mg (06/22/2019), 240 mg (07/20/2019)  for chemotherapy treatment.       CANCER STAGING: Cancer Staging Malignant neoplasm of upper-outer quadrant of right breast in female, estrogen receptor negative ([place] Staging form: Breast,  AJCC 8th Edition - Clinical: Stage IIB (cT3, cN0, cM0, G2, ER-, PR-, HER2+) - Signed by [name], MD on 07/15/2018    INTERVAL HISTORY:  [name] 85 y.o. female seen for follow-up of right breast cancer.  Since last week, she denied any right upper quadrant abdominal  pain after eating.  Appetite is 100%.  Energy levels are 50%.  No signs or symptoms of PND or orthopnea.  Denies any tingling or numbness in extremities.  No skin rashes reported.    REVIEW OF SYSTEMS:  Review of Systems  Skin: Rash: inproved.  All other systems reviewed and are negative.    PAST MEDICAL/SURGICAL HISTORY:  Past Medical History:  Diagnosis Date   Cancer ([place]) 06/2018   right breast cancer   GERD (gastroesophageal reflux disease)    occasional    Hyperlipidemia    Hypertension    clearance with note Dr Nevada Crane on chart   Pneumonia    2 years ago/ states occ cough with sinus drainage- no fever   Thyroid nodule    with biopsy- states following medically   Past Surgical History:  Procedure Laterality Date   CATARACT EXTRACTION [facility]  11/30/2012   Procedure: CATARACT EXTRACTION PHACO AND INTRAOCULAR LENS PLACEMENT ([place]);  Surgeon: Williams Che, MD;  Location: [facility];  Service: Ophthalmology;  Laterality: Left;  CDE:11.49   CATARACT EXTRACTION W/PHACO Right 03/15/2013   Procedure: CATARACT EXTRACTION PHACO AND INTRAOCULAR LENS PLACEMENT ([facility]);  Surgeon: Williams Che, MD;  Location: [facility];  Service: Ophthalmology;  Laterality: Right;  CDE 16.15   COLONOSCOPY     EXCISION OF SKIN TAG Right 06/17/2016   Procedure: SHAVE OF SKIN TAG RIGHT BUTTOCK;  Surgeon: Aviva Signs, MD;  Location: [facility];  Service: General;  Laterality: Right;   JOINT REPLACEMENT     left knee   MASS EXCISION Left 06/17/2016   Procedure: EXCISION SKIN MALIGNANT LESION LEFT BUTTOCK;  Surgeon: Aviva Signs, MD;  Location: [facility];  Service: General;  Laterality: Left;   PORTACATH PLACEMENT Right 07/28/2018   Procedure: INSERTION PORT-A-CATH WITH ULTRASOUND;  Surgeon: Rolm Bookbinder, MD;  Location: [facility];  Service: General;  Laterality: Right;   PUNCH BIOPSY OF SKIN Right 07/28/2018   Procedure: PUNCH BIOPSY OF SKIN RIGHT BREAST;  Surgeon: Rolm Bookbinder, MD;   Location: [facility];  Service: General;  Laterality: Right;   TOTAL KNEE ARTHROPLASTY  12/16/2011   Procedure: TOTAL KNEE ARTHROPLASTY;  Surgeon: Gearlean Alf, MD;  Location: [facility];  Service: Orthopedics;  Laterality: Right;   TUBAL LIGATION       SOCIAL HISTORY:  Social History   Socioeconomic History   Marital status: Married    Spouse name: Not on file   Number of children: 2   Years of education: some business school after HS   Highest education level: Not on file  Occupational History   Occupation: Cabin crew   Occupation: Engineer, manufacturing strain: Not hard at all   Food insecurity    Worry: Never true    Inability: Never true   Transportation needs    Medical: No    Non-medical: No  Tobacco Use   Smoking status: Never Smoker   Smokeless tobacco: Never Used  Substance and Sexual Activity   Alcohol use: No    Frequency: Never   Drug use: No   Sexual activity: Yes    Birth control/protection: None  Lifestyle  Physical activity    Days per week: 0 days    Minutes per session: 0 min   Stress: Not at all  Relationships   Social connections    Talks on phone: More than three times a week    Gets together: More than three times a week    Attends religious service: More than 4 times per year    Active member of club or organization: Yes    Attends meetings of clubs or organizations: Never    Relationship status: Married   Intimate partner violence    Fear of current or ex partner: No    Emotionally abused: No    Physically abused: No    Forced sexual activity: No  Other Topics Concern   Not on file  Social History Narrative   Lives at home with her husband.   4 cups caffeine per day.   Right-handed.    FAMILY HISTORY:  Family History  Problem Relation Age of Onset   Heart attack Mother    Bronchitis Father    Diabetes Sister    Leukemia Brother    Lung disease Brother    Lung  disease Sister     CURRENT MEDICATIONS:  Outpatient Encounter Medications as of 07/20/2019  Medication Sig   acetaminophen (TYLENOL) 325 MG tablet Take 650 mg by mouth every 6 (six) hours as needed.   aspirin 325 MG tablet Take 162.5 mg by mouth daily.    cetirizine (ZYRTEC) 10 MG tablet Take 10 mg by mouth daily.   hydrocortisone 1 % ointment Apply 1 application topically 2 (two) times daily. (Patient not taking: Reported on 07/13/2019)   lidocaine-prilocaine (EMLA) cream Apply to port site one hour prior to appointment and cover with plastic wrap. (Patient not taking: Reported on 07/13/2019)   Misc. Devices MISC Please provide lymphedema sleeve for right arm (Patient not taking: Reported on 07/13/2019)   Multiple Vitamin (MULTIVITAMIN WITH MINERALS) TABS tablet Take 1 tablet by mouth daily.   Omega-3 Fatty Acids (FISH OIL PO) Take 1 capsule by mouth daily.    ONETOUCH ULTRA test strip USE 1 STRIP TO CHECK GLUCOSE 2 TO 3 TIMES DAILY   PACLitaxel (TAXOL IV) Inject into the vein once a week.   Trastuzumab (HERCEPTIN IV) Inject into the vein once a week.    No facility-administered encounter medications on file as of 07/20/2019.     ALLERGIES:  Allergies  Allergen Reactions   Penicillins Rash    Has patient had a PCN reaction causing immediate rash, facial/tongue/throat swelling, SOB or lightheadedness with hypotension: No Has patient had a PCN reaction causing severe rash involving mucus membranes or skin necrosis: No Has patient had a PCN reaction that required hospitalization: No Has patient had a PCN reaction occurring within the last 10 years: No If all of the above answers are "NO", then may proceed with Cephalosporin use.      PHYSICAL EXAM:  ECOG Performance status: 1  Vitals:   07/20/19 0819  BP: (!) 151/75  Pulse: (!) 112  Resp: 18  Temp: (!) 97.1 F (36.2 C)  SpO2: 98%   Filed Weights   07/20/19 0819  Weight: 169 lb (76.7 kg)    Physical  Exam Constitutional:      Appearance: Normal appearance. She is normal weight.  Cardiovascular:     Rate and Rhythm: Normal rate and regular rhythm.     Heart sounds: Normal heart sounds.  Pulmonary:     Effort: Pulmonary effort  is normal.     Breath sounds: Normal breath sounds.  Abdominal:     General: Bowel sounds are normal.     Palpations: Abdomen is soft.  Musculoskeletal: Normal range of motion.  Skin:    General: Skin is warm and dry.  Neurological:     Mental Status: She is alert and oriented to person, place, and time. Mental status is at baseline.  Psychiatric:        Mood and Affect: Mood normal.        Behavior: Behavior normal.        Thought Content: Thought content normal.        Judgment: Judgment normal.    Right breast mass is stable.  No erythema over the breast mass or posterior chest wall.  LABORATORY DATA:  I have reviewed the labs as listed.  CBC    Component Value Date/Time   WBC 9.5 07/20/2019 0825   RBC 4.00 07/20/2019 0825   HGB 11.7 (L) 07/20/2019 0825   HGB 12.7 11/24/2017 1626   HCT 35.2 (L) 07/20/2019 0825   HCT 36.6 11/24/2017 1626   PLT 269 07/20/2019 0825   PLT 420 (H) 11/24/2017 1626   MCV 88.0 07/20/2019 0825   MCV 88 11/24/2017 1626   MCH 29.3 07/20/2019 0825   MCHC 33.2 07/20/2019 0825   RDW 18.5 (H) 07/20/2019 0825   RDW 15.4 11/24/2017 1626   LYMPHSABS 2.8 07/20/2019 0825   LYMPHSABS 2.5 11/24/2017 1626   MONOABS 1.2 (H) 07/20/2019 0825   EOSABS 0.4 07/20/2019 0825   EOSABS 0.1 11/24/2017 1626   BASOSABS 0.1 07/20/2019 0825   BASOSABS 0.0 11/24/2017 1626   CMP Latest Ref Rng & Units 07/20/2019 07/13/2019 06/22/2019  Glucose 70 - 99 mg/dL 112(H) 124(H) 104(H)  BUN 8 - 23 mg/dL _0 Creatinine 0.44 - 1.00 mg/dL 1.07(H) 1.09(H) 1.03(H)  Sodium 135 - 145 mmol/L 135 134(L) 135  Potassium 3.5 - 5.1 mmol/L 3.4(L) 3.2(L) 3.8  Chloride 98 - 111 mmol/L 105 104 104  CO2 22 - 32 mmol/L 22 19(L) 15(L)  Calcium 8.9 - 10.3 mg/dL  9.1 8.9 9.0  Total Protein 6.5 - 8.1 g/dL 6.8 7.1 7.1  Total Bilirubin 0.3 - 1.2 mg/dL 0.6 1.3(H) 1.0  Alkaline Phos 38 - 126 U/L 132(H) 132(H) 128(H)  AST 15 - 41 U/L 92(H) 98(H) 77(H)  ALT 0 - 44 U/L 44 42 34       DIAGNOSTIC IMAGING:  I have independently reviewed the scans and discussed with the patient.    ASSESSMENT & PLAN:   Malignant neoplasm of upper-outer quadrant of right breast in female, estrogen receptor negative ([place]) 1.  Metastatic right breast cancer to the skin, ER/PR negative, HER-2 positive: -7 cycles of Kadcyla from 02/15/2019 through 06/22/2019. -PET scan on 04/15/2019 shows persistent right breast hypermetabolism.  No evidence of metastatic disease. -We held her last week's treatment secondary to bilirubin elevated at 1.3. -She had chronic elevation of AST.  She also takes over-the-counter herbal supplements. - She denies any symptoms of PND or orthopnea.  No upper quadrant pain on the right side. -We reviewed her blood work.  Bilirubin improved.  AST is still elevated but better. - She will proceed with Kadcyla today at 3 mg/kg dose. -We will see her back in 3 weeks for follow-up.  2.  High risk drug monitoring: -Echocardiogram on 06/18/2019 with EF more than 65%, hyperdynamic.  Total time spent is 25 minutes with more than  50% of the time spent face-to-face discussing  treatment plan, counseling and coordination of care.    Orders placed this encounter:  Orders Placed This Encounter  Procedures   CBC with Differential/Platelet   Comprehensive metabolic panel      Derek Jack, MD [place] [phone]

## 2019-07-20 NOTE — Patient Instructions (Signed)
[facility] at [facility] Discharge Instructions  You were seen today by Dr. Katragadda. He went over your recent lab results. He will see you back in 3 weeks for labs and follow up.   Thank you for choosing [facility] at [facility] to provide your oncology and hematology care.  To afford each patient quality time with our provider, please arrive at least 15 minutes before your scheduled appointment time.   If you have a lab appointment with the Cancer Center please come in thru the  Main Entrance and check in at the main information desk  You need to re-schedule your appointment should you arrive 10 or more minutes late.  We strive to give you quality time with our providers, and arriving late affects you and other patients whose appointments are after yours.  Also, if you no show three or more times for appointments you may be dismissed from the clinic at the providers discretion.     Again, thank you for choosing  Cancer Center.  Our hope is that these requests will decrease the amount of time that you wait before being seen by our physicians.       _____________________________________________________________  Should you have questions after your visit to  Cancer Center, please contact our office at [phone] between the hours of 8:00 a.m. and 4:30 p.m.  Voicemails left after 4:00 p.m. will not be returned until the following business day.  For prescription refill requests, have your pharmacy contact our office and allow 72 hours.    Cancer Center Support Programs:   > Cancer Support Group  2nd Tuesday of the month 1pm-2pm, Journey Room    

## 2019-07-20 NOTE — Patient Instructions (Signed)
[facility] Discharge Instructions for Patients Receiving Chemotherapy  Today you received the following chemotherapy agents   To help prevent nausea and vomiting after your treatment, we encourage you to take your nausea medication   If you develop nausea and vomiting that is not controlled by your nausea medication, call the clinic.   BELOW ARE SYMPTOMS THAT SHOULD BE REPORTED IMMEDIATELY:  *FEVER GREATER THAN 100.5 F  *CHILLS WITH OR WITHOUT FEVER  NAUSEA AND VOMITING THAT IS NOT CONTROLLED WITH YOUR NAUSEA MEDICATION  *UNUSUAL SHORTNESS OF BREATH  *UNUSUAL BRUISING OR BLEEDING  TENDERNESS IN MOUTH AND THROAT WITH OR WITHOUT PRESENCE OF ULCERS  *URINARY PROBLEMS  *BOWEL PROBLEMS  UNUSUAL RASH Items with * indicate a potential emergency and should be followed up as soon as possible.  Feel free to call the clinic should you have any questions or concerns. The clinic phone number is [phone].  Please show the CHEMO ALERT CARD at check-in to the Emergency Department and triage nurse.   

## 2019-07-20 NOTE — Assessment & Plan Note (Signed)
1.  Metastatic right breast cancer to the skin, ER/PR negative, HER-2 positive: -7 cycles of Kadcyla from 02/15/2019 through 06/22/2019. -PET scan on 04/15/2019 shows persistent right breast hypermetabolism.  No evidence of metastatic disease. -We held her last week's treatment secondary to bilirubin elevated at 1.3. -She had chronic elevation of AST.  She also takes over-the-counter herbal supplements. - She denies any symptoms of PND or orthopnea.  No upper quadrant pain on the right side. -We reviewed her blood work.  Bilirubin improved.  AST is still elevated but better. - She will proceed with Kadcyla today at 3 mg/kg dose. -We will see her back in 3 weeks for follow-up.  2.  High risk drug monitoring: -Echocardiogram on 06/18/2019 with EF more than 65%, hyperdynamic.

## 2019-07-20 NOTE — Progress Notes (Signed)
Labs reviewed with MD today. Will proceed with treatment per MD.   Treatment given per orders. Patient tolerated it well without problems. Vitals stable and discharged home from clinic ambulatory. Follow up as scheduled.  

## 2019-07-30 DIAGNOSIS — R69 Illness, unspecified: Secondary | ICD-10-CM | POA: Diagnosis not present

## 2019-08-03 ENCOUNTER — Ambulatory Visit (HOSPITAL_COMMUNITY): Payer: Medicare HMO | Admitting: Hematology

## 2019-08-03 ENCOUNTER — Ambulatory Visit (HOSPITAL_COMMUNITY): Payer: Medicare HMO

## 2019-08-03 ENCOUNTER — Other Ambulatory Visit (HOSPITAL_COMMUNITY): Payer: Medicare HMO

## 2019-08-10 ENCOUNTER — Encounter (HOSPITAL_COMMUNITY): Payer: Self-pay

## 2019-08-10 ENCOUNTER — Inpatient Hospital Stay (HOSPITAL_COMMUNITY): Payer: Medicare HMO

## 2019-08-10 ENCOUNTER — Inpatient Hospital Stay (HOSPITAL_BASED_OUTPATIENT_CLINIC_OR_DEPARTMENT_OTHER): Payer: Medicare HMO | Admitting: Hematology

## 2019-08-10 ENCOUNTER — Other Ambulatory Visit: Payer: Self-pay

## 2019-08-10 ENCOUNTER — Inpatient Hospital Stay (HOSPITAL_COMMUNITY): Payer: Medicare HMO | Attending: Hematology

## 2019-08-10 ENCOUNTER — Encounter (HOSPITAL_COMMUNITY): Payer: Self-pay | Admitting: Hematology

## 2019-08-10 VITALS — BP 142/83 | HR 107 | Temp 97.0°F | Resp 18 | Wt 168.6 lb

## 2019-08-10 VITALS — BP 137/61 | HR 99 | Temp 97.1°F | Resp 18

## 2019-08-10 DIAGNOSIS — Z5112 Encounter for antineoplastic immunotherapy: Secondary | ICD-10-CM | POA: Insufficient documentation

## 2019-08-10 DIAGNOSIS — R200 Anesthesia of skin: Secondary | ICD-10-CM | POA: Diagnosis not present

## 2019-08-10 DIAGNOSIS — K219 Gastro-esophageal reflux disease without esophagitis: Secondary | ICD-10-CM | POA: Diagnosis not present

## 2019-08-10 DIAGNOSIS — Z171 Estrogen receptor negative status [ER-]: Secondary | ICD-10-CM | POA: Insufficient documentation

## 2019-08-10 DIAGNOSIS — C50411 Malignant neoplasm of upper-outer quadrant of right female breast: Secondary | ICD-10-CM

## 2019-08-10 DIAGNOSIS — I10 Essential (primary) hypertension: Secondary | ICD-10-CM | POA: Diagnosis not present

## 2019-08-10 DIAGNOSIS — E079 Disorder of thyroid, unspecified: Secondary | ICD-10-CM | POA: Insufficient documentation

## 2019-08-10 DIAGNOSIS — R5383 Other fatigue: Secondary | ICD-10-CM | POA: Diagnosis not present

## 2019-08-10 DIAGNOSIS — E785 Hyperlipidemia, unspecified: Secondary | ICD-10-CM | POA: Insufficient documentation

## 2019-08-10 DIAGNOSIS — Z79899 Other long term (current) drug therapy: Secondary | ICD-10-CM | POA: Insufficient documentation

## 2019-08-10 LAB — CBC WITH DIFFERENTIAL/PLATELET
Abs Immature Granulocytes: 0.02 10*3/uL (ref 0.00–0.07)
Basophils Absolute: 0.1 10*3/uL (ref 0.0–0.1)
Basophils Relative: 1 %
Eosinophils Absolute: 0.7 10*3/uL — ABNORMAL HIGH (ref 0.0–0.5)
Eosinophils Relative: 7 %
HCT: 35.1 % — ABNORMAL LOW (ref 36.0–46.0)
Hemoglobin: 11.3 g/dL — ABNORMAL LOW (ref 12.0–15.0)
Immature Granulocytes: 0 %
Lymphocytes Relative: 35 %
Lymphs Abs: 3.2 10*3/uL (ref 0.7–4.0)
MCH: 28.4 pg (ref 26.0–34.0)
MCHC: 32.2 g/dL (ref 30.0–36.0)
MCV: 88.2 fL (ref 80.0–100.0)
Monocytes Absolute: 1 10*3/uL (ref 0.1–1.0)
Monocytes Relative: 11 %
Neutro Abs: 4.1 10*3/uL (ref 1.7–7.7)
Neutrophils Relative %: 46 %
Platelets: 286 10*3/uL (ref 150–400)
RBC: 3.98 MIL/uL (ref 3.87–5.11)
RDW: 18.5 % — ABNORMAL HIGH (ref 11.5–15.5)
WBC: 9 10*3/uL (ref 4.0–10.5)
nRBC: 0 % (ref 0.0–0.2)

## 2019-08-10 LAB — COMPREHENSIVE METABOLIC PANEL
ALT: 39 U/L (ref 0–44)
AST: 91 U/L — ABNORMAL HIGH (ref 15–41)
Albumin: 2.7 g/dL — ABNORMAL LOW (ref 3.5–5.0)
Alkaline Phosphatase: 124 U/L (ref 38–126)
Anion gap: 10 (ref 5–15)
BUN: 15 mg/dL (ref 8–23)
CO2: 20 mmol/L — ABNORMAL LOW (ref 22–32)
Calcium: 8.8 mg/dL — ABNORMAL LOW (ref 8.9–10.3)
Chloride: 105 mmol/L (ref 98–111)
Creatinine, Ser: 1.04 mg/dL — ABNORMAL HIGH (ref 0.44–1.00)
GFR calc Af Amer: 56 mL/min — ABNORMAL LOW (ref >60)
GFR calc non Af Amer: 48 mL/min — ABNORMAL LOW (ref >60)
Glucose, Bld: 115 mg/dL — ABNORMAL HIGH (ref 70–99)
Potassium: 3.3 mmol/L — ABNORMAL LOW (ref 3.5–5.1)
Sodium: 135 mmol/L (ref 135–145)
Total Bilirubin: 0.7 mg/dL (ref 0.3–1.2)
Total Protein: 7.1 g/dL (ref 6.5–8.1)

## 2019-08-10 MED ORDER — ACETAMINOPHEN 325 MG PO TABS
650.0000 mg | ORAL_TABLET | Freq: Once | ORAL | Status: AC
  Administered 2019-08-10: 650 mg via ORAL
  Filled 2019-08-10: qty 2

## 2019-08-10 MED ORDER — SODIUM CHLORIDE 0.9 % IV SOLN
3.0000 mg/kg | Freq: Once | INTRAVENOUS | Status: AC
  Administered 2019-08-10: 10:00:00 240 mg via INTRAVENOUS
  Filled 2019-08-10: qty 5

## 2019-08-10 MED ORDER — SODIUM CHLORIDE 0.9% FLUSH
10.0000 mL | INTRAVENOUS | Status: DC | PRN
  Administered 2019-08-10: 10 mL
  Filled 2019-08-10: qty 10

## 2019-08-10 MED ORDER — SODIUM CHLORIDE 0.9 % IV SOLN
Freq: Once | INTRAVENOUS | Status: AC
  Administered 2019-08-10: 09:00:00 via INTRAVENOUS

## 2019-08-10 MED ORDER — HEPARIN SOD (PORK) LOCK FLUSH 100 UNIT/ML IV SOLN
500.0000 [IU] | Freq: Once | INTRAVENOUS | Status: AC | PRN
  Administered 2019-08-10: 500 [IU]

## 2019-08-10 MED ORDER — DIPHENHYDRAMINE HCL 25 MG PO CAPS
50.0000 mg | ORAL_CAPSULE | Freq: Once | ORAL | Status: AC
  Administered 2019-08-10: 50 mg via ORAL
  Filled 2019-08-10: qty 2

## 2019-08-10 NOTE — Patient Instructions (Signed)
[facility] Discharge Instructions for Patients Receiving Chemotherapy   Beginning January 23rd 2017 lab work for the Cancer Center will be done in the  Main lab at  on 1st floor. If you have a lab appointment with the Cancer Center please come in thru the  Main Entrance and check in at the main information desk   Today you received the following chemotherapy agents Kadcyla. Follow-up as scheduled. Call clinic for any questions or concerns  To help prevent nausea and vomiting after your treatment, we encourage you to take your nausea medication   If you develop nausea and vomiting, or diarrhea that is not controlled by your medication, call the clinic.  The clinic phone number is [phone]. Office hours are Monday-Friday 8:30am-5:00pm.  BELOW ARE SYMPTOMS THAT SHOULD BE REPORTED IMMEDIATELY:  *FEVER GREATER THAN 101.0 F  *CHILLS WITH OR WITHOUT FEVER  NAUSEA AND VOMITING THAT IS NOT CONTROLLED WITH YOUR NAUSEA MEDICATION  *UNUSUAL SHORTNESS OF BREATH  *UNUSUAL BRUISING OR BLEEDING  TENDERNESS IN MOUTH AND THROAT WITH OR WITHOUT PRESENCE OF ULCERS  *URINARY PROBLEMS  *BOWEL PROBLEMS  UNUSUAL RASH Items with * indicate a potential emergency and should be followed up as soon as possible. If you have an emergency after office hours please contact your primary care physician or go to the nearest emergency department.  Please call the clinic during office hours if you have any questions or concerns.   You may also contact the Patient Navigator at [phone] should you have any questions or need assistance in obtaining follow up care.      Resources For Cancer Patients and their Caregivers ? American Cancer Society: Can assist with transportation, wigs, general needs, runs Look Good Feel Better.        1-888-227-6333 ? Cancer Care: Provides financial assistance, online support groups, medication/co-pay assistance.  1-800-813-HOPE  (4673) ? [facility] Assists [place] cancer patients and their families through emotional , educational and financial support.  336-427-4357 ? [place] DSS Where to apply for food stamps, Medicaid and utility assistance. 336-342-1394 ? [facility]: Transportation to medical appointments. 336-347-2287 ? Social Security Administration: May apply for disability if have a Stage IV cancer. 336-342-7796 1-800-772-1213 ? [place] Aging, Disability and Transit Services: Assists with nutrition, care and transit needs. 336-349-2343         

## 2019-08-10 NOTE — Progress Notes (Signed)
[place], [place] [address]   CLINIC:  Medical Oncology/Hematology  PCP:  [name], MD [address] [place] [address] [phone]   REASON FOR VISIT: Follow-up for right breast cancer, ER-/PR-/HER2+  CURRENT THERAPY: Kadcyla every 3 weeks   BRIEF ONCOLOGIC HISTORY:  Oncology History  Malignant neoplasm of upper-outer quadrant of right breast in female, estrogen receptor negative ([place])  07/03/2018 Initial Diagnosis   Palpable right breast mass for several months with skin discoloration, clinically skin trabecular thickening with nipple retraction measuring 7.2 x 4.3 x 4.2 cm, additional mass 4 o'clock position 2.6 cm, right axillary tail 1.2 cm right axillary lymph node 1.8 cm left breast irregular mass 1 cm, adjacent mass 0.9 cm together measuring 1.9 cm, no lymphadenopathy   07/03/2018 Pathology Results   Right breast biopsy: IDC grade 2, ER 0%, PR 0%, HER-2 positive, Ki-67 40%, lymph node biopsy negative, left breast biopsy complex sclerosing lesion    07/15/2018 Cancer Staging   Staging form: Breast, AJCC 8th Edition - Clinical: Stage IIB (cT3, cN0, cM0, G2, ER-, PR-, HER2+) - Signed by [name], MD on 07/15/2018   07/31/2018 - 10/30/2018 Chemotherapy   The patient had trastuzumab (HERCEPTIN) 336 mg in sodium chloride 0.9 % 250 mL chemo infusion, 4 mg/kg = 336 mg, Intravenous,  Once, 4 of 4 cycles Administration: 336 mg (07/31/2018), 168 mg (08/07/2018), 168 mg (08/28/2018), 168 mg (08/14/2018), 168 mg (08/21/2018), 168 mg (09/04/2018), 168 mg (09/11/2018), 168 mg (09/18/2018), 168 mg (10/02/2018), 168 mg (10/09/2018), 168 mg (10/16/2018), 168 mg (10/23/2018), 168 mg (10/30/2018) PACLitaxel (TAXOL) 78 mg in sodium chloride 0.9 % 250 mL chemo infusion (</= 33m/m2), 40 mg/m2 = 78 mg (50 % of original dose 80 mg/m2), Intravenous,  Once, 4 of 4 cycles Dose modification: 40 mg/m2 (50 % of original dose 80 mg/m2, Cycle 1, Reason: Patient  Age), 53.3333 mg/m2 (66.7 % of original dose 80 mg/m2, Cycle 1, Reason: Provider Judgment), 45 mg/m2 (original dose 80 mg/m2, Cycle 3, Reason: Provider Judgment) Administration: 78 mg (07/31/2018), 102 mg (08/07/2018), 102 mg (08/28/2018), 102 mg (08/14/2018), 102 mg (08/21/2018), 102 mg (09/04/2018), 102 mg (09/18/2018), 84 mg (10/02/2018), 84 mg (10/09/2018), 84 mg (10/16/2018), 84 mg (10/23/2018), 84 mg (10/30/2018)  for chemotherapy treatment.    11/13/2018 - 02/14/2019 Chemotherapy   The patient had trastuzumab (HERCEPTIN) 504 mg in sodium chloride 0.9 % 250 mL chemo infusion, 6 mg/kg = 504 mg (100 % of original dose 6 mg/kg), Intravenous,  Once, 4 of 5 cycles Dose modification: 6 mg/kg (original dose 6 mg/kg, Cycle 1, Reason: Other (see comments), Comment: pt receving it ) Administration: 504 mg (11/13/2018), 504 mg (12/04/2018), 504 mg (01/04/2019), 504 mg (01/25/2019)  for chemotherapy treatment.    02/15/2019 -  Chemotherapy   The patient had ado-trastuzumab emtansine (KADCYLA) 240 mg in sodium chloride 0.9 % 250 mL chemo infusion, 3 mg/kg = 240 mg (100 % of original dose 3 mg/kg), Intravenous, Once, 9 of 12 cycles Dose modification: 3 mg/kg (original dose 3 mg/kg, Cycle 1, Reason: Provider Judgment), 3 mg/kg (original dose 3 mg/kg, Cycle 2, Reason: Provider Judgment) Administration: 240 mg (02/15/2019), 240 mg (03/08/2019), 240 mg (05/10/2019), 240 mg (03/29/2019), 240 mg (04/19/2019), 240 mg (06/01/2019), 240 mg (06/22/2019), 240 mg (07/20/2019), 240 mg (08/10/2019)  for chemotherapy treatment.       CANCER STAGING: Cancer Staging Malignant neoplasm of upper-outer quadrant of right breast in female, estrogen receptor negative ([place]  Staging form: Breast, AJCC 8th Edition - Clinical: Stage IIB (cT3, cN0, cM0, G2, ER-, PR-, HER2+) - Signed by [name], MD on 07/15/2018    INTERVAL HISTORY:  Ms. Moulin 85 y.o. female seen for follow-up of metastatic breast cancer to the chest wall.  She is continuing to  tolerate Kadcyla very well.  Appetite is 100%.  Energy levels are 50%.  No pain is reported.  Mild fatigue has been stable.  Numbness in fingers is occasional.  Denies any fevers or chills.  No major weight changes reported.    REVIEW OF SYSTEMS:  Review of Systems  Constitutional: Positive for fatigue.  Neurological: Positive for numbness.  All other systems reviewed and are negative.    PAST MEDICAL/SURGICAL HISTORY:  Past Medical History:  Diagnosis Date  . Cancer ([place]) 06/2018   right breast cancer  . GERD (gastroesophageal reflux disease)    occasional   . Hyperlipidemia   . Hypertension    clearance with note Dr Nevada Crane on chart  . Pneumonia    2 years ago/ states occ cough with sinus drainage- no fever  . Thyroid nodule    with biopsy- states following medically   Past Surgical History:  Procedure Laterality Date  . CATARACT EXTRACTION W/PHACO  11/30/2012   Procedure: CATARACT EXTRACTION PHACO AND INTRAOCULAR LENS PLACEMENT (IOC);  Surgeon: Williams Che, MD;  Location: [facility];  Service: Ophthalmology;  Laterality: Left;  CDE:11.49  . CATARACT EXTRACTION W/PHACO Right 03/15/2013   Procedure: CATARACT EXTRACTION PHACO AND INTRAOCULAR LENS PLACEMENT (IOC);  Surgeon: Williams Che, MD;  Location: [facility];  Service: Ophthalmology;  Laterality: Right;  CDE 16.15  . COLONOSCOPY    . EXCISION OF SKIN TAG Right 06/17/2016   Procedure: SHAVE OF SKIN TAG RIGHT BUTTOCK;  Surgeon: Aviva Signs, MD;  Location: [facility];  Service: General;  Laterality: Right;  . JOINT REPLACEMENT     left knee  . MASS EXCISION Left 06/17/2016   Procedure: EXCISION SKIN MALIGNANT LESION LEFT BUTTOCK;  Surgeon: Aviva Signs, MD;  Location: [facility];  Service: General;  Laterality: Left;  . PORTACATH PLACEMENT Right 07/28/2018   Procedure: INSERTION PORT-A-CATH WITH ULTRASOUND;  Surgeon: Rolm Bookbinder, MD;  Location: [facility];  Service: General;  Laterality: Right;  . PUNCH BIOPSY OF  SKIN Right 07/28/2018   Procedure: PUNCH BIOPSY OF SKIN RIGHT BREAST;  Surgeon: Rolm Bookbinder, MD;  Location: [facility];  Service: General;  Laterality: Right;  . TOTAL KNEE ARTHROPLASTY  12/16/2011   Procedure: TOTAL KNEE ARTHROPLASTY;  Surgeon: Gearlean Alf, MD;  Location: [facility];  Service: Orthopedics;  Laterality: Right;  . TUBAL LIGATION       SOCIAL HISTORY:  Social History   Socioeconomic History  . Marital status: Married    Spouse name: Not on file  . Number of children: 2  . Years of education: some business school after HS  . Highest education level: Not on file  Occupational History  . Occupation: Cabin crew  . Occupation: Network engineer   Social Needs  . Financial resource strain: Not hard at all  . Food insecurity    Worry: Never true    Inability: Never true  . Transportation needs    Medical: No    Non-medical: No  Tobacco Use  . Smoking status: Never Smoker  . Smokeless tobacco: Never Used  Substance and Sexual Activity  . Alcohol use: No    Frequency: Never  . Drug use:  No  . Sexual activity: Yes    Birth control/protection: None  Lifestyle  . Physical activity    Days per week: 0 days    Minutes per session: 0 min  . Stress: Not at all  Relationships  . Social connections    Talks on phone: More than three times a week    Gets together: More than three times a week    Attends religious service: More than 4 times per year    Active member of club or organization: Yes    Attends meetings of clubs or organizations: Never    Relationship status: Married  . Intimate partner violence    Fear of current or ex partner: No    Emotionally abused: No    Physically abused: No    Forced sexual activity: No  Other Topics Concern  . Not on file  Social History Narrative   Lives at home with her husband.   4 cups caffeine per day.   Right-handed.    FAMILY HISTORY:  Family History  Problem Relation Age of Onset  . Heart attack Mother    . Bronchitis Father   . Diabetes Sister   . Leukemia Brother   . Lung disease Brother   . Lung disease Sister     CURRENT MEDICATIONS:  Outpatient Encounter Medications as of 08/10/2019  Medication Sig  . acetaminophen (TYLENOL) 325 MG tablet Take 650 mg by mouth every 6 (six) hours as needed.  Marland Kitchen aspirin 325 MG tablet Take 162.5 mg by mouth daily.   . cetirizine (ZYRTEC) 10 MG tablet Take 10 mg by mouth daily.  . hydrocortisone 1 % ointment Apply 1 application topically 2 (two) times daily.  Marland Kitchen lidocaine-prilocaine (EMLA) cream Apply to port site one hour prior to appointment and cover with plastic wrap.  . Misc. Devices MISC Please provide lymphedema sleeve for right arm (Patient not taking: Reported on 07/13/2019)  . Multiple Vitamin (MULTIVITAMIN WITH MINERALS) TABS tablet Take 1 tablet by mouth daily.  . Omega-3 Fatty Acids (FISH OIL PO) Take 1 capsule by mouth daily.   Glory Rosebush ULTRA test strip USE 1 STRIP TO CHECK GLUCOSE 2 TO 3 TIMES DAILY  . PACLitaxel (TAXOL IV) Inject into the vein once a week.  . Trastuzumab (HERCEPTIN IV) Inject into the vein once a week.    No facility-administered encounter medications on file as of 08/10/2019.     ALLERGIES:  Allergies  Allergen Reactions  . Penicillins Rash    Has patient had a PCN reaction causing immediate rash, facial/tongue/throat swelling, SOB or lightheadedness with hypotension: No Has patient had a PCN reaction causing severe rash involving mucus membranes or skin necrosis: No Has patient had a PCN reaction that required hospitalization: No Has patient had a PCN reaction occurring within the last 10 years: No If all of the above answers are "NO", then may proceed with Cephalosporin use.      PHYSICAL EXAM:  ECOG Performance status: 1  Vitals:   08/10/19 0804  BP: (!) 142/83  Pulse: (!) 107  Resp: 18  Temp: (!) 97 F (36.1 C)  SpO2: 100%   Filed Weights   08/10/19 0804  Weight: 168 lb 9.6 oz (76.5 kg)     Physical Exam Constitutional:      Appearance: Normal appearance. She is normal weight.  Cardiovascular:     Rate and Rhythm: Normal rate and regular rhythm.     Heart sounds: Normal heart sounds.  Pulmonary:  Effort: Pulmonary effort is normal.     Breath sounds: Normal breath sounds.  Abdominal:     General: Bowel sounds are normal.     Palpations: Abdomen is soft.  Musculoskeletal: Normal range of motion.  Skin:    General: Skin is warm and dry.  Neurological:     Mental Status: She is alert and oriented to person, place, and time. Mental status is at baseline.  Psychiatric:        Mood and Affect: Mood normal.        Behavior: Behavior normal.        Thought Content: Thought content normal.        Judgment: Judgment normal.    Right breast mass is stable.  No erythema over the breast mass or posterior chest wall.  LABORATORY DATA:  I have reviewed the labs as listed.  CBC    Component Value Date/Time   WBC 9.0 08/10/2019 0754   RBC 3.98 08/10/2019 0754   HGB 11.3 (L) 08/10/2019 0754   HGB 12.7 11/24/2017 1626   HCT 35.1 (L) 08/10/2019 0754   HCT 36.6 11/24/2017 1626   PLT 286 08/10/2019 0754   PLT 420 (H) 11/24/2017 1626   MCV 88.2 08/10/2019 0754   MCV 88 11/24/2017 1626   MCH 28.4 08/10/2019 0754   MCHC 32.2 08/10/2019 0754   RDW 18.5 (H) 08/10/2019 0754   RDW 15.4 11/24/2017 1626   LYMPHSABS 3.2 08/10/2019 0754   LYMPHSABS 2.5 11/24/2017 1626   MONOABS 1.0 08/10/2019 0754   EOSABS 0.7 (H) 08/10/2019 0754   EOSABS 0.1 11/24/2017 1626   BASOSABS 0.1 08/10/2019 0754   BASOSABS 0.0 11/24/2017 1626   CMP Latest Ref Rng & Units 08/10/2019 07/20/2019 07/13/2019  Glucose 70 - 99 mg/dL 115(H) 112(H) 124(H)  BUN 8 - 23 mg/dL 15 15 16   Creatinine 0.44 - 1.00 mg/dL 1.04(H) 1.07(H) 1.09(H)  Sodium 135 - 145 mmol/L 135 135 134(L)  Potassium 3.5 - 5.1 mmol/L 3.3(L) 3.4(L) 3.2(L)  Chloride 98 - 111 mmol/L 105 105 104  CO2 22 - 32 mmol/L 20(L) 22 19(L)  Calcium  8.9 - 10.3 mg/dL 8.8(L) 9.1 8.9  Total Protein 6.5 - 8.1 g/dL 7.1 6.8 7.1  Total Bilirubin 0.3 - 1.2 mg/dL 0.7 0.6 1.3(H)  Alkaline Phos 38 - 126 U/L 124 132(H) 132(H)  AST 15 - 41 U/L 91(H) 92(H) 98(H)  ALT 0 - 44 U/L 39 44 42       DIAGNOSTIC IMAGING:  I have independently reviewed the scans and discussed with the patient.    ASSESSMENT & PLAN:   Malignant neoplasm of upper-outer quadrant of right breast in female, estrogen receptor negative ([place]) 1.  Metastatic right breast cancer to the skin of posterior chest wall, ER/PR negative, HER-2 positive: - 8 cycles of Kadcyla from 02/15/2019 through 07/20/2019. -PET scan on 04/15/2019 showed persistent right breast hypermetabolism, no evidence of metastatic disease. - She denies any symptoms of PND or orthopnea. - Physical examination today shows right breast mass is stable.  No erythema on the right breast or right axilla or right posterior chest wall. - We have reviewed her labs.  Total bilirubin is less than 1.  She will proceed with her next treatment today. -She will come back in 3 weeks for follow-up for her next treatment. -I plan to see her back in 6 weeks with PET CT scan and echocardiogram.  2.  High risk drug monitoring: -Echo on 06/18/2019 shows EF more than  65%, hyperdynamic.  Total time spent is 25 minutes with more than 50% of the time spent face-to-face discussing  treatment plan, counseling and coordination of care.    Orders placed this encounter:  Orders Placed This Encounter  Procedures  . NM PET Image Restag (PS) Skull Base To Thigh  . ECHOCARDIOGRAM COMPLETE      Derek Jack, [place] [phone]

## 2019-08-10 NOTE — Patient Instructions (Addendum)
[place] at [facility] Discharge Instructions  You were seen today by Dr. [name]. He went over your recent lab results. He will schedule you for an ECHO and PET scan in 6 weeks. He will see you back in 3 weeks for labs, treatment and follow up.   Thank you for choosing [place] at [facility] to provide your oncology and hematology care.  To afford each patient quality time with our provider, please arrive at least 15 minutes before your scheduled appointment time.   If you have a lab appointment with the [facility] please come in thru the  Main Entrance and check in at the main information desk  You need to re-schedule your appointment should you arrive 10 or more minutes late.  We strive to give you quality time with our providers, and arriving late affects you and other patients whose appointments are after yours.  Also, if you no show three or more times for appointments you may be dismissed from the clinic at the providers discretion.     Again, thank you for choosing [facility].  Our hope is that these requests will decrease the amount of time that you wait before being seen by our physicians.       _____________________________________________________________  Should you have questions after your visit to [facility], please contact our office at [phone] between the hours of 8:00 a.m. and 4:30 p.m.  Voicemails left after 4:00 p.m. will not be returned until the following business day.  For prescription refill requests, have your pharmacy contact our office and allow 72 hours.    Cancer Center Support Programs:   > Cancer Support Group  2nd Tuesday of the month 1pm-2pm, Journey Room

## 2019-08-10 NOTE — Assessment & Plan Note (Addendum)
1.  Metastatic right breast cancer to the skin of posterior chest wall, ER/PR negative, HER-2 positive: - 8 cycles of Kadcyla from 02/15/2019 through 07/20/2019. -PET scan on 04/15/2019 showed persistent right breast hypermetabolism, no evidence of metastatic disease. - She denies any symptoms of PND or orthopnea. - Physical examination today shows right breast mass is stable.  No erythema on the right breast or right axilla or right posterior chest wall. - We have reviewed her labs.  Total bilirubin is less than 1.  She will proceed with her next treatment today. -She will come back in 3 weeks for follow-up for her next treatment. -I plan to see her back in 6 weeks with PET CT scan and echocardiogram.  2.  High risk drug monitoring: -Echo on 06/18/2019 shows EF more than 65%, hyperdynamic.

## 2019-08-10 NOTE — Progress Notes (Signed)
0855 Lab results and vital signs reviewed with and pt seen by Dr. [name] and pt approved for Kadcyla infusion today per MD                                                                            [name] tolerated Kadcyla infusion well without complaints or incident. VSS upon discharge. Pt discharged self ambulatory with assistance from her husband in satisfactory condition

## 2019-08-10 NOTE — Patient Instructions (Signed)
[facility] at [facility] Discharge Instructions  Labs drawn from portacath today   Thank you for choosing [facility] at [facility] to provide your oncology and hematology care.  To afford each patient quality time with our provider, please arrive at least 15 minutes before your scheduled appointment time.   If you have a lab appointment with the Cancer Center please come in thru the Main Entrance and check in at the main information desk.  You need to re-schedule your appointment should you arrive 10 or more minutes late.  We strive to give you quality time with our providers, and arriving late affects you and other patients whose appointments are after yours.  Also, if you no show three or more times for appointments you may be dismissed from the clinic at the providers discretion.     Again, thank you for choosing [facility].  Our hope is that these requests will decrease the amount of time that you wait before being seen by our physicians.       _____________________________________________________________  Should you have questions after your visit to [facility], please contact our office at [phone] between the hours of 8:00 a.m. and 4:30 p.m.  Voicemails left after 4:00 p.m. will not be returned until the following business day.  For prescription refill requests, have your pharmacy contact our office and allow 72 hours.    Due to Covid, you will need to wear a mask upon entering the hospital. If you do not have a mask, a mask will be given to you at the Main Entrance upon arrival. For doctor visits, patients may have 1 support person with them. For treatment visits, patients can not have anyone with them due to social distancing guidelines and our immunocompromised population.     

## 2019-08-17 DIAGNOSIS — R69 Illness, unspecified: Secondary | ICD-10-CM | POA: Diagnosis not present

## 2019-08-26 ENCOUNTER — Other Ambulatory Visit (HOSPITAL_COMMUNITY): Payer: Self-pay

## 2019-08-26 DIAGNOSIS — Z171 Estrogen receptor negative status [ER-]: Secondary | ICD-10-CM

## 2019-08-26 DIAGNOSIS — C50411 Malignant neoplasm of upper-outer quadrant of right female breast: Secondary | ICD-10-CM

## 2019-08-31 ENCOUNTER — Ambulatory Visit (HOSPITAL_COMMUNITY): Payer: Medicare HMO | Admitting: Nurse Practitioner

## 2019-08-31 ENCOUNTER — Ambulatory Visit (HOSPITAL_COMMUNITY): Payer: Medicare HMO

## 2019-08-31 ENCOUNTER — Other Ambulatory Visit (HOSPITAL_COMMUNITY): Payer: Medicare HMO

## 2019-09-01 ENCOUNTER — Other Ambulatory Visit: Payer: Self-pay

## 2019-09-01 ENCOUNTER — Inpatient Hospital Stay (HOSPITAL_COMMUNITY): Payer: Medicare HMO | Attending: Hematology

## 2019-09-01 ENCOUNTER — Inpatient Hospital Stay (HOSPITAL_BASED_OUTPATIENT_CLINIC_OR_DEPARTMENT_OTHER): Payer: Medicare HMO | Admitting: Nurse Practitioner

## 2019-09-01 ENCOUNTER — Inpatient Hospital Stay (HOSPITAL_COMMUNITY): Payer: Medicare HMO

## 2019-09-01 VITALS — BP 142/72 | HR 97 | Temp 97.3°F | Resp 18 | Wt 166.2 lb

## 2019-09-01 DIAGNOSIS — E785 Hyperlipidemia, unspecified: Secondary | ICD-10-CM | POA: Diagnosis not present

## 2019-09-01 DIAGNOSIS — R918 Other nonspecific abnormal finding of lung field: Secondary | ICD-10-CM | POA: Diagnosis not present

## 2019-09-01 DIAGNOSIS — Z9221 Personal history of antineoplastic chemotherapy: Secondary | ICD-10-CM | POA: Diagnosis not present

## 2019-09-01 DIAGNOSIS — Z171 Estrogen receptor negative status [ER-]: Secondary | ICD-10-CM

## 2019-09-01 DIAGNOSIS — Z806 Family history of leukemia: Secondary | ICD-10-CM | POA: Insufficient documentation

## 2019-09-01 DIAGNOSIS — Z7982 Long term (current) use of aspirin: Secondary | ICD-10-CM | POA: Insufficient documentation

## 2019-09-01 DIAGNOSIS — C50411 Malignant neoplasm of upper-outer quadrant of right female breast: Secondary | ICD-10-CM

## 2019-09-01 DIAGNOSIS — Z5112 Encounter for antineoplastic immunotherapy: Secondary | ICD-10-CM | POA: Diagnosis present

## 2019-09-01 DIAGNOSIS — Z79899 Other long term (current) drug therapy: Secondary | ICD-10-CM | POA: Insufficient documentation

## 2019-09-01 DIAGNOSIS — I10 Essential (primary) hypertension: Secondary | ICD-10-CM | POA: Diagnosis not present

## 2019-09-01 DIAGNOSIS — Z833 Family history of diabetes mellitus: Secondary | ICD-10-CM | POA: Diagnosis not present

## 2019-09-01 DIAGNOSIS — C792 Secondary malignant neoplasm of skin: Secondary | ICD-10-CM | POA: Diagnosis not present

## 2019-09-01 LAB — COMPREHENSIVE METABOLIC PANEL
ALT: 35 U/L (ref 0–44)
AST: 77 U/L — ABNORMAL HIGH (ref 15–41)
Albumin: 2.8 g/dL — ABNORMAL LOW (ref 3.5–5.0)
Alkaline Phosphatase: 139 U/L — ABNORMAL HIGH (ref 38–126)
Anion gap: 12 (ref 5–15)
BUN: 18 mg/dL (ref 8–23)
CO2: 19 mmol/L — ABNORMAL LOW (ref 22–32)
Calcium: 9.5 mg/dL (ref 8.9–10.3)
Chloride: 107 mmol/L (ref 98–111)
Creatinine, Ser: 1.17 mg/dL — ABNORMAL HIGH (ref 0.44–1.00)
GFR calc Af Amer: 49 mL/min — ABNORMAL LOW (ref >60)
GFR calc non Af Amer: 42 mL/min — ABNORMAL LOW (ref >60)
Glucose, Bld: 126 mg/dL — ABNORMAL HIGH (ref 70–99)
Potassium: 3.3 mmol/L — ABNORMAL LOW (ref 3.5–5.1)
Sodium: 138 mmol/L (ref 135–145)
Total Bilirubin: 1.1 mg/dL (ref 0.3–1.2)
Total Protein: 7.2 g/dL (ref 6.5–8.1)

## 2019-09-01 LAB — CBC WITH DIFFERENTIAL/PLATELET
Abs Immature Granulocytes: 0.02 10*3/uL (ref 0.00–0.07)
Basophils Absolute: 0.1 10*3/uL (ref 0.0–0.1)
Basophils Relative: 1 %
Eosinophils Absolute: 0.7 10*3/uL — ABNORMAL HIGH (ref 0.0–0.5)
Eosinophils Relative: 7 %
HCT: 34.8 % — ABNORMAL LOW (ref 36.0–46.0)
Hemoglobin: 11.1 g/dL — ABNORMAL LOW (ref 12.0–15.0)
Immature Granulocytes: 0 %
Lymphocytes Relative: 34 %
Lymphs Abs: 3.6 10*3/uL (ref 0.7–4.0)
MCH: 28.6 pg (ref 26.0–34.0)
MCHC: 31.9 g/dL (ref 30.0–36.0)
MCV: 89.7 fL (ref 80.0–100.0)
Monocytes Absolute: 1 10*3/uL (ref 0.1–1.0)
Monocytes Relative: 10 %
Neutro Abs: 5.1 10*3/uL (ref 1.7–7.7)
Neutrophils Relative %: 48 %
Platelets: 282 10*3/uL (ref 150–400)
RBC: 3.88 MIL/uL (ref 3.87–5.11)
RDW: 19.1 % — ABNORMAL HIGH (ref 11.5–15.5)
WBC: 10.5 10*3/uL (ref 4.0–10.5)
nRBC: 0 % (ref 0.0–0.2)

## 2019-09-01 MED ORDER — DIPHENHYDRAMINE HCL 25 MG PO CAPS
50.0000 mg | ORAL_CAPSULE | Freq: Once | ORAL | Status: AC
  Administered 2019-09-01: 50 mg via ORAL

## 2019-09-01 MED ORDER — POTASSIUM CHLORIDE CRYS ER 20 MEQ PO TBCR
40.0000 meq | EXTENDED_RELEASE_TABLET | Freq: Once | ORAL | Status: AC
  Administered 2019-09-01: 40 meq via ORAL
  Filled 2019-09-01: qty 2

## 2019-09-01 MED ORDER — HEPARIN SOD (PORK) LOCK FLUSH 100 UNIT/ML IV SOLN
500.0000 [IU] | Freq: Once | INTRAVENOUS | Status: AC | PRN
  Administered 2019-09-01: 500 [IU]

## 2019-09-01 MED ORDER — SODIUM CHLORIDE 0.9 % IV SOLN
INTRAVENOUS | Status: AC
  Administered 2019-09-01: 09:00:00 via INTRAVENOUS

## 2019-09-01 MED ORDER — DIPHENHYDRAMINE HCL 25 MG PO CAPS
ORAL_CAPSULE | ORAL | Status: AC
  Filled 2019-09-01: qty 2

## 2019-09-01 MED ORDER — ACETAMINOPHEN 325 MG PO TABS
650.0000 mg | ORAL_TABLET | Freq: Once | ORAL | Status: AC
  Administered 2019-09-01: 650 mg via ORAL

## 2019-09-01 MED ORDER — SODIUM CHLORIDE 0.9% FLUSH
10.0000 mL | INTRAVENOUS | Status: DC | PRN
  Administered 2019-09-01: 10 mL
  Filled 2019-09-01: qty 10

## 2019-09-01 MED ORDER — SODIUM CHLORIDE 0.9 % IV SOLN
3.0000 mg/kg | Freq: Once | INTRAVENOUS | Status: AC
  Administered 2019-09-01: 240 mg via INTRAVENOUS
  Filled 2019-09-01: qty 5

## 2019-09-01 MED ORDER — SODIUM CHLORIDE 0.9 % IV SOLN
Freq: Once | INTRAVENOUS | Status: AC
  Administered 2019-09-01: 09:00:00 via INTRAVENOUS

## 2019-09-01 MED ORDER — ACETAMINOPHEN 325 MG PO TABS
ORAL_TABLET | ORAL | Status: AC
  Filled 2019-09-01: qty 2

## 2019-09-01 NOTE — Patient Instructions (Signed)
[facility] Discharge Instructions for Patients Receiving Chemotherapy   Beginning January 23rd 2017 lab work for the Cancer Center will be done in the  Main lab at [facility] on 1st floor. If you have a lab appointment with the Cancer Center please come in thru the  Main Entrance and check in at the main information desk   Today you received the following chemotherapy agents Kadcyla. Follow-up as scheduled. Call clinic for any questions or concerns  To help prevent nausea and vomiting after your treatment, we encourage you to take your nausea medication   If you develop nausea and vomiting, or diarrhea that is not controlled by your medication, call the clinic.  The clinic phone number is [phone]. Office hours are Monday-Friday 8:30am-5:00pm.  BELOW ARE SYMPTOMS THAT SHOULD BE REPORTED IMMEDIATELY:  *FEVER GREATER THAN 101.0 F  *CHILLS WITH OR WITHOUT FEVER  NAUSEA AND VOMITING THAT IS NOT CONTROLLED WITH YOUR NAUSEA MEDICATION  *UNUSUAL SHORTNESS OF BREATH  *UNUSUAL BRUISING OR BLEEDING  TENDERNESS IN MOUTH AND THROAT WITH OR WITHOUT PRESENCE OF ULCERS  *URINARY PROBLEMS  *BOWEL PROBLEMS  UNUSUAL RASH Items with * indicate a potential emergency and should be followed up as soon as possible. If you have an emergency after office hours please contact your primary care physician or go to the nearest emergency department.  Please call the clinic during office hours if you have any questions or concerns.   You may also contact the Patient Navigator at [phone] should you have any questions or need assistance in obtaining follow up care.      Resources For Cancer Patients and their Caregivers ? American Cancer Society: Can assist with transportation, wigs, general needs, runs Look Good Feel Better.        1-888-227-6333 ? Cancer Care: Provides financial assistance, online support groups, medication/co-pay assistance.  1-800-813-HOPE  (4673) ? [facility] Assists [place] cancer patients and their families through emotional , educational and financial support.  336-427-4357 ? [place] DSS Where to apply for food stamps, Medicaid and utility assistance. 336-342-1394 ? [facility]: Transportation to medical appointments. 336-347-2287 ? Social Security Administration: May apply for disability if have a Stage IV cancer. 336-342-7796 1-800-772-1213 ? [place] Aging, Disability and Transit Services: Assists with nutrition, care and transit needs. 336-349-2343         

## 2019-09-01 NOTE — Patient Instructions (Signed)
[place] at [facility] Discharge Instructions  Follow up in 3 weeks with labs, treatment, PET scan, and ECHO.    Thank you for choosing [place] at [facility] to provide your oncology and hematology care.  To afford each patient quality time with our provider, please arrive at least 15 minutes before your scheduled appointment time.   If you have a lab appointment with the [facility] please come in thru the Main Entrance and check in at the main information desk.  You need to re-schedule your appointment should you arrive 10 or more minutes late.  We strive to give you quality time with our providers, and arriving late affects you and other patients whose appointments are after yours.  Also, if you no show three or more times for appointments you may be dismissed from the clinic at the providers discretion.     Again, thank you for choosing [facility].  Our hope is that these requests will decrease the amount of time that you wait before being seen by our physicians.       _____________________________________________________________  Should you have questions after your visit to [facility], please contact our office at [phone] between the hours of 8:00 a.m. and 4:30 p.m.  Voicemails left after 4:00 p.m. will not be returned until the following business day.  For prescription refill requests, have your pharmacy contact our office and allow 72 hours.    Due to Covid, you will need to wear a mask upon entering the hospital. If you do not have a mask, a mask will be given to you at the Main Entrance upon arrival. For doctor visits, patients may have 1 support person with them. For treatment visits, patients can not have anyone with them due to social distancing guidelines and our immunocompromised population.

## 2019-09-01 NOTE — Progress Notes (Signed)
0900 Labs reviewed with and pt seen by RLockamy NP and pt approved for Kadcyla infusion today along with 40 meq Potassium PO for K+ of 3.3 and extra 250 ml hydration with NS over 1 hour per NP.                              [name] tolerated Kadcyla infusion well without complaints or incident. VSS upon discharge. Pt discharged self ambulatory in satisfactory condition assisted by her husband

## 2019-09-01 NOTE — Progress Notes (Signed)
[name], [name]   CLINIC:  Medical Oncology/Hematology  PCP:  [name], MD [address] [place] [address] [phone]   REASON FOR VISIT: Follow-up for right breast cancer  CURRENT THERAPY: Kadcyla every 3 weeks  BRIEF ONCOLOGIC HISTORY:  Oncology History  Malignant neoplasm of upper-outer quadrant of right breast in female, estrogen receptor negative ([place])  07/03/2018 Initial Diagnosis   Palpable right breast mass for several months with skin discoloration, clinically skin trabecular thickening with nipple retraction measuring 7.2 x 4.3 x 4.2 cm, additional mass 4 o'clock position 2.6 cm, right axillary tail 1.2 cm right axillary lymph node 1.8 cm left breast irregular mass 1 cm, adjacent mass 0.9 cm together measuring 1.9 cm, no lymphadenopathy   07/03/2018 Pathology Results   Right breast biopsy: IDC grade 2, ER 0%, PR 0%, HER-2 positive, Ki-67 40%, lymph node biopsy negative, left breast biopsy complex sclerosing lesion    07/15/2018 Cancer Staging   Staging form: Breast, AJCC 8th Edition - Clinical: Stage IIB (cT3, cN0, cM0, G2, ER-, PR-, HER2+) - Signed by [name], MD on 07/15/2018   07/31/2018 - 10/30/2018 Chemotherapy   The patient had trastuzumab (HERCEPTIN) 336 mg in sodium chloride 0.9 % 250 mL chemo infusion, 4 mg/kg = 336 mg, Intravenous,  Once, 4 of 4 cycles Administration: 336 mg (07/31/2018), 168 mg (08/07/2018), 168 mg (08/28/2018), 168 mg (08/14/2018), 168 mg (08/21/2018), 168 mg (09/04/2018), 168 mg (09/11/2018), 168 mg (09/18/2018), 168 mg (10/02/2018), 168 mg (10/09/2018), 168 mg (10/16/2018), 168 mg (10/23/2018), 168 mg (10/30/2018) PACLitaxel (TAXOL) 78 mg in sodium chloride 0.9 % 250 mL chemo infusion (</= 52m/m2), 40 mg/m2 = 78 mg (50 % of original dose 80 mg/m2), Intravenous,  Once, 4 of 4 cycles Dose modification: 40 mg/m2 (50 % of original dose 80 mg/m2, Cycle 1, Reason: Patient Age), 53.3333  mg/m2 (66.7 % of original dose 80 mg/m2, Cycle 1, Reason: Provider Judgment), 45 mg/m2 (original dose 80 mg/m2, Cycle 3, Reason: Provider Judgment) Administration: 78 mg (07/31/2018), 102 mg (08/07/2018), 102 mg (08/28/2018), 102 mg (08/14/2018), 102 mg (08/21/2018), 102 mg (09/04/2018), 102 mg (09/18/2018), 84 mg (10/02/2018), 84 mg (10/09/2018), 84 mg (10/16/2018), 84 mg (10/23/2018), 84 mg (10/30/2018)  for chemotherapy treatment.    11/13/2018 - 02/14/2019 Chemotherapy   The patient had trastuzumab (HERCEPTIN) 504 mg in sodium chloride 0.9 % 250 mL chemo infusion, 6 mg/kg = 504 mg (100 % of original dose 6 mg/kg), Intravenous,  Once, 4 of 5 cycles Dose modification: 6 mg/kg (original dose 6 mg/kg, Cycle 1, Reason: Other (see comments), Comment: pt receving it ) Administration: 504 mg (11/13/2018), 504 mg (12/04/2018), 504 mg (01/04/2019), 504 mg (01/25/2019)  for chemotherapy treatment.    02/15/2019 -  Chemotherapy   The patient had ado-trastuzumab emtansine (KADCYLA) 240 mg in sodium chloride 0.9 % 250 mL chemo infusion, 3 mg/kg = 240 mg (100 % of original dose 3 mg/kg), Intravenous, Once, 10 of 12 cycles Dose modification: 3 mg/kg (original dose 3 mg/kg, Cycle 1, Reason: Provider Judgment), 3 mg/kg (original dose 3 mg/kg, Cycle 2, Reason: Provider Judgment) Administration: 240 mg (02/15/2019), 240 mg (03/08/2019), 240 mg (05/10/2019), 240 mg (03/29/2019), 240 mg (04/19/2019), 240 mg (06/01/2019), 240 mg (06/22/2019), 240 mg (07/20/2019), 240 mg (08/10/2019)  for chemotherapy treatment.       CANCER STAGING: Cancer Staging Malignant neoplasm of upper-outer quadrant of right breast in female, estrogen receptor negative [place] Staging form:  Breast, AJCC 8th Edition - Clinical: Stage IIB (cT3, cN0, cM0, G2, ER-, PR-, HER2+) - Signed by [name], MD on 07/15/2018    INTERVAL HISTORY:  Ms. Starzyk 85 y.o. female returns for routine follow-up for right breast cancer.  Patient reports she has been doing well  since her last visit and treatment.  She has no complaints at this time. Denies any nausea, vomiting, or diarrhea. Denies any new pains. Had not noticed any recent bleeding such as epistaxis, hematuria or hematochezia. Denies recent chest pain on exertion, shortness of breath on minimal exertion, pre-syncopal episodes, or palpitations. Denies any numbness or tingling in hands or feet. Denies any recent fevers, infections, or recent hospitalizations. Patient reports appetite at 100% and energy level at 50%.  She is eating well maintain her weight at this time.     REVIEW OF SYSTEMS:  Review of Systems  All other systems reviewed and are negative.    PAST MEDICAL/SURGICAL HISTORY:  Past Medical History:  Diagnosis Date  . Cancer ([place]) 06/2018   right breast cancer  . GERD (gastroesophageal reflux disease)    occasional   . Hyperlipidemia   . Hypertension    clearance with note Dr Nevada Crane on chart  . Pneumonia    2 years ago/ states occ cough with sinus drainage- no fever  . Thyroid nodule    with biopsy- states following medically   Past Surgical History:  Procedure Laterality Date  . CATARACT EXTRACTION W/PHACO  11/30/2012   Procedure: CATARACT EXTRACTION PHACO AND INTRAOCULAR LENS PLACEMENT (IOC);  Surgeon: Williams Che, MD;  Location: [facility];  Service: Ophthalmology;  Laterality: Left;  CDE:11.49  . CATARACT EXTRACTION W/PHACO Right 03/15/2013   Procedure: CATARACT EXTRACTION PHACO AND INTRAOCULAR LENS PLACEMENT (IOC);  Surgeon: Williams Che, MD;  Location: [facility];  Service: Ophthalmology;  Laterality: Right;  CDE 16.15  . COLONOSCOPY    . EXCISION OF SKIN TAG Right 06/17/2016   Procedure: SHAVE OF SKIN TAG RIGHT BUTTOCK;  Surgeon: Aviva Signs, MD;  Location: [facility];  Service: General;  Laterality: Right;  . JOINT REPLACEMENT     left knee  . MASS EXCISION Left 06/17/2016   Procedure: EXCISION SKIN MALIGNANT LESION LEFT BUTTOCK;  Surgeon: Aviva Signs, MD;  Location: [facility];   Service: General;  Laterality: Left;  . PORTACATH PLACEMENT Right 07/28/2018   Procedure: INSERTION PORT-A-CATH WITH ULTRASOUND;  Surgeon: Rolm Bookbinder, MD;  Location: [facility];  Service: General;  Laterality: Right;  . PUNCH BIOPSY OF SKIN Right 07/28/2018   Procedure: PUNCH BIOPSY OF SKIN RIGHT BREAST;  Surgeon: Rolm Bookbinder, MD;  Location: [facility];  Service: General;  Laterality: Right;  . TOTAL KNEE ARTHROPLASTY  12/16/2011   Procedure: TOTAL KNEE ARTHROPLASTY;  Surgeon: Gearlean Alf, MD;  Location: [facility];  Service: Orthopedics;  Laterality: Right;  . TUBAL LIGATION       SOCIAL HISTORY:  Social History   Socioeconomic History  . Marital status: Married    Spouse name: Not on file  . Number of children: 2  . Years of education: some business school after HS  . Highest education level: Not on file  Occupational History  . Occupation: Cabin crew  . Occupation: Network engineer   Social Needs  . Financial resource strain: Not hard at all  . Food insecurity    Worry: Never true    Inability: Never true  . Transportation needs    Medical: No  Non-medical: No  Tobacco Use  . Smoking status: Never Smoker  . Smokeless tobacco: Never Used  Substance and Sexual Activity  . Alcohol use: No    Frequency: Never  . Drug use: No  . Sexual activity: Yes    Birth control/protection: None  Lifestyle  . Physical activity    Days per week: 0 days    Minutes per session: 0 min  . Stress: Not at all  Relationships  . Social connections    Talks on phone: More than three times a week    Gets together: More than three times a week    Attends religious service: More than 4 times per year    Active member of club or organization: Yes    Attends meetings of clubs or organizations: Never    Relationship status: Married  . Intimate partner violence    Fear of current or ex partner: No    Emotionally abused: No    Physically abused: No    Forced  sexual activity: No  Other Topics Concern  . Not on file  Social History Narrative   Lives at home with her husband.   4 cups caffeine per day.   Right-handed.    FAMILY HISTORY:  Family History  Problem Relation Age of Onset  . Heart attack Mother   . Bronchitis Father   . Diabetes Sister   . Leukemia Brother   . Lung disease Brother   . Lung disease Sister     CURRENT MEDICATIONS:  Outpatient Encounter Medications as of 09/01/2019  Medication Sig  . aspirin 325 MG tablet Take 162.5 mg by mouth daily.   . cetirizine (ZYRTEC) 10 MG tablet Take 10 mg by mouth daily.  . hydrocortisone 1 % ointment Apply 1 application topically 2 (two) times daily.  Marland Kitchen lidocaine-prilocaine (EMLA) cream Apply to port site one hour prior to appointment and cover with plastic wrap.  . Multiple Vitamin (MULTIVITAMIN WITH MINERALS) TABS tablet Take 1 tablet by mouth daily.  . Omega-3 Fatty Acids (FISH OIL PO) Take 1 capsule by mouth daily.   Glory Rosebush ULTRA test strip USE 1 STRIP TO CHECK GLUCOSE 2 TO 3 TIMES DAILY  . PACLitaxel (TAXOL IV) Inject into the vein once a week.  . Trastuzumab (HERCEPTIN IV) Inject into the vein once a week.   Marland Kitchen acetaminophen (TYLENOL) 325 MG tablet Take 650 mg by mouth every 6 (six) hours as needed.  . Misc. Devices MISC Please provide lymphedema sleeve for right arm (Patient not taking: Reported on 07/13/2019)   No facility-administered encounter medications on file as of 09/01/2019.     ALLERGIES:  Allergies  Allergen Reactions  . Penicillins Rash    Has patient had a PCN reaction causing immediate rash, facial/tongue/throat swelling, SOB or lightheadedness with hypotension: No Has patient had a PCN reaction causing severe rash involving mucus membranes or skin necrosis: No Has patient had a PCN reaction that required hospitalization: No Has patient had a PCN reaction occurring within the last 10 years: No If all of the above answers are "NO", then may proceed with  Cephalosporin use.      PHYSICAL EXAM:  ECOG Performance status: 1  Vital signs: Blood pressure-149/77 Pulse-113 Respirations-18 Temperature-97.3 O2 sats-98% on room air   Physical Exam Constitutional:      Appearance: Normal appearance. She is normal weight.  Cardiovascular:     Rate and Rhythm: Normal rate and regular rhythm.     Heart sounds: Normal  heart sounds.  Pulmonary:     Effort: Pulmonary effort is normal.     Breath sounds: Normal breath sounds.  Abdominal:     General: Bowel sounds are normal.     Palpations: Abdomen is soft.  Musculoskeletal: Normal range of motion.  Skin:    General: Skin is warm.  Neurological:     Mental Status: She is alert and oriented to person, place, and time. Mental status is at baseline.  Psychiatric:        Mood and Affect: Mood normal.        Behavior: Behavior normal.        Thought Content: Thought content normal.        Judgment: Judgment normal.      LABORATORY DATA:  I have reviewed the labs as listed.  CBC    Component Value Date/Time   WBC 10.5 09/01/2019 0807   RBC 3.88 09/01/2019 0807   HGB 11.1 (L) 09/01/2019 0807   HGB 12.7 11/24/2017 1626   HCT 34.8 (L) 09/01/2019 0807   HCT 36.6 11/24/2017 1626   PLT 282 09/01/2019 0807   PLT 420 (H) 11/24/2017 1626   MCV 89.7 09/01/2019 0807   MCV 88 11/24/2017 1626   MCH 28.6 09/01/2019 0807   MCHC 31.9 09/01/2019 0807   RDW 19.1 (H) 09/01/2019 0807   RDW 15.4 11/24/2017 1626   LYMPHSABS 3.6 09/01/2019 0807   LYMPHSABS 2.5 11/24/2017 1626   MONOABS 1.0 09/01/2019 0807   EOSABS 0.7 (H) 09/01/2019 0807   EOSABS 0.1 11/24/2017 1626   BASOSABS 0.1 09/01/2019 0807   BASOSABS 0.0 11/24/2017 1626   CMP Latest Ref Rng & Units 09/01/2019 08/10/2019 07/20/2019  Glucose 70 - 99 mg/dL 126(H) 115(H) 112(H)  BUN 8 - 23 mg/dL 18 15 15   Creatinine 0.44 - 1.00 mg/dL 1.17(H) 1.04(H) 1.07(H)  Sodium 135 - 145 mmol/L 138 135 135  Potassium 3.5 - 5.1 mmol/L 3.3(L) 3.3(L)  3.4(L)  Chloride 98 - 111 mmol/L 107 105 105  CO2 22 - 32 mmol/L 19(L) 20(L) 22  Calcium 8.9 - 10.3 mg/dL 9.5 8.8(L) 9.1  Total Protein 6.5 - 8.1 g/dL 7.2 7.1 6.8  Total Bilirubin 0.3 - 1.2 mg/dL 1.1 0.7 0.6  Alkaline Phos 38 - 126 U/L 139(H) 124 132(H)  AST 15 - 41 U/L 77(H) 91(H) 92(H)  ALT 0 - 44 U/L 35 39 44     I personally performed a face-to-face visit.  All questions were answered to patient's stated satisfaction. Encouraged patient to call with any new concerns or questions before his next visit to the cancer center and we can certain see him sooner, if needed.     ASSESSMENT & PLAN:   Malignant neoplasm of upper-outer quadrant of right breast in female, estrogen receptor negative ([place]) 1.  Metastatic right breast cancer to the skin, ER/PR negative, HER-2 positive: -Initial presentation with skin involvement of the right breast, axillary region and posterior chest wall. -Skin punch biopsy on 07/28/2018 in the right breast positive for DLI. -12 cycles of weekly Taxol and Herceptin from 07/31/2018-10/30/2018. -PET/CT on 02/05/2019 showing local progression of disease with enlarging and increasing hypermetabolic right breast mass, multiple tiny 2 to 4 mm pulmonary nodule stable.  No definite sign of metastatic disease elsewhere. -8 cycles of Kadcyla from 02/15/2019 through 07/20/2019. -PET scan after 3 cycles on 04/15/2019 showed persistent and intense focal right breast hypermetabolic some, slightly increased with no evidence of metastatic disease. -Physical examination shows complete resolution of  erythema of right breast, right axillary region and posterior chest wall with excellent response.  Patient denies any symptoms of PND or orthopnea. -She is tolerating Kadcyla very well.  Total bilirubin today is 1.1.  She will proceed with treatment today.  Creatinine slightly increased to 1.17 we will give an extra 250 cc of normal saline today. -She will follow-up in 3 weeks with a PET scan  and echocardiogram and treatment.  2.  High risk drug monitoring: -Echo on 06/18/2019 shows EF more than 65%, hyperdynamic. -She will repeat her echo in 3 weeks prior to her next appointment.      Orders placed this encounter:  Orders Placed This Encounter  Procedures  . Magnesium  . CBC with Differential/Platelet  . Comprehensive metabolic panel  . Lactate dehydrogenase     Francene Finders, FNP-C [place] [phone]

## 2019-09-01 NOTE — Assessment & Plan Note (Addendum)
1.  Metastatic right breast cancer to the skin, ER/PR negative, HER-2 positive: -Initial presentation with skin involvement of the right breast, axillary region and posterior chest wall. -Skin punch biopsy on 07/28/2018 in the right breast positive for DLI. -12 cycles of weekly Taxol and Herceptin from 07/31/2018-10/30/2018. -PET/CT on 02/05/2019 showing local progression of disease with enlarging and increasing hypermetabolic right breast mass, multiple tiny 2 to 4 mm pulmonary nodule stable.  No definite sign of metastatic disease elsewhere. -8 cycles of Kadcyla from 02/15/2019 through 07/20/2019. -PET scan after 3 cycles on 04/15/2019 showed persistent and intense focal right breast hypermetabolic some, slightly increased with no evidence of metastatic disease. -Physical examination shows complete resolution of erythema of right breast, right axillary region and posterior chest wall with excellent response.  Patient denies any symptoms of PND or orthopnea. -She is tolerating Kadcyla very well.  Total bilirubin today is 1.1.  She will proceed with treatment today.  Creatinine slightly increased to 1.17 we will give an extra 250 cc of normal saline today. -She will follow-up in 3 weeks with a PET scan and echocardiogram and treatment.  2.  High risk drug monitoring: -Echo on 06/18/2019 shows EF more than 65%, hyperdynamic. -She will repeat her echo in 3 weeks prior to her next appointment.

## 2019-09-01 NOTE — Patient Instructions (Signed)
[facility] at [facility] Discharge Instructions  Labs drawn from portacath today   Thank you for choosing [facility] at [facility] to provide your oncology and hematology care.  To afford each patient quality time with our provider, please arrive at least 15 minutes before your scheduled appointment time.   If you have a lab appointment with the Cancer Center please come in thru the Main Entrance and check in at the main information desk.  You need to re-schedule your appointment should you arrive 10 or more minutes late.  We strive to give you quality time with our providers, and arriving late affects you and other patients whose appointments are after yours.  Also, if you no show three or more times for appointments you may be dismissed from the clinic at the providers discretion.     Again, thank you for choosing  Cancer Center.  Our hope is that these requests will decrease the amount of time that you wait before being seen by our physicians.       _____________________________________________________________  Should you have questions after your visit to  Cancer Center, please contact our office at [phone] between the hours of 8:00 a.m. and 4:30 p.m.  Voicemails left after 4:00 p.m. will not be returned until the following business day.  For prescription refill requests, have your pharmacy contact our office and allow 72 hours.    Due to Covid, you will need to wear a mask upon entering the hospital. If you do not have a mask, a mask will be given to you at the Main Entrance upon arrival. For doctor visits, patients may have 1 support person with them. For treatment visits, patients can not have anyone with them due to social distancing guidelines and our immunocompromised population.     

## 2019-09-08 DIAGNOSIS — R2243 Localized swelling, mass and lump, lower limb, bilateral: Secondary | ICD-10-CM | POA: Diagnosis not present

## 2019-09-08 DIAGNOSIS — Z6828 Body mass index (BMI) 28.0-28.9, adult: Secondary | ICD-10-CM | POA: Diagnosis not present

## 2019-09-08 DIAGNOSIS — S31829A Unspecified open wound of left buttock, initial encounter: Secondary | ICD-10-CM | POA: Diagnosis not present

## 2019-09-08 DIAGNOSIS — R7301 Impaired fasting glucose: Secondary | ICD-10-CM | POA: Diagnosis not present

## 2019-09-08 DIAGNOSIS — E1121 Type 2 diabetes mellitus with diabetic nephropathy: Secondary | ICD-10-CM | POA: Diagnosis not present

## 2019-09-08 DIAGNOSIS — R2240 Localized swelling, mass and lump, unspecified lower limb: Secondary | ICD-10-CM | POA: Diagnosis not present

## 2019-09-08 DIAGNOSIS — E559 Vitamin D deficiency, unspecified: Secondary | ICD-10-CM | POA: Diagnosis not present

## 2019-09-08 DIAGNOSIS — E7801 Familial hypercholesterolemia: Secondary | ICD-10-CM | POA: Diagnosis not present

## 2019-09-08 DIAGNOSIS — L539 Erythematous condition, unspecified: Secondary | ICD-10-CM | POA: Diagnosis not present

## 2019-09-08 DIAGNOSIS — N182 Chronic kidney disease, stage 2 (mild): Secondary | ICD-10-CM | POA: Diagnosis not present

## 2019-09-13 ENCOUNTER — Other Ambulatory Visit: Payer: Self-pay

## 2019-09-13 ENCOUNTER — Other Ambulatory Visit (HOSPITAL_COMMUNITY): Payer: Self-pay | Admitting: Nurse Practitioner

## 2019-09-13 ENCOUNTER — Ambulatory Visit (HOSPITAL_COMMUNITY)
Admission: RE | Admit: 2019-09-13 | Discharge: 2019-09-13 | Disposition: A | Discharge status: Home / Self Care | Payer: Medicare HMO | Source: Ambulatory Visit | Attending: Hematology | Admitting: Hematology

## 2019-09-13 DIAGNOSIS — C50411 Malignant neoplasm of upper-outer quadrant of right female breast: Secondary | ICD-10-CM | POA: Diagnosis not present

## 2019-09-13 DIAGNOSIS — I119 Hypertensive heart disease without heart failure: Secondary | ICD-10-CM | POA: Insufficient documentation

## 2019-09-13 DIAGNOSIS — K219 Gastro-esophageal reflux disease without esophagitis: Secondary | ICD-10-CM | POA: Insufficient documentation

## 2019-09-13 DIAGNOSIS — E785 Hyperlipidemia, unspecified: Secondary | ICD-10-CM | POA: Insufficient documentation

## 2019-09-13 DIAGNOSIS — Z171 Estrogen receptor negative status [ER-]: Secondary | ICD-10-CM | POA: Diagnosis not present

## 2019-09-13 DIAGNOSIS — I083 Combined rheumatic disorders of mitral, aortic and tricuspid valves: Secondary | ICD-10-CM | POA: Insufficient documentation

## 2019-09-13 NOTE — Progress Notes (Signed)
*  PRELIMINARY RESULTS* Echocardiogram 2D Echocardiogram has been performed.  [name] 09/13/2019, 9:37 AM

## 2019-09-20 ENCOUNTER — Other Ambulatory Visit: Payer: Self-pay

## 2019-09-20 ENCOUNTER — Encounter (HOSPITAL_COMMUNITY)
Admission: RE | Admit: 2019-09-20 | Discharge: 2019-09-20 | Disposition: A | Discharge status: Home / Self Care | Payer: Medicare HMO | Source: Ambulatory Visit | Attending: Hematology | Admitting: Hematology

## 2019-09-20 DIAGNOSIS — C50911 Malignant neoplasm of unspecified site of right female breast: Secondary | ICD-10-CM | POA: Diagnosis not present

## 2019-09-20 DIAGNOSIS — Z171 Estrogen receptor negative status [ER-]: Secondary | ICD-10-CM | POA: Insufficient documentation

## 2019-09-20 DIAGNOSIS — C50411 Malignant neoplasm of upper-outer quadrant of right female breast: Secondary | ICD-10-CM | POA: Insufficient documentation

## 2019-09-20 MED ORDER — FLUDEOXYGLUCOSE F - 18 (FDG) INJECTION
8.8200 | Freq: Once | INTRAVENOUS | Status: AC | PRN
  Administered 2019-09-20: 8.82 via INTRAVENOUS

## 2019-09-21 ENCOUNTER — Ambulatory Visit (HOSPITAL_COMMUNITY): Payer: Medicare HMO | Admitting: Hematology

## 2019-09-21 ENCOUNTER — Ambulatory Visit (HOSPITAL_COMMUNITY): Payer: Medicare HMO

## 2019-09-21 ENCOUNTER — Other Ambulatory Visit (HOSPITAL_COMMUNITY): Payer: Medicare HMO

## 2019-09-22 ENCOUNTER — Inpatient Hospital Stay (HOSPITAL_COMMUNITY): Payer: Medicare HMO

## 2019-09-22 ENCOUNTER — Inpatient Hospital Stay (HOSPITAL_BASED_OUTPATIENT_CLINIC_OR_DEPARTMENT_OTHER): Payer: Medicare HMO | Admitting: Hematology

## 2019-09-22 ENCOUNTER — Encounter (HOSPITAL_COMMUNITY): Payer: Self-pay

## 2019-09-22 ENCOUNTER — Encounter (HOSPITAL_COMMUNITY): Payer: Self-pay | Admitting: Hematology

## 2019-09-22 ENCOUNTER — Other Ambulatory Visit: Payer: Self-pay

## 2019-09-22 VITALS — BP 141/73 | HR 96 | Temp 97.5°F | Resp 18

## 2019-09-22 VITALS — BP 157/84 | HR 110 | Temp 97.7°F | Resp 18 | Wt 166.8 lb

## 2019-09-22 DIAGNOSIS — Z5112 Encounter for antineoplastic immunotherapy: Secondary | ICD-10-CM | POA: Diagnosis not present

## 2019-09-22 DIAGNOSIS — C50411 Malignant neoplasm of upper-outer quadrant of right female breast: Secondary | ICD-10-CM

## 2019-09-22 DIAGNOSIS — Z171 Estrogen receptor negative status [ER-]: Secondary | ICD-10-CM

## 2019-09-22 DIAGNOSIS — E876 Hypokalemia: Secondary | ICD-10-CM

## 2019-09-22 LAB — COMPREHENSIVE METABOLIC PANEL
ALT: 33 U/L (ref 0–44)
AST: 76 U/L — ABNORMAL HIGH (ref 15–41)
Albumin: 2.7 g/dL — ABNORMAL LOW (ref 3.5–5.0)
Alkaline Phosphatase: 136 U/L — ABNORMAL HIGH (ref 38–126)
Anion gap: 13 (ref 5–15)
BUN: 15 mg/dL (ref 8–23)
CO2: 19 mmol/L — ABNORMAL LOW (ref 22–32)
Calcium: 8.9 mg/dL (ref 8.9–10.3)
Chloride: 105 mmol/L (ref 98–111)
Creatinine, Ser: 1.16 mg/dL — ABNORMAL HIGH (ref 0.44–1.00)
GFR calc Af Amer: 49 mL/min — ABNORMAL LOW (ref >60)
GFR calc non Af Amer: 42 mL/min — ABNORMAL LOW (ref >60)
Glucose, Bld: 124 mg/dL — ABNORMAL HIGH (ref 70–99)
Potassium: 3 mmol/L — ABNORMAL LOW (ref 3.5–5.1)
Sodium: 137 mmol/L (ref 135–145)
Total Bilirubin: 1.2 mg/dL (ref 0.3–1.2)
Total Protein: 6.7 g/dL (ref 6.5–8.1)

## 2019-09-22 LAB — CBC WITH DIFFERENTIAL/PLATELET
Abs Immature Granulocytes: 0.01 10*3/uL (ref 0.00–0.07)
Basophils Absolute: 0.1 10*3/uL (ref 0.0–0.1)
Basophils Relative: 1 %
Eosinophils Absolute: 0.5 10*3/uL (ref 0.0–0.5)
Eosinophils Relative: 6 %
HCT: 32.3 % — ABNORMAL LOW (ref 36.0–46.0)
Hemoglobin: 10.5 g/dL — ABNORMAL LOW (ref 12.0–15.0)
Immature Granulocytes: 0 %
Lymphocytes Relative: 37 %
Lymphs Abs: 2.9 10*3/uL (ref 0.7–4.0)
MCH: 28.8 pg (ref 26.0–34.0)
MCHC: 32.5 g/dL (ref 30.0–36.0)
MCV: 88.7 fL (ref 80.0–100.0)
Monocytes Absolute: 1 10*3/uL (ref 0.1–1.0)
Monocytes Relative: 13 %
Neutro Abs: 3.3 10*3/uL (ref 1.7–7.7)
Neutrophils Relative %: 43 %
Platelets: 245 10*3/uL (ref 150–400)
RBC: 3.64 MIL/uL — ABNORMAL LOW (ref 3.87–5.11)
RDW: 19 % — ABNORMAL HIGH (ref 11.5–15.5)
WBC: 7.8 10*3/uL (ref 4.0–10.5)
nRBC: 0 % (ref 0.0–0.2)

## 2019-09-22 MED ORDER — ACETAMINOPHEN 325 MG PO TABS
650.0000 mg | ORAL_TABLET | Freq: Once | ORAL | Status: AC
  Administered 2019-09-22: 650 mg via ORAL

## 2019-09-22 MED ORDER — SODIUM CHLORIDE 0.9 % IV SOLN
Freq: Once | INTRAVENOUS | Status: AC
  Administered 2019-09-22: 09:00:00 via INTRAVENOUS

## 2019-09-22 MED ORDER — DIPHENHYDRAMINE HCL 25 MG PO CAPS
50.0000 mg | ORAL_CAPSULE | Freq: Once | ORAL | Status: AC
  Administered 2019-09-22: 50 mg via ORAL

## 2019-09-22 MED ORDER — ACETAMINOPHEN 325 MG PO TABS
ORAL_TABLET | ORAL | Status: AC
  Filled 2019-09-22: qty 2

## 2019-09-22 MED ORDER — SODIUM CHLORIDE 0.9% FLUSH
10.0000 mL | INTRAVENOUS | Status: DC | PRN
  Administered 2019-09-22 (×2): 10 mL
  Filled 2019-09-22 (×2): qty 10

## 2019-09-22 MED ORDER — DIPHENHYDRAMINE HCL 25 MG PO CAPS
ORAL_CAPSULE | ORAL | Status: AC
  Filled 2019-09-22: qty 2

## 2019-09-22 MED ORDER — POTASSIUM CHLORIDE CRYS ER 10 MEQ PO TBCR
40.0000 meq | EXTENDED_RELEASE_TABLET | Freq: Once | ORAL | Status: AC
  Administered 2019-09-22: 40 meq via ORAL
  Filled 2019-09-22: qty 4

## 2019-09-22 MED ORDER — HEPARIN SOD (PORK) LOCK FLUSH 100 UNIT/ML IV SOLN
500.0000 [IU] | Freq: Once | INTRAVENOUS | Status: AC | PRN
  Administered 2019-09-22: 500 [IU]

## 2019-09-22 MED ORDER — SODIUM CHLORIDE 0.9 % IV SOLN
2.4000 mg/kg | Freq: Once | INTRAVENOUS | Status: AC
  Administered 2019-09-22: 200 mg via INTRAVENOUS
  Filled 2019-09-22: qty 10

## 2019-09-22 NOTE — Patient Instructions (Signed)
[place] at [facility] Discharge Instructions  You were seen today by Dr. [name]. He went over your recent lab and scan results. He would like you to continue your current treatment. He will see you back in 3 weeks for labs, treatment and follow up.   Thank you for choosing [place] at [facility] to provide your oncology and hematology care.  To afford each patient quality time with our provider, please arrive at least 15 minutes before your scheduled appointment time.   If you have a lab appointment with the [place] [facility] please come in thru the  Main Entrance and check in at the main information desk  You need to re-schedule your appointment should you arrive 10 or more minutes late.  We strive to give you quality time with our providers, and arriving late affects you and other patients whose appointments are after yours.  Also, if you no show three or more times for appointments you may be dismissed from the clinic at the providers discretion.     Again, thank you for choosing [facility].  Our hope is that these requests will decrease the amount of time that you wait before being seen by our physicians.       _____________________________________________________________  Should you have questions after your visit to [facility], please contact our office at [phone] between the hours of 8:00 a.m. and 4:30 p.m.  Voicemails left after 4:00 p.m. will not be returned until the following business day.  For prescription refill requests, have your pharmacy contact our office and allow 72 hours.    Cancer Center Support Programs:   > Cancer Support Group  2nd Tuesday of the month 1pm-2pm, Journey Room

## 2019-09-22 NOTE — Patient Instructions (Signed)
[facility] Discharge Instructions for Patients Receiving Chemotherapy  Today you received the following chemotherapy agents   To help prevent nausea and vomiting after your treatment, we encourage you to take your nausea medication   If you develop nausea and vomiting that is not controlled by your nausea medication, call the clinic.   BELOW ARE SYMPTOMS THAT SHOULD BE REPORTED IMMEDIATELY:  *FEVER GREATER THAN 100.5 F  *CHILLS WITH OR WITHOUT FEVER  NAUSEA AND VOMITING THAT IS NOT CONTROLLED WITH YOUR NAUSEA MEDICATION  *UNUSUAL SHORTNESS OF BREATH  *UNUSUAL BRUISING OR BLEEDING  TENDERNESS IN MOUTH AND THROAT WITH OR WITHOUT PRESENCE OF ULCERS  *URINARY PROBLEMS  *BOWEL PROBLEMS  UNUSUAL RASH Items with * indicate a potential emergency and should be followed up as soon as possible.  Feel free to call the clinic should you have any questions or concerns. The clinic phone number is [phone].  Please show the CHEMO ALERT CARD at check-in to the Emergency Department and triage nurse.   

## 2019-09-22 NOTE — Progress Notes (Signed)
09/22/19  Potassium level - 3.0  Received order to give potassium chloride 40 meq orally x 1 in clinic  T.O. Dr Beckey Downing, LPN/Chantrice Hagg Ronnald Ramp, PharmD

## 2019-09-22 NOTE — Progress Notes (Signed)
Patient presents today for treatment and follow up with Dr. [name]. Labs pending. Pulse elevated upon arrival 110. Recheck 100. Pt has no complaints of any pain today. MAR reviewed. Pt has no complaints of any changes since the last visit. Pt ambulatory today with no assist device. Ambulatory holding husband's arm.   Proceed with treatment message received from [facility] LPN. Last echo 09/13/19 EF 60-70%.   Treatment given today per MD orders. Tolerated infusion without adverse affects. Vital signs stable. No complaints at this time. Discharged from clinic ambulatory. F/U with [facility] as scheduled.

## 2019-09-22 NOTE — Progress Notes (Signed)
[facility] [place], [place] [address]   CLINIC:  Medical Oncology/Hematology  PCP:  [name], MD [address] [place] [address] [phone]   REASON FOR VISIT: Follow-up for right breast cancer, ER-/PR-/HER2+  CURRENT THERAPY: Kadcyla every 3 weeks   BRIEF ONCOLOGIC HISTORY:  Oncology History  Malignant neoplasm of upper-outer quadrant of right breast in female, estrogen receptor negative ([place])  07/03/2018 Initial Diagnosis   Palpable right breast mass for several months with skin discoloration, clinically skin trabecular thickening with nipple retraction measuring 7.2 x 4.3 x 4.2 cm, additional mass 4 o'clock position 2.6 cm, right axillary tail 1.2 cm right axillary lymph node 1.8 cm left breast irregular mass 1 cm, adjacent mass 0.9 cm together measuring 1.9 cm, no lymphadenopathy   07/03/2018 Pathology Results   Right breast biopsy: IDC grade 2, ER 0%, PR 0%, HER-2 positive, Ki-67 40%, lymph node biopsy negative, left breast biopsy complex sclerosing lesion    07/15/2018 Cancer Staging   Staging form: Breast, AJCC 8th Edition - Clinical: Stage IIB (cT3, cN0, cM0, G2, ER-, PR-, HER2+) - Signed by [name], MD on 07/15/2018   07/31/2018 - 10/30/2018 Chemotherapy   The patient had trastuzumab (HERCEPTIN) 336 mg in sodium chloride 0.9 % 250 mL chemo infusion, 4 mg/kg = 336 mg, Intravenous,  Once, 4 of 4 cycles Administration: 336 mg (07/31/2018), 168 mg (08/07/2018), 168 mg (08/28/2018), 168 mg (08/14/2018), 168 mg (08/21/2018), 168 mg (09/04/2018), 168 mg (09/11/2018), 168 mg (09/18/2018), 168 mg (10/02/2018), 168 mg (10/09/2018), 168 mg (10/16/2018), 168 mg (10/23/2018), 168 mg (10/30/2018) PACLitaxel (TAXOL) 78 mg in sodium chloride 0.9 % 250 mL chemo infusion (</= 62m/m2), 40 mg/m2 = 78 mg (50 % of original dose 80 mg/m2), Intravenous,  Once, 4 of 4 cycles Dose modification: 40 mg/m2 (50 % of original dose 80 mg/m2, Cycle 1, Reason: Patient  Age), 53.3333 mg/m2 (66.7 % of original dose 80 mg/m2, Cycle 1, Reason: Provider Judgment), 45 mg/m2 (original dose 80 mg/m2, Cycle 3, Reason: Provider Judgment) Administration: 78 mg (07/31/2018), 102 mg (08/07/2018), 102 mg (08/28/2018), 102 mg (08/14/2018), 102 mg (08/21/2018), 102 mg (09/04/2018), 102 mg (09/18/2018), 84 mg (10/02/2018), 84 mg (10/09/2018), 84 mg (10/16/2018), 84 mg (10/23/2018), 84 mg (10/30/2018)  for chemotherapy treatment.    11/13/2018 - 02/14/2019 Chemotherapy   The patient had trastuzumab (HERCEPTIN) 504 mg in sodium chloride 0.9 % 250 mL chemo infusion, 6 mg/kg = 504 mg (100 % of original dose 6 mg/kg), Intravenous,  Once, 4 of 5 cycles Dose modification: 6 mg/kg (original dose 6 mg/kg, Cycle 1, Reason: Other (see comments), Comment: pt receving it ) Administration: 504 mg (11/13/2018), 504 mg (12/04/2018), 504 mg (01/04/2019), 504 mg (01/25/2019)  for chemotherapy treatment.    02/15/2019 -  Chemotherapy   The patient had ado-trastuzumab emtansine (KADCYLA) 240 mg in sodium chloride 0.9 % 250 mL chemo infusion, 3 mg/kg = 240 mg (100 % of original dose 3 mg/kg), Intravenous, Once, 11 of 14 cycles Dose modification: 3 mg/kg (original dose 3 mg/kg, Cycle 1, Reason: Provider Judgment), 3 mg/kg (original dose 3 mg/kg, Cycle 2, Reason: Provider Judgment), 2.4 mg/kg (original dose 3 mg/kg, Cycle 11, Reason: Other (see comments), Comment: bili upper limit of normal) Administration: 240 mg (02/15/2019), 240 mg (03/08/2019), 240 mg (05/10/2019), 240 mg (03/29/2019), 240 mg (04/19/2019), 240 mg (06/01/2019), 240 mg (06/22/2019), 240 mg (07/20/2019), 240 mg (08/10/2019), 240 mg (09/01/2019), 200 mg (09/22/2019)  for chemotherapy treatment.  CANCER STAGING: Cancer Staging Malignant neoplasm of upper-outer quadrant of right breast in female, estrogen receptor negative ([facility]) Staging form: Breast, AJCC 8th Edition - Clinical: Stage IIB (cT3, cN0, cM0, G2, ER-, PR-, HER2+) - Signed by [name],  MD on 07/15/2018    INTERVAL HISTORY:  Ms. Stetzer 85 y.o. female seen for follow-up of metastatic breast cancer to the chest wall.  She is receiving Kadcyla every 3 weeks.  Appetite and energy levels are 50%.  According to the husband she is not moving much at home.  She denies any new onset pains.  Denies any signs or symptoms of PND or orthopnea.  Denies any tingling or numbness in extremities.  No chest pains were reported.  Denies nausea, vomiting, diarrhea or constipation.   REVIEW OF SYSTEMS:  Review of Systems  Constitutional: Positive for fatigue.  All other systems reviewed and are negative.    PAST MEDICAL/SURGICAL HISTORY:  Past Medical History:  Diagnosis Date  . Cancer ([facility]) 06/2018   right breast cancer  . GERD (gastroesophageal reflux disease)    occasional   . Hyperlipidemia   . Hypertension    clearance with note Dr Nevada Crane on chart  . Pneumonia    2 years ago/ states occ cough with sinus drainage- no fever  . Thyroid nodule    with biopsy- states following medically   Past Surgical History:  Procedure Laterality Date  . CATARACT EXTRACTION W/PHACO  11/30/2012   Procedure: CATARACT EXTRACTION PHACO AND INTRAOCULAR LENS PLACEMENT (IOC);  Surgeon: Williams Che, MD;  Location: [facility];  Service: Ophthalmology;  Laterality: Left;  CDE:11.49  . CATARACT EXTRACTION W/PHACO Right 03/15/2013   Procedure: CATARACT EXTRACTION PHACO AND INTRAOCULAR LENS PLACEMENT (IOC);  Surgeon: Williams Che, MD;  Location: [facility];  Service: Ophthalmology;  Laterality: Right;  CDE 16.15  . COLONOSCOPY    . EXCISION OF SKIN TAG Right 06/17/2016   Procedure: SHAVE OF SKIN TAG RIGHT BUTTOCK;  Surgeon: Aviva Signs, MD;  Location: [facility];  Service: General;  Laterality: Right;  . JOINT REPLACEMENT     left knee  . MASS EXCISION Left 06/17/2016   Procedure: EXCISION SKIN MALIGNANT LESION LEFT BUTTOCK;  Surgeon: Aviva Signs, MD;  Location: [facility];  Service: General;  Laterality: Left;  .  PORTACATH PLACEMENT Right 07/28/2018   Procedure: INSERTION PORT-A-CATH WITH ULTRASOUND;  Surgeon: Rolm Bookbinder, MD;  Location: [facility];  Service: General;  Laterality: Right;  . PUNCH BIOPSY OF SKIN Right 07/28/2018   Procedure: PUNCH BIOPSY OF SKIN RIGHT BREAST;  Surgeon: Rolm Bookbinder, MD;  Location: [facility];  Service: General;  Laterality: Right;  . TOTAL KNEE ARTHROPLASTY  12/16/2011   Procedure: TOTAL KNEE ARTHROPLASTY;  Surgeon: Gearlean Alf, MD;  Location: [facility];  Service: Orthopedics;  Laterality: Right;  . TUBAL LIGATION       SOCIAL HISTORY:  Social History   Socioeconomic History  . Marital status: Married    Spouse name: Not on file  . Number of children: 2  . Years of education: some business school after HS  . Highest education level: Not on file  Occupational History  . Occupation: Cabin crew  . Occupation: Network engineer   Social Needs  . Financial resource strain: Not hard at all  . Food insecurity    Worry: Never true    Inability: Never true  . Transportation needs    Medical: No    Non-medical: No  Tobacco Use  .  Smoking status: Never Smoker  . Smokeless tobacco: Never Used  Substance and Sexual Activity  . Alcohol use: No    Frequency: Never  . Drug use: No  . Sexual activity: Yes    Birth control/protection: None  Lifestyle  . Physical activity    Days per week: 0 days    Minutes per session: 0 min  . Stress: Not at all  Relationships  . Social connections    Talks on phone: More than three times a week    Gets together: More than three times a week    Attends religious service: More than 4 times per year    Active member of club or organization: Yes    Attends meetings of clubs or organizations: Never    Relationship status: Married  . Intimate partner violence    Fear of current or ex partner: No    Emotionally abused: No    Physically abused: No    Forced sexual activity: No  Other Topics  Concern  . Not on file  Social History Narrative   Lives at home with her husband.   4 cups caffeine per day.   Right-handed.    FAMILY HISTORY:  Family History  Problem Relation Age of Onset  . Heart attack Mother   . Bronchitis Father   . Diabetes Sister   . Leukemia Brother   . Lung disease Brother   . Lung disease Sister     CURRENT MEDICATIONS:  Outpatient Encounter Medications as of 09/22/2019  Medication Sig  . acetaminophen (TYLENOL) 325 MG tablet Take 650 mg by mouth every 6 (six) hours as needed.  Marland Kitchen aspirin 325 MG tablet Take 162.5 mg by mouth daily.   . cetirizine (ZYRTEC) 10 MG tablet Take 10 mg by mouth daily.  . hydrocortisone 1 % ointment Apply 1 application topically 2 (two) times daily.  Marland Kitchen lidocaine-prilocaine (EMLA) cream Apply to port site one hour prior to appointment and cover with plastic wrap.  . Misc. Devices MISC Please provide lymphedema sleeve for right arm (Patient not taking: Reported on 07/13/2019)  . Multiple Vitamin (MULTIVITAMIN WITH MINERALS) TABS tablet Take 1 tablet by mouth daily.  . Omega-3 Fatty Acids (FISH OIL PO) Take 1 capsule by mouth daily.   Glory Rosebush ULTRA test strip USE 1 STRIP TO CHECK GLUCOSE 2 TO 3 TIMES DAILY  . PACLitaxel (TAXOL IV) Inject into the vein once a week.  . Trastuzumab (HERCEPTIN IV) Inject into the vein once a week.    No facility-administered encounter medications on file as of 09/22/2019.     ALLERGIES:  Allergies  Allergen Reactions  . Penicillins Rash    Has patient had a PCN reaction causing immediate rash, facial/tongue/throat swelling, SOB or lightheadedness with hypotension: No Has patient had a PCN reaction causing severe rash involving mucus membranes or skin necrosis: No Has patient had a PCN reaction that required hospitalization: No Has patient had a PCN reaction occurring within the last 10 years: No If all of the above answers are "NO", then may proceed with Cephalosporin use.       PHYSICAL EXAM:  ECOG Performance status: 1  Vitals:   09/22/19 0808  BP: (!) 157/84  Pulse: (!) 110  Resp: 18  Temp: 97.7 F (36.5 C)  SpO2: 98%   Filed Weights   09/22/19 0808  Weight: 166 lb 12.8 oz (75.7 kg)    Physical Exam Constitutional:      Appearance: Normal appearance. She is  normal weight.  Cardiovascular:     Rate and Rhythm: Normal rate and regular rhythm.     Heart sounds: Normal heart sounds.  Pulmonary:     Effort: Pulmonary effort is normal.     Breath sounds: Normal breath sounds.  Abdominal:     General: Bowel sounds are normal.     Palpations: Abdomen is soft.  Musculoskeletal: Normal range of motion.  Skin:    General: Skin is warm and dry.  Neurological:     Mental Status: She is alert and oriented to person, place, and time. Mental status is at baseline.  Psychiatric:        Mood and Affect: Mood normal.        Behavior: Behavior normal.        Thought Content: Thought content normal.        Judgment: Judgment normal.    Right breast mass is stable.  No erythema over the breast mass or posterior chest wall.  LABORATORY DATA:  I have reviewed the labs as listed.  CBC    Component Value Date/Time   WBC 7.8 09/22/2019 0759   RBC 3.64 (L) 09/22/2019 0759   HGB 10.5 (L) 09/22/2019 0759   HGB 12.7 11/24/2017 1626   HCT 32.3 (L) 09/22/2019 0759   HCT 36.6 11/24/2017 1626   PLT 245 09/22/2019 0759   PLT 420 (H) 11/24/2017 1626   MCV 88.7 09/22/2019 0759   MCV 88 11/24/2017 1626   MCH 28.8 09/22/2019 0759   MCHC 32.5 09/22/2019 0759   RDW 19.0 (H) 09/22/2019 0759   RDW 15.4 11/24/2017 1626   LYMPHSABS 2.9 09/22/2019 0759   LYMPHSABS 2.5 11/24/2017 1626   MONOABS 1.0 09/22/2019 0759   EOSABS 0.5 09/22/2019 0759   EOSABS 0.1 11/24/2017 1626   BASOSABS 0.1 09/22/2019 0759   BASOSABS 0.0 11/24/2017 1626   CMP Latest Ref Rng & Units 09/22/2019 09/01/2019 08/10/2019  Glucose 70 - 99 mg/dL 124(H) 126(H) 115(H)  BUN 8 - 23 mg/dL 15 18 15    Creatinine 0.44 - 1.00 mg/dL 1.16(H) 1.17(H) 1.04(H)  Sodium 135 - 145 mmol/L 137 138 135  Potassium 3.5 - 5.1 mmol/L 3.0(L) 3.3(L) 3.3(L)  Chloride 98 - 111 mmol/L 105 107 105  CO2 22 - 32 mmol/L 19(L) 19(L) 20(L)  Calcium 8.9 - 10.3 mg/dL 8.9 9.5 8.8(L)  Total Protein 6.5 - 8.1 g/dL 6.7 7.2 7.1  Total Bilirubin 0.3 - 1.2 mg/dL 1.2 1.1 0.7  Alkaline Phos 38 - 126 U/L 136(H) 139(H) 124  AST 15 - 41 U/L 76(H) 77(H) 91(H)  ALT 0 - 44 U/L 33 35 39       DIAGNOSTIC IMAGING:  I have independently reviewed the scans and discussed with the patient.    ASSESSMENT & PLAN:   Malignant neoplasm of upper-outer quadrant of right breast in female, estrogen receptor negative ([facility]) 1.  Metastatic right breast cancer to the skin, ER/PR negative, HER-2 positive: -Initial presentation with skin involvement of the right breast, axillary region and posterior chest wall. -Skin punch biopsy on 07/28/2018 in the right breast positive for DLI. -12 cycles of weekly Taxol and Herceptin from 07/31/2018-10/30/2018. -PET/CT on 02/05/2019 showing local progression of disease with enlarging and increasing hypermetabolic right breast mass, multiple tiny 2 to 4 mm pulmonary nodule stable.  No definite sign of metastatic disease elsewhere. -10 cycles of Kadcyla from 02/15/2019 through 09/01/2019. -PET scan after 3 cycles on 04/15/2019 showed persistent and intense focal right breast hypermetabolic some, slightly  increased with no evidence of metastatic disease. -I reviewed the films and results of the PET CT scan dated 09/20/2019.  There was a suggestion of progression on the PET CT scan. It appeared stable when I saw the scans. I have also talked to another radiologist Dr. Earma Reading who agreed with my assessment. -Physical examination showed palpable mass in the right breast which is more or less stable.  Slight warmth over it.  No erythema over the right breast area, right axillary area or right posterior chest wall. -She is  seem to be tolerating Kadcyla reasonably well.  She has some fatigue.  I have encouraged her to be physically more active. -Her total bilirubin is in the upper limit of normal at 1.2.  I will cut back on the dose of Kadcyla to 2.4 mg per metered squared for today's treatment.  We will resume 3 mg dose once her bilirubin falls back further. -We will see her back in 3 weeks for follow-up.  We will closely monitor her breast mass.  2.  High risk drug monitoring: -Echo on 09/13/2019 shows EF of 65 to 70%.  Left ventricle has hyperdynamic function.  Mildly increased left ventricular hypertrophy.  3.  Hypokalemia: -Potassium today is 3.0. -We will give her 40 mEq of potassium.  Total time spent is 25 minutes with more than 50% of the time spent face-to-face discussing  treatment plan, counseling and coordination of care.    Orders placed this encounter:  Orders Placed This Encounter  Procedures  . CBC with Differential/Platelet  . Comprehensive metabolic panel      Derek Jack, MD [place] [phone]

## 2019-09-22 NOTE — Assessment & Plan Note (Signed)
1.  Metastatic right breast cancer to the skin, ER/PR negative, HER-2 positive: -Initial presentation with skin involvement of the right breast, axillary region and posterior chest wall. -Skin punch biopsy on 07/28/2018 in the right breast positive for DLI. -12 cycles of weekly Taxol and Herceptin from 07/31/2018-10/30/2018. -PET/CT on 02/05/2019 showing local progression of disease with enlarging and increasing hypermetabolic right breast mass, multiple tiny 2 to 4 mm pulmonary nodule stable.  No definite sign of metastatic disease elsewhere. -10 cycles of Kadcyla from 02/15/2019 through 09/01/2019. -PET scan after 3 cycles on 04/15/2019 showed persistent and intense focal right breast hypermetabolic some, slightly increased with no evidence of metastatic disease. -I reviewed the films and results of the PET CT scan dated 09/20/2019.  There was a suggestion of progression on the PET CT scan. It appeared stable when I saw the scans. I have also talked to another radiologist Dr. Earma Reading who agreed with my assessment. -Physical examination showed palpable mass in the right breast which is more or less stable.  Slight warmth over it.  No erythema over the right breast area, right axillary area or right posterior chest wall. -She is seem to be tolerating Kadcyla reasonably well.  She has some fatigue.  I have encouraged her to be physically more active. -Her total bilirubin is in the upper limit of normal at 1.2.  I will cut back on the dose of Kadcyla to 2.4 mg per metered squared for today's treatment.  We will resume 3 mg dose once her bilirubin falls back further. -We will see her back in 3 weeks for follow-up.  We will closely monitor her breast mass.  2.  High risk drug monitoring: -Echo on 09/13/2019 shows EF of 65 to 70%.  Left ventricle has hyperdynamic function.  Mildly increased left ventricular hypertrophy.  3.  Hypokalemia: -Potassium today is 3.0. -We will give her 40 mEq of potassium.

## 2019-09-27 DIAGNOSIS — R69 Illness, unspecified: Secondary | ICD-10-CM | POA: Diagnosis not present

## 2019-10-13 ENCOUNTER — Encounter (HOSPITAL_COMMUNITY): Payer: Self-pay | Admitting: Hematology

## 2019-10-13 ENCOUNTER — Inpatient Hospital Stay (HOSPITAL_COMMUNITY): Payer: Medicare HMO

## 2019-10-13 ENCOUNTER — Other Ambulatory Visit: Payer: Self-pay

## 2019-10-13 ENCOUNTER — Inpatient Hospital Stay (HOSPITAL_COMMUNITY): Payer: Medicare HMO | Attending: Hematology | Admitting: Hematology

## 2019-10-13 VITALS — BP 133/68 | HR 102 | Temp 97.5°F | Resp 18 | Wt 162.2 lb

## 2019-10-13 DIAGNOSIS — R911 Solitary pulmonary nodule: Secondary | ICD-10-CM | POA: Insufficient documentation

## 2019-10-13 DIAGNOSIS — E876 Hypokalemia: Secondary | ICD-10-CM | POA: Insufficient documentation

## 2019-10-13 DIAGNOSIS — C50411 Malignant neoplasm of upper-outer quadrant of right female breast: Secondary | ICD-10-CM

## 2019-10-13 DIAGNOSIS — Z171 Estrogen receptor negative status [ER-]: Secondary | ICD-10-CM | POA: Insufficient documentation

## 2019-10-13 DIAGNOSIS — Z7982 Long term (current) use of aspirin: Secondary | ICD-10-CM | POA: Insufficient documentation

## 2019-10-13 DIAGNOSIS — R21 Rash and other nonspecific skin eruption: Secondary | ICD-10-CM | POA: Diagnosis not present

## 2019-10-13 DIAGNOSIS — Z79899 Other long term (current) drug therapy: Secondary | ICD-10-CM | POA: Insufficient documentation

## 2019-10-13 LAB — CBC WITH DIFFERENTIAL/PLATELET
Abs Immature Granulocytes: 0.01 10*3/uL (ref 0.00–0.07)
Basophils Absolute: 0.1 10*3/uL (ref 0.0–0.1)
Basophils Relative: 1 %
Eosinophils Absolute: 0.8 10*3/uL — ABNORMAL HIGH (ref 0.0–0.5)
Eosinophils Relative: 8 %
HCT: 34.4 % — ABNORMAL LOW (ref 36.0–46.0)
Hemoglobin: 11.3 g/dL — ABNORMAL LOW (ref 12.0–15.0)
Immature Granulocytes: 0 %
Lymphocytes Relative: 39 %
Lymphs Abs: 3.8 10*3/uL (ref 0.7–4.0)
MCH: 28.8 pg (ref 26.0–34.0)
MCHC: 32.8 g/dL (ref 30.0–36.0)
MCV: 87.5 fL (ref 80.0–100.0)
Monocytes Absolute: 1 10*3/uL (ref 0.1–1.0)
Monocytes Relative: 10 %
Neutro Abs: 4.1 10*3/uL (ref 1.7–7.7)
Neutrophils Relative %: 42 %
Platelets: 271 10*3/uL (ref 150–400)
RBC: 3.93 MIL/uL (ref 3.87–5.11)
RDW: 18.9 % — ABNORMAL HIGH (ref 11.5–15.5)
WBC: 9.7 10*3/uL (ref 4.0–10.5)
nRBC: 0 % (ref 0.0–0.2)

## 2019-10-13 LAB — COMPREHENSIVE METABOLIC PANEL
ALT: 41 U/L (ref 0–44)
AST: 99 U/L — ABNORMAL HIGH (ref 15–41)
Albumin: 2.9 g/dL — ABNORMAL LOW (ref 3.5–5.0)
Alkaline Phosphatase: 130 U/L — ABNORMAL HIGH (ref 38–126)
Anion gap: 13 (ref 5–15)
BUN: 14 mg/dL (ref 8–23)
CO2: 20 mmol/L — ABNORMAL LOW (ref 22–32)
Calcium: 9.5 mg/dL (ref 8.9–10.3)
Chloride: 104 mmol/L (ref 98–111)
Creatinine, Ser: 1.27 mg/dL — ABNORMAL HIGH (ref 0.44–1.00)
GFR calc Af Amer: 44 mL/min — ABNORMAL LOW (ref >60)
GFR calc non Af Amer: 38 mL/min — ABNORMAL LOW (ref >60)
Glucose, Bld: 129 mg/dL — ABNORMAL HIGH (ref 70–99)
Potassium: 3.3 mmol/L — ABNORMAL LOW (ref 3.5–5.1)
Sodium: 137 mmol/L (ref 135–145)
Total Bilirubin: 1.5 mg/dL — ABNORMAL HIGH (ref 0.3–1.2)
Total Protein: 7.2 g/dL (ref 6.5–8.1)

## 2019-10-13 MED ORDER — SODIUM CHLORIDE 0.9% FLUSH
10.0000 mL | Freq: Once | INTRAVENOUS | Status: AC
  Administered 2019-10-13: 10 mL

## 2019-10-13 MED ORDER — HEPARIN SOD (PORK) LOCK FLUSH 100 UNIT/ML IV SOLN
500.0000 [IU] | Freq: Once | INTRAVENOUS | Status: AC
  Administered 2019-10-13: 500 [IU] via INTRAVENOUS

## 2019-10-13 NOTE — Progress Notes (Signed)
[place], [place] [address]   CLINIC:  Medical Oncology/Hematology  PCP:  [name], MD [address] [place] [address] [phone]   REASON FOR VISIT: Follow-up for right breast cancer, ER-/PR-/HER2+  CURRENT THERAPY: Kadcyla every 3 weeks   BRIEF ONCOLOGIC HISTORY:  Oncology History  Malignant neoplasm of upper-outer quadrant of right breast in female, estrogen receptor negative ([place])  07/03/2018 Initial Diagnosis   Palpable right breast mass for several months with skin discoloration, clinically skin trabecular thickening with nipple retraction measuring 7.2 x 4.3 x 4.2 cm, additional mass 4 o'clock position 2.6 cm, right axillary tail 1.2 cm right axillary lymph node 1.8 cm left breast irregular mass 1 cm, adjacent mass 0.9 cm together measuring 1.9 cm, no lymphadenopathy   07/03/2018 Pathology Results   Right breast biopsy: IDC grade 2, ER 0%, PR 0%, HER-2 positive, Ki-67 40%, lymph node biopsy negative, left breast biopsy complex sclerosing lesion    07/15/2018 Cancer Staging   Staging form: Breast, AJCC 8th Edition - Clinical: Stage IIB (cT3, cN0, cM0, G2, ER-, PR-, HER2+) - Signed by [name], MD on 07/15/2018   07/31/2018 - 10/30/2018 Chemotherapy   The patient had trastuzumab (HERCEPTIN) 336 mg in sodium chloride 0.9 % 250 mL chemo infusion, 4 mg/kg = 336 mg, Intravenous,  Once, 4 of 4 cycles Administration: 336 mg (07/31/2018), 168 mg (08/07/2018), 168 mg (08/28/2018), 168 mg (08/14/2018), 168 mg (08/21/2018), 168 mg (09/04/2018), 168 mg (09/11/2018), 168 mg (09/18/2018), 168 mg (10/02/2018), 168 mg (10/09/2018), 168 mg (10/16/2018), 168 mg (10/23/2018), 168 mg (10/30/2018) PACLitaxel (TAXOL) 78 mg in sodium chloride 0.9 % 250 mL chemo infusion (</= 80m/m2), 40 mg/m2 = 78 mg (50 % of original dose 80 mg/m2), Intravenous,  Once, 4 of 4 cycles Dose modification: 40 mg/m2 (50 % of original dose 80 mg/m2, Cycle 1, Reason: Patient  Age), 53.3333 mg/m2 (66.7 % of original dose 80 mg/m2, Cycle 1, Reason: Provider Judgment), 45 mg/m2 (original dose 80 mg/m2, Cycle 3, Reason: Provider Judgment) Administration: 78 mg (07/31/2018), 102 mg (08/07/2018), 102 mg (08/28/2018), 102 mg (08/14/2018), 102 mg (08/21/2018), 102 mg (09/04/2018), 102 mg (09/18/2018), 84 mg (10/02/2018), 84 mg (10/09/2018), 84 mg (10/16/2018), 84 mg (10/23/2018), 84 mg (10/30/2018)  for chemotherapy treatment.    11/13/2018 - 02/14/2019 Chemotherapy   The patient had trastuzumab (HERCEPTIN) 504 mg in sodium chloride 0.9 % 250 mL chemo infusion, 6 mg/kg = 504 mg (100 % of original dose 6 mg/kg), Intravenous,  Once, 4 of 5 cycles Dose modification: 6 mg/kg (original dose 6 mg/kg, Cycle 1, Reason: Other (see comments), Comment: pt receving it ) Administration: 504 mg (11/13/2018), 504 mg (12/04/2018), 504 mg (01/04/2019), 504 mg (01/25/2019)  for chemotherapy treatment.    02/15/2019 -  Chemotherapy   The patient had ado-trastuzumab emtansine (KADCYLA) 240 mg in sodium chloride 0.9 % 250 mL chemo infusion, 3 mg/kg = 240 mg (100 % of original dose 3 mg/kg), Intravenous, Once, 11 of 14 cycles Dose modification: 3 mg/kg (original dose 3 mg/kg, Cycle 1, Reason: Provider Judgment), 3 mg/kg (original dose 3 mg/kg, Cycle 2, Reason: Provider Judgment), 2.4 mg/kg (original dose 3 mg/kg, Cycle 11, Reason: Other (see comments), Comment: bili upper limit of normal), 2.4 mg/kg (original dose 3 mg/kg, Cycle 12, Reason: Other (see comments), Comment: elevated bilirubin) Administration: 240 mg (02/15/2019), 240 mg (03/08/2019), 240 mg (05/10/2019), 240 mg (03/29/2019), 240 mg (04/19/2019), 240 mg (06/01/2019), 240 mg (06/22/2019), 240  mg (07/20/2019), 240 mg (08/10/2019), 240 mg (09/01/2019), 200 mg (09/22/2019)  for chemotherapy treatment.       CANCER STAGING: Cancer Staging Malignant neoplasm of upper-outer quadrant of right breast in female, estrogen receptor negative ([place]) Staging form: Breast,  AJCC 8th Edition - Clinical: Stage IIB (cT3, cN0, cM0, G2, ER-, PR-, HER2+) - Signed by [name], MD on 07/15/2018    INTERVAL HISTORY:  Ms. Para 85 y.o. female seen for follow-up of metastatic breast cancer.  She is accompanied by her husband.  Appetite is 100%.  Energy levels are 25%.  No new pains reported.  Reports mild itching on both legs.  She does not report any pain in the right upper quadrant.  No nausea, vomiting, diarrhea.  She has some tiredness after each treatment lasting few days.  Denies any fevers or chills.   REVIEW OF SYSTEMS:  Review of Systems  Constitutional: Positive for fatigue.  All other systems reviewed and are negative.    PAST MEDICAL/SURGICAL HISTORY:  Past Medical History:  Diagnosis Date  . Cancer ([place]) 06/2018   right breast cancer  . GERD (gastroesophageal reflux disease)    occasional   . Hyperlipidemia   . Hypertension    clearance with note Dr Nevada Crane on chart  . Pneumonia    2 years ago/ states occ cough with sinus drainage- no fever  . Thyroid nodule    with biopsy- states following medically   Past Surgical History:  Procedure Laterality Date  . CATARACT EXTRACTION W/PHACO  11/30/2012   Procedure: CATARACT EXTRACTION PHACO AND INTRAOCULAR LENS PLACEMENT (IOC);  Surgeon: Williams Che, MD;  Location: [facility];  Service: Ophthalmology;  Laterality: Left;  CDE:11.49  . CATARACT EXTRACTION W/PHACO Right 03/15/2013   Procedure: CATARACT EXTRACTION PHACO AND INTRAOCULAR LENS PLACEMENT (IOC);  Surgeon: Williams Che, MD;  Location: [facility];  Service: Ophthalmology;  Laterality: Right;  CDE 16.15  . COLONOSCOPY    . EXCISION OF SKIN TAG Right 06/17/2016   Procedure: SHAVE OF SKIN TAG RIGHT BUTTOCK;  Surgeon: Aviva Signs, MD;  Location: [facility];  Service: General;  Laterality: Right;  . JOINT REPLACEMENT     left knee  . MASS EXCISION Left 06/17/2016   Procedure: EXCISION SKIN MALIGNANT LESION LEFT BUTTOCK;  Surgeon: Aviva Signs, MD;   Location: [facility];  Service: General;  Laterality: Left;  . PORTACATH PLACEMENT Right 07/28/2018   Procedure: INSERTION PORT-A-CATH WITH ULTRASOUND;  Surgeon: Rolm Bookbinder, MD;  Location: [facility];  Service: General;  Laterality: Right;  . PUNCH BIOPSY OF SKIN Right 07/28/2018   Procedure: PUNCH BIOPSY OF SKIN RIGHT BREAST;  Surgeon: Rolm Bookbinder, MD;  Location: [facility];  Service: General;  Laterality: Right;  . TOTAL KNEE ARTHROPLASTY  12/16/2011   Procedure: TOTAL KNEE ARTHROPLASTY;  Surgeon: Gearlean Alf, MD;  Location: [facility];  Service: Orthopedics;  Laterality: Right;  . TUBAL LIGATION       SOCIAL HISTORY:  Social History   Socioeconomic History  . Marital status: Married    Spouse name: Not on file  . Number of children: 2  . Years of education: some business school after HS  . Highest education level: Not on file  Occupational History  . Occupation: Cabin crew  . Occupation: Network engineer   Tobacco Use  . Smoking status: Never Smoker  . Smokeless tobacco: Never Used  Substance and Sexual Activity  . Alcohol use: No  . Drug use: No  .  Sexual activity: Yes    Birth control/protection: None  Other Topics Concern  . Not on file  Social History Narrative   Lives at home with her husband.   4 cups caffeine per day.   Right-handed.   Social Determinants of Health   Financial Resource Strain:   . Difficulty of Paying Living Expenses: Not on file  Food Insecurity:   . Worried About Charity fundraiser in the Last Year: Not on file  . Ran Out of Food in the Last Year: Not on file  Transportation Needs:   . Lack of Transportation (Medical): Not on file  . Lack of Transportation (Non-Medical): Not on file  Physical Activity:   . Days of Exercise per Week: Not on file  . Minutes of Exercise per Session: Not on file  Stress:   . Feeling of Stress : Not on file  Social Connections:   . Frequency of Communication with Friends and  Family: Not on file  . Frequency of Social Gatherings with Friends and Family: Not on file  . Attends Religious Services: Not on file  . Active Member of Clubs or Organizations: Not on file  . Attends Archivist Meetings: Not on file  . Marital Status: Not on file  Intimate Partner Violence:   . Fear of Current or Ex-Partner: Not on file  . Emotionally Abused: Not on file  . Physically Abused: Not on file  . Sexually Abused: Not on file    FAMILY HISTORY:  Family History  Problem Relation Age of Onset  . Heart attack Mother   . Bronchitis Father   . Diabetes Sister   . Leukemia Brother   . Lung disease Brother   . Lung disease Sister     CURRENT MEDICATIONS:  Outpatient Encounter Medications as of 10/13/2019  Medication Sig  . aspirin 325 MG tablet Take 162.5 mg by mouth daily.   . cetirizine (ZYRTEC) 10 MG tablet Take 10 mg by mouth daily.  . hydrocortisone 1 % ointment Apply 1 application topically 2 (two) times daily.  Marland Kitchen lidocaine-prilocaine (EMLA) cream Apply to port site one hour prior to appointment and cover with plastic wrap.  . Misc. Devices MISC Please provide lymphedema sleeve for right arm  . Multiple Vitamin (MULTIVITAMIN WITH MINERALS) TABS tablet Take 1 tablet by mouth daily.  . Omega-3 Fatty Acids (FISH OIL PO) Take 1 capsule by mouth daily.   Glory Rosebush ULTRA test strip USE 1 STRIP TO CHECK GLUCOSE 2 TO 3 TIMES DAILY  . PACLitaxel (TAXOL IV) Inject into the vein once a week.  . Trastuzumab (HERCEPTIN IV) Inject into the vein once a week.   Marland Kitchen acetaminophen (TYLENOL) 325 MG tablet Take 650 mg by mouth every 6 (six) hours as needed.   No facility-administered encounter medications on file as of 10/13/2019.    ALLERGIES:  Allergies  Allergen Reactions  . Penicillins Rash    Has patient had a PCN reaction causing immediate rash, facial/tongue/throat swelling, SOB or lightheadedness with hypotension: No Has patient had a PCN reaction causing severe  rash involving mucus membranes or skin necrosis: No Has patient had a PCN reaction that required hospitalization: No Has patient had a PCN reaction occurring within the last 10 years: No If all of the above answers are "NO", then may proceed with Cephalosporin use.      PHYSICAL EXAM:  ECOG Performance status: 1  Vitals:   10/13/19 0817  BP: 133/68  Pulse: (!) 102  Resp: 18  Temp: (!) 97.5 F (36.4 C)  SpO2: 100%   Filed Weights   10/13/19 0817  Weight: 162 lb 3.2 oz (73.6 kg)    Physical Exam Constitutional:      Appearance: Normal appearance. She is normal weight.  Cardiovascular:     Rate and Rhythm: Normal rate and regular rhythm.     Heart sounds: Normal heart sounds.  Pulmonary:     Effort: Pulmonary effort is normal.     Breath sounds: Normal breath sounds.  Abdominal:     General: Bowel sounds are normal.     Palpations: Abdomen is soft.  Musculoskeletal:        General: Normal range of motion.  Skin:    General: Skin is warm and dry.  Neurological:     Mental Status: She is alert and oriented to person, place, and time. Mental status is at baseline.  Psychiatric:        Mood and Affect: Mood normal.        Behavior: Behavior normal.        Thought Content: Thought content normal.        Judgment: Judgment normal.    Right breast mass is stable.  No erythema over the breast mass or posterior chest wall.  LABORATORY DATA:  I have reviewed the labs as listed.  CBC    Component Value Date/Time   WBC 9.7 10/13/2019 0753   RBC 3.93 10/13/2019 0753   HGB 11.3 (L) 10/13/2019 0753   HGB 12.7 11/24/2017 1626   HCT 34.4 (L) 10/13/2019 0753   HCT 36.6 11/24/2017 1626   PLT 271 10/13/2019 0753   PLT 420 (H) 11/24/2017 1626   MCV 87.5 10/13/2019 0753   MCV 88 11/24/2017 1626   MCH 28.8 10/13/2019 0753   MCHC 32.8 10/13/2019 0753   RDW 18.9 (H) 10/13/2019 0753   RDW 15.4 11/24/2017 1626   LYMPHSABS 3.8 10/13/2019 0753   LYMPHSABS 2.5 11/24/2017 1626     MONOABS 1.0 10/13/2019 0753   EOSABS 0.8 (H) 10/13/2019 0753   EOSABS 0.1 11/24/2017 1626   BASOSABS 0.1 10/13/2019 0753   BASOSABS 0.0 11/24/2017 1626   CMP Latest Ref Rng & Units 10/13/2019 09/22/2019 09/01/2019  Glucose 70 - 99 mg/dL 129(H) 124(H) 126(H)  BUN 8 - 23 mg/dL 14 15 18   Creatinine 0.44 - 1.00 mg/dL 1.27(H) 1.16(H) 1.17(H)  Sodium 135 - 145 mmol/L 137 137 138  Potassium 3.5 - 5.1 mmol/L 3.3(L) 3.0(L) 3.3(L)  Chloride 98 - 111 mmol/L 104 105 107  CO2 22 - 32 mmol/L 20(L) 19(L) 19(L)  Calcium 8.9 - 10.3 mg/dL 9.5 8.9 9.5  Total Protein 6.5 - 8.1 g/dL 7.2 6.7 7.2  Total Bilirubin 0.3 - 1.2 mg/dL 1.5(H) 1.2 1.1  Alkaline Phos 38 - 126 U/L 130(H) 136(H) 139(H)  AST 15 - 41 U/L 99(H) 76(H) 77(H)  ALT 0 - 44 U/L 41 33 35       DIAGNOSTIC IMAGING:  I have independently reviewed the scans and discussed with the patient.    ASSESSMENT & PLAN:   Malignant neoplasm of upper-outer quadrant of right breast in female, estrogen receptor negative ([place]) 1.  Metastatic right breast cancer to the skin, ER/PR negative, HER-2 positive: -Initial presentation with skin involvement of the right breast, axillary region and posterior chest wall. -Skin punch biopsy on 07/28/2018 in the right breast positive for DLI. -12 cycles of weekly Taxol and Herceptin from 07/31/2018-10/30/2018. -PET/CT on 02/05/2019 showing local  progression of disease with enlarging and increasing hypermetabolic right breast mass, multiple tiny 2 to 4 mm pulmonary nodule stable.  No definite sign of metastatic disease elsewhere. -10 cycles of Kadcyla from 02/15/2019 through 09/01/2019. -PET scan after 3 cycles on 04/15/2019 showed persistent and intense focal right breast hypermetabolic some, slightly increased with no evidence of metastatic disease. -I reviewed the films and results of the PET CT scan dated 09/20/2019.  There was a suggestion of progression on the PET CT scan. It appeared stable when I saw the scans. I  have also talked to another radiologist Dr. Earma Reading who agreed with my assessment. -Physical exam showed palpable mass in the right breast which is stable.  No rash on the breast or posterior chest wall. -Her total bilirubin at last visit was 1.2.  Hence we cut back on Kadcyla to 2.4 mg dose. -She did not have any major problems with the last treatment. -I have reviewed her labs.  Her bilirubin is 1.5.  I will hold her treatment today.  We will reevaluate her in 2 weeks.  We will restart her Kadcyla if her bilirubin comes back to normal range.  We will continue at the 2.4 mg per metered squared dose level.  2.  High risk drug monitoring: -Last echo on 09/13/2019 reviewed by me shows EF 65-70%.  Left ventricle is hyperdynamic.  3.  Hypokalemia: -Potassium is 3.3 today.  If it continues to be low, will add home potassium.  Total time spent is 25 minutes with more than 50% of the time spent face-to-face discussing  treatment plan, counseling and coordination of care.    Orders placed this encounter:  Orders Placed This Encounter  Procedures  . CBC with Differential/Platelet  . Comprehensive metabolic panel      Derek Jack, MD [place] [phone]

## 2019-10-13 NOTE — Progress Notes (Signed)
Patient presents today for follow up visit with Dr. [name] and treatment. Labs pending. HR elevated on arrival. Recheck 98. Patient has no complaints of any pain today or changes since the last visit. Vital signs within parameters for treatment.   Message received from [facility] LPN. Hold treatment today due to total bilirubin.Patient to return in two weeks.   No complaints at this time. Discharged from clinic ambulatory. F/U with [facility] as scheduled.

## 2019-10-13 NOTE — Patient Instructions (Addendum)
[place] at [facility] Discharge Instructions  You were seen today by Dr. [name]. He went over your recent lab results. He will hold your treatment today due to your elevated bilirubin. He will see you back in 2 weeks for labs, treatment and follow up.   Thank you for choosing [place] at [facility] to provide your oncology and hematology care.  To afford each patient quality time with our provider, please arrive at least 15 minutes before your scheduled appointment time.   If you have a lab appointment with the [facility] please come in thru the  Main Entrance and check in at the main information desk  You need to re-schedule your appointment should you arrive 10 or more minutes late.  We strive to give you quality time with our providers, and arriving late affects you and other patients whose appointments are after yours.  Also, if you no show three or more times for appointments you may be dismissed from the clinic at the providers discretion.     Again, thank you for choosing [facility].  Our hope is that these requests will decrease the amount of time that you wait before being seen by our physicians.       _____________________________________________________________  Should you have questions after your visit to [facility], please contact our office at [phone] between the hours of 8:00 a.m. and 4:30 p.m.  Voicemails left after 4:00 p.m. will not be returned until the following business day.  For prescription refill requests, have your pharmacy contact our office and allow 72 hours.    Cancer Center Support Programs:   > Cancer Support Group  2nd Tuesday of the month 1pm-2pm, Journey Room

## 2019-10-20 ENCOUNTER — Encounter (HOSPITAL_COMMUNITY): Payer: Self-pay | Admitting: Hematology

## 2019-10-20 NOTE — Assessment & Plan Note (Signed)
1.  Metastatic right breast cancer to the skin, ER/PR negative, HER-2 positive: -Initial presentation with skin involvement of the right breast, axillary region and posterior chest wall. -Skin punch biopsy on 07/28/2018 in the right breast positive for DLI. -12 cycles of weekly Taxol and Herceptin from 07/31/2018-10/30/2018. -PET/CT on 02/05/2019 showing local progression of disease with enlarging and increasing hypermetabolic right breast mass, multiple tiny 2 to 4 mm pulmonary nodule stable.  No definite sign of metastatic disease elsewhere. -10 cycles of Kadcyla from 02/15/2019 through 09/01/2019. -PET scan after 3 cycles on 04/15/2019 showed persistent and intense focal right breast hypermetabolic some, slightly increased with no evidence of metastatic disease. -I reviewed the films and results of the PET CT scan dated 09/20/2019.  There was a suggestion of progression on the PET CT scan. It appeared stable when I saw the scans. I have also talked to another radiologist Dr. Talbert who agreed with my assessment. -Physical exam showed palpable mass in the right breast which is stable.  No rash on the breast or posterior chest wall. -Her total bilirubin at last visit was 1.2.  Hence we cut back on Kadcyla to 2.4 mg dose. -She did not have any major problems with the last treatment. -I have reviewed her labs.  Her bilirubin is 1.5.  I will hold her treatment today.  We will reevaluate her in 2 weeks.  We will restart her Kadcyla if her bilirubin comes back to normal range.  We will continue at the 2.4 mg per metered squared dose level.  2.  High risk drug monitoring: -Last echo on 09/13/2019 reviewed by me shows EF 65-70%.  Left ventricle is hyperdynamic.  3.  Hypokalemia: -Potassium is 3.3 today.  If it continues to be low, will add home potassium. 

## 2019-10-25 DIAGNOSIS — E559 Vitamin D deficiency, unspecified: Secondary | ICD-10-CM | POA: Diagnosis not present

## 2019-10-25 DIAGNOSIS — L539 Erythematous condition, unspecified: Secondary | ICD-10-CM | POA: Diagnosis not present

## 2019-10-25 DIAGNOSIS — R2243 Localized swelling, mass and lump, lower limb, bilateral: Secondary | ICD-10-CM | POA: Diagnosis not present

## 2019-10-25 DIAGNOSIS — Z6828 Body mass index (BMI) 28.0-28.9, adult: Secondary | ICD-10-CM | POA: Diagnosis not present

## 2019-10-25 DIAGNOSIS — I129 Hypertensive chronic kidney disease with stage 1 through stage 4 chronic kidney disease, or unspecified chronic kidney disease: Secondary | ICD-10-CM | POA: Diagnosis not present

## 2019-10-25 DIAGNOSIS — R2240 Localized swelling, mass and lump, unspecified lower limb: Secondary | ICD-10-CM | POA: Diagnosis not present

## 2019-10-25 DIAGNOSIS — N182 Chronic kidney disease, stage 2 (mild): Secondary | ICD-10-CM | POA: Diagnosis not present

## 2019-10-25 DIAGNOSIS — E7801 Familial hypercholesterolemia: Secondary | ICD-10-CM | POA: Diagnosis not present

## 2019-10-25 DIAGNOSIS — E1121 Type 2 diabetes mellitus with diabetic nephropathy: Secondary | ICD-10-CM | POA: Diagnosis not present

## 2019-10-25 DIAGNOSIS — S31829A Unspecified open wound of left buttock, initial encounter: Secondary | ICD-10-CM | POA: Diagnosis not present

## 2019-10-27 ENCOUNTER — Inpatient Hospital Stay (HOSPITAL_COMMUNITY): Payer: Medicare HMO

## 2019-10-27 ENCOUNTER — Inpatient Hospital Stay (HOSPITAL_BASED_OUTPATIENT_CLINIC_OR_DEPARTMENT_OTHER): Payer: Medicare HMO | Admitting: Nurse Practitioner

## 2019-10-27 ENCOUNTER — Other Ambulatory Visit: Payer: Self-pay

## 2019-10-27 VITALS — BP 137/67 | HR 84 | Temp 97.5°F | Resp 18

## 2019-10-27 DIAGNOSIS — C50411 Malignant neoplasm of upper-outer quadrant of right female breast: Secondary | ICD-10-CM

## 2019-10-27 DIAGNOSIS — E876 Hypokalemia: Secondary | ICD-10-CM | POA: Diagnosis not present

## 2019-10-27 DIAGNOSIS — Z171 Estrogen receptor negative status [ER-]: Secondary | ICD-10-CM

## 2019-10-27 DIAGNOSIS — R21 Rash and other nonspecific skin eruption: Secondary | ICD-10-CM | POA: Diagnosis not present

## 2019-10-27 DIAGNOSIS — Z79899 Other long term (current) drug therapy: Secondary | ICD-10-CM | POA: Diagnosis not present

## 2019-10-27 DIAGNOSIS — R911 Solitary pulmonary nodule: Secondary | ICD-10-CM | POA: Diagnosis not present

## 2019-10-27 DIAGNOSIS — Z7982 Long term (current) use of aspirin: Secondary | ICD-10-CM | POA: Diagnosis not present

## 2019-10-27 LAB — CBC WITH DIFFERENTIAL/PLATELET
Abs Immature Granulocytes: 0.02 10*3/uL (ref 0.00–0.07)
Basophils Absolute: 0.1 10*3/uL (ref 0.0–0.1)
Basophils Relative: 1 %
Eosinophils Absolute: 0.6 10*3/uL — ABNORMAL HIGH (ref 0.0–0.5)
Eosinophils Relative: 7 %
HCT: 32.1 % — ABNORMAL LOW (ref 36.0–46.0)
Hemoglobin: 10.6 g/dL — ABNORMAL LOW (ref 12.0–15.0)
Immature Granulocytes: 0 %
Lymphocytes Relative: 40 %
Lymphs Abs: 3.5 10*3/uL (ref 0.7–4.0)
MCH: 29.1 pg (ref 26.0–34.0)
MCHC: 33 g/dL (ref 30.0–36.0)
MCV: 88.2 fL (ref 80.0–100.0)
Monocytes Absolute: 1.2 10*3/uL — ABNORMAL HIGH (ref 0.1–1.0)
Monocytes Relative: 13 %
Neutro Abs: 3.5 10*3/uL (ref 1.7–7.7)
Neutrophils Relative %: 39 %
Platelets: 224 10*3/uL (ref 150–400)
RBC: 3.64 MIL/uL — ABNORMAL LOW (ref 3.87–5.11)
RDW: 19.9 % — ABNORMAL HIGH (ref 11.5–15.5)
WBC: 8.9 10*3/uL (ref 4.0–10.5)
nRBC: 0 % (ref 0.0–0.2)

## 2019-10-27 LAB — COMPREHENSIVE METABOLIC PANEL
ALT: 38 U/L (ref 0–44)
AST: 94 U/L — ABNORMAL HIGH (ref 15–41)
Albumin: 2.7 g/dL — ABNORMAL LOW (ref 3.5–5.0)
Alkaline Phosphatase: 120 U/L (ref 38–126)
Anion gap: 13 (ref 5–15)
BUN: 20 mg/dL (ref 8–23)
CO2: 19 mmol/L — ABNORMAL LOW (ref 22–32)
Calcium: 9.1 mg/dL (ref 8.9–10.3)
Chloride: 105 mmol/L (ref 98–111)
Creatinine, Ser: 1.29 mg/dL — ABNORMAL HIGH (ref 0.44–1.00)
GFR calc Af Amer: 43 mL/min — ABNORMAL LOW (ref >60)
GFR calc non Af Amer: 37 mL/min — ABNORMAL LOW (ref >60)
Glucose, Bld: 118 mg/dL — ABNORMAL HIGH (ref 70–99)
Potassium: 3.2 mmol/L — ABNORMAL LOW (ref 3.5–5.1)
Sodium: 137 mmol/L (ref 135–145)
Total Bilirubin: 1.4 mg/dL — ABNORMAL HIGH (ref 0.3–1.2)
Total Protein: 7.1 g/dL (ref 6.5–8.1)

## 2019-10-27 MED ORDER — SODIUM CHLORIDE 0.9% FLUSH
10.0000 mL | Freq: Once | INTRAVENOUS | Status: AC
  Administered 2019-10-27: 12:00:00 10 mL

## 2019-10-27 MED ORDER — HEPARIN SOD (PORK) LOCK FLUSH 100 UNIT/ML IV SOLN
500.0000 [IU] | Freq: Once | INTRAVENOUS | Status: AC
  Administered 2019-10-27: 12:00:00 500 [IU] via INTRAVENOUS

## 2019-10-27 MED ORDER — SODIUM CHLORIDE 0.9 % IV SOLN
Freq: Once | INTRAVENOUS | Status: AC
  Filled 2019-10-27: qty 1000

## 2019-10-27 NOTE — Progress Notes (Signed)
[facility], [place] [id number]   CLINIC:  Medical Oncology/Hematology  PCP:  [name], MD [address] [place] [address] [phone]   REASON FOR VISIT: Follow-up for breast cancer  CURRENT THERAPY: Kadcyla on hold  BRIEF ONCOLOGIC HISTORY:  Oncology History  Malignant neoplasm of upper-outer quadrant of right breast in female, estrogen receptor negative ([place])  07/03/2018 Initial Diagnosis   Palpable right breast mass for several months with skin discoloration, clinically skin trabecular thickening with nipple retraction measuring 7.2 x 4.3 x 4.2 cm, additional mass 4 o'clock position 2.6 cm, right axillary tail 1.2 cm right axillary lymph node 1.8 cm left breast irregular mass 1 cm, adjacent mass 0.9 cm together measuring 1.9 cm, no lymphadenopathy   07/03/2018 Pathology Results   Right breast biopsy: IDC grade 2, ER 0%, PR 0%, HER-2 positive, Ki-67 40%, lymph node biopsy negative, left breast biopsy complex sclerosing lesion    07/15/2018 Cancer Staging   Staging form: Breast, AJCC 8th Edition - Clinical: Stage IIB (cT3, cN0, cM0, G2, ER-, PR-, HER2+) - Signed by [name], MD on 07/15/2018   07/31/2018 - 10/30/2018 Chemotherapy   The patient had trastuzumab (HERCEPTIN) 336 mg in sodium chloride 0.9 % 250 mL chemo infusion, 4 mg/kg = 336 mg, Intravenous,  Once, 4 of 4 cycles Administration: 336 mg (07/31/2018), 168 mg (08/07/2018), 168 mg (08/28/2018), 168 mg (08/14/2018), 168 mg (08/21/2018), 168 mg (09/04/2018), 168 mg (09/11/2018), 168 mg (09/18/2018), 168 mg (10/02/2018), 168 mg (10/09/2018), 168 mg (10/16/2018), 168 mg (10/23/2018), 168 mg (10/30/2018) PACLitaxel (TAXOL) 78 mg in sodium chloride 0.9 % 250 mL chemo infusion (</= 35m/m2), 40 mg/m2 = 78 mg (50 % of original dose 80 mg/m2), Intravenous,  Once, 4 of 4 cycles Dose modification: 40 mg/m2 (50 % of original dose 80 mg/m2, Cycle 1, Reason: Patient Age), 53.3333 mg/m2 (66.7 % of  original dose 80 mg/m2, Cycle 1, Reason: Provider Judgment), 45 mg/m2 (original dose 80 mg/m2, Cycle 3, Reason: Provider Judgment) Administration: 78 mg (07/31/2018), 102 mg (08/07/2018), 102 mg (08/28/2018), 102 mg (08/14/2018), 102 mg (08/21/2018), 102 mg (09/04/2018), 102 mg (09/18/2018), 84 mg (10/02/2018), 84 mg (10/09/2018), 84 mg (10/16/2018), 84 mg (10/23/2018), 84 mg (10/30/2018)  for chemotherapy treatment.    11/13/2018 - 02/14/2019 Chemotherapy   The patient had trastuzumab (HERCEPTIN) 504 mg in sodium chloride 0.9 % 250 mL chemo infusion, 6 mg/kg = 504 mg (100 % of original dose 6 mg/kg), Intravenous,  Once, 4 of 5 cycles Dose modification: 6 mg/kg (original dose 6 mg/kg, Cycle 1, Reason: Other (see comments), Comment: pt receving it ) Administration: 504 mg (11/13/2018), 504 mg (12/04/2018), 504 mg (01/04/2019), 504 mg (01/25/2019)  for chemotherapy treatment.    02/15/2019 -  Chemotherapy   The patient had ado-trastuzumab emtansine (KADCYLA) 240 mg in sodium chloride 0.9 % 250 mL chemo infusion, 3 mg/kg = 240 mg (100 % of original dose 3 mg/kg), Intravenous, Once, 11 of 14 cycles Dose modification: 3 mg/kg (original dose 3 mg/kg, Cycle 1, Reason: Provider Judgment), 3 mg/kg (original dose 3 mg/kg, Cycle 2, Reason: Provider Judgment), 2.4 mg/kg (original dose 3 mg/kg, Cycle 11, Reason: Other (see comments), Comment: bili upper limit of normal), 2.4 mg/kg (original dose 3 mg/kg, Cycle 12, Reason: Other (see comments), Comment: elevated bilirubin) Administration: 240 mg (02/15/2019), 240 mg (03/08/2019), 240 mg (05/10/2019), 240 mg (03/29/2019), 240 mg (04/19/2019), 240 mg (06/01/2019), 240 mg (06/22/2019), 240 mg (07/20/2019), 240 mg (  08/10/2019), 240 mg (09/01/2019), 200 mg (09/22/2019)  for chemotherapy treatment.       CANCER STAGING: Cancer Staging Malignant neoplasm of upper-outer quadrant of right breast in female, estrogen receptor negative ([place]) Staging form: Breast, AJCC 8th Edition - Clinical:  Stage IIB (cT3, cN0, cM0, G2, ER-, PR-, HER2+) - Signed by [name], MD on 07/15/2018    INTERVAL HISTORY:  Ms. Lumadue 85 y.o. female returns for routine follow-up for breast cancer.  Patient reports she has been doing well since her last visit.  She denies any new lumps or bumps.  She reports she still has a mild rash on her back that itches mostly in the evenings.  She has been putting hydrocortisone cream on it which is helping some. Denies any nausea, vomiting, or diarrhea. Denies any new pains. Had not noticed any recent bleeding such as epistaxis, hematuria or hematochezia. Denies recent chest pain on exertion, shortness of breath on minimal exertion, pre-syncopal episodes, or palpitations. Denies any numbness or tingling in hands or feet. Denies any recent fevers, infections, or recent hospitalizations. Patient reports appetite at 100% and energy level at 50%.  She is eating well maintaining her weight at this time.    REVIEW OF SYSTEMS:  Review of Systems  Skin: Positive for itching and rash.  All other systems reviewed and are negative.    PAST MEDICAL/SURGICAL HISTORY:  Past Medical History:  Diagnosis Date  . Cancer ([facility]) 06/2018   right breast cancer  . GERD (gastroesophageal reflux disease)    occasional   . Hyperlipidemia   . Hypertension    clearance with note Dr Nevada Crane on chart  . Pneumonia    2 years ago/ states occ cough with sinus drainage- no fever  . Thyroid nodule    with biopsy- states following medically   Past Surgical History:  Procedure Laterality Date  . CATARACT EXTRACTION W/PHACO  11/30/2012   Procedure: CATARACT EXTRACTION PHACO AND INTRAOCULAR LENS PLACEMENT (IOC);  Surgeon: Williams Che, MD;  Location: [facility];  Service: Ophthalmology;  Laterality: Left;  CDE:11.49  . CATARACT EXTRACTION W/PHACO Right 03/15/2013   Procedure: CATARACT EXTRACTION PHACO AND INTRAOCULAR LENS PLACEMENT (IOC);  Surgeon: Williams Che, MD;  Location: [facility];  Service:  Ophthalmology;  Laterality: Right;  CDE 16.15  . COLONOSCOPY    . EXCISION OF SKIN TAG Right 06/17/2016   Procedure: SHAVE OF SKIN TAG RIGHT BUTTOCK;  Surgeon: Aviva Signs, MD;  Location: [facility];  Service: General;  Laterality: Right;  . JOINT REPLACEMENT     left knee  . MASS EXCISION Left 06/17/2016   Procedure: EXCISION SKIN MALIGNANT LESION LEFT BUTTOCK;  Surgeon: Aviva Signs, MD;  Location: [facility];  Service: General;  Laterality: Left;  . PORTACATH PLACEMENT Right 07/28/2018   Procedure: INSERTION PORT-A-CATH WITH ULTRASOUND;  Surgeon: Rolm Bookbinder, MD;  Location: [facility];  Service: General;  Laterality: Right;  . PUNCH BIOPSY OF SKIN Right 07/28/2018   Procedure: PUNCH BIOPSY OF SKIN RIGHT BREAST;  Surgeon: Rolm Bookbinder, MD;  Location: [facility];  Service: General;  Laterality: Right;  . TOTAL KNEE ARTHROPLASTY  12/16/2011   Procedure: TOTAL KNEE ARTHROPLASTY;  Surgeon: Gearlean Alf, MD;  Location: [facility];  Service: Orthopedics;  Laterality: Right;  . TUBAL LIGATION       SOCIAL HISTORY:  Social History   Socioeconomic History  . Marital status: Married    Spouse name: Not on file  . Number of  children: 2  . Years of education: some business school after HS  . Highest education level: Not on file  Occupational History  . Occupation: Cabin crew  . Occupation: Network engineer   Tobacco Use  . Smoking status: Never Smoker  . Smokeless tobacco: Never Used  Substance and Sexual Activity  . Alcohol use: No  . Drug use: No  . Sexual activity: Yes    Birth control/protection: None  Other Topics Concern  . Not on file  Social History Narrative   Lives at home with her husband.   4 cups caffeine per day.   Right-handed.   Social Determinants of Health   Financial Resource Strain:   . Difficulty of Paying Living Expenses: Not on file  Food Insecurity:   . Worried About Charity fundraiser in the Last Year: Not on file  . Ran Out of  Food in the Last Year: Not on file  Transportation Needs:   . Lack of Transportation (Medical): Not on file  . Lack of Transportation (Non-Medical): Not on file  Physical Activity:   . Days of Exercise per Week: Not on file  . Minutes of Exercise per Session: Not on file  Stress:   . Feeling of Stress : Not on file  Social Connections:   . Frequency of Communication with Friends and Family: Not on file  . Frequency of Social Gatherings with Friends and Family: Not on file  . Attends Religious Services: Not on file  . Active Member of Clubs or Organizations: Not on file  . Attends Archivist Meetings: Not on file  . Marital Status: Not on file  Intimate Partner Violence:   . Fear of Current or Ex-Partner: Not on file  . Emotionally Abused: Not on file  . Physically Abused: Not on file  . Sexually Abused: Not on file    FAMILY HISTORY:  Family History  Problem Relation Age of Onset  . Heart attack Mother   . Bronchitis Father   . Diabetes Sister   . Leukemia Brother   . Lung disease Brother   . Lung disease Sister     CURRENT MEDICATIONS:  Outpatient Encounter Medications as of 10/27/2019  Medication Sig  . aspirin 325 MG tablet Take 162.5 mg by mouth daily.   . cetirizine (ZYRTEC) 10 MG tablet Take 10 mg by mouth daily.  . hydrocortisone 1 % ointment Apply 1 application topically 2 (two) times daily.  Marland Kitchen lidocaine-prilocaine (EMLA) cream Apply to port site one hour prior to appointment and cover with plastic wrap.  . Misc. Devices MISC Please provide lymphedema sleeve for right arm  . Multiple Vitamin (MULTIVITAMIN WITH MINERALS) TABS tablet Take 1 tablet by mouth daily.  . Omega-3 Fatty Acids (FISH OIL PO) Take 1 capsule by mouth daily.   Glory Rosebush ULTRA test strip USE 1 STRIP TO CHECK GLUCOSE 2 TO 3 TIMES DAILY  . PACLitaxel (TAXOL IV) Inject into the vein once a week.  . Trastuzumab (HERCEPTIN IV) Inject into the vein once a week.   Marland Kitchen acetaminophen (TYLENOL)  325 MG tablet Take 650 mg by mouth every 6 (six) hours as needed.   No facility-administered encounter medications on file as of 10/27/2019.    ALLERGIES:  Allergies  Allergen Reactions  . Penicillins Rash    Has patient had a PCN reaction causing immediate rash, facial/tongue/throat swelling, SOB or lightheadedness with hypotension: No Has patient had a PCN reaction causing severe rash involving mucus membranes or skin  necrosis: No Has patient had a PCN reaction that required hospitalization: No Has patient had a PCN reaction occurring within the last 10 years: No If all of the above answers are "NO", then may proceed with Cephalosporin use.      PHYSICAL EXAM:  ECOG Performance status: 1  Vitals:   10/27/19 0811  BP: (!) 157/80  Pulse: (!) 108  Resp: 18  Temp: (!) 97.1 F (36.2 C)  SpO2: 100%   Filed Weights   10/27/19 0811  Weight: 165 lb (74.8 kg)    Physical Exam Constitutional:      Appearance: Normal appearance. She is normal weight.  Cardiovascular:     Rate and Rhythm: Normal rate and regular rhythm.     Heart sounds: Normal heart sounds.  Pulmonary:     Effort: Pulmonary effort is normal.     Breath sounds: Normal breath sounds.  Abdominal:     General: Bowel sounds are normal.     Palpations: Abdomen is soft.  Musculoskeletal:        General: Normal range of motion.  Skin:    General: Skin is warm.  Neurological:     Mental Status: She is alert and oriented to person, place, and time. Mental status is at baseline.  Psychiatric:        Mood and Affect: Mood normal.        Behavior: Behavior normal.        Thought Content: Thought content normal.        Judgment: Judgment normal.      LABORATORY DATA:  I have reviewed the labs as listed.  CBC    Component Value Date/Time   WBC 8.9 10/27/2019 0818   RBC 3.64 (L) 10/27/2019 0818   HGB 10.6 (L) 10/27/2019 0818   HGB 12.7 11/24/2017 1626   HCT 32.1 (L) 10/27/2019 0818   HCT 36.6 11/24/2017  1626   PLT 224 10/27/2019 0818   PLT 420 (H) 11/24/2017 1626   MCV 88.2 10/27/2019 0818   MCV 88 11/24/2017 1626   MCH 29.1 10/27/2019 0818   MCHC 33.0 10/27/2019 0818   RDW 19.9 (H) 10/27/2019 0818   RDW 15.4 11/24/2017 1626   LYMPHSABS 3.5 10/27/2019 0818   LYMPHSABS 2.5 11/24/2017 1626   MONOABS 1.2 (H) 10/27/2019 0818   EOSABS 0.6 (H) 10/27/2019 0818   EOSABS 0.1 11/24/2017 1626   BASOSABS 0.1 10/27/2019 0818   BASOSABS 0.0 11/24/2017 1626   CMP Latest Ref Rng & Units 10/27/2019 10/13/2019 09/22/2019  Glucose 70 - 99 mg/dL 118(H) 129(H) 124(H)  BUN 8 - 23 mg/dL 20 14 15   Creatinine 0.44 - 1.00 mg/dL 1.29(H) 1.27(H) 1.16(H)  Sodium 135 - 145 mmol/L 137 137 137  Potassium 3.5 - 5.1 mmol/L 3.2(L) 3.3(L) 3.0(L)  Chloride 98 - 111 mmol/L 105 104 105  CO2 22 - 32 mmol/L 19(L) 20(L) 19(L)  Calcium 8.9 - 10.3 mg/dL 9.1 9.5 8.9  Total Protein 6.5 - 8.1 g/dL 7.1 7.2 6.7  Total Bilirubin 0.3 - 1.2 mg/dL 1.4(H) 1.5(H) 1.2  Alkaline Phos 38 - 126 U/L 120 130(H) 136(H)  AST 15 - 41 U/L 94(H) 99(H) 76(H)  ALT 0 - 44 U/L 38 41 33    I personally performed a face-to-face visit.  All questions were answered to patient's stated satisfaction. Encouraged patient to call with any new concerns or questions before his next visit to the cancer center and we can certain see him sooner, if needed.  ASSESSMENT & PLAN:   Malignant neoplasm of upper-outer quadrant of right breast in female, estrogen receptor negative ([place]) 1.  Metastatic right breast cancer to the skin, ER/PR negative, HER-2 positive: -Initial presentation with skin involvement of the right breast, axillary region and posterior chest wall. -Skin punch biopsy on 07/28/2018 in the right breast positive for DLI. -12 cycles of weekly Taxol and Herceptin from 07/31/2018-10/30/2018. -PET/CT on 02/05/2019 showing local progression of disease with enlarging and increasing hypermetabolic right breast mass, multiple tiny 2 to 4 mm pulmonary  nodule stable.  No definite sign of metastatic disease elsewhere. -10 cycles of Kadcyla from 02/15/2019 through 09/01/2019. -PET scan after 3 cycles on 04/15/2019 showed persistent and intense focal right breast hypermetabolic some, slightly increased with no evidence of metastatic disease. -PET scan on 09/20/2019 showed suggestion of progression. -Physical examination shows complete resolution of erythema of right breast, right axillary region and posterior chest wall with excellent response.  Patient denies any symptoms of PND or orthopnea. -She is tolerating Kadcyla very well.  Total bilirubin today is 1.2.  The dose of Kadcyla was cut back to 2.4 mg. -Labs today on 10/27/2019 showed her bilirubin 1.4.  We will hold her Kadcyla today.  Her creatinine has increased to 1.29.  We will give 1 L of normal saline with electrolytes. -She will follow-up next week with labs and possible treatment.  2.  High risk drug monitoring: -Echo on 09/13/2019 shows EF more than 65-70%, left ventricle is hyperdynamic. -She will repeat her echo in 3 weeks prior to her next appointment.  3.  Hypokalemia: -Labs on 10/27/2019 showed her potassium 3.2. -We will give her 40 mEq of KCl with her normal saline today. -She was told to start taking potassium daily at home.      Orders placed this encounter:  Orders Placed This Encounter  Procedures  . Lactate dehydrogenase  . Magnesium  . CBC with Differential/Platelet  . Comprehensive metabolic panel      Francene Finders, FNP-C [place] [phone]

## 2019-10-27 NOTE — Assessment & Plan Note (Signed)
1.  Metastatic right breast cancer to the skin, ER/PR negative, HER-2 positive: -Initial presentation with skin involvement of the right breast, axillary region and posterior chest wall. -Skin punch biopsy on 07/28/2018 in the right breast positive for DLI. -12 cycles of weekly Taxol and Herceptin from 07/31/2018-10/30/2018. -PET/CT on 02/05/2019 showing local progression of disease with enlarging and increasing hypermetabolic right breast mass, multiple tiny 2 to 4 mm pulmonary nodule stable.  No definite sign of metastatic disease elsewhere. -10 cycles of Kadcyla from 02/15/2019 through 09/01/2019. -PET scan after 3 cycles on 04/15/2019 showed persistent and intense focal right breast hypermetabolic some, slightly increased with no evidence of metastatic disease. -PET scan on 09/20/2019 showed suggestion of progression. -Physical examination shows complete resolution of erythema of right breast, right axillary region and posterior chest wall with excellent response.  Patient denies any symptoms of PND or orthopnea. -She is tolerating Kadcyla very well.  Total bilirubin today is 1.2.  The dose of Kadcyla was cut back to 2.4 mg. -Labs today on 10/27/2019 showed her bilirubin 1.4.  We will hold her Kadcyla today.  Her creatinine has increased to 1.29.  We will give 1 L of normal saline with electrolytes. -She will follow-up next week with labs and possible treatment.  2.  High risk drug monitoring: -Echo on 09/13/2019 shows EF more than 65-70%, left ventricle is hyperdynamic. -She will repeat her echo in 3 weeks prior to her next appointment.  3.  Hypokalemia: -Labs on 10/27/2019 showed her potassium 3.2. -We will give her 40 mEq of KCl with her normal saline today. -She was told to start taking potassium daily at home.

## 2019-10-27 NOTE — Patient Instructions (Signed)
[facility] at [facility] Discharge Instructions  Follow up next week for labs and possible treatment   Thank you for choosing [place] at [facility] to provide your oncology and hematology care.  To afford each patient quality time with our provider, please arrive at least 15 minutes before your scheduled appointment time.   If you have a lab appointment with the [facility] please come in thru the Main Entrance and check in at the main information desk.  You need to re-schedule your appointment should you arrive 10 or more minutes late.  We strive to give you quality time with our providers, and arriving late affects you and other patients whose appointments are after yours.  Also, if you no show three or more times for appointments you may be dismissed from the clinic at the providers discretion.     Again, thank you for choosing [facility].  Our hope is that these requests will decrease the amount of time that you wait before being seen by our physicians.       _____________________________________________________________  Should you have questions after your visit to [facility], please contact our office at [phone] between the hours of 8:00 a.m. and 4:30 p.m.  Voicemails left after 4:00 p.m. will not be returned until the following business day.  For prescription refill requests, have your pharmacy contact our office and allow 72 hours.    Due to Covid, you will need to wear a mask upon entering the hospital. If you do not have a mask, a mask will be given to you at the Main Entrance upon arrival. For doctor visits, patients may have 1 support person with them. For treatment visits, patients can not have anyone with them due to social distancing guidelines and our immunocompromised population.

## 2019-10-27 NOTE — Progress Notes (Signed)
Patient presents today for treatment and f/u visit with RLockamy NP. Heart rate elevated on arrival. 108.   No treatment today per RLockamy NP. VO received to give 1 liter hydration with potassium and Magnesium. (House Wine). Labs reviewed by [facility] NP. Creatinine 1.29 today. Potassium 3.2.   Hydration  given today per MD orders. Tolerated infusion without adverse affects. Vital signs stable. No complaints at this time. Discharged from clinic ambulatory. F/U with [facility] as scheduled.

## 2019-11-03 ENCOUNTER — Other Ambulatory Visit: Payer: Self-pay

## 2019-11-03 ENCOUNTER — Inpatient Hospital Stay (HOSPITAL_COMMUNITY): Payer: Medicare HMO

## 2019-11-03 ENCOUNTER — Inpatient Hospital Stay (HOSPITAL_COMMUNITY): Payer: Medicare HMO | Attending: Hematology

## 2019-11-03 ENCOUNTER — Inpatient Hospital Stay (HOSPITAL_BASED_OUTPATIENT_CLINIC_OR_DEPARTMENT_OTHER): Payer: Medicare HMO | Admitting: Hematology

## 2019-11-03 VITALS — BP 143/71 | HR 83 | Temp 96.9°F | Resp 18

## 2019-11-03 DIAGNOSIS — I10 Essential (primary) hypertension: Secondary | ICD-10-CM | POA: Insufficient documentation

## 2019-11-03 DIAGNOSIS — R21 Rash and other nonspecific skin eruption: Secondary | ICD-10-CM | POA: Diagnosis not present

## 2019-11-03 DIAGNOSIS — Z79899 Other long term (current) drug therapy: Secondary | ICD-10-CM | POA: Insufficient documentation

## 2019-11-03 DIAGNOSIS — Z171 Estrogen receptor negative status [ER-]: Secondary | ICD-10-CM

## 2019-11-03 DIAGNOSIS — C773 Secondary and unspecified malignant neoplasm of axilla and upper limb lymph nodes: Secondary | ICD-10-CM | POA: Diagnosis not present

## 2019-11-03 DIAGNOSIS — K219 Gastro-esophageal reflux disease without esophagitis: Secondary | ICD-10-CM | POA: Diagnosis not present

## 2019-11-03 DIAGNOSIS — C50411 Malignant neoplasm of upper-outer quadrant of right female breast: Secondary | ICD-10-CM | POA: Diagnosis not present

## 2019-11-03 DIAGNOSIS — E876 Hypokalemia: Secondary | ICD-10-CM | POA: Diagnosis not present

## 2019-11-03 DIAGNOSIS — E785 Hyperlipidemia, unspecified: Secondary | ICD-10-CM | POA: Insufficient documentation

## 2019-11-03 DIAGNOSIS — Z7982 Long term (current) use of aspirin: Secondary | ICD-10-CM | POA: Diagnosis not present

## 2019-11-03 DIAGNOSIS — Z5111 Encounter for antineoplastic chemotherapy: Secondary | ICD-10-CM | POA: Insufficient documentation

## 2019-11-03 DIAGNOSIS — E041 Nontoxic single thyroid nodule: Secondary | ICD-10-CM | POA: Diagnosis not present

## 2019-11-03 LAB — COMPREHENSIVE METABOLIC PANEL
ALT: 30 U/L (ref 0–44)
AST: 66 U/L — ABNORMAL HIGH (ref 15–41)
Albumin: 2.9 g/dL — ABNORMAL LOW (ref 3.5–5.0)
Alkaline Phosphatase: 118 U/L (ref 38–126)
Anion gap: 14 (ref 5–15)
BUN: 18 mg/dL (ref 8–23)
CO2: 19 mmol/L — ABNORMAL LOW (ref 22–32)
Calcium: 9.6 mg/dL (ref 8.9–10.3)
Chloride: 105 mmol/L (ref 98–111)
Creatinine, Ser: 1.48 mg/dL — ABNORMAL HIGH (ref 0.44–1.00)
GFR calc Af Amer: 37 mL/min — ABNORMAL LOW (ref >60)
GFR calc non Af Amer: 32 mL/min — ABNORMAL LOW (ref >60)
Glucose, Bld: 114 mg/dL — ABNORMAL HIGH (ref 70–99)
Potassium: 3.4 mmol/L — ABNORMAL LOW (ref 3.5–5.1)
Sodium: 138 mmol/L (ref 135–145)
Total Bilirubin: 1.2 mg/dL (ref 0.3–1.2)
Total Protein: 7.2 g/dL (ref 6.5–8.1)

## 2019-11-03 LAB — CBC WITH DIFFERENTIAL/PLATELET
Abs Immature Granulocytes: 0.01 10*3/uL (ref 0.00–0.07)
Basophils Absolute: 0.1 10*3/uL (ref 0.0–0.1)
Basophils Relative: 1 %
Eosinophils Absolute: 0.5 10*3/uL (ref 0.0–0.5)
Eosinophils Relative: 6 %
HCT: 33.4 % — ABNORMAL LOW (ref 36.0–46.0)
Hemoglobin: 11 g/dL — ABNORMAL LOW (ref 12.0–15.0)
Immature Granulocytes: 0 %
Lymphocytes Relative: 46 %
Lymphs Abs: 3.7 10*3/uL (ref 0.7–4.0)
MCH: 29 pg (ref 26.0–34.0)
MCHC: 32.9 g/dL (ref 30.0–36.0)
MCV: 88.1 fL (ref 80.0–100.0)
Monocytes Absolute: 1 10*3/uL (ref 0.1–1.0)
Monocytes Relative: 12 %
Neutro Abs: 2.8 10*3/uL (ref 1.7–7.7)
Neutrophils Relative %: 35 %
Platelets: 252 10*3/uL (ref 150–400)
RBC: 3.79 MIL/uL — ABNORMAL LOW (ref 3.87–5.11)
RDW: 19.9 % — ABNORMAL HIGH (ref 11.5–15.5)
WBC: 8 10*3/uL (ref 4.0–10.5)
nRBC: 0 % (ref 0.0–0.2)

## 2019-11-03 LAB — MAGNESIUM: Magnesium: 1.8 mg/dL (ref 1.7–2.4)

## 2019-11-03 LAB — LACTATE DEHYDROGENASE: LDH: 184 U/L (ref 98–192)

## 2019-11-03 MED ORDER — SODIUM CHLORIDE 0.9 % IV SOLN
180.0000 mg | Freq: Once | INTRAVENOUS | Status: AC
  Administered 2019-11-03: 180 mg via INTRAVENOUS
  Filled 2019-11-03: qty 9

## 2019-11-03 MED ORDER — DIPHENHYDRAMINE HCL 25 MG PO CAPS
50.0000 mg | ORAL_CAPSULE | Freq: Once | ORAL | Status: AC
  Administered 2019-11-03: 50 mg via ORAL
  Filled 2019-11-03: qty 2

## 2019-11-03 MED ORDER — SODIUM CHLORIDE 0.9 % IV SOLN: Freq: Once | INTRAVENOUS | Status: AC

## 2019-11-03 MED ORDER — ACETAMINOPHEN 325 MG PO TABS
650.0000 mg | ORAL_TABLET | Freq: Once | ORAL | Status: AC
  Administered 2019-11-03: 650 mg via ORAL
  Filled 2019-11-03: qty 2

## 2019-11-03 MED ORDER — POTASSIUM CHLORIDE ER 10 MEQ PO TBCR: 10.0000 meq | EXTENDED_RELEASE_TABLET | Freq: Every day | ORAL | 3 refills | Status: DC

## 2019-11-03 MED ORDER — SODIUM CHLORIDE 0.9% FLUSH
10.0000 mL | INTRAVENOUS | Status: DC | PRN
  Administered 2019-11-03: 10 mL

## 2019-11-03 MED ORDER — HEPARIN SOD (PORK) LOCK FLUSH 100 UNIT/ML IV SOLN
500.0000 [IU] | Freq: Once | INTRAVENOUS | Status: AC | PRN
  Administered 2019-11-03: 500 [IU]

## 2019-11-03 MED ORDER — SODIUM CHLORIDE 0.9 % IV SOLN
Freq: Once | INTRAVENOUS | Status: AC
  Filled 2019-11-03: qty 1000

## 2019-11-03 NOTE — Patient Instructions (Addendum)
[facility] at [facility] Discharge Instructions  You were seen today by Dr. Katragadda. He went over your recent lab results. He will see you back in 3 weeks for labs, treatment and follow up.   Thank you for choosing [facility] at [facility] to provide your oncology and hematology care.  To afford each patient quality time with our provider, please arrive at least 15 minutes before your scheduled appointment time.   If you have a lab appointment with the Cancer Center please come in thru the  Main Entrance and check in at the main information desk  You need to re-schedule your appointment should you arrive 10 or more minutes late.  We strive to give you quality time with our providers, and arriving late affects you and other patients whose appointments are after yours.  Also, if you no show three or more times for appointments you may be dismissed from the clinic at the providers discretion.     Again, thank you for choosing [facility].  Our hope is that these requests will decrease the amount of time that you wait before being seen by our physicians.       _____________________________________________________________  Should you have questions after your visit to [facility], please contact our office at [phone] between the hours of 8:00 a.m. and 4:30 p.m.  Voicemails left after 4:00 p.m. will not be returned until the following business day.  For prescription refill requests, have your pharmacy contact our office and allow 72 hours.    Cancer Center Support Programs:   > Cancer Support Group  2nd Tuesday of the month 1pm-2pm, Journey Room    

## 2019-11-03 NOTE — Patient Instructions (Signed)
[facility] at [facility] Discharge Instructions  Labs drawn from portacath today   Thank you for choosing [facility] at [facility] to provide your oncology and hematology care.  To afford each patient quality time with our provider, please arrive at least 15 minutes before your scheduled appointment time.   If you have a lab appointment with the Cancer Center please come in thru the Main Entrance and check in at the main information desk.  You need to re-schedule your appointment should you arrive 10 or more minutes late.  We strive to give you quality time with our providers, and arriving late affects you and other patients whose appointments are after yours.  Also, if you no show three or more times for appointments you may be dismissed from the clinic at the providers discretion.     Again, thank you for choosing [facility].  Our hope is that these requests will decrease the amount of time that you wait before being seen by our physicians.       _____________________________________________________________  Should you have questions after your visit to [facility], please contact our office at [phone] between the hours of 8:00 a.m. and 4:30 p.m.  Voicemails left after 4:00 p.m. will not be returned until the following business day.  For prescription refill requests, have your pharmacy contact our office and allow 72 hours.    Due to Covid, you will need to wear a mask upon entering the hospital. If you do not have a mask, a mask will be given to you at the Main Entrance upon arrival. For doctor visits, patients may have 1 support person with them. For treatment visits, patients can not have anyone with them due to social distancing guidelines and our immunocompromised population.     

## 2019-11-03 NOTE — Progress Notes (Signed)
0849 Labs reviewed with and pt seen by Dr. [name] and pt approved for Kadcyla infusion today with added IV hydration with potassium and magnesium over 2 hours per MD                           [name] tolerated Kadcyla and IV hydration well without complaints or incident. VSS upon discharge. Pt discharged self ambulatory with assistance from her husband

## 2019-11-03 NOTE — Assessment & Plan Note (Addendum)
1.  Metastatic right breast cancer to the skin, ER/PR negative, HER-2 positive: -Initial presentation with skin involvement of the right breast, axillary region and posterior chest wall. -Skin punch biopsy on 07/28/2018 in the right breast positive for DLI. -12 cycles of weekly Taxol and Herceptin from 07/31/2018-10/30/2018. -PET/CT on 02/05/2019 showing local progression of disease with enlarging and increasing hypermetabolic right breast mass, multiple tiny 2 to 4 mm pulmonary nodule stable.  No definite sign of metastatic disease elsewhere. -10 cycles of Kadcyla from 02/15/2019 through 09/01/2019. -PET scan after 3 cycles on 04/15/2019 showed persistent and intense focal right breast hypermetabolic some, slightly increased with no evidence of metastatic disease. -I reviewed the films and results of the PET CT scan dated 09/20/2019.  There was a suggestion of progression on the PET CT scan. It appeared stable when I saw the scans. I have also talked to another radiologist Dr. Earma Reading who agreed with my assessment. -Treatment was held on 10/20/2019 and 10/27/2019 secondary to elevated total bilirubin of 1.4. -We reviewed her labs today.  Bilirubin improved to 1.2.  Hence she will proceed with her treatment today at dose level of 2.4 mg/kg. -I will reevaluate her in 3 weeks.  2.  High risk drug monitoring: -I have reviewed echo from 09/13/2019 which shows EF 65 to 70%.  3.  Hypokalemia: -Potassium is 3.4 today.  She will receive 20 mEq of potassium. -If it continues to be low, we will add home potassium at next visit.  4.  Skin rash: -She had papular itching rash in the upper back and mid back region. -I think it is coming from dry skin.  I have recommended using only moisturizing lotion.

## 2019-11-03 NOTE — Progress Notes (Signed)
11/03/19  New weight 73.5 kg - adjust dose according to new weight due to >10% weight change.  New dose = 180 mg (2.4 mg/kg)  T.O. Dr Rhys Martini, PharmD

## 2019-11-03 NOTE — Patient Instructions (Signed)
[facility] Discharge Instructions for Patients Receiving Chemotherapy   Beginning January 23rd 2017 lab work for the [facility] will be done in the  Main lab at [facility] on 1st floor. If you have a lab appointment with the [facility] please come in thru the  Main Entrance and check in at the main information desk   Today you received the following chemotherapy agents Kadcyla as well as IV hydration with magnesium and potassium. Follow-up as scheduled. Call clinic for any questions or concerns  To help prevent nausea and vomiting after your treatment, we encourage you to take your nausea medication   If you develop nausea and vomiting, or diarrhea that is not controlled by your medication, call the clinic.  The clinic phone number is [phone]. Office hours are Monday-Friday 8:30am-5:00pm.  BELOW ARE SYMPTOMS THAT SHOULD BE REPORTED IMMEDIATELY:  *FEVER GREATER THAN 101.0 F  *CHILLS WITH OR WITHOUT FEVER  NAUSEA AND VOMITING THAT IS NOT CONTROLLED WITH YOUR NAUSEA MEDICATION  *UNUSUAL SHORTNESS OF BREATH  *UNUSUAL BRUISING OR BLEEDING  TENDERNESS IN MOUTH AND THROAT WITH OR WITHOUT PRESENCE OF ULCERS  *URINARY PROBLEMS  *BOWEL PROBLEMS  UNUSUAL RASH Items with * indicate a potential emergency and should be followed up as soon as possible. If you have an emergency after office hours please contact your primary care physician or go to the nearest emergency department.  Please call the clinic during office hours if you have any questions or concerns.   You may also contact the Patient Navigator at [phone] should you have any questions or need assistance in obtaining follow up care.      Resources For Cancer Patients and their Caregivers ? American Cancer Society: Can assist with transportation, wigs, general needs, runs Look Good Feel Better.        5673947021 ? Cancer Care: Provides financial assistance, online support groups,  medication/co-pay assistance.  1-800-813-HOPE (301) 281-3151) ? [facility] Assists [place] cancer patients and their families through emotional , educational and financial support.  915-810-8683 ? [place] DSS Where to apply for food stamps, Medicaid and utility assistance. 415-720-8740 ? [facility]: Transportation to medical appointments. (934)131-7609 ? Social Security Administration: May apply for disability if have a Stage IV cancer. 930-748-9049 567-483-5632 ? [employer], Disability and Transit Services: Assists with nutrition, care and transit needs. [phone]

## 2019-11-03 NOTE — Progress Notes (Signed)
[place], [place] [address]   CLINIC:  Medical Oncology/Hematology  PCP:  [name], MD [address] [place] [address] [phone]   REASON FOR VISIT: Follow-up for right breast cancer, ER-/PR-/HER2+  CURRENT THERAPY: Kadcyla every 3 weeks   BRIEF ONCOLOGIC HISTORY:  Oncology History  Malignant neoplasm of upper-outer quadrant of right breast in female, estrogen receptor negative ([place])  07/03/2018 Initial Diagnosis   Palpable right breast mass for several months with skin discoloration, clinically skin trabecular thickening with nipple retraction measuring 7.2 x 4.3 x 4.2 cm, additional mass 4 o'clock position 2.6 cm, right axillary tail 1.2 cm right axillary lymph node 1.8 cm left breast irregular mass 1 cm, adjacent mass 0.9 cm together measuring 1.9 cm, no lymphadenopathy   07/03/2018 Pathology Results   Right breast biopsy: IDC grade 2, ER 0%, PR 0%, HER-2 positive, Ki-67 40%, lymph node biopsy negative, left breast biopsy complex sclerosing lesion    07/15/2018 Cancer Staging   Staging form: Breast, AJCC 8th Edition - Clinical: Stage IIB (cT3, cN0, cM0, G2, ER-, PR-, HER2+) - Signed by [name], MD on 07/15/2018   07/31/2018 - 10/30/2018 Chemotherapy   The patient had trastuzumab (HERCEPTIN) 336 mg in sodium chloride 0.9 % 250 mL chemo infusion, 4 mg/kg = 336 mg, Intravenous,  Once, 4 of 4 cycles Administration: 336 mg (07/31/2018), 168 mg (08/07/2018), 168 mg (08/28/2018), 168 mg (08/14/2018), 168 mg (08/21/2018), 168 mg (09/04/2018), 168 mg (09/11/2018), 168 mg (09/18/2018), 168 mg (10/02/2018), 168 mg (10/09/2018), 168 mg (10/16/2018), 168 mg (10/23/2018), 168 mg (10/30/2018) PACLitaxel (TAXOL) 78 mg in sodium chloride 0.9 % 250 mL chemo infusion (</= 91m/m2), 40 mg/m2 = 78 mg (50 % of original dose 80 mg/m2), Intravenous,  Once, 4 of 4 cycles Dose modification: 40 mg/m2 (50 % of original dose 80 mg/m2, Cycle 1, Reason: Patient  Age), 84 mg/m2 (66.7 % of original dose 80 mg/m2, Cycle 1, Reason: Provider Judgment), 45 mg/m2 (original dose 80 mg/m2, Cycle 3, Reason: Provider Judgment) Administration: 78 mg (07/31/2018), 102 mg (08/07/2018), 102 mg (08/28/2018), 102 mg (08/14/2018), 102 mg (08/21/2018), 102 mg (09/04/2018), 102 mg (09/18/2018), 84 mg (10/02/2018), 84 mg (10/09/2018), 84 mg (10/16/2018), 84 mg (10/23/2018), 84 mg (10/30/2018)  for chemotherapy treatment.    11/13/2018 - 02/14/2019 Chemotherapy   The patient had trastuzumab (HERCEPTIN) 504 mg in sodium chloride 0.9 % 250 mL chemo infusion, 6 mg/kg = 504 mg (100 % of original dose 6 mg/kg), Intravenous,  Once, 4 of 5 cycles Dose modification: 6 mg/kg (original dose 6 mg/kg, Cycle 1, Reason: Other (see comments), Comment: pt receving it ) Administration: 504 mg (11/13/2018), 504 mg (12/04/2018), 504 mg (01/04/2019), 504 mg (01/25/2019)  for chemotherapy treatment.    02/15/2019 -  Chemotherapy   The patient had ado-trastuzumab emtansine (KADCYLA) 240 mg in sodium chloride 0.9 % 250 mL chemo infusion, 3 mg/kg = 240 mg (100 % of original dose 3 mg/kg), Intravenous, Once, 12 of 14 cycles Dose modification: 3 mg/kg (original dose 3 mg/kg, Cycle 1, Reason: Provider Judgment), 3 mg/kg (original dose 3 mg/kg, Cycle 2, Reason: Provider Judgment), 2.4 mg/kg (original dose 3 mg/kg, Cycle 11, Reason: Other (see comments), Comment: bili upper limit of normal), 2.4 mg/kg (original dose 3 mg/kg, Cycle 12, Reason: Other (see comments), Comment: elevated bilirubin) Administration: 240 mg (02/15/2019), 240 mg (03/08/2019), 240 mg (05/10/2019), 240 mg (03/29/2019), 240 mg (04/19/2019), 240 mg (06/01/2019), 240 mg (06/22/2019), 240  mg (07/20/2019), 240 mg (08/10/2019), 240 mg (09/01/2019), 200 mg (09/22/2019), 180 mg (11/03/2019)  for chemotherapy treatment.       CANCER STAGING: Cancer Staging Malignant neoplasm of upper-outer quadrant of right breast in female, estrogen receptor negative ([place])  Staging form: Breast, AJCC 8th Edition - Clinical: Stage IIB (cT3, cN0, cM0, G2, ER-, PR-, HER2+) - Signed by [name], MD on 07/15/2018    INTERVAL HISTORY:  Ms. Rosello 86 y.o. female seen for follow-up of metastatic breast cancer.  She is accompanied by her husband.  Her treatment was held last 2 times secondary to elevated bilirubin.  Ports itching and skin rash in the mid back region.  Reports appetite 100%.  Energy levels are 50%.  Denies any fevers or chills.   REVIEW OF SYSTEMS:  Review of Systems  Constitutional: Positive for fatigue.  All other systems reviewed and are negative.    PAST MEDICAL/SURGICAL HISTORY:  Past Medical History:  Diagnosis Date  . Cancer ([place]) 06/2018   right breast cancer  . GERD (gastroesophageal reflux disease)    occasional   . Hyperlipidemia   . Hypertension    clearance with note Dr Nevada Crane on chart  . Pneumonia    2 years ago/ states occ cough with sinus drainage- no fever  . Thyroid nodule    with biopsy- states following medically   Past Surgical History:  Procedure Laterality Date  . CATARACT EXTRACTION W/PHACO  11/30/2012   Procedure: CATARACT EXTRACTION PHACO AND INTRAOCULAR LENS PLACEMENT (IOC);  Surgeon: Williams Che, MD;  Location: [facility];  Service: Ophthalmology;  Laterality: Left;  CDE:11.49  . CATARACT EXTRACTION W/PHACO Right 03/15/2013   Procedure: CATARACT EXTRACTION PHACO AND INTRAOCULAR LENS PLACEMENT (IOC);  Surgeon: Williams Che, MD;  Location: [facility];  Service: Ophthalmology;  Laterality: Right;  CDE 16.15  . COLONOSCOPY    . EXCISION OF SKIN TAG Right 06/17/2016   Procedure: SHAVE OF SKIN TAG RIGHT BUTTOCK;  Surgeon: Aviva Signs, MD;  Location: [facility];  Service: General;  Laterality: Right;  . JOINT REPLACEMENT     left knee  . MASS EXCISION Left 06/17/2016   Procedure: EXCISION SKIN MALIGNANT LESION LEFT BUTTOCK;  Surgeon: Aviva Signs, MD;  Location: [facility];  Service: General;  Laterality: Left;  . PORTACATH  PLACEMENT Right 07/28/2018   Procedure: INSERTION PORT-A-CATH WITH ULTRASOUND;  Surgeon: Rolm Bookbinder, MD;  Location: [facility];  Service: General;  Laterality: Right;  . PUNCH BIOPSY OF SKIN Right 07/28/2018   Procedure: PUNCH BIOPSY OF SKIN RIGHT BREAST;  Surgeon: Rolm Bookbinder, MD;  Location: [facility];  Service: General;  Laterality: Right;  . TOTAL KNEE ARTHROPLASTY  12/16/2011   Procedure: TOTAL KNEE ARTHROPLASTY;  Surgeon: Gearlean Alf, MD;  Location: [facility];  Service: Orthopedics;  Laterality: Right;  . TUBAL LIGATION       SOCIAL HISTORY:  Social History   Socioeconomic History  . Marital status: Married    Spouse name: Not on file  . Number of children: 2  . Years of education: some business school after HS  . Highest education level: Not on file  Occupational History  . Occupation: Cabin crew  . Occupation: Network engineer   Tobacco Use  . Smoking status: Never Smoker  . Smokeless tobacco: Never Used  Substance and Sexual Activity  . Alcohol use: No  . Drug use: No  . Sexual activity: Yes    Birth control/protection: None  Other Topics Concern  .  Not on file  Social History Narrative   Lives at home with her husband.   4 cups caffeine per day.   Right-handed.   Social Determinants of Health   Financial Resource Strain:   . Difficulty of Paying Living Expenses: Not on file  Food Insecurity:   . Worried About Charity fundraiser in the Last Year: Not on file  . Ran Out of Food in the Last Year: Not on file  Transportation Needs:   . Lack of Transportation (Medical): Not on file  . Lack of Transportation (Non-Medical): Not on file  Physical Activity:   . Days of Exercise per Week: Not on file  . Minutes of Exercise per Session: Not on file  Stress:   . Feeling of Stress : Not on file  Social Connections:   . Frequency of Communication with Friends and Family: Not on file  . Frequency of Social Gatherings with Friends and  Family: Not on file  . Attends Religious Services: Not on file  . Active Member of Clubs or Organizations: Not on file  . Attends Archivist Meetings: Not on file  . Marital Status: Not on file  Intimate Partner Violence:   . Fear of Current or Ex-Partner: Not on file  . Emotionally Abused: Not on file  . Physically Abused: Not on file  . Sexually Abused: Not on file    FAMILY HISTORY:  Family History  Problem Relation Age of Onset  . Heart attack Mother   . Bronchitis Father   . Diabetes Sister   . Leukemia Brother   . Lung disease Brother   . Lung disease Sister     CURRENT MEDICATIONS:  Outpatient Encounter Medications as of 11/03/2019  Medication Sig  . acetaminophen (TYLENOL) 325 MG tablet Take 650 mg by mouth every 6 (six) hours as needed.  Marland Kitchen aspirin 325 MG tablet Take 162.5 mg by mouth daily.   . cetirizine (ZYRTEC) 10 MG tablet Take 10 mg by mouth daily.  . hydrocortisone 1 % ointment Apply 1 application topically 2 (two) times daily.  Marland Kitchen lidocaine-prilocaine (EMLA) cream Apply to port site one hour prior to appointment and cover with plastic wrap.  . Misc. Devices MISC Please provide lymphedema sleeve for right arm  . Multiple Vitamin (MULTIVITAMIN WITH MINERALS) TABS tablet Take 1 tablet by mouth daily.  . Omega-3 Fatty Acids (FISH OIL PO) Take 1 capsule by mouth daily.   Glory Rosebush ULTRA test strip USE 1 STRIP TO CHECK GLUCOSE 2 TO 3 TIMES DAILY  . PACLitaxel (TAXOL IV) Inject into the vein once a week.  . potassium chloride (KLOR-CON) 10 MEQ tablet Take 1 tablet (10 mEq total) by mouth daily.  . Trastuzumab (HERCEPTIN IV) Inject into the vein once a week.    No facility-administered encounter medications on file as of 11/03/2019.    ALLERGIES:  Allergies  Allergen Reactions  . Penicillins Rash    Has patient had a PCN reaction causing immediate rash, facial/tongue/throat swelling, SOB or lightheadedness with hypotension: No Has patient had a PCN  reaction causing severe rash involving mucus membranes or skin necrosis: No Has patient had a PCN reaction that required hospitalization: No Has patient had a PCN reaction occurring within the last 10 years: No If all of the above answers are "NO", then may proceed with Cephalosporin use.      PHYSICAL EXAM:  ECOG Performance status: 1  Vitals:   11/03/19 0805  BP: 135/70  Pulse: Marland Kitchen)  116  Resp: 18  Temp: (!) 97.1 F (36.2 C)  SpO2: 99%   Filed Weights   11/03/19 0805  Weight: 162 lb (73.5 kg)    Physical Exam Constitutional:      Appearance: Normal appearance. She is normal weight.  Cardiovascular:     Rate and Rhythm: Normal rate and regular rhythm.     Heart sounds: Normal heart sounds.  Pulmonary:     Effort: Pulmonary effort is normal.     Breath sounds: Normal breath sounds.  Abdominal:     General: Bowel sounds are normal.     Palpations: Abdomen is soft.  Musculoskeletal:        General: Normal range of motion.  Skin:    General: Skin is warm and dry.  Neurological:     Mental Status: She is alert and oriented to person, place, and time. Mental status is at baseline.  Psychiatric:        Mood and Affect: Mood normal.        Behavior: Behavior normal.        Thought Content: Thought content normal.        Judgment: Judgment normal.    Right breast mass is stable.  No erythema over the breast mass or posterior chest wall.  LABORATORY DATA:  I have reviewed the labs as listed.  CBC    Component Value Date/Time   WBC 8.0 11/03/2019 0811   RBC 3.79 (L) 11/03/2019 0811   HGB 11.0 (L) 11/03/2019 0811   HGB 12.7 11/24/2017 1626   HCT 33.4 (L) 11/03/2019 0811   HCT 36.6 11/24/2017 1626   PLT 252 11/03/2019 0811   PLT 420 (H) 11/24/2017 1626   MCV 88.1 11/03/2019 0811   MCV 88 11/24/2017 1626   MCH 29.0 11/03/2019 0811   MCHC 32.9 11/03/2019 0811   RDW 19.9 (H) 11/03/2019 0811   RDW 15.4 11/24/2017 1626   LYMPHSABS 3.7 11/03/2019 0811   LYMPHSABS  2.5 11/24/2017 1626   MONOABS 1.0 11/03/2019 0811   EOSABS 0.5 11/03/2019 0811   EOSABS 0.1 11/24/2017 1626   BASOSABS 0.1 11/03/2019 0811   BASOSABS 0.0 11/24/2017 1626   CMP Latest Ref Rng & Units 11/03/2019 10/27/2019 10/13/2019  Glucose 70 - 99 mg/dL 114(H) 118(H) 129(H)  BUN 8 - 23 mg/dL 18 20 14   Creatinine 0.44 - 1.00 mg/dL 1.48(H) 1.29(H) 1.27(H)  Sodium 135 - 145 mmol/L 138 137 137  Potassium 3.5 - 5.1 mmol/L 3.4(L) 3.2(L) 3.3(L)  Chloride 98 - 111 mmol/L 105 105 104  CO2 22 - 32 mmol/L 19(L) 19(L) 20(L)  Calcium 8.9 - 10.3 mg/dL 9.6 9.1 9.5  Total Protein 6.5 - 8.1 g/dL 7.2 7.1 7.2  Total Bilirubin 0.3 - 1.2 mg/dL 1.2 1.4(H) 1.5(H)  Alkaline Phos 38 - 126 U/L 118 120 130(H)  AST 15 - 41 U/L 66(H) 94(H) 99(H)  ALT 0 - 44 U/L 30 38 41       DIAGNOSTIC IMAGING:  I have independently reviewed the scans and discussed with the patient.    ASSESSMENT & PLAN:   Malignant neoplasm of upper-outer quadrant of right breast in female, estrogen receptor negative ([place]) 1.  Metastatic right breast cancer to the skin, ER/PR negative, HER-2 positive: -Initial presentation with skin involvement of the right breast, axillary region and posterior chest wall. -Skin punch biopsy on 07/28/2018 in the right breast positive for DLI. -12 cycles of weekly Taxol and Herceptin from 07/31/2018-10/30/2018. -PET/CT on 02/05/2019 showing local progression  of disease with enlarging and increasing hypermetabolic right breast mass, multiple tiny 2 to 4 mm pulmonary nodule stable.  No definite sign of metastatic disease elsewhere. -10 cycles of Kadcyla from 02/15/2019 through 09/01/2019. -PET scan after 3 cycles on 04/15/2019 showed persistent and intense focal right breast hypermetabolic some, slightly increased with no evidence of metastatic disease. -I reviewed the films and results of the PET CT scan dated 09/20/2019.  There was a suggestion of progression on the PET CT scan. It appeared stable when I saw the  scans. I have also talked to another radiologist Dr. Earma Reading who agreed with my assessment. -Treatment was held on 10/20/2019 and 10/27/2019 secondary to elevated total bilirubin of 1.4. -We reviewed her labs today.  Bilirubin improved to 1.2.  Hence she will proceed with her treatment today at dose level of 2.4 mg/kg. -I will reevaluate her in 3 weeks.  2.  High risk drug monitoring: -I have reviewed echo from 09/13/2019 which shows EF 65 to 70%.  3.  Hypokalemia: -Potassium is 3.4 today.  She will receive 20 mEq of potassium. -If it continues to be low, we will add home potassium at next visit.  4.  Skin rash: -She had papular itching rash in the upper back and mid back region. -I think it is coming from dry skin.  I have recommended using only moisturizing lotion.      Orders placed this encounter:  Orders Placed This Encounter  Procedures  . CBC with Differential/Platelet  . Comprehensive metabolic panel  . Magnesium  . Lactate dehydrogenase      Derek Jack, MD [facility] [phone]

## 2019-11-08 ENCOUNTER — Encounter (HOSPITAL_COMMUNITY): Payer: Self-pay | Admitting: Hematology

## 2019-11-17 ENCOUNTER — Ambulatory Visit (HOSPITAL_COMMUNITY): Payer: Medicare HMO

## 2019-11-17 ENCOUNTER — Ambulatory Visit (HOSPITAL_COMMUNITY): Payer: Medicare HMO | Admitting: Hematology

## 2019-11-17 ENCOUNTER — Other Ambulatory Visit (HOSPITAL_COMMUNITY): Payer: Medicare HMO

## 2019-11-22 DIAGNOSIS — R69 Illness, unspecified: Secondary | ICD-10-CM | POA: Diagnosis not present

## 2019-11-24 ENCOUNTER — Inpatient Hospital Stay (HOSPITAL_BASED_OUTPATIENT_CLINIC_OR_DEPARTMENT_OTHER): Payer: Medicare HMO | Admitting: Hematology

## 2019-11-24 ENCOUNTER — Other Ambulatory Visit: Payer: Self-pay

## 2019-11-24 ENCOUNTER — Inpatient Hospital Stay (HOSPITAL_COMMUNITY): Payer: Medicare HMO

## 2019-11-24 DIAGNOSIS — Z171 Estrogen receptor negative status [ER-]: Secondary | ICD-10-CM | POA: Diagnosis not present

## 2019-11-24 DIAGNOSIS — C50411 Malignant neoplasm of upper-outer quadrant of right female breast: Secondary | ICD-10-CM

## 2019-11-24 DIAGNOSIS — Z5111 Encounter for antineoplastic chemotherapy: Secondary | ICD-10-CM | POA: Diagnosis not present

## 2019-11-24 LAB — CBC WITH DIFFERENTIAL/PLATELET
Abs Immature Granulocytes: 0.01 10*3/uL (ref 0.00–0.07)
Basophils Absolute: 0.1 10*3/uL (ref 0.0–0.1)
Basophils Relative: 1 %
Eosinophils Absolute: 0.5 10*3/uL (ref 0.0–0.5)
Eosinophils Relative: 6 %
HCT: 31.2 % — ABNORMAL LOW (ref 36.0–46.0)
Hemoglobin: 10.3 g/dL — ABNORMAL LOW (ref 12.0–15.0)
Immature Granulocytes: 0 %
Lymphocytes Relative: 35 %
Lymphs Abs: 2.8 10*3/uL (ref 0.7–4.0)
MCH: 28.8 pg (ref 26.0–34.0)
MCHC: 33 g/dL (ref 30.0–36.0)
MCV: 87.2 fL (ref 80.0–100.0)
Monocytes Absolute: 1 10*3/uL (ref 0.1–1.0)
Monocytes Relative: 12 %
Neutro Abs: 3.7 10*3/uL (ref 1.7–7.7)
Neutrophils Relative %: 46 %
Platelets: 223 10*3/uL (ref 150–400)
RBC: 3.58 MIL/uL — ABNORMAL LOW (ref 3.87–5.11)
RDW: 18.9 % — ABNORMAL HIGH (ref 11.5–15.5)
WBC: 8 10*3/uL (ref 4.0–10.5)
nRBC: 0 % (ref 0.0–0.2)

## 2019-11-24 LAB — COMPREHENSIVE METABOLIC PANEL
ALT: 35 U/L (ref 0–44)
AST: 75 U/L — ABNORMAL HIGH (ref 15–41)
Albumin: 2.8 g/dL — ABNORMAL LOW (ref 3.5–5.0)
Alkaline Phosphatase: 124 U/L (ref 38–126)
Anion gap: 11 (ref 5–15)
BUN: 19 mg/dL (ref 8–23)
CO2: 20 mmol/L — ABNORMAL LOW (ref 22–32)
Calcium: 9 mg/dL (ref 8.9–10.3)
Chloride: 104 mmol/L (ref 98–111)
Creatinine, Ser: 1.23 mg/dL — ABNORMAL HIGH (ref 0.44–1.00)
GFR calc Af Amer: 46 mL/min — ABNORMAL LOW (ref >60)
GFR calc non Af Amer: 39 mL/min — ABNORMAL LOW (ref >60)
Glucose, Bld: 108 mg/dL — ABNORMAL HIGH (ref 70–99)
Potassium: 3.3 mmol/L — ABNORMAL LOW (ref 3.5–5.1)
Sodium: 135 mmol/L (ref 135–145)
Total Bilirubin: 1.3 mg/dL — ABNORMAL HIGH (ref 0.3–1.2)
Total Protein: 6.9 g/dL (ref 6.5–8.1)

## 2019-11-24 LAB — MAGNESIUM: Magnesium: 1.7 mg/dL (ref 1.7–2.4)

## 2019-11-24 LAB — LACTATE DEHYDROGENASE: LDH: 194 U/L — ABNORMAL HIGH (ref 98–192)

## 2019-11-24 MED ORDER — HEPARIN SOD (PORK) LOCK FLUSH 100 UNIT/ML IV SOLN
500.0000 [IU] | Freq: Once | INTRAVENOUS | Status: AC
  Administered 2019-11-24: 500 [IU] via INTRAVENOUS

## 2019-11-24 MED ORDER — POTASSIUM CHLORIDE CRYS ER 20 MEQ PO TBCR: 20.0000 meq | EXTENDED_RELEASE_TABLET | Freq: Every day | ORAL | 2 refills | Status: DC

## 2019-11-24 MED ORDER — SODIUM CHLORIDE 0.9% FLUSH
10.0000 mL | Freq: Once | INTRAVENOUS | Status: AC
  Administered 2019-11-24: 10:00:00 10 mL via INTRAVENOUS

## 2019-11-24 NOTE — Progress Notes (Signed)
Patient has been assessed, vital signs and labs have been reviewed by Dr. [name]. ANC, Creatinine, LFTs, and Platelets are within treatment parameters, her bilirubin is elevated at 1.3 and potassium is low at 3.3 today per Dr. [name]. He will hold her treatment today, we will see her back in 2 weeks with labs and possible treatment.

## 2019-11-24 NOTE — Patient Instructions (Addendum)
[facility] at [facility] Discharge Instructions  You were seen today by Dr. [name]. He went over your recent lab results. Due to your bilirubin being elevated we will hold your treatment today. He will see you back in 2 weeks for labs, treatment and follow up.  Start taking 2 potassium tablets, 20 mEq daily. Once you finish that bottle take just one of the new prescription.  Thank you for choosing [facility] at [facility] to provide your oncology and hematology care.  To afford each patient quality time with our provider, please arrive at least 15 minutes before your scheduled appointment time.   If you have a lab appointment with the [facility] please come in thru the  Main Entrance and check in at the main information desk  You need to re-schedule your appointment should you arrive 10 or more minutes late.  We strive to give you quality time with our providers, and arriving late affects you and other patients whose appointments are after yours.  Also, if you no show three or more times for appointments you may be dismissed from the clinic at the providers discretion.     Again, thank you for choosing [facility].  Our hope is that these requests will decrease the amount of time that you wait before being seen by our physicians.       _____________________________________________________________  Should you have questions after your visit to [facility], please contact our office at [phone] between the hours of 8:00 a.m. and 4:30 p.m.  Voicemails left after 4:00 p.m. will not be returned until the following business day.  For prescription refill requests, have your pharmacy contact our office and allow 72 hours.    Cancer Center Support Programs:   > Cancer Support Group  2nd Tuesday of the month 1pm-2pm, Journey Room

## 2019-11-24 NOTE — Progress Notes (Signed)
[name], [place] [address]   CLINIC:  Medical Oncology/Hematology  PCP:  Adrienne Squibb, MD [address] [place] [address] [phone]   REASON FOR VISIT: Follow-up for right breast cancer, ER-/PR-/HER2+  CURRENT THERAPY: Kadcyla every 3 weeks   BRIEF ONCOLOGIC HISTORY:  Oncology History  Malignant neoplasm of upper-outer quadrant of right breast in female, estrogen receptor negative ([place])  07/03/2018 Initial Diagnosis   Palpable right breast mass for several months with skin discoloration, clinically skin trabecular thickening with nipple retraction measuring 7.2 x 4.3 x 4.2 cm, additional mass 4 o'clock position 2.6 cm, right axillary tail 1.2 cm right axillary lymph node 1.8 cm left breast irregular mass 1 cm, adjacent mass 0.9 cm together measuring 1.9 cm, no lymphadenopathy   07/03/2018 Pathology Results   Right breast biopsy: IDC grade 2, ER 0%, PR 0%, HER-2 positive, Ki-67 40%, lymph node biopsy negative, left breast biopsy complex sclerosing lesion    07/15/2018 Cancer Staging   Staging form: Breast, AJCC 8th Edition - Clinical: Stage IIB (cT3, cN0, cM0, G2, ER-, PR-, HER2+) - Signed by Adrienne Lose, MD on 07/15/2018   07/31/2018 - 10/30/2018 Chemotherapy   The patient had trastuzumab (HERCEPTIN) 336 mg in sodium chloride 0.9 % 250 mL chemo infusion, 4 mg/kg = 336 mg, Intravenous,  Once, 4 of 4 cycles Administration: 336 mg (07/31/2018), 168 mg (08/07/2018), 168 mg (08/28/2018), 168 mg (08/14/2018), 168 mg (08/21/2018), 168 mg (09/04/2018), 168 mg (09/11/2018), 168 mg (09/18/2018), 168 mg (10/02/2018), 168 mg (10/09/2018), 168 mg (10/16/2018), 168 mg (10/23/2018), 168 mg (10/30/2018) PACLitaxel (TAXOL) 78 mg in sodium chloride 0.9 % 250 mL chemo infusion (</= 82m/m2), 40 mg/m2 = 78 mg (50 % of original dose 80 mg/m2), Intravenous,  Once, 4 of 4 cycles Dose modification: 40 mg/m2 (50 % of original dose 80 mg/m2, Cycle 1, Reason: Patient  Age), 53.3333 mg/m2 (66.7 % of original dose 80 mg/m2, Cycle 1, Reason: Provider Judgment), 45 mg/m2 (original dose 80 mg/m2, Cycle 3, Reason: Provider Judgment) Administration: 78 mg (07/31/2018), 102 mg (08/07/2018), 102 mg (08/28/2018), 102 mg (08/14/2018), 102 mg (08/21/2018), 102 mg (09/04/2018), 102 mg (09/18/2018), 84 mg (10/02/2018), 84 mg (10/09/2018), 84 mg (10/16/2018), 84 mg (10/23/2018), 84 mg (10/30/2018)  for chemotherapy treatment.    11/13/2018 - 02/14/2019 Chemotherapy   The patient had trastuzumab (HERCEPTIN) 504 mg in sodium chloride 0.9 % 250 mL chemo infusion, 6 mg/kg = 504 mg (100 % of original dose 6 mg/kg), Intravenous,  Once, 4 of 5 cycles Dose modification: 6 mg/kg (original dose 6 mg/kg, Cycle 1, Reason: Other (see comments), Comment: pt receving it ) Administration: 504 mg (11/13/2018), 504 mg (12/04/2018), 504 mg (01/04/2019), 504 mg (01/25/2019)  for chemotherapy treatment.    02/15/2019 -  Chemotherapy   The patient had ado-trastuzumab emtansine (KADCYLA) 240 mg in sodium chloride 0.9 % 250 mL chemo infusion, 3 mg/kg = 240 mg (100 % of original dose 3 mg/kg), Intravenous, Once, 12 of 14 cycles Dose modification: 3 mg/kg (original dose 3 mg/kg, Cycle 1, Reason: Provider Judgment), 3 mg/kg (original dose 3 mg/kg, Cycle 2, Reason: Provider Judgment), 2.4 mg/kg (original dose 3 mg/kg, Cycle 11, Reason: Other (see comments), Comment: bili upper limit of normal), 2.4 mg/kg (original dose 3 mg/kg, Cycle 12, Reason: Other (see comments), Comment: elevated bilirubin) Administration: 240 mg (02/15/2019), 240 mg (03/08/2019), 240 mg (05/10/2019), 240 mg (03/29/2019), 240 mg (04/19/2019), 240 mg (06/01/2019), 240 mg (06/22/2019), 240  mg (07/20/2019), 240 mg (08/10/2019), 240 mg (09/01/2019), 200 mg (09/22/2019), 180 mg (11/03/2019)  for chemotherapy treatment.       CANCER STAGING: Cancer Staging Malignant neoplasm of upper-outer quadrant of right breast in female, estrogen receptor negative  ([place]) Staging form: Breast, AJCC 8th Edition - Clinical: Stage IIB (cT3, cN0, cM0, G2, ER-, PR-, HER2+) - Signed by Adrienne Lose, MD on 07/15/2018    INTERVAL HISTORY:  [name] 86 y.o. female seen for follow-up of metastatic breast cancer.  She is receiving Kadcyla every 3 weeks.  She is accompanied by her husband.  Reported appetite of 100% and energy levels of 50%.  Denies any chemotherapy related side effects.  Denies any signs or symptoms of PND or orthopnea.  REVIEW OF SYSTEMS:  Review of Systems  All other systems reviewed and are negative.    PAST MEDICAL/SURGICAL HISTORY:  Past Medical History:  Diagnosis Date  . Cancer ([place]) 06/2018   right breast cancer  . GERD (gastroesophageal reflux disease)    occasional   . Hyperlipidemia   . Hypertension    clearance with note Dr Nevada Crane on chart  . Pneumonia    2 years ago/ states occ cough with sinus drainage- no fever  . Thyroid nodule    with biopsy- states following medically   Past Surgical History:  Procedure Laterality Date  . CATARACT EXTRACTION W/PHACO  11/30/2012   Procedure: CATARACT EXTRACTION PHACO AND INTRAOCULAR LENS PLACEMENT (IOC);  Surgeon: Williams Che, MD;  Location: [facility];  Service: Ophthalmology;  Laterality: Left;  CDE:11.49  . CATARACT EXTRACTION W/PHACO Right 03/15/2013   Procedure: CATARACT EXTRACTION PHACO AND INTRAOCULAR LENS PLACEMENT (IOC);  Surgeon: Williams Che, MD;  Location: [facility];  Service: Ophthalmology;  Laterality: Right;  CDE 16.15  . COLONOSCOPY    . EXCISION OF SKIN TAG Right 06/17/2016   Procedure: SHAVE OF SKIN TAG RIGHT BUTTOCK;  Surgeon: Aviva Signs, MD;  Location: [facility];  Service: General;  Laterality: Right;  . JOINT REPLACEMENT     left knee  . MASS EXCISION Left 06/17/2016   Procedure: EXCISION SKIN MALIGNANT LESION LEFT BUTTOCK;  Surgeon: Aviva Signs, MD;  Location: [facility];  Service: General;  Laterality: Left;  . PORTACATH PLACEMENT Right 07/28/2018   Procedure:  INSERTION PORT-A-CATH WITH ULTRASOUND;  Surgeon: Rolm Bookbinder, MD;  Location: [facility];  Service: General;  Laterality: Right;  . PUNCH BIOPSY OF SKIN Right 07/28/2018   Procedure: PUNCH BIOPSY OF SKIN RIGHT BREAST;  Surgeon: Rolm Bookbinder, MD;  Location: [facility];  Service: General;  Laterality: Right;  . TOTAL KNEE ARTHROPLASTY  12/16/2011   Procedure: TOTAL KNEE ARTHROPLASTY;  Surgeon: Gearlean Alf, MD;  Location: [facility];  Service: Orthopedics;  Laterality: Right;  . TUBAL LIGATION       SOCIAL HISTORY:  Social History   Socioeconomic History  . Marital status: Married    Spouse name: Not on file  . Number of children: 2  . Years of education: some business school after HS  . Highest education level: Not on file  Occupational History  . Occupation: Cabin crew  . Occupation: Network engineer   Tobacco Use  . Smoking status: Never Smoker  . Smokeless tobacco: Never Used  Substance and Sexual Activity  . Alcohol use: No  . Drug use: No  . Sexual activity: Yes    Birth control/protection: None  Other Topics Concern  . Not on file  Social History Narrative  Lives at home with her husband.   4 cups caffeine per day.   Right-handed.   Social Determinants of Health   Financial Resource Strain:   . Difficulty of Paying Living Expenses: Not on file  Food Insecurity:   . Worried About Charity fundraiser in the Last Year: Not on file  . Ran Out of Food in the Last Year: Not on file  Transportation Needs:   . Lack of Transportation (Medical): Not on file  . Lack of Transportation (Non-Medical): Not on file  Physical Activity:   . Days of Exercise per Week: Not on file  . Minutes of Exercise per Session: Not on file  Stress:   . Feeling of Stress : Not on file  Social Connections:   . Frequency of Communication with Friends and Family: Not on file  . Frequency of Social Gatherings with Friends and Family: Not on file  . Attends  Religious Services: Not on file  . Active Member of Clubs or Organizations: Not on file  . Attends Archivist Meetings: Not on file  . Marital Status: Not on file  Intimate Partner Violence:   . Fear of Current or Ex-Partner: Not on file  . Emotionally Abused: Not on file  . Physically Abused: Not on file  . Sexually Abused: Not on file    FAMILY HISTORY:  Family History  Problem Relation Age of Onset  . Heart attack Mother   . Bronchitis Father   . Diabetes Sister   . Leukemia Brother   . Lung disease Brother   . Lung disease Sister     CURRENT MEDICATIONS:  Outpatient Encounter Medications as of 11/24/2019  Medication Sig  . aspirin 325 MG tablet Take 162.5 mg by mouth daily.   . cetirizine (ZYRTEC) 10 MG tablet Take 10 mg by mouth daily.  . hydrocortisone 1 % ointment Apply 1 application topically 2 (two) times daily.  Marland Kitchen lidocaine-prilocaine (EMLA) cream Apply to port site one hour prior to appointment and cover with plastic wrap.  . Misc. Devices MISC Please provide lymphedema sleeve for right arm  . Multiple Vitamin (MULTIVITAMIN WITH MINERALS) TABS tablet Take 1 tablet by mouth daily.  . Omega-3 Fatty Acids (FISH OIL PO) Take 1 capsule by mouth daily.   Glory Rosebush ULTRA test strip USE 1 STRIP TO CHECK GLUCOSE 2 TO 3 TIMES DAILY  . PACLitaxel (TAXOL IV) Inject into the vein once a week.  . potassium chloride (KLOR-CON) 10 MEQ tablet Take 1 tablet (10 mEq total) by mouth daily.  . Trastuzumab (HERCEPTIN IV) Inject into the vein once a week.   Marland Kitchen acetaminophen (TYLENOL) 325 MG tablet Take 650 mg by mouth every 6 (six) hours as needed.  . potassium chloride SA (KLOR-CON) 20 MEQ tablet Take 1 tablet (20 mEq total) by mouth daily.   No facility-administered encounter medications on file as of 11/24/2019.    ALLERGIES:  Allergies  Allergen Reactions  . Penicillins Rash    Has patient had a PCN reaction causing immediate rash, facial/tongue/throat swelling, SOB or  lightheadedness with hypotension: No Has patient had a PCN reaction causing severe rash involving mucus membranes or skin necrosis: No Has patient had a PCN reaction that required hospitalization: No Has patient had a PCN reaction occurring within the last 10 years: No If all of the above answers are "NO", then may proceed with Cephalosporin use.      PHYSICAL EXAM:  ECOG Performance status: 1  Vitals:  11/24/19 0955  BP: (!) 154/80  Pulse: 100  Resp: 18  Temp: (!) 97.3 F (36.3 C)  SpO2: 100%   Filed Weights   11/24/19 0955  Weight: 164 lb (74.4 kg)    Physical Exam Constitutional:      Appearance: Normal appearance. She is normal weight.  Cardiovascular:     Rate and Rhythm: Normal rate and regular rhythm.     Heart sounds: Normal heart sounds.  Pulmonary:     Effort: Pulmonary effort is normal.     Breath sounds: Normal breath sounds.  Abdominal:     General: Bowel sounds are normal.     Palpations: Abdomen is soft.  Musculoskeletal:        General: Normal range of motion.  Skin:    General: Skin is warm and dry.  Neurological:     Mental Status: She is alert and oriented to person, place, and time. Mental status is at baseline.  Psychiatric:        Mood and Affect: Mood normal.        Behavior: Behavior normal.        Thought Content: Thought content normal.        Judgment: Judgment normal.    Right breast mass is stable.  No erythema over the breast mass or posterior chest wall.  LABORATORY DATA:  I have reviewed the labs as listed.  CBC    Component Value Date/Time   WBC 8.0 11/24/2019 0955   RBC 3.58 (L) 11/24/2019 0955   HGB 10.3 (L) 11/24/2019 0955   HGB 12.7 11/24/2017 1626   HCT 31.2 (L) 11/24/2019 0955   HCT 36.6 11/24/2017 1626   PLT 223 11/24/2019 0955   PLT 420 (H) 11/24/2017 1626   MCV 87.2 11/24/2019 0955   MCV 88 11/24/2017 1626   MCH 28.8 11/24/2019 0955   MCHC 33.0 11/24/2019 0955   RDW 18.9 (H) 11/24/2019 0955   RDW 15.4  11/24/2017 1626   LYMPHSABS 2.8 11/24/2019 0955   LYMPHSABS 2.5 11/24/2017 1626   MONOABS 1.0 11/24/2019 0955   EOSABS 0.5 11/24/2019 0955   EOSABS 0.1 11/24/2017 1626   BASOSABS 0.1 11/24/2019 0955   BASOSABS 0.0 11/24/2017 1626   CMP Latest Ref Rng & Units 11/24/2019 11/03/2019 10/27/2019  Glucose 70 - 99 mg/dL 108(H) 114(H) 118(H)  BUN 8 - 23 mg/dL 19 18 20   Creatinine 0.44 - 1.00 mg/dL 1.23(H) 1.48(H) 1.29(H)  Sodium 135 - 145 mmol/L 135 138 137  Potassium 3.5 - 5.1 mmol/L 3.3(L) 3.4(L) 3.2(L)  Chloride 98 - 111 mmol/L 104 105 105  CO2 22 - 32 mmol/L 20(L) 19(L) 19(L)  Calcium 8.9 - 10.3 mg/dL 9.0 9.6 9.1  Total Protein 6.5 - 8.1 g/dL 6.9 7.2 7.1  Total Bilirubin 0.3 - 1.2 mg/dL 1.3(H) 1.2 1.4(H)  Alkaline Phos 38 - 126 U/L 124 118 120  AST 15 - 41 U/L 75(H) 66(H) 94(H)  ALT 0 - 44 U/L 35 30 38       DIAGNOSTIC IMAGING:  I have independently reviewed the scans and discussed with the patient.    ASSESSMENT & PLAN:   Malignant neoplasm of upper-outer quadrant of right breast in female, estrogen receptor negative ([place]) 1.  Metastatic right breast cancer to the skin, ER/PR negative, HER-2 positive: -Initial presentation with skin involvement of the right breast, axillary region and posterior chest wall. -Skin punch biopsy on 07/28/2018 in the right breast positive for DLI. -12 cycles of weekly Taxol and Herceptin  from 07/31/2018-10/30/2018. -PET/CT on 02/05/2019 showing local progression of disease with enlarging and increasing hypermetabolic right breast mass, multiple tiny 2 to 4 mm pulmonary nodule stable.  No definite sign of metastatic disease elsewhere. -10 cycles of Kadcyla from 02/15/2019 through 09/01/2019. -PET scan after 3 cycles on 04/15/2019 showed persistent and intense focal right breast hypermetabolic some, slightly increased with no evidence of metastatic disease. -I reviewed the films and results of the PET CT scan dated 09/20/2019.  There was a suggestion of  progression on the PET CT scan. It appeared stable when I saw the scans. I have also talked to another radiologist Dr. Earma Reading who agreed with my assessment. -He has tolerated last treatment of Kadcyla on 11/03/2019 at reduced dose of 2.4 mg/kg. -We reviewed her labs.  Total bilirubin increased to 1.3 today.  Hence we will hold her treatment today.  We will reevaluate her in 1 to 2 weeks.  2.  High risk drug monitoring: -I have reviewed echo from 09/13/2019 which shows EF 65 to 70%.  3.  Hypokalemia: -Today is 3.2.  It is continuing to be low. -We will increase her home potassium to 20 mEq daily.  4.  Skin rash: -She has maculopapular itching rash in the mid back region. -She is applying udder cream which is helping.      Orders placed this encounter:  No orders of the defined types were placed in this encounter.     Derek Jack, MD [place] [phone]

## 2019-11-24 NOTE — Progress Notes (Signed)
1030 Patient seen by Dr. [name] and lab results reviewed. Due to increasing bilirubin, treatment held today per Dr. [name]. Pt and husband aware. Port deaccessed. Pt discharged in satisfactory condition with follow up instructions.

## 2019-11-24 NOTE — Patient Instructions (Signed)
[facility] Discharge Instructions for Patients Receiving Chemotherapy   Beginning January 23rd 2017 lab work for the Cancer Center will be done in the  Main lab at [facility] on 1st floor. If you have a lab appointment with the Cancer Center please come in thru the  Main Entrance and check in at the main information desk   Today you received the following chemotherapy agents Kadcyla  To help prevent nausea and vomiting after your treatment, we encourage you to take your nausea medication     If you develop nausea and vomiting, or diarrhea that is not controlled by your medication, call the clinic.  The clinic phone number is [phone]. Office hours are Monday-Friday 8:30am-5:00pm.  BELOW ARE SYMPTOMS THAT SHOULD BE REPORTED IMMEDIATELY:  *FEVER GREATER THAN 101.0 F  *CHILLS WITH OR WITHOUT FEVER  NAUSEA AND VOMITING THAT IS NOT CONTROLLED WITH YOUR NAUSEA MEDICATION  *UNUSUAL SHORTNESS OF BREATH  *UNUSUAL BRUISING OR BLEEDING  TENDERNESS IN MOUTH AND THROAT WITH OR WITHOUT PRESENCE OF ULCERS  *URINARY PROBLEMS  *BOWEL PROBLEMS  UNUSUAL RASH Items with * indicate a potential emergency and should be followed up as soon as possible. If you have an emergency after office hours please contact your primary care physician or go to the nearest emergency department.  Please call the clinic during office hours if you have any questions or concerns.   You may also contact the Patient Navigator at [phone] should you have any questions or need assistance in obtaining follow up care.      Resources For Cancer Patients and their Caregivers ? American Cancer Society: Can assist with transportation, wigs, general needs, runs Look Good Feel Better.        1-888-227-6333 ? Cancer Care: Provides financial assistance, online support groups, medication/co-pay assistance.  1-800-813-HOPE (4673) ? [facility] Assists [place]  cancer patients and their families through emotional , educational and financial support.  336-427-4357 ? [place] DSS Where to apply for food stamps, Medicaid and utility assistance. 336-342-1394 ? [facility]: Transportation to medical appointments. 336-347-2287 ? Social Security Administration: May apply for disability if have a Stage IV cancer. 336-342-7796 1-800-772-1213 ? [place] Aging, Disability and Transit Services: Assists with nutrition, care and transit needs. 336-349-2343         

## 2019-11-28 NOTE — Assessment & Plan Note (Signed)
1.  Metastatic right breast cancer to the skin, ER/PR negative, HER-2 positive: -Initial presentation with skin involvement of the right breast, axillary region and posterior chest wall. -Skin punch biopsy on 07/28/2018 in the right breast positive for DLI. -12 cycles of weekly Taxol and Herceptin from 07/31/2018-10/30/2018. -PET/CT on 02/05/2019 showing local progression of disease with enlarging and increasing hypermetabolic right breast mass, multiple tiny 2 to 4 mm pulmonary nodule stable.  No definite sign of metastatic disease elsewhere. -10 cycles of Kadcyla from 02/15/2019 through 09/01/2019. -PET scan after 3 cycles on 04/15/2019 showed persistent and intense focal right breast hypermetabolic some, slightly increased with no evidence of metastatic disease. -I reviewed the films and results of the PET CT scan dated 09/20/2019.  There was a suggestion of progression on the PET CT scan. It appeared stable when I saw the scans. I have also talked to another radiologist Dr. Earma Reading who agreed with my assessment. -He has tolerated last treatment of Kadcyla on 11/03/2019 at reduced dose of 2.4 mg/kg. -We reviewed her labs.  Total bilirubin increased to 1.3 today.  Hence we will hold her treatment today.  We will reevaluate her in 1 to 2 weeks.  2.  High risk drug monitoring: -I have reviewed echo from 09/13/2019 which shows EF 65 to 70%.  3.  Hypokalemia: -Today is 3.2.  It is continuing to be low. -We will increase her home potassium to 20 mEq daily.  4.  Skin rash: -She has maculopapular itching rash in the mid back region. -She is applying udder cream which is helping.

## 2019-11-30 ENCOUNTER — Ambulatory Visit (HOSPITAL_COMMUNITY): Payer: Medicare HMO | Admitting: Hematology

## 2019-11-30 ENCOUNTER — Other Ambulatory Visit (HOSPITAL_COMMUNITY): Payer: Medicare HMO

## 2019-11-30 ENCOUNTER — Ambulatory Visit (HOSPITAL_COMMUNITY): Payer: Medicare HMO

## 2019-12-09 ENCOUNTER — Inpatient Hospital Stay (HOSPITAL_COMMUNITY): Payer: Medicare HMO

## 2019-12-09 ENCOUNTER — Inpatient Hospital Stay (HOSPITAL_BASED_OUTPATIENT_CLINIC_OR_DEPARTMENT_OTHER): Payer: Medicare HMO | Admitting: Hematology

## 2019-12-09 ENCOUNTER — Inpatient Hospital Stay (HOSPITAL_COMMUNITY): Payer: Medicare HMO | Attending: Hematology

## 2019-12-09 ENCOUNTER — Encounter (HOSPITAL_COMMUNITY): Payer: Self-pay | Admitting: Hematology

## 2019-12-09 ENCOUNTER — Other Ambulatory Visit: Payer: Self-pay

## 2019-12-09 VITALS — BP 141/71 | HR 108 | Temp 97.2°F | Resp 18 | Wt 162.0 lb

## 2019-12-09 VITALS — BP 154/70 | HR 87 | Temp 96.9°F | Resp 18

## 2019-12-09 DIAGNOSIS — I10 Essential (primary) hypertension: Secondary | ICD-10-CM | POA: Diagnosis not present

## 2019-12-09 DIAGNOSIS — Z171 Estrogen receptor negative status [ER-]: Secondary | ICD-10-CM | POA: Diagnosis not present

## 2019-12-09 DIAGNOSIS — Z9221 Personal history of antineoplastic chemotherapy: Secondary | ICD-10-CM | POA: Diagnosis not present

## 2019-12-09 DIAGNOSIS — E876 Hypokalemia: Secondary | ICD-10-CM | POA: Diagnosis not present

## 2019-12-09 DIAGNOSIS — Z5112 Encounter for antineoplastic immunotherapy: Secondary | ICD-10-CM | POA: Diagnosis not present

## 2019-12-09 DIAGNOSIS — R21 Rash and other nonspecific skin eruption: Secondary | ICD-10-CM | POA: Insufficient documentation

## 2019-12-09 DIAGNOSIS — C50411 Malignant neoplasm of upper-outer quadrant of right female breast: Secondary | ICD-10-CM | POA: Diagnosis not present

## 2019-12-09 DIAGNOSIS — Z79899 Other long term (current) drug therapy: Secondary | ICD-10-CM | POA: Insufficient documentation

## 2019-12-09 DIAGNOSIS — Z7982 Long term (current) use of aspirin: Secondary | ICD-10-CM | POA: Insufficient documentation

## 2019-12-09 DIAGNOSIS — E785 Hyperlipidemia, unspecified: Secondary | ICD-10-CM | POA: Diagnosis not present

## 2019-12-09 DIAGNOSIS — K219 Gastro-esophageal reflux disease without esophagitis: Secondary | ICD-10-CM | POA: Diagnosis not present

## 2019-12-09 LAB — COMPREHENSIVE METABOLIC PANEL
ALT: 29 U/L (ref 0–44)
AST: 57 U/L — ABNORMAL HIGH (ref 15–41)
Albumin: 3 g/dL — ABNORMAL LOW (ref 3.5–5.0)
Alkaline Phosphatase: 114 U/L (ref 38–126)
Anion gap: 8 (ref 5–15)
BUN: 22 mg/dL (ref 8–23)
CO2: 21 mmol/L — ABNORMAL LOW (ref 22–32)
Calcium: 9 mg/dL (ref 8.9–10.3)
Chloride: 108 mmol/L (ref 98–111)
Creatinine, Ser: 1.28 mg/dL — ABNORMAL HIGH (ref 0.44–1.00)
GFR calc Af Amer: 44 mL/min — ABNORMAL LOW (ref >60)
GFR calc non Af Amer: 38 mL/min — ABNORMAL LOW (ref >60)
Glucose, Bld: 112 mg/dL — ABNORMAL HIGH (ref 70–99)
Potassium: 3.5 mmol/L (ref 3.5–5.1)
Sodium: 137 mmol/L (ref 135–145)
Total Bilirubin: 1.1 mg/dL (ref 0.3–1.2)
Total Protein: 7 g/dL (ref 6.5–8.1)

## 2019-12-09 LAB — CBC WITH DIFFERENTIAL/PLATELET
Abs Immature Granulocytes: 0.02 10*3/uL (ref 0.00–0.07)
Basophils Absolute: 0.1 10*3/uL (ref 0.0–0.1)
Basophils Relative: 1 %
Eosinophils Absolute: 0.7 10*3/uL — ABNORMAL HIGH (ref 0.0–0.5)
Eosinophils Relative: 8 %
HCT: 33.3 % — ABNORMAL LOW (ref 36.0–46.0)
Hemoglobin: 10.6 g/dL — ABNORMAL LOW (ref 12.0–15.0)
Immature Granulocytes: 0 %
Lymphocytes Relative: 43 %
Lymphs Abs: 4.2 10*3/uL — ABNORMAL HIGH (ref 0.7–4.0)
MCH: 28 pg (ref 26.0–34.0)
MCHC: 31.8 g/dL (ref 30.0–36.0)
MCV: 88.1 fL (ref 80.0–100.0)
Monocytes Absolute: 1 10*3/uL (ref 0.1–1.0)
Monocytes Relative: 11 %
Neutro Abs: 3.5 10*3/uL (ref 1.7–7.7)
Neutrophils Relative %: 37 %
Platelets: 245 10*3/uL (ref 150–400)
RBC: 3.78 MIL/uL — ABNORMAL LOW (ref 3.87–5.11)
RDW: 18.8 % — ABNORMAL HIGH (ref 11.5–15.5)
WBC: 9.6 10*3/uL (ref 4.0–10.5)
nRBC: 0 % (ref 0.0–0.2)

## 2019-12-09 LAB — LACTATE DEHYDROGENASE: LDH: 193 U/L — ABNORMAL HIGH (ref 98–192)

## 2019-12-09 LAB — MAGNESIUM: Magnesium: 1.9 mg/dL (ref 1.7–2.4)

## 2019-12-09 MED ORDER — SODIUM CHLORIDE 0.9 % IV SOLN: Freq: Once | INTRAVENOUS | Status: AC

## 2019-12-09 MED ORDER — HEPARIN SOD (PORK) LOCK FLUSH 100 UNIT/ML IV SOLN
500.0000 [IU] | Freq: Once | INTRAVENOUS | Status: AC | PRN
  Administered 2019-12-09: 500 [IU]

## 2019-12-09 MED ORDER — SODIUM CHLORIDE 0.9 % IV SOLN
2.3000 mg/kg | Freq: Once | INTRAVENOUS | Status: AC
  Administered 2019-12-09: 180 mg via INTRAVENOUS
  Filled 2019-12-09: qty 9

## 2019-12-09 MED ORDER — SODIUM CHLORIDE 0.9% FLUSH
10.0000 mL | INTRAVENOUS | Status: DC | PRN
  Administered 2019-12-09: 08:00:00 10 mL

## 2019-12-09 MED ORDER — ACETAMINOPHEN 325 MG PO TABS
650.0000 mg | ORAL_TABLET | Freq: Once | ORAL | Status: AC
  Administered 2019-12-09: 650 mg via ORAL
  Filled 2019-12-09: qty 2

## 2019-12-09 MED ORDER — DIPHENHYDRAMINE HCL 25 MG PO CAPS
50.0000 mg | ORAL_CAPSULE | Freq: Once | ORAL | Status: AC
  Administered 2019-12-09: 10:00:00 50 mg via ORAL
  Filled 2019-12-09: qty 2

## 2019-12-09 NOTE — Progress Notes (Signed)
0930 Labs reviewed with and pt seen by Dr. [name] and pt approved for Kadcyla infusion today per MD                                   [name] tolerated Kadcyla infusion well without complaints or incident. VSS upon discharge. Pt discharged self ambulatory in satisfactory condition with assistance from her husband

## 2019-12-09 NOTE — Progress Notes (Signed)
[name], [place] [id number]   CLINIC:  Medical Oncology/Hematology  PCP:  [name], MD [address] [place] [address] [phone]   REASON FOR VISIT: Follow-up for right breast cancer, ER-/PR-/HER2+  CURRENT THERAPY: Kadcyla every 3 weeks   BRIEF ONCOLOGIC HISTORY:  Oncology History  Malignant neoplasm of upper-outer quadrant of right breast in female, estrogen receptor negative ([place])  07/03/2018 Initial Diagnosis   Palpable right breast mass for several months with skin discoloration, clinically skin trabecular thickening with nipple retraction measuring 7.2 x 4.3 x 4.2 cm, additional mass 4 o'clock position 2.6 cm, right axillary tail 1.2 cm right axillary lymph node 1.8 cm left breast irregular mass 1 cm, adjacent mass 0.9 cm together measuring 1.9 cm, no lymphadenopathy   07/03/2018 Pathology Results   Right breast biopsy: IDC grade 2, ER 0%, PR 0%, HER-2 positive, Ki-67 40%, lymph node biopsy negative, left breast biopsy complex sclerosing lesion    07/15/2018 Cancer Staging   Staging form: Breast, AJCC 8th Edition - Clinical: Stage IIB (cT3, cN0, cM0, G2, ER-, PR-, HER2+) - Signed by [name], MD on 07/15/2018   07/31/2018 - 10/30/2018 Chemotherapy   The patient had trastuzumab (HERCEPTIN) 336 mg in sodium chloride 0.9 % 250 mL chemo infusion, 4 mg/kg = 336 mg, Intravenous,  Once, 4 of 4 cycles Administration: 336 mg (07/31/2018), 168 mg (08/07/2018), 168 mg (08/28/2018), 168 mg (08/14/2018), 168 mg (08/21/2018), 168 mg (09/04/2018), 168 mg (09/11/2018), 168 mg (09/18/2018), 168 mg (10/02/2018), 168 mg (10/09/2018), 168 mg (10/16/2018), 168 mg (10/23/2018), 168 mg (10/30/2018) PACLitaxel (TAXOL) 78 mg in sodium chloride 0.9 % 250 mL chemo infusion (</= 65m/m2), 40 mg/m2 = 78 mg (50 % of original dose 80 mg/m2), Intravenous,  Once, 4 of 4 cycles Dose modification: 40 mg/m2 (50 % of original dose 80 mg/m2, Cycle 1, Reason: Patient  Age), 53.3333 mg/m2 (66.7 % of original dose 80 mg/m2, Cycle 1, Reason: Provider Judgment), 45 mg/m2 (original dose 80 mg/m2, Cycle 3, Reason: Provider Judgment) Administration: 78 mg (07/31/2018), 102 mg (08/07/2018), 102 mg (08/28/2018), 102 mg (08/14/2018), 102 mg (08/21/2018), 102 mg (09/04/2018), 102 mg (09/18/2018), 84 mg (10/02/2018), 84 mg (10/09/2018), 84 mg (10/16/2018), 84 mg (10/23/2018), 84 mg (10/30/2018)  for chemotherapy treatment.    11/13/2018 - 02/14/2019 Chemotherapy   The patient had trastuzumab (HERCEPTIN) 504 mg in sodium chloride 0.9 % 250 mL chemo infusion, 6 mg/kg = 504 mg (100 % of original dose 6 mg/kg), Intravenous,  Once, 4 of 5 cycles Dose modification: 6 mg/kg (original dose 6 mg/kg, Cycle 1, Reason: Other (see comments), Comment: pt receving it ) Administration: 504 mg (11/13/2018), 504 mg (12/04/2018), 504 mg (01/04/2019), 504 mg (01/25/2019)  for chemotherapy treatment.    02/15/2019 -  Chemotherapy   The patient had ado-trastuzumab emtansine (KADCYLA) 240 mg in sodium chloride 0.9 % 250 mL chemo infusion, 3 mg/kg = 240 mg (100 % of original dose 3 mg/kg), Intravenous, Once, 13 of 14 cycles Dose modification: 3 mg/kg (original dose 3 mg/kg, Cycle 1, Reason: Provider Judgment), 3 mg/kg (original dose 3 mg/kg, Cycle 2, Reason: Provider Judgment), 2.4 mg/kg (original dose 3 mg/kg, Cycle 11, Reason: Other (see comments), Comment: bili upper limit of normal), 2.4 mg/kg (original dose 3 mg/kg, Cycle 12, Reason: Other (see comments), Comment: elevated bilirubin) Administration: 240 mg (02/15/2019), 240 mg (03/08/2019), 240 mg (05/10/2019), 240 mg (03/29/2019), 240 mg (04/19/2019), 240 mg (06/01/2019), 240 mg (06/22/2019), 240  mg (07/20/2019), 240 mg (08/10/2019), 240 mg (09/01/2019), 200 mg (09/22/2019), 180 mg (11/03/2019), 180 mg (12/09/2019)  for chemotherapy treatment.       CANCER STAGING: Cancer Staging Malignant neoplasm of upper-outer quadrant of right breast in female, estrogen receptor  negative ([place]) Staging form: Breast, AJCC 8th Edition - Clinical: Stage IIB (cT3, cN0, cM0, G2, ER-, PR-, HER2+) - Signed by [name], MD on 07/15/2018    INTERVAL HISTORY:  [name] 85 y.o. female seen for follow-up of metastatic breast cancer.  She is accompanied by her husband.  Denies any nausea, vomiting, diarrhea or constipation.  There is mild itching in the mid back region.  She is applying udder cream in that area.  Appetite is 100%.  Energy levels are 50%.  Denies any fevers, night sweats or weight loss.  Denies any headaches or vision changes.  REVIEW OF SYSTEMS:  Review of Systems  All other systems reviewed and are negative.    PAST MEDICAL/SURGICAL HISTORY:  Past Medical History:  Diagnosis Date  . Cancer ([place]) 06/2018   right breast cancer  . GERD (gastroesophageal reflux disease)    occasional   . Hyperlipidemia   . Hypertension    clearance with note Dr Nevada Crane on chart  . Pneumonia    2 years ago/ states occ cough with sinus drainage- no fever  . Thyroid nodule    with biopsy- states following medically   Past Surgical History:  Procedure Laterality Date  . CATARACT EXTRACTION W/PHACO  11/30/2012   Procedure: CATARACT EXTRACTION PHACO AND INTRAOCULAR LENS PLACEMENT (IOC);  Surgeon: Williams Che, MD;  Location: [facility];  Service: Ophthalmology;  Laterality: Left;  CDE:11.49  . CATARACT EXTRACTION W/PHACO Right 03/15/2013   Procedure: CATARACT EXTRACTION PHACO AND INTRAOCULAR LENS PLACEMENT (IOC);  Surgeon: Williams Che, MD;  Location: [facility];  Service: Ophthalmology;  Laterality: Right;  CDE 16.15  . COLONOSCOPY    . EXCISION OF SKIN TAG Right 06/17/2016   Procedure: SHAVE OF SKIN TAG RIGHT BUTTOCK;  Surgeon: Aviva Signs, MD;  Location: [facility];  Service: General;  Laterality: Right;  . JOINT REPLACEMENT     left knee  . MASS EXCISION Left 06/17/2016   Procedure: EXCISION SKIN MALIGNANT LESION LEFT BUTTOCK;  Surgeon: Aviva Signs, MD;  Location: [facility];   Service: General;  Laterality: Left;  . PORTACATH PLACEMENT Right 07/28/2018   Procedure: INSERTION PORT-A-CATH WITH ULTRASOUND;  Surgeon: Rolm Bookbinder, MD;  Location: [facility];  Service: General;  Laterality: Right;  . PUNCH BIOPSY OF SKIN Right 07/28/2018   Procedure: PUNCH BIOPSY OF SKIN RIGHT BREAST;  Surgeon: Rolm Bookbinder, MD;  Location: [facility];  Service: General;  Laterality: Right;  . TOTAL KNEE ARTHROPLASTY  12/16/2011   Procedure: TOTAL KNEE ARTHROPLASTY;  Surgeon: Gearlean Alf, MD;  Location: [facility];  Service: Orthopedics;  Laterality: Right;  . TUBAL LIGATION       SOCIAL HISTORY:  Social History   Socioeconomic History  . Marital status: Married    Spouse name: Not on file  . Number of children: 2  . Years of education: some business school after HS  . Highest education level: Not on file  Occupational History  . Occupation: Cabin crew  . Occupation: Network engineer   Tobacco Use  . Smoking status: Never Smoker  . Smokeless tobacco: Never Used  Substance and Sexual Activity  . Alcohol use: No  . Drug use: No  . Sexual activity: Yes  Birth control/protection: None  Other Topics Concern  . Not on file  Social History Narrative   Lives at home with her husband.   4 cups caffeine per day.   Right-handed.   Social Determinants of Health   Financial Resource Strain:   . Difficulty of Paying Living Expenses: Not on file  Food Insecurity:   . Worried About Charity fundraiser in the Last Year: Not on file  . Ran Out of Food in the Last Year: Not on file  Transportation Needs:   . Lack of Transportation (Medical): Not on file  . Lack of Transportation (Non-Medical): Not on file  Physical Activity:   . Days of Exercise per Week: Not on file  . Minutes of Exercise per Session: Not on file  Stress:   . Feeling of Stress : Not on file  Social Connections:   . Frequency of Communication with Friends and Family: Not on file    . Frequency of Social Gatherings with Friends and Family: Not on file  . Attends Religious Services: Not on file  . Active Member of Clubs or Organizations: Not on file  . Attends Archivist Meetings: Not on file  . Marital Status: Not on file  Intimate Partner Violence:   . Fear of Current or Ex-Partner: Not on file  . Emotionally Abused: Not on file  . Physically Abused: Not on file  . Sexually Abused: Not on file    FAMILY HISTORY:  Family History  Problem Relation Age of Onset  . Heart attack Mother   . Bronchitis Father   . Diabetes Sister   . Leukemia Brother   . Lung disease Brother   . Lung disease Sister     CURRENT MEDICATIONS:  Outpatient Encounter Medications as of 12/09/2019  Medication Sig  . aspirin 325 MG tablet Take 162.5 mg by mouth daily.   . cetirizine (ZYRTEC) 10 MG tablet Take 10 mg by mouth daily.  . hydrocortisone 1 % ointment Apply 1 application topically 2 (two) times daily.  Marland Kitchen lidocaine-prilocaine (EMLA) cream Apply to port site one hour prior to appointment and cover with plastic wrap.  . Misc. Devices MISC Please provide lymphedema sleeve for right arm  . Multiple Vitamin (MULTIVITAMIN WITH MINERALS) TABS tablet Take 1 tablet by mouth daily.  . Omega-3 Fatty Acids (FISH OIL PO) Take 1 capsule by mouth daily.   Glory Rosebush ULTRA test strip USE 1 STRIP TO CHECK GLUCOSE 2 TO 3 TIMES DAILY  . PACLitaxel (TAXOL IV) Inject into the vein once a week.  . potassium chloride SA (KLOR-CON) 20 MEQ tablet Take 1 tablet (20 mEq total) by mouth daily.  . Trastuzumab (HERCEPTIN IV) Inject into the vein once a week.   Marland Kitchen acetaminophen (TYLENOL) 325 MG tablet Take 650 mg by mouth every 6 (six) hours as needed.  . [DISCONTINUED] potassium chloride (KLOR-CON) 10 MEQ tablet Take 1 tablet (10 mEq total) by mouth daily.   No facility-administered encounter medications on file as of 12/09/2019.    ALLERGIES:  Allergies  Allergen Reactions  . Penicillins Rash     Has patient had a PCN reaction causing immediate rash, facial/tongue/throat swelling, SOB or lightheadedness with hypotension: No Has patient had a PCN reaction causing severe rash involving mucus membranes or skin necrosis: No Has patient had a PCN reaction that required hospitalization: No Has patient had a PCN reaction occurring within the last 10 years: No If all of the above answers are "NO",  then may proceed with Cephalosporin use.      PHYSICAL EXAM:  ECOG Performance status: 1  Vitals:   12/09/19 0832  BP: (!) 141/71  Pulse: (!) 108  Resp: 18  Temp: (!) 97.2 F (36.2 C)  SpO2: 99%   Filed Weights   12/09/19 0832  Weight: 162 lb (73.5 kg)    Physical Exam Constitutional:      Appearance: Normal appearance. She is normal weight.  Cardiovascular:     Rate and Rhythm: Normal rate and regular rhythm.     Heart sounds: Normal heart sounds.  Pulmonary:     Effort: Pulmonary effort is normal.     Breath sounds: Normal breath sounds.  Abdominal:     General: Bowel sounds are normal.     Palpations: Abdomen is soft.  Musculoskeletal:        General: Normal range of motion.  Skin:    General: Skin is warm and dry.  Neurological:     Mental Status: She is alert and oriented to person, place, and time. Mental status is at baseline.  Psychiatric:        Mood and Affect: Mood normal.        Behavior: Behavior normal.        Thought Content: Thought content normal.        Judgment: Judgment normal.      LABORATORY DATA:  I have reviewed the labs as listed.  CBC    Component Value Date/Time   WBC 9.6 12/09/2019 0841   RBC 3.78 (L) 12/09/2019 0841   HGB 10.6 (L) 12/09/2019 0841   HGB 12.7 11/24/2017 1626   HCT 33.3 (L) 12/09/2019 0841   HCT 36.6 11/24/2017 1626   PLT 245 12/09/2019 0841   PLT 420 (H) 11/24/2017 1626   MCV 88.1 12/09/2019 0841   MCV 88 11/24/2017 1626   MCH 28.0 12/09/2019 0841   MCHC 31.8 12/09/2019 0841   RDW 18.8 (H) 12/09/2019 0841    RDW 15.4 11/24/2017 1626   LYMPHSABS 4.2 (H) 12/09/2019 0841   LYMPHSABS 2.5 11/24/2017 1626   MONOABS 1.0 12/09/2019 0841   EOSABS 0.7 (H) 12/09/2019 0841   EOSABS 0.1 11/24/2017 1626   BASOSABS 0.1 12/09/2019 0841   BASOSABS 0.0 11/24/2017 1626   CMP Latest Ref Rng & Units 12/09/2019 11/24/2019 11/03/2019  Glucose 70 - 99 mg/dL 112(H) 108(H) 114(H)  BUN 8 - 23 mg/dL 22 19 18   Creatinine 0.44 - 1.00 mg/dL 1.28(H) 1.23(H) 1.48(H)  Sodium 135 - 145 mmol/L 137 135 138  Potassium 3.5 - 5.1 mmol/L 3.5 3.3(L) 3.4(L)  Chloride 98 - 111 mmol/L 108 104 105  CO2 22 - 32 mmol/L 21(L) 20(L) 19(L)  Calcium 8.9 - 10.3 mg/dL 9.0 9.0 9.6  Total Protein 6.5 - 8.1 g/dL 7.0 6.9 7.2  Total Bilirubin 0.3 - 1.2 mg/dL 1.1 1.3(H) 1.2  Alkaline Phos 38 - 126 U/L 114 124 118  AST 15 - 41 U/L 57(H) 75(H) 66(H)  ALT 0 - 44 U/L 29 35 30       DIAGNOSTIC IMAGING:  I have independently reviewed the scans and discussed with the patient.    ASSESSMENT & PLAN:   Malignant neoplasm of upper-outer quadrant of right breast in female, estrogen receptor negative ([place]) 1.  Metastatic right breast cancer to the skin, ER/PR negative, HER-2 positive: -Initial presentation with skin involvement of the right breast, axillary region and posterior chest wall. -Skin punch biopsy on 07/28/2018 in the right breast  positive for DLI. -12 cycles of weekly Taxol and Herceptin from 07/31/2018-10/30/2018 with PET scan showing progression. -13 cycles of Kadcyla from 02/15/2019 through 12/09/2019.   -Treatment dose of Kadcyla decreased to 2.4 mg/kg on 11/03/2019. -Last treatment was held because of elevated total bilirubin.  She has been doing very well. -I reviewed her LFTs which showed total bilirubin 1.1.  We will continue Kadcyla at the same dose of 2.4 mg/kg. -Her CBC is also grossly within normal limits.  I plan to repeat PET scan prior to next visit in 3 weeks..  2.  High risk drug monitoring: -Reviewed echo from 09/13/2019  which shows EF 65-70%. -We will consider repeating it in 6 months or if symptomatic.  3.  Hypokalemia: -She will continue potassium 20 mEq daily at home.  Potassium today is 3.5.  4.  Skin rash: -Rash in the mid back region has improved.  She is applying udder cream.      Orders placed this encounter:  Orders Placed This Encounter  Procedures  . NM PET Image Restag (PS) Skull Base To Thigh  . CBC with Differential/Platelet  . Comprehensive metabolic panel      Derek Jack, MD [facility] [phone]

## 2019-12-09 NOTE — Progress Notes (Signed)
Patient has been assessed, vital signs and labs have been reviewed by Dr. [name]. ANC, Creatinine, LFTs, and Platelets are within treatment parameters per Dr. [name]. The patient is good to proceed with treatment at this time. Continue treatment at the same dose per Dr. [name].

## 2019-12-09 NOTE — Patient Instructions (Signed)
[facility] Discharge Instructions for Patients Receiving Chemotherapy   Beginning January 23rd 2017 lab work for the Cancer Center will be done in the  Main lab at  on 1st floor. If you have a lab appointment with the Cancer Center please come in thru the  Main Entrance and check in at the main information desk   Today you received the following chemotherapy agents Kadcyla. Follow-up as scheduled. Call clinic for any questions or concerns  To help prevent nausea and vomiting after your treatment, we encourage you to take your nausea medication   If you develop nausea and vomiting, or diarrhea that is not controlled by your medication, call the clinic.  The clinic phone number is [phone]. Office hours are Monday-Friday 8:30am-5:00pm.  BELOW ARE SYMPTOMS THAT SHOULD BE REPORTED IMMEDIATELY:  *FEVER GREATER THAN 101.0 F  *CHILLS WITH OR WITHOUT FEVER  NAUSEA AND VOMITING THAT IS NOT CONTROLLED WITH YOUR NAUSEA MEDICATION  *UNUSUAL SHORTNESS OF BREATH  *UNUSUAL BRUISING OR BLEEDING  TENDERNESS IN MOUTH AND THROAT WITH OR WITHOUT PRESENCE OF ULCERS  *URINARY PROBLEMS  *BOWEL PROBLEMS  UNUSUAL RASH Items with * indicate a potential emergency and should be followed up as soon as possible. If you have an emergency after office hours please contact your primary care physician or go to the nearest emergency department.  Please call the clinic during office hours if you have any questions or concerns.   You may also contact the Patient Navigator at [phone] should you have any questions or need assistance in obtaining follow up care.      Resources For Cancer Patients and their Caregivers ? American Cancer Society: Can assist with transportation, wigs, general needs, runs Look Good Feel Better.        1-888-227-6333 ? Cancer Care: Provides financial assistance, online support groups, medication/co-pay assistance.  1-800-813-HOPE  (4673) ? [facility] Assists [place] cancer patients and their families through emotional , educational and financial support.  336-427-4357 ? [place] DSS Where to apply for food stamps, Medicaid and utility assistance. 336-342-1394 ? [facility]: Transportation to medical appointments. 336-347-2287 ? Social Security Administration: May apply for disability if have a Stage IV cancer. 336-342-7796 1-800-772-1213 ? [place] Aging, Disability and Transit Services: Assists with nutrition, care and transit needs. 336-349-2343         

## 2019-12-09 NOTE — Patient Instructions (Signed)
[facility] at [facility] Discharge Instructions  You were seen today by Dr. Katragadda. He went over your recent lab results. He will see you back in  for labs and follow up.   Thank you for choosing [facility] at [facility] to provide your oncology and hematology care.  To afford each patient quality time with our provider, please arrive at least 15 minutes before your scheduled appointment time.   If you have a lab appointment with the Cancer Center please come in thru the  Main Entrance and check in at the main information desk  You need to re-schedule your appointment should you arrive 10 or more minutes late.  We strive to give you quality time with our providers, and arriving late affects you and other patients whose appointments are after yours.  Also, if you no show three or more times for appointments you may be dismissed from the clinic at the providers discretion.     Again, thank you for choosing [facility].  Our hope is that these requests will decrease the amount of time that you wait before being seen by our physicians.       _____________________________________________________________  Should you have questions after your visit to [facility], please contact our office at [phone] between the hours of 8:00 a.m. and 4:30 p.m.  Voicemails left after 4:00 p.m. will not be returned until the following business day.  For prescription refill requests, have your pharmacy contact our office and allow 72 hours.    Cancer Center Support Programs:   > Cancer Support Group  2nd Tuesday of the month 1pm-2pm, Journey Room    

## 2019-12-10 NOTE — Assessment & Plan Note (Signed)
1.  Metastatic right breast cancer to the skin, ER/PR negative, HER-2 positive: -Initial presentation with skin involvement of the right breast, axillary region and posterior chest wall. -Skin punch biopsy on 07/28/2018 in the right breast positive for DLI. -12 cycles of weekly Taxol and Herceptin from 07/31/2018-10/30/2018 with PET scan showing progression. -13 cycles of Kadcyla from 02/15/2019 through 12/09/2019.   -Treatment dose of Kadcyla decreased to 2.4 mg/kg on 11/03/2019. -Last treatment was held because of elevated total bilirubin.  She has been doing very well. -I reviewed her LFTs which showed total bilirubin 1.1.  We will continue Kadcyla at the same dose of 2.4 mg/kg. -Her CBC is also grossly within normal limits.  I plan to repeat PET scan prior to next visit in 3 weeks..  2.  High risk drug monitoring: -Reviewed echo from 09/13/2019 which shows EF 65-70%. -We will consider repeating it in 6 months or if symptomatic.  3.  Hypokalemia: -She will continue potassium 20 mEq daily at home.  Potassium today is 3.5.  4.  Skin rash: -Rash in the mid back region has improved.  She is applying udder cream.

## 2019-12-21 ENCOUNTER — Other Ambulatory Visit (HOSPITAL_COMMUNITY): Payer: Self-pay | Admitting: Nurse Practitioner

## 2019-12-21 DIAGNOSIS — C50411 Malignant neoplasm of upper-outer quadrant of right female breast: Secondary | ICD-10-CM

## 2019-12-27 ENCOUNTER — Other Ambulatory Visit (HOSPITAL_COMMUNITY): Payer: Medicare HMO

## 2019-12-30 ENCOUNTER — Other Ambulatory Visit (HOSPITAL_COMMUNITY): Payer: Medicare HMO

## 2019-12-30 ENCOUNTER — Ambulatory Visit (HOSPITAL_COMMUNITY): Payer: Medicare HMO | Admitting: Hematology

## 2019-12-30 ENCOUNTER — Ambulatory Visit (HOSPITAL_COMMUNITY): Payer: Medicare HMO

## 2020-01-03 DIAGNOSIS — I951 Orthostatic hypotension: Secondary | ICD-10-CM | POA: Diagnosis not present

## 2020-01-04 ENCOUNTER — Other Ambulatory Visit: Payer: Self-pay

## 2020-01-04 ENCOUNTER — Ambulatory Visit (HOSPITAL_COMMUNITY)
Admission: RE | Admit: 2020-01-04 | Discharge: 2020-01-04 | Disposition: A | Discharge status: Home / Self Care | Payer: Medicare HMO | Source: Ambulatory Visit | Attending: Nurse Practitioner | Admitting: Nurse Practitioner

## 2020-01-04 DIAGNOSIS — C50919 Malignant neoplasm of unspecified site of unspecified female breast: Secondary | ICD-10-CM | POA: Diagnosis not present

## 2020-01-04 DIAGNOSIS — C50411 Malignant neoplasm of upper-outer quadrant of right female breast: Secondary | ICD-10-CM | POA: Diagnosis not present

## 2020-01-04 DIAGNOSIS — K5730 Diverticulosis of large intestine without perforation or abscess without bleeding: Secondary | ICD-10-CM | POA: Diagnosis not present

## 2020-01-04 DIAGNOSIS — Z171 Estrogen receptor negative status [ER-]: Secondary | ICD-10-CM | POA: Diagnosis present

## 2020-01-04 MED ORDER — IOHEXOL 300 MG/ML  SOLN
75.0000 mL | Freq: Once | INTRAMUSCULAR | Status: AC | PRN
  Administered 2020-01-04: 75 mL via INTRAVENOUS

## 2020-01-06 ENCOUNTER — Inpatient Hospital Stay (HOSPITAL_COMMUNITY): Payer: Medicare HMO

## 2020-01-06 ENCOUNTER — Inpatient Hospital Stay (HOSPITAL_COMMUNITY): Payer: Medicare HMO | Attending: Hematology

## 2020-01-06 ENCOUNTER — Encounter (HOSPITAL_COMMUNITY): Payer: Self-pay | Admitting: Hematology

## 2020-01-06 ENCOUNTER — Inpatient Hospital Stay (HOSPITAL_BASED_OUTPATIENT_CLINIC_OR_DEPARTMENT_OTHER): Payer: Medicare HMO | Admitting: Hematology

## 2020-01-06 ENCOUNTER — Other Ambulatory Visit: Payer: Self-pay

## 2020-01-06 VITALS — BP 151/67 | HR 89 | Temp 97.9°F | Resp 18

## 2020-01-06 DIAGNOSIS — Z5112 Encounter for antineoplastic immunotherapy: Secondary | ICD-10-CM | POA: Insufficient documentation

## 2020-01-06 DIAGNOSIS — C50411 Malignant neoplasm of upper-outer quadrant of right female breast: Secondary | ICD-10-CM | POA: Diagnosis not present

## 2020-01-06 DIAGNOSIS — I10 Essential (primary) hypertension: Secondary | ICD-10-CM | POA: Diagnosis not present

## 2020-01-06 DIAGNOSIS — R21 Rash and other nonspecific skin eruption: Secondary | ICD-10-CM | POA: Diagnosis not present

## 2020-01-06 DIAGNOSIS — Z171 Estrogen receptor negative status [ER-]: Secondary | ICD-10-CM | POA: Diagnosis not present

## 2020-01-06 DIAGNOSIS — Z9221 Personal history of antineoplastic chemotherapy: Secondary | ICD-10-CM | POA: Insufficient documentation

## 2020-01-06 DIAGNOSIS — E876 Hypokalemia: Secondary | ICD-10-CM | POA: Diagnosis not present

## 2020-01-06 DIAGNOSIS — Z79899 Other long term (current) drug therapy: Secondary | ICD-10-CM | POA: Insufficient documentation

## 2020-01-06 DIAGNOSIS — K219 Gastro-esophageal reflux disease without esophagitis: Secondary | ICD-10-CM | POA: Diagnosis not present

## 2020-01-06 DIAGNOSIS — Z7982 Long term (current) use of aspirin: Secondary | ICD-10-CM | POA: Insufficient documentation

## 2020-01-06 DIAGNOSIS — E785 Hyperlipidemia, unspecified: Secondary | ICD-10-CM | POA: Insufficient documentation

## 2020-01-06 LAB — CBC WITH DIFFERENTIAL/PLATELET
Abs Immature Granulocytes: 0.01 10*3/uL (ref 0.00–0.07)
Basophils Absolute: 0.1 10*3/uL (ref 0.0–0.1)
Basophils Relative: 1 %
Eosinophils Absolute: 0.7 10*3/uL — ABNORMAL HIGH (ref 0.0–0.5)
Eosinophils Relative: 10 %
HCT: 30.8 % — ABNORMAL LOW (ref 36.0–46.0)
Hemoglobin: 10 g/dL — ABNORMAL LOW (ref 12.0–15.0)
Immature Granulocytes: 0 %
Lymphocytes Relative: 35 %
Lymphs Abs: 2.5 10*3/uL (ref 0.7–4.0)
MCH: 28.2 pg (ref 26.0–34.0)
MCHC: 32.5 g/dL (ref 30.0–36.0)
MCV: 86.8 fL (ref 80.0–100.0)
Monocytes Absolute: 0.9 10*3/uL (ref 0.1–1.0)
Monocytes Relative: 13 %
Neutro Abs: 2.9 10*3/uL (ref 1.7–7.7)
Neutrophils Relative %: 41 %
Platelets: 197 10*3/uL (ref 150–400)
RBC: 3.55 MIL/uL — ABNORMAL LOW (ref 3.87–5.11)
RDW: 18.8 % — ABNORMAL HIGH (ref 11.5–15.5)
WBC: 7.1 10*3/uL (ref 4.0–10.5)
nRBC: 0 % (ref 0.0–0.2)

## 2020-01-06 LAB — COMPREHENSIVE METABOLIC PANEL
ALT: 26 U/L (ref 0–44)
AST: 57 U/L — ABNORMAL HIGH (ref 15–41)
Albumin: 2.8 g/dL — ABNORMAL LOW (ref 3.5–5.0)
Alkaline Phosphatase: 120 U/L (ref 38–126)
Anion gap: 9 (ref 5–15)
BUN: 16 mg/dL (ref 8–23)
CO2: 21 mmol/L — ABNORMAL LOW (ref 22–32)
Calcium: 8.9 mg/dL (ref 8.9–10.3)
Chloride: 106 mmol/L (ref 98–111)
Creatinine, Ser: 1.25 mg/dL — ABNORMAL HIGH (ref 0.44–1.00)
GFR calc Af Amer: 45 mL/min — ABNORMAL LOW (ref >60)
GFR calc non Af Amer: 39 mL/min — ABNORMAL LOW (ref >60)
Glucose, Bld: 108 mg/dL — ABNORMAL HIGH (ref 70–99)
Potassium: 3.3 mmol/L — ABNORMAL LOW (ref 3.5–5.1)
Sodium: 136 mmol/L (ref 135–145)
Total Bilirubin: 1.1 mg/dL (ref 0.3–1.2)
Total Protein: 6.9 g/dL (ref 6.5–8.1)

## 2020-01-06 MED ORDER — ACETAMINOPHEN 325 MG PO TABS
650.0000 mg | ORAL_TABLET | Freq: Once | ORAL | Status: AC
  Administered 2020-01-06: 650 mg via ORAL
  Filled 2020-01-06: qty 2

## 2020-01-06 MED ORDER — SODIUM CHLORIDE 0.9 % IV SOLN: Freq: Once | INTRAVENOUS | Status: AC

## 2020-01-06 MED ORDER — HEPARIN SOD (PORK) LOCK FLUSH 100 UNIT/ML IV SOLN
500.0000 [IU] | Freq: Once | INTRAVENOUS | Status: AC | PRN
  Administered 2020-01-06: 500 [IU]

## 2020-01-06 MED ORDER — SODIUM CHLORIDE 0.9% FLUSH
10.0000 mL | INTRAVENOUS | Status: DC | PRN
  Administered 2020-01-06: 10 mL

## 2020-01-06 MED ORDER — SODIUM CHLORIDE 0.9 % IV SOLN
2.4000 mg/kg | Freq: Once | INTRAVENOUS | Status: AC
  Administered 2020-01-06: 180 mg via INTRAVENOUS
  Filled 2020-01-06: qty 9

## 2020-01-06 MED ORDER — DIPHENHYDRAMINE HCL 25 MG PO CAPS
50.0000 mg | ORAL_CAPSULE | Freq: Once | ORAL | Status: AC
  Administered 2020-01-06: 50 mg via ORAL
  Filled 2020-01-06: qty 2

## 2020-01-06 NOTE — Progress Notes (Signed)
1112 Labs reviewed with and pt seen by Dr.Katragadda and pt approved for Kadcyla infusion today per MD                                   [name] tolerated Kadcyla infusion well without complaints or incident. VSS upon discharge. Pt discharged self ambulatory in satisfactory condition assisted by her husband

## 2020-01-06 NOTE — Patient Instructions (Signed)
[facility] at [facility] Discharge Instructions  You were seen today by Dr. [name]. He went over your recent lab and scan results. He will see you back in 4 weeks for labs, treatment and follow up.   Thank you for choosing [facility] at [facility] to provide your oncology and hematology care.  To afford each patient quality time with our provider, please arrive at least 15 minutes before your scheduled appointment time.   If you have a lab appointment with the [facility] please come in thru the  Main Entrance and check in at the main information desk  You need to re-schedule your appointment should you arrive 10 or more minutes late.  We strive to give you quality time with our providers, and arriving late affects you and other patients whose appointments are after yours.  Also, if you no show three or more times for appointments you may be dismissed from the clinic at the providers discretion.     Again, thank you for choosing [facility].  Our hope is that these requests will decrease the amount of time that you wait before being seen by our physicians.       _____________________________________________________________  Should you have questions after your visit to [facility], please contact our office at [phone] between the hours of 8:00 a.m. and 4:30 p.m.  Voicemails left after 4:00 p.m. will not be returned until the following business day.  For prescription refill requests, have your pharmacy contact our office and allow 72 hours.    Cancer Center Support Programs:   > Cancer Support Group  2nd Tuesday of the month 1pm-2pm, Journey Room

## 2020-01-06 NOTE — Assessment & Plan Note (Signed)
1.  Metastatic right breast cancer to the skin, ER/PR negative, HER-2 positive: -Initial presentation with skin involvement of right breast, axillary region and posterior chest wall. -Skin punch biopsy on 07/28/2018 on the right breast is positive for DLI. -12 cycles of weekly Taxol and Herceptin from 07/31/2018-10/30/2018 with PET scan showing progression. -13 cycles of Kadcyla from 02/15/2019 through 12/09/2019. -Kadcyla decreased to 2.4 mg/kg on 11/03/2019 due to elevated total bilirubin. -We reviewed results of the CT scan CAP from 01/04/2020 which showed right breast mass measuring 2.6 cm and stable.  No other evidence of metastatic disease. -I have reviewed her labs today.  Total bilirubin is 1.1. -She will continue Kadcyla at low-dose.  I will switch her treatments to every 4 weeks as she is not able to get treatments every 3 weeks.  2.  High risk drug monitoring: -Last echo from 09/13/2019 shows EF 65 to 70%.  She does not have any symptoms of clinical heart failure. -We will consider repeating it in 6 months or soon if symptomatic.  3.  Hypokalemia: -She will continue potassium 20 mEq daily at home.  Potassium today is 3.3.  4.  Skin rash: -She has skin rash in the lower back region from scratching.  She is applying steroid cream.

## 2020-01-06 NOTE — Progress Notes (Signed)
[facility] [place], [place] [address]   CLINIC:  Medical Oncology/Hematology  PCP:  [name], MD [address] [place] [address] [phone]   REASON FOR VISIT: Follow-up for right breast cancer, ER-/PR-/HER2+  CURRENT THERAPY: Kadcyla every 3 weeks   BRIEF ONCOLOGIC HISTORY:  Oncology History  Malignant neoplasm of upper-outer quadrant of right breast in female, estrogen receptor negative ([place])  07/03/2018 Initial Diagnosis   Palpable right breast mass for several months with skin discoloration, clinically skin trabecular thickening with nipple retraction measuring 7.2 x 4.3 x 4.2 cm, additional mass 4 o'clock position 2.6 cm, right axillary tail 1.2 cm right axillary lymph node 1.8 cm left breast irregular mass 1 cm, adjacent mass 0.9 cm together measuring 1.9 cm, no lymphadenopathy   07/03/2018 Pathology Results   Right breast biopsy: IDC grade 2, ER 0%, PR 0%, HER-2 positive, Ki-67 40%, lymph node biopsy negative, left breast biopsy complex sclerosing lesion    07/15/2018 Cancer Staging   Staging form: Breast, AJCC 8th Edition - Clinical: Stage IIB (cT3, cN0, cM0, G2, ER-, PR-, HER2+) - Signed by [name], MD on 07/15/2018   07/31/2018 - 10/30/2018 Chemotherapy   The patient had trastuzumab (HERCEPTIN) 336 mg in sodium chloride 0.9 % 250 mL chemo infusion, 4 mg/kg = 336 mg, Intravenous,  Once, 4 of 4 cycles Administration: 336 mg (07/31/2018), 168 mg (08/07/2018), 168 mg (08/28/2018), 168 mg (08/14/2018), 168 mg (08/21/2018), 168 mg (09/04/2018), 168 mg (09/11/2018), 168 mg (09/18/2018), 168 mg (10/02/2018), 168 mg (10/09/2018), 168 mg (10/16/2018), 168 mg (10/23/2018), 168 mg (10/30/2018) PACLitaxel (TAXOL) 78 mg in sodium chloride 0.9 % 250 mL chemo infusion (</= 55m/m2), 40 mg/m2 = 78 mg (50 % of original dose 80 mg/m2), Intravenous,  Once, 4 of 4 cycles Dose modification: 40 mg/m2 (50 % of original dose 80 mg/m2, Cycle 1, Reason: Patient  Age), 53.3333 mg/m2 (66.7 % of original dose 80 mg/m2, Cycle 1, Reason: Provider Judgment), 45 mg/m2 (original dose 80 mg/m2, Cycle 3, Reason: Provider Judgment) Administration: 78 mg (07/31/2018), 102 mg (08/07/2018), 102 mg (08/28/2018), 102 mg (08/14/2018), 102 mg (08/21/2018), 102 mg (09/04/2018), 102 mg (09/18/2018), 84 mg (10/02/2018), 84 mg (10/09/2018), 84 mg (10/16/2018), 84 mg (10/23/2018), 84 mg (10/30/2018)  for chemotherapy treatment.    11/13/2018 - 02/14/2019 Chemotherapy   The patient had trastuzumab (HERCEPTIN) 504 mg in sodium chloride 0.9 % 250 mL chemo infusion, 6 mg/kg = 504 mg (100 % of original dose 6 mg/kg), Intravenous,  Once, 4 of 5 cycles Dose modification: 6 mg/kg (original dose 6 mg/kg, Cycle 1, Reason: Other (see comments), Comment: pt receving it ) Administration: 504 mg (11/13/2018), 504 mg (12/04/2018), 504 mg (01/04/2019), 504 mg (01/25/2019)  for chemotherapy treatment.    02/15/2019 -  Chemotherapy   The patient had ado-trastuzumab emtansine (KADCYLA) 240 mg in sodium chloride 0.9 % 250 mL chemo infusion, 3 mg/kg = 240 mg (100 % of original dose 3 mg/kg), Intravenous, Once, 14 of 15 cycles Dose modification: 3 mg/kg (original dose 3 mg/kg, Cycle 1, Reason: Provider Judgment), 3 mg/kg (original dose 3 mg/kg, Cycle 2, Reason: Provider Judgment), 2.4 mg/kg (original dose 3 mg/kg, Cycle 11, Reason: Other (see comments), Comment: bili upper limit of normal), 2.4 mg/kg (original dose 3 mg/kg, Cycle 12, Reason: Other (see comments), Comment: elevated bilirubin) Administration: 240 mg (02/15/2019), 240 mg (03/08/2019), 240 mg (05/10/2019), 240 mg (03/29/2019), 240 mg (04/19/2019), 240 mg (06/01/2019), 240 mg (06/22/2019), 240  mg (07/20/2019), 240 mg (08/10/2019), 240 mg (09/01/2019), 200 mg (09/22/2019), 180 mg (11/03/2019), 180 mg (12/09/2019), 180 mg (01/06/2020)  for chemotherapy treatment.       CANCER STAGING: Cancer Staging Malignant neoplasm of upper-outer quadrant of right breast in  female, estrogen receptor negative ([place]) Staging form: Breast, AJCC 8th Edition - Clinical: Stage IIB (cT3, cN0, cM0, G2, ER-, PR-, HER2+) - Signed by [name], MD on 07/15/2018    INTERVAL HISTORY:  Ms. Paladino 86 y.o. female seen for follow-up of metastatic HER-2 positive breast cancer.  She is accompanied by her husband.  She reports appetite of 100%.  Energy levels are 50%.  No new onset pains reported.  Itching and rash in the lower back is well controlled with steroid cream.  Denies any fevers or chills.  No signs or symptoms of PND or orthopnea.  REVIEW OF SYSTEMS:  Review of Systems  All other systems reviewed and are negative.    PAST MEDICAL/SURGICAL HISTORY:  Past Medical History:  Diagnosis Date  . Cancer ([place]) 06/2018   right breast cancer  . GERD (gastroesophageal reflux disease)    occasional   . Hyperlipidemia   . Hypertension    clearance with note Dr Nevada Crane on chart  . Pneumonia    2 years ago/ states occ cough with sinus drainage- no fever  . Thyroid nodule    with biopsy- states following medically   Past Surgical History:  Procedure Laterality Date  . CATARACT EXTRACTION W/PHACO  11/30/2012   Procedure: CATARACT EXTRACTION PHACO AND INTRAOCULAR LENS PLACEMENT (IOC);  Surgeon: Williams Che, MD;  Location: [facility];  Service: Ophthalmology;  Laterality: Left;  CDE:11.49  . CATARACT EXTRACTION W/PHACO Right 03/15/2013   Procedure: CATARACT EXTRACTION PHACO AND INTRAOCULAR LENS PLACEMENT (IOC);  Surgeon: Williams Che, MD;  Location: [facility];  Service: Ophthalmology;  Laterality: Right;  CDE 16.15  . COLONOSCOPY    . EXCISION OF SKIN TAG Right 06/17/2016   Procedure: SHAVE OF SKIN TAG RIGHT BUTTOCK;  Surgeon: Aviva Signs, MD;  Location: [facility];  Service: General;  Laterality: Right;  . JOINT REPLACEMENT     left knee  . MASS EXCISION Left 06/17/2016   Procedure: EXCISION SKIN MALIGNANT LESION LEFT BUTTOCK;  Surgeon: Aviva Signs, MD;  Location: [facility];   Service: General;  Laterality: Left;  . PORTACATH PLACEMENT Right 07/28/2018   Procedure: INSERTION PORT-A-CATH WITH ULTRASOUND;  Surgeon: Rolm Bookbinder, MD;  Location: [facility];  Service: General;  Laterality: Right;  . PUNCH BIOPSY OF SKIN Right 07/28/2018   Procedure: PUNCH BIOPSY OF SKIN RIGHT BREAST;  Surgeon: Rolm Bookbinder, MD;  Location: [facility];  Service: General;  Laterality: Right;  . TOTAL KNEE ARTHROPLASTY  12/16/2011   Procedure: TOTAL KNEE ARTHROPLASTY;  Surgeon: Gearlean Alf, MD;  Location: [facility];  Service: Orthopedics;  Laterality: Right;  . TUBAL LIGATION       SOCIAL HISTORY:  Social History   Socioeconomic History  . Marital status: Married    Spouse name: Not on file  . Number of children: 2  . Years of education: some business school after HS  . Highest education level: Not on file  Occupational History  . Occupation: Cabin crew  . Occupation: Network engineer   Tobacco Use  . Smoking status: Never Smoker  . Smokeless tobacco: Never Used  Substance and Sexual Activity  . Alcohol use: No  . Drug use: No  . Sexual activity: Yes  Birth control/protection: None  Other Topics Concern  . Not on file  Social History Narrative   Lives at home with her husband.   4 cups caffeine per day.   Right-handed.   Social Determinants of Health   Financial Resource Strain:   . Difficulty of Paying Living Expenses:   Food Insecurity:   . Worried About Charity fundraiser in the Last Year:   . Arboriculturist in the Last Year:   Transportation Needs:   . Film/video editor (Medical):   Marland Kitchen Lack of Transportation (Non-Medical):   Physical Activity:   . Days of Exercise per Week:   . Minutes of Exercise per Session:   Stress:   . Feeling of Stress :   Social Connections:   . Frequency of Communication with Friends and Family:   . Frequency of Social Gatherings with Friends and Family:   . Attends Religious Services:   .  Active Member of Clubs or Organizations:   . Attends Archivist Meetings:   Marland Kitchen Marital Status:   Intimate Partner Violence:   . Fear of Current or Ex-Partner:   . Emotionally Abused:   Marland Kitchen Physically Abused:   . Sexually Abused:     FAMILY HISTORY:  Family History  Problem Relation Age of Onset  . Heart attack Mother   . Bronchitis Father   . Diabetes Sister   . Leukemia Brother   . Lung disease Brother   . Lung disease Sister     CURRENT MEDICATIONS:  Outpatient Encounter Medications as of 01/06/2020  Medication Sig  . aspirin 325 MG tablet Take 162.5 mg by mouth daily.   . cetirizine (ZYRTEC) 10 MG tablet Take 10 mg by mouth daily.  . hydrocortisone 1 % ointment Apply 1 application topically 2 (two) times daily.  Marland Kitchen lidocaine-prilocaine (EMLA) cream Apply to port site one hour prior to appointment and cover with plastic wrap.  . Misc. Devices MISC Please provide lymphedema sleeve for right arm  . Multiple Vitamin (MULTIVITAMIN WITH MINERALS) TABS tablet Take 1 tablet by mouth daily.  . Omega-3 Fatty Acids (FISH OIL PO) Take 1 capsule by mouth daily.   Glory Rosebush ULTRA test strip USE 1 STRIP TO CHECK GLUCOSE 2 TO 3 TIMES DAILY  . PACLitaxel (TAXOL IV) Inject into the vein once a week.  . potassium chloride SA (KLOR-CON) 20 MEQ tablet Take 1 tablet (20 mEq total) by mouth daily.  . Trastuzumab (HERCEPTIN IV) Inject into the vein once a week.   Marland Kitchen acetaminophen (TYLENOL) 325 MG tablet Take 650 mg by mouth every 6 (six) hours as needed.   No facility-administered encounter medications on file as of 01/06/2020.    ALLERGIES:  Allergies  Allergen Reactions  . Penicillins Rash    Has patient had a PCN reaction causing immediate rash, facial/tongue/throat swelling, SOB or lightheadedness with hypotension: No Has patient had a PCN reaction causing severe rash involving mucus membranes or skin necrosis: No Has patient had a PCN reaction that required hospitalization: No Has  patient had a PCN reaction occurring within the last 10 years: No If all of the above answers are "NO", then may proceed with Cephalosporin use.      PHYSICAL EXAM:  ECOG Performance status: 1  Vitals:   01/06/20 0959  BP: (!) 152/60  Pulse: 96  Resp: 18  Temp: (!) 97.1 F (36.2 C)  SpO2: 100%   Filed Weights   01/06/20 0959  Weight: 162  lb 9.6 oz (73.8 kg)    Physical Exam Constitutional:      Appearance: Normal appearance. She is normal weight.  Cardiovascular:     Rate and Rhythm: Normal rate and regular rhythm.     Heart sounds: Normal heart sounds.  Pulmonary:     Effort: Pulmonary effort is normal.     Breath sounds: Normal breath sounds.  Abdominal:     General: Bowel sounds are normal.     Palpations: Abdomen is soft.  Musculoskeletal:        General: Normal range of motion.  Skin:    General: Skin is warm and dry.  Neurological:     Mental Status: She is alert and oriented to person, place, and time. Mental status is at baseline.  Psychiatric:        Mood and Affect: Mood normal.        Behavior: Behavior normal.        Thought Content: Thought content normal.        Judgment: Judgment normal.   Left breast mass is stable.  No adenopathy.  No rash on the right breast or posterior chest wall.  Mild erythema from scratching in the lower back region.   LABORATORY DATA:  I have reviewed the labs as listed.  CBC    Component Value Date/Time   WBC 7.1 01/06/2020 1009   RBC 3.55 (L) 01/06/2020 1009   HGB 10.0 (L) 01/06/2020 1009   HGB 12.7 11/24/2017 1626   HCT 30.8 (L) 01/06/2020 1009   HCT 36.6 11/24/2017 1626   PLT 197 01/06/2020 1009   PLT 420 (H) 11/24/2017 1626   MCV 86.8 01/06/2020 1009   MCV 88 11/24/2017 1626   MCH 28.2 01/06/2020 1009   MCHC 32.5 01/06/2020 1009   RDW 18.8 (H) 01/06/2020 1009   RDW 15.4 11/24/2017 1626   LYMPHSABS 2.5 01/06/2020 1009   LYMPHSABS 2.5 11/24/2017 1626   MONOABS 0.9 01/06/2020 1009   EOSABS 0.7 (H)  01/06/2020 1009   EOSABS 0.1 11/24/2017 1626   BASOSABS 0.1 01/06/2020 1009   BASOSABS 0.0 11/24/2017 1626   CMP Latest Ref Rng & Units 01/06/2020 12/09/2019 11/24/2019  Glucose 70 - 99 mg/dL 108(H) 112(H) 108(H)  BUN 8 - 23 mg/dL _0 Creatinine 0.44 - 1.00 mg/dL 1.25(H) 1.28(H) 1.23(H)  Sodium 135 - 145 mmol/L 136 137 135  Potassium 3.5 - 5.1 mmol/L 3.3(L) 3.5 3.3(L)  Chloride 98 - 111 mmol/L 106 108 104  CO2 22 - 32 mmol/L 21(L) 21(L) 20(L)  Calcium 8.9 - 10.3 mg/dL 8.9 9.0 9.0  Total Protein 6.5 - 8.1 g/dL 6.9 7.0 6.9  Total Bilirubin 0.3 - 1.2 mg/dL 1.1 1.1 1.3(H)  Alkaline Phos 38 - 126 U/L 120 114 124  AST 15 - 41 U/L 57(H) 57(H) 75(H)  ALT 0 - 44 U/L 26 29 35       DIAGNOSTIC IMAGING:  I have independently reviewed the scans.    ASSESSMENT & PLAN:   Malignant neoplasm of upper-outer quadrant of right breast in female, estrogen receptor negative ([place]) 1.  Metastatic right breast cancer to the skin, ER/PR negative, HER-2 positive: -Initial presentation with skin involvement of right breast, axillary region and posterior chest wall. -Skin punch biopsy on 07/28/2018 on the right breast is positive for DLI. -12 cycles of weekly Taxol and Herceptin from 07/31/2018-10/30/2018 with PET scan showing progression. -13 cycles of Kadcyla from 02/15/2019 through 12/09/2019. -Kadcyla decreased to 2.4 mg/kg on 11/03/2019  due to elevated total bilirubin. -We reviewed results of the CT scan CAP from 01/04/2020 which showed right breast mass measuring 2.6 cm and stable.  No other evidence of metastatic disease. -I have reviewed her labs today.  Total bilirubin is 1.1. -She will continue Kadcyla at low-dose.  I will switch her treatments to every 4 weeks as she is not able to get treatments every 3 weeks.  2.  High risk drug monitoring: -Last echo from 09/13/2019 shows EF 65 to 70%.  She does not have any symptoms of clinical heart failure. -We will consider repeating it in 6 months or soon  if symptomatic.  3.  Hypokalemia: -She will continue potassium 20 mEq daily at home.  Potassium today is 3.3.  4.  Skin rash: -She has skin rash in the lower back region from scratching.  She is applying steroid cream.      Orders placed this encounter:  No orders of the defined types were placed in this encounter.     Derek Jack, MD [place] [phone]

## 2020-01-06 NOTE — Progress Notes (Signed)
Patient has been assessed, vital signs and labs have been reviewed by Dr. [name]. ANC, Creatinine, LFTs, and Platelets are within treatment parameters per Dr. [name]. The patient is good to proceed with treatment at this time. He will be changing treatment to every 4 weeks instead of every 3.

## 2020-01-06 NOTE — Patient Instructions (Signed)
[facility] Discharge Instructions for Patients Receiving Chemotherapy   Beginning January 23rd 2017 lab work for the [facility] will be done in the  Main lab at [facility] on 1st floor. If you have a lab appointment with the [facility] please come in thru the  Main Entrance and check in at the main information desk   Today you received the following chemotherapy agents Kadcyla infusion. Follow-up as scheduled. Call clinic for any questions or concerns  To help prevent nausea and vomiting after your treatment, we encourage you to take your nausea medication   If you develop nausea and vomiting, or diarrhea that is not controlled by your medication, call the clinic.  The clinic phone number is [phone]. Office hours are Monday-Friday 8:30am-5:00pm.  BELOW ARE SYMPTOMS THAT SHOULD BE REPORTED IMMEDIATELY:  *FEVER GREATER THAN 101.0 F  *CHILLS WITH OR WITHOUT FEVER  NAUSEA AND VOMITING THAT IS NOT CONTROLLED WITH YOUR NAUSEA MEDICATION  *UNUSUAL SHORTNESS OF BREATH  *UNUSUAL BRUISING OR BLEEDING  TENDERNESS IN MOUTH AND THROAT WITH OR WITHOUT PRESENCE OF ULCERS  *URINARY PROBLEMS  *BOWEL PROBLEMS  UNUSUAL RASH Items with * indicate a potential emergency and should be followed up as soon as possible. If you have an emergency after office hours please contact your primary care physician or go to the nearest emergency department.  Please call the clinic during office hours if you have any questions or concerns.   You may also contact the Patient Navigator at [phone] should you have any questions or need assistance in obtaining follow up care.      Resources For Cancer Patients and their Caregivers ? American Cancer Society: Can assist with transportation, wigs, general needs, runs Look Good Feel Better.        502-733-3388 ? Cancer Care: Provides financial assistance, online support groups, medication/co-pay assistance.  1-800-813-HOPE  (616)040-5067) ? [facility] Assists [place] cancer patients and their families through emotional , educational and financial support.  (403)169-3361 ? [place] DSS Where to apply for food stamps, Medicaid and utility assistance. 4126863128 ? [facility]: Transportation to medical appointments. 3653484684 ? Social Security Administration: May apply for disability if have a Stage IV cancer. 782-573-2195 930-416-7257 ? [employer], Disability and Transit Services: Assists with nutrition, care and transit needs. [phone]

## 2020-01-10 DIAGNOSIS — R69 Illness, unspecified: Secondary | ICD-10-CM | POA: Diagnosis not present

## 2020-01-13 DIAGNOSIS — J060 Acute laryngopharyngitis: Secondary | ICD-10-CM | POA: Diagnosis not present

## 2020-01-13 DIAGNOSIS — J309 Allergic rhinitis, unspecified: Secondary | ICD-10-CM | POA: Diagnosis not present

## 2020-01-13 DIAGNOSIS — L539 Erythematous condition, unspecified: Secondary | ICD-10-CM | POA: Diagnosis not present

## 2020-01-13 DIAGNOSIS — N6459 Other signs and symptoms in breast: Secondary | ICD-10-CM | POA: Diagnosis not present

## 2020-01-13 DIAGNOSIS — R2231 Localized swelling, mass and lump, right upper limb: Secondary | ICD-10-CM | POA: Diagnosis not present

## 2020-01-13 DIAGNOSIS — R945 Abnormal results of liver function studies: Secondary | ICD-10-CM | POA: Diagnosis not present

## 2020-01-13 DIAGNOSIS — Z0000 Encounter for general adult medical examination without abnormal findings: Secondary | ICD-10-CM | POA: Diagnosis not present

## 2020-01-13 DIAGNOSIS — R2240 Localized swelling, mass and lump, unspecified lower limb: Secondary | ICD-10-CM | POA: Diagnosis not present

## 2020-01-13 DIAGNOSIS — Z6828 Body mass index (BMI) 28.0-28.9, adult: Secondary | ICD-10-CM | POA: Diagnosis not present

## 2020-01-13 DIAGNOSIS — Z6830 Body mass index (BMI) 30.0-30.9, adult: Secondary | ICD-10-CM | POA: Diagnosis not present

## 2020-01-19 DIAGNOSIS — R2243 Localized swelling, mass and lump, lower limb, bilateral: Secondary | ICD-10-CM | POA: Diagnosis not present

## 2020-01-19 DIAGNOSIS — Z0001 Encounter for general adult medical examination with abnormal findings: Secondary | ICD-10-CM | POA: Diagnosis not present

## 2020-01-19 DIAGNOSIS — E663 Overweight: Secondary | ICD-10-CM | POA: Diagnosis not present

## 2020-01-19 DIAGNOSIS — Z6828 Body mass index (BMI) 28.0-28.9, adult: Secondary | ICD-10-CM | POA: Diagnosis not present

## 2020-01-19 DIAGNOSIS — L539 Erythematous condition, unspecified: Secondary | ICD-10-CM | POA: Diagnosis not present

## 2020-01-19 DIAGNOSIS — E782 Mixed hyperlipidemia: Secondary | ICD-10-CM | POA: Diagnosis not present

## 2020-01-19 DIAGNOSIS — I129 Hypertensive chronic kidney disease with stage 1 through stage 4 chronic kidney disease, or unspecified chronic kidney disease: Secondary | ICD-10-CM | POA: Diagnosis not present

## 2020-01-19 DIAGNOSIS — R945 Abnormal results of liver function studies: Secondary | ICD-10-CM | POA: Diagnosis not present

## 2020-01-19 DIAGNOSIS — C50911 Malignant neoplasm of unspecified site of right female breast: Secondary | ICD-10-CM | POA: Diagnosis not present

## 2020-01-19 DIAGNOSIS — R7301 Impaired fasting glucose: Secondary | ICD-10-CM | POA: Diagnosis not present

## 2020-01-19 DIAGNOSIS — R2240 Localized swelling, mass and lump, unspecified lower limb: Secondary | ICD-10-CM | POA: Diagnosis not present

## 2020-01-19 DIAGNOSIS — D631 Anemia in chronic kidney disease: Secondary | ICD-10-CM | POA: Diagnosis not present

## 2020-01-19 DIAGNOSIS — E1121 Type 2 diabetes mellitus with diabetic nephropathy: Secondary | ICD-10-CM | POA: Diagnosis not present

## 2020-01-19 DIAGNOSIS — N1832 Chronic kidney disease, stage 3b: Secondary | ICD-10-CM | POA: Diagnosis not present

## 2020-01-19 DIAGNOSIS — E559 Vitamin D deficiency, unspecified: Secondary | ICD-10-CM | POA: Diagnosis not present

## 2020-01-19 DIAGNOSIS — S31829A Unspecified open wound of left buttock, initial encounter: Secondary | ICD-10-CM | POA: Diagnosis not present

## 2020-01-19 DIAGNOSIS — R7303 Prediabetes: Secondary | ICD-10-CM | POA: Diagnosis not present

## 2020-01-19 DIAGNOSIS — N182 Chronic kidney disease, stage 2 (mild): Secondary | ICD-10-CM | POA: Diagnosis not present

## 2020-01-31 NOTE — Progress Notes (Signed)
.  Pharmacist Chemotherapy Monitoring - Follow Up Assessment    I verify that I have reviewed each item in the below checklist:  . Regimen for the patient is scheduled for the appropriate day and plan matches scheduled date. Marland Kitchen Appropriate non-routine labs are ordered dependent on drug ordered. . If applicable, additional medications reviewed and ordered per protocol based on lifetime cumulative doses and/or treatment regimen.   Plan for follow-up and/or issues identified: No . I-vent associated with next due treatment: No . MD and/or nursing notified: No  . ECHO soon  Wynona Neat 01/31/2020 4:21 PM

## 2020-02-03 ENCOUNTER — Inpatient Hospital Stay (HOSPITAL_COMMUNITY): Payer: Medicare HMO

## 2020-02-03 ENCOUNTER — Other Ambulatory Visit: Payer: Self-pay

## 2020-02-03 ENCOUNTER — Inpatient Hospital Stay (HOSPITAL_COMMUNITY): Payer: Medicare HMO | Attending: Hematology | Admitting: Hematology

## 2020-02-03 ENCOUNTER — Encounter (HOSPITAL_COMMUNITY): Payer: Self-pay | Admitting: Hematology

## 2020-02-03 VITALS — BP 145/67 | HR 97 | Resp 16

## 2020-02-03 VITALS — BP 160/74 | HR 99 | Temp 97.1°F | Resp 18 | Wt 160.8 lb

## 2020-02-03 DIAGNOSIS — Z79899 Other long term (current) drug therapy: Secondary | ICD-10-CM | POA: Insufficient documentation

## 2020-02-03 DIAGNOSIS — I10 Essential (primary) hypertension: Secondary | ICD-10-CM | POA: Insufficient documentation

## 2020-02-03 DIAGNOSIS — Z9221 Personal history of antineoplastic chemotherapy: Secondary | ICD-10-CM | POA: Diagnosis not present

## 2020-02-03 DIAGNOSIS — C50411 Malignant neoplasm of upper-outer quadrant of right female breast: Secondary | ICD-10-CM | POA: Diagnosis not present

## 2020-02-03 DIAGNOSIS — R21 Rash and other nonspecific skin eruption: Secondary | ICD-10-CM | POA: Insufficient documentation

## 2020-02-03 DIAGNOSIS — E079 Disorder of thyroid, unspecified: Secondary | ICD-10-CM | POA: Diagnosis not present

## 2020-02-03 DIAGNOSIS — Z5112 Encounter for antineoplastic immunotherapy: Secondary | ICD-10-CM | POA: Insufficient documentation

## 2020-02-03 DIAGNOSIS — Z171 Estrogen receptor negative status [ER-]: Secondary | ICD-10-CM | POA: Diagnosis not present

## 2020-02-03 DIAGNOSIS — Z7982 Long term (current) use of aspirin: Secondary | ICD-10-CM | POA: Diagnosis not present

## 2020-02-03 DIAGNOSIS — E876 Hypokalemia: Secondary | ICD-10-CM | POA: Insufficient documentation

## 2020-02-03 DIAGNOSIS — C792 Secondary malignant neoplasm of skin: Secondary | ICD-10-CM | POA: Diagnosis not present

## 2020-02-03 LAB — CBC WITH DIFFERENTIAL/PLATELET
Abs Immature Granulocytes: 0.01 K/uL (ref 0.00–0.07)
Basophils Absolute: 0.1 K/uL (ref 0.0–0.1)
Basophils Relative: 1 %
Eosinophils Absolute: 1.3 K/uL — ABNORMAL HIGH (ref 0.0–0.5)
Eosinophils Relative: 15 %
HCT: 30.2 % — ABNORMAL LOW (ref 36.0–46.0)
Hemoglobin: 9.8 g/dL — ABNORMAL LOW (ref 12.0–15.0)
Immature Granulocytes: 0 %
Lymphocytes Relative: 41 %
Lymphs Abs: 3.6 K/uL (ref 0.7–4.0)
MCH: 28.3 pg (ref 26.0–34.0)
MCHC: 32.5 g/dL (ref 30.0–36.0)
MCV: 87.3 fL (ref 80.0–100.0)
Monocytes Absolute: 1 K/uL (ref 0.1–1.0)
Monocytes Relative: 12 %
Neutro Abs: 2.7 K/uL (ref 1.7–7.7)
Neutrophils Relative %: 31 %
Platelets: 204 K/uL (ref 150–400)
RBC: 3.46 MIL/uL — ABNORMAL LOW (ref 3.87–5.11)
RDW: 19.4 % — ABNORMAL HIGH (ref 11.5–15.5)
WBC: 8.7 K/uL (ref 4.0–10.5)
nRBC: 0 % (ref 0.0–0.2)

## 2020-02-03 LAB — COMPREHENSIVE METABOLIC PANEL
ALT: 35 U/L (ref 0–44)
AST: 75 U/L — ABNORMAL HIGH (ref 15–41)
Albumin: 2.9 g/dL — ABNORMAL LOW (ref 3.5–5.0)
Alkaline Phosphatase: 139 U/L — ABNORMAL HIGH (ref 38–126)
Anion gap: 11 (ref 5–15)
BUN: 22 mg/dL (ref 8–23)
CO2: 20 mmol/L — ABNORMAL LOW (ref 22–32)
Calcium: 9.1 mg/dL (ref 8.9–10.3)
Chloride: 105 mmol/L (ref 98–111)
Creatinine, Ser: 1.22 mg/dL — ABNORMAL HIGH (ref 0.44–1.00)
GFR calc Af Amer: 46 mL/min — ABNORMAL LOW (ref >60)
GFR calc non Af Amer: 40 mL/min — ABNORMAL LOW (ref >60)
Glucose, Bld: 101 mg/dL — ABNORMAL HIGH (ref 70–99)
Potassium: 3.4 mmol/L — ABNORMAL LOW (ref 3.5–5.1)
Sodium: 136 mmol/L (ref 135–145)
Total Bilirubin: 1.2 mg/dL (ref 0.3–1.2)
Total Protein: 6.7 g/dL (ref 6.5–8.1)

## 2020-02-03 MED ORDER — ACETAMINOPHEN 325 MG PO TABS
ORAL_TABLET | ORAL | Status: AC
  Filled 2020-02-03: qty 2

## 2020-02-03 MED ORDER — HEPARIN SOD (PORK) LOCK FLUSH 100 UNIT/ML IV SOLN
500.0000 [IU] | Freq: Once | INTRAVENOUS | Status: AC | PRN
  Administered 2020-02-03: 500 [IU]

## 2020-02-03 MED ORDER — SODIUM CHLORIDE 0.9 % IV SOLN
2.4000 mg/kg | Freq: Once | INTRAVENOUS | Status: AC
  Administered 2020-02-03: 12:00:00 180 mg via INTRAVENOUS
  Filled 2020-02-03: qty 9

## 2020-02-03 MED ORDER — SODIUM CHLORIDE 0.9% FLUSH
10.0000 mL | INTRAVENOUS | Status: DC | PRN
  Administered 2020-02-03: 11:00:00 10 mL

## 2020-02-03 MED ORDER — DIPHENHYDRAMINE HCL 25 MG PO CAPS
50.0000 mg | ORAL_CAPSULE | Freq: Once | ORAL | Status: AC
  Administered 2020-02-03: 50 mg via ORAL

## 2020-02-03 MED ORDER — SODIUM CHLORIDE 0.9 % IV SOLN: Freq: Once | INTRAVENOUS | Status: AC

## 2020-02-03 MED ORDER — DIPHENHYDRAMINE HCL 25 MG PO CAPS
ORAL_CAPSULE | ORAL | Status: AC
  Filled 2020-02-03: qty 2

## 2020-02-03 MED ORDER — ACETAMINOPHEN 325 MG PO TABS
650.0000 mg | ORAL_TABLET | Freq: Once | ORAL | Status: AC
  Administered 2020-02-03: 11:00:00 650 mg via ORAL

## 2020-02-03 NOTE — Patient Instructions (Signed)
[facility] Discharge Instructions for Patients Receiving Chemotherapy   Beginning January 23rd 2017 lab work for the Cancer Center will be done in the  Main lab at [facility] on 1st floor. If you have a lab appointment with the Cancer Center please come in thru the  Main Entrance and check in at the main information desk   Today you received the following chemotherapy agents Kadcyla  To help prevent nausea and vomiting after your treatment, we encourage you to take your nausea medication     If you develop nausea and vomiting, or diarrhea that is not controlled by your medication, call the clinic.  The clinic phone number is [phone]. Office hours are Monday-Friday 8:30am-5:00pm.  BELOW ARE SYMPTOMS THAT SHOULD BE REPORTED IMMEDIATELY:  *FEVER GREATER THAN 101.0 F  *CHILLS WITH OR WITHOUT FEVER  NAUSEA AND VOMITING THAT IS NOT CONTROLLED WITH YOUR NAUSEA MEDICATION  *UNUSUAL SHORTNESS OF BREATH  *UNUSUAL BRUISING OR BLEEDING  TENDERNESS IN MOUTH AND THROAT WITH OR WITHOUT PRESENCE OF ULCERS  *URINARY PROBLEMS  *BOWEL PROBLEMS  UNUSUAL RASH Items with * indicate a potential emergency and should be followed up as soon as possible. If you have an emergency after office hours please contact your primary care physician or go to the nearest emergency department.  Please call the clinic during office hours if you have any questions or concerns.   You may also contact the Patient Navigator at [phone] should you have any questions or need assistance in obtaining follow up care.      Resources For Cancer Patients and their Caregivers ? American Cancer Society: Can assist with transportation, wigs, general needs, runs Look Good Feel Better.        1-888-227-6333 ? Cancer Care: Provides financial assistance, online support groups, medication/co-pay assistance.  1-800-813-HOPE (4673) ? [facility] Assists [place]  cancer patients and their families through emotional , educational and financial support.  336-427-4357 ? [place] DSS Where to apply for food stamps, Medicaid and utility assistance. 336-342-1394 ? RCATS: Transportation to medical appointments. 336-347-2287 ? Social Security Administration: May apply for disability if have a Stage IV cancer. 336-342-7796 1-800-772-1213 ? [place] [facility], Disability and Transit Services: Assists with nutrition, care and transit needs. 336-349-2343         

## 2020-02-03 NOTE — Patient Instructions (Addendum)
[facility] Discharge Instructions  You were seen today by Dr. Katragadda. He went over your recent lab results. He will see you back in 4 weeks for labs, treatment and follow up.   Thank you for choosing [facility] at [facility] to provide your oncology and hematology care.  To afford each patient quality time with our provider, please arrive at least 15 minutes before your scheduled appointment time.   If you have a lab appointment with the Cancer Center please come in thru the  Main Entrance and check in at the main information desk  You need to re-schedule your appointment should you arrive 10 or more minutes late.  We strive to give you quality time with our providers, and arriving late affects you and other patients whose appointments are after yours.  Also, if you no show three or more times for appointments you may be dismissed from the clinic at the providers discretion.     Again, thank you for choosing [facility].  Our hope is that these requests will decrease the amount of time that you wait before being seen by our physicians.       _____________________________________________________________  Should you have questions after your visit to [facility], please contact our office at [phone] between the hours of 8:00 a.m. and 4:30 p.m.  Voicemails left after 4:00 p.m. will not be returned until the following business day.  For prescription refill requests, have your pharmacy contact our office and allow 72 hours.    Cancer Center Support Programs:   > Cancer Support Group  2nd Tuesday of the month 1pm-2pm, Journey Room    

## 2020-02-03 NOTE — Progress Notes (Signed)
1040- Labs and vitals reviewed, patient seen by Dr. [name] who approved patient for treatment today. Pt reports no changes since last treatment.  Graecyn Pinkelman Streat tolerated Kadcyla without incident or complaint. VSS. Discharged in satisfactory condition in company of husband.

## 2020-02-03 NOTE — Progress Notes (Signed)
Patient has been assessed, vital signs and labs have been reviewed by Dr. Katragadda. ANC, Creatinine, LFTs, and Platelets are within treatment parameters per Dr. Katragadda. The patient is good to proceed with treatment at this time.  

## 2020-02-03 NOTE — Progress Notes (Signed)
[name] [address] [place] [address]   CLINIC:  Medical Oncology/Hematology  PCP:  Adrienne Squibb, MD [place] [address] [phone]   REASON FOR VISIT: HER-2 positive right breast cancer to the skin  CURRENT THERAPY: Kadcyla every 4 weeks.  BRIEF ONCOLOGIC HISTORY:  Oncology History  Malignant neoplasm of upper-outer quadrant of right breast in female, estrogen receptor negative ([place])  07/03/2018 Initial Diagnosis   Palpable right breast mass for several months with skin discoloration, clinically skin trabecular thickening with nipple retraction measuring 7.2 x 4.3 x 4.2 cm, additional mass 4 o'clock position 2.6 cm, right axillary tail 1.2 cm right axillary lymph node 1.8 cm left breast irregular mass 1 cm, adjacent mass 0.9 cm together measuring 1.9 cm, no lymphadenopathy   07/03/2018 Pathology Results   Right breast biopsy: IDC grade 2, ER 0%, PR 0%, HER-2 positive, Ki-67 40%, lymph node biopsy negative, left breast biopsy complex sclerosing lesion    07/15/2018 Cancer Staging   Staging form: Breast, AJCC 8th Edition - Clinical: Stage IIB (cT3, cN0, cM0, G2, ER-, PR-, HER2+) - Signed by [name], MD on 07/15/2018   07/31/2018 - 10/30/2018 Chemotherapy   The patient had trastuzumab (HERCEPTIN) 336 mg in sodium chloride 0.9 % 250 mL chemo infusion, 4 mg/kg = 336 mg, Intravenous,  Once, 4 of 4 cycles Administration: 336 mg (07/31/2018), 168 mg (08/07/2018), 168 mg (08/28/2018), 168 mg (08/14/2018), 168 mg (08/21/2018), 168 mg (09/04/2018), 168 mg (09/11/2018), 168 mg (09/18/2018), 168 mg (10/02/2018), 168 mg (10/09/2018), 168 mg (10/16/2018), 168 mg (10/23/2018), 168 mg (10/30/2018) PACLitaxel (TAXOL) 78 mg in sodium chloride 0.9 % 250 mL chemo infusion (</= 39m/m2), 40 mg/m2 = 78 mg (50 % of original dose 80 mg/m2), Intravenous,  Once, 4 of 4 cycles Dose modification: 40 mg/m2 (50 % of original dose 80 mg/m2, Cycle 1, Reason: Patient  Age), 53.3333 mg/m2 (66.7 % of original dose 80 mg/m2, Cycle 1, Reason: Provider Judgment), 45 mg/m2 (original dose 80 mg/m2, Cycle 3, Reason: Provider Judgment) Administration: 78 mg (07/31/2018), 102 mg (08/07/2018), 102 mg (08/28/2018), 102 mg (08/14/2018), 102 mg (08/21/2018), 102 mg (09/04/2018), 102 mg (09/18/2018), 84 mg (10/02/2018), 84 mg (10/09/2018), 84 mg (10/16/2018), 84 mg (10/23/2018), 84 mg (10/30/2018)  for chemotherapy treatment.    11/13/2018 - 02/14/2019 Chemotherapy   The patient had trastuzumab (HERCEPTIN) 504 mg in sodium chloride 0.9 % 250 mL chemo infusion, 6 mg/kg = 504 mg (100 % of original dose 6 mg/kg), Intravenous,  Once, 4 of 5 cycles Dose modification: 6 mg/kg (original dose 6 mg/kg, Cycle 1, Reason: Other (see comments), Comment: pt receving it ) Administration: 504 mg (11/13/2018), 504 mg (12/04/2018), 504 mg (01/04/2019), 504 mg (01/25/2019)  for chemotherapy treatment.    02/15/2019 -  Chemotherapy   The patient had ado-trastuzumab emtansine (KADCYLA) 240 mg in sodium chloride 0.9 % 250 mL chemo infusion, 3 mg/kg = 240 mg (100 % of original dose 3 mg/kg), Intravenous, Once, 15 of 17 cycles Dose modification: 3 mg/kg (original dose 3 mg/kg, Cycle 1, Reason: Provider Judgment), 3 mg/kg (original dose 3 mg/kg, Cycle 2, Reason: Provider Judgment), 2.4 mg/kg (original dose 3 mg/kg, Cycle 11, Reason: Other (see comments), Comment: bili upper limit of normal), 2.4 mg/kg (original dose 3 mg/kg, Cycle 12, Reason: Other (see comments), Comment: elevated bilirubin) Administration: 240 mg (02/15/2019), 240 mg (03/08/2019), 240 mg (05/10/2019), 240 mg (03/29/2019), 240 mg (04/19/2019), 240 mg (06/01/2019), 240 mg (06/22/2019),  240 mg (07/20/2019), 240 mg (08/10/2019), 240 mg (09/01/2019), 200 mg (09/22/2019), 180 mg (11/03/2019), 180 mg (12/09/2019), 180 mg (01/06/2020), 180 mg (02/03/2020)  for chemotherapy treatment.       CANCER STAGING: Cancer Staging Malignant neoplasm of upper-outer quadrant of  right breast in female, estrogen receptor negative ([place]) Staging form: Breast, AJCC 8th Edition - Clinical: Stage IIB (cT3, cN0, cM0, G2, ER-, PR-, HER2+) - Signed by [name], MD on 07/15/2018    INTERVAL HISTORY:  [name] 86 y.o. female seen for follow-up and toxicity assessment prior to next cycle of Kadcyla.  Appetite is reported as 100%.  Reports itching in the back region.  Numbness in the extremities is stable.  Energy levels are reported as 80%.  She is able to do all her day-to-day activities.  Denies any symptoms of PND or orthopnea.  No fevers or infections reported.  REVIEW OF SYSTEMS:  Review of Systems  Neurological: Positive for numbness.  All other systems reviewed and are negative.    PAST MEDICAL/SURGICAL HISTORY:  Past Medical History:  Diagnosis Date  . Cancer ([place]) 06/2018   right breast cancer  . GERD (gastroesophageal reflux disease)    occasional   . Hyperlipidemia   . Hypertension    clearance with note Dr Nevada Crane on chart  . Pneumonia    2 years ago/ states occ cough with sinus drainage- no fever  . Thyroid nodule    with biopsy- states following medically   Past Surgical History:  Procedure Laterality Date  . CATARACT EXTRACTION W/PHACO  11/30/2012   Procedure: CATARACT EXTRACTION PHACO AND INTRAOCULAR LENS PLACEMENT (IOC);  Surgeon: Williams Che, MD;  Location: [facility];  Service: Ophthalmology;  Laterality: Left;  CDE:11.49  . CATARACT EXTRACTION W/PHACO Right 03/15/2013   Procedure: CATARACT EXTRACTION PHACO AND INTRAOCULAR LENS PLACEMENT (IOC);  Surgeon: Williams Che, MD;  Location: [facility];  Service: Ophthalmology;  Laterality: Right;  CDE 16.15  . COLONOSCOPY    . EXCISION OF SKIN TAG Right 06/17/2016   Procedure: SHAVE OF SKIN TAG RIGHT BUTTOCK;  Surgeon: Aviva Signs, MD;  Location: [facility];  Service: General;  Laterality: Right;  . JOINT REPLACEMENT     left knee  . MASS EXCISION Left 06/17/2016   Procedure: EXCISION SKIN MALIGNANT  LESION LEFT BUTTOCK;  Surgeon: Aviva Signs, MD;  Location: [facility];  Service: General;  Laterality: Left;  . PORTACATH PLACEMENT Right 07/28/2018   Procedure: INSERTION PORT-A-CATH WITH ULTRASOUND;  Surgeon: Rolm Bookbinder, MD;  Location: [facility];  Service: General;  Laterality: Right;  . PUNCH BIOPSY OF SKIN Right 07/28/2018   Procedure: PUNCH BIOPSY OF SKIN RIGHT BREAST;  Surgeon: Rolm Bookbinder, MD;  Location: [facility];  Service: General;  Laterality: Right;  . TOTAL KNEE ARTHROPLASTY  12/16/2011   Procedure: TOTAL KNEE ARTHROPLASTY;  Surgeon: Gearlean Alf, MD;  Location: [facility];  Service: Orthopedics;  Laterality: Right;  . TUBAL LIGATION       SOCIAL HISTORY:  Social History   Socioeconomic History  . Marital status: Married    Spouse name: Not on file  . Number of children: 2  . Years of education: some business school after HS  . Highest education level: Not on file  Occupational History  . Occupation: Cabin crew  . Occupation: Network engineer   Tobacco Use  . Smoking status: Never Smoker  . Smokeless tobacco: Never Used  Substance and Sexual Activity  . Alcohol use: No  .  Drug use: No  . Sexual activity: Yes    Birth control/protection: None  Other Topics Concern  . Not on file  Social History Narrative   Lives at home with her husband.   4 cups caffeine per day.   Right-handed.   Social Determinants of Health   Financial Resource Strain:   . Difficulty of Paying Living Expenses:   Food Insecurity:   . Worried About Charity fundraiser in the Last Year:   . Arboriculturist in the Last Year:   Transportation Needs:   . Film/video editor (Medical):   Marland Kitchen Lack of Transportation (Non-Medical):   Physical Activity:   . Days of Exercise per Week:   . Minutes of Exercise per Session:   Stress:   . Feeling of Stress :   Social Connections:   . Frequency of Communication with Friends and Family:   . Frequency of Social  Gatherings with Friends and Family:   . Attends Religious Services:   . Active Member of Clubs or Organizations:   . Attends Archivist Meetings:   Marland Kitchen Marital Status:   Intimate Partner Violence:   . Fear of Current or Ex-Partner:   . Emotionally Abused:   Marland Kitchen Physically Abused:   . Sexually Abused:     FAMILY HISTORY:  Family History  Problem Relation Age of Onset  . Heart attack Mother   . Bronchitis Father   . Diabetes Sister   . Leukemia Brother   . Lung disease Brother   . Lung disease Sister     CURRENT MEDICATIONS:  Outpatient Encounter Medications as of 02/03/2020  Medication Sig  . acetaminophen (TYLENOL) 325 MG tablet Take 650 mg by mouth every 6 (six) hours as needed.  Marland Kitchen aspirin 325 MG tablet Take 162.5 mg by mouth daily.   . cetirizine (ZYRTEC) 10 MG tablet Take 10 mg by mouth daily.  . hydrocortisone 1 % ointment Apply 1 application topically 2 (two) times daily.  Marland Kitchen lidocaine-prilocaine (EMLA) cream Apply to port site one hour prior to appointment and cover with plastic wrap.  . Misc. Devices MISC Please provide lymphedema sleeve for right arm  . Multiple Vitamin (MULTIVITAMIN WITH MINERALS) TABS tablet Take 1 tablet by mouth daily.  . Omega-3 Fatty Acids (FISH OIL PO) Take 1 capsule by mouth daily.   Glory Rosebush ULTRA test strip USE 1 STRIP TO CHECK GLUCOSE 2 TO 3 TIMES DAILY  . PACLitaxel (TAXOL IV) Inject into the vein once a week.  . potassium chloride SA (KLOR-CON) 20 MEQ tablet Take 1 tablet (20 mEq total) by mouth daily.  . Trastuzumab (HERCEPTIN IV) Inject into the vein once a week.    No facility-administered encounter medications on file as of 02/03/2020.    ALLERGIES:  Allergies  Allergen Reactions  . Penicillins Rash    Has patient had a PCN reaction causing immediate rash, facial/tongue/throat swelling, SOB or lightheadedness with hypotension: No Has patient had a PCN reaction causing severe rash involving mucus membranes or skin necrosis:  No Has patient had a PCN reaction that required hospitalization: No Has patient had a PCN reaction occurring within the last 10 years: No If all of the above answers are "NO", then may proceed with Cephalosporin use.      PHYSICAL EXAM:  ECOG Performance status: 1  Vitals:   02/03/20 0943  BP: (!) 160/74  Pulse: 99  Resp: 18  Temp: (!) 97.1 F (36.2 C)  SpO2: 100%  Filed Weights   02/03/20 0943  Weight: 160 lb 12.8 oz (72.9 kg)    Physical Exam Constitutional:      Appearance: Normal appearance. She is normal weight.  Cardiovascular:     Rate and Rhythm: Normal rate and regular rhythm.     Heart sounds: Normal heart sounds.  Pulmonary:     Effort: Pulmonary effort is normal.     Breath sounds: Normal breath sounds.  Abdominal:     General: Bowel sounds are normal.     Palpations: Abdomen is soft.  Musculoskeletal:        General: Normal range of motion.  Skin:    General: Skin is warm and dry.  Neurological:     Mental Status: She is alert and oriented to person, place, and time. Mental status is at baseline.  Psychiatric:        Mood and Affect: Mood normal.        Behavior: Behavior normal.        Thought Content: Thought content normal.        Judgment: Judgment normal.   Right breast mass is in the upper outer quadrant and stable.  No skin involvement with erythema in the right breast or posterior chest wall region.  No palpable adenopathy.   LABORATORY DATA:  I have reviewed the labs as listed.  CBC    Component Value Date/Time   WBC 8.7 02/03/2020 0955   RBC 3.46 (L) 02/03/2020 0955   HGB 9.8 (L) 02/03/2020 0955   HGB 12.7 11/24/2017 1626   HCT 30.2 (L) 02/03/2020 0955   HCT 36.6 11/24/2017 1626   PLT 204 02/03/2020 0955   PLT 420 (H) 11/24/2017 1626   MCV 87.3 02/03/2020 0955   MCV 88 11/24/2017 1626   MCH 28.3 02/03/2020 0955   MCHC 32.5 02/03/2020 0955   RDW 19.4 (H) 02/03/2020 0955   RDW 15.4 11/24/2017 1626   LYMPHSABS 3.6 02/03/2020  0955   LYMPHSABS 2.5 11/24/2017 1626   MONOABS 1.0 02/03/2020 0955   EOSABS 1.3 (H) 02/03/2020 0955   EOSABS 0.1 11/24/2017 1626   BASOSABS 0.1 02/03/2020 0955   BASOSABS 0.0 11/24/2017 1626   CMP Latest Ref Rng & Units 02/03/2020 01/06/2020 12/09/2019  Glucose 70 - 99 mg/dL 101(H) 108(H) 112(H)  BUN 8 - 23 mg/dL 22 16 22   Creatinine 0.44 - 1.00 mg/dL 1.22(H) 1.25(H) 1.28(H)  Sodium 135 - 145 mmol/L 136 136 137  Potassium 3.5 - 5.1 mmol/L 3.4(L) 3.3(L) 3.5  Chloride 98 - 111 mmol/L 105 106 108  CO2 22 - 32 mmol/L 20(L) 21(L) 21(L)  Calcium 8.9 - 10.3 mg/dL 9.1 8.9 9.0  Total Protein 6.5 - 8.1 g/dL 6.7 6.9 7.0  Total Bilirubin 0.3 - 1.2 mg/dL 1.2 1.1 1.1  Alkaline Phos 38 - 126 U/L 139(H) 120 114  AST 15 - 41 U/L 75(H) 57(H) 57(H)  ALT 0 - 44 U/L 35 26 29       DIAGNOSTIC IMAGING:  I have reviewed the scans.    ASSESSMENT & PLAN:   Malignant neoplasm of upper-outer quadrant of right breast in female, estrogen receptor negative ([place]) 1.  Metastatic right breast cancer to the skin, ER/PR negative, HER-2 positive: -Presentation with skin involvement of the right breast, axillary region and posterior chest wall skin. -12 cycles of weekly Taxol and Herceptin from 07/31/2018-10/30/2018 with PET scan showing progression. -14 cycles of Kadcyla from 02/15/2019 through 01/06/2020.  Kadcyla dose decreased to 2.4 mg/kg on 11/03/2019 due  to elevated total bilirubin.  Kadcyla frequency increased to every 4 weeks during last cycle on 01/06/2020. -CT CAP on 01/04/2020 showed right breast mass measuring 2.6 cm and stable.  No other evidence of metastatic disease. -We reviewed her labs.  White count is 8.7 and platelet count is 204.  Hemoglobin is 9.8.  Total bilirubin is 1.2.  AST is elevated at 75. -Today she will continue low-dose of Kadcyla.  She will come back in 4 weeks for follow-up.  2.  High risk drug monitoring: -Echo on 09/13/2019 shows EF 65 to 70%. -She does not have any symptoms of PND or  orthopnea.  We will plan to repeat it in 6 months or sooner if symptomatic.  3.  Hypokalemia: -Potassium today is 3.4.  She will continue potassium 20 mEq daily at home.  4.  Skin rash: -She has dry skin in the upper back resulting in itching.  She will use hydrocortisone cream.      Orders placed this encounter:  Orders Placed This Encounter  Procedures  . CBC with Differential  . Comprehensive metabolic panel      Derek Jack, MD [place] [phone]

## 2020-02-03 NOTE — Patient Instructions (Signed)
[facility] at [facility] Discharge Instructions  Labs drawn from portacath today   Thank you for choosing [facility] at [facility] to provide your oncology and hematology care.  To afford each patient quality time with our provider, please arrive at least 15 minutes before your scheduled appointment time.   If you have a lab appointment with the Cancer Center please come in thru the Main Entrance and check in at the main information desk.  You need to re-schedule your appointment should you arrive 10 or more minutes late.  We strive to give you quality time with our providers, and arriving late affects you and other patients whose appointments are after yours.  Also, if you no show three or more times for appointments you may be dismissed from the clinic at the providers discretion.     Again, thank you for choosing [facility].  Our hope is that these requests will decrease the amount of time that you wait before being seen by our physicians.       _____________________________________________________________  Should you have questions after your visit to [facility], please contact our office at [phone] between the hours of 8:00 a.m. and 4:30 p.m.  Voicemails left after 4:00 p.m. will not be returned until the following business day.  For prescription refill requests, have your pharmacy contact our office and allow 72 hours.    Due to Covid, you will need to wear a mask upon entering the hospital. If you do not have a mask, a mask will be given to you at the Main Entrance upon arrival. For doctor visits, patients may have 1 support person with them. For treatment visits, patients can not have anyone with them due to social distancing guidelines and our immunocompromised population.     

## 2020-02-04 ENCOUNTER — Encounter (HOSPITAL_COMMUNITY): Payer: Self-pay | Admitting: Hematology

## 2020-02-04 NOTE — Assessment & Plan Note (Signed)
1.  Metastatic right breast cancer to the skin, ER/PR negative, HER-2 positive: -Presentation with skin involvement of the right breast, axillary region and posterior chest wall skin. -12 cycles of weekly Taxol and Herceptin from 07/31/2018-10/30/2018 with PET scan showing progression. -14 cycles of Kadcyla from 02/15/2019 through 01/06/2020.  Kadcyla dose decreased to 2.4 mg/kg on 11/03/2019 due to elevated total bilirubin.  Kadcyla frequency increased to every 4 weeks during last cycle on 01/06/2020. -CT CAP on 01/04/2020 showed right breast mass measuring 2.6 cm and stable.  No other evidence of metastatic disease. -We reviewed her labs.  White count is 8.7 and platelet count is 204.  Hemoglobin is 9.8.  Total bilirubin is 1.2.  AST is elevated at 75. -Today she will continue low-dose of Kadcyla.  She will come back in 4 weeks for follow-up.  2.  High risk drug monitoring: -Echo on 09/13/2019 shows EF 65 to 70%. -She does not have any symptoms of PND or orthopnea.  We will plan to repeat it in 6 months or sooner if symptomatic.  3.  Hypokalemia: -Potassium today is 3.4.  She will continue potassium 20 mEq daily at home.  4.  Skin rash: -She has dry skin in the upper back resulting in itching.  She will use hydrocortisone cream. 

## 2020-02-16 DIAGNOSIS — E1121 Type 2 diabetes mellitus with diabetic nephropathy: Secondary | ICD-10-CM | POA: Diagnosis not present

## 2020-02-16 DIAGNOSIS — K219 Gastro-esophageal reflux disease without esophagitis: Secondary | ICD-10-CM | POA: Diagnosis not present

## 2020-02-16 DIAGNOSIS — I10 Essential (primary) hypertension: Secondary | ICD-10-CM | POA: Diagnosis not present

## 2020-02-16 DIAGNOSIS — E782 Mixed hyperlipidemia: Secondary | ICD-10-CM | POA: Diagnosis not present

## 2020-03-02 ENCOUNTER — Inpatient Hospital Stay (HOSPITAL_COMMUNITY): Payer: Medicare HMO | Attending: Hematology

## 2020-03-02 ENCOUNTER — Inpatient Hospital Stay (HOSPITAL_BASED_OUTPATIENT_CLINIC_OR_DEPARTMENT_OTHER): Payer: Medicare HMO | Admitting: Hematology

## 2020-03-02 ENCOUNTER — Encounter (HOSPITAL_COMMUNITY): Payer: Self-pay | Admitting: Hematology

## 2020-03-02 ENCOUNTER — Inpatient Hospital Stay (HOSPITAL_COMMUNITY): Payer: Medicare HMO

## 2020-03-02 ENCOUNTER — Other Ambulatory Visit: Payer: Self-pay

## 2020-03-02 DIAGNOSIS — C50411 Malignant neoplasm of upper-outer quadrant of right female breast: Secondary | ICD-10-CM

## 2020-03-02 DIAGNOSIS — R21 Rash and other nonspecific skin eruption: Secondary | ICD-10-CM | POA: Diagnosis not present

## 2020-03-02 DIAGNOSIS — Z171 Estrogen receptor negative status [ER-]: Secondary | ICD-10-CM | POA: Diagnosis not present

## 2020-03-02 DIAGNOSIS — E785 Hyperlipidemia, unspecified: Secondary | ICD-10-CM | POA: Diagnosis not present

## 2020-03-02 DIAGNOSIS — R5383 Other fatigue: Secondary | ICD-10-CM | POA: Insufficient documentation

## 2020-03-02 DIAGNOSIS — Z806 Family history of leukemia: Secondary | ICD-10-CM | POA: Diagnosis not present

## 2020-03-02 DIAGNOSIS — K219 Gastro-esophageal reflux disease without esophagitis: Secondary | ICD-10-CM | POA: Insufficient documentation

## 2020-03-02 DIAGNOSIS — E876 Hypokalemia: Secondary | ICD-10-CM | POA: Diagnosis not present

## 2020-03-02 DIAGNOSIS — I10 Essential (primary) hypertension: Secondary | ICD-10-CM | POA: Diagnosis not present

## 2020-03-02 DIAGNOSIS — Z79899 Other long term (current) drug therapy: Secondary | ICD-10-CM | POA: Insufficient documentation

## 2020-03-02 DIAGNOSIS — Z7982 Long term (current) use of aspirin: Secondary | ICD-10-CM | POA: Insufficient documentation

## 2020-03-02 DIAGNOSIS — Z5112 Encounter for antineoplastic immunotherapy: Secondary | ICD-10-CM | POA: Diagnosis present

## 2020-03-02 LAB — CBC WITH DIFFERENTIAL/PLATELET
Abs Immature Granulocytes: 0.02 10*3/uL (ref 0.00–0.07)
Basophils Absolute: 0.1 10*3/uL (ref 0.0–0.1)
Basophils Relative: 1 %
Eosinophils Absolute: 0.6 10*3/uL — ABNORMAL HIGH (ref 0.0–0.5)
Eosinophils Relative: 8 %
HCT: 30.6 % — ABNORMAL LOW (ref 36.0–46.0)
Hemoglobin: 9.8 g/dL — ABNORMAL LOW (ref 12.0–15.0)
Immature Granulocytes: 0 %
Lymphocytes Relative: 31 %
Lymphs Abs: 2.3 10*3/uL (ref 0.7–4.0)
MCH: 28 pg (ref 26.0–34.0)
MCHC: 32 g/dL (ref 30.0–36.0)
MCV: 87.4 fL (ref 80.0–100.0)
Monocytes Absolute: 1 10*3/uL (ref 0.1–1.0)
Monocytes Relative: 13 %
Neutro Abs: 3.4 10*3/uL (ref 1.7–7.7)
Neutrophils Relative %: 47 %
Platelets: 195 10*3/uL (ref 150–400)
RBC: 3.5 MIL/uL — ABNORMAL LOW (ref 3.87–5.11)
RDW: 19.1 % — ABNORMAL HIGH (ref 11.5–15.5)
WBC: 7.3 10*3/uL (ref 4.0–10.5)
nRBC: 0 % (ref 0.0–0.2)

## 2020-03-02 LAB — COMPREHENSIVE METABOLIC PANEL
ALT: 28 U/L (ref 0–44)
AST: 63 U/L — ABNORMAL HIGH (ref 15–41)
Albumin: 2.9 g/dL — ABNORMAL LOW (ref 3.5–5.0)
Alkaline Phosphatase: 130 U/L — ABNORMAL HIGH (ref 38–126)
Anion gap: 11 (ref 5–15)
BUN: 25 mg/dL — ABNORMAL HIGH (ref 8–23)
CO2: 20 mmol/L — ABNORMAL LOW (ref 22–32)
Calcium: 9.1 mg/dL (ref 8.9–10.3)
Chloride: 106 mmol/L (ref 98–111)
Creatinine, Ser: 1.3 mg/dL — ABNORMAL HIGH (ref 0.44–1.00)
GFR calc Af Amer: 43 mL/min — ABNORMAL LOW (ref >60)
GFR calc non Af Amer: 37 mL/min — ABNORMAL LOW (ref >60)
Glucose, Bld: 108 mg/dL — ABNORMAL HIGH (ref 70–99)
Potassium: 3.3 mmol/L — ABNORMAL LOW (ref 3.5–5.1)
Sodium: 137 mmol/L (ref 135–145)
Total Bilirubin: 1.4 mg/dL — ABNORMAL HIGH (ref 0.3–1.2)
Total Protein: 6.9 g/dL (ref 6.5–8.1)

## 2020-03-02 MED ORDER — HEPARIN SOD (PORK) LOCK FLUSH 100 UNIT/ML IV SOLN
500.0000 [IU] | Freq: Once | INTRAVENOUS | Status: AC
  Administered 2020-03-02: 500 [IU] via INTRAVENOUS

## 2020-03-02 MED ORDER — SODIUM CHLORIDE 0.9% FLUSH
10.0000 mL | INTRAVENOUS | Status: DC | PRN
  Administered 2020-03-02: 10 mL via INTRAVENOUS

## 2020-03-02 NOTE — Progress Notes (Signed)
1058 Labs reviewed with and pt seen by Dr. [name] and pt's treatment is to be held today per MD                                        [name] tolerated port lab draw with flush well without complaints or incident. Pt discharged self ambulatory in satisfactory condition with assistance from her husband

## 2020-03-02 NOTE — Patient Instructions (Addendum)
[facility] at [facility] Discharge Instructions  You were seen today by Dr. Katragadda. He went over your recent lab results. He will see you back in 1 week for labs, treatment and follow up.   Thank you for choosing [facility] at [facility] to provide your oncology and hematology care.  To afford each patient quality time with our provider, please arrive at least 15 minutes before your scheduled appointment time.   If you have a lab appointment with the Cancer Center please come in thru the  Main Entrance and check in at the main information desk  You need to re-schedule your appointment should you arrive 10 or more minutes late.  We strive to give you quality time with our providers, and arriving late affects you and other patients whose appointments are after yours.  Also, if you no show three or more times for appointments you may be dismissed from the clinic at the providers discretion.     Again, thank you for choosing [facility].  Our hope is that these requests will decrease the amount of time that you wait before being seen by our physicians.       _____________________________________________________________  Should you have questions after your visit to [facility], please contact our office at [phone] between the hours of 8:00 a.m. and 4:30 p.m.  Voicemails left after 4:00 p.m. will not be returned until the following business day.  For prescription refill requests, have your pharmacy contact our office and allow 72 hours.    Cancer Center Support Programs:   > Cancer Support Group  2nd Tuesday of the month 1pm-2pm, Journey Room    

## 2020-03-02 NOTE — Progress Notes (Signed)
[facility] [address], [place] [address]   CLINIC:  Medical Oncology/Hematology  PCP:  [name], MD [place] [address] [phone]   REASON FOR VISIT: HER-2 positive right breast cancer to the skin  CURRENT THERAPY: Kadcyla every 4 weeks.  BRIEF ONCOLOGIC HISTORY:  Oncology History  Malignant neoplasm of upper-outer quadrant of right breast in female, estrogen receptor negative ([place])  07/03/2018 Initial Diagnosis   Palpable right breast mass for several months with skin discoloration, clinically skin trabecular thickening with nipple retraction measuring 7.2 x 4.3 x 4.2 cm, additional mass 4 o'clock position 2.6 cm, right axillary tail 1.2 cm right axillary lymph node 1.8 cm left breast irregular mass 1 cm, adjacent mass 0.9 cm together measuring 1.9 cm, no lymphadenopathy   07/03/2018 Pathology Results   Right breast biopsy: IDC grade 2, ER 0%, PR 0%, HER-2 positive, Ki-67 40%, lymph node biopsy negative, left breast biopsy complex sclerosing lesion    07/15/2018 Cancer Staging   Staging form: Breast, AJCC 8th Edition - Clinical: Stage IIB (cT3, cN0, cM0, G2, ER-, PR-, HER2+) - Signed by [name], MD on 07/15/2018   07/31/2018 - 10/30/2018 Chemotherapy   The patient had trastuzumab (HERCEPTIN) 336 mg in sodium chloride 0.9 % 250 mL chemo infusion, 4 mg/kg = 336 mg, Intravenous,  Once, 4 of 4 cycles Administration: 336 mg (07/31/2018), 168 mg (08/07/2018), 168 mg (08/28/2018), 168 mg (08/14/2018), 168 mg (08/21/2018), 168 mg (09/04/2018), 168 mg (09/11/2018), 168 mg (09/18/2018), 168 mg (10/02/2018), 168 mg (10/09/2018), 168 mg (10/16/2018), 168 mg (10/23/2018), 168 mg (10/30/2018) PACLitaxel (TAXOL) 78 mg in sodium chloride 0.9 % 250 mL chemo infusion (</= 86m/m2), 40 mg/m2 = 78 mg (50 % of original dose 80 mg/m2), Intravenous,  Once, 4 of 4 cycles Dose modification: 40 mg/m2 (50 % of original dose 80 mg/m2, Cycle 1, Reason: Patient  Age), 53.3333 mg/m2 (66.7 % of original dose 80 mg/m2, Cycle 1, Reason: Provider Judgment), 45 mg/m2 (original dose 80 mg/m2, Cycle 3, Reason: Provider Judgment) Administration: 78 mg (07/31/2018), 102 mg (08/07/2018), 102 mg (08/28/2018), 102 mg (08/14/2018), 102 mg (08/21/2018), 102 mg (09/04/2018), 102 mg (09/18/2018), 84 mg (10/02/2018), 84 mg (10/09/2018), 84 mg (10/16/2018), 84 mg (10/23/2018), 84 mg (10/30/2018)  for chemotherapy treatment.    11/13/2018 - 02/14/2019 Chemotherapy   The patient had trastuzumab (HERCEPTIN) 504 mg in sodium chloride 0.9 % 250 mL chemo infusion, 6 mg/kg = 504 mg (100 % of original dose 6 mg/kg), Intravenous,  Once, 4 of 5 cycles Dose modification: 6 mg/kg (original dose 6 mg/kg, Cycle 1, Reason: Other (see comments), Comment: pt receving it ) Administration: 504 mg (11/13/2018), 504 mg (12/04/2018), 504 mg (01/04/2019), 504 mg (01/25/2019)  for chemotherapy treatment.    02/15/2019 -  Chemotherapy   The patient had ado-trastuzumab emtansine (KADCYLA) 240 mg in sodium chloride 0.9 % 250 mL chemo infusion, 3 mg/kg = 240 mg (100 % of original dose 3 mg/kg), Intravenous, Once, 15 of 17 cycles Dose modification: 3 mg/kg (original dose 3 mg/kg, Cycle 1, Reason: Provider Judgment), 3 mg/kg (original dose 3 mg/kg, Cycle 2, Reason: Provider Judgment), 2.4 mg/kg (original dose 3 mg/kg, Cycle 11, Reason: Other (see comments), Comment: bili upper limit of normal), 2.4 mg/kg (original dose 3 mg/kg, Cycle 12, Reason: Other (see comments), Comment: elevated bilirubin) Administration: 240 mg (02/15/2019), 240 mg (03/08/2019), 240 mg (05/10/2019), 240 mg (03/29/2019), 240 mg (04/19/2019), 240 mg (06/01/2019), 240 mg (06/22/2019),  240 mg (07/20/2019), 240 mg (08/10/2019), 240 mg (09/01/2019), 200 mg (09/22/2019), 180 mg (11/03/2019), 180 mg (12/09/2019), 180 mg (01/06/2020), 180 mg (02/03/2020)  for chemotherapy treatment.       CANCER STAGING: Cancer Staging Malignant neoplasm of upper-outer quadrant of  right breast in female, estrogen receptor negative ([place]) Staging form: Breast, AJCC 8th Edition - Clinical: Stage IIB (cT3, cN0, cM0, G2, ER-, PR-, HER2+) - Signed by [name], MD on 07/15/2018    INTERVAL HISTORY:  Ms. Sian 86 y.o. female seen for follow-up and x-rays when prior to next cycle of Kadcyla.  She has tolerated last treatment very well.  Denies any new onset pains.  Denies any skin rashes.  Lower back rash from dryness and itching is stable.  Appetite is 100%.  Energy levels are 50%.  No pain is reported.  No fevers or chills.  REVIEW OF SYSTEMS:  Review of Systems  All other systems reviewed and are negative.    PAST MEDICAL/SURGICAL HISTORY:  Past Medical History:  Diagnosis Date  . Cancer ([place]) 06/2018   right breast cancer  . GERD (gastroesophageal reflux disease)    occasional   . Hyperlipidemia   . Hypertension    clearance with note Dr Nevada Crane on chart  . Pneumonia    2 years ago/ states occ cough with sinus drainage- no fever  . Thyroid nodule    with biopsy- states following medically   Past Surgical History:  Procedure Laterality Date  . CATARACT EXTRACTION W/PHACO  11/30/2012   Procedure: CATARACT EXTRACTION PHACO AND INTRAOCULAR LENS PLACEMENT (IOC);  Surgeon: Williams Che, MD;  Location: [facility];  Service: Ophthalmology;  Laterality: Left;  CDE:11.49  . CATARACT EXTRACTION W/PHACO Right 03/15/2013   Procedure: CATARACT EXTRACTION PHACO AND INTRAOCULAR LENS PLACEMENT (IOC);  Surgeon: Williams Che, MD;  Location: [facility];  Service: Ophthalmology;  Laterality: Right;  CDE 16.15  . COLONOSCOPY    . EXCISION OF SKIN TAG Right 06/17/2016   Procedure: SHAVE OF SKIN TAG RIGHT BUTTOCK;  Surgeon: Aviva Signs, MD;  Location: [facility];  Service: General;  Laterality: Right;  . JOINT REPLACEMENT     left knee  . MASS EXCISION Left 06/17/2016   Procedure: EXCISION SKIN MALIGNANT LESION LEFT BUTTOCK;  Surgeon: Aviva Signs, MD;  Location: [facility];  Service:  General;  Laterality: Left;  . PORTACATH PLACEMENT Right 07/28/2018   Procedure: INSERTION PORT-A-CATH WITH ULTRASOUND;  Surgeon: Rolm Bookbinder, MD;  Location: [facility];  Service: General;  Laterality: Right;  . PUNCH BIOPSY OF SKIN Right 07/28/2018   Procedure: PUNCH BIOPSY OF SKIN RIGHT BREAST;  Surgeon: Rolm Bookbinder, MD;  Location: [facility];  Service: General;  Laterality: Right;  . TOTAL KNEE ARTHROPLASTY  12/16/2011   Procedure: TOTAL KNEE ARTHROPLASTY;  Surgeon: Gearlean Alf, MD;  Location: [facility];  Service: Orthopedics;  Laterality: Right;  . TUBAL LIGATION       SOCIAL HISTORY:  Social History   Socioeconomic History  . Marital status: Married    Spouse name: Not on file  . Number of children: 2  . Years of education: some business school after HS  . Highest education level: Not on file  Occupational History  . Occupation: Cabin crew  . Occupation: Network engineer   Tobacco Use  . Smoking status: Never Smoker  . Smokeless tobacco: Never Used  Substance and Sexual Activity  . Alcohol use: No  . Drug use: No  . Sexual activity:  Yes    Birth control/protection: None  Other Topics Concern  . Not on file  Social History Narrative   Lives at home with her husband.   4 cups caffeine per day.   Right-handed.   Social Determinants of Health   Financial Resource Strain:   . Difficulty of Paying Living Expenses:   Food Insecurity:   . Worried About Charity fundraiser in the Last Year:   . Arboriculturist in the Last Year:   Transportation Needs:   . Film/video editor (Medical):   Marland Kitchen Lack of Transportation (Non-Medical):   Physical Activity:   . Days of Exercise per Week:   . Minutes of Exercise per Session:   Stress:   . Feeling of Stress :   Social Connections:   . Frequency of Communication with Friends and Family:   . Frequency of Social Gatherings with Friends and Family:   . Attends Religious Services:   . Active  Member of Clubs or Organizations:   . Attends Archivist Meetings:   Marland Kitchen Marital Status:   Intimate Partner Violence:   . Fear of Current or Ex-Partner:   . Emotionally Abused:   Marland Kitchen Physically Abused:   . Sexually Abused:     FAMILY HISTORY:  Family History  Problem Relation Age of Onset  . Heart attack Mother   . Bronchitis Father   . Diabetes Sister   . Leukemia Brother   . Lung disease Brother   . Lung disease Sister     CURRENT MEDICATIONS:  Outpatient Encounter Medications as of 03/02/2020  Medication Sig  . aspirin 325 MG tablet Take 162.5 mg by mouth daily.   . cetirizine (ZYRTEC) 10 MG tablet Take 10 mg by mouth daily.  . hydrocortisone 1 % ointment Apply 1 application topically 2 (two) times daily.  Marland Kitchen lidocaine-prilocaine (EMLA) cream Apply to port site one hour prior to appointment and cover with plastic wrap.  . Misc. Devices MISC Please provide lymphedema sleeve for right arm  . Multiple Vitamin (MULTIVITAMIN WITH MINERALS) TABS tablet Take 1 tablet by mouth daily.  . Omega-3 Fatty Acids (FISH OIL PO) Take 1 capsule by mouth daily.   Glory Rosebush ULTRA test strip USE 1 STRIP TO CHECK GLUCOSE 2 TO 3 TIMES DAILY  . PACLitaxel (TAXOL IV) Inject into the vein once a week.  . potassium chloride SA (KLOR-CON) 20 MEQ tablet Take 1 tablet (20 mEq total) by mouth daily.  . Trastuzumab (HERCEPTIN IV) Inject into the vein once a week.   Marland Kitchen acetaminophen (TYLENOL) 325 MG tablet Take 650 mg by mouth every 6 (six) hours as needed.  . [DISCONTINUED] sodium chloride flush (NS) 0.9 % injection 10 mL    No facility-administered encounter medications on file as of 03/02/2020.    ALLERGIES:  Allergies  Allergen Reactions  . Penicillins Rash    Has patient had a PCN reaction causing immediate rash, facial/tongue/throat swelling, SOB or lightheadedness with hypotension: No Has patient had a PCN reaction causing severe rash involving mucus membranes or skin necrosis: No Has  patient had a PCN reaction that required hospitalization: No Has patient had a PCN reaction occurring within the last 10 years: No If all of the above answers are "NO", then may proceed with Cephalosporin use.      PHYSICAL EXAM:  ECOG Performance status: 1  Vitals:   03/02/20 1020 03/02/20 1022  BP: (!) 161/81 (!) 155/79  Pulse: 98   Resp:  18   Temp: 97.8 F (36.6 C)   SpO2: 97%    Filed Weights   03/02/20 1020  Weight: 159 lb (72.1 kg)    Physical Exam Constitutional:      Appearance: Normal appearance. She is normal weight.  Cardiovascular:     Rate and Rhythm: Normal rate and regular rhythm.     Heart sounds: Normal heart sounds.  Pulmonary:     Effort: Pulmonary effort is normal.     Breath sounds: Normal breath sounds.  Abdominal:     General: Bowel sounds are normal.     Palpations: Abdomen is soft.  Musculoskeletal:        General: Normal range of motion.  Skin:    General: Skin is warm and dry.  Neurological:     Mental Status: She is alert and oriented to person, place, and time. Mental status is at baseline.  Psychiatric:        Mood and Affect: Mood normal.        Behavior: Behavior normal.        Thought Content: Thought content normal.        Judgment: Judgment normal.   Right breast mass in the upper outer quadrant is stable.  No skin involvement.   LABORATORY DATA:  I have reviewed the labs as listed.  CBC    Component Value Date/Time   WBC 7.3 03/02/2020 1004   RBC 3.50 (L) 03/02/2020 1004   HGB 9.8 (L) 03/02/2020 1004   HGB 12.7 11/24/2017 1626   HCT 30.6 (L) 03/02/2020 1004   HCT 36.6 11/24/2017 1626   PLT 195 03/02/2020 1004   PLT 420 (H) 11/24/2017 1626   MCV 87.4 03/02/2020 1004   MCV 88 11/24/2017 1626   MCH 28.0 03/02/2020 1004   MCHC 32.0 03/02/2020 1004   RDW 19.1 (H) 03/02/2020 1004   RDW 15.4 11/24/2017 1626   LYMPHSABS 2.3 03/02/2020 1004   LYMPHSABS 2.5 11/24/2017 1626   MONOABS 1.0 03/02/2020 1004   EOSABS 0.6 (H)  03/02/2020 1004   EOSABS 0.1 11/24/2017 1626   BASOSABS 0.1 03/02/2020 1004   BASOSABS 0.0 11/24/2017 1626   CMP Latest Ref Rng & Units 03/02/2020 02/03/2020 01/06/2020  Glucose 70 - 99 mg/dL 108(H) 101(H) 108(H)  BUN 8 - 23 mg/dL 25(H) 22 16  Creatinine 0.44 - 1.00 mg/dL 1.30(H) 1.22(H) 1.25(H)  Sodium 135 - 145 mmol/L 137 136 136  Potassium 3.5 - 5.1 mmol/L 3.3(L) 3.4(L) 3.3(L)  Chloride 98 - 111 mmol/L 106 105 106  CO2 22 - 32 mmol/L 20(L) 20(L) 21(L)  Calcium 8.9 - 10.3 mg/dL 9.1 9.1 8.9  Total Protein 6.5 - 8.1 g/dL 6.9 6.7 6.9  Total Bilirubin 0.3 - 1.2 mg/dL 1.4(H) 1.2 1.1  Alkaline Phos 38 - 126 U/L 130(H) 139(H) 120  AST 15 - 41 U/L 63(H) 75(H) 57(H)  ALT 0 - 44 U/L 28 35 26       DIAGNOSTIC IMAGING:  I have reviewed scans.    ASSESSMENT & PLAN:   Malignant neoplasm of upper-outer quadrant of right breast in female, estrogen receptor negative ([place]) 1.  Metastatic right breast cancer to the skin, ER/PR negative, HER-2 positive: -Presentation with skin involvement of the right breast, axillary region and posterior chest wall skin. -12 cycles of weekly Taxol and Herceptin from 07/31/2018 through 10/30/2018 with PET scan showing progression. -15 cycles of Kadcyla from 02/15/2019 through 02/03/2020.  Kadcyla dose reduced to 2.4 mg/kg on November 03, 2019 due  to elevated total bilirubin.  Frequency changed to every 4 weeks during last cycle on January 06, 2020. -CT CAP on January 04, 2020 showed right breast mass measuring 2.6 cm and stable.  No evidence of metastatic disease. -Physical exam today did not reveal any skin involvement.  Right breast mass is stable in size.  No adenopathy. -We reviewed her labs.  White count and platelets are adequate.  LFTs reveal total bilirubin increased to 1.4 from 1.2 previously. -I have counseled her to not to take any over-the-counter herbal supplements.  I will hold her treatment today.  I will reevaluate her next week.  Once the total bilirubin comes  down to normal levels, we will resume treatment.  2.  High risk drug monitoring: -Echo on September 13, 2019 shows EF 65-70%. -He does not have any clinical signs or symptoms of PND or orthopnea.  We plan to repeat echo in 6 months or sooner if symptomatic.  3.  Hypokalemia: -Potassium today is 3.3.  She will continue potassium 20 mEq daily at home.  4.  Skin rash: -She has skin rash from scratching and dry skin in the lower back.  She is using hydrocortisone cream which is helping.      Orders placed this encounter:  No orders of the defined types were placed in this encounter.     Derek Jack, MD [facility] [phone]

## 2020-03-02 NOTE — Progress Notes (Signed)
Patient has been assessed, vital signs and labs have been reviewed by Dr. Katragadda. HOLD treatment today due to labs. 

## 2020-03-03 NOTE — Assessment & Plan Note (Signed)
1.  Metastatic right breast cancer to the skin, ER/PR negative, HER-2 positive: -Presentation with skin involvement of the right breast, axillary region and posterior chest wall skin. -12 cycles of weekly Taxol and Herceptin from 07/31/2018 through 10/30/2018 with PET scan showing progression. -15 cycles of Kadcyla from 02/15/2019 through 02/03/2020.  Kadcyla dose reduced to 2.4 mg/kg on November 03, 2019 due to elevated total bilirubin.  Frequency changed to every 4 weeks during last cycle on January 06, 2020. -CT CAP on January 04, 2020 showed right breast mass measuring 2.6 cm and stable.  No evidence of metastatic disease. -Physical exam today did not reveal any skin involvement.  Right breast mass is stable in size.  No adenopathy. -We reviewed her labs.  White count and platelets are adequate.  LFTs reveal total bilirubin increased to 1.4 from 1.2 previously. -I have counseled her to not to take any over-the-counter herbal supplements.  I will hold her treatment today.  I will reevaluate her next week.  Once the total bilirubin comes down to normal levels, we will resume treatment.  2.  High risk drug monitoring: -Echo on September 13, 2019 shows EF 65-70%. -He does not have any clinical signs or symptoms of PND or orthopnea.  We plan to repeat echo in 6 months or sooner if symptomatic.  3.  Hypokalemia: -Potassium today is 3.3.  She will continue potassium 20 mEq daily at home.  4.  Skin rash: -She has skin rash from scratching and dry skin in the lower back.  She is using hydrocortisone cream which is helping.

## 2020-03-06 DIAGNOSIS — R69 Illness, unspecified: Secondary | ICD-10-CM | POA: Diagnosis not present

## 2020-03-09 ENCOUNTER — Inpatient Hospital Stay (HOSPITAL_COMMUNITY): Payer: Medicare HMO

## 2020-03-09 ENCOUNTER — Inpatient Hospital Stay (HOSPITAL_BASED_OUTPATIENT_CLINIC_OR_DEPARTMENT_OTHER): Payer: Medicare HMO | Admitting: Hematology

## 2020-03-09 ENCOUNTER — Encounter (HOSPITAL_COMMUNITY): Payer: Self-pay | Admitting: Hematology

## 2020-03-09 ENCOUNTER — Other Ambulatory Visit: Payer: Self-pay

## 2020-03-09 VITALS — BP 140/73 | HR 104 | Temp 96.9°F | Resp 18

## 2020-03-09 VITALS — BP 156/78 | HR 115 | Temp 97.3°F | Resp 20 | Wt 156.0 lb

## 2020-03-09 DIAGNOSIS — C50411 Malignant neoplasm of upper-outer quadrant of right female breast: Secondary | ICD-10-CM | POA: Diagnosis not present

## 2020-03-09 DIAGNOSIS — Z171 Estrogen receptor negative status [ER-]: Secondary | ICD-10-CM | POA: Diagnosis not present

## 2020-03-09 DIAGNOSIS — Z5112 Encounter for antineoplastic immunotherapy: Secondary | ICD-10-CM | POA: Diagnosis not present

## 2020-03-09 LAB — COMPREHENSIVE METABOLIC PANEL
ALT: 34 U/L (ref 0–44)
AST: 76 U/L — ABNORMAL HIGH (ref 15–41)
Albumin: 3 g/dL — ABNORMAL LOW (ref 3.5–5.0)
Alkaline Phosphatase: 147 U/L — ABNORMAL HIGH (ref 38–126)
Anion gap: 9 (ref 5–15)
BUN: 25 mg/dL — ABNORMAL HIGH (ref 8–23)
CO2: 20 mmol/L — ABNORMAL LOW (ref 22–32)
Calcium: 10.2 mg/dL (ref 8.9–10.3)
Chloride: 108 mmol/L (ref 98–111)
Creatinine, Ser: 1.22 mg/dL — ABNORMAL HIGH (ref 0.44–1.00)
GFR calc Af Amer: 46 mL/min — ABNORMAL LOW (ref >60)
GFR calc non Af Amer: 40 mL/min — ABNORMAL LOW (ref >60)
Glucose, Bld: 108 mg/dL — ABNORMAL HIGH (ref 70–99)
Potassium: 3.6 mmol/L (ref 3.5–5.1)
Sodium: 137 mmol/L (ref 135–145)
Total Bilirubin: 1.1 mg/dL (ref 0.3–1.2)
Total Protein: 7.1 g/dL (ref 6.5–8.1)

## 2020-03-09 LAB — CBC WITH DIFFERENTIAL/PLATELET
Abs Immature Granulocytes: 0.02 10*3/uL (ref 0.00–0.07)
Basophils Absolute: 0.1 10*3/uL (ref 0.0–0.1)
Basophils Relative: 1 %
Eosinophils Absolute: 1.2 10*3/uL — ABNORMAL HIGH (ref 0.0–0.5)
Eosinophils Relative: 13 %
HCT: 32.2 % — ABNORMAL LOW (ref 36.0–46.0)
Hemoglobin: 10.4 g/dL — ABNORMAL LOW (ref 12.0–15.0)
Immature Granulocytes: 0 %
Lymphocytes Relative: 39 %
Lymphs Abs: 3.8 10*3/uL (ref 0.7–4.0)
MCH: 28.1 pg (ref 26.0–34.0)
MCHC: 32.3 g/dL (ref 30.0–36.0)
MCV: 87 fL (ref 80.0–100.0)
Monocytes Absolute: 1.1 10*3/uL — ABNORMAL HIGH (ref 0.1–1.0)
Monocytes Relative: 11 %
Neutro Abs: 3.5 10*3/uL (ref 1.7–7.7)
Neutrophils Relative %: 36 %
Platelets: 217 10*3/uL (ref 150–400)
RBC: 3.7 MIL/uL — ABNORMAL LOW (ref 3.87–5.11)
RDW: 19.2 % — ABNORMAL HIGH (ref 11.5–15.5)
WBC: 9.8 10*3/uL (ref 4.0–10.5)
nRBC: 0 % (ref 0.0–0.2)

## 2020-03-09 MED ORDER — HEPARIN SOD (PORK) LOCK FLUSH 100 UNIT/ML IV SOLN
500.0000 [IU] | Freq: Once | INTRAVENOUS | Status: AC | PRN
  Administered 2020-03-09: 500 [IU]

## 2020-03-09 MED ORDER — ACETAMINOPHEN 325 MG PO TABS
650.0000 mg | ORAL_TABLET | Freq: Once | ORAL | Status: AC
  Administered 2020-03-09: 650 mg via ORAL

## 2020-03-09 MED ORDER — SODIUM CHLORIDE 0.9% FLUSH
10.0000 mL | INTRAVENOUS | Status: DC | PRN
  Administered 2020-03-09: 10 mL

## 2020-03-09 MED ORDER — ACETAMINOPHEN 325 MG PO TABS
ORAL_TABLET | ORAL | Status: AC
  Filled 2020-03-09: qty 2

## 2020-03-09 MED ORDER — SODIUM CHLORIDE 0.9 % IV SOLN: Freq: Once | INTRAVENOUS | Status: AC

## 2020-03-09 MED ORDER — SODIUM CHLORIDE 0.9 % IV SOLN
2.4000 mg/kg | Freq: Once | INTRAVENOUS | Status: AC
  Administered 2020-03-09: 180 mg via INTRAVENOUS
  Filled 2020-03-09: qty 9

## 2020-03-09 MED ORDER — DIPHENHYDRAMINE HCL 25 MG PO CAPS
50.0000 mg | ORAL_CAPSULE | Freq: Once | ORAL | Status: AC
  Administered 2020-03-09: 50 mg via ORAL

## 2020-03-09 MED ORDER — DIPHENHYDRAMINE HCL 25 MG PO CAPS
ORAL_CAPSULE | ORAL | Status: AC
  Filled 2020-03-09: qty 1

## 2020-03-09 NOTE — Progress Notes (Signed)
Labs reviewed with MD today. Will proceed with treatment today per MD.   Treatment given per orders. Patient tolerated it well without problems. Vitals stable and discharged home from clinic ambulatory. Follow up as scheduled.  

## 2020-03-09 NOTE — Patient Instructions (Signed)
[facility] Discharge Instructions for Patients Receiving Chemotherapy  Today you received the following chemotherapy agents   To help prevent nausea and vomiting after your treatment, we encourage you to take your nausea medication   If you develop nausea and vomiting that is not controlled by your nausea medication, call the clinic.   BELOW ARE SYMPTOMS THAT SHOULD BE REPORTED IMMEDIATELY:  *FEVER GREATER THAN 100.5 F  *CHILLS WITH OR WITHOUT FEVER  NAUSEA AND VOMITING THAT IS NOT CONTROLLED WITH YOUR NAUSEA MEDICATION  *UNUSUAL SHORTNESS OF BREATH  *UNUSUAL BRUISING OR BLEEDING  TENDERNESS IN MOUTH AND THROAT WITH OR WITHOUT PRESENCE OF ULCERS  *URINARY PROBLEMS  *BOWEL PROBLEMS  UNUSUAL RASH Items with * indicate a potential emergency and should be followed up as soon as possible.  Feel free to call the clinic should you have any questions or concerns. The clinic phone number is [phone].  Please show the CHEMO ALERT CARD at check-in to the Emergency Department and triage nurse.   

## 2020-03-09 NOTE — Progress Notes (Signed)
[name] [address] [place] [address]   CLINIC:  Medical Oncology/Hematology  PCP:  [name], MD [place] [address] [phone]   REASON FOR VISIT: HER-2 positive right breast cancer to the skin  CURRENT THERAPY: Kadcyla every 4 weeks.  BRIEF ONCOLOGIC HISTORY:  Oncology History  Malignant neoplasm of upper-outer quadrant of right breast in female, estrogen receptor negative ([place])  07/03/2018 Initial Diagnosis   Palpable right breast mass for several months with skin discoloration, clinically skin trabecular thickening with nipple retraction measuring 7.2 x 4.3 x 4.2 cm, additional mass 4 o'clock position 2.6 cm, right axillary tail 1.2 cm right axillary lymph node 1.8 cm left breast irregular mass 1 cm, adjacent mass 0.9 cm together measuring 1.9 cm, no lymphadenopathy   07/03/2018 Pathology Results   Right breast biopsy: IDC grade 2, ER 0%, PR 0%, HER-2 positive, Ki-67 40%, lymph node biopsy negative, left breast biopsy complex sclerosing lesion    07/15/2018 Cancer Staging   Staging form: Breast, AJCC 8th Edition - Clinical: Stage IIB (cT3, cN0, cM0, G2, ER-, PR-, HER2+) - Signed by [name], MD on 07/15/2018   07/31/2018 - 10/30/2018 Chemotherapy   The patient had trastuzumab (HERCEPTIN) 336 mg in sodium chloride 0.9 % 250 mL chemo infusion, 4 mg/kg = 336 mg, Intravenous,  Once, 4 of 4 cycles Administration: 336 mg (07/31/2018), 168 mg (08/07/2018), 168 mg (08/28/2018), 168 mg (08/14/2018), 168 mg (08/21/2018), 168 mg (09/04/2018), 168 mg (09/11/2018), 168 mg (09/18/2018), 168 mg (10/02/2018), 168 mg (10/09/2018), 168 mg (10/16/2018), 168 mg (10/23/2018), 168 mg (10/30/2018) PACLitaxel (TAXOL) 78 mg in sodium chloride 0.9 % 250 mL chemo infusion (</= 63m/m2), 40 mg/m2 = 78 mg (50 % of original dose 80 mg/m2), Intravenous,  Once, 4 of 4 cycles Dose modification: 40 mg/m2 (50 % of original dose 80 mg/m2, Cycle 1, Reason: Patient  Age), 53.3333 mg/m2 (66.7 % of original dose 80 mg/m2, Cycle 1, Reason: Provider Judgment), 45 mg/m2 (original dose 80 mg/m2, Cycle 3, Reason: Provider Judgment) Administration: 78 mg (07/31/2018), 102 mg (08/07/2018), 102 mg (08/28/2018), 102 mg (08/14/2018), 102 mg (08/21/2018), 102 mg (09/04/2018), 102 mg (09/18/2018), 84 mg (10/02/2018), 84 mg (10/09/2018), 84 mg (10/16/2018), 84 mg (10/23/2018), 84 mg (10/30/2018)  for chemotherapy treatment.    11/13/2018 - 02/14/2019 Chemotherapy   The patient had trastuzumab (HERCEPTIN) 504 mg in sodium chloride 0.9 % 250 mL chemo infusion, 6 mg/kg = 504 mg (100 % of original dose 6 mg/kg), Intravenous,  Once, 4 of 5 cycles Dose modification: 6 mg/kg (original dose 6 mg/kg, Cycle 1, Reason: Other (see comments), Comment: pt receving it ) Administration: 504 mg (11/13/2018), 504 mg (12/04/2018), 504 mg (01/04/2019), 504 mg (01/25/2019)  for chemotherapy treatment.    02/15/2019 -  Chemotherapy   The patient had ado-trastuzumab emtansine (KADCYLA) 240 mg in sodium chloride 0.9 % 250 mL chemo infusion, 3 mg/kg = 240 mg (100 % of original dose 3 mg/kg), Intravenous, Once, 16 of 17 cycles Dose modification: 3 mg/kg (original dose 3 mg/kg, Cycle 1, Reason: Provider Judgment), 3 mg/kg (original dose 3 mg/kg, Cycle 2, Reason: Provider Judgment), 2.4 mg/kg (original dose 3 mg/kg, Cycle 11, Reason: Other (see comments), Comment: bili upper limit of normal), 2.4 mg/kg (original dose 3 mg/kg, Cycle 12, Reason: Other (see comments), Comment: elevated bilirubin) Administration: 240 mg (02/15/2019), 240 mg (03/08/2019), 240 mg (05/10/2019), 240 mg (03/29/2019), 240 mg (04/19/2019), 240 mg (06/01/2019), 240 mg (06/22/2019),  240 mg (07/20/2019), 240 mg (08/10/2019), 240 mg (09/01/2019), 200 mg (09/22/2019), 180 mg (11/03/2019), 180 mg (12/09/2019), 180 mg (01/06/2020), 180 mg (02/03/2020)  for chemotherapy treatment.       CANCER STAGING: Cancer Staging Malignant neoplasm of upper-outer quadrant of  right breast in female, estrogen receptor negative ([name]) Staging form: Breast, AJCC 8th Edition - Clinical: Stage IIB (cT3, cN0, cM0, G2, ER-, PR-, HER2+) - Signed by [name], MD on 07/15/2018    INTERVAL HISTORY:  [name] 86 y.o. female seen for follow-up and toxicity assessment prior to next cycle of Kadcyla.  Last week we held her Kadcyla because of her elevated bilirubin.  She stopped essiac tea.  Appetite is 100%.  Energy levels are 50%.  No pains reported.  Mild fatigue is stable.  No signs of PND or orthopnea.  REVIEW OF SYSTEMS:  Review of Systems  Constitutional: Positive for fatigue.  All other systems reviewed and are negative.    PAST MEDICAL/SURGICAL HISTORY:  Past Medical History:  Diagnosis Date  . Cancer ([place]) 06/2018   right breast cancer  . GERD (gastroesophageal reflux disease)    occasional   . Hyperlipidemia   . Hypertension    clearance with note Dr Nevada Crane on chart  . Pneumonia    2 years ago/ states occ cough with sinus drainage- no fever  . Thyroid nodule    with biopsy- states following medically   Past Surgical History:  Procedure Laterality Date  . CATARACT EXTRACTION W/PHACO  11/30/2012   Procedure: CATARACT EXTRACTION PHACO AND INTRAOCULAR LENS PLACEMENT (IOC);  Surgeon: Williams Che, MD;  Location: [facility];  Service: Ophthalmology;  Laterality: Left;  CDE:11.49  . CATARACT EXTRACTION W/PHACO Right 03/15/2013   Procedure: CATARACT EXTRACTION PHACO AND INTRAOCULAR LENS PLACEMENT (IOC);  Surgeon: Williams Che, MD;  Location: [facility];  Service: Ophthalmology;  Laterality: Right;  CDE 16.15  . COLONOSCOPY    . EXCISION OF SKIN TAG Right 06/17/2016   Procedure: SHAVE OF SKIN TAG RIGHT BUTTOCK;  Surgeon: Aviva Signs, MD;  Location: [facility];  Service: General;  Laterality: Right;  . JOINT REPLACEMENT     left knee  . MASS EXCISION Left 06/17/2016   Procedure: EXCISION SKIN MALIGNANT LESION LEFT BUTTOCK;  Surgeon: Aviva Signs, MD;  Location: [facility];  Service: General;  Laterality: Left;  . PORTACATH PLACEMENT Right 07/28/2018   Procedure: INSERTION PORT-A-CATH WITH ULTRASOUND;  Surgeon: Rolm Bookbinder, MD;  Location: [facility];  Service: General;  Laterality: Right;  . PUNCH BIOPSY OF SKIN Right 07/28/2018   Procedure: PUNCH BIOPSY OF SKIN RIGHT BREAST;  Surgeon: Rolm Bookbinder, MD;  Location: [facility];  Service: General;  Laterality: Right;  . TOTAL KNEE ARTHROPLASTY  12/16/2011   Procedure: TOTAL KNEE ARTHROPLASTY;  Surgeon: Gearlean Alf, MD;  Location: [facility];  Service: Orthopedics;  Laterality: Right;  . TUBAL LIGATION       SOCIAL HISTORY:  Social History   Socioeconomic History  . Marital status: Married    Spouse name: Not on file  . Number of children: 2  . Years of education: some business school after HS  . Highest education level: Not on file  Occupational History  . Occupation: Cabin crew  . Occupation: Network engineer   Tobacco Use  . Smoking status: Never Smoker  . Smokeless tobacco: Never Used  Substance and Sexual Activity  . Alcohol use: No  . Drug use: No  . Sexual activity: Yes  Birth control/protection: None  Other Topics Concern  . Not on file  Social History Narrative   Lives at home with her husband.   4 cups caffeine per day.   Right-handed.   Social Determinants of Health   Financial Resource Strain:   . Difficulty of Paying Living Expenses:   Food Insecurity:   . Worried About Charity fundraiser in the Last Year:   . Arboriculturist in the Last Year:   Transportation Needs:   . Film/video editor (Medical):   Marland Kitchen Lack of Transportation (Non-Medical):   Physical Activity:   . Days of Exercise per Week:   . Minutes of Exercise per Session:   Stress:   . Feeling of Stress :   Social Connections:   . Frequency of Communication with Friends and Family:   . Frequency of Social Gatherings with Friends and Family:   . Attends Religious Services:     . Active Member of Clubs or Organizations:   . Attends Archivist Meetings:   Marland Kitchen Marital Status:   Intimate Partner Violence:   . Fear of Current or Ex-Partner:   . Emotionally Abused:   Marland Kitchen Physically Abused:   . Sexually Abused:     FAMILY HISTORY:  Family History  Problem Relation Age of Onset  . Heart attack Mother   . Bronchitis Father   . Diabetes Sister   . Leukemia Brother   . Lung disease Brother   . Lung disease Sister     CURRENT MEDICATIONS:  Outpatient Encounter Medications as of 03/09/2020  Medication Sig  . acetaminophen (TYLENOL) 325 MG tablet Take 650 mg by mouth every 6 (six) hours as needed.  . Ado-Trastuzumab Emtansine (KADCYLA IV) Inject into the vein.  Marland Kitchen aspirin 325 MG tablet Take 162.5 mg by mouth daily.   . cetirizine (ZYRTEC) 10 MG tablet Take 10 mg by mouth daily.  . hydrocortisone 1 % ointment Apply 1 application topically 2 (two) times daily.  Marland Kitchen lidocaine-prilocaine (EMLA) cream Apply to port site one hour prior to appointment and cover with plastic wrap.  . Misc. Devices MISC Please provide lymphedema sleeve for right arm  . Multiple Vitamin (MULTIVITAMIN WITH MINERALS) TABS tablet Take 1 tablet by mouth daily.  Glory Rosebush ULTRA test strip USE 1 STRIP TO CHECK GLUCOSE 2 TO 3 TIMES DAILY  . [DISCONTINUED] Omega-3 Fatty Acids (FISH OIL PO) Take 1 capsule by mouth daily.   . [DISCONTINUED] PACLitaxel (TAXOL IV) Inject into the vein once a week.  . [DISCONTINUED] potassium chloride SA (KLOR-CON) 20 MEQ tablet Take 1 tablet (20 mEq total) by mouth daily.  . [DISCONTINUED] Trastuzumab (HERCEPTIN IV) Inject into the vein once a week.    No facility-administered encounter medications on file as of 03/09/2020.    ALLERGIES:  Allergies  Allergen Reactions  . Penicillins Rash    Has patient had a PCN reaction causing immediate rash, facial/tongue/throat swelling, SOB or lightheadedness with hypotension: No Has patient had a PCN reaction causing  severe rash involving mucus membranes or skin necrosis: No Has patient had a PCN reaction that required hospitalization: No Has patient had a PCN reaction occurring within the last 10 years: No If all of the above answers are "NO", then may proceed with Cephalosporin use.      PHYSICAL EXAM:  ECOG Performance status: 1  Vitals:   03/09/20 0902  BP: (!) 156/78  Pulse: (!) 115  Resp: 20  Temp: (!) 97.3 F (  36.3 C)  SpO2: 99%   Filed Weights   03/09/20 0902  Weight: 156 lb (70.8 kg)    Physical Exam Constitutional:      Appearance: Normal appearance. She is normal weight.  Cardiovascular:     Rate and Rhythm: Normal rate and regular rhythm.     Heart sounds: Normal heart sounds.  Pulmonary:     Effort: Pulmonary effort is normal.     Breath sounds: Normal breath sounds.  Abdominal:     General: Bowel sounds are normal.     Palpations: Abdomen is soft.  Musculoskeletal:        General: Normal range of motion.  Skin:    General: Skin is warm and dry.  Neurological:     Mental Status: She is alert and oriented to person, place, and time. Mental status is at baseline.  Psychiatric:        Mood and Affect: Mood normal.        Behavior: Behavior normal.        Thought Content: Thought content normal.        Judgment: Judgment normal.      LABORATORY DATA:  I have reviewed the labs as listed.  CBC    Component Value Date/Time   WBC 9.8 03/09/2020 0908   RBC 3.70 (L) 03/09/2020 0908   HGB 10.4 (L) 03/09/2020 0908   HGB 12.7 11/24/2017 1626   HCT 32.2 (L) 03/09/2020 0908   HCT 36.6 11/24/2017 1626   PLT 217 03/09/2020 0908   PLT 420 (H) 11/24/2017 1626   MCV 87.0 03/09/2020 0908   MCV 88 11/24/2017 1626   MCH 28.1 03/09/2020 0908   MCHC 32.3 03/09/2020 0908   RDW 19.2 (H) 03/09/2020 0908   RDW 15.4 11/24/2017 1626   LYMPHSABS 3.8 03/09/2020 0908   LYMPHSABS 2.5 11/24/2017 1626   MONOABS 1.1 (H) 03/09/2020 0908   EOSABS 1.2 (H) 03/09/2020 0908   EOSABS  0.1 11/24/2017 1626   BASOSABS 0.1 03/09/2020 0908   BASOSABS 0.0 11/24/2017 1626   CMP Latest Ref Rng & Units 03/09/2020 03/02/2020 02/03/2020  Glucose 70 - 99 mg/dL 108(H) 108(H) 101(H)  BUN 8 - 23 mg/dL 25(H) 25(H) 22  Creatinine 0.44 - 1.00 mg/dL 1.22(H) 1.30(H) 1.22(H)  Sodium 135 - 145 mmol/L 137 137 136  Potassium 3.5 - 5.1 mmol/L 3.6 3.3(L) 3.4(L)  Chloride 98 - 111 mmol/L 108 106 105  CO2 22 - 32 mmol/L 20(L) 20(L) 20(L)  Calcium 8.9 - 10.3 mg/dL 10.2 9.1 9.1  Total Protein 6.5 - 8.1 g/dL 7.1 6.9 6.7  Total Bilirubin 0.3 - 1.2 mg/dL 1.1 1.4(H) 1.2  Alkaline Phos 38 - 126 U/L 147(H) 130(H) 139(H)  AST 15 - 41 U/L 76(H) 63(H) 75(H)  ALT 0 - 44 U/L 34 28 35       DIAGNOSTIC IMAGING:  I have reviewed scans.    ASSESSMENT & PLAN:   Malignant neoplasm of upper-outer quadrant of right breast in female, estrogen receptor negative ([place]) 1.  Metastatic right breast cancer to the skin, ER/PR negative, HER-2 positive: -Presentation with skin involvement of the right breast, axillary region and posterior chest wall skin. -12 cycles of weekly Taxol and Herceptin from 07/31/2018 through 10/30/2018 with PET scan showing progression. -15 cycles of Kadcyla from 02/15/2019 through 02/03/2020. -CT CAP on 01/04/2020 showed right breast mass measuring 2.6 cm and stable.  No evidence of metastatic disease. -Her chemotherapy was held last week as her total bilirubin is 1.4.  She was told to stop all herbal supplements. -Today rectal bilirubin improved to 1.1.  Creatinine and electrolytes are normal.  CBC was also normal. -She may proceed with her treatment today.  She will come back in 4 weeks as we have spread out her treatments for better tolerability.  I plan to repeat her CT scan in 2 months.  2.  High risk drug monitoring: -Echo on September 13, 2019 shows EF is 65-70%. -No signs of PND or orthopnea.  I plan to repeat echocardiogram prior to next visit in 4 weeks.  3.  Hypokalemia: -She will  continue potassium 20 mEq daily.  Potassium today 3.6.  4.  Skin rash: -She is skin-from scratching and dry skin in the lower back which is fairly well controlled with hydrocortisone cream.      Orders placed this encounter:  Orders Placed This Encounter  Procedures  . ECHOCARDIOGRAM COMPLETE      Derek Jack, [place] [phone]

## 2020-03-09 NOTE — Progress Notes (Signed)
Patient has been assessed, vital signs and labs have been reviewed by Dr. Katragadda. ANC, Creatinine, LFTs, and Platelets are within treatment parameters per Dr. Katragadda. The patient is good to proceed with treatment at this time.  

## 2020-03-09 NOTE — Patient Instructions (Signed)
[place] at [facility] Discharge Instructions  [place] at [facility] Discharge Instructions  Labs drawn from portacath today   Thank you for choosing [place] at [facility] to provide your oncology and hematology care.  To afford each patient quality time with our provider, please arrive at least 15 minutes before your scheduled appointment time.   If you have a lab appointment with the [facility] please come in thru the Main Entrance and check in at the main information desk.  You need to re-schedule your appointment should you arrive 10 or more minutes late.  We strive to give you quality time with our providers, and arriving late affects you and other patients whose appointments are after yours.  Also, if you no show three or more times for appointments you may be dismissed from the clinic at the providers discretion.     Again, thank you for choosing [facility].  Our hope is that these requests will decrease the amount of time that you wait before being seen by our physicians.       _____________________________________________________________  Should you have questions after your visit to [facility], please contact our office at [phone] between the hours of 8:00 a.m. and 4:30 p.m.  Voicemails left after 4:00 p.m. will not be returned until the following business day.  For prescription refill requests, have your pharmacy contact our office and allow 72 hours.    Due to Covid, you will need to wear a mask upon entering the hospital. If you do not have a mask, a mask will be given to you at the Main Entrance upon arrival. For doctor visits, patients may have 1 support person with them. For treatment visits, patients can not have anyone with them due to social distancing guidelines and our immunocompromised population.       Thank you for choosing [place] at  [facility] to provide your oncology and hematology care.  To afford each patient quality time with our provider, please arrive at least 15 minutes before your scheduled appointment time.   If you have a lab appointment with the [facility] please come in thru the Main Entrance and check in at the main information desk.  You need to re-schedule your appointment should you arrive 10 or more minutes late.  We strive to give you quality time with our providers, and arriving late affects you and other patients whose appointments are after yours.  Also, if you no show three or more times for appointments you may be dismissed from the clinic at the providers discretion.     Again, thank you for choosing [facility].  Our hope is that these requests will decrease the amount of time that you wait before being seen by our physicians.       _____________________________________________________________  Should you have questions after your visit to [facility], please contact our office at [phone] between the hours of 8:00 a.m. and 4:30 p.m.  Voicemails left after 4:00 p.m. will not be returned until the following business day.  For prescription refill requests, have your pharmacy contact our office and allow 72 hours.    Due to Covid, you will need to wear a mask upon entering the hospital. If you do not have a mask, a mask will be given to you at the Main Entrance upon arrival.  For doctor visits, patients may have 1 support person with them. For treatment visits, patients can not have anyone with them due to social distancing guidelines and our immunocompromised population.

## 2020-03-09 NOTE — Assessment & Plan Note (Signed)
1.  Metastatic right breast cancer to the skin, ER/PR negative, HER-2 positive: -Presentation with skin involvement of the right breast, axillary region and posterior chest wall skin. -12 cycles of weekly Taxol and Herceptin from 07/31/2018 through 10/30/2018 with PET scan showing progression. -15 cycles of Kadcyla from 02/15/2019 through 02/03/2020. -CT CAP on 01/04/2020 showed right breast mass measuring 2.6 cm and stable.  No evidence of metastatic disease. -Her chemotherapy was held last week as her total bilirubin is 1.4.  She was told to stop all herbal supplements. -Today rectal bilirubin improved to 1.1.  Creatinine and electrolytes are normal.  CBC was also normal. -She may proceed with her treatment today.  She will come back in 4 weeks as we have spread out her treatments for better tolerability.  I plan to repeat her CT scan in 2 months.  2.  High risk drug monitoring: -Echo on September 13, 2019 shows EF is 65-70%. -No signs of PND or orthopnea.  I plan to repeat echocardiogram prior to next visit in 4 weeks.  3.  Hypokalemia: -She will continue potassium 20 mEq daily.  Potassium today 3.6.  4.  Skin rash: -She is skin-from scratching and dry skin in the lower back which is fairly well controlled with hydrocortisone cream.

## 2020-03-09 NOTE — Patient Instructions (Addendum)
[place] at [facility] Discharge Instructions  You were seen today by Dr. [name]. He went over your recent lab results. He will see you back in 1 month for labs, ECHO, treatment and follow up.   Thank you for choosing [place] at [facility] to provide your oncology and hematology care.  To afford each patient quality time with our provider, please arrive at least 15 minutes before your scheduled appointment time.   If you have a lab appointment with the [facility] please come in thru the  Main Entrance and check in at the main information desk  You need to re-schedule your appointment should you arrive 10 or more minutes late.  We strive to give you quality time with our providers, and arriving late affects you and other patients whose appointments are after yours.  Also, if you no show three or more times for appointments you may be dismissed from the clinic at the providers discretion.     Again, thank you for choosing [facility].  Our hope is that these requests will decrease the amount of time that you wait before being seen by our physicians.       _____________________________________________________________  Should you have questions after your visit to [facility], please contact our office at [phone] between the hours of 8:00 a.m. and 4:30 p.m.  Voicemails left after 4:00 p.m. will not be returned until the following business day.  For prescription refill requests, have your pharmacy contact our office and allow 72 hours.    Cancer Center Support Programs:   > Cancer Support Group  2nd Tuesday of the month 1pm-2pm, Journey Room

## 2020-03-13 DIAGNOSIS — R69 Illness, unspecified: Secondary | ICD-10-CM | POA: Diagnosis not present

## 2020-03-18 DIAGNOSIS — Z008 Encounter for other general examination: Secondary | ICD-10-CM | POA: Diagnosis not present

## 2020-03-18 DIAGNOSIS — Z880 Allergy status to penicillin: Secondary | ICD-10-CM | POA: Diagnosis not present

## 2020-03-18 DIAGNOSIS — K219 Gastro-esophageal reflux disease without esophagitis: Secondary | ICD-10-CM | POA: Diagnosis not present

## 2020-03-18 DIAGNOSIS — Z809 Family history of malignant neoplasm, unspecified: Secondary | ICD-10-CM | POA: Diagnosis not present

## 2020-03-18 DIAGNOSIS — J309 Allergic rhinitis, unspecified: Secondary | ICD-10-CM | POA: Diagnosis not present

## 2020-03-18 DIAGNOSIS — G63 Polyneuropathy in diseases classified elsewhere: Secondary | ICD-10-CM | POA: Diagnosis not present

## 2020-03-18 DIAGNOSIS — Z833 Family history of diabetes mellitus: Secondary | ICD-10-CM | POA: Diagnosis not present

## 2020-03-18 DIAGNOSIS — Z8249 Family history of ischemic heart disease and other diseases of the circulatory system: Secondary | ICD-10-CM | POA: Diagnosis not present

## 2020-03-18 DIAGNOSIS — M1990 Unspecified osteoarthritis, unspecified site: Secondary | ICD-10-CM | POA: Diagnosis not present

## 2020-03-18 DIAGNOSIS — R030 Elevated blood-pressure reading, without diagnosis of hypertension: Secondary | ICD-10-CM | POA: Diagnosis not present

## 2020-03-18 DIAGNOSIS — C50919 Malignant neoplasm of unspecified site of unspecified female breast: Secondary | ICD-10-CM | POA: Diagnosis not present

## 2020-04-03 ENCOUNTER — Other Ambulatory Visit: Payer: Self-pay

## 2020-04-03 ENCOUNTER — Ambulatory Visit (HOSPITAL_COMMUNITY)
Admission: RE | Admit: 2020-04-03 | Discharge: 2020-04-03 | Disposition: A | Discharge status: Home / Self Care | Payer: Medicare HMO | Source: Ambulatory Visit | Attending: Hematology | Admitting: Hematology

## 2020-04-03 DIAGNOSIS — Z01818 Encounter for other preprocedural examination: Secondary | ICD-10-CM | POA: Diagnosis not present

## 2020-04-03 DIAGNOSIS — I119 Hypertensive heart disease without heart failure: Secondary | ICD-10-CM | POA: Diagnosis not present

## 2020-04-03 DIAGNOSIS — C50411 Malignant neoplasm of upper-outer quadrant of right female breast: Secondary | ICD-10-CM | POA: Insufficient documentation

## 2020-04-03 DIAGNOSIS — Z171 Estrogen receptor negative status [ER-]: Secondary | ICD-10-CM | POA: Insufficient documentation

## 2020-04-03 DIAGNOSIS — Z0189 Encounter for other specified special examinations: Secondary | ICD-10-CM | POA: Diagnosis not present

## 2020-04-03 DIAGNOSIS — E785 Hyperlipidemia, unspecified: Secondary | ICD-10-CM | POA: Insufficient documentation

## 2020-04-03 DIAGNOSIS — I083 Combined rheumatic disorders of mitral, aortic and tricuspid valves: Secondary | ICD-10-CM | POA: Insufficient documentation

## 2020-04-03 NOTE — Progress Notes (Signed)
*  PRELIMINARY RESULTS* Echocardiogram 2D Echocardiogram has been performed.  [name] 04/03/2020, 11:47 AM

## 2020-04-06 ENCOUNTER — Inpatient Hospital Stay (HOSPITAL_COMMUNITY): Payer: Medicare HMO | Attending: Hematology

## 2020-04-06 ENCOUNTER — Inpatient Hospital Stay (HOSPITAL_BASED_OUTPATIENT_CLINIC_OR_DEPARTMENT_OTHER): Payer: Medicare HMO | Admitting: Hematology

## 2020-04-06 ENCOUNTER — Other Ambulatory Visit: Payer: Self-pay

## 2020-04-06 ENCOUNTER — Inpatient Hospital Stay (HOSPITAL_COMMUNITY): Payer: Medicare HMO

## 2020-04-06 VITALS — BP 143/68 | HR 86 | Temp 97.5°F | Resp 18

## 2020-04-06 VITALS — BP 136/72 | HR 108 | Temp 97.7°F | Resp 18 | Wt 155.7 lb

## 2020-04-06 DIAGNOSIS — C792 Secondary malignant neoplasm of skin: Secondary | ICD-10-CM | POA: Insufficient documentation

## 2020-04-06 DIAGNOSIS — C50411 Malignant neoplasm of upper-outer quadrant of right female breast: Secondary | ICD-10-CM

## 2020-04-06 DIAGNOSIS — Z171 Estrogen receptor negative status [ER-]: Secondary | ICD-10-CM

## 2020-04-06 DIAGNOSIS — R21 Rash and other nonspecific skin eruption: Secondary | ICD-10-CM | POA: Insufficient documentation

## 2020-04-06 DIAGNOSIS — Z5112 Encounter for antineoplastic immunotherapy: Secondary | ICD-10-CM | POA: Diagnosis present

## 2020-04-06 DIAGNOSIS — E876 Hypokalemia: Secondary | ICD-10-CM | POA: Insufficient documentation

## 2020-04-06 DIAGNOSIS — Z5181 Encounter for therapeutic drug level monitoring: Secondary | ICD-10-CM | POA: Diagnosis not present

## 2020-04-06 LAB — CBC WITH DIFFERENTIAL/PLATELET
Abs Immature Granulocytes: 0.02 10*3/uL (ref 0.00–0.07)
Basophils Absolute: 0.1 10*3/uL (ref 0.0–0.1)
Basophils Relative: 1 %
Eosinophils Absolute: 0.6 10*3/uL — ABNORMAL HIGH (ref 0.0–0.5)
Eosinophils Relative: 8 %
HCT: 31.1 % — ABNORMAL LOW (ref 36.0–46.0)
Hemoglobin: 10.1 g/dL — ABNORMAL LOW (ref 12.0–15.0)
Immature Granulocytes: 0 %
Lymphocytes Relative: 36 %
Lymphs Abs: 2.3 10*3/uL (ref 0.7–4.0)
MCH: 28.3 pg (ref 26.0–34.0)
MCHC: 32.5 g/dL (ref 30.0–36.0)
MCV: 87.1 fL (ref 80.0–100.0)
Monocytes Absolute: 0.9 10*3/uL (ref 0.1–1.0)
Monocytes Relative: 13 %
Neutro Abs: 2.8 10*3/uL (ref 1.7–7.7)
Neutrophils Relative %: 42 %
Platelets: 205 10*3/uL (ref 150–400)
RBC: 3.57 MIL/uL — ABNORMAL LOW (ref 3.87–5.11)
RDW: 18.2 % — ABNORMAL HIGH (ref 11.5–15.5)
WBC: 6.6 10*3/uL (ref 4.0–10.5)
nRBC: 0 % (ref 0.0–0.2)

## 2020-04-06 LAB — COMPREHENSIVE METABOLIC PANEL
ALT: 24 U/L (ref 0–44)
AST: 59 U/L — ABNORMAL HIGH (ref 15–41)
Albumin: 2.8 g/dL — ABNORMAL LOW (ref 3.5–5.0)
Alkaline Phosphatase: 140 U/L — ABNORMAL HIGH (ref 38–126)
Anion gap: 10 (ref 5–15)
BUN: 19 mg/dL (ref 8–23)
CO2: 22 mmol/L (ref 22–32)
Calcium: 9.1 mg/dL (ref 8.9–10.3)
Chloride: 106 mmol/L (ref 98–111)
Creatinine, Ser: 1.27 mg/dL — ABNORMAL HIGH (ref 0.44–1.00)
GFR calc Af Amer: 44 mL/min — ABNORMAL LOW (ref >60)
GFR calc non Af Amer: 38 mL/min — ABNORMAL LOW (ref >60)
Glucose, Bld: 105 mg/dL — ABNORMAL HIGH (ref 70–99)
Potassium: 3.4 mmol/L — ABNORMAL LOW (ref 3.5–5.1)
Sodium: 138 mmol/L (ref 135–145)
Total Bilirubin: 1.3 mg/dL — ABNORMAL HIGH (ref 0.3–1.2)
Total Protein: 7 g/dL (ref 6.5–8.1)

## 2020-04-06 MED ORDER — SODIUM CHLORIDE 0.9 % IV SOLN
2.4000 mg/kg | Freq: Once | INTRAVENOUS | Status: AC
  Administered 2020-04-06: 180 mg via INTRAVENOUS
  Filled 2020-04-06: qty 9

## 2020-04-06 MED ORDER — HEPARIN SOD (PORK) LOCK FLUSH 100 UNIT/ML IV SOLN
500.0000 [IU] | Freq: Once | INTRAVENOUS | Status: AC | PRN
  Administered 2020-04-06: 500 [IU]

## 2020-04-06 MED ORDER — SODIUM CHLORIDE 0.9% FLUSH
10.0000 mL | INTRAVENOUS | Status: DC | PRN
  Administered 2020-04-06: 10 mL

## 2020-04-06 MED ORDER — ACETAMINOPHEN 325 MG PO TABS
650.0000 mg | ORAL_TABLET | Freq: Once | ORAL | Status: AC
  Administered 2020-04-06: 650 mg via ORAL
  Filled 2020-04-06: qty 2

## 2020-04-06 MED ORDER — DIPHENHYDRAMINE HCL 25 MG PO CAPS
50.0000 mg | ORAL_CAPSULE | Freq: Once | ORAL | Status: AC
  Administered 2020-04-06: 50 mg via ORAL
  Filled 2020-04-06: qty 2

## 2020-04-06 MED ORDER — SODIUM CHLORIDE 0.9 % IV SOLN: Freq: Once | INTRAVENOUS | Status: AC

## 2020-04-06 NOTE — Progress Notes (Signed)
[facility] [address], [place] [address]   CLINIC:  Medical Oncology/Hematology  PCP:  Adrienne Squibb, MD [address] [name] [address] [phone]   REASON FOR VISIT:  Follow-up for HER-2 positive right breast cancer to the skin.  PRIOR THERAPY: Herceptin and Taxol from 07/31/2018 through 10/30/2018  CURRENT THERAPY: Kadcyla every 4 weeks  BRIEF ONCOLOGIC HISTORY:  Oncology History  Malignant neoplasm of upper-outer quadrant of right breast in female, estrogen receptor negative ([place])  07/03/2018 Initial Diagnosis   Palpable right breast mass for several months with skin discoloration, clinically skin trabecular thickening with nipple retraction measuring 7.2 x 4.3 x 4.2 cm, additional mass 4 o'clock position 2.6 cm, right axillary tail 1.2 cm right axillary lymph node 1.8 cm left breast irregular mass 1 cm, adjacent mass 0.9 cm together measuring 1.9 cm, no lymphadenopathy   07/03/2018 Pathology Results   Right breast biopsy: IDC grade 2, ER 0%, PR 0%, HER-2 positive, Ki-67 40%, lymph node biopsy negative, left breast biopsy complex sclerosing lesion    07/15/2018 Cancer Staging   Staging form: Breast, AJCC 8th Edition - Clinical: Stage IIB (cT3, cN0, cM0, G2, ER-, PR-, HER2+) - Signed by Adrienne Lose, MD on 07/15/2018   07/31/2018 - 10/30/2018 Chemotherapy   The patient had trastuzumab (HERCEPTIN) 336 mg in sodium chloride 0.9 % 250 mL chemo infusion, 4 mg/kg = 336 mg, Intravenous,  Once, 4 of 4 cycles Administration: 336 mg (07/31/2018), 168 mg (08/07/2018), 168 mg (08/28/2018), 168 mg (08/14/2018), 168 mg (08/21/2018), 168 mg (09/04/2018), 168 mg (09/11/2018), 168 mg (09/18/2018), 168 mg (10/02/2018), 168 mg (10/09/2018), 168 mg (10/16/2018), 168 mg (10/23/2018), 168 mg (10/30/2018) PACLitaxel (TAXOL) 78 mg in sodium chloride 0.9 % 250 mL chemo infusion (</= 67m/m2), 40 mg/m2 = 78 mg (50 % of original dose 80 mg/m2), Intravenous,  Once, 4 of 4  cycles Dose modification: 40 mg/m2 (50 % of original dose 80 mg/m2, Cycle 1, Reason: Patient Age), 53.3333 mg/m2 (66.7 % of original dose 80 mg/m2, Cycle 1, Reason: Provider Judgment), 45 mg/m2 (original dose 80 mg/m2, Cycle 3, Reason: Provider Judgment) Administration: 78 mg (07/31/2018), 102 mg (08/07/2018), 102 mg (08/28/2018), 102 mg (08/14/2018), 102 mg (08/21/2018), 102 mg (09/04/2018), 102 mg (09/18/2018), 84 mg (10/02/2018), 84 mg (10/09/2018), 84 mg (10/16/2018), 84 mg (10/23/2018), 84 mg (10/30/2018)  for chemotherapy treatment.    11/13/2018 - 02/14/2019 Chemotherapy   The patient had trastuzumab (HERCEPTIN) 504 mg in sodium chloride 0.9 % 250 mL chemo infusion, 6 mg/kg = 504 mg (100 % of original dose 6 mg/kg), Intravenous,  Once, 4 of 5 cycles Dose modification: 6 mg/kg (original dose 6 mg/kg, Cycle 1, Reason: Other (see comments), Comment: pt receving it ) Administration: 504 mg (11/13/2018), 504 mg (12/04/2018), 504 mg (01/04/2019), 504 mg (01/25/2019)  for chemotherapy treatment.    02/15/2019 -  Chemotherapy   The patient had ado-trastuzumab emtansine (KADCYLA) 240 mg in sodium chloride 0.9 % 250 mL chemo infusion, 3 mg/kg = 240 mg (100 % of original dose 3 mg/kg), Intravenous, Once, 16 of 18 cycles Dose modification: 3 mg/kg (original dose 3 mg/kg, Cycle 1, Reason: Provider Judgment), 3 mg/kg (original dose 3 mg/kg, Cycle 2, Reason: Provider Judgment), 2.4 mg/kg (original dose 3 mg/kg, Cycle 11, Reason: Other (see comments), Comment: bili upper limit of normal), 2.4 mg/kg (original dose 3 mg/kg, Cycle 12, Reason: Other (see comments), Comment: elevated bilirubin) Administration: 240 mg (02/15/2019), 240 mg (03/08/2019), 240  mg (05/10/2019), 240 mg (03/29/2019), 240 mg (04/19/2019), 240 mg (06/01/2019), 240 mg (06/22/2019), 240 mg (07/20/2019), 240 mg (08/10/2019), 240 mg (09/01/2019), 200 mg (09/22/2019), 180 mg (11/03/2019), 180 mg (12/09/2019), 180 mg (01/06/2020), 180 mg (02/03/2020), 180 mg (03/09/2020)  for  chemotherapy treatment.      CANCER STAGING: Cancer Staging Malignant neoplasm of upper-outer quadrant of right breast in female, estrogen receptor negative ([place]) Staging form: Breast, AJCC 8th Edition - Clinical: Stage IIB (cT3, cN0, cM0, G2, ER-, PR-, HER2+) - Signed by Adrienne Lose, MD on 07/15/2018   INTERVAL HISTORY:  [name], a 86 y.o. female, returns for routine follow-up of her HER-2 positive right breast cancer to the skin. [name] was last seen on 03/09/2020.  Today she reports that she did good with her previous treatment and feels good overall. Her appetite is good.  She has some itching in the back and apply steroid cream.    REVIEW OF SYSTEMS:  Review of Systems  Constitutional: Positive for fatigue (moderate). Negative for appetite change.  Cardiovascular: Negative for leg swelling.  All other systems reviewed and are negative.   PAST MEDICAL/SURGICAL HISTORY:  Past Medical History:  Diagnosis Date  . Cancer ([facility]) 06/2018   right breast cancer  . GERD (gastroesophageal reflux disease)    occasional   . Hyperlipidemia   . Hypertension    clearance with note Dr Nevada Crane on chart  . Pneumonia    2 years ago/ states occ cough with sinus drainage- no fever  . Thyroid nodule    with biopsy- states following medically   Past Surgical History:  Procedure Laterality Date  . CATARACT EXTRACTION W/PHACO  11/30/2012   Procedure: CATARACT EXTRACTION PHACO AND INTRAOCULAR LENS PLACEMENT (IOC);  Surgeon: Williams Che, MD;  Location: [facility];  Service: Ophthalmology;  Laterality: Left;  CDE:11.49  . CATARACT EXTRACTION W/PHACO Right 03/15/2013   Procedure: CATARACT EXTRACTION PHACO AND INTRAOCULAR LENS PLACEMENT (IOC);  Surgeon: Williams Che, MD;  Location: [facility];  Service: Ophthalmology;  Laterality: Right;  CDE 16.15  . COLONOSCOPY    . EXCISION OF SKIN TAG Right 06/17/2016   Procedure: SHAVE OF SKIN TAG RIGHT BUTTOCK;  Surgeon: Aviva Signs, MD;  Location: [facility];  Service: General;  Laterality: Right;  . JOINT REPLACEMENT     left knee  . MASS EXCISION Left 06/17/2016   Procedure: EXCISION SKIN MALIGNANT LESION LEFT BUTTOCK;  Surgeon: Aviva Signs, MD;  Location: [facility];  Service: General;  Laterality: Left;  . PORTACATH PLACEMENT Right 07/28/2018   Procedure: INSERTION PORT-A-CATH WITH ULTRASOUND;  Surgeon: Rolm Bookbinder, MD;  Location: [facility];  Service: General;  Laterality: Right;  . PUNCH BIOPSY OF SKIN Right 07/28/2018   Procedure: PUNCH BIOPSY OF SKIN RIGHT BREAST;  Surgeon: Rolm Bookbinder, MD;  Location: [facility];  Service: General;  Laterality: Right;  . TOTAL KNEE ARTHROPLASTY  12/16/2011   Procedure: TOTAL KNEE ARTHROPLASTY;  Surgeon: Gearlean Alf, MD;  Location: [facility];  Service: Orthopedics;  Laterality: Right;  . TUBAL LIGATION      SOCIAL HISTORY:  Social History   Socioeconomic History  . Marital status: Married    Spouse name: Not on file  . Number of children: 2  . Years of education: some business school after HS  . Highest education level: Not on file  Occupational History  . Occupation: Cabin crew  . Occupation: Network engineer   Tobacco Use  . Smoking status: Never  Smoker  . Smokeless tobacco: Never Used  Vaping Use  . Vaping Use: Never used  Substance and Sexual Activity  . Alcohol use: No  . Drug use: No  . Sexual activity: Yes    Birth control/protection: None  Other Topics Concern  . Not on file  Social History Narrative   Lives at home with her husband.   4 cups caffeine per day.   Right-handed.   Social Determinants of Health   Financial Resource Strain:   . Difficulty of Paying Living Expenses:   Food Insecurity:   . Worried About Charity fundraiser in the Last Year:   . Arboriculturist in the Last Year:   Transportation Needs:   . Film/video editor (Medical):   Marland Kitchen Lack of Transportation (Non-Medical):   Physical Activity:   . Days of Exercise per  Week:   . Minutes of Exercise per Session:   Stress:   . Feeling of Stress :   Social Connections:   . Frequency of Communication with Friends and Family:   . Frequency of Social Gatherings with Friends and Family:   . Attends Religious Services:   . Active Member of Clubs or Organizations:   . Attends Archivist Meetings:   Marland Kitchen Marital Status:   Intimate Partner Violence:   . Fear of Current or Ex-Partner:   . Emotionally Abused:   Marland Kitchen Physically Abused:   . Sexually Abused:     FAMILY HISTORY:  Family History  Problem Relation Age of Onset  . Heart attack Mother   . Bronchitis Father   . Diabetes Sister   . Leukemia Brother   . Lung disease Brother   . Lung disease Sister     CURRENT MEDICATIONS:  Current Outpatient Medications  Medication Sig Dispense Refill  . Ado-Trastuzumab Emtansine (KADCYLA IV) Inject into the vein.    Marland Kitchen aspirin 325 MG tablet Take 162.5 mg by mouth daily.     . cetirizine (ZYRTEC) 10 MG tablet Take 10 mg by mouth daily.    . hydrocortisone 1 % ointment Apply 1 application topically 2 (two) times daily. 60 g 0  . Misc. Devices MISC Please provide lymphedema sleeve for right arm 2 each 0  . Multiple Vitamin (MULTIVITAMIN WITH MINERALS) TABS tablet Take 1 tablet by mouth daily.    Glory Rosebush ULTRA test strip USE 1 STRIP TO CHECK GLUCOSE 2 TO 3 TIMES DAILY    . acetaminophen (TYLENOL) 325 MG tablet Take 650 mg by mouth every 6 (six) hours as needed. (Patient not taking: Reported on 04/06/2020)    . lidocaine-prilocaine (EMLA) cream Apply to port site one hour prior to appointment and cover with plastic wrap. (Patient not taking: Reported on 04/06/2020) 30 g 3   No current facility-administered medications for this visit.    ALLERGIES:  Allergies  Allergen Reactions  . Penicillins Rash    Has patient had a PCN reaction causing immediate rash, facial/tongue/throat swelling, SOB or lightheadedness with hypotension: No Has patient had a PCN  reaction causing severe rash involving mucus membranes or skin necrosis: No Has patient had a PCN reaction that required hospitalization: No Has patient had a PCN reaction occurring within the last 10 years: No If all of the above answers are "NO", then may proceed with Cephalosporin use.     PHYSICAL EXAM:  Performance status (ECOG): 1 - Symptomatic but completely ambulatory  Vitals:   04/06/20 0942 04/06/20 0959  BP: (!) 147/78  136/72  Pulse: (!) 114 (!) 108  Resp: 18   Temp: 97.7 F (36.5 C)   SpO2: 97%    Wt Readings from Last 3 Encounters:  04/06/20 155 lb 11.2 oz (70.6 kg)  03/09/20 156 lb (70.8 kg)  03/02/20 159 lb (72.1 kg)   Physical Exam Vitals reviewed.  Constitutional:      Appearance: Normal appearance.  Cardiovascular:     Rate and Rhythm: Normal rate and regular rhythm.     Pulses: Normal pulses.     Heart sounds: Normal heart sounds.  Pulmonary:     Effort: Pulmonary effort is normal.     Breath sounds: Normal breath sounds.  Chest:     Breasts:        Right: Mass (RUOQ mass) present.        Left: Normal.  Musculoskeletal:     Right lower leg: No edema.     Left lower leg: No edema.  Lymphadenopathy:     Upper Body:     Right upper body: No axillary adenopathy.     Left upper body: No axillary adenopathy.  Skin:      Neurological:     General: No focal deficit present.     Mental Status: She is alert and oriented to person, place, and time.  Psychiatric:        Mood and Affect: Mood normal.        Behavior: Behavior normal.      LABORATORY DATA:  I have reviewed the labs as listed.  CBC Latest Ref Rng & Units 04/06/2020 03/09/2020 03/02/2020  WBC 4.0 - 10.5 K/uL 6.6 9.8 7.3  Hemoglobin 12.0 - 15.0 g/dL 10.1(L) 10.4(L) 9.8(L)  Hematocrit 36 - 46 % 31.1(L) 32.2(L) 30.6(L)  Platelets 150 - 400 K/uL 205 217 195   CMP Latest Ref Rng & Units 04/06/2020 03/09/2020 03/02/2020  Glucose 70 - 99 mg/dL 105(H) 108(H) 108(H)  BUN 8 - 23 mg/dL 19 25(H)  25(H)  Creatinine 0.44 - 1.00 mg/dL 1.27(H) 1.22(H) 1.30(H)  Sodium 135 - 145 mmol/L 138 137 137  Potassium 3.5 - 5.1 mmol/L 3.4(L) 3.6 3.3(L)  Chloride 98 - 111 mmol/L 106 108 106  CO2 22 - 32 mmol/L 22 20(L) 20(L)  Calcium 8.9 - 10.3 mg/dL 9.1 10.2 9.1  Total Protein 6.5 - 8.1 g/dL 7.0 7.1 6.9  Total Bilirubin 0.3 - 1.2 mg/dL 1.3(H) 1.1 1.4(H)  Alkaline Phos 38 - 126 U/L 140(H) 147(H) 130(H)  AST 15 - 41 U/L 59(H) 76(H) 63(H)  ALT 0 - 44 U/L 24 34 28    DIAGNOSTIC IMAGING:  I have independently reviewed the scans and discussed with the patient. ECHOCARDIOGRAM COMPLETE  Result Date: 04/03/2020    ECHOCARDIOGRAM REPORT   Patient Name:   [name] Date of Exam: 04/03/2020 Medical Rec #:  [id number]    Height:       64.0 in Accession #:    [id number]   Weight:       156.0 lb Date of Birth:  07-21-1930    BSA:          1.760 m Patient Age:    49 years     BP:           172/84 mmHg Patient Gender: F            HR:           93 bpm. Exam Location:  [facility] Procedure: 2D Echo, Cardiac Doppler and Color  Doppler Indications:    C50.411,Z17.1 (ICD-10-CM) - Malignant neoplasm of upper-outer                 quadrant of right breast in female, estrogen receptor negative  History:        Patient has prior history of Echocardiogram examinations, most                 recent 09/13/2019. Risk Factors:Dyslipidemia and Hypertension.                 Malignant neoplasm of upper-outer quadrant of right breast in                 female, estrogen receptor negative.  Sonographer:    Alvino Chapel RCS Referring Phys: [phone] [place]  1. Left ventricular ejection fraction, by estimation, is 65 to 70%. The left ventricle has normal function. The left ventricle has no regional wall motion abnormalities. There is mild left ventricular hypertrophy. Left ventricular diastolic parameters are consistent with Grade I diastolic dysfunction (impaired relaxation).  2. Right ventricular systolic function is  normal. The right ventricular size is normal. There is normal pulmonary artery systolic pressure. The estimated right ventricular systolic pressure is 25.9 mmHg.  3. Left atrial size was mildly dilated.  4. The mitral valve is grossly normal. Mild mitral valve regurgitation.  5. The aortic valve is tricuspid, moderately calcified with decreased cusp excursion. Aortic valve regurgitation is trivial. Severe aortic valve stenosis. Aortic valve area, by VTI measures 0.66 cm. Aortic valve mean gradient measures 44.0 mmHg. Aortic  valve Vmax measures 4.13 m/s. Dimentionless index 0.26.  6. The inferior vena cava is normal in size with greater than 50% respiratory variability, suggesting right atrial pressure of 3 mmHg. FINDINGS  Left Ventricle: Left ventricular ejection fraction, by estimation, is 65 to 70%. The left ventricle has normal function. The left ventricle has no regional wall motion abnormalities. The left ventricular internal cavity size was normal in size. There is  mild left ventricular hypertrophy. Left ventricular diastolic parameters are consistent with Grade I diastolic dysfunction (impaired relaxation). Right Ventricle: The right ventricular size is normal. No increase in right ventricular wall thickness. Right ventricular systolic function is normal. There is normal pulmonary artery systolic pressure. The tricuspid regurgitant velocity is 2.46 m/s, and  with an assumed right atrial pressure of 3 mmHg, the estimated right ventricular systolic pressure is 56.3 mmHg. Left Atrium: Left atrial size was mildly dilated. Right Atrium: Right atrial size was normal in size. Pericardium: There is no evidence of pericardial effusion. Presence of pericardial fat pad. Mitral Valve: The mitral valve is grossly normal. Moderate mitral annular calcification. Mild mitral valve regurgitation. Tricuspid Valve: The tricuspid valve is grossly normal. Tricuspid valve regurgitation is mild. Aortic Valve: The aortic valve is  tricuspid. Aortic valve regurgitation is trivial. Aortic regurgitation PHT measures 304 msec. Severe aortic stenosis is present. Mild aortic valve annular calcification. There is moderate calcification of the aortic valve. Aortic valve mean gradient measures 44.0 mmHg. Aortic valve peak gradient measures 68.2 mmHg. Aortic valve area, by VTI measures 0.66 cm. Pulmonic Valve: The pulmonic valve was grossly normal. Pulmonic valve regurgitation is trivial. Aorta: The aortic root is normal in size and structure. Venous: The inferior vena cava is normal in size with greater than 50% respiratory variability, suggesting right atrial pressure of 3 mmHg. IAS/Shunts: No atrial level shunt detected by color flow Doppler.  LEFT VENTRICLE PLAX 2D LVIDd:  4.09 cm     Diastology LVIDs:         2.35 cm     LV e' lateral:   5.00 cm/s LV PW:         1.20 cm     LV E/e' lateral: 17.1 LV IVS:        1.37 cm     LV e' medial:    4.57 cm/s LVOT diam:     1.80 cm     LV E/e' medial:  18.7 LV SV:         64 LV SV Index:   36 LVOT Area:     2.54 cm  LV Volumes (MOD) LV vol d, MOD A2C: 67.6 ml LV vol d, MOD A4C: 59.5 ml LV vol s, MOD A2C: 20.8 ml LV vol s, MOD A4C: 20.0 ml LV SV MOD A2C:     46.8 ml LV SV MOD A4C:     59.5 ml LV SV MOD BP:      44.0 ml RIGHT VENTRICLE RV S prime:     15.60 cm/s TAPSE (M-mode): 2.1 cm LEFT ATRIUM             Index       RIGHT ATRIUM           Index LA diam:        3.50 cm 1.99 cm/m  RA Area:     13.70 cm LA Vol (A2C):   66.9 ml 38.00 ml/m RA Volume:   29.70 ml  16.87 ml/m LA Vol (A4C):   55.2 ml 31.36 ml/m LA Biplane Vol: 62.7 ml 35.62 ml/m  AORTIC VALVE AV Area (Vmax):    0.64 cm AV Area (Vmean):   0.63 cm AV Area (VTI):     0.66 cm AV Vmax:           413.00 cm/s AV Vmean:          316.000 cm/s AV VTI:            0.967 m AV Peak Grad:      68.2 mmHg AV Mean Grad:      44.0 mmHg LVOT Vmax:         104.00 cm/s LVOT Vmean:        78.500 cm/s LVOT VTI:          0.251 m LVOT/AV VTI ratio: 0.26 AI  PHT:            304 msec  AORTA Ao Root diam: 3.50 cm MITRAL VALVE                TRICUSPID VALVE MV Area (PHT): 2.91 cm     TR Peak grad:   24.2 mmHg MV Decel Time: 261 msec     TR Vmax:        246.00 cm/s MV E velocity: 85.30 cm/s MV A velocity: 152.00 cm/s  SHUNTS MV E/A ratio:  0.56         Systemic VTI:  0.25 m                             Systemic Diam: 1.80 cm Rozann Lesches MD Electronically signed by Rozann Lesches MD Signature Date/Time: 04/03/2020/12:12:12 PM    Final      ASSESSMENT: 1.  Metastatic right breast cancer to the skin, ER/PR negative, HER-2 positive: -Presentation with skin involvement of the right breast, axillary region and posterior chest  wall. -12 cycles of weekly Taxol and Herceptin from 07/31/2018 through 10/30/2018 with PET scan showing progression. -Kadcyla started on 02/15/2019. -CT CAP on 01/04/2020 showed right breast mass measuring 2.6 cm and stable.  No evidence of metastatic disease.    PLAN:  1.  Metastatic right breast cancer to the skin, ER/PR negative, HER-2 positive: -She tolerated her last cycle very well. -I reviewed her labs.  Creatinine is 1.27.  AST is 59 and improved from last visit.  Total bilirubin is mildly elevated at 1.3.  CBC was grossly normal. -We will proceed with her treatment today with dose reduction of Kadcyla. -I plan to see her back in 4 weeks with repeat CT CAP.  2.  High risk drug monitoring: -2D echo on 04/03/2020 shows EF 65-70%.  3.  Hypokalemia: -Potassium today 3.4.  Continue potassium 20 mEq daily.  4.  Skin rash: -She has erythema and scratch marks on the mid to lower back. -Continue hydrocortisone cream.   Orders placed this encounter:  No orders of the defined types were placed in this encounter.    Derek Jack, MD [facility] [phone]   I, Milinda Antis, am acting as a scribe for Dr. Sanda Linger.  I, Derek Jack MD, have reviewed the above documentation for accuracy  and completeness, and I agree with the above.

## 2020-04-06 NOTE — Patient Instructions (Signed)
[facility] Discharge Instructions for Patients Receiving Chemotherapy   Beginning January 23rd 2017 lab work for the [facility] will be done in the  Main lab at [facility] on 1st floor. If you have a lab appointment with the [facility] please come in thru the  Main Entrance and check in at the main information desk   Today you received the following chemotherapy agents Kadcyla. Follow-up as scheduled. Follow-up as scheduled  To help prevent nausea and vomiting after your treatment, we encourage you to take your nausea medication   If you develop nausea and vomiting, or diarrhea that is not controlled by your medication, call the clinic.  The clinic phone number is [phone]. Office hours are Monday-Friday 8:30am-5:00pm.  BELOW ARE SYMPTOMS THAT SHOULD BE REPORTED IMMEDIATELY:  *FEVER GREATER THAN 101.0 F  *CHILLS WITH OR WITHOUT FEVER  NAUSEA AND VOMITING THAT IS NOT CONTROLLED WITH YOUR NAUSEA MEDICATION  *UNUSUAL SHORTNESS OF BREATH  *UNUSUAL BRUISING OR BLEEDING  TENDERNESS IN MOUTH AND THROAT WITH OR WITHOUT PRESENCE OF ULCERS  *URINARY PROBLEMS  *BOWEL PROBLEMS  UNUSUAL RASH Items with * indicate a potential emergency and should be followed up as soon as possible. If you have an emergency after office hours please contact your primary care physician or go to the nearest emergency department.  Please call the clinic during office hours if you have any questions or concerns.   You may also contact the Patient Navigator at [phone] should you have any questions or need assistance in obtaining follow up care.      Resources For Cancer Patients and their Caregivers ? American Cancer Society: Can assist with transportation, wigs, general needs, runs Look Good Feel Better.        ([phone] ? Cancer Care: Provides financial assistance, online support groups, medication/co-pay assistance.  1-800-813-HOPE (505)435-2608) ? [facility] Assists [place] cancer patients and their families through emotional , educational and financial support.  662-776-5242 ? [place] DSS Where to apply for food stamps, Medicaid and utility assistance. 7322966570 ? [facility]: Transportation to medical appointments. (682) 112-7215 ? Social Security Administration: May apply for disability if have a Stage IV cancer. 8488383856 (657)048-4040 ? [employer], Disability and Transit Services: Assists with nutrition, care and transit needs. 5613909784

## 2020-04-06 NOTE — Progress Notes (Signed)
1050 Labs reviewed with and pt seen by Dr. [name] and pt approved for Kadcyla infusion today per MD                                   [name] tolerated Kadcyla infusion well without complaints or incident. VSS upon discharge. Pt discharged self ambulatory with assistance from her husband in satisfactory condition

## 2020-04-06 NOTE — Patient Instructions (Addendum)
[name] at [facility] Discharge Instructions  You were seen today by Dr. [name]. He went over your recent results. Please increase your protein intake, and drink Boost or Ensure to increase protein count. You will be scheduled for a CT scan. Dr. [name] will see you back in 4 weeks for labs and follow up.   Thank you for choosing [place] at [facility] to provide your oncology and hematology care.  To afford each patient quality time with our provider, please arrive at least 15 minutes before your scheduled appointment time.   If you have a lab appointment with the [facility] please come in thru the Main Entrance and check in at the main information desk  You need to re-schedule your appointment should you arrive 10 or more minutes late.  We strive to give you quality time with our providers, and arriving late affects you and other patients whose appointments are after yours.  Also, if you no show three or more times for appointments you may be dismissed from the clinic at the providers discretion.     Again, thank you for choosing [facility].  Our hope is that these requests will decrease the amount of time that you wait before being seen by our physicians.       _____________________________________________________________  Should you have questions after your visit to [facility], please contact our office at [phone] between the hours of 8:00 a.m. and 4:30 p.m.  Voicemails left after 4:00 p.m. will not be returned until the following business day.  For prescription refill requests, have your pharmacy contact our office and allow 72 hours.    Cancer Center Support Programs:   > Cancer Support Group  2nd Tuesday of the month 1pm-2pm, Journey Room

## 2020-04-06 NOTE — Patient Instructions (Signed)
[facility] at [facility] Discharge Instructions  Labs drawn from portacath today   Thank you for choosing [facility] at [facility] to provide your oncology and hematology care.  To afford each patient quality time with our provider, please arrive at least 15 minutes before your scheduled appointment time.   If you have a lab appointment with the Cancer Center please come in thru the Main Entrance and check in at the main information desk.  You need to re-schedule your appointment should you arrive 10 or more minutes late.  We strive to give you quality time with our providers, and arriving late affects you and other patients whose appointments are after yours.  Also, if you no show three or more times for appointments you may be dismissed from the clinic at the providers discretion.     Again, thank you for choosing [facility].  Our hope is that these requests will decrease the amount of time that you wait before being seen by our physicians.       _____________________________________________________________  Should you have questions after your visit to [facility], please contact our office at [phone] between the hours of 8:00 a.m. and 4:30 p.m.  Voicemails left after 4:00 p.m. will not be returned until the following business day.  For prescription refill requests, have your pharmacy contact our office and allow 72 hours.    Due to Covid, you will need to wear a mask upon entering the hospital. If you do not have a mask, a mask will be given to you at the Main Entrance upon arrival. For doctor visits, patients may have 1 support person with them. For treatment visits, patients can not have anyone with them due to social distancing guidelines and our immunocompromised population.     

## 2020-04-06 NOTE — Progress Notes (Signed)
Patient has been assessed, vital signs and labs have been reviewed by Dr. Katragadda. ANC, Creatinine, LFTs, and Platelets are within treatment parameters per Dr. Katragadda. The patient is good to proceed with treatment at this time.  

## 2020-04-26 ENCOUNTER — Other Ambulatory Visit: Payer: Self-pay

## 2020-04-26 ENCOUNTER — Ambulatory Visit (HOSPITAL_COMMUNITY)
Admission: RE | Admit: 2020-04-26 | Discharge: 2020-04-26 | Disposition: A | Discharge status: Home / Self Care | Payer: Medicare HMO | Source: Ambulatory Visit | Attending: Hematology | Admitting: Hematology

## 2020-04-26 DIAGNOSIS — Z171 Estrogen receptor negative status [ER-]: Secondary | ICD-10-CM | POA: Insufficient documentation

## 2020-04-26 DIAGNOSIS — K449 Diaphragmatic hernia without obstruction or gangrene: Secondary | ICD-10-CM | POA: Diagnosis not present

## 2020-04-26 DIAGNOSIS — C50919 Malignant neoplasm of unspecified site of unspecified female breast: Secondary | ICD-10-CM | POA: Diagnosis not present

## 2020-04-26 DIAGNOSIS — C50411 Malignant neoplasm of upper-outer quadrant of right female breast: Secondary | ICD-10-CM | POA: Insufficient documentation

## 2020-04-26 DIAGNOSIS — Z5111 Encounter for antineoplastic chemotherapy: Secondary | ICD-10-CM | POA: Diagnosis not present

## 2020-04-26 DIAGNOSIS — I700 Atherosclerosis of aorta: Secondary | ICD-10-CM | POA: Diagnosis not present

## 2020-04-26 DIAGNOSIS — N6310 Unspecified lump in the right breast, unspecified quadrant: Secondary | ICD-10-CM | POA: Diagnosis not present

## 2020-04-26 DIAGNOSIS — M2578 Osteophyte, vertebrae: Secondary | ICD-10-CM | POA: Diagnosis not present

## 2020-04-26 MED ORDER — IOHEXOL 300 MG/ML  SOLN
75.0000 mL | Freq: Once | INTRAMUSCULAR | Status: AC | PRN
  Administered 2020-04-26: 75 mL via INTRAVENOUS

## 2020-05-04 ENCOUNTER — Other Ambulatory Visit: Payer: Self-pay

## 2020-05-04 ENCOUNTER — Inpatient Hospital Stay (HOSPITAL_COMMUNITY): Payer: Medicare HMO

## 2020-05-04 ENCOUNTER — Inpatient Hospital Stay (HOSPITAL_COMMUNITY): Payer: Medicare HMO | Attending: Hematology

## 2020-05-04 ENCOUNTER — Inpatient Hospital Stay (HOSPITAL_BASED_OUTPATIENT_CLINIC_OR_DEPARTMENT_OTHER): Payer: Medicare HMO | Admitting: Hematology

## 2020-05-04 ENCOUNTER — Encounter (HOSPITAL_COMMUNITY): Payer: Self-pay | Admitting: *Deleted

## 2020-05-04 VITALS — BP 150/74 | HR 116 | Temp 97.7°F | Resp 18 | Wt 155.9 lb

## 2020-05-04 DIAGNOSIS — Z5112 Encounter for antineoplastic immunotherapy: Secondary | ICD-10-CM | POA: Insufficient documentation

## 2020-05-04 DIAGNOSIS — Z806 Family history of leukemia: Secondary | ICD-10-CM | POA: Insufficient documentation

## 2020-05-04 DIAGNOSIS — R21 Rash and other nonspecific skin eruption: Secondary | ICD-10-CM | POA: Diagnosis not present

## 2020-05-04 DIAGNOSIS — Z79899 Other long term (current) drug therapy: Secondary | ICD-10-CM | POA: Insufficient documentation

## 2020-05-04 DIAGNOSIS — C50411 Malignant neoplasm of upper-outer quadrant of right female breast: Secondary | ICD-10-CM

## 2020-05-04 DIAGNOSIS — R7401 Elevation of levels of liver transaminase levels: Secondary | ICD-10-CM | POA: Insufficient documentation

## 2020-05-04 DIAGNOSIS — N189 Chronic kidney disease, unspecified: Secondary | ICD-10-CM | POA: Diagnosis not present

## 2020-05-04 DIAGNOSIS — Z7982 Long term (current) use of aspirin: Secondary | ICD-10-CM | POA: Insufficient documentation

## 2020-05-04 DIAGNOSIS — K449 Diaphragmatic hernia without obstruction or gangrene: Secondary | ICD-10-CM | POA: Diagnosis not present

## 2020-05-04 DIAGNOSIS — I10 Essential (primary) hypertension: Secondary | ICD-10-CM | POA: Insufficient documentation

## 2020-05-04 DIAGNOSIS — E041 Nontoxic single thyroid nodule: Secondary | ICD-10-CM | POA: Diagnosis not present

## 2020-05-04 DIAGNOSIS — R5383 Other fatigue: Secondary | ICD-10-CM | POA: Diagnosis not present

## 2020-05-04 DIAGNOSIS — E785 Hyperlipidemia, unspecified: Secondary | ICD-10-CM | POA: Diagnosis not present

## 2020-05-04 DIAGNOSIS — R7989 Other specified abnormal findings of blood chemistry: Secondary | ICD-10-CM | POA: Diagnosis not present

## 2020-05-04 DIAGNOSIS — Z9221 Personal history of antineoplastic chemotherapy: Secondary | ICD-10-CM | POA: Insufficient documentation

## 2020-05-04 DIAGNOSIS — Z171 Estrogen receptor negative status [ER-]: Secondary | ICD-10-CM

## 2020-05-04 DIAGNOSIS — K5730 Diverticulosis of large intestine without perforation or abscess without bleeding: Secondary | ICD-10-CM | POA: Insufficient documentation

## 2020-05-04 DIAGNOSIS — K219 Gastro-esophageal reflux disease without esophagitis: Secondary | ICD-10-CM | POA: Insufficient documentation

## 2020-05-04 DIAGNOSIS — I129 Hypertensive chronic kidney disease with stage 1 through stage 4 chronic kidney disease, or unspecified chronic kidney disease: Secondary | ICD-10-CM | POA: Diagnosis not present

## 2020-05-04 DIAGNOSIS — K862 Cyst of pancreas: Secondary | ICD-10-CM | POA: Insufficient documentation

## 2020-05-04 DIAGNOSIS — K5900 Constipation, unspecified: Secondary | ICD-10-CM | POA: Insufficient documentation

## 2020-05-04 DIAGNOSIS — E876 Hypokalemia: Secondary | ICD-10-CM | POA: Insufficient documentation

## 2020-05-04 LAB — CBC WITH DIFFERENTIAL/PLATELET
Abs Immature Granulocytes: 0.01 10*3/uL (ref 0.00–0.07)
Basophils Absolute: 0.1 10*3/uL (ref 0.0–0.1)
Basophils Relative: 1 %
Eosinophils Absolute: 0.7 10*3/uL — ABNORMAL HIGH (ref 0.0–0.5)
Eosinophils Relative: 9 %
HCT: 29.6 % — ABNORMAL LOW (ref 36.0–46.0)
Hemoglobin: 9.5 g/dL — ABNORMAL LOW (ref 12.0–15.0)
Immature Granulocytes: 0 %
Lymphocytes Relative: 38 %
Lymphs Abs: 3 10*3/uL (ref 0.7–4.0)
MCH: 28.6 pg (ref 26.0–34.0)
MCHC: 32.1 g/dL (ref 30.0–36.0)
MCV: 89.2 fL (ref 80.0–100.0)
Monocytes Absolute: 1 10*3/uL (ref 0.1–1.0)
Monocytes Relative: 12 %
Neutro Abs: 3.1 10*3/uL (ref 1.7–7.7)
Neutrophils Relative %: 40 %
Platelets: 215 10*3/uL (ref 150–400)
RBC: 3.32 MIL/uL — ABNORMAL LOW (ref 3.87–5.11)
RDW: 19.9 % — ABNORMAL HIGH (ref 11.5–15.5)
WBC: 7.8 10*3/uL (ref 4.0–10.5)
nRBC: 0 % (ref 0.0–0.2)

## 2020-05-04 LAB — COMPREHENSIVE METABOLIC PANEL
ALT: 24 U/L (ref 0–44)
AST: 54 U/L — ABNORMAL HIGH (ref 15–41)
Albumin: 2.8 g/dL — ABNORMAL LOW (ref 3.5–5.0)
Alkaline Phosphatase: 125 U/L (ref 38–126)
Anion gap: 11 (ref 5–15)
BUN: 22 mg/dL (ref 8–23)
CO2: 21 mmol/L — ABNORMAL LOW (ref 22–32)
Calcium: 8.8 mg/dL — ABNORMAL LOW (ref 8.9–10.3)
Chloride: 105 mmol/L (ref 98–111)
Creatinine, Ser: 1.25 mg/dL — ABNORMAL HIGH (ref 0.44–1.00)
GFR calc Af Amer: 44 mL/min — ABNORMAL LOW (ref >60)
GFR calc non Af Amer: 38 mL/min — ABNORMAL LOW (ref >60)
Glucose, Bld: 112 mg/dL — ABNORMAL HIGH (ref 70–99)
Potassium: 3.3 mmol/L — ABNORMAL LOW (ref 3.5–5.1)
Sodium: 137 mmol/L (ref 135–145)
Total Bilirubin: 1.2 mg/dL (ref 0.3–1.2)
Total Protein: 6.7 g/dL (ref 6.5–8.1)

## 2020-05-04 MED ORDER — HEPARIN SOD (PORK) LOCK FLUSH 100 UNIT/ML IV SOLN
500.0000 [IU] | Freq: Once | INTRAVENOUS | Status: AC
  Administered 2020-05-04: 500 [IU] via INTRAVENOUS

## 2020-05-04 NOTE — Progress Notes (Signed)
No treatment today.  See note.

## 2020-05-04 NOTE — Progress Notes (Signed)
OFF PATHWAY REGIMEN - Breast  No Change  Continue With Treatment as Ordered.   OFF00020:Paclitaxel + Trastuzumab:   A cycle is every 28 days:     Paclitaxel      Trastuzumab-xxxx      Trastuzumab-xxxx   **Always confirm dose/schedule in your pharmacy ordering system**  Patient Characteristics: Preoperative or Nonsurgical Candidate (Clinical Staging), Neoadjuvant Therapy followed by Surgery, Invasive Disease, Chemotherapy, HER2 Positive, ER Negative/Unknown Therapeutic Status: Preoperative or Nonsurgical Candidate (Clinical Staging) AJCC M Category: cM0 AJCC Grade: G3 Breast Surgical Plan: Neoadjuvant Therapy followed by Surgery ER Status: Negative (-) AJCC 8 Stage Grouping: IIIB HER2 Status: Positive (+) AJCC T Category: cT4d AJCC N Category: cN0 PR Status: Negative (-) Intent of Therapy: Curative Intent, Discussed with Patient

## 2020-05-04 NOTE — Progress Notes (Signed)
Patient has been assessed, vital signs and labs have been reviewed by Dr. [name]. No treatment today due to progression on recent scans. Treatment will be changing.

## 2020-05-04 NOTE — Patient Instructions (Signed)
[place] at [facility] Discharge Instructions  You were seen today by Dr. [name]. He went over your recent results and scans. The changes in the treatment plan were discussed today, including switching to Enhertu injections. Dr. [name] will see you back in 1 week for labs and follow up.   Thank you for choosing [place] at [facility] to provide your oncology and hematology care.  To afford each patient quality time with our provider, please arrive at least 15 minutes before your scheduled appointment time.   If you have a lab appointment with the [facility] please come in thru the Main Entrance and check in at the main information desk  You need to re-schedule your appointment should you arrive 10 or more minutes late.  We strive to give you quality time with our providers, and arriving late affects you and other patients whose appointments are after yours.  Also, if you no show three or more times for appointments you may be dismissed from the clinic at the providers discretion.     Again, thank you for choosing [facility].  Our hope is that these requests will decrease the amount of time that you wait before being seen by our physicians.       _____________________________________________________________  Should you have questions after your visit to [facility], please contact our office at [phone] between the hours of 8:00 a.m. and 4:30 p.m.  Voicemails left after 4:00 p.m. will not be returned until the following business day.  For prescription refill requests, have your pharmacy contact our office and allow 72 hours.    Cancer Center Support Programs:   > Cancer Support Group  2nd Tuesday of the month 1pm-2pm, Journey Room

## 2020-05-04 NOTE — Progress Notes (Signed)
DISCONTINUE OFF PATHWAY REGIMEN - Breast   OFF00020:Paclitaxel + Trastuzumab:   A cycle is every 28 days:     Paclitaxel      Trastuzumab-xxxx      Trastuzumab-xxxx   **Always confirm dose/schedule in your pharmacy ordering system**  REASON: Disease Progression PRIOR TREATMENT: Off Pathway: Paclitaxel + Trastuzumab TREATMENT RESPONSE: Progressive Disease (PD)  START ON PATHWAY REGIMEN - Breast     A cycle is every 21 days:     Fam-trastuzumab [id number]-nxki   **Always confirm dose/schedule in your pharmacy ordering system**  Patient Characteristics: Distant Metastases or Locoregional Recurrent Disease - Unresected or Locally Advanced Unresectable Disease Progressing after Neoadjuvant and Local Therapies, HER2 Positive, ER Negative/Unknown, Chemotherapy, Third Line Therapeutic Status: Distant Metastases BRCA Mutation Status: Awaiting Test Results ER Status: Negative (-) HER2 Status: Positive (+) PR Status: Negative (-) Line of Therapy: Third Line Intent of Therapy: Non-Curative / Palliative Intent, Discussed with Patient

## 2020-05-04 NOTE — Progress Notes (Signed)
[facility] [address] [place] [address]   CLINIC:  Medical Oncology/Hematology  PCP:  Adrienne Squibb, MD [address] [name] [address] [phone]   REASON FOR VISIT:  Follow-up for HER-2 positive right breast cancer to the skin.  PRIOR THERAPY: Herceptin and Taxol x 12 cycles from 07/31/2018 through 10/30/2018  CURRENT THERAPY: Kadcyla every 4 weeks  BRIEF ONCOLOGIC HISTORY:  Oncology History  Malignant neoplasm of upper-outer quadrant of right breast in female, estrogen receptor negative ([place])  07/03/2018 Initial Diagnosis   Palpable right breast mass for several months with skin discoloration, clinically skin trabecular thickening with nipple retraction measuring 7.2 x 4.3 x 4.2 cm, additional mass 4 o'clock position 2.6 cm, right axillary tail 1.2 cm right axillary lymph node 1.8 cm left breast irregular mass 1 cm, adjacent mass 0.9 cm together measuring 1.9 cm, no lymphadenopathy   07/03/2018 Pathology Results   Right breast biopsy: IDC grade 2, ER 0%, PR 0%, HER-2 positive, Ki-67 40%, lymph node biopsy negative, left breast biopsy complex sclerosing lesion    07/15/2018 Cancer Staging   Staging form: Breast, AJCC 8th Edition - Clinical: Stage IIB (cT3, cN0, cM0, G2, ER-, PR-, HER2+) - Signed by [name], MD on 07/15/2018   07/31/2018 - 10/30/2018 Chemotherapy   The patient had trastuzumab (HERCEPTIN) 336 mg in sodium chloride 0.9 % 250 mL chemo infusion, 4 mg/kg = 336 mg, Intravenous,  Once, 4 of 4 cycles Administration: 336 mg (07/31/2018), 168 mg (08/07/2018), 168 mg (08/28/2018), 168 mg (08/14/2018), 168 mg (08/21/2018), 168 mg (09/04/2018), 168 mg (09/11/2018), 168 mg (09/18/2018), 168 mg (10/02/2018), 168 mg (10/09/2018), 168 mg (10/16/2018), 168 mg (10/23/2018), 168 mg (10/30/2018) PACLitaxel (TAXOL) 78 mg in sodium chloride 0.9 % 250 mL chemo infusion (</= 99m/m2), 40 mg/m2 = 78 mg (50 % of original dose 80 mg/m2), Intravenous,  Once, 4  of 4 cycles Dose modification: 40 mg/m2 (50 % of original dose 80 mg/m2, Cycle 1, Reason: Patient Age), 53.3333 mg/m2 (66.7 % of original dose 80 mg/m2, Cycle 1, Reason: Provider Judgment), 45 mg/m2 (original dose 80 mg/m2, Cycle 3, Reason: Provider Judgment) Administration: 78 mg (07/31/2018), 102 mg (08/07/2018), 102 mg (08/28/2018), 102 mg (08/14/2018), 102 mg (08/21/2018), 102 mg (09/04/2018), 102 mg (09/18/2018), 84 mg (10/02/2018), 84 mg (10/09/2018), 84 mg (10/16/2018), 84 mg (10/23/2018), 84 mg (10/30/2018)  for chemotherapy treatment.    11/13/2018 - 02/14/2019 Chemotherapy   The patient had trastuzumab (HERCEPTIN) 504 mg in sodium chloride 0.9 % 250 mL chemo infusion, 6 mg/kg = 504 mg (100 % of original dose 6 mg/kg), Intravenous,  Once, 4 of 5 cycles Dose modification: 6 mg/kg (original dose 6 mg/kg, Cycle 1, Reason: Other (see comments), Comment: pt receving it ) Administration: 504 mg (11/13/2018), 504 mg (12/04/2018), 504 mg (01/04/2019), 504 mg (01/25/2019)  for chemotherapy treatment.    02/15/2019 -  Chemotherapy   The patient had ado-trastuzumab emtansine (KADCYLA) 240 mg in sodium chloride 0.9 % 250 mL chemo infusion, 3 mg/kg = 240 mg (100 % of original dose 3 mg/kg), Intravenous, Once, 17 of 18 cycles Dose modification: 3 mg/kg (original dose 3 mg/kg, Cycle 1, Reason: Provider Judgment), 3 mg/kg (original dose 3 mg/kg, Cycle 2, Reason: Provider Judgment), 2.4 mg/kg (original dose 3 mg/kg, Cycle 11, Reason: Other (see comments), Comment: bili upper limit of normal), 2.4 mg/kg (original dose 3 mg/kg, Cycle 12, Reason: Other (see comments), Comment: elevated bilirubin) Administration: 240 mg (02/15/2019), 240  mg (03/08/2019), 240 mg (05/10/2019), 240 mg (03/29/2019), 240 mg (04/19/2019), 240 mg (06/01/2019), 240 mg (06/22/2019), 240 mg (07/20/2019), 240 mg (08/10/2019), 240 mg (09/01/2019), 200 mg (09/22/2019), 180 mg (11/03/2019), 180 mg (12/09/2019), 180 mg (01/06/2020), 180 mg (02/03/2020), 180 mg (03/09/2020), 180  mg (04/06/2020)  for chemotherapy treatment.      CANCER STAGING: Cancer Staging Malignant neoplasm of upper-outer quadrant of right breast in female, estrogen receptor negative ([place]) Staging form: Breast, AJCC 8th Edition - Clinical: Stage IIB (cT3, cN0, cM0, G2, ER-, PR-, HER2+) - Signed by [name], MD on 07/15/2018   INTERVAL HISTORY:  [name], a 86 y.o. female, returns for routine follow-up and consideration for next cycle of chemotherapy. [name] was last seen on 04/06/2020.  Due for cycle #18 of Kadcyla today.   Today she is accompanied by her husband. Overall, she tells me she has been feeling pretty well. She denies having any pain in her right breast. She denies having any constipation.  Overall, she feels ready for next cycle of chemo today.    REVIEW OF SYSTEMS:  Review of Systems  Constitutional: Positive for fatigue (mild). Negative for appetite change.  All other systems reviewed and are negative.   PAST MEDICAL/SURGICAL HISTORY:  Past Medical History:  Diagnosis Date  . Cancer ([place]) 06/2018   right breast cancer  . GERD (gastroesophageal reflux disease)    occasional   . Hyperlipidemia   . Hypertension    clearance with note Dr Nevada Crane on chart  . Pneumonia    2 years ago/ states occ cough with sinus drainage- no fever  . Thyroid nodule    with biopsy- states following medically   Past Surgical History:  Procedure Laterality Date  . CATARACT EXTRACTION W/PHACO  11/30/2012   Procedure: CATARACT EXTRACTION PHACO AND INTRAOCULAR LENS PLACEMENT (IOC);  Surgeon: Williams Che, MD;  Location: [facility];  Service: Ophthalmology;  Laterality: Left;  CDE:11.49  . CATARACT EXTRACTION W/PHACO Right 03/15/2013   Procedure: CATARACT EXTRACTION PHACO AND INTRAOCULAR LENS PLACEMENT (IOC);  Surgeon: Williams Che, MD;  Location: [facility];  Service: Ophthalmology;  Laterality: Right;  CDE 16.15  . COLONOSCOPY    . EXCISION OF SKIN TAG Right 06/17/2016   Procedure:  SHAVE OF SKIN TAG RIGHT BUTTOCK;  Surgeon: Aviva Signs, MD;  Location: [facility];  Service: General;  Laterality: Right;  . JOINT REPLACEMENT     left knee  . MASS EXCISION Left 06/17/2016   Procedure: EXCISION SKIN MALIGNANT LESION LEFT BUTTOCK;  Surgeon: Aviva Signs, MD;  Location: [facility];  Service: General;  Laterality: Left;  . PORTACATH PLACEMENT Right 07/28/2018   Procedure: INSERTION PORT-A-CATH WITH ULTRASOUND;  Surgeon: Rolm Bookbinder, MD;  Location: [facility];  Service: General;  Laterality: Right;  . PUNCH BIOPSY OF SKIN Right 07/28/2018   Procedure: PUNCH BIOPSY OF SKIN RIGHT BREAST;  Surgeon: Rolm Bookbinder, MD;  Location: [facility];  Service: General;  Laterality: Right;  . TOTAL KNEE ARTHROPLASTY  12/16/2011   Procedure: TOTAL KNEE ARTHROPLASTY;  Surgeon: Gearlean Alf, MD;  Location: [facility];  Service: Orthopedics;  Laterality: Right;  . TUBAL LIGATION      SOCIAL HISTORY:  Social History   Socioeconomic History  . Marital status: Married    Spouse name: Not on file  . Number of children: 2  . Years of education: some business school after HS  . Highest education level: Not on file  Occupational History  .  Occupation: Cabin crew  . Occupation: Network engineer   Tobacco Use  . Smoking status: Never Smoker  . Smokeless tobacco: Never Used  Vaping Use  . Vaping Use: Never used  Substance and Sexual Activity  . Alcohol use: No  . Drug use: No  . Sexual activity: Yes    Birth control/protection: None  Other Topics Concern  . Not on file  Social History Narrative   Lives at home with her husband.   4 cups caffeine per day.   Right-handed.   Social Determinants of Health   Financial Resource Strain:   . Difficulty of Paying Living Expenses:   Food Insecurity:   . Worried About Charity fundraiser in the Last Year:   . Arboriculturist in the Last Year:   Transportation Needs:   . Film/video editor (Medical):   Marland Kitchen Lack of  Transportation (Non-Medical):   Physical Activity:   . Days of Exercise per Week:   . Minutes of Exercise per Session:   Stress:   . Feeling of Stress :   Social Connections:   . Frequency of Communication with Friends and Family:   . Frequency of Social Gatherings with Friends and Family:   . Attends Religious Services:   . Active Member of Clubs or Organizations:   . Attends Archivist Meetings:   Marland Kitchen Marital Status:   Intimate Partner Violence:   . Fear of Current or Ex-Partner:   . Emotionally Abused:   Marland Kitchen Physically Abused:   . Sexually Abused:     FAMILY HISTORY:  Family History  Problem Relation Age of Onset  . Heart attack Mother   . Bronchitis Father   . Diabetes Sister   . Leukemia Brother   . Lung disease Brother   . Lung disease Sister     CURRENT MEDICATIONS:  Current Outpatient Medications  Medication Sig Dispense Refill  . acetaminophen (TYLENOL) 325 MG tablet Take 650 mg by mouth every 6 (six) hours as needed. (Patient not taking: Reported on 04/06/2020)    . Ado-Trastuzumab Emtansine (KADCYLA IV) Inject into the vein.    Marland Kitchen aspirin 325 MG tablet Take 162.5 mg by mouth daily.     . cetirizine (ZYRTEC) 10 MG tablet Take 10 mg by mouth daily.    . hydrocortisone 1 % ointment Apply 1 application topically 2 (two) times daily. 60 g 0  . lidocaine-prilocaine (EMLA) cream Apply to port site one hour prior to appointment and cover with plastic wrap. (Patient not taking: Reported on 04/06/2020) 30 g 3  . Misc. Devices MISC Please provide lymphedema sleeve for right arm 2 each 0  . Multiple Vitamin (MULTIVITAMIN WITH MINERALS) TABS tablet Take 1 tablet by mouth daily.    Glory Rosebush ULTRA test strip USE 1 STRIP TO CHECK GLUCOSE 2 TO 3 TIMES DAILY     No current facility-administered medications for this visit.    ALLERGIES:  Allergies  Allergen Reactions  . Penicillins Rash    Has patient had a PCN reaction causing immediate rash, facial/tongue/throat  swelling, SOB or lightheadedness with hypotension: No Has patient had a PCN reaction causing severe rash involving mucus membranes or skin necrosis: No Has patient had a PCN reaction that required hospitalization: No Has patient had a PCN reaction occurring within the last 10 years: No If all of the above answers are "NO", then may proceed with Cephalosporin use.     PHYSICAL EXAM:  Performance status (ECOG): 1 - Symptomatic  but completely ambulatory  Vitals:   05/04/20 0803  BP: (!) 150/74  Pulse: (!) 116  Resp: 18  Temp: 97.7 F (36.5 C)  SpO2: 100%   Wt Readings from Last 3 Encounters:  05/04/20 70.7 kg (155 lb 14.4 oz)  04/06/20 70.6 kg (155 lb 11.2 oz)  03/09/20 70.8 kg (156 lb)   Physical Exam Vitals reviewed.  Constitutional:      Appearance: Normal appearance.  Cardiovascular:     Rate and Rhythm: Normal rate and regular rhythm.     Pulses: Normal pulses.     Heart sounds: Normal heart sounds.  Pulmonary:     Effort: Pulmonary effort is normal.     Breath sounds: Normal breath sounds.  Chest:     Chest wall: No tenderness.     Breasts:        Right: No mass or tenderness.   Neurological:     General: No focal deficit present.     Mental Status: She is alert and oriented to person, place, and time.  Psychiatric:        Mood and Affect: Mood normal.        Behavior: Behavior normal.    Right breast mass has slightly increased in size with no skin changes. LABORATORY DATA:  I have reviewed the labs as listed.  CBC Latest Ref Rng & Units 05/04/2020 04/06/2020 03/09/2020  WBC 4.0 - 10.5 K/uL 7.8 6.6 9.8  Hemoglobin 12.0 - 15.0 g/dL 9.5(L) 10.1(L) 10.4(L)  Hematocrit 36 - 46 % 29.6(L) 31.1(L) 32.2(L)  Platelets 150 - 400 K/uL 215 205 217   CMP Latest Ref Rng & Units 05/04/2020 04/06/2020 03/09/2020  Glucose 70 - 99 mg/dL 112(H) 105(H) 108(H)  BUN 8 - 23 mg/dL 22 19 25(H)  Creatinine 0.44 - 1.00 mg/dL 1.25(H) 1.27(H) 1.22(H)  Sodium 135 - 145 mmol/L 137 138 137    Potassium 3.5 - 5.1 mmol/L 3.3(L) 3.4(L) 3.6  Chloride 98 - 111 mmol/L 105 106 108  CO2 22 - 32 mmol/L 21(L) 22 20(L)  Calcium 8.9 - 10.3 mg/dL 8.8(L) 9.1 10.2  Total Protein 6.5 - 8.1 g/dL 6.7 7.0 7.1  Total Bilirubin 0.3 - 1.2 mg/dL 1.2 1.3(H) 1.1  Alkaline Phos 38 - 126 U/L 125 140(H) 147(H)  AST 15 - 41 U/L 54(H) 59(H) 76(H)  ALT 0 - 44 U/L 24 24 34   Lab Results  Component Value Date   LDH 193 (H) 12/09/2019   LDH 194 (H) 11/24/2019   LDH 184 11/03/2019    DIAGNOSTIC IMAGING:  I have independently reviewed the scans and discussed with the patient. CT Chest W Contrast  Result Date: 04/26/2020 CLINICAL DATA:  Follow-up metastatic breast cancer. Ongoing chemotherapy. EXAM: CT CHEST, ABDOMEN, AND PELVIS WITH CONTRAST TECHNIQUE: Multidetector CT imaging of the chest, abdomen and pelvis was performed following the standard protocol during bolus administration of intravenous contrast. CONTRAST:  64m OMNIPAQUE IOHEXOL 300 MG/ML  SOLN COMPARISON:  Chest CT 01/04/2020, PET-CT 09/20/2019 FINDINGS: CT CHEST FINDINGS Cardiovascular: Coronary artery calcification and aortic atherosclerotic calcification. Mediastinum/Nodes: No axillary adenopathy. Enhancing mass within the lateral aspect of the RIGHT breast again noted measuring 3.7 by 2.8 cm compared with 2.7 x 2.3 cm for a measurable increase in size. Craniocaudad dimension lesion measures 4.3 cm compared to 3.9 cm. Along the superior aspect of the mass there is a small nodule measuring 11 mm (image 47/coronal/5) which is increased from 8 mm. There is skin thickening of the RIGHT breast adjacent  to the mass. Lungs/Pleura: No suspicious pulmonary nodules. Musculoskeletal: No aggressive osseous lesion. Osteophytosis and endplate change within the thoracic spine. CT ABDOMEN AND PELVIS FINDINGS Hepatobiliary: No focal hepatic lesion. No biliary ductal dilatation. Gallbladder is normal. Common bile duct is normal. Pancreas: No pancreatic mass or  obstruction. Small cystic lesion in the body the pancreas measuring 10 mm (image 64/2) appears benign. Similar lesion in the tail the pancreas is also unchanged. Spleen: Normal spleen Adrenals/urinary tract: Adrenal glands and kidneys are normal. The ureters and bladder normal. Stomach/Bowel: Small hiatal hernia. Probable small distal esophageal diverticulum measuring 18 mm on image 53/2. Stomach, small-bowel cecum normal. Appendix not identified. Ascending and transverse colon normal. Descending colon normal. There is thickened mucosa through the proximal sigmoid colon over a 8 cm segment. There multiple diverticula through this region. Findings similar to comparison exam. Rectum normal. Vascular/Lymphatic: Abdominal aorta is normal caliber with atherosclerotic calcification. There is no retroperitoneal or periportal lymphadenopathy. No pelvic lymphadenopathy. There is mild retroperitoneal fat stranding about the origin SMA (image 66/2). No change prior. Reproductive: Uterus and adnexa unremarkable. Other: No free fluid. Musculoskeletal: No aggressive osseous lesion. IMPRESSION: Chest Impression: 1. Interval increase in size of enhancing lobular mass in the RIGHT breast. 2. Persistent skin thickening superficial to the RIGHT breast mass. 3. No evidence of metastatic adenopathy in the thorax. 4. No suspicious pulmonary nodules. Abdomen / Pelvis Impression: 1. No evidence of metastatic disease in the abdomen pelvis. 2. No skeletal metastasis. 3. Thickening through the sigmoid colon is favored related to diverticulosis. Electronically Signed   By: Suzy Bouchard M.D.   On: 04/26/2020 13:46   CT Abdomen Pelvis W Contrast  Result Date: 04/26/2020 CLINICAL DATA:  Follow-up metastatic breast cancer. Ongoing chemotherapy. EXAM: CT CHEST, ABDOMEN, AND PELVIS WITH CONTRAST TECHNIQUE: Multidetector CT imaging of the chest, abdomen and pelvis was performed following the standard protocol during bolus administration of  intravenous contrast. CONTRAST:  67m OMNIPAQUE IOHEXOL 300 MG/ML  SOLN COMPARISON:  Chest CT 01/04/2020, PET-CT 09/20/2019 FINDINGS: CT CHEST FINDINGS Cardiovascular: Coronary artery calcification and aortic atherosclerotic calcification. Mediastinum/Nodes: No axillary adenopathy. Enhancing mass within the lateral aspect of the RIGHT breast again noted measuring 3.7 by 2.8 cm compared with 2.7 x 2.3 cm for a measurable increase in size. Craniocaudad dimension lesion measures 4.3 cm compared to 3.9 cm. Along the superior aspect of the mass there is a small nodule measuring 11 mm (image 47/coronal/5) which is increased from 8 mm. There is skin thickening of the RIGHT breast adjacent to the mass. Lungs/Pleura: No suspicious pulmonary nodules. Musculoskeletal: No aggressive osseous lesion. Osteophytosis and endplate change within the thoracic spine. CT ABDOMEN AND PELVIS FINDINGS Hepatobiliary: No focal hepatic lesion. No biliary ductal dilatation. Gallbladder is normal. Common bile duct is normal. Pancreas: No pancreatic mass or obstruction. Small cystic lesion in the body the pancreas measuring 10 mm (image 64/2) appears benign. Similar lesion in the tail the pancreas is also unchanged. Spleen: Normal spleen Adrenals/urinary tract: Adrenal glands and kidneys are normal. The ureters and bladder normal. Stomach/Bowel: Small hiatal hernia. Probable small distal esophageal diverticulum measuring 18 mm on image 53/2. Stomach, small-bowel cecum normal. Appendix not identified. Ascending and transverse colon normal. Descending colon normal. There is thickened mucosa through the proximal sigmoid colon over a 8 cm segment. There multiple diverticula through this region. Findings similar to comparison exam. Rectum normal. Vascular/Lymphatic: Abdominal aorta is normal caliber with atherosclerotic calcification. There is no retroperitoneal or periportal lymphadenopathy. No pelvic lymphadenopathy. There  is mild retroperitoneal  fat stranding about the origin SMA (image 66/2). No change prior. Reproductive: Uterus and adnexa unremarkable. Other: No free fluid. Musculoskeletal: No aggressive osseous lesion. IMPRESSION: Chest Impression: 1. Interval increase in size of enhancing lobular mass in the RIGHT breast. 2. Persistent skin thickening superficial to the RIGHT breast mass. 3. No evidence of metastatic adenopathy in the thorax. 4. No suspicious pulmonary nodules. Abdomen / Pelvis Impression: 1. No evidence of metastatic disease in the abdomen pelvis. 2. No skeletal metastasis. 3. Thickening through the sigmoid colon is favored related to diverticulosis. Electronically Signed   By: Suzy Bouchard M.D.   On: 04/26/2020 13:46     ASSESSMENT:  1. Metastatic right breast cancer to the skin, ER/PR negative, HER-2 positive: -Presentation with skin involvement of the right breast, axillary region and posterior chest wall. -12 cycles of weekly Taxol and Herceptin from 07/31/2018 through 10/30/2018 with PET scan showing progression. -Kadcyla from 02/15/2019 through 04/06/2020 with progression. -CT CAP on 04/26/2020 shows right breast mass measuring 3.7 x 2.8 (2.7 x 2.3).  CC dimension measures 4.3 cm (3.9 cm).  No new areas of metastatic disease.  No skeletal metastasis.  PLAN:  1. Metastatic right breast cancer to the skin, ER/PR negative, HER-2 positive: -We reviewed results of the CT CAP from 04/26/2020.  There is progression of the right breast mass.  Physical examination confirms this. -I have recommended discontinuation of Kadcyla at this time. -I have recommended treatment with Enhertu.  We reviewed side effects in detail including rare chance of pulmonary toxicity. -We will obtain insurance prior authorization and tentatively start her treatment next week.  2. High risk drug monitoring: -2D echo on 04/03/2020 with EF 65 to 70%.  We will continue to periodically monitor.  3. Hypokalemia: -Continue potassium 20 mEq daily.   Potassium today 3.3.  4. Skin rash: -Continue steroid cream on the lower back as she has itching.  5.  Elevated LFTs: -She has mildly elevated AST.  Today it is 40 and slightly improved.  Counseled to avoid nephrotoxins.  6.  CKD: -Mildly elevated creatinine of 1.25 and stable.   Orders placed this encounter:  No orders of the defined types were placed in this encounter.  Total time spent is 40 minutes with more than 50% of the time spent face-to-face discussing scan results, treatment plan change, side effects, counseling and coordination of care.  Derek Jack, MD [place] [phone]   I, Milinda Antis, am acting as a scribe for Dr. Sanda Linger.  I, Derek Jack MD, have reviewed the above documentation for accuracy and completeness, and I agree with the above.

## 2020-05-04 NOTE — Progress Notes (Signed)
I met with patient today after office visit with Dr. [name]. I provided written information on Enhertu. All questions were answered to her satisfaction.

## 2020-05-11 DIAGNOSIS — R69 Illness, unspecified: Secondary | ICD-10-CM | POA: Diagnosis not present

## 2020-05-15 ENCOUNTER — Inpatient Hospital Stay (HOSPITAL_COMMUNITY): Payer: Medicare HMO

## 2020-05-15 ENCOUNTER — Other Ambulatory Visit: Payer: Self-pay

## 2020-05-15 ENCOUNTER — Inpatient Hospital Stay (HOSPITAL_BASED_OUTPATIENT_CLINIC_OR_DEPARTMENT_OTHER): Payer: Medicare HMO | Admitting: Hematology

## 2020-05-15 VITALS — BP 149/75 | HR 99 | Temp 97.5°F | Resp 18 | Wt 156.7 lb

## 2020-05-15 VITALS — BP 130/60 | HR 79 | Temp 97.3°F | Resp 18

## 2020-05-15 DIAGNOSIS — C50411 Malignant neoplasm of upper-outer quadrant of right female breast: Secondary | ICD-10-CM | POA: Diagnosis not present

## 2020-05-15 DIAGNOSIS — Z5112 Encounter for antineoplastic immunotherapy: Secondary | ICD-10-CM | POA: Diagnosis not present

## 2020-05-15 DIAGNOSIS — Z171 Estrogen receptor negative status [ER-]: Secondary | ICD-10-CM | POA: Diagnosis not present

## 2020-05-15 LAB — COMPREHENSIVE METABOLIC PANEL
ALT: 28 U/L (ref 0–44)
AST: 68 U/L — ABNORMAL HIGH (ref 15–41)
Albumin: 3 g/dL — ABNORMAL LOW (ref 3.5–5.0)
Alkaline Phosphatase: 138 U/L — ABNORMAL HIGH (ref 38–126)
Anion gap: 12 (ref 5–15)
BUN: 28 mg/dL — ABNORMAL HIGH (ref 8–23)
CO2: 20 mmol/L — ABNORMAL LOW (ref 22–32)
Calcium: 9.4 mg/dL (ref 8.9–10.3)
Chloride: 107 mmol/L (ref 98–111)
Creatinine, Ser: 1.26 mg/dL — ABNORMAL HIGH (ref 0.44–1.00)
GFR calc Af Amer: 44 mL/min — ABNORMAL LOW (ref >60)
GFR calc non Af Amer: 38 mL/min — ABNORMAL LOW (ref >60)
Glucose, Bld: 100 mg/dL — ABNORMAL HIGH (ref 70–99)
Potassium: 3.6 mmol/L (ref 3.5–5.1)
Sodium: 139 mmol/L (ref 135–145)
Total Bilirubin: 1 mg/dL (ref 0.3–1.2)
Total Protein: 6.9 g/dL (ref 6.5–8.1)

## 2020-05-15 LAB — CBC WITH DIFFERENTIAL/PLATELET
Abs Immature Granulocytes: 0.01 10*3/uL (ref 0.00–0.07)
Basophils Absolute: 0.1 10*3/uL (ref 0.0–0.1)
Basophils Relative: 1 %
Eosinophils Absolute: 0.8 10*3/uL — ABNORMAL HIGH (ref 0.0–0.5)
Eosinophils Relative: 9 %
HCT: 29.8 % — ABNORMAL LOW (ref 36.0–46.0)
Hemoglobin: 9.7 g/dL — ABNORMAL LOW (ref 12.0–15.0)
Immature Granulocytes: 0 %
Lymphocytes Relative: 36 %
Lymphs Abs: 2.9 10*3/uL (ref 0.7–4.0)
MCH: 29 pg (ref 26.0–34.0)
MCHC: 32.6 g/dL (ref 30.0–36.0)
MCV: 89.2 fL (ref 80.0–100.0)
Monocytes Absolute: 0.9 10*3/uL (ref 0.1–1.0)
Monocytes Relative: 12 %
Neutro Abs: 3.3 10*3/uL (ref 1.7–7.7)
Neutrophils Relative %: 42 %
Platelets: 198 10*3/uL (ref 150–400)
RBC: 3.34 MIL/uL — ABNORMAL LOW (ref 3.87–5.11)
RDW: 19.6 % — ABNORMAL HIGH (ref 11.5–15.5)
WBC: 8 10*3/uL (ref 4.0–10.5)
nRBC: 0 % (ref 0.0–0.2)

## 2020-05-15 MED ORDER — SODIUM CHLORIDE 0.9% FLUSH
10.0000 mL | INTRAVENOUS | Status: DC | PRN
  Administered 2020-05-15: 10 mL

## 2020-05-15 MED ORDER — ACETAMINOPHEN 325 MG PO TABS
650.0000 mg | ORAL_TABLET | Freq: Once | ORAL | Status: AC
  Administered 2020-05-15: 650 mg via ORAL
  Filled 2020-05-15: qty 2

## 2020-05-15 MED ORDER — FAM-TRASTUZUMAB DERUXTECAN-NXKI CHEMO 100 MG IV SOLR
3.2000 mg/kg | Freq: Once | INTRAVENOUS | Status: AC
  Administered 2020-05-15: 226 mg via INTRAVENOUS
  Filled 2020-05-15: qty 11.3

## 2020-05-15 MED ORDER — DIPHENHYDRAMINE HCL 25 MG PO CAPS
50.0000 mg | ORAL_CAPSULE | Freq: Once | ORAL | Status: AC
  Administered 2020-05-15: 50 mg via ORAL
  Filled 2020-05-15: qty 2

## 2020-05-15 MED ORDER — PALONOSETRON HCL INJECTION 0.25 MG/5ML
0.2500 mg | Freq: Once | INTRAVENOUS | Status: AC
  Administered 2020-05-15: 0.25 mg via INTRAVENOUS
  Filled 2020-05-15: qty 5

## 2020-05-15 MED ORDER — HEPARIN SOD (PORK) LOCK FLUSH 100 UNIT/ML IV SOLN
500.0000 [IU] | Freq: Once | INTRAVENOUS | Status: AC | PRN
  Administered 2020-05-15: 500 [IU]

## 2020-05-15 MED ORDER — SODIUM CHLORIDE 0.9 % IV SOLN
10.0000 mg | Freq: Once | INTRAVENOUS | Status: AC
  Administered 2020-05-15: 10 mg via INTRAVENOUS
  Filled 2020-05-15: qty 10

## 2020-05-15 MED ORDER — DEXTROSE 5 % IV SOLN: Freq: Once | INTRAVENOUS | Status: AC

## 2020-05-15 NOTE — Patient Instructions (Addendum)
[place] at [facility] Discharge Instructions  You were seen today by Dr. [name]. He went over your recent test results. You received your treatment today. Please be vigilant for any symptoms of pneumonitis, including shortness of breath, difficulty breathing or new cough. It is advisable to receive your COVID vaccine. Your next visit will be in 1 week with the nurse practitioner for labs and follow up. Dr. [name] will see you back in 3 weeks for labs and follow up.   Thank you for choosing [place] at [facility] to provide your oncology and hematology care.  To afford each patient quality time with our provider, please arrive at least 15 minutes before your scheduled appointment time.   If you have a lab appointment with the [place] [facility] please come in thru the  Main Entrance and check in at the main information desk  You need to re-schedule your appointment should you arrive 10 or more minutes late.  We strive to give you quality time with our providers, and arriving late affects you and other patients whose appointments are after yours.  Also, if you no show three or more times for appointments you may be dismissed from the clinic at the providers discretion.     Again, thank you for choosing [facility].  Our hope is that these requests will decrease the amount of time that you wait before being seen by our physicians.       _____________________________________________________________  Should you have questions after your visit to [facility], please contact our office at [phone] between the hours of 8:00 a.m. and 4:30 p.m.  Voicemails left after 4:00 p.m. will not be returned until the following business day.  For prescription refill requests, have your pharmacy contact our office and allow 72 hours.    Cancer Center Support Programs:   > Cancer Support Group  2nd Tuesday of the month 1pm-2pm,  Journey Room

## 2020-05-15 NOTE — Progress Notes (Signed)
Patient has been assessed, vital signs and labs have been reviewed by Dr. [name]. ANC, Creatinine, LFTs, and Platelets are within treatment parameters per Dr. [name]. The patient is good to proceed with treatment at this time. Dose reduction for this treatment, plan is signed per Dr. [name].

## 2020-05-15 NOTE — Progress Notes (Deleted)
[name] [address] [place] [address]   CLINIC:  Medical Oncology/Hematology  PCP:  Adrienne Squibb, MD [address] [name] [place] [address] [phone]   REASON FOR VISIT:  Follow-up for HER-2 positive right breast cancer to the skin.  PRIOR THERAPY: Herceptin and Taxol x 12 cycles from 07/31/2018 through 10/30/2018  CURRENT THERAPY: Kadcyla every 4 weeks  BRIEF ONCOLOGIC HISTORY:  Oncology History  Malignant neoplasm of upper-outer quadrant of right breast in female, estrogen receptor negative ([place])  07/03/2018 Initial Diagnosis   Palpable right breast mass for several months with skin discoloration, clinically skin trabecular thickening with nipple retraction measuring 7.2 x 4.3 x 4.2 cm, additional mass 4 o'clock position 2.6 cm, right axillary tail 1.2 cm right axillary lymph node 1.8 cm left breast irregular mass 1 cm, adjacent mass 0.9 cm together measuring 1.9 cm, no lymphadenopathy   07/03/2018 Pathology Results   Right breast biopsy: IDC grade 2, ER 0%, PR 0%, HER-2 positive, Ki-67 40%, lymph node biopsy negative, left breast biopsy complex sclerosing lesion    07/15/2018 Cancer Staging   Staging form: Breast, AJCC 8th Edition - Clinical: Stage IIB (cT3, cN0, cM0, G2, ER-, PR-, HER2+) - Signed by Adrienne Lose, MD on 07/15/2018   07/31/2018 - 10/30/2018 Chemotherapy   The patient had trastuzumab (HERCEPTIN) 336 mg in sodium chloride 0.9 % 250 mL chemo infusion, 4 mg/kg = 336 mg, Intravenous,  Once, 4 of 4 cycles Administration: 336 mg (07/31/2018), 168 mg (08/07/2018), 168 mg (08/28/2018), 168 mg (08/14/2018), 168 mg (08/21/2018), 168 mg (09/04/2018), 168 mg (09/11/2018), 168 mg (09/18/2018), 168 mg (10/02/2018), 168 mg (10/09/2018), 168 mg (10/16/2018), 168 mg (10/23/2018), 168 mg (10/30/2018) PACLitaxel (TAXOL) 78 mg in sodium chloride 0.9 % 250 mL chemo infusion (</= 14m/m2), 40 mg/m2 = 78 mg (50 % of original dose 80 mg/m2), Intravenous,  Once, 4  of 4 cycles Dose modification: 40 mg/m2 (50 % of original dose 80 mg/m2, Cycle 1, Reason: Patient Age), 53.3333 mg/m2 (66.7 % of original dose 80 mg/m2, Cycle 1, Reason: Provider Judgment), 45 mg/m2 (original dose 80 mg/m2, Cycle 3, Reason: Provider Judgment) Administration: 78 mg (07/31/2018), 102 mg (08/07/2018), 102 mg (08/28/2018), 102 mg (08/14/2018), 102 mg (08/21/2018), 102 mg (09/04/2018), 102 mg (09/18/2018), 84 mg (10/02/2018), 84 mg (10/09/2018), 84 mg (10/16/2018), 84 mg (10/23/2018), 84 mg (10/30/2018)  for chemotherapy treatment.    11/13/2018 - 02/14/2019 Chemotherapy   The patient had trastuzumab (HERCEPTIN) 504 mg in sodium chloride 0.9 % 250 mL chemo infusion, 6 mg/kg = 504 mg (100 % of original dose 6 mg/kg), Intravenous,  Once, 4 of 5 cycles Dose modification: 6 mg/kg (original dose 6 mg/kg, Cycle 1, Reason: Other (see comments), Comment: pt receving it ) Administration: 504 mg (11/13/2018), 504 mg (12/04/2018), 504 mg (01/04/2019), 504 mg (01/25/2019)  for chemotherapy treatment.    02/15/2019 - 04/06/2020 Chemotherapy   The patient had ado-trastuzumab emtansine (KADCYLA) 240 mg in sodium chloride 0.9 % 250 mL chemo infusion, 3 mg/kg = 240 mg (100 % of original dose 3 mg/kg), Intravenous, Once, 17 of 18 cycles Dose modification: 3 mg/kg (original dose 3 mg/kg, Cycle 1, Reason: Provider Judgment), 3 mg/kg (original dose 3 mg/kg, Cycle 2, Reason: Provider Judgment), 2.4 mg/kg (original dose 3 mg/kg, Cycle 11, Reason: Other (see comments), Comment: bili upper limit of normal), 2.4 mg/kg (original dose 3 mg/kg, Cycle 12, Reason: Other (see comments), Comment: elevated bilirubin) Administration: 240 mg (02/15/2019), 240  mg (03/08/2019), 240 mg (05/10/2019), 240 mg (03/29/2019), 240 mg (04/19/2019), 240 mg (06/01/2019), 240 mg (06/22/2019), 240 mg (07/20/2019), 240 mg (08/10/2019), 240 mg (09/01/2019), 200 mg (09/22/2019), 180 mg (11/03/2019), 180 mg (12/09/2019), 180 mg (01/06/2020), 180 mg (02/03/2020), 180 mg  (03/09/2020), 180 mg (04/06/2020)  for chemotherapy treatment.    05/15/2020 -  Chemotherapy   The patient had dexamethasone (DECADRON) 4 MG tablet, 8 mg, Oral, Daily, 0 of 1 cycle, Start date: --, End date: -- PALONOSETRON HCL INJECTION 0.25 MG/5ML, 0.25 mg, Intravenous,  Once, 0 of 6 cycles fam-trastuzumab deruxtecan-nxki (ENHERTU) 382 mg in dextrose 5 % 100 mL chemo infusion, 5.4 mg/kg, Intravenous,  Once, 0 of 6 cycles  for chemotherapy treatment.      CANCER STAGING: Cancer Staging Malignant neoplasm of upper-outer quadrant of right breast in female, estrogen receptor negative ([place]) Staging form: Breast, AJCC 8th Edition - Clinical: Stage IIB (cT3, cN0, cM0, G2, ER-, PR-, HER2+) - Signed by Adrienne Lose, MD on 07/15/2018   INTERVAL HISTORY:  [name], a 86 y.o. female, returns for routine follow-up and consideration for next cycle of chemotherapy. [name] was last seen on 04/06/2020.  Due for cycle #18 of Kadcyla today.   Today she is accompanied by her husband. Overall, she tells me she has been feeling pretty well. She denies having any pain in her right breast. She denies having any constipation.  Overall, she feels ready for next cycle of chemo today.    REVIEW OF SYSTEMS:  Review of Systems  Constitutional: Positive for fatigue (mild). Negative for appetite change.  All other systems reviewed and are negative.   PAST MEDICAL/SURGICAL HISTORY:  Past Medical History:  Diagnosis Date  . Cancer ([place]) 06/2018   right breast cancer  . GERD (gastroesophageal reflux disease)    occasional   . Hyperlipidemia   . Hypertension    clearance with note Dr [name] on chart  . Pneumonia    2 years ago/ states occ cough with sinus drainage- no fever  . Thyroid nodule    with biopsy- states following medically   Past Surgical History:  Procedure Laterality Date  . CATARACT EXTRACTION W/PHACO  11/30/2012   Procedure: CATARACT EXTRACTION PHACO AND INTRAOCULAR LENS PLACEMENT  (IOC);  Surgeon: Adrienne Che, MD;  Location: [facility];  Service: Ophthalmology;  Laterality: Left;  CDE:11.49  . CATARACT EXTRACTION W/PHACO Right 03/15/2013   Procedure: CATARACT EXTRACTION PHACO AND INTRAOCULAR LENS PLACEMENT (IOC);  Surgeon: Adrienne Che, MD;  Location: [facility];  Service: Ophthalmology;  Laterality: Right;  CDE 16.15  . COLONOSCOPY    . EXCISION OF SKIN TAG Right 06/17/2016   Procedure: SHAVE OF SKIN TAG RIGHT BUTTOCK;  Surgeon: Aviva Signs, MD;  Location: [facility];  Service: General;  Laterality: Right;  . JOINT REPLACEMENT     left knee  . MASS EXCISION Left 06/17/2016   Procedure: EXCISION SKIN MALIGNANT LESION LEFT BUTTOCK;  Surgeon: Aviva Signs, MD;  Location: [facility];  Service: General;  Laterality: Left;  . PORTACATH PLACEMENT Right 07/28/2018   Procedure: INSERTION PORT-A-CATH WITH ULTRASOUND;  Surgeon: Rolm Bookbinder, MD;  Location: [facility];  Service: General;  Laterality: Right;  . PUNCH BIOPSY OF SKIN Right 07/28/2018   Procedure: PUNCH BIOPSY OF SKIN RIGHT BREAST;  Surgeon: Rolm Bookbinder, MD;  Location: [facility];  Service: General;  Laterality: Right;  . TOTAL KNEE ARTHROPLASTY  12/16/2011   Procedure: TOTAL KNEE ARTHROPLASTY;  Surgeon: Pilar Plate  Zella Ball, MD;  Location: [facility];  Service: Orthopedics;  Laterality: Right;  . TUBAL LIGATION      SOCIAL HISTORY:  Social History   Socioeconomic History  . Marital status: Married    Spouse name: Not on file  . Number of children: 2  . Years of education: some business school after HS  . Highest education level: Not on file  Occupational History  . Occupation: Cabin crew  . Occupation: Network engineer   Tobacco Use  . Smoking status: Never Smoker  . Smokeless tobacco: Never Used  Vaping Use  . Vaping Use: Never used  Substance and Sexual Activity  . Alcohol use: No  . Drug use: No  . Sexual activity: Yes    Birth control/protection: None  Other Topics Concern  . Not on  file  Social History Narrative   Lives at home with her husband.   4 cups caffeine per day.   Right-handed.   Social Determinants of Health   Financial Resource Strain:   . Difficulty of Paying Living Expenses:   Food Insecurity:   . Worried About Charity fundraiser in the Last Year:   . Arboriculturist in the Last Year:   Transportation Needs:   . Film/video editor (Medical):   Marland Kitchen Lack of Transportation (Non-Medical):   Physical Activity:   . Days of Exercise per Week:   . Minutes of Exercise per Session:   Stress:   . Feeling of Stress :   Social Connections:   . Frequency of Communication with Friends and Family:   . Frequency of Social Gatherings with Friends and Family:   . Attends Religious Services:   . Active Member of Clubs or Organizations:   . Attends Archivist Meetings:   Marland Kitchen Marital Status:   Intimate Partner Violence:   . Fear of Current or Ex-Partner:   . Emotionally Abused:   Marland Kitchen Physically Abused:   . Sexually Abused:     FAMILY HISTORY:  Family History  Problem Relation Age of Onset  . Heart attack Mother   . Bronchitis Father   . Diabetes Sister   . Leukemia Brother   . Lung disease Brother   . Lung disease Sister     CURRENT MEDICATIONS:  Current Outpatient Medications  Medication Sig Dispense Refill  . acetaminophen (TYLENOL) 325 MG tablet Take 650 mg by mouth every 6 (six) hours as needed.  (Patient not taking: Reported on 05/04/2020)    . Ado-Trastuzumab Emtansine (KADCYLA IV) Inject into the vein.    Marland Kitchen aspirin 325 MG tablet Take 162.5 mg by mouth daily.     . cetirizine (ZYRTEC) 10 MG tablet Take 10 mg by mouth daily.    . hydrocortisone 1 % ointment Apply 1 application topically 2 (two) times daily. 60 g 0  . lidocaine-prilocaine (EMLA) cream Apply to port site one hour prior to appointment and cover with plastic wrap. (Patient not taking: Reported on 05/04/2020) 30 g 3  . Misc. Devices MISC Please provide lymphedema sleeve for  right arm 2 each 0  . Multiple Vitamin (MULTIVITAMIN WITH MINERALS) TABS tablet Take 1 tablet by mouth daily.    Glory Rosebush ULTRA test strip USE 1 STRIP TO CHECK GLUCOSE 2 TO 3 TIMES DAILY     No current facility-administered medications for this visit.    ALLERGIES:  Allergies  Allergen Reactions  . Penicillins Rash    Has patient had a PCN reaction causing immediate rash,  facial/tongue/throat swelling, SOB or lightheadedness with hypotension: No Has patient had a PCN reaction causing severe rash involving mucus membranes or skin necrosis: No Has patient had a PCN reaction that required hospitalization: No Has patient had a PCN reaction occurring within the last 10 years: No If all of the above answers are "NO", then may proceed with Cephalosporin use.     PHYSICAL EXAM:  Performance status (ECOG): 1 - Symptomatic but completely ambulatory  There were no vitals filed for this visit. Wt Readings from Last 3 Encounters:  05/04/20 155 lb 14.4 oz (70.7 kg)  04/06/20 155 lb 11.2 oz (70.6 kg)  03/09/20 156 lb (70.8 kg)   Physical Exam Vitals reviewed.  Constitutional:      Appearance: Normal appearance.  Cardiovascular:     Rate and Rhythm: Normal rate and regular rhythm.     Pulses: Normal pulses.     Heart sounds: Normal heart sounds.  Pulmonary:     Effort: Pulmonary effort is normal.     Breath sounds: Normal breath sounds.  Chest:     Chest wall: No tenderness.     Breasts:        Right: No mass or tenderness.   Neurological:     General: No focal deficit present.     Mental Status: She is alert and oriented to person, place, and time.  Psychiatric:        Mood and Affect: Mood normal.        Behavior: Behavior normal.    Right breast mass has slightly increased in size with no skin changes. LABORATORY DATA:  I have reviewed the labs as listed.  CBC Latest Ref Rng & Units 05/04/2020 04/06/2020 03/09/2020  WBC 4.0 - 10.5 K/uL 7.8 6.6 9.8  Hemoglobin 12.0 - 15.0  g/dL 9.5(L) 10.1(L) 10.4(L)  Hematocrit 36 - 46 % 29.6(L) 31.1(L) 32.2(L)  Platelets 150 - 400 K/uL 215 205 217   CMP Latest Ref Rng & Units 05/04/2020 04/06/2020 03/09/2020  Glucose 70 - 99 mg/dL 112(H) 105(H) 108(H)  BUN 8 - 23 mg/dL 22 19 25(H)  Creatinine 0.44 - 1.00 mg/dL 1.25(H) 1.27(H) 1.22(H)  Sodium 135 - 145 mmol/L 137 138 137  Potassium 3.5 - 5.1 mmol/L 3.3(L) 3.4(L) 3.6  Chloride 98 - 111 mmol/L 105 106 108  CO2 22 - 32 mmol/L 21(L) 22 20(L)  Calcium 8.9 - 10.3 mg/dL 8.8(L) 9.1 10.2  Total Protein 6.5 - 8.1 g/dL 6.7 7.0 7.1  Total Bilirubin 0.3 - 1.2 mg/dL 1.2 1.3(H) 1.1  Alkaline Phos 38 - 126 U/L 125 140(H) 147(H)  AST 15 - 41 U/L 54(H) 59(H) 76(H)  ALT 0 - 44 U/L 24 24 34   Lab Results  Component Value Date   LDH 193 (H) 12/09/2019   LDH 194 (H) 11/24/2019   LDH 184 11/03/2019    DIAGNOSTIC IMAGING:  I have independently reviewed the scans and discussed with the patient. CT Chest W Contrast  Result Date: 04/26/2020 CLINICAL DATA:  Follow-up metastatic breast cancer. Ongoing chemotherapy. EXAM: CT CHEST, ABDOMEN, AND PELVIS WITH CONTRAST TECHNIQUE: Multidetector CT imaging of the chest, abdomen and pelvis was performed following the standard protocol during bolus administration of intravenous contrast. CONTRAST:  73m OMNIPAQUE IOHEXOL 300 MG/ML  SOLN COMPARISON:  Chest CT 01/04/2020, PET-CT 09/20/2019 FINDINGS: CT CHEST FINDINGS Cardiovascular: Coronary artery calcification and aortic atherosclerotic calcification. Mediastinum/Nodes: No axillary adenopathy. Enhancing mass within the lateral aspect of the RIGHT breast again noted measuring 3.7 by 2.8 cm  compared with 2.7 x 2.3 cm for a measurable increase in size. Craniocaudad dimension lesion measures 4.3 cm compared to 3.9 cm. Along the superior aspect of the mass there is a small nodule measuring 11 mm (image 47/coronal/5) which is increased from 8 mm. There is skin thickening of the RIGHT breast adjacent to the mass.  Lungs/Pleura: No suspicious pulmonary nodules. Musculoskeletal: No aggressive osseous lesion. Osteophytosis and endplate change within the thoracic spine. CT ABDOMEN AND PELVIS FINDINGS Hepatobiliary: No focal hepatic lesion. No biliary ductal dilatation. Gallbladder is normal. Common bile duct is normal. Pancreas: No pancreatic mass or obstruction. Small cystic lesion in the body the pancreas measuring 10 mm (image 64/2) appears benign. Similar lesion in the tail the pancreas is also unchanged. Spleen: Normal spleen Adrenals/urinary tract: Adrenal glands and kidneys are normal. The ureters and bladder normal. Stomach/Bowel: Small hiatal hernia. Probable small distal esophageal diverticulum measuring 18 mm on image 53/2. Stomach, small-bowel cecum normal. Appendix not identified. Ascending and transverse colon normal. Descending colon normal. There is thickened mucosa through the proximal sigmoid colon over a 8 cm segment. There multiple diverticula through this region. Findings similar to comparison exam. Rectum normal. Vascular/Lymphatic: Abdominal aorta is normal caliber with atherosclerotic calcification. There is no retroperitoneal or periportal lymphadenopathy. No pelvic lymphadenopathy. There is mild retroperitoneal fat stranding about the origin SMA (image 66/2). No change prior. Reproductive: Uterus and adnexa unremarkable. Other: No free fluid. Musculoskeletal: No aggressive osseous lesion. IMPRESSION: Chest Impression: 1. Interval increase in size of enhancing lobular mass in the RIGHT breast. 2. Persistent skin thickening superficial to the RIGHT breast mass. 3. No evidence of metastatic adenopathy in the thorax. 4. No suspicious pulmonary nodules. Abdomen / Pelvis Impression: 1. No evidence of metastatic disease in the abdomen pelvis. 2. No skeletal metastasis. 3. Thickening through the sigmoid colon is favored related to diverticulosis. Electronically Signed   By: Suzy Bouchard M.D.   On: 04/26/2020  13:46   CT Abdomen Pelvis W Contrast  Result Date: 04/26/2020 CLINICAL DATA:  Follow-up metastatic breast cancer. Ongoing chemotherapy. EXAM: CT CHEST, ABDOMEN, AND PELVIS WITH CONTRAST TECHNIQUE: Multidetector CT imaging of the chest, abdomen and pelvis was performed following the standard protocol during bolus administration of intravenous contrast. CONTRAST:  47m OMNIPAQUE IOHEXOL 300 MG/ML  SOLN COMPARISON:  Chest CT 01/04/2020, PET-CT 09/20/2019 FINDINGS: CT CHEST FINDINGS Cardiovascular: Coronary artery calcification and aortic atherosclerotic calcification. Mediastinum/Nodes: No axillary adenopathy. Enhancing mass within the lateral aspect of the RIGHT breast again noted measuring 3.7 by 2.8 cm compared with 2.7 x 2.3 cm for a measurable increase in size. Craniocaudad dimension lesion measures 4.3 cm compared to 3.9 cm. Along the superior aspect of the mass there is a small nodule measuring 11 mm (image 47/coronal/5) which is increased from 8 mm. There is skin thickening of the RIGHT breast adjacent to the mass. Lungs/Pleura: No suspicious pulmonary nodules. Musculoskeletal: No aggressive osseous lesion. Osteophytosis and endplate change within the thoracic spine. CT ABDOMEN AND PELVIS FINDINGS Hepatobiliary: No focal hepatic lesion. No biliary ductal dilatation. Gallbladder is normal. Common bile duct is normal. Pancreas: No pancreatic mass or obstruction. Small cystic lesion in the body the pancreas measuring 10 mm (image 64/2) appears benign. Similar lesion in the tail the pancreas is also unchanged. Spleen: Normal spleen Adrenals/urinary tract: Adrenal glands and kidneys are normal. The ureters and bladder normal. Stomach/Bowel: Small hiatal hernia. Probable small distal esophageal diverticulum measuring 18 mm on image 53/2. Stomach, small-bowel cecum normal. Appendix not identified. Ascending  and transverse colon normal. Descending colon normal. There is thickened mucosa through the proximal sigmoid  colon over a 8 cm segment. There multiple diverticula through this region. Findings similar to comparison exam. Rectum normal. Vascular/Lymphatic: Abdominal aorta is normal caliber with atherosclerotic calcification. There is no retroperitoneal or periportal lymphadenopathy. No pelvic lymphadenopathy. There is mild retroperitoneal fat stranding about the origin SMA (image 66/2). No change prior. Reproductive: Uterus and adnexa unremarkable. Other: No free fluid. Musculoskeletal: No aggressive osseous lesion. IMPRESSION: Chest Impression: 1. Interval increase in size of enhancing lobular mass in the RIGHT breast. 2. Persistent skin thickening superficial to the RIGHT breast mass. 3. No evidence of metastatic adenopathy in the thorax. 4. No suspicious pulmonary nodules. Abdomen / Pelvis Impression: 1. No evidence of metastatic disease in the abdomen pelvis. 2. No skeletal metastasis. 3. Thickening through the sigmoid colon is favored related to diverticulosis. Electronically Signed   By: Suzy Bouchard M.D.   On: 04/26/2020 13:46     ASSESSMENT:  1. Metastatic right breast cancer to the skin, ER/PR negative, HER-2 positive: -Presentation with skin involvement of the right breast, axillary region and posterior chest wall. -12 cycles of weekly Taxol and Herceptin from 07/31/2018 through 10/30/2018 with PET scan showing progression. -Kadcyla from 02/15/2019 through 04/06/2020 with progression. -CT CAP on 04/26/2020 shows right breast mass measuring 3.7 x 2.8 (2.7 x 2.3).  CC dimension measures 4.3 cm (3.9 cm).  No new areas of metastatic disease.  No skeletal metastasis.  PLAN:  1. Metastatic right breast cancer to the skin, ER/PR negative, HER-2 positive: -We reviewed results of the CT CAP from 04/26/2020.  There is progression of the right breast mass.  Physical examination confirms this. -I have recommended discontinuation of Kadcyla at this time. -I have recommended treatment with Enhertu.  We reviewed  side effects in detail including rare chance of pulmonary toxicity. -We will obtain insurance prior authorization and tentatively start her treatment next week.  2. High risk drug monitoring: -2D echo on 04/03/2020 with EF 65 to 70%.  We will continue to periodically monitor.  3. Hypokalemia: -Continue potassium 20 mEq daily.  Potassium today 3.3.  4. Skin rash: -Continue steroid cream on the lower back as she has itching.  5.  Elevated LFTs: -She has mildly elevated AST.  Today it is 12 and slightly improved.  Counseled to avoid nephrotoxins.  6.  CKD: -Mildly elevated creatinine of 1.25 and stable.   Orders placed this encounter:  No orders of the defined types were placed in this encounter.  Total time spent is 40 minutes with more than 50% of the time spent face-to-face discussing scan results, treatment plan change, side effects, counseling and coordination of care.  Derek Jack, MD [place] [phone]   I, Milinda Antis, am acting as a scribe for Dr. Sanda Linger.  I, Derek Jack MD, have reviewed the above documentation for accuracy and completeness, and I agree with the above.

## 2020-05-15 NOTE — Progress Notes (Signed)
[facility] [address], [place] [address]   CLINIC:  Medical Oncology/Hematology  PCP:  [name], MD [address] [name] [address] [phone]   REASON FOR VISIT:  Follow-up for HER-2 positive right breast cancer to the skin.  PRIOR THERAPY:  1. Herceptin and Taxol x 12 cycles from 07/31/2018 to 10/30/2018 2. Kadcyla from 02/15/2019 to 04/06/2020  CURRENT THERAPY: Enhertu  BRIEF ONCOLOGIC HISTORY:  Oncology History  Malignant neoplasm of upper-outer quadrant of right breast in female, estrogen receptor negative ([facility])  07/03/2018 Initial Diagnosis   Palpable right breast mass for several months with skin discoloration, clinically skin trabecular thickening with nipple retraction measuring 7.2 x 4.3 x 4.2 cm, additional mass 4 o'clock position 2.6 cm, right axillary tail 1.2 cm right axillary lymph node 1.8 cm left breast irregular mass 1 cm, adjacent mass 0.9 cm together measuring 1.9 cm, no lymphadenopathy   07/03/2018 Pathology Results   Right breast biopsy: IDC grade 2, ER 0%, PR 0%, HER-2 positive, Ki-67 40%, lymph node biopsy negative, left breast biopsy complex sclerosing lesion    07/15/2018 Cancer Staging   Staging form: Breast, AJCC 8th Edition - Clinical: Stage IIB (cT3, cN0, cM0, G2, ER-, PR-, HER2+) - Signed by [name], MD on 07/15/2018   07/31/2018 - 10/30/2018 Chemotherapy   The patient had trastuzumab (HERCEPTIN) 336 mg in sodium chloride 0.9 % 250 mL chemo infusion, 4 mg/kg = 336 mg, Intravenous,  Once, 4 of 4 cycles Administration: 336 mg (07/31/2018), 168 mg (08/07/2018), 168 mg (08/28/2018), 168 mg (08/14/2018), 168 mg (08/21/2018), 168 mg (09/04/2018), 168 mg (09/11/2018), 168 mg (09/18/2018), 168 mg (10/02/2018), 168 mg (10/09/2018), 168 mg (10/16/2018), 168 mg (10/23/2018), 168 mg (10/30/2018) PACLitaxel (TAXOL) 78 mg in sodium chloride 0.9 % 250 mL chemo infusion (</= 73m/m2), 40 mg/m2 = 78 mg (50 % of original dose 80  mg/m2), Intravenous,  Once, 4 of 4 cycles Dose modification: 40 mg/m2 (50 % of original dose 80 mg/m2, Cycle 1, Reason: Patient Age), 53.3333 mg/m2 (66.7 % of original dose 80 mg/m2, Cycle 1, Reason: Provider Judgment), 45 mg/m2 (original dose 80 mg/m2, Cycle 3, Reason: Provider Judgment) Administration: 78 mg (07/31/2018), 102 mg (08/07/2018), 102 mg (08/28/2018), 102 mg (08/14/2018), 102 mg (08/21/2018), 102 mg (09/04/2018), 102 mg (09/18/2018), 84 mg (10/02/2018), 84 mg (10/09/2018), 84 mg (10/16/2018), 84 mg (10/23/2018), 84 mg (10/30/2018)  for chemotherapy treatment.    11/13/2018 - 02/14/2019 Chemotherapy   The patient had trastuzumab (HERCEPTIN) 504 mg in sodium chloride 0.9 % 250 mL chemo infusion, 6 mg/kg = 504 mg (100 % of original dose 6 mg/kg), Intravenous,  Once, 4 of 5 cycles Dose modification: 6 mg/kg (original dose 6 mg/kg, Cycle 1, Reason: Other (see comments), Comment: pt receving it ) Administration: 504 mg (11/13/2018), 504 mg (12/04/2018), 504 mg (01/04/2019), 504 mg (01/25/2019)  for chemotherapy treatment.    02/15/2019 - 04/06/2020 Chemotherapy   The patient had ado-trastuzumab emtansine (KADCYLA) 240 mg in sodium chloride 0.9 % 250 mL chemo infusion, 3 mg/kg = 240 mg (100 % of original dose 3 mg/kg), Intravenous, Once, 17 of 18 cycles Dose modification: 3 mg/kg (original dose 3 mg/kg, Cycle 1, Reason: Provider Judgment), 3 mg/kg (original dose 3 mg/kg, Cycle 2, Reason: Provider Judgment), 2.4 mg/kg (original dose 3 mg/kg, Cycle 11, Reason: Other (see comments), Comment: bili upper limit of normal), 2.4 mg/kg (original dose 3 mg/kg, Cycle 12, Reason: Other (see comments), Comment: elevated bilirubin)  Administration: 240 mg (02/15/2019), 240 mg (03/08/2019), 240 mg (05/10/2019), 240 mg (03/29/2019), 240 mg (04/19/2019), 240 mg (06/01/2019), 240 mg (06/22/2019), 240 mg (07/20/2019), 240 mg (08/10/2019), 240 mg (09/01/2019), 200 mg (09/22/2019), 180 mg (11/03/2019), 180 mg (12/09/2019), 180 mg (01/06/2020), 180  mg (02/03/2020), 180 mg (03/09/2020), 180 mg (04/06/2020)  for chemotherapy treatment.    05/15/2020 -  Chemotherapy   The patient had dexamethasone (DECADRON) 4 MG tablet, 8 mg, Oral, Daily, 1 of 1 cycle, Start date: --, End date: -- palonosetron (ALOXI) injection 0.25 mg, 0.25 mg, Intravenous,  Once, 0 of 6 cycles fam-trastuzumab deruxtecan-nxki (ENHERTU) 382 mg in dextrose 5 % 100 mL chemo infusion, 5.4 mg/kg, Intravenous,  Once, 0 of 6 cycles  for chemotherapy treatment.      CANCER STAGING: Cancer Staging Malignant neoplasm of upper-outer quadrant of right breast in female, estrogen receptor negative ([facility]) Staging form: Breast, AJCC 8th Edition - Clinical: Stage IIB (cT3, cN0, cM0, G2, ER-, PR-, HER2+) - Signed by [name], MD on 07/15/2018   INTERVAL HISTORY:  [name], a 86 y.o. female, returns for routine follow-up and consideration for next cycle of chemotherapy. [name] was last seen on 05/04/2020.  Due for initiating cycle #1 of Enhertu and Aloxi today.   Today she is accompanied by her husband. Overall, she tells me she has been feeling pretty well. She is not currently taking potassium at home, though she eats plenty of bananas. She denies having any orthopnea.  She was inquiring today about getting her COVID vaccine.  Overall, she feels ready for next cycle of chemo today.    REVIEW OF SYSTEMS:  Review of Systems  Constitutional: Positive for fatigue (mild). Negative for appetite change.  Respiratory: Negative for shortness of breath and wheezing.   Cardiovascular: Negative for leg swelling.  All other systems reviewed and are negative.   PAST MEDICAL/SURGICAL HISTORY:  Past Medical History:  Diagnosis Date  . Cancer ([facility]) 06/2018   right breast cancer  . GERD (gastroesophageal reflux disease)    occasional   . Hyperlipidemia   . Hypertension    clearance with note Dr Nevada Crane on chart  . Pneumonia    2 years ago/ states occ cough with sinus drainage- no  fever  . Thyroid nodule    with biopsy- states following medically   Past Surgical History:  Procedure Laterality Date  . CATARACT EXTRACTION W/PHACO  11/30/2012   Procedure: CATARACT EXTRACTION PHACO AND INTRAOCULAR LENS PLACEMENT (IOC);  Surgeon: Williams Che, MD;  Location: [facility];  Service: Ophthalmology;  Laterality: Left;  CDE:11.49  . CATARACT EXTRACTION W/PHACO Right 03/15/2013   Procedure: CATARACT EXTRACTION PHACO AND INTRAOCULAR LENS PLACEMENT (IOC);  Surgeon: Williams Che, MD;  Location: [facility];  Service: Ophthalmology;  Laterality: Right;  CDE 16.15  . COLONOSCOPY    . EXCISION OF SKIN TAG Right 06/17/2016   Procedure: SHAVE OF SKIN TAG RIGHT BUTTOCK;  Surgeon: Aviva Signs, MD;  Location: [facility];  Service: General;  Laterality: Right;  . JOINT REPLACEMENT     left knee  . MASS EXCISION Left 06/17/2016   Procedure: EXCISION SKIN MALIGNANT LESION LEFT BUTTOCK;  Surgeon: Aviva Signs, MD;  Location: [facility];  Service: General;  Laterality: Left;  . PORTACATH PLACEMENT Right 07/28/2018   Procedure: INSERTION PORT-A-CATH WITH ULTRASOUND;  Surgeon: Rolm Bookbinder, MD;  Location: [facility];  Service: General;  Laterality: Right;  . PUNCH BIOPSY OF SKIN Right 07/28/2018   Procedure:  PUNCH BIOPSY OF SKIN RIGHT BREAST;  Surgeon: Emelia Loron, MD;  Location: [facility];  Service: General;  Laterality: Right;  . TOTAL KNEE ARTHROPLASTY  12/16/2011   Procedure: TOTAL KNEE ARTHROPLASTY;  Surgeon: Loanne Drilling, MD;  Location: [facility];  Service: Orthopedics;  Laterality: Right;  . TUBAL LIGATION      SOCIAL HISTORY:  Social History   Socioeconomic History  . Marital status: Married    Spouse name: Not on file  . Number of children: 2  . Years of education: some business school after HS  . Highest education level: Not on file  Occupational History  . Occupation: Veterinary surgeon  . Occupation: Diplomatic Services operational officer   Tobacco Use  . Smoking status: Never Smoker   . Smokeless tobacco: Never Used  Vaping Use  . Vaping Use: Never used  Substance and Sexual Activity  . Alcohol use: No  . Drug use: No  . Sexual activity: Yes    Birth control/protection: None  Other Topics Concern  . Not on file  Social History Narrative   Lives at home with her husband.   4 cups caffeine per day.   Right-handed.   Social Determinants of Health   Financial Resource Strain:   . Difficulty of Paying Living Expenses:   Food Insecurity:   . Worried About Programme researcher, broadcasting/film/video in the Last Year:   . Barista in the Last Year:   Transportation Needs:   . Freight forwarder (Medical):   Marland Kitchen Lack of Transportation (Non-Medical):   Physical Activity:   . Days of Exercise per Week:   . Minutes of Exercise per Session:   Stress:   . Feeling of Stress :   Social Connections:   . Frequency of Communication with Friends and Family:   . Frequency of Social Gatherings with Friends and Family:   . Attends Religious Services:   . Active Member of Clubs or Organizations:   . Attends Banker Meetings:   Marland Kitchen Marital Status:   Intimate Partner Violence:   . Fear of Current or Ex-Partner:   . Emotionally Abused:   Marland Kitchen Physically Abused:   . Sexually Abused:     FAMILY HISTORY:  Family History  Problem Relation Age of Onset  . Heart attack Mother   . Bronchitis Father   . Diabetes Sister   . Leukemia Brother   . Lung disease Brother   . Lung disease Sister     CURRENT MEDICATIONS:  Current Outpatient Medications  Medication Sig Dispense Refill  . Ado-Trastuzumab Emtansine (KADCYLA IV) Inject into the vein.    Marland Kitchen aspirin 325 MG tablet Take 162.5 mg by mouth daily.     . cetirizine (ZYRTEC) 10 MG tablet Take 10 mg by mouth daily.    . hydrocortisone 1 % ointment Apply 1 application topically 2 (two) times daily. 60 g 0  . Misc. Devices MISC Please provide lymphedema sleeve for right arm 2 each 0  . Multiple Vitamin (MULTIVITAMIN WITH MINERALS)  TABS tablet Take 1 tablet by mouth daily.    Letta Pate ULTRA test strip USE 1 STRIP TO CHECK GLUCOSE 2 TO 3 TIMES DAILY    . acetaminophen (TYLENOL) 325 MG tablet Take 650 mg by mouth every 6 (six) hours as needed.  (Patient not taking: Reported on 05/04/2020)    . lidocaine-prilocaine (EMLA) cream Apply to port site one hour prior to appointment and cover with plastic wrap. (Patient not taking: Reported  on 05/04/2020) 30 g 3   No current facility-administered medications for this visit.    ALLERGIES:  Allergies  Allergen Reactions  . Penicillins Rash    Has patient had a PCN reaction causing immediate rash, facial/tongue/throat swelling, SOB or lightheadedness with hypotension: No Has patient had a PCN reaction causing severe rash involving mucus membranes or skin necrosis: No Has patient had a PCN reaction that required hospitalization: No Has patient had a PCN reaction occurring within the last 10 years: No If all of the above answers are "NO", then may proceed with Cephalosporin use.     PHYSICAL EXAM:  Performance status (ECOG): 1 - Symptomatic but completely ambulatory  Vitals:   05/15/20 0842  BP: (!) 149/75  Pulse: 99  Resp: 18  Temp: (!) 97.5 F (36.4 C)  SpO2: 100%   Wt Readings from Last 3 Encounters:  05/15/20 156 lb 11.2 oz (71.1 kg)  05/04/20 155 lb 14.4 oz (70.7 kg)  04/06/20 155 lb 11.2 oz (70.6 kg)   Physical Exam Vitals reviewed.  Constitutional:      Appearance: Normal appearance.  Cardiovascular:     Rate and Rhythm: Normal rate and regular rhythm.     Pulses: Normal pulses.     Heart sounds: Normal heart sounds.  Pulmonary:     Effort: Pulmonary effort is normal.     Breath sounds: Normal breath sounds.  Chest:     Comments: Port on R chest Abdominal:     Palpations: Abdomen is soft. There is no mass.     Tenderness: There is no abdominal tenderness.  Musculoskeletal:     Right lower leg: No edema.     Left lower leg: No edema.  Neurological:      General: No focal deficit present.     Mental Status: She is alert and oriented to person, place, and time.  Psychiatric:        Mood and Affect: Mood normal.        Behavior: Behavior normal.     LABORATORY DATA:  I have reviewed the labs as listed.  CBC Latest Ref Rng & Units 05/15/2020 05/04/2020 04/06/2020  WBC 4.0 - 10.5 K/uL 8.0 7.8 6.6  Hemoglobin 12.0 - 15.0 g/dL 9.7(L) 9.5(L) 10.1(L)  Hematocrit 36 - 46 % 29.8(L) 29.6(L) 31.1(L)  Platelets 150 - 400 K/uL 198 215 205   CMP Latest Ref Rng & Units 05/15/2020 05/04/2020 04/06/2020  Glucose 70 - 99 mg/dL 100(H) 112(H) 105(H)  BUN 8 - 23 mg/dL 28(H) 22 19  Creatinine 0.44 - 1.00 mg/dL 1.26(H) 1.25(H) 1.27(H)  Sodium 135 - 145 mmol/L 139 137 138  Potassium 3.5 - 5.1 mmol/L 3.6 3.3(L) 3.4(L)  Chloride 98 - 111 mmol/L 107 105 106  CO2 22 - 32 mmol/L 20(L) 21(L) 22  Calcium 8.9 - 10.3 mg/dL 9.4 8.8(L) 9.1  Total Protein 6.5 - 8.1 g/dL 6.9 6.7 7.0  Total Bilirubin 0.3 - 1.2 mg/dL 1.0 1.2 1.3(H)  Alkaline Phos 38 - 126 U/L 138(H) 125 140(H)  AST 15 - 41 U/L 68(H) 54(H) 59(H)  ALT 0 - 44 U/L _0 DIAGNOSTIC IMAGING:  I have independently reviewed the scans and discussed with the patient. CT Chest W Contrast  Result Date: 04/26/2020 CLINICAL DATA:  Follow-up metastatic breast cancer. Ongoing chemotherapy. EXAM: CT CHEST, ABDOMEN, AND PELVIS WITH CONTRAST TECHNIQUE: Multidetector CT imaging of the chest, abdomen and pelvis was performed following the standard protocol during bolus administration of intravenous  contrast. CONTRAST:  81m OMNIPAQUE IOHEXOL 300 MG/ML  SOLN COMPARISON:  Chest CT 01/04/2020, PET-CT 09/20/2019 FINDINGS: CT CHEST FINDINGS Cardiovascular: Coronary artery calcification and aortic atherosclerotic calcification. Mediastinum/Nodes: No axillary adenopathy. Enhancing mass within the lateral aspect of the RIGHT breast again noted measuring 3.7 by 2.8 cm compared with 2.7 x 2.3 cm for a measurable increase in size.  Craniocaudad dimension lesion measures 4.3 cm compared to 3.9 cm. Along the superior aspect of the mass there is a small nodule measuring 11 mm (image 47/coronal/5) which is increased from 8 mm. There is skin thickening of the RIGHT breast adjacent to the mass. Lungs/Pleura: No suspicious pulmonary nodules. Musculoskeletal: No aggressive osseous lesion. Osteophytosis and endplate change within the thoracic spine. CT ABDOMEN AND PELVIS FINDINGS Hepatobiliary: No focal hepatic lesion. No biliary ductal dilatation. Gallbladder is normal. Common bile duct is normal. Pancreas: No pancreatic mass or obstruction. Small cystic lesion in the body the pancreas measuring 10 mm (image 64/2) appears benign. Similar lesion in the tail the pancreas is also unchanged. Spleen: Normal spleen Adrenals/urinary tract: Adrenal glands and kidneys are normal. The ureters and bladder normal. Stomach/Bowel: Small hiatal hernia. Probable small distal esophageal diverticulum measuring 18 mm on image 53/2. Stomach, small-bowel cecum normal. Appendix not identified. Ascending and transverse colon normal. Descending colon normal. There is thickened mucosa through the proximal sigmoid colon over a 8 cm segment. There multiple diverticula through this region. Findings similar to comparison exam. Rectum normal. Vascular/Lymphatic: Abdominal aorta is normal caliber with atherosclerotic calcification. There is no retroperitoneal or periportal lymphadenopathy. No pelvic lymphadenopathy. There is mild retroperitoneal fat stranding about the origin SMA (image 66/2). No change prior. Reproductive: Uterus and adnexa unremarkable. Other: No free fluid. Musculoskeletal: No aggressive osseous lesion. IMPRESSION: Chest Impression: 1. Interval increase in size of enhancing lobular mass in the RIGHT breast. 2. Persistent skin thickening superficial to the RIGHT breast mass. 3. No evidence of metastatic adenopathy in the thorax. 4. No suspicious pulmonary  nodules. Abdomen / Pelvis Impression: 1. No evidence of metastatic disease in the abdomen pelvis. 2. No skeletal metastasis. 3. Thickening through the sigmoid colon is favored related to diverticulosis. Electronically Signed   By: SSuzy BouchardM.D.   On: 04/26/2020 13:46   CT Abdomen Pelvis W Contrast  Result Date: 04/26/2020 CLINICAL DATA:  Follow-up metastatic breast cancer. Ongoing chemotherapy. EXAM: CT CHEST, ABDOMEN, AND PELVIS WITH CONTRAST TECHNIQUE: Multidetector CT imaging of the chest, abdomen and pelvis was performed following the standard protocol during bolus administration of intravenous contrast. CONTRAST:  712mOMNIPAQUE IOHEXOL 300 MG/ML  SOLN COMPARISON:  Chest CT 01/04/2020, PET-CT 09/20/2019 FINDINGS: CT CHEST FINDINGS Cardiovascular: Coronary artery calcification and aortic atherosclerotic calcification. Mediastinum/Nodes: No axillary adenopathy. Enhancing mass within the lateral aspect of the RIGHT breast again noted measuring 3.7 by 2.8 cm compared with 2.7 x 2.3 cm for a measurable increase in size. Craniocaudad dimension lesion measures 4.3 cm compared to 3.9 cm. Along the superior aspect of the mass there is a small nodule measuring 11 mm (image 47/coronal/5) which is increased from 8 mm. There is skin thickening of the RIGHT breast adjacent to the mass. Lungs/Pleura: No suspicious pulmonary nodules. Musculoskeletal: No aggressive osseous lesion. Osteophytosis and endplate change within the thoracic spine. CT ABDOMEN AND PELVIS FINDINGS Hepatobiliary: No focal hepatic lesion. No biliary ductal dilatation. Gallbladder is normal. Common bile duct is normal. Pancreas: No pancreatic mass or obstruction. Small cystic lesion in the body the pancreas measuring 10 mm (image 64/2) appears  benign. Similar lesion in the tail the pancreas is also unchanged. Spleen: Normal spleen Adrenals/urinary tract: Adrenal glands and kidneys are normal. The ureters and bladder normal. Stomach/Bowel: Small  hiatal hernia. Probable small distal esophageal diverticulum measuring 18 mm on image 53/2. Stomach, small-bowel cecum normal. Appendix not identified. Ascending and transverse colon normal. Descending colon normal. There is thickened mucosa through the proximal sigmoid colon over a 8 cm segment. There multiple diverticula through this region. Findings similar to comparison exam. Rectum normal. Vascular/Lymphatic: Abdominal aorta is normal caliber with atherosclerotic calcification. There is no retroperitoneal or periportal lymphadenopathy. No pelvic lymphadenopathy. There is mild retroperitoneal fat stranding about the origin SMA (image 66/2). No change prior. Reproductive: Uterus and adnexa unremarkable. Other: No free fluid. Musculoskeletal: No aggressive osseous lesion. IMPRESSION: Chest Impression: 1. Interval increase in size of enhancing lobular mass in the RIGHT breast. 2. Persistent skin thickening superficial to the RIGHT breast mass. 3. No evidence of metastatic adenopathy in the thorax. 4. No suspicious pulmonary nodules. Abdomen / Pelvis Impression: 1. No evidence of metastatic disease in the abdomen pelvis. 2. No skeletal metastasis. 3. Thickening through the sigmoid colon is favored related to diverticulosis. Electronically Signed   By: Suzy Bouchard M.D.   On: 04/26/2020 13:46     ASSESSMENT:  1. Metastatic right breast cancer to the skin, ER/PR negative, HER-2 positive: -Presentation with skin involvement of the right breast, axillary region and posterior chest wall. -12 cycles of weekly Taxol and Herceptin from 07/31/2018 through 10/30/2018 with PET scan showing progression. -Kadcyla from 02/15/2019 through 04/06/2020 with progression. -CT CAP on 04/26/2020 shows right breast mass measuring 3.7 x 2.8 (2.7 x 2.3).  CC dimension measures 4.3 cm (3.9 cm).  No new areas of metastatic disease.  No skeletal metastasis. -Enhertu started on 05/15/2020 at 3.4 mg/kg.  2.  High risk drug monitoring:  -2D echo on 04/03/2020 shows EF 65 to 70%.   PLAN:  1. Metastatic right breast cancer to the skin, ER/PR negative, HER-2 positive: -I have reviewed her labs today.  Mild elevation of AST.  Total bilirubin normalized.  CBC was grossly normal. -We talked about the side effects of Enhertu in detail including rare chance of pulmonary toxicity. -We will proceed with her treatment today.  We will reevaluate her in 1 week.  I will reevaluate her in 3 weeks prior to next treatment.  2. High risk drug monitoring: -She does not have any symptoms of PND or orthopnea.  We will continue to monitor echocardiogram every 3 months.  3. Hypokalemia: -Continue potassium 20 mEq daily.  Potassium today is normal.  4. Skin rash: -Continue steroid cream on the lower back for itching.  5.  Elevated LFTs: -AST slightly went up to 68.  ALT and total bilirubin were normal.  We will closely monitor.  6.  CKD: -Mildly elevated creatinine of 1.26 and stable.  Continue to avoid nephrotoxins.   Orders placed this encounter:  No orders of the defined types were placed in this encounter.    Derek Jack, MD [facility] [phone]   I, Milinda Antis, am acting as a scribe for Dr. Sanda Linger.  I, Derek Jack MD, have reviewed the above documentation for accuracy and completeness, and I agree with the above.

## 2020-05-15 NOTE — Patient Instructions (Signed)
[facility] Discharge Instructions for Patients Receiving Chemotherapy  Today you received the following chemotherapy agents   To help prevent nausea and vomiting after your treatment, we encourage you to take your nausea medication   If you develop nausea and vomiting that is not controlled by your nausea medication, call the clinic.   BELOW ARE SYMPTOMS THAT SHOULD BE REPORTED IMMEDIATELY:  *FEVER GREATER THAN 100.5 F  *CHILLS WITH OR WITHOUT FEVER  NAUSEA AND VOMITING THAT IS NOT CONTROLLED WITH YOUR NAUSEA MEDICATION  *UNUSUAL SHORTNESS OF BREATH  *UNUSUAL BRUISING OR BLEEDING  TENDERNESS IN MOUTH AND THROAT WITH OR WITHOUT PRESENCE OF ULCERS  *URINARY PROBLEMS  *BOWEL PROBLEMS  UNUSUAL RASH Items with * indicate a potential emergency and should be followed up as soon as possible.  Feel free to call the clinic should you have any questions or concerns. The clinic phone number is [phone].  Please show the CHEMO ALERT CARD at check-in to the Emergency Department and triage nurse.   

## 2020-05-15 NOTE — Progress Notes (Signed)
Patient presents today for treatment and follow up visit with Dr.Katragadda. Labs pending. Vital signs within parameters for treatment. Patient denies any pain today. Patient denies any changes since her last visit.   Labs within parameters for treatment.   Treatment given today per MD orders. Tolerated infusion without adverse affects. Vital signs stable. No complaints at this time. Discharged from clinic via wheel chair. F/U with [facility] as scheduled.

## 2020-05-16 NOTE — Progress Notes (Signed)
24 hour call back. Spoke with patient's husband. Patient has done well through the night and today. No nausea, diarrhea, vomiting or rashes noted. Patient has been eating and drinking with no issues. Husband states the patient was concerned due to she did not have a BM today. Last BM was yesterday 05/15/20. Patient denies any abdominal pain or discomfort. Patient teaching performed pertaining to Colace administration. Understanding verbalized.

## 2020-05-23 ENCOUNTER — Other Ambulatory Visit: Payer: Self-pay

## 2020-05-23 ENCOUNTER — Inpatient Hospital Stay (HOSPITAL_BASED_OUTPATIENT_CLINIC_OR_DEPARTMENT_OTHER): Payer: Medicare HMO | Admitting: Nurse Practitioner

## 2020-05-23 ENCOUNTER — Inpatient Hospital Stay (HOSPITAL_COMMUNITY): Payer: Medicare HMO

## 2020-05-23 DIAGNOSIS — Z171 Estrogen receptor negative status [ER-]: Secondary | ICD-10-CM

## 2020-05-23 DIAGNOSIS — Z5112 Encounter for antineoplastic immunotherapy: Secondary | ICD-10-CM | POA: Diagnosis not present

## 2020-05-23 DIAGNOSIS — C50411 Malignant neoplasm of upper-outer quadrant of right female breast: Secondary | ICD-10-CM

## 2020-05-23 LAB — COMPREHENSIVE METABOLIC PANEL
ALT: 25 U/L (ref 0–44)
AST: 48 U/L — ABNORMAL HIGH (ref 15–41)
Albumin: 2.8 g/dL — ABNORMAL LOW (ref 3.5–5.0)
Alkaline Phosphatase: 121 U/L (ref 38–126)
Anion gap: 10 (ref 5–15)
BUN: 31 mg/dL — ABNORMAL HIGH (ref 8–23)
CO2: 19 mmol/L — ABNORMAL LOW (ref 22–32)
Calcium: 8.9 mg/dL (ref 8.9–10.3)
Chloride: 106 mmol/L (ref 98–111)
Creatinine, Ser: 1.26 mg/dL — ABNORMAL HIGH (ref 0.44–1.00)
GFR calc Af Amer: 44 mL/min — ABNORMAL LOW (ref >60)
GFR calc non Af Amer: 38 mL/min — ABNORMAL LOW (ref >60)
Glucose, Bld: 95 mg/dL (ref 70–99)
Potassium: 3.5 mmol/L (ref 3.5–5.1)
Sodium: 135 mmol/L (ref 135–145)
Total Bilirubin: 1.1 mg/dL (ref 0.3–1.2)
Total Protein: 6.2 g/dL — ABNORMAL LOW (ref 6.5–8.1)

## 2020-05-23 LAB — CBC WITH DIFFERENTIAL/PLATELET
Abs Immature Granulocytes: 0.05 10*3/uL (ref 0.00–0.07)
Basophils Absolute: 0.1 10*3/uL (ref 0.0–0.1)
Basophils Relative: 1 %
Eosinophils Absolute: 0.9 10*3/uL — ABNORMAL HIGH (ref 0.0–0.5)
Eosinophils Relative: 9 %
HCT: 25.1 % — ABNORMAL LOW (ref 36.0–46.0)
Hemoglobin: 8.3 g/dL — ABNORMAL LOW (ref 12.0–15.0)
Immature Granulocytes: 1 %
Lymphocytes Relative: 34 %
Lymphs Abs: 3.5 10*3/uL (ref 0.7–4.0)
MCH: 29.4 pg (ref 26.0–34.0)
MCHC: 33.1 g/dL (ref 30.0–36.0)
MCV: 89 fL (ref 80.0–100.0)
Monocytes Absolute: 1.1 10*3/uL — ABNORMAL HIGH (ref 0.1–1.0)
Monocytes Relative: 10 %
Neutro Abs: 4.7 10*3/uL (ref 1.7–7.7)
Neutrophils Relative %: 45 %
Platelets: 250 10*3/uL (ref 150–400)
RBC: 2.82 MIL/uL — ABNORMAL LOW (ref 3.87–5.11)
RDW: 19.1 % — ABNORMAL HIGH (ref 11.5–15.5)
WBC: 10.2 10*3/uL (ref 4.0–10.5)
nRBC: 0 % (ref 0.0–0.2)

## 2020-05-23 MED ORDER — HEPARIN SOD (PORK) LOCK FLUSH 100 UNIT/ML IV SOLN
500.0000 [IU] | Freq: Once | INTRAVENOUS | Status: AC
  Administered 2020-05-23: 500 [IU] via INTRAVENOUS

## 2020-05-23 MED ORDER — SODIUM CHLORIDE 0.9% FLUSH
10.0000 mL | INTRAVENOUS | Status: AC | PRN
  Administered 2020-05-23: 10 mL via INTRAVENOUS

## 2020-05-23 NOTE — Progress Notes (Signed)
[address], [place] [address]   CLINIC:  Medical Oncology/Hematology  PCP:  [name], MD [place] [address] [phone]   REASON FOR VISIT: Follow-up for metastatic breast cancer   BRIEF ONCOLOGIC HISTORY:  Oncology History  Malignant neoplasm of upper-outer quadrant of right breast in female, estrogen receptor negative ([place])  07/03/2018 Initial Diagnosis   Palpable right breast mass for several months with skin discoloration, clinically skin trabecular thickening with nipple retraction measuring 7.2 x 4.3 x 4.2 cm, additional mass 4 o'clock position 2.6 cm, right axillary tail 1.2 cm right axillary lymph node 1.8 cm left breast irregular mass 1 cm, adjacent mass 0.9 cm together measuring 1.9 cm, no lymphadenopathy   07/03/2018 Pathology Results   Right breast biopsy: IDC grade 2, ER 0%, PR 0%, HER-2 positive, Ki-67 40%, lymph node biopsy negative, left breast biopsy complex sclerosing lesion    07/15/2018 Cancer Staging   Staging form: Breast, AJCC 8th Edition - Clinical: Stage IIB (cT3, cN0, cM0, G2, ER-, PR-, HER2+) - Signed by [name], MD on 07/15/2018   07/31/2018 - 10/30/2018 Chemotherapy   The patient had trastuzumab (HERCEPTIN) 336 mg in sodium chloride 0.9 % 250 mL chemo infusion, 4 mg/kg = 336 mg, Intravenous,  Once, 4 of 4 cycles Administration: 336 mg (07/31/2018), 168 mg (08/07/2018), 168 mg (08/28/2018), 168 mg (08/14/2018), 168 mg (08/21/2018), 168 mg (09/04/2018), 168 mg (09/11/2018), 168 mg (09/18/2018), 168 mg (10/02/2018), 168 mg (10/09/2018), 168 mg (10/16/2018), 168 mg (10/23/2018), 168 mg (10/30/2018) PACLitaxel (TAXOL) 78 mg in sodium chloride 0.9 % 250 mL chemo infusion (</= 70m/m2), 40 mg/m2 = 78 mg (50 % of original dose 80 mg/m2), Intravenous,  Once, 4 of 4 cycles Dose modification: 40 mg/m2 (50 % of original dose 80 mg/m2, Cycle 1, Reason: Patient Age), 53.3333 mg/m2 (66.7 % of original dose 80  mg/m2, Cycle 1, Reason: Provider Judgment), 45 mg/m2 (original dose 80 mg/m2, Cycle 3, Reason: Provider Judgment) Administration: 78 mg (07/31/2018), 102 mg (08/07/2018), 102 mg (08/28/2018), 102 mg (08/14/2018), 102 mg (08/21/2018), 102 mg (09/04/2018), 102 mg (09/18/2018), 84 mg (10/02/2018), 84 mg (10/09/2018), 84 mg (10/16/2018), 84 mg (10/23/2018), 84 mg (10/30/2018)  for chemotherapy treatment.    11/13/2018 - 02/14/2019 Chemotherapy   The patient had trastuzumab (HERCEPTIN) 504 mg in sodium chloride 0.9 % 250 mL chemo infusion, 6 mg/kg = 504 mg (100 % of original dose 6 mg/kg), Intravenous,  Once, 4 of 5 cycles Dose modification: 6 mg/kg (original dose 6 mg/kg, Cycle 1, Reason: Other (see comments), Comment: pt receving it ) Administration: 504 mg (11/13/2018), 504 mg (12/04/2018), 504 mg (01/04/2019), 504 mg (01/25/2019)  for chemotherapy treatment.    02/15/2019 - 04/06/2020 Chemotherapy   The patient had ado-trastuzumab emtansine (KADCYLA) 240 mg in sodium chloride 0.9 % 250 mL chemo infusion, 3 mg/kg = 240 mg (100 % of original dose 3 mg/kg), Intravenous, Once, 17 of 18 cycles Dose modification: 3 mg/kg (original dose 3 mg/kg, Cycle 1, Reason: Provider Judgment), 3 mg/kg (original dose 3 mg/kg, Cycle 2, Reason: Provider Judgment), 2.4 mg/kg (original dose 3 mg/kg, Cycle 11, Reason: Other (see comments), Comment: bili upper limit of normal), 2.4 mg/kg (original dose 3 mg/kg, Cycle 12, Reason: Other (see comments), Comment: elevated bilirubin) Administration: 240 mg (02/15/2019), 240 mg (03/08/2019), 240 mg (05/10/2019), 240 mg (03/29/2019), 240 mg (04/19/2019), 240 mg (06/01/2019), 240 mg (06/22/2019), 240 mg (07/20/2019), 240 mg (08/10/2019), 240 mg (09/01/2019),  200 mg (09/22/2019), 180 mg (11/03/2019), 180 mg (12/09/2019), 180 mg (01/06/2020), 180 mg (02/03/2020), 180 mg (03/09/2020), 180 mg (04/06/2020)  for chemotherapy treatment.    05/15/2020 -  Chemotherapy   The patient had dexamethasone (DECADRON) 4 MG tablet, 8  mg, Oral, Daily, 1 of 1 cycle, Start date: --, End date: -- palonosetron (ALOXI) injection 0.25 mg, 0.25 mg, Intravenous,  Once, 1 of 6 cycles Administration: 0.25 mg (05/15/2020) fam-trastuzumab deruxtecan-nxki (ENHERTU) 226 mg in dextrose 5 % 100 mL chemo infusion, 3.2 mg/kg = 226 mg (59.3 % of original dose 5.4 mg/kg), Intravenous,  Once, 1 of 6 cycles Dose modification: 3.2 mg/kg (original dose 5.4 mg/kg, Cycle 1, Reason: Provider Judgment) Administration: 226 mg (05/15/2020)  for chemotherapy treatment.      CANCER STAGING: Cancer Staging Malignant neoplasm of upper-outer quadrant of right breast in female, estrogen receptor negative (HCC) Staging form: Breast, AJCC 8th Edition - Clinical: Stage IIB (cT3, cN0, cM0, G2, ER-, PR-, HER2+) - Signed by Gudena, Vinay, MD on 07/15/2018    INTERVAL HISTORY:  Ms. Blixt 86 y.o. female returns for routine follow-up metastatic breast cancer.  Patient reports she is doing well since her last treatment.  She has no breathing problems.  She is not short of breath.  She has no new cough. Denies any nausea, vomiting, or diarrhea. Denies any new pains. Had not noticed any recent bleeding such as epistaxis, hematuria or hematochezia. Denies recent chest pain on exertion, shortness of breath on minimal exertion, pre-syncopal episodes, or palpitations. Denies any numbness or tingling in hands or feet. Denies any recent fevers, infections, or recent hospitalizations. Patient reports appetite at 100% and energy level at 50%.  She is eating well maintain her weight this time.     REVIEW OF SYSTEMS:  Review of Systems  All other systems reviewed and are negative.    PAST MEDICAL/SURGICAL HISTORY:  Past Medical History:  Diagnosis Date  . Cancer (HCC) 06/2018   right breast cancer  . GERD (gastroesophageal reflux disease)    occasional   . Hyperlipidemia   . Hypertension    clearance with note Dr Hall on chart  . Pneumonia    2 years ago/ states occ  cough with sinus drainage- no fever  . Thyroid nodule    with biopsy- states following medically   Past Surgical History:  Procedure Laterality Date  . CATARACT EXTRACTION W/PHACO  11/30/2012   Procedure: CATARACT EXTRACTION PHACO AND INTRAOCULAR LENS PLACEMENT (IOC);  Surgeon: Carroll F Haines, MD;  Location: [facility];  Service: Ophthalmology;  Laterality: Left;  CDE:11.49  . CATARACT EXTRACTION W/PHACO Right 03/15/2013   Procedure: CATARACT EXTRACTION PHACO AND INTRAOCULAR LENS PLACEMENT (IOC);  Surgeon: Carroll F Haines, MD;  Location: [facility];  Service: Ophthalmology;  Laterality: Right;  CDE 16.15  . COLONOSCOPY    . EXCISION OF SKIN TAG Right 06/17/2016   Procedure: SHAVE OF SKIN TAG RIGHT BUTTOCK;  Surgeon: Mark Jenkins, MD;  Location: [facility];  Service: General;  Laterality: Right;  . JOINT REPLACEMENT     left knee  . MASS EXCISION Left 06/17/2016   Procedure: EXCISION SKIN MALIGNANT LESION LEFT BUTTOCK;  Surgeon: Mark Jenkins, MD;  Location: [facility];  Service: General;  Laterality: Left;  . PORTACATH PLACEMENT Right 07/28/2018   Procedure: INSERTION PORT-A-CATH WITH ULTRASOUND;  Surgeon: Wakefield, Matthew, MD;  Location: [facility];  Service: General;  Laterality: Right;  . PUNCH BIOPSY OF SKIN Right 07/28/2018   Procedure: PUNCH   BIOPSY OF SKIN RIGHT BREAST;  Surgeon: Wakefield, Matthew, MD;  Location: [facility];  Service: General;  Laterality: Right;  . TOTAL KNEE ARTHROPLASTY  12/16/2011   Procedure: TOTAL KNEE ARTHROPLASTY;  Surgeon: Frank V Aluisio, MD;  Location: [facility];  Service: Orthopedics;  Laterality: Right;  . TUBAL LIGATION       SOCIAL HISTORY:  Social History   Socioeconomic History  . Marital status: Married    Spouse name: Not on file  . Number of children: 2  . Years of education: some business school after HS  . Highest education level: Not on file  Occupational History  . Occupation: Realtor  . Occupation: Secretary   Tobacco Use    . Smoking status: Never Smoker  . Smokeless tobacco: Never Used  Vaping Use  . Vaping Use: Never used  Substance and Sexual Activity  . Alcohol use: No  . Drug use: No  . Sexual activity: Yes    Birth control/protection: None  Other Topics Concern  . Not on file  Social History Narrative   Lives at home with her husband.   4 cups caffeine per day.   Right-handed.   Social Determinants of Health   Financial Resource Strain:   . Difficulty of Paying Living Expenses:   Food Insecurity:   . Worried About Running Out of Food in the Last Year:   . Ran Out of Food in the Last Year:   Transportation Needs:   . Lack of Transportation (Medical):   . Lack of Transportation (Non-Medical):   Physical Activity:   . Days of Exercise per Week:   . Minutes of Exercise per Session:   Stress:   . Feeling of Stress :   Social Connections:   . Frequency of Communication with Friends and Family:   . Frequency of Social Gatherings with Friends and Family:   . Attends Religious Services:   . Active Member of Clubs or Organizations:   . Attends Club or Organization Meetings:   . Marital Status:   Intimate Partner Violence:   . Fear of Current or Ex-Partner:   . Emotionally Abused:   . Physically Abused:   . Sexually Abused:     FAMILY HISTORY:  Family History  Problem Relation Age of Onset  . Heart attack Mother   . Bronchitis Father   . Diabetes Sister   . Leukemia Brother   . Lung disease Brother   . Lung disease Sister     CURRENT MEDICATIONS:  Outpatient Encounter Medications as of 05/23/2020  Medication Sig  . Ado-Trastuzumab Emtansine (KADCYLA IV) Inject into the vein.  . aspirin 325 MG tablet Take 162.5 mg by mouth daily.   . cetirizine (ZYRTEC) 10 MG tablet Take 10 mg by mouth daily.  . hydrocortisone 1 % ointment Apply 1 application topically 2 (two) times daily.  . Misc. Devices MISC Please provide lymphedema sleeve for right arm  . Multiple Vitamin (MULTIVITAMIN  WITH MINERALS) TABS tablet Take 1 tablet by mouth daily.  . ONETOUCH ULTRA test strip USE 1 STRIP TO CHECK GLUCOSE 2 TO 3 TIMES DAILY  . acetaminophen (TYLENOL) 325 MG tablet Take 650 mg by mouth every 6 (six) hours as needed.  (Patient not taking: Reported on 05/04/2020)  . lidocaine-prilocaine (EMLA) cream Apply to port site one hour prior to appointment and cover with plastic wrap. (Patient not taking: Reported on 05/04/2020)   No facility-administered encounter medications on file as of 05/23/2020.      ALLERGIES:  Allergies  Allergen Reactions  . Penicillins Rash    Has patient had a PCN reaction causing immediate rash, facial/tongue/throat swelling, SOB or lightheadedness with hypotension: No Has patient had a PCN reaction causing severe rash involving mucus membranes or skin necrosis: No Has patient had a PCN reaction that required hospitalization: No Has patient had a PCN reaction occurring within the last 10 years: No If all of the above answers are "NO", then may proceed with Cephalosporin use.      PHYSICAL EXAM:  ECOG Performance status: 1  Vitals:   05/23/20 0940  BP: (!) 152/66  Resp: 18  Temp: (!) 97.1 F (36.2 C)  SpO2: 98%   Filed Weights   05/23/20 0940  Weight: 159 lb 1.6 oz (72.2 kg)   Physical Exam Constitutional:      Appearance: Normal appearance. She is normal weight.  Cardiovascular:     Rate and Rhythm: Normal rate and regular rhythm.     Heart sounds: Normal heart sounds.  Pulmonary:     Effort: Pulmonary effort is normal.     Breath sounds: Normal breath sounds.  Abdominal:     General: Bowel sounds are normal.     Palpations: Abdomen is soft.  Musculoskeletal:        General: Normal range of motion.  Skin:    General: Skin is warm.  Neurological:     Mental Status: She is alert and oriented to person, place, and time. Mental status is at baseline.  Psychiatric:        Mood and Affect: Mood normal.        Behavior: Behavior normal.         Thought Content: Thought content normal.        Judgment: Judgment normal.      LABORATORY DATA:  I have reviewed the labs as listed.  CBC    Component Value Date/Time   WBC 10.2 05/23/2020 0900   RBC 2.82 (L) 05/23/2020 0900   HGB 8.3 (L) 05/23/2020 0900   HGB 12.7 11/24/2017 1626   HCT 25.1 (L) 05/23/2020 0900   HCT 36.6 11/24/2017 1626   PLT 250 05/23/2020 0900   PLT 420 (H) 11/24/2017 1626   MCV 89.0 05/23/2020 0900   MCV 88 11/24/2017 1626   MCH 29.4 05/23/2020 0900   MCHC 33.1 05/23/2020 0900   RDW 19.1 (H) 05/23/2020 0900   RDW 15.4 11/24/2017 1626   LYMPHSABS 3.5 05/23/2020 0900   LYMPHSABS 2.5 11/24/2017 1626   MONOABS 1.1 (H) 05/23/2020 0900   EOSABS 0.9 (H) 05/23/2020 0900   EOSABS 0.1 11/24/2017 1626   BASOSABS 0.1 05/23/2020 0900   BASOSABS 0.0 11/24/2017 1626   CMP Latest Ref Rng & Units 05/23/2020 05/15/2020 05/04/2020  Glucose 70 - 99 mg/dL 95 100(H) 112(H)  BUN 8 - 23 mg/dL 31(H) 28(H) 22  Creatinine 0.44 - 1.00 mg/dL 1.26(H) 1.26(H) 1.25(H)  Sodium 135 - 145 mmol/L 135 139 137  Potassium 3.5 - 5.1 mmol/L 3.5 3.6 3.3(L)  Chloride 98 - 111 mmol/L 106 107 105  CO2 22 - 32 mmol/L 19(L) 20(L) 21(L)  Calcium 8.9 - 10.3 mg/dL 8.9 9.4 8.8(L)  Total Protein 6.5 - 8.1 g/dL 6.2(L) 6.9 6.7  Total Bilirubin 0.3 - 1.2 mg/dL 1.1 1.0 1.2  Alkaline Phos 38 - 126 U/L 121 138(H) 125  AST 15 - 41 U/L 48(H) 68(H) 54(H)  ALT 0 - 44 U/L _0 All questions were answered to  patient's stated satisfaction. Encouraged patient to call with any new concerns or questions before his next visit to the cancer center and we can certain see him sooner, if needed.     ASSESSMENT & PLAN:  Malignant neoplasm of upper-outer quadrant of right breast in female, estrogen receptor negative (HCC) 1.  Metastatic right breast cancer to the skin: -ER/PR negative, HER-2 positive -Presentation with skin involvement of the right breast, axillary region and posterior chest wall. -12 cycles of  weekly Taxol and Herceptin from 07/31/2018 through 10/30/2018 with PET scan showing progression. -Kadcyla from 02/15/2019 through 04/06/2020 with progression. -CT CAP on 04/26/2020 showed right breast mass measuring 3.7x2.8.  CC dimension measured 4.3 cm.  No new areas of metastatic disease.  No skeletal metastasis. -Enhertu started on 05/15/2020 at 3.4 mg/kg. -Labs done on 05/23/2020 showed mild elevation of AST of 48.  Total bilirubin was normalized.  CBC showed WBC 10.2, hemoglobin 8.3, platelets 250. -We will follow-up with her next treatment.  2.  High risk drug monitoring: -2D echo on 04/03/2020 showed EF 65 to 70%. -She does not have any symptoms of PND or orthopnea. -We will continue to monitor echocardiograms every 3 months.  3.  Hypokalemia: -Continue potassium 20 mEq daily. -Labs done on 05/23/2020 showed potassium 3.5  4.  Skin rash: -Continue steroid cream on lower back for itching.  5.  Elevated LFTs: -AST slightly went up to 68.  ALT and total bilirubin were normal.  We will closely monitor  6.  CKD: -Mildly elevated creatinine of 1.26 -Continue to avoid nephrotoxins.     Orders placed this encounter:  Orders Placed This Encounter  Procedures  . Lactate dehydrogenase  . Magnesium  . CBC with Differential/Platelet  . Comprehensive metabolic panel       , FNP-C [facility] [phone]  

## 2020-05-23 NOTE — Addendum Note (Signed)
Addended by: Anselmo Rod on: 05/23/2020 11:06 AM   Modules accepted: Orders

## 2020-05-23 NOTE — Patient Instructions (Signed)
[facility] at [facility] Discharge Instructions  Labs drawn from portacath today   Thank you for choosing [facility] at [facility] to provide your oncology and hematology care.  To afford each patient quality time with our provider, please arrive at least 15 minutes before your scheduled appointment time.   If you have a lab appointment with the Cancer Center please come in thru the Main Entrance and check in at the main information desk.  You need to re-schedule your appointment should you arrive 10 or more minutes late.  We strive to give you quality time with our providers, and arriving late affects you and other patients whose appointments are after yours.  Also, if you no show three or more times for appointments you may be dismissed from the clinic at the providers discretion.     Again, thank you for choosing [facility].  Our hope is that these requests will decrease the amount of time that you wait before being seen by our physicians.       _____________________________________________________________  Should you have questions after your visit to [facility], please contact our office at [phone] and follow the prompts.  Our office hours are 8:00 a.m. and 4:30 p.m. Monday - Friday.  Please note that voicemails left after 4:00 p.m. may not be returned until the following business day.  We are closed weekends and major holidays.  You do have access to a nurse 24-7, just call the main number to the clinic [phone] and do not press any options, hold on the line and a nurse will answer the phone.    For prescription refill requests, have your pharmacy contact our office and allow 72 hours.    Due to Covid, you will need to wear a mask upon entering the hospital. If you do not have a mask, a mask will be given to you at the Main Entrance upon arrival. For doctor visits, patients may have 1 support person age 18  or older with them. For treatment visits, patients can not have anyone with them due to social distancing guidelines and our immunocompromised population.     

## 2020-05-23 NOTE — Assessment & Plan Note (Addendum)
1.  Metastatic right breast cancer to the skin: -ER/PR negative, HER-2 positive -Presentation with skin involvement of the right breast, axillary region and posterior chest wall. -12 cycles of weekly Taxol and Herceptin from 07/31/2018 through 10/30/2018 with PET scan showing progression. -Kadcyla from 02/15/2019 through 04/06/2020 with progression. -CT CAP on 04/26/2020 showed right breast mass measuring 3.7x2.8.  CC dimension measured 4.3 cm.  No new areas of metastatic disease.  No skeletal metastasis. -Enhertu started on 05/15/2020 at 3.4 mg/kg. -Labs done on 05/23/2020 showed mild elevation of AST of 48.  Total bilirubin was normalized.  CBC showed WBC 10.2, hemoglobin 8.3, platelets 250. -We will follow-up with her next treatment.  2.  High risk drug monitoring: -2D echo on 04/03/2020 showed EF 65 to 70%. -She does not have any symptoms of PND or orthopnea. -We will continue to monitor echocardiograms every 3 months.  3.  Hypokalemia: -Continue potassium 20 mEq daily. -Labs done on 05/23/2020 showed potassium 3.5  4.  Skin rash: -Continue steroid cream on lower back for itching.  5.  Elevated LFTs: -AST slightly went up to 68.  ALT and total bilirubin were normal.  We will closely monitor  6.  CKD: -Mildly elevated creatinine of 1.26 -Continue to avoid nephrotoxins.

## 2020-06-05 ENCOUNTER — Inpatient Hospital Stay (HOSPITAL_COMMUNITY): Payer: Medicare HMO | Attending: Hematology

## 2020-06-05 ENCOUNTER — Inpatient Hospital Stay (HOSPITAL_COMMUNITY): Payer: Medicare HMO

## 2020-06-05 ENCOUNTER — Other Ambulatory Visit: Payer: Self-pay

## 2020-06-05 ENCOUNTER — Inpatient Hospital Stay (HOSPITAL_BASED_OUTPATIENT_CLINIC_OR_DEPARTMENT_OTHER): Payer: Medicare HMO | Admitting: Hematology

## 2020-06-05 VITALS — BP 134/64 | HR 98 | Temp 97.3°F | Resp 18 | Wt 163.6 lb

## 2020-06-05 VITALS — BP 132/63 | HR 90 | Temp 98.0°F | Resp 18

## 2020-06-05 DIAGNOSIS — I129 Hypertensive chronic kidney disease with stage 1 through stage 4 chronic kidney disease, or unspecified chronic kidney disease: Secondary | ICD-10-CM | POA: Diagnosis not present

## 2020-06-05 DIAGNOSIS — Z806 Family history of leukemia: Secondary | ICD-10-CM | POA: Insufficient documentation

## 2020-06-05 DIAGNOSIS — E876 Hypokalemia: Secondary | ICD-10-CM | POA: Insufficient documentation

## 2020-06-05 DIAGNOSIS — N189 Chronic kidney disease, unspecified: Secondary | ICD-10-CM | POA: Insufficient documentation

## 2020-06-05 DIAGNOSIS — Z79899 Other long term (current) drug therapy: Secondary | ICD-10-CM | POA: Diagnosis not present

## 2020-06-05 DIAGNOSIS — Z171 Estrogen receptor negative status [ER-]: Secondary | ICD-10-CM

## 2020-06-05 DIAGNOSIS — Z801 Family history of malignant neoplasm of trachea, bronchus and lung: Secondary | ICD-10-CM | POA: Insufficient documentation

## 2020-06-05 DIAGNOSIS — R748 Abnormal levels of other serum enzymes: Secondary | ICD-10-CM | POA: Insufficient documentation

## 2020-06-05 DIAGNOSIS — Z5112 Encounter for antineoplastic immunotherapy: Secondary | ICD-10-CM | POA: Insufficient documentation

## 2020-06-05 DIAGNOSIS — C50411 Malignant neoplasm of upper-outer quadrant of right female breast: Secondary | ICD-10-CM | POA: Insufficient documentation

## 2020-06-05 DIAGNOSIS — R21 Rash and other nonspecific skin eruption: Secondary | ICD-10-CM | POA: Insufficient documentation

## 2020-06-05 DIAGNOSIS — E041 Nontoxic single thyroid nodule: Secondary | ICD-10-CM | POA: Insufficient documentation

## 2020-06-05 DIAGNOSIS — E785 Hyperlipidemia, unspecified: Secondary | ICD-10-CM | POA: Insufficient documentation

## 2020-06-05 DIAGNOSIS — I10 Essential (primary) hypertension: Secondary | ICD-10-CM | POA: Insufficient documentation

## 2020-06-05 DIAGNOSIS — K219 Gastro-esophageal reflux disease without esophagitis: Secondary | ICD-10-CM | POA: Insufficient documentation

## 2020-06-05 LAB — COMPREHENSIVE METABOLIC PANEL
ALT: 21 U/L (ref 0–44)
AST: 44 U/L — ABNORMAL HIGH (ref 15–41)
Albumin: 2.8 g/dL — ABNORMAL LOW (ref 3.5–5.0)
Alkaline Phosphatase: 112 U/L (ref 38–126)
Anion gap: 10 (ref 5–15)
BUN: 24 mg/dL — ABNORMAL HIGH (ref 8–23)
CO2: 19 mmol/L — ABNORMAL LOW (ref 22–32)
Calcium: 8.4 mg/dL — ABNORMAL LOW (ref 8.9–10.3)
Chloride: 107 mmol/L (ref 98–111)
Creatinine, Ser: 1.11 mg/dL — ABNORMAL HIGH (ref 0.44–1.00)
GFR calc Af Amer: 51 mL/min — ABNORMAL LOW (ref >60)
GFR calc non Af Amer: 44 mL/min — ABNORMAL LOW (ref >60)
Glucose, Bld: 90 mg/dL (ref 70–99)
Potassium: 3.5 mmol/L (ref 3.5–5.1)
Sodium: 136 mmol/L (ref 135–145)
Total Bilirubin: 1.1 mg/dL (ref 0.3–1.2)
Total Protein: 6.2 g/dL — ABNORMAL LOW (ref 6.5–8.1)

## 2020-06-05 LAB — MAGNESIUM: Magnesium: 1.8 mg/dL (ref 1.7–2.4)

## 2020-06-05 LAB — CBC WITH DIFFERENTIAL/PLATELET
Abs Immature Granulocytes: 0.01 10*3/uL (ref 0.00–0.07)
Basophils Absolute: 0.1 10*3/uL (ref 0.0–0.1)
Basophils Relative: 1 %
Eosinophils Absolute: 0.7 10*3/uL — ABNORMAL HIGH (ref 0.0–0.5)
Eosinophils Relative: 12 %
HCT: 24.8 % — ABNORMAL LOW (ref 36.0–46.0)
Hemoglobin: 8 g/dL — ABNORMAL LOW (ref 12.0–15.0)
Immature Granulocytes: 0 %
Lymphocytes Relative: 32 %
Lymphs Abs: 1.9 10*3/uL (ref 0.7–4.0)
MCH: 29.3 pg (ref 26.0–34.0)
MCHC: 32.3 g/dL (ref 30.0–36.0)
MCV: 90.8 fL (ref 80.0–100.0)
Monocytes Absolute: 1 10*3/uL (ref 0.1–1.0)
Monocytes Relative: 16 %
Neutro Abs: 2.4 10*3/uL (ref 1.7–7.7)
Neutrophils Relative %: 39 %
Platelets: 184 10*3/uL (ref 150–400)
RBC: 2.73 MIL/uL — ABNORMAL LOW (ref 3.87–5.11)
RDW: 19.9 % — ABNORMAL HIGH (ref 11.5–15.5)
WBC: 6 10*3/uL (ref 4.0–10.5)
nRBC: 0 % (ref 0.0–0.2)

## 2020-06-05 LAB — IRON AND TIBC
Iron: 36 ug/dL (ref 28–170)
Saturation Ratios: 8 % — ABNORMAL LOW (ref 10.4–31.8)
TIBC: 467 ug/dL — ABNORMAL HIGH (ref 250–450)
UIBC: 431 ug/dL

## 2020-06-05 LAB — FOLATE: Folate: 18.4 ng/mL (ref >5.9)

## 2020-06-05 LAB — FERRITIN: Ferritin: 21 ng/mL (ref 11–307)

## 2020-06-05 LAB — VITAMIN B12: Vitamin B-12: 973 pg/mL — ABNORMAL HIGH (ref 180–914)

## 2020-06-05 LAB — LACTATE DEHYDROGENASE: LDH: 184 U/L (ref 98–192)

## 2020-06-05 MED ORDER — ACETAMINOPHEN 325 MG PO TABS
650.0000 mg | ORAL_TABLET | Freq: Once | ORAL | Status: AC
  Administered 2020-06-05: 650 mg via ORAL
  Filled 2020-06-05: qty 2

## 2020-06-05 MED ORDER — DIPHENHYDRAMINE HCL 25 MG PO CAPS
50.0000 mg | ORAL_CAPSULE | Freq: Once | ORAL | Status: AC
  Administered 2020-06-05: 50 mg via ORAL
  Filled 2020-06-05: qty 2

## 2020-06-05 MED ORDER — SODIUM CHLORIDE 0.9% FLUSH
10.0000 mL | INTRAVENOUS | Status: DC | PRN
  Administered 2020-06-05: 10 mL

## 2020-06-05 MED ORDER — HEPARIN SOD (PORK) LOCK FLUSH 100 UNIT/ML IV SOLN
500.0000 [IU] | Freq: Once | INTRAVENOUS | Status: AC | PRN
  Administered 2020-06-05: 500 [IU]

## 2020-06-05 MED ORDER — FAM-TRASTUZUMAB DERUXTECAN-NXKI CHEMO 100 MG IV SOLR
4.4000 mg/kg | Freq: Once | INTRAVENOUS | Status: AC
  Administered 2020-06-05: 312 mg via INTRAVENOUS
  Filled 2020-06-05: qty 15.6

## 2020-06-05 MED ORDER — SODIUM CHLORIDE 0.9 % IV SOLN
10.0000 mg | Freq: Once | INTRAVENOUS | Status: AC
  Administered 2020-06-05: 10 mg via INTRAVENOUS
  Filled 2020-06-05: qty 10

## 2020-06-05 MED ORDER — PALONOSETRON HCL INJECTION 0.25 MG/5ML
0.2500 mg | Freq: Once | INTRAVENOUS | Status: AC
  Administered 2020-06-05: 0.25 mg via INTRAVENOUS
  Filled 2020-06-05: qty 5

## 2020-06-05 MED ORDER — DEXTROSE 5 % IV SOLN: Freq: Once | INTRAVENOUS | Status: AC

## 2020-06-05 NOTE — Progress Notes (Signed)
[facility] [address] [place] [address]   CLINIC:  Medical Oncology/Hematology  PCP:  [name], MD [address] [name] [address] [phone]   REASON FOR VISIT:  Follow-up for HER-2 positive right breast cancer to the skin.  PRIOR THERAPY:  1. Herceptin and Taxol x 12 cycles from 07/31/2018 to 10/30/2018. 2. Kadcyla from 02/15/2019 to 04/06/2020.  NGS Results: Not done  CURRENT THERAPY: Enhertu  BRIEF ONCOLOGIC HISTORY:  Oncology History  Malignant neoplasm of upper-outer quadrant of right breast in female, estrogen receptor negative ([place])  07/03/2018 Initial Diagnosis   Palpable right breast mass for several months with skin discoloration, clinically skin trabecular thickening with nipple retraction measuring 7.2 x 4.3 x 4.2 cm, additional mass 4 o'clock position 2.6 cm, right axillary tail 1.2 cm right axillary lymph node 1.8 cm left breast irregular mass 1 cm, adjacent mass 0.9 cm together measuring 1.9 cm, no lymphadenopathy   07/03/2018 Pathology Results   Right breast biopsy: IDC grade 2, ER 0%, PR 0%, HER-2 positive, Ki-67 40%, lymph node biopsy negative, left breast biopsy complex sclerosing lesion    07/15/2018 Cancer Staging   Staging form: Breast, AJCC 8th Edition - Clinical: Stage IIB (cT3, cN0, cM0, G2, ER-, PR-, HER2+) - Signed by [name], MD on 07/15/2018   07/31/2018 - 10/30/2018 Chemotherapy   The patient had trastuzumab (HERCEPTIN) 336 mg in sodium chloride 0.9 % 250 mL chemo infusion, 4 mg/kg = 336 mg, Intravenous,  Once, 4 of 4 cycles Administration: 336 mg (07/31/2018), 168 mg (08/07/2018), 168 mg (08/28/2018), 168 mg (08/14/2018), 168 mg (08/21/2018), 168 mg (09/04/2018), 168 mg (09/11/2018), 168 mg (09/18/2018), 168 mg (10/02/2018), 168 mg (10/09/2018), 168 mg (10/16/2018), 168 mg (10/23/2018), 168 mg (10/30/2018) PACLitaxel (TAXOL) 78 mg in sodium chloride 0.9 % 250 mL chemo infusion (</= 79m/m2), 40 mg/m2 = 78 mg  (50 % of original dose 80 mg/m2), Intravenous,  Once, 4 of 4 cycles Dose modification: 40 mg/m2 (50 % of original dose 80 mg/m2, Cycle 1, Reason: Patient Age), 53.3333 mg/m2 (66.7 % of original dose 80 mg/m2, Cycle 1, Reason: Provider Judgment), 45 mg/m2 (original dose 80 mg/m2, Cycle 3, Reason: Provider Judgment) Administration: 78 mg (07/31/2018), 102 mg (08/07/2018), 102 mg (08/28/2018), 102 mg (08/14/2018), 102 mg (08/21/2018), 102 mg (09/04/2018), 102 mg (09/18/2018), 84 mg (10/02/2018), 84 mg (10/09/2018), 84 mg (10/16/2018), 84 mg (10/23/2018), 84 mg (10/30/2018)  for chemotherapy treatment.    11/13/2018 - 02/14/2019 Chemotherapy   The patient had trastuzumab (HERCEPTIN) 504 mg in sodium chloride 0.9 % 250 mL chemo infusion, 6 mg/kg = 504 mg (100 % of original dose 6 mg/kg), Intravenous,  Once, 4 of 5 cycles Dose modification: 6 mg/kg (original dose 6 mg/kg, Cycle 1, Reason: Other (see comments), Comment: pt receving it ) Administration: 504 mg (11/13/2018), 504 mg (12/04/2018), 504 mg (01/04/2019), 504 mg (01/25/2019)  for chemotherapy treatment.    02/15/2019 - 04/06/2020 Chemotherapy   The patient had ado-trastuzumab emtansine (KADCYLA) 240 mg in sodium chloride 0.9 % 250 mL chemo infusion, 3 mg/kg = 240 mg (100 % of original dose 3 mg/kg), Intravenous, Once, 17 of 18 cycles Dose modification: 3 mg/kg (original dose 3 mg/kg, Cycle 1, Reason: Provider Judgment), 3 mg/kg (original dose 3 mg/kg, Cycle 2, Reason: Provider Judgment), 2.4 mg/kg (original dose 3 mg/kg, Cycle 11, Reason: Other (see comments), Comment: bili upper limit of normal), 2.4 mg/kg (original dose 3 mg/kg, Cycle 12, Reason: Other (  see comments), Comment: elevated bilirubin) Administration: 240 mg (02/15/2019), 240 mg (03/08/2019), 240 mg (05/10/2019), 240 mg (03/29/2019), 240 mg (04/19/2019), 240 mg (06/01/2019), 240 mg (06/22/2019), 240 mg (07/20/2019), 240 mg (08/10/2019), 240 mg (09/01/2019), 200 mg (09/22/2019), 180 mg (11/03/2019), 180 mg  (12/09/2019), 180 mg (01/06/2020), 180 mg (02/03/2020), 180 mg (03/09/2020), 180 mg (04/06/2020)  for chemotherapy treatment.    05/15/2020 -  Chemotherapy   The patient had dexamethasone (DECADRON) 4 MG tablet, 8 mg, Oral, Daily, 1 of 1 cycle, Start date: --, End date: -- palonosetron (ALOXI) injection 0.25 mg, 0.25 mg, Intravenous,  Once, 1 of 6 cycles Administration: 0.25 mg (05/15/2020) fam-trastuzumab deruxtecan-nxki (ENHERTU) 226 mg in dextrose 5 % 100 mL chemo infusion, 3.2 mg/kg = 226 mg (59.3 % of original dose 5.4 mg/kg), Intravenous,  Once, 1 of 6 cycles Dose modification: 3.2 mg/kg (original dose 5.4 mg/kg, Cycle 1, Reason: Provider Judgment) Administration: 226 mg (05/15/2020)  for chemotherapy treatment.      CANCER STAGING: Cancer Staging Malignant neoplasm of upper-outer quadrant of right breast in female, estrogen receptor negative ([place]) Staging form: Breast, AJCC 8th Edition - Clinical: Stage IIB (cT3, cN0, cM0, G2, ER-, PR-, HER2+) - Signed by [name], MD on 07/15/2018   INTERVAL HISTORY:  [name], a 86 y.o. female, returns for routine follow-up and consideration for next cycle of chemotherapy. [name] was last seen on 05/15/2020.  Due for cycle #2 of Enhertu today.   Today she is accompanied by her husband. Overall, she tells me she has been feeling pretty well. She tolerated the previous treatment well. She denies having cough, aches or pains, or difficulty breathing. Her appetite is good. She feels like she is improving with this new treatment.   Overall, she feels ready for next cycle of chemo today.    REVIEW OF SYSTEMS:  Review of Systems  Constitutional: Positive for fatigue (moderate). Negative for appetite change.  Respiratory: Negative for cough.   Musculoskeletal: Negative for arthralgias and myalgias.  All other systems reviewed and are negative.   PAST MEDICAL/SURGICAL HISTORY:  Past Medical History:  Diagnosis Date  . Cancer ([facility]) 06/2018     right breast cancer  . GERD (gastroesophageal reflux disease)    occasional   . Hyperlipidemia   . Hypertension    clearance with note Dr Nevada Crane on chart  . Pneumonia    2 years ago/ states occ cough with sinus drainage- no fever  . Thyroid nodule    with biopsy- states following medically   Past Surgical History:  Procedure Laterality Date  . CATARACT EXTRACTION W/PHACO  11/30/2012   Procedure: CATARACT EXTRACTION PHACO AND INTRAOCULAR LENS PLACEMENT (IOC);  Surgeon: Williams Che, MD;  Location: [facility];  Service: Ophthalmology;  Laterality: Left;  CDE:11.49  . CATARACT EXTRACTION W/PHACO Right 03/15/2013   Procedure: CATARACT EXTRACTION PHACO AND INTRAOCULAR LENS PLACEMENT (IOC);  Surgeon: Williams Che, MD;  Location: [facility];  Service: Ophthalmology;  Laterality: Right;  CDE 16.15  . COLONOSCOPY    . EXCISION OF SKIN TAG Right 06/17/2016   Procedure: SHAVE OF SKIN TAG RIGHT BUTTOCK;  Surgeon: Aviva Signs, MD;  Location: [facility];  Service: General;  Laterality: Right;  . JOINT REPLACEMENT     left knee  . MASS EXCISION Left 06/17/2016   Procedure: EXCISION SKIN MALIGNANT LESION LEFT BUTTOCK;  Surgeon: Aviva Signs, MD;  Location: [facility];  Service: General;  Laterality: Left;  . PORTACATH PLACEMENT Right 07/28/2018  Procedure: INSERTION PORT-A-CATH WITH ULTRASOUND;  Surgeon: Rolm Bookbinder, MD;  Location: [facility];  Service: General;  Laterality: Right;  . PUNCH BIOPSY OF SKIN Right 07/28/2018   Procedure: PUNCH BIOPSY OF SKIN RIGHT BREAST;  Surgeon: Rolm Bookbinder, MD;  Location: [facility];  Service: General;  Laterality: Right;  . TOTAL KNEE ARTHROPLASTY  12/16/2011   Procedure: TOTAL KNEE ARTHROPLASTY;  Surgeon: Gearlean Alf, MD;  Location: [facility];  Service: Orthopedics;  Laterality: Right;  . TUBAL LIGATION      SOCIAL HISTORY:  Social History   Socioeconomic History  . Marital status: Married    Spouse name: Not on file  .  Number of children: 2  . Years of education: some business school after HS  . Highest education level: Not on file  Occupational History  . Occupation: Cabin crew  . Occupation: Network engineer   Tobacco Use  . Smoking status: Never Smoker  . Smokeless tobacco: Never Used  Vaping Use  . Vaping Use: Never used  Substance and Sexual Activity  . Alcohol use: No  . Drug use: No  . Sexual activity: Yes    Birth control/protection: None  Other Topics Concern  . Not on file  Social History Narrative   Lives at home with her husband.   4 cups caffeine per day.   Right-handed.   Social Determinants of Health   Financial Resource Strain:   . Difficulty of Paying Living Expenses:   Food Insecurity:   . Worried About Charity fundraiser in the Last Year:   . Arboriculturist in the Last Year:   Transportation Needs:   . Film/video editor (Medical):   Marland Kitchen Lack of Transportation (Non-Medical):   Physical Activity:   . Days of Exercise per Week:   . Minutes of Exercise per Session:   Stress:   . Feeling of Stress :   Social Connections:   . Frequency of Communication with Friends and Family:   . Frequency of Social Gatherings with Friends and Family:   . Attends Religious Services:   . Active Member of Clubs or Organizations:   . Attends Archivist Meetings:   Marland Kitchen Marital Status:   Intimate Partner Violence:   . Fear of Current or Ex-Partner:   . Emotionally Abused:   Marland Kitchen Physically Abused:   . Sexually Abused:     FAMILY HISTORY:  Family History  Problem Relation Age of Onset  . Heart attack Mother   . Bronchitis Father   . Diabetes Sister   . Leukemia Brother   . Lung disease Brother   . Lung disease Sister     CURRENT MEDICATIONS:  Current Outpatient Medications  Medication Sig Dispense Refill  . acetaminophen (TYLENOL) 325 MG tablet Take 650 mg by mouth every 6 (six) hours as needed.     . Ado-Trastuzumab Emtansine (KADCYLA IV) Inject into the vein.    Marland Kitchen  aspirin 325 MG tablet Take 162.5 mg by mouth daily.     . cetirizine (ZYRTEC) 10 MG tablet Take 10 mg by mouth daily.    . hydrocortisone 1 % ointment Apply 1 application topically 2 (two) times daily. 60 g 0  . lidocaine-prilocaine (EMLA) cream Apply to port site one hour prior to appointment and cover with plastic wrap. 30 g 3  . Misc. Devices MISC Please provide lymphedema sleeve for right arm 2 each 0  . Multiple Vitamin (MULTIVITAMIN WITH MINERALS) TABS tablet Take  1 tablet by mouth daily.    Glory Rosebush ULTRA test strip USE 1 STRIP TO CHECK GLUCOSE 2 TO 3 TIMES DAILY     No current facility-administered medications for this visit.   Facility-Administered Medications Ordered in Other Visits  Medication Dose Route Frequency Provider Last Rate Last Admin  . sodium chloride flush (NS) 0.9 % injection 10 mL  10 mL Intravenous PRN Derek Jack, MD   10 mL at 05/23/20 1025  . sodium chloride flush (NS) 0.9 % injection 10 mL  10 mL Intravenous PRN Derek Jack, MD   10 mL at 05/23/20 0900    ALLERGIES:  Allergies  Allergen Reactions  . Penicillins Rash    Has patient had a PCN reaction causing immediate rash, facial/tongue/throat swelling, SOB or lightheadedness with hypotension: No Has patient had a PCN reaction causing severe rash involving mucus membranes or skin necrosis: No Has patient had a PCN reaction that required hospitalization: No Has patient had a PCN reaction occurring within the last 10 years: No If all of the above answers are "NO", then may proceed with Cephalosporin use.     PHYSICAL EXAM:  Performance status (ECOG): 1 - Symptomatic but completely ambulatory  Vitals:   06/05/20 0921  BP: 134/64  Pulse: 98  Resp: 18  Temp: (!) 97.3 F (36.3 C)  SpO2: 98%   Wt Readings from Last 3 Encounters:  06/05/20 163 lb 9.6 oz (74.2 kg)  05/23/20 159 lb 1.6 oz (72.2 kg)  05/15/20 156 lb 11.2 oz (71.1 kg)   Physical Exam Vitals reviewed.   Constitutional:      Appearance: Normal appearance.  Cardiovascular:     Rate and Rhythm: Normal rate and regular rhythm.     Pulses: Normal pulses.     Heart sounds: Normal heart sounds.  Pulmonary:     Effort: Pulmonary effort is normal.     Breath sounds: Normal breath sounds.  Chest:     Comments: Port-a-Cath on R chest Abdominal:     Palpations: Abdomen is soft. There is no mass.     Tenderness: There is no abdominal tenderness.  Musculoskeletal:     Right lower leg: No edema.     Left lower leg: No edema.  Neurological:     General: No focal deficit present.     Mental Status: She is alert and oriented to person, place, and time.  Psychiatric:        Mood and Affect: Mood normal.        Behavior: Behavior normal.     LABORATORY DATA:  I have reviewed the labs as listed.  CBC Latest Ref Rng & Units 06/05/2020 05/23/2020 05/15/2020  WBC 4.0 - 10.5 K/uL 6.0 10.2 8.0  Hemoglobin 12.0 - 15.0 g/dL 8.0(L) 8.3(L) 9.7(L)  Hematocrit 36 - 46 % 24.8(L) 25.1(L) 29.8(L)  Platelets 150 - 400 K/uL 184 250 198   CMP Latest Ref Rng & Units 06/05/2020 05/23/2020 05/15/2020  Glucose 70 - 99 mg/dL 90 95 100(H)  BUN 8 - 23 mg/dL 24(H) 31(H) 28(H)  Creatinine 0.44 - 1.00 mg/dL 1.11(H) 1.26(H) 1.26(H)  Sodium 135 - 145 mmol/L 136 135 139  Potassium 3.5 - 5.1 mmol/L 3.5 3.5 3.6  Chloride 98 - 111 mmol/L 107 106 107  CO2 22 - 32 mmol/L 19(L) 19(L) 20(L)  Calcium 8.9 - 10.3 mg/dL 8.4(L) 8.9 9.4  Total Protein 6.5 - 8.1 g/dL 6.2(L) 6.2(L) 6.9  Total Bilirubin 0.3 - 1.2 mg/dL 1.1 1.1 1.0  Alkaline Phos 38 - 126 U/L 112 121 138(H)  AST 15 - 41 U/L 44(H) 48(H) 68(H)  ALT 0 - 44 U/L 21 25 28    Lab Results  Component Value Date   LDH 184 06/05/2020   LDH 193 (H) 12/09/2019   LDH 194 (H) 11/24/2019   Lab Results  Component Value Date   TIBC 301 08/21/2018   FERRITIN 185 08/21/2018   FERRITIN 195 (H) 11/24/2017   IRONPCTSAT 13 08/21/2018    DIAGNOSTIC IMAGING:  I have independently  reviewed the scans and discussed with the patient. No results found.   ASSESSMENT:  1. Metastatic right breast cancer to the skin, ER/PR negative, HER-2 positive: -Presentation with skin involvement of the right breast, axillary region and posterior chest wall. -12 cycles of weekly Taxol and Herceptin from 07/31/2018 through 10/30/2018 with PET scan showing progression. -Kadcyla from 02/15/2019 through 04/06/2020 with progression. -CT CAP on 04/26/2020 shows right breast mass measuring 3.7 x 2.8 (2.7 x 2.3). CC dimension measures 4.3 cm (3.9 cm). No new areas of metastatic disease. No skeletal metastasis. -Enhertu started on 05/15/2020 at 3.4 mg/kg.  2.  High risk drug monitoring: -2D echo on 04/03/2020 shows EF 65 to 70%.   PLAN:  1. Metastatic right breast cancer to the skin, ER/PR negative, HER-2 positive: -She has tolerated first cycle of Enhertu very well.  No issues with her breathing. -I have reviewed her labs today.  White count and platelets are normal.  However hemoglobin dropped to 8.  We will check for anemia panel today.  If ferritin is low, she will benefit from [facility]. -She will proceed with cycle 2 of Enhertu.  I plan to repeat scans after 3-4 cycles.  2. High risk drug monitoring: -We will continue monitoring echocardiogram once every 3 to 4 months.  No symptoms of PND or orthopnea.  3. Hypokalemia: -Continue K-Dur 20 mEq daily.  Potassium today 3.5.  4. Skin rash: -Continue steroid cream on lower back for itching.  5. Elevated LFTs: -AST improved to 44 today.  Total bilirubin is normal.  6. CKD: -Creatinine today is improved to 1.11.  Continue to avoid nephrotoxins.   Orders placed this encounter:  No orders of the defined types were placed in this encounter.    Derek Jack, MD [facility] [phone]   I, Milinda Antis, am acting as a scribe for Dr. Sanda Linger.  I, Derek Jack MD, have reviewed the  above documentation for accuracy and completeness, and I agree with the above.

## 2020-06-05 NOTE — Patient Instructions (Signed)
[facility] Discharge Instructions  You were seen today by Dr. Katragadda. He went over your recent results. You received your treatment today. Dr. Katragadda will see you back in 3 weeks for labs and follow up.   Thank you for choosing [facility] at [facility] to provide your oncology and hematology care.  To afford each patient quality time with our provider, please arrive at least 15 minutes before your scheduled appointment time.   If you have a lab appointment with the Cancer Center please come in thru the Main Entrance and check in at the main information desk  You need to re-schedule your appointment should you arrive 10 or more minutes late.  We strive to give you quality time with our providers, and arriving late affects you and other patients whose appointments are after yours.  Also, if you no show three or more times for appointments you may be dismissed from the clinic at the providers discretion.     Again, thank you for choosing [facility].  Our hope is that these requests will decrease the amount of time that you wait before being seen by our physicians.       _____________________________________________________________  Should you have questions after your visit to [facility], please contact our office at [phone] between the hours of 8:00 a.m. and 4:30 p.m.  Voicemails left after 4:00 p.m. will not be returned until the following business day.  For prescription refill requests, have your pharmacy contact our office and allow 72 hours.    Cancer Center Support Programs:   > Cancer Support Group  2nd Tuesday of the month 1pm-2pm, Journey Room    

## 2020-06-05 NOTE — Patient Instructions (Signed)
[facility] Discharge Instructions for Patients Receiving Chemotherapy   Beginning January 23rd 2017 lab work for the Cancer Center will be done in the  Main lab at [facility] on 1st floor. If you have a lab appointment with the Cancer Center please come in thru the  Main Entrance and check in at the main information desk   Today you received the following chemotherapy agents Enhertu. Follow-up as scheduled  To help prevent nausea and vomiting after your treatment, we encourage you to take your nausea medication   If you develop nausea and vomiting, or diarrhea that is not controlled by your medication, call the clinic.  The clinic phone number is [phone]. Office hours are Monday-Friday 8:30am-5:00pm.  BELOW ARE SYMPTOMS THAT SHOULD BE REPORTED IMMEDIATELY:  *FEVER GREATER THAN 101.0 F  *CHILLS WITH OR WITHOUT FEVER  NAUSEA AND VOMITING THAT IS NOT CONTROLLED WITH YOUR NAUSEA MEDICATION  *UNUSUAL SHORTNESS OF BREATH  *UNUSUAL BRUISING OR BLEEDING  TENDERNESS IN MOUTH AND THROAT WITH OR WITHOUT PRESENCE OF ULCERS  *URINARY PROBLEMS  *BOWEL PROBLEMS  UNUSUAL RASH Items with * indicate a potential emergency and should be followed up as soon as possible. If you have an emergency after office hours please contact your primary care physician or go to the nearest emergency department.  Please call the clinic during office hours if you have any questions or concerns.   You may also contact the Patient Navigator at [phone] should you have any questions or need assistance in obtaining follow up care.      Resources For Cancer Patients and their Caregivers ? American Cancer Society: Can assist with transportation, wigs, general needs, runs Look Good Feel Better.        1-888-227-6333 ? Cancer Care: Provides financial assistance, online support groups, medication/co-pay assistance.  1-800-813-HOPE (4673) ? [facility] Assists [place] cancer patients and their families through emotional , educational and financial support.  336-427-4357 ? [place] DSS Where to apply for food stamps, Medicaid and utility assistance. 336-342-1394 ? [facility]: Transportation to medical appointments. 336-347-2287 ? Social Security Administration: May apply for disability if have a Stage IV cancer. 336-342-7796 1-800-772-1213 ? [place] Aging, Disability and Transit Services: Assists with nutrition, care and transit needs. 336-349-2343         

## 2020-06-05 NOTE — Progress Notes (Signed)
[place] reviewed with and pt seen by Dr. [name] and pt approved for Enhertu infusion today per MD                                   [name] tolerated Enhertu infusion well without complaints or incident. VSS upon discharge. Pt discharged self ambulatory with assistance from her husband in satisfactory condition

## 2020-06-05 NOTE — Progress Notes (Signed)
Patient has been assessed by Dr. [name], labs have been reviewed. He wants to add on iron / TIBC, Ferritin, Folate and Vit B-12 to assess her decrease in Hgb.  He is okay to proceed with treatment today regardless of these results.  He will review labs at her next appointment.

## 2020-06-06 ENCOUNTER — Other Ambulatory Visit (HOSPITAL_COMMUNITY): Payer: Self-pay | Admitting: *Deleted

## 2020-06-06 DIAGNOSIS — Z171 Estrogen receptor negative status [ER-]: Secondary | ICD-10-CM

## 2020-06-06 DIAGNOSIS — C50411 Malignant neoplasm of upper-outer quadrant of right female breast: Secondary | ICD-10-CM

## 2020-06-09 DIAGNOSIS — K219 Gastro-esophageal reflux disease without esophagitis: Secondary | ICD-10-CM | POA: Diagnosis not present

## 2020-06-09 DIAGNOSIS — I10 Essential (primary) hypertension: Secondary | ICD-10-CM | POA: Diagnosis not present

## 2020-06-09 DIAGNOSIS — E782 Mixed hyperlipidemia: Secondary | ICD-10-CM | POA: Diagnosis not present

## 2020-06-09 DIAGNOSIS — E1121 Type 2 diabetes mellitus with diabetic nephropathy: Secondary | ICD-10-CM | POA: Diagnosis not present

## 2020-06-26 ENCOUNTER — Other Ambulatory Visit: Payer: Self-pay

## 2020-06-26 ENCOUNTER — Inpatient Hospital Stay (HOSPITAL_COMMUNITY): Payer: Medicare HMO

## 2020-06-26 ENCOUNTER — Encounter (HOSPITAL_COMMUNITY): Payer: Self-pay

## 2020-06-26 ENCOUNTER — Inpatient Hospital Stay (HOSPITAL_BASED_OUTPATIENT_CLINIC_OR_DEPARTMENT_OTHER): Payer: Medicare HMO | Admitting: Hematology

## 2020-06-26 VITALS — BP 135/80 | HR 86 | Temp 97.2°F | Resp 18

## 2020-06-26 VITALS — Wt 164.7 lb

## 2020-06-26 DIAGNOSIS — Z171 Estrogen receptor negative status [ER-]: Secondary | ICD-10-CM

## 2020-06-26 DIAGNOSIS — Z5112 Encounter for antineoplastic immunotherapy: Secondary | ICD-10-CM | POA: Diagnosis not present

## 2020-06-26 DIAGNOSIS — C50411 Malignant neoplasm of upper-outer quadrant of right female breast: Secondary | ICD-10-CM | POA: Diagnosis not present

## 2020-06-26 LAB — COMPREHENSIVE METABOLIC PANEL
ALT: 24 U/L (ref 0–44)
AST: 52 U/L — ABNORMAL HIGH (ref 15–41)
Albumin: 2.7 g/dL — ABNORMAL LOW (ref 3.5–5.0)
Alkaline Phosphatase: 120 U/L (ref 38–126)
Anion gap: 10 (ref 5–15)
BUN: 26 mg/dL — ABNORMAL HIGH (ref 8–23)
CO2: 19 mmol/L — ABNORMAL LOW (ref 22–32)
Calcium: 9.4 mg/dL (ref 8.9–10.3)
Chloride: 109 mmol/L (ref 98–111)
Creatinine, Ser: 1.19 mg/dL — ABNORMAL HIGH (ref 0.44–1.00)
GFR calc Af Amer: 47 mL/min — ABNORMAL LOW (ref >60)
GFR calc non Af Amer: 41 mL/min — ABNORMAL LOW (ref >60)
Glucose, Bld: 122 mg/dL — ABNORMAL HIGH (ref 70–99)
Potassium: 4 mmol/L (ref 3.5–5.1)
Sodium: 138 mmol/L (ref 135–145)
Total Bilirubin: 1.1 mg/dL (ref 0.3–1.2)
Total Protein: 5.9 g/dL — ABNORMAL LOW (ref 6.5–8.1)

## 2020-06-26 LAB — CBC WITH DIFFERENTIAL/PLATELET
Abs Immature Granulocytes: 0.02 10*3/uL (ref 0.00–0.07)
Basophils Absolute: 0.1 10*3/uL (ref 0.0–0.1)
Basophils Relative: 1 %
Eosinophils Absolute: 0.6 10*3/uL — ABNORMAL HIGH (ref 0.0–0.5)
Eosinophils Relative: 8 %
HCT: 23.9 % — ABNORMAL LOW (ref 36.0–46.0)
Hemoglobin: 7.8 g/dL — ABNORMAL LOW (ref 12.0–15.0)
Immature Granulocytes: 0 %
Lymphocytes Relative: 32 %
Lymphs Abs: 2.3 10*3/uL (ref 0.7–4.0)
MCH: 31.6 pg (ref 26.0–34.0)
MCHC: 32.6 g/dL (ref 30.0–36.0)
MCV: 96.8 fL (ref 80.0–100.0)
Monocytes Absolute: 1.2 10*3/uL — ABNORMAL HIGH (ref 0.1–1.0)
Monocytes Relative: 16 %
Neutro Abs: 3.2 10*3/uL (ref 1.7–7.7)
Neutrophils Relative %: 43 %
Platelets: 210 10*3/uL (ref 150–400)
RBC: 2.47 MIL/uL — ABNORMAL LOW (ref 3.87–5.11)
RDW: 23.9 % — ABNORMAL HIGH (ref 11.5–15.5)
WBC: 7.4 10*3/uL (ref 4.0–10.5)
nRBC: 0 % (ref 0.0–0.2)

## 2020-06-26 LAB — MAGNESIUM: Magnesium: 1.8 mg/dL (ref 1.7–2.4)

## 2020-06-26 MED ORDER — FAM-TRASTUZUMAB DERUXTECAN-NXKI CHEMO 100 MG IV SOLR
4.4000 mg/kg | Freq: Once | INTRAVENOUS | Status: AC
  Administered 2020-06-26: 312 mg via INTRAVENOUS
  Filled 2020-06-26: qty 15.6

## 2020-06-26 MED ORDER — SODIUM CHLORIDE 0.9% FLUSH
10.0000 mL | INTRAVENOUS | Status: DC | PRN
  Administered 2020-06-26: 10 mL

## 2020-06-26 MED ORDER — HEPARIN SOD (PORK) LOCK FLUSH 100 UNIT/ML IV SOLN
500.0000 [IU] | Freq: Once | INTRAVENOUS | Status: AC | PRN
  Administered 2020-06-26: 500 [IU]

## 2020-06-26 MED ORDER — SODIUM CHLORIDE 0.9 % IV SOLN: Freq: Once | INTRAVENOUS | Status: AC

## 2020-06-26 MED ORDER — DIPHENHYDRAMINE HCL 25 MG PO CAPS
50.0000 mg | ORAL_CAPSULE | Freq: Once | ORAL | Status: AC
  Administered 2020-06-26: 50 mg via ORAL
  Filled 2020-06-26: qty 2

## 2020-06-26 MED ORDER — PALONOSETRON HCL INJECTION 0.25 MG/5ML
0.2500 mg | Freq: Once | INTRAVENOUS | Status: AC
  Administered 2020-06-26: 0.25 mg via INTRAVENOUS
  Filled 2020-06-26: qty 5

## 2020-06-26 MED ORDER — SODIUM CHLORIDE 0.9 % IV SOLN
200.0000 mg | Freq: Once | INTRAVENOUS | Status: AC
  Administered 2020-06-26: 200 mg via INTRAVENOUS
  Filled 2020-06-26: qty 10

## 2020-06-26 MED ORDER — SODIUM CHLORIDE 0.9 % IV SOLN
10.0000 mg | Freq: Once | INTRAVENOUS | Status: AC
  Administered 2020-06-26: 10 mg via INTRAVENOUS
  Filled 2020-06-26: qty 10

## 2020-06-26 MED ORDER — ACETAMINOPHEN 325 MG PO TABS
650.0000 mg | ORAL_TABLET | Freq: Once | ORAL | Status: AC
  Administered 2020-06-26: 650 mg via ORAL
  Filled 2020-06-26: qty 2

## 2020-06-26 MED ORDER — DEXTROSE 5 % IV SOLN: Freq: Once | INTRAVENOUS | Status: AC

## 2020-06-26 MED ORDER — FAMOTIDINE 20 MG PO TABS
20.0000 mg | ORAL_TABLET | Freq: Once | ORAL | Status: AC
  Administered 2020-06-26: 20 mg via ORAL
  Filled 2020-06-26: qty 1

## 2020-06-26 NOTE — Patient Instructions (Signed)
[facility] Discharge Instructions for Patients Receiving Chemotherapy  Today you received the following chemotherapy agents   To help prevent nausea and vomiting after your treatment, we encourage you to take your nausea medication   If you develop nausea and vomiting that is not controlled by your nausea medication, call the clinic.   BELOW ARE SYMPTOMS THAT SHOULD BE REPORTED IMMEDIATELY:  *FEVER GREATER THAN 100.5 F  *CHILLS WITH OR WITHOUT FEVER  NAUSEA AND VOMITING THAT IS NOT CONTROLLED WITH YOUR NAUSEA MEDICATION  *UNUSUAL SHORTNESS OF BREATH  *UNUSUAL BRUISING OR BLEEDING  TENDERNESS IN MOUTH AND THROAT WITH OR WITHOUT PRESENCE OF ULCERS  *URINARY PROBLEMS  *BOWEL PROBLEMS  UNUSUAL RASH Items with * indicate a potential emergency and should be followed up as soon as possible.  Feel free to call the clinic should you have any questions or concerns. The clinic phone number is [phone].  Please show the CHEMO ALERT CARD at check-in to the Emergency Department and triage nurse.   

## 2020-06-26 NOTE — Progress Notes (Signed)
Patient was assessed by Dr. [name] and labs have been reviewed.  Patient's liver function tests slightly elevated, Dr. [name] will monitor.  Patient's iron was low and hemoglobin has dropped.  Dr. [name] wants patient to get feraheme today along with her treatment and then feraheme again next week.  Patient is okay to proceed with treatment today. Primary RN and pharmacy aware.

## 2020-06-26 NOTE — Progress Notes (Signed)
[facility] [address] [place] [address]   CLINIC:  Medical Oncology/Hematology  PCP:  [name], MD [address] [name] [place] [address] [phone]   REASON FOR VISIT:  Follow-up for HER-2 positive right breast cancer to skin  PRIOR THERAPY:  1. Herceptin and Taxol x 12 cycles from 07/31/2018 to 10/30/2018. 2. Kadcyla x 17 cycles from 02/15/2019 to 04/06/2020.  NGS Results: Not done  CURRENT THERAPY: Enhertu every 3 weeks  BRIEF ONCOLOGIC HISTORY:  Oncology History  Malignant neoplasm of upper-outer quadrant of right breast in female, estrogen receptor negative ([place])  07/03/2018 Initial Diagnosis   Palpable right breast mass for several months with skin discoloration, clinically skin trabecular thickening with nipple retraction measuring 7.2 x 4.3 x 4.2 cm, additional mass 4 o'clock position 2.6 cm, right axillary tail 1.2 cm right axillary lymph node 1.8 cm left breast irregular mass 1 cm, adjacent mass 0.9 cm together measuring 1.9 cm, no lymphadenopathy   07/03/2018 Pathology Results   Right breast biopsy: IDC grade 2, ER 0%, PR 0%, HER-2 positive, Ki-67 40%, lymph node biopsy negative, left breast biopsy complex sclerosing lesion    07/15/2018 Cancer Staging   Staging form: Breast, AJCC 8th Edition - Clinical: Stage IIB (cT3, cN0, cM0, G2, ER-, PR-, HER2+) - Signed by [name], MD on 07/15/2018   07/31/2018 - 10/30/2018 Chemotherapy   The patient had trastuzumab (HERCEPTIN) 336 mg in sodium chloride 0.9 % 250 mL chemo infusion, 4 mg/kg = 336 mg, Intravenous,  Once, 4 of 4 cycles Administration: 336 mg (07/31/2018), 168 mg (08/07/2018), 168 mg (08/28/2018), 168 mg (08/14/2018), 168 mg (08/21/2018), 168 mg (09/04/2018), 168 mg (09/11/2018), 168 mg (09/18/2018), 168 mg (10/02/2018), 168 mg (10/09/2018), 168 mg (10/16/2018), 168 mg (10/23/2018), 168 mg (10/30/2018) PACLitaxel (TAXOL) 78 mg in sodium chloride 0.9 % 250 mL chemo infusion (</=  2m/m2), 40 mg/m2 = 78 mg (50 % of original dose 80 mg/m2), Intravenous,  Once, 4 of 4 cycles Dose modification: 40 mg/m2 (50 % of original dose 80 mg/m2, Cycle 1, Reason: Patient Age), 53.3333 mg/m2 (66.7 % of original dose 80 mg/m2, Cycle 1, Reason: Provider Judgment), 45 mg/m2 (original dose 80 mg/m2, Cycle 3, Reason: Provider Judgment) Administration: 78 mg (07/31/2018), 102 mg (08/07/2018), 102 mg (08/28/2018), 102 mg (08/14/2018), 102 mg (08/21/2018), 102 mg (09/04/2018), 102 mg (09/18/2018), 84 mg (10/02/2018), 84 mg (10/09/2018), 84 mg (10/16/2018), 84 mg (10/23/2018), 84 mg (10/30/2018)  for chemotherapy treatment.    11/13/2018 - 02/14/2019 Chemotherapy   The patient had trastuzumab (HERCEPTIN) 504 mg in sodium chloride 0.9 % 250 mL chemo infusion, 6 mg/kg = 504 mg (100 % of original dose 6 mg/kg), Intravenous,  Once, 4 of 5 cycles Dose modification: 6 mg/kg (original dose 6 mg/kg, Cycle 1, Reason: Other (see comments), Comment: pt receving it ) Administration: 504 mg (11/13/2018), 504 mg (12/04/2018), 504 mg (01/04/2019), 504 mg (01/25/2019)  for chemotherapy treatment.    02/15/2019 - 04/06/2020 Chemotherapy   The patient had ado-trastuzumab emtansine (KADCYLA) 240 mg in sodium chloride 0.9 % 250 mL chemo infusion, 3 mg/kg = 240 mg (100 % of original dose 3 mg/kg), Intravenous, Once, 17 of 18 cycles Dose modification: 3 mg/kg (original dose 3 mg/kg, Cycle 1, Reason: Provider Judgment), 3 mg/kg (original dose 3 mg/kg, Cycle 2, Reason: Provider Judgment), 2.4 mg/kg (original dose 3 mg/kg, Cycle 11, Reason: Other (see comments), Comment: bili upper limit of normal), 2.4 mg/kg (original dose 3  mg/kg, Cycle 12, Reason: Other (see comments), Comment: elevated bilirubin) Administration: 240 mg (02/15/2019), 240 mg (03/08/2019), 240 mg (05/10/2019), 240 mg (03/29/2019), 240 mg (04/19/2019), 240 mg (06/01/2019), 240 mg (06/22/2019), 240 mg (07/20/2019), 240 mg (08/10/2019), 240 mg (09/01/2019), 200 mg (09/22/2019), 180 mg  (11/03/2019), 180 mg (12/09/2019), 180 mg (01/06/2020), 180 mg (02/03/2020), 180 mg (03/09/2020), 180 mg (04/06/2020)  for chemotherapy treatment.    05/15/2020 -  Chemotherapy   The patient had dexamethasone (DECADRON) 4 MG tablet, 8 mg, Oral, Daily, 1 of 1 cycle, Start date: --, End date: -- palonosetron (ALOXI) injection 0.25 mg, 0.25 mg, Intravenous,  Once, 2 of 6 cycles Administration: 0.25 mg (05/15/2020), 0.25 mg (06/05/2020) fam-trastuzumab deruxtecan-nxki (ENHERTU) 226 mg in dextrose 5 % 100 mL chemo infusion, 3.2 mg/kg = 226 mg (59.3 % of original dose 5.4 mg/kg), Intravenous,  Once, 2 of 6 cycles Dose modification: 3.2 mg/kg (original dose 5.4 mg/kg, Cycle 1, Reason: Provider Judgment), 4.4 mg/kg (original dose 5.4 mg/kg, Cycle 2, Reason: Provider Judgment), 4.4 mg/kg (original dose 5.4 mg/kg, Cycle 3, Reason: Provider Judgment) Administration: 226 mg (05/15/2020), 312 mg (06/05/2020)  for chemotherapy treatment.      CANCER STAGING: Cancer Staging Malignant neoplasm of upper-outer quadrant of right breast in female, estrogen receptor negative ([place]) Staging form: Breast, AJCC 8th Edition - Clinical: Stage IIB (cT3, cN0, cM0, G2, ER-, PR-, HER2+) - Signed by [name], MD on 07/15/2018   INTERVAL HISTORY:  [name], a 86 y.o. female, returns for routine follow-up and consideration for next cycle of chemotherapy. [name] was last seen on 06/05/2020.  Due for cycle #3 of Enhertu today.   Today she is accompanied by her husband. Overall, she tells me she has been feeling pretty well. She tolerated the previous treatment well and denies having increased weakness or dyspnea or SOB. She reports having itching on the top of her back and uses a brush to scratch, which sometimes bleeds. She is currently taking iron tablets daily. She is not taking potassium daily.  Overall, she feels ready for next cycle of chemo today.    REVIEW OF SYSTEMS:  Review of Systems  Constitutional: Positive for  fatigue (moderate). Negative for appetite change.  Respiratory: Negative for shortness of breath.   Cardiovascular: Positive for leg swelling.  Musculoskeletal: Positive for arthralgias (leg aches).  All other systems reviewed and are negative.   PAST MEDICAL/SURGICAL HISTORY:  Past Medical History:  Diagnosis Date  . Cancer ([place]) 06/2018   right breast cancer  . GERD (gastroesophageal reflux disease)    occasional   . Hyperlipidemia   . Hypertension    clearance with note Dr Nevada Crane on chart  . Pneumonia    2 years ago/ states occ cough with sinus drainage- no fever  . Thyroid nodule    with biopsy- states following medically   Past Surgical History:  Procedure Laterality Date  . CATARACT EXTRACTION W/PHACO  11/30/2012   Procedure: CATARACT EXTRACTION PHACO AND INTRAOCULAR LENS PLACEMENT (IOC);  Surgeon: Williams Che, MD;  Location: [facility];  Service: Ophthalmology;  Laterality: Left;  CDE:11.49  . CATARACT EXTRACTION W/PHACO Right 03/15/2013   Procedure: CATARACT EXTRACTION PHACO AND INTRAOCULAR LENS PLACEMENT (IOC);  Surgeon: Williams Che, MD;  Location: [facility];  Service: Ophthalmology;  Laterality: Right;  CDE 16.15  . COLONOSCOPY    . EXCISION OF SKIN TAG Right 06/17/2016   Procedure: SHAVE OF SKIN TAG RIGHT BUTTOCK;  Surgeon: Aviva Signs, MD;  Location:  [facility];  Service: General;  Laterality: Right;  . JOINT REPLACEMENT     left knee  . MASS EXCISION Left 06/17/2016   Procedure: EXCISION SKIN MALIGNANT LESION LEFT BUTTOCK;  Surgeon: Aviva Signs, MD;  Location: [facility];  Service: General;  Laterality: Left;  . PORTACATH PLACEMENT Right 07/28/2018   Procedure: INSERTION PORT-A-CATH WITH ULTRASOUND;  Surgeon: Rolm Bookbinder, MD;  Location: [facility];  Service: General;  Laterality: Right;  . PUNCH BIOPSY OF SKIN Right 07/28/2018   Procedure: PUNCH BIOPSY OF SKIN RIGHT BREAST;  Surgeon: Rolm Bookbinder, MD;  Location: [facility];  Service:  General;  Laterality: Right;  . TOTAL KNEE ARTHROPLASTY  12/16/2011   Procedure: TOTAL KNEE ARTHROPLASTY;  Surgeon: Gearlean Alf, MD;  Location: [facility];  Service: Orthopedics;  Laterality: Right;  . TUBAL LIGATION      SOCIAL HISTORY:  Social History   Socioeconomic History  . Marital status: Married    Spouse name: Not on file  . Number of children: 2  . Years of education: some business school after HS  . Highest education level: Not on file  Occupational History  . Occupation: Cabin crew  . Occupation: Network engineer   Tobacco Use  . Smoking status: Never Smoker  . Smokeless tobacco: Never Used  Vaping Use  . Vaping Use: Never used  Substance and Sexual Activity  . Alcohol use: No  . Drug use: No  . Sexual activity: Yes    Birth control/protection: None  Other Topics Concern  . Not on file  Social History Narrative   Lives at home with her husband.   4 cups caffeine per day.   Right-handed.   Social Determinants of Health   Financial Resource Strain:   . Difficulty of Paying Living Expenses: Not on file  Food Insecurity:   . Worried About Charity fundraiser in the Last Year: Not on file  . Ran Out of Food in the Last Year: Not on file  Transportation Needs:   . Lack of Transportation (Medical): Not on file  . Lack of Transportation (Non-Medical): Not on file  Physical Activity:   . Days of Exercise per Week: Not on file  . Minutes of Exercise per Session: Not on file  Stress:   . Feeling of Stress : Not on file  Social Connections:   . Frequency of Communication with Friends and Family: Not on file  . Frequency of Social Gatherings with Friends and Family: Not on file  . Attends Religious Services: Not on file  . Active Member of Clubs or Organizations: Not on file  . Attends Archivist Meetings: Not on file  . Marital Status: Not on file  Intimate Partner Violence:   . Fear of Current or Ex-Partner: Not on file  . Emotionally Abused: Not on file  .  Physically Abused: Not on file  . Sexually Abused: Not on file    FAMILY HISTORY:  Family History  Problem Relation Age of Onset  . Heart attack Mother   . Bronchitis Father   . Diabetes Sister   . Leukemia Brother   . Lung disease Brother   . Lung disease Sister     CURRENT MEDICATIONS:  Current Outpatient Medications  Medication Sig Dispense Refill  . acetaminophen (TYLENOL) 325 MG tablet Take 650 mg by mouth every 6 (six) hours as needed.     Marland Kitchen aspirin 325 MG tablet Take 162.5 mg by mouth daily.     Marland Kitchen  cetirizine (ZYRTEC) 10 MG tablet Take 10 mg by mouth daily.    . hydrocortisone 1 % ointment Apply 1 application topically 2 (two) times daily. 60 g 0  . lidocaine-prilocaine (EMLA) cream Apply to port site one hour prior to appointment and cover with plastic wrap. 30 g 3  . Misc. Devices MISC Please provide lymphedema sleeve for right arm 2 each 0  . Multiple Vitamin (MULTIVITAMIN WITH MINERALS) TABS tablet Take 1 tablet by mouth daily.    Glory Rosebush ULTRA test strip USE 1 STRIP TO CHECK GLUCOSE 2 TO 3 TIMES DAILY     No current facility-administered medications for this visit.   Facility-Administered Medications Ordered in Other Visits  Medication Dose Route Frequency Provider Last Rate Last Admin  . sodium chloride flush (NS) 0.9 % injection 10 mL  10 mL Intravenous PRN Derek Jack, MD   10 mL at 05/23/20 1025  . sodium chloride flush (NS) 0.9 % injection 10 mL  10 mL Intravenous PRN Derek Jack, MD   10 mL at 05/23/20 0900    ALLERGIES:  Allergies  Allergen Reactions  . Penicillins Rash    Has patient had a PCN reaction causing immediate rash, facial/tongue/throat swelling, SOB or lightheadedness with hypotension: No Has patient had a PCN reaction causing severe rash involving mucus membranes or skin necrosis: No Has patient had a PCN reaction that required hospitalization: No Has patient had a PCN reaction occurring within the last 10 years: No If all  of the above answers are "NO", then may proceed with Cephalosporin use.     PHYSICAL EXAM:  Performance status (ECOG): 1 - Symptomatic but completely ambulatory  There were no vitals filed for this visit. Wt Readings from Last 3 Encounters:  06/26/20 164 lb 11.2 oz (74.7 kg)  06/05/20 163 lb 9.6 oz (74.2 kg)  05/23/20 159 lb 1.6 oz (72.2 kg)   Physical Exam Vitals reviewed.  Constitutional:      Appearance: Normal appearance.  Cardiovascular:     Rate and Rhythm: Normal rate and regular rhythm.     Pulses: Normal pulses.     Heart sounds: Murmur (systolic ejection murmur) heard.   Pulmonary:     Effort: Pulmonary effort is normal.     Breath sounds: Normal breath sounds.  Chest:     Breasts:        Right: Mass (decreasing) present. No swelling, inverted nipple or tenderness.      Comments: Port-a-Cath in R chest Musculoskeletal:     Right lower leg: Edema (trace) present.     Left lower leg: Edema (trace) present.  Neurological:     General: No focal deficit present.     Mental Status: She is alert and oriented to person, place, and time.  Psychiatric:        Mood and Affect: Mood normal.        Behavior: Behavior normal.     LABORATORY DATA:  I have reviewed the labs as listed.  CBC Latest Ref Rng & Units 06/26/2020 06/05/2020 05/23/2020  WBC 4.0 - 10.5 K/uL 7.4 6.0 10.2  Hemoglobin 12.0 - 15.0 g/dL 7.8(L) 8.0(L) 8.3(L)  Hematocrit 36 - 46 % 23.9(L) 24.8(L) 25.1(L)  Platelets 150 - 400 K/uL 210 184 250   CMP Latest Ref Rng & Units 06/26/2020 06/05/2020 05/23/2020  Glucose 70 - 99 mg/dL 122(H) 90 95  BUN 8 - 23 mg/dL 26(H) 24(H) 31(H)  Creatinine 0.44 - 1.00 mg/dL 1.19(H) 1.11(H) 1.26(H)  Sodium 135 -  145 mmol/L 138 136 135  Potassium 3.5 - 5.1 mmol/L 4.0 3.5 3.5  Chloride 98 - 111 mmol/L 109 107 106  CO2 22 - 32 mmol/L 19(L) 19(L) 19(L)  Calcium 8.9 - 10.3 mg/dL 9.4 8.4(L) 8.9  Total Protein 6.5 - 8.1 g/dL 5.9(L) 6.2(L) 6.2(L)  Total Bilirubin 0.3 - 1.2 mg/dL 1.1  1.1 1.1  Alkaline Phos 38 - 126 U/L 120 112 121  AST 15 - 41 U/L 52(H) 44(H) 48(H)  ALT 0 - 44 U/L 24 21 25    Lab Results  Component Value Date   LDH 184 06/05/2020   LDH 193 (H) 12/09/2019   LDH 194 (H) 11/24/2019   Lab Results  Component Value Date   TIBC 467 (H) 06/05/2020   TIBC 301 08/21/2018   FERRITIN 21 06/05/2020   FERRITIN 185 08/21/2018   FERRITIN 195 (H) 11/24/2017   IRONPCTSAT 8 (L) 06/05/2020   IRONPCTSAT 13 08/21/2018    DIAGNOSTIC IMAGING:  I have independently reviewed the scans and discussed with the patient. No results found.   ASSESSMENT:  1. Metastatic right breast cancer to the skin, ER/PR negative, HER-2 positive: -Presentation with skin involvement of the right breast, axillary region and posterior chest wall. -12 cycles of weekly Taxol and Herceptin from 07/31/2018 through 10/30/2018 with PET scan showing progression. -Kadcyla from 02/15/2019 through 04/06/2020 with progression. -CT CAP on 04/26/2020 shows right breast mass measuring 3.7 x 2.8 (2.7 x 2.3). CC dimension measures 4.3 cm (3.9 cm). No new areas of metastatic disease. No skeletal metastasis. -Enhertustarted on 05/15/2020 at 3.4 mg/kg.  2. High risk drug monitoring: -2D echo on 04/03/2020 shows EF 65 to 70%.   PLAN:  1. Metastatic right breast cancer to the skin, ER/PR negative, HER-2 positive: -She has tolerated last cycle of Enhertu very well after dose increase. -Reviewed her LFTs.  AST slightly elevated at 52.  Total bilirubin was 1.1. -CBC shows normal white count and platelet count. -Breast mass has slightly improved in size. -She will proceed with Enhertu at the same dose as cycle 2.  I will reevaluate her in 3 weeks.  I plan to repeat scan after 4 cycles.  2. High risk drug monitoring: -Continue echocardiogram every 3 to 4 months.  3. Hypokalemia: -Potassium today is 4.0.  She is not taking any potassium supplements.  4. Skin rash: -Continue steroid cream on lower  back for itching.  5. Elevated LFTs: -AST is 52, up from 44 last visit.  Closely monitor.  6. CKD: -Creatinine is 1.19 and stable.  Continue to avoid nephrotoxins.   Orders placed this encounter:  No orders of the defined types were placed in this encounter.    Derek Jack, MD [facility] [phone]   I, Milinda Antis, am acting as a scribe for Dr. Sanda Linger.  I, Derek Jack MD, have reviewed the above documentation for accuracy and completeness, and I agree with the above.

## 2020-06-26 NOTE — Progress Notes (Signed)
Message received from Leslie RN/ Dr. [name] proceed with treatment. Infuse iron today with treatment. Vital signs are stable. Hgb 7.8 today. Labs reviewed by Dr. [name].   Treatment given today per MD orders. Tolerated infusion without adverse affects. Vital signs stable. No complaints at this time. Discharged from clinic ambulatory. F/U with [facility] as scheduled.

## 2020-06-26 NOTE — Progress Notes (Signed)
.   Intravenous Iron Formulation Change  Ms Switala has insurance that requires a change in intravenous iron product from Feraheme to [employer]. Orders have been updated to reflect this change and scheduling message sent to adjust infusion appointments. Dr [name] notified and agrees with the plan.  Allergies:  Allergies  Allergen Reactions  . Penicillins Rash    Has patient had a PCN reaction causing immediate rash, facial/tongue/throat swelling, SOB or lightheadedness with hypotension: No Has patient had a PCN reaction causing severe rash involving mucus membranes or skin necrosis: No Has patient had a PCN reaction that required hospitalization: No Has patient had a PCN reaction occurring within the last 10 years: No If all of the above answers are "NO", then may proceed with Cephalosporin use.     The plan for iron therapy is as follows: Venofer 200 mg IVPB x 5 doses plus premedications of loratidine 10 mg po x 1, acetaminophen 650 mg po x 1, and famotidine 20 mg po x 1.  Orders entered as such.  Wynona Neat 06/26/2020

## 2020-06-26 NOTE — Patient Instructions (Signed)
[facility] at [facility] Discharge Instructions  You were seen today by Dr. [name]. He went over your recent results. You received your treatment today, Enhertu 4.4 mg/kg. Eat protein-dense meals daily or drink a protein shake to supplement to increase your blood albumin levels. Dr. [name] will see you back in 3 weeks for labs and follow up.   Thank you for choosing [facility] at [facility] to provide your oncology and hematology care.  To afford each patient quality time with our provider, please arrive at least 15 minutes before your scheduled appointment time.   If you have a lab appointment with the [facility] please come in thru the Main Entrance and check in at the main information desk  You need to re-schedule your appointment should you arrive 10 or more minutes late.  We strive to give you quality time with our providers, and arriving late affects you and other patients whose appointments are after yours.  Also, if you no show three or more times for appointments you may be dismissed from the clinic at the providers discretion.     Again, thank you for choosing [facility].  Our hope is that these requests will decrease the amount of time that you wait before being seen by our physicians.       _____________________________________________________________  Should you have questions after your visit to [facility], please contact our office at [phone] between the hours of 8:00 a.m. and 4:30 p.m.  Voicemails left after 4:00 p.m. will not be returned until the following business day.  For prescription refill requests, have your pharmacy contact our office and allow 72 hours.    Cancer Center Support Programs:   > Cancer Support Group  2nd Tuesday of the month 1pm-2pm, Journey Room

## 2020-06-28 ENCOUNTER — Encounter (HOSPITAL_COMMUNITY): Payer: Self-pay

## 2020-06-28 ENCOUNTER — Inpatient Hospital Stay (HOSPITAL_COMMUNITY): Payer: Medicare HMO | Attending: Hematology

## 2020-06-28 VITALS — BP 125/58 | HR 77 | Temp 97.3°F | Resp 18

## 2020-06-28 DIAGNOSIS — E785 Hyperlipidemia, unspecified: Secondary | ICD-10-CM | POA: Diagnosis not present

## 2020-06-28 DIAGNOSIS — Z79899 Other long term (current) drug therapy: Secondary | ICD-10-CM | POA: Insufficient documentation

## 2020-06-28 DIAGNOSIS — R748 Abnormal levels of other serum enzymes: Secondary | ICD-10-CM | POA: Insufficient documentation

## 2020-06-28 DIAGNOSIS — Z171 Estrogen receptor negative status [ER-]: Secondary | ICD-10-CM | POA: Diagnosis not present

## 2020-06-28 DIAGNOSIS — D509 Iron deficiency anemia, unspecified: Secondary | ICD-10-CM | POA: Insufficient documentation

## 2020-06-28 DIAGNOSIS — Z5112 Encounter for antineoplastic immunotherapy: Secondary | ICD-10-CM | POA: Diagnosis not present

## 2020-06-28 DIAGNOSIS — N189 Chronic kidney disease, unspecified: Secondary | ICD-10-CM | POA: Diagnosis not present

## 2020-06-28 DIAGNOSIS — K219 Gastro-esophageal reflux disease without esophagitis: Secondary | ICD-10-CM | POA: Diagnosis not present

## 2020-06-28 DIAGNOSIS — C50411 Malignant neoplasm of upper-outer quadrant of right female breast: Secondary | ICD-10-CM | POA: Diagnosis not present

## 2020-06-28 DIAGNOSIS — E876 Hypokalemia: Secondary | ICD-10-CM | POA: Insufficient documentation

## 2020-06-28 DIAGNOSIS — I129 Hypertensive chronic kidney disease with stage 1 through stage 4 chronic kidney disease, or unspecified chronic kidney disease: Secondary | ICD-10-CM | POA: Diagnosis not present

## 2020-06-28 DIAGNOSIS — Z7982 Long term (current) use of aspirin: Secondary | ICD-10-CM | POA: Diagnosis not present

## 2020-06-28 DIAGNOSIS — Z806 Family history of leukemia: Secondary | ICD-10-CM | POA: Diagnosis not present

## 2020-06-28 DIAGNOSIS — Z9221 Personal history of antineoplastic chemotherapy: Secondary | ICD-10-CM | POA: Diagnosis not present

## 2020-06-28 DIAGNOSIS — D631 Anemia in chronic kidney disease: Secondary | ICD-10-CM | POA: Diagnosis not present

## 2020-06-28 DIAGNOSIS — R21 Rash and other nonspecific skin eruption: Secondary | ICD-10-CM | POA: Insufficient documentation

## 2020-06-28 MED ORDER — ACETAMINOPHEN 325 MG PO TABS
650.0000 mg | ORAL_TABLET | Freq: Once | ORAL | Status: AC
  Administered 2020-06-28: 650 mg via ORAL
  Filled 2020-06-28: qty 2

## 2020-06-28 MED ORDER — SODIUM CHLORIDE 0.9 % IV SOLN
200.0000 mg | Freq: Once | INTRAVENOUS | Status: AC
  Administered 2020-06-28: 200 mg via INTRAVENOUS
  Filled 2020-06-28: qty 10

## 2020-06-28 MED ORDER — SODIUM CHLORIDE 0.9% FLUSH
10.0000 mL | Freq: Once | INTRAVENOUS | Status: AC | PRN
  Administered 2020-06-28: 10 mL

## 2020-06-28 MED ORDER — LORATADINE 10 MG PO TABS
10.0000 mg | ORAL_TABLET | Freq: Once | ORAL | Status: AC
  Administered 2020-06-28: 10 mg via ORAL
  Filled 2020-06-28: qty 1

## 2020-06-28 MED ORDER — HEPARIN SOD (PORK) LOCK FLUSH 100 UNIT/ML IV SOLN
500.0000 [IU] | Freq: Once | INTRAVENOUS | Status: AC | PRN
  Administered 2020-06-28: 500 [IU]

## 2020-06-28 MED ORDER — SODIUM CHLORIDE 0.9 % IV SOLN: Freq: Once | INTRAVENOUS | Status: AC

## 2020-06-28 MED ORDER — FAMOTIDINE 20 MG PO TABS
20.0000 mg | ORAL_TABLET | Freq: Once | ORAL | Status: AC
  Administered 2020-06-28: 20 mg via ORAL
  Filled 2020-06-28: qty 1

## 2020-06-28 NOTE — Progress Notes (Signed)
Patient presents today for Venofer infusion. Vital signs stable. Patient has no complaints of any changes since her last visit. MAR reviewed and updated. Patient denies any pain today.   Venofer infused today. Tolerated infusion without adverse affects. Vital signs stable. No complaints at this time. Discharged from clinic ambulatory. F/U with [facility] as scheduled.

## 2020-06-28 NOTE — Patient Instructions (Signed)
[facility] at [facility]  Discharge Instructions:   _______________________________________________________________  Thank you for choosing [facility] at [facility] to provide your oncology and hematology care.  To afford each patient quality time with our providers, please arrive at least 15 minutes before your scheduled appointment.  You need to re-schedule your appointment if you arrive 10 or more minutes late.  We strive to give you quality time with our providers, and arriving late affects you and other patients whose appointments are after yours.  Also, if you no show three or more times for appointments you may be dismissed from the clinic.  Again, thank you for choosing [facility] at [facility]. Our hope is that these requests will allow you access to exceptional care and in a timely manner. _______________________________________________________________  If you have questions after your visit, please contact our office at [phone] between the hours of 8:30 a.m. and 5:00 p.m. Voicemails left after 4:30 p.m. will not be returned until the following business day. _______________________________________________________________  For prescription refill requests, have your pharmacy contact our office. _______________________________________________________________  Recommendations made by the consultant and any test results will be sent to your referring physician. _______________________________________________________________ 

## 2020-06-30 ENCOUNTER — Encounter (HOSPITAL_COMMUNITY): Payer: Self-pay

## 2020-06-30 ENCOUNTER — Inpatient Hospital Stay (HOSPITAL_COMMUNITY): Payer: Medicare HMO

## 2020-06-30 ENCOUNTER — Other Ambulatory Visit: Payer: Self-pay

## 2020-06-30 VITALS — BP 145/63 | HR 83 | Temp 97.2°F | Resp 18

## 2020-06-30 DIAGNOSIS — C50411 Malignant neoplasm of upper-outer quadrant of right female breast: Secondary | ICD-10-CM

## 2020-06-30 DIAGNOSIS — Z5112 Encounter for antineoplastic immunotherapy: Secondary | ICD-10-CM | POA: Diagnosis not present

## 2020-06-30 DIAGNOSIS — Z171 Estrogen receptor negative status [ER-]: Secondary | ICD-10-CM

## 2020-06-30 MED ORDER — ACETAMINOPHEN 325 MG PO TABS
650.0000 mg | ORAL_TABLET | Freq: Once | ORAL | Status: AC
  Administered 2020-06-30: 650 mg via ORAL
  Filled 2020-06-30: qty 2

## 2020-06-30 MED ORDER — SODIUM CHLORIDE 0.9 % IV SOLN
200.0000 mg | Freq: Once | INTRAVENOUS | Status: AC
  Administered 2020-06-30: 200 mg via INTRAVENOUS
  Filled 2020-06-30: qty 10

## 2020-06-30 MED ORDER — SODIUM CHLORIDE 0.9 % IV SOLN: Freq: Once | INTRAVENOUS | Status: AC

## 2020-06-30 MED ORDER — FAMOTIDINE 20 MG PO TABS
20.0000 mg | ORAL_TABLET | Freq: Once | ORAL | Status: AC
  Administered 2020-06-30: 20 mg via ORAL
  Filled 2020-06-30: qty 1

## 2020-06-30 MED ORDER — HEPARIN SOD (PORK) LOCK FLUSH 100 UNIT/ML IV SOLN
500.0000 [IU] | Freq: Once | INTRAVENOUS | Status: AC
  Administered 2020-06-30: 500 [IU] via INTRAVENOUS

## 2020-06-30 MED ORDER — LORATADINE 10 MG PO TABS
10.0000 mg | ORAL_TABLET | Freq: Once | ORAL | Status: AC
  Administered 2020-06-30: 10 mg via ORAL
  Filled 2020-06-30: qty 1

## 2020-06-30 NOTE — Progress Notes (Signed)
Patient tolerated iron infusion with no complaints voiced. Port site clean and dry with good blood return noted before and after infusion. Band aid applied. VSS with discharge and left in satisfactory condition with no s/s of distress noted.   °

## 2020-07-05 ENCOUNTER — Inpatient Hospital Stay (HOSPITAL_COMMUNITY): Payer: Medicare HMO

## 2020-07-05 ENCOUNTER — Encounter (HOSPITAL_COMMUNITY): Payer: Self-pay

## 2020-07-05 VITALS — BP 130/64 | HR 83 | Temp 96.9°F | Resp 18

## 2020-07-05 DIAGNOSIS — Z5112 Encounter for antineoplastic immunotherapy: Secondary | ICD-10-CM | POA: Diagnosis not present

## 2020-07-05 DIAGNOSIS — C50411 Malignant neoplasm of upper-outer quadrant of right female breast: Secondary | ICD-10-CM

## 2020-07-05 MED ORDER — SODIUM CHLORIDE 0.9 % IV SOLN: Freq: Once | INTRAVENOUS | Status: AC

## 2020-07-05 MED ORDER — SODIUM CHLORIDE 0.9 % IV SOLN
200.0000 mg | Freq: Once | INTRAVENOUS | Status: AC
  Administered 2020-07-05: 200 mg via INTRAVENOUS
  Filled 2020-07-05: qty 200

## 2020-07-05 MED ORDER — SODIUM CHLORIDE 0.9% FLUSH
10.0000 mL | Freq: Once | INTRAVENOUS | Status: AC
  Administered 2020-07-05: 10 mL via INTRAVENOUS

## 2020-07-05 MED ORDER — LORATADINE 10 MG PO TABS
ORAL_TABLET | ORAL | Status: AC
  Filled 2020-07-05: qty 1

## 2020-07-05 MED ORDER — FAMOTIDINE 20 MG PO TABS
ORAL_TABLET | ORAL | Status: AC
  Filled 2020-07-05: qty 1

## 2020-07-05 MED ORDER — LORATADINE 10 MG PO TABS
10.0000 mg | ORAL_TABLET | Freq: Once | ORAL | Status: AC
  Administered 2020-07-05: 10 mg via ORAL

## 2020-07-05 MED ORDER — ACETAMINOPHEN 325 MG PO TABS
650.0000 mg | ORAL_TABLET | Freq: Once | ORAL | Status: AC
  Administered 2020-07-05: 650 mg via ORAL

## 2020-07-05 MED ORDER — FAMOTIDINE 20 MG PO TABS
20.0000 mg | ORAL_TABLET | Freq: Once | ORAL | Status: AC
  Administered 2020-07-05: 20 mg via ORAL

## 2020-07-05 MED ORDER — ACETAMINOPHEN 325 MG PO TABS
ORAL_TABLET | ORAL | Status: AC
  Filled 2020-07-05: qty 2

## 2020-07-05 MED ORDER — HEPARIN SOD (PORK) LOCK FLUSH 100 UNIT/ML IV SOLN
500.0000 [IU] | Freq: Once | INTRAVENOUS | Status: AC
  Administered 2020-07-05: 500 [IU] via INTRAVENOUS

## 2020-07-05 NOTE — Patient Instructions (Signed)
[facility] at [facility]  Discharge Instructions:   _______________________________________________________________  Thank you for choosing [facility] at [facility] to provide your oncology and hematology care.  To afford each patient quality time with our providers, please arrive at least 15 minutes before your scheduled appointment.  You need to re-schedule your appointment if you arrive 10 or more minutes late.  We strive to give you quality time with our providers, and arriving late affects you and other patients whose appointments are after yours.  Also, if you no show three or more times for appointments you may be dismissed from the clinic.  Again, thank you for choosing [facility]. Our hope is that these requests will allow you access to exceptional care and in a timely manner. _______________________________________________________________  If you have questions after your visit, please contact our office at [phone] between the hours of 8:30 a.m. and 5:00 p.m. Voicemails left after 4:30 p.m. will not be returned until the following business day. _______________________________________________________________  For prescription refill requests, have your pharmacy contact our office. _______________________________________________________________  Recommendations made by the consultant and any test results will be sent to your referring physician. _______________________________________________________________ 

## 2020-07-05 NOTE — Progress Notes (Signed)
Tolerated iron infusion well today without incidence.  Vital signs stable prior to discharge.  Discharged ambulatory with husband.   Offered wheelchair.

## 2020-07-07 ENCOUNTER — Encounter (HOSPITAL_COMMUNITY): Payer: Self-pay

## 2020-07-07 ENCOUNTER — Inpatient Hospital Stay (HOSPITAL_COMMUNITY): Payer: Medicare HMO

## 2020-07-07 ENCOUNTER — Other Ambulatory Visit: Payer: Self-pay

## 2020-07-07 VITALS — BP 116/69 | HR 71 | Temp 96.9°F | Resp 18

## 2020-07-07 DIAGNOSIS — C50411 Malignant neoplasm of upper-outer quadrant of right female breast: Secondary | ICD-10-CM

## 2020-07-07 DIAGNOSIS — Z5112 Encounter for antineoplastic immunotherapy: Secondary | ICD-10-CM | POA: Diagnosis not present

## 2020-07-07 MED ORDER — FAMOTIDINE 20 MG PO TABS
ORAL_TABLET | ORAL | Status: AC
  Filled 2020-07-07: qty 1

## 2020-07-07 MED ORDER — SODIUM CHLORIDE 0.9 % IV SOLN: Freq: Once | INTRAVENOUS | Status: AC

## 2020-07-07 MED ORDER — SODIUM CHLORIDE 0.9% FLUSH
10.0000 mL | Freq: Once | INTRAVENOUS | Status: AC
  Administered 2020-07-07: 10 mL via INTRAVENOUS

## 2020-07-07 MED ORDER — HEPARIN SOD (PORK) LOCK FLUSH 100 UNIT/ML IV SOLN
500.0000 [IU] | Freq: Once | INTRAVENOUS | Status: AC
  Administered 2020-07-07: 500 [IU] via INTRAVENOUS

## 2020-07-07 MED ORDER — SODIUM CHLORIDE 0.9 % IV SOLN
200.0000 mg | Freq: Once | INTRAVENOUS | Status: AC
  Administered 2020-07-07: 200 mg via INTRAVENOUS
  Filled 2020-07-07: qty 200

## 2020-07-07 MED ORDER — FAMOTIDINE 20 MG PO TABS
20.0000 mg | ORAL_TABLET | Freq: Once | ORAL | Status: AC
  Administered 2020-07-07: 20 mg via ORAL

## 2020-07-07 MED ORDER — LORATADINE 10 MG PO TABS
10.0000 mg | ORAL_TABLET | Freq: Once | ORAL | Status: AC
  Administered 2020-07-07: 10 mg via ORAL

## 2020-07-07 MED ORDER — ACETAMINOPHEN 325 MG PO TABS
ORAL_TABLET | ORAL | Status: AC
  Filled 2020-07-07: qty 2

## 2020-07-07 MED ORDER — LORATADINE 10 MG PO TABS
ORAL_TABLET | ORAL | Status: AC
  Filled 2020-07-07: qty 1

## 2020-07-07 MED ORDER — ACETAMINOPHEN 325 MG PO TABS
650.0000 mg | ORAL_TABLET | Freq: Once | ORAL | Status: AC
  Administered 2020-07-07: 650 mg via ORAL

## 2020-07-07 NOTE — Progress Notes (Signed)
Tolerated IV iron well today without incidence.  Vital signs stable prior to discharge.  Discharged ambulatory.

## 2020-07-07 NOTE — Patient Instructions (Signed)
[facility] at [facility]  Discharge Instructions:   _______________________________________________________________  Thank you for choosing [facility] at [facility] to provide your oncology and hematology care.  To afford each patient quality time with our providers, please arrive at least 15 minutes before your scheduled appointment.  You need to re-schedule your appointment if you arrive 10 or more minutes late.  We strive to give you quality time with our providers, and arriving late affects you and other patients whose appointments are after yours.  Also, if you no show three or more times for appointments you may be dismissed from the clinic.  Again, thank you for choosing [facility]. Our hope is that these requests will allow you access to exceptional care and in a timely manner. _______________________________________________________________  If you have questions after your visit, please contact our office at [phone] between the hours of 8:30 a.m. and 5:00 p.m. Voicemails left after 4:30 p.m. will not be returned until the following business day. _______________________________________________________________  For prescription refill requests, have your pharmacy contact our office. _______________________________________________________________  Recommendations made by the consultant and any test results will be sent to your referring physician. _______________________________________________________________ 

## 2020-07-13 DIAGNOSIS — R69 Illness, unspecified: Secondary | ICD-10-CM | POA: Diagnosis not present

## 2020-07-17 ENCOUNTER — Other Ambulatory Visit: Payer: Self-pay

## 2020-07-17 ENCOUNTER — Inpatient Hospital Stay (HOSPITAL_BASED_OUTPATIENT_CLINIC_OR_DEPARTMENT_OTHER): Payer: Medicare HMO | Admitting: Hematology

## 2020-07-17 ENCOUNTER — Inpatient Hospital Stay (HOSPITAL_COMMUNITY): Payer: Medicare HMO

## 2020-07-17 VITALS — BP 131/53 | HR 97 | Temp 97.1°F | Resp 18

## 2020-07-17 VITALS — BP 134/70 | HR 96 | Temp 96.9°F | Resp 19 | Wt 170.0 lb

## 2020-07-17 DIAGNOSIS — Z171 Estrogen receptor negative status [ER-]: Secondary | ICD-10-CM

## 2020-07-17 DIAGNOSIS — C50411 Malignant neoplasm of upper-outer quadrant of right female breast: Secondary | ICD-10-CM

## 2020-07-17 DIAGNOSIS — Z5112 Encounter for antineoplastic immunotherapy: Secondary | ICD-10-CM | POA: Diagnosis not present

## 2020-07-17 LAB — CBC WITH DIFFERENTIAL/PLATELET
Abs Immature Granulocytes: 0.02 10*3/uL (ref 0.00–0.07)
Basophils Absolute: 0.1 10*3/uL (ref 0.0–0.1)
Basophils Relative: 1 %
Eosinophils Absolute: 0.7 10*3/uL — ABNORMAL HIGH (ref 0.0–0.5)
Eosinophils Relative: 8 %
HCT: 29 % — ABNORMAL LOW (ref 36.0–46.0)
Hemoglobin: 9.5 g/dL — ABNORMAL LOW (ref 12.0–15.0)
Immature Granulocytes: 0 %
Lymphocytes Relative: 37 %
Lymphs Abs: 3.1 10*3/uL (ref 0.7–4.0)
MCH: 33.5 pg (ref 26.0–34.0)
MCHC: 32.8 g/dL (ref 30.0–36.0)
MCV: 102.1 fL — ABNORMAL HIGH (ref 80.0–100.0)
Monocytes Absolute: 1.2 10*3/uL — ABNORMAL HIGH (ref 0.1–1.0)
Monocytes Relative: 14 %
Neutro Abs: 3.3 10*3/uL (ref 1.7–7.7)
Neutrophils Relative %: 40 %
Platelets: 201 10*3/uL (ref 150–400)
RBC: 2.84 MIL/uL — ABNORMAL LOW (ref 3.87–5.11)
RDW: 25.5 % — ABNORMAL HIGH (ref 11.5–15.5)
WBC: 8.3 10*3/uL (ref 4.0–10.5)
nRBC: 0 % (ref 0.0–0.2)

## 2020-07-17 LAB — COMPREHENSIVE METABOLIC PANEL
ALT: 26 U/L (ref 0–44)
AST: 51 U/L — ABNORMAL HIGH (ref 15–41)
Albumin: 2.8 g/dL — ABNORMAL LOW (ref 3.5–5.0)
Alkaline Phosphatase: 138 U/L — ABNORMAL HIGH (ref 38–126)
Anion gap: 9 (ref 5–15)
BUN: 21 mg/dL (ref 8–23)
CO2: 20 mmol/L — ABNORMAL LOW (ref 22–32)
Calcium: 8.7 mg/dL — ABNORMAL LOW (ref 8.9–10.3)
Chloride: 108 mmol/L (ref 98–111)
Creatinine, Ser: 1.04 mg/dL — ABNORMAL HIGH (ref 0.44–1.00)
GFR calc Af Amer: 56 mL/min — ABNORMAL LOW (ref >60)
GFR calc non Af Amer: 48 mL/min — ABNORMAL LOW (ref >60)
Glucose, Bld: 93 mg/dL (ref 70–99)
Potassium: 3.7 mmol/L (ref 3.5–5.1)
Sodium: 137 mmol/L (ref 135–145)
Total Bilirubin: 1.2 mg/dL (ref 0.3–1.2)
Total Protein: 6.2 g/dL — ABNORMAL LOW (ref 6.5–8.1)

## 2020-07-17 LAB — MAGNESIUM: Magnesium: 1.8 mg/dL (ref 1.7–2.4)

## 2020-07-17 MED ORDER — DIPHENHYDRAMINE HCL 25 MG PO CAPS
50.0000 mg | ORAL_CAPSULE | Freq: Once | ORAL | Status: AC
  Administered 2020-07-17: 50 mg via ORAL
  Filled 2020-07-17: qty 2

## 2020-07-17 MED ORDER — PALONOSETRON HCL INJECTION 0.25 MG/5ML
0.2500 mg | Freq: Once | INTRAVENOUS | Status: AC
  Administered 2020-07-17: 0.25 mg via INTRAVENOUS
  Filled 2020-07-17: qty 5

## 2020-07-17 MED ORDER — DEXTROSE 5 % IV SOLN: Freq: Once | INTRAVENOUS | Status: AC

## 2020-07-17 MED ORDER — HEPARIN SOD (PORK) LOCK FLUSH 100 UNIT/ML IV SOLN
500.0000 [IU] | Freq: Once | INTRAVENOUS | Status: AC | PRN
  Administered 2020-07-17: 500 [IU]

## 2020-07-17 MED ORDER — ACETAMINOPHEN 325 MG PO TABS
650.0000 mg | ORAL_TABLET | Freq: Once | ORAL | Status: AC
  Administered 2020-07-17: 650 mg via ORAL
  Filled 2020-07-17: qty 2

## 2020-07-17 MED ORDER — SODIUM CHLORIDE 0.9% FLUSH
10.0000 mL | INTRAVENOUS | Status: DC | PRN
  Administered 2020-07-17: 10 mL

## 2020-07-17 MED ORDER — FAM-TRASTUZUMAB DERUXTECAN-NXKI CHEMO 100 MG IV SOLR
4.4000 mg/kg | Freq: Once | INTRAVENOUS | Status: AC
  Administered 2020-07-17: 312 mg via INTRAVENOUS
  Filled 2020-07-17: qty 15.6

## 2020-07-17 MED ORDER — SODIUM CHLORIDE 0.9 % IV SOLN
10.0000 mg | Freq: Once | INTRAVENOUS | Status: AC
  Administered 2020-07-17: 10 mg via INTRAVENOUS
  Filled 2020-07-17: qty 10

## 2020-07-17 NOTE — Patient Instructions (Signed)
[facility] at [facility] Discharge Instructions  You were seen today by Dr. Katragadda. He went over your recent results. You received your treatment today. You will be scheduled for a CT scan of your chest and abdomen before your next visit. Dr. Katragadda will see you back in 3 weeks for labs and follow up.   Thank you for choosing [facility] at [facility] to provide your oncology and hematology care.  To afford each patient quality time with our provider, please arrive at least 15 minutes before your scheduled appointment time.   If you have a lab appointment with the Cancer Center please come in thru the Main Entrance and check in at the main information desk  You need to re-schedule your appointment should you arrive 10 or more minutes late.  We strive to give you quality time with our providers, and arriving late affects you and other patients whose appointments are after yours.  Also, if you no show three or more times for appointments you may be dismissed from the clinic at the providers discretion.     Again, thank you for choosing [facility].  Our hope is that these requests will decrease the amount of time that you wait before being seen by our physicians.       _____________________________________________________________  Should you have questions after your visit to [facility], please contact our office at [phone] between the hours of 8:00 a.m. and 4:30 p.m.  Voicemails left after 4:00 p.m. will not be returned until the following business day.  For prescription refill requests, have your pharmacy contact our office and allow 72 hours.    Cancer Center Support Programs:   > Cancer Support Group  2nd Tuesday of the month 1pm-2pm, Journey Room    

## 2020-07-17 NOTE — Progress Notes (Signed)
Ok to treat today verbal order Dr. Katragadda.   Patient tolerated chemotherapy with no complaints voiced.  Side effects with management reviewed with understanding verbalized.  Port site clean and dry with no bruising or swelling noted at site.  Good blood return noted before and after administration of chemotherapy.  Band aid applied.  Patient left in satisfactory condition with VSS and no s/s of distress noted.  

## 2020-07-17 NOTE — Addendum Note (Signed)
Addended by: Donetta Potts on: 07/17/2020 01:21 PM   Modules accepted: Orders

## 2020-07-17 NOTE — Progress Notes (Signed)
[place] [address]  [address]   CLINIC:  Medical Oncology/Hematology  PCP:  [name], MD [address] [name] [address] [phone]   REASON FOR VISIT:  Follow-up for HER-2 positive right breast cancer to skin  PRIOR THERAPY:  1. Herceptin and Taxol x 12 cycles from 07/31/2018 to 10/30/2018. 2. Kadcyla x 17 cycles from 02/15/2019 to 04/06/2020.  NGS Results: Not done  CURRENT THERAPY: Enhertu every 3 weeks  BRIEF ONCOLOGIC HISTORY:  Oncology History  Malignant neoplasm of upper-outer quadrant of right breast in female, estrogen receptor negative ([place])  07/03/2018 Initial Diagnosis   Palpable right breast mass for several months with skin discoloration, clinically skin trabecular thickening with nipple retraction measuring 7.2 x 4.3 x 4.2 cm, additional mass 4 o'clock position 2.6 cm, right axillary tail 1.2 cm right axillary lymph node 1.8 cm left breast irregular mass 1 cm, adjacent mass 0.9 cm together measuring 1.9 cm, no lymphadenopathy   07/03/2018 Pathology Results   Right breast biopsy: IDC grade 2, ER 0%, PR 0%, HER-2 positive, Ki-67 40%, lymph node biopsy negative, left breast biopsy complex sclerosing lesion    07/15/2018 Cancer Staging   Staging form: Breast, AJCC 8th Edition - Clinical: Stage IIB (cT3, cN0, cM0, G2, ER-, PR-, HER2+) - Signed by [name], MD on 07/15/2018   07/31/2018 - 10/30/2018 Chemotherapy   The patient had trastuzumab (HERCEPTIN) 336 mg in sodium chloride 0.9 % 250 mL chemo infusion, 4 mg/kg = 336 mg, Intravenous,  Once, 4 of 4 cycles Administration: 336 mg (07/31/2018), 168 mg (08/07/2018), 168 mg (08/28/2018), 168 mg (08/14/2018), 168 mg (08/21/2018), 168 mg (09/04/2018), 168 mg (09/11/2018), 168 mg (09/18/2018), 168 mg (10/02/2018), 168 mg (10/09/2018), 168 mg (10/16/2018), 168 mg (10/23/2018), 168 mg (10/30/2018) PACLitaxel (TAXOL) 78 mg in sodium chloride 0.9 % 250 mL chemo infusion (</=  24m/m2), 40 mg/m2 = 78 mg (50 % of original dose 80 mg/m2), Intravenous,  Once, 4 of 4 cycles Dose modification: 40 mg/m2 (50 % of original dose 80 mg/m2, Cycle 1, Reason: Patient Age), 53.3333 mg/m2 (66.7 % of original dose 80 mg/m2, Cycle 1, Reason: Provider Judgment), 45 mg/m2 (original dose 80 mg/m2, Cycle 3, Reason: Provider Judgment) Administration: 78 mg (07/31/2018), 102 mg (08/07/2018), 102 mg (08/28/2018), 102 mg (08/14/2018), 102 mg (08/21/2018), 102 mg (09/04/2018), 102 mg (09/18/2018), 84 mg (10/02/2018), 84 mg (10/09/2018), 84 mg (10/16/2018), 84 mg (10/23/2018), 84 mg (10/30/2018)  for chemotherapy treatment.    11/13/2018 - 02/14/2019 Chemotherapy   The patient had trastuzumab (HERCEPTIN) 504 mg in sodium chloride 0.9 % 250 mL chemo infusion, 6 mg/kg = 504 mg (100 % of original dose 6 mg/kg), Intravenous,  Once, 4 of 5 cycles Dose modification: 6 mg/kg (original dose 6 mg/kg, Cycle 1, Reason: Other (see comments), Comment: pt receving it ) Administration: 504 mg (11/13/2018), 504 mg (12/04/2018), 504 mg (01/04/2019), 504 mg (01/25/2019)  for chemotherapy treatment.    02/15/2019 - 04/06/2020 Chemotherapy   The patient had ado-trastuzumab emtansine (KADCYLA) 240 mg in sodium chloride 0.9 % 250 mL chemo infusion, 3 mg/kg = 240 mg (100 % of original dose 3 mg/kg), Intravenous, Once, 17 of 18 cycles Dose modification: 3 mg/kg (original dose 3 mg/kg, Cycle 1, Reason: Provider Judgment), 3 mg/kg (original dose 3 mg/kg, Cycle 2, Reason: Provider Judgment), 2.4 mg/kg (original dose 3 mg/kg, Cycle 11, Reason: Other (see comments), Comment: bili upper limit of normal), 2.4 mg/kg (original dose 3  mg/kg, Cycle 12, Reason: Other (see comments), Comment: elevated bilirubin) Administration: 240 mg (02/15/2019), 240 mg (03/08/2019), 240 mg (05/10/2019), 240 mg (03/29/2019), 240 mg (04/19/2019), 240 mg (06/01/2019), 240 mg (06/22/2019), 240 mg (07/20/2019), 240 mg (08/10/2019), 240 mg (09/01/2019), 200 mg (09/22/2019), 180 mg  (11/03/2019), 180 mg (12/09/2019), 180 mg (01/06/2020), 180 mg (02/03/2020), 180 mg (03/09/2020), 180 mg (04/06/2020)  for chemotherapy treatment.    05/15/2020 -  Chemotherapy   The patient had dexamethasone (DECADRON) 4 MG tablet, 8 mg, Oral, Daily, 1 of 1 cycle, Start date: --, End date: -- palonosetron (ALOXI) injection 0.25 mg, 0.25 mg, Intravenous,  Once, 3 of 6 cycles Administration: 0.25 mg (05/15/2020), 0.25 mg (06/05/2020), 0.25 mg (06/26/2020) fam-trastuzumab deruxtecan-nxki (ENHERTU) 226 mg in dextrose 5 % 100 mL chemo infusion, 3.2 mg/kg = 226 mg (59.3 % of original dose 5.4 mg/kg), Intravenous,  Once, 3 of 6 cycles Dose modification: 3.2 mg/kg (original dose 5.4 mg/kg, Cycle 1, Reason: Provider Judgment), 4.4 mg/kg (original dose 5.4 mg/kg, Cycle 2, Reason: Provider Judgment), 4.4 mg/kg (original dose 5.4 mg/kg, Cycle 5, Reason: Provider Judgment), 4.4 mg/kg (original dose 5.4 mg/kg, Cycle 3, Reason: Provider Judgment) Administration: 226 mg (05/15/2020), 312 mg (06/05/2020), 312 mg (06/26/2020)  for chemotherapy treatment.      CANCER STAGING: Cancer Staging Malignant neoplasm of upper-outer quadrant of right breast in female, estrogen receptor negative ([place]) Staging form: Breast, AJCC 8th Edition - Clinical: Stage IIB (cT3, cN0, cM0, G2, ER-, PR-, HER2+) - Signed by [name], MD on 07/15/2018   INTERVAL HISTORY:  [name], a 86 y.o. female, returns for routine follow-up and consideration for next cycle of chemotherapy. Yuka was last seen on 06/26/2020.  Due for cycle #4 of Enhertu today.   Today she is accompanied by her husband. Overall, she tells me she has been feeling pretty well. She tolerated the last treatment well. She reports that her energy levels improved after the Venofer on 9/10. Her appetite is great.  She will go out to the porch once in a while, but otherwise she eats and sleeps most days.   Overall, she feels ready for next cycle of chemo today.    REVIEW  OF SYSTEMS:  Review of Systems  Constitutional: Positive for fatigue (moderate). Negative for appetite change.  Psychiatric/Behavioral: Positive for sleep disturbance.  All other systems reviewed and are negative.   PAST MEDICAL/SURGICAL HISTORY:  Past Medical History:  Diagnosis Date  . Cancer ([place]) 06/2018   right breast cancer  . GERD (gastroesophageal reflux disease)    occasional   . Hyperlipidemia   . Hypertension    clearance with note Dr Nevada Crane on chart  . Pneumonia    2 years ago/ states occ cough with sinus drainage- no fever  . Thyroid nodule    with biopsy- states following medically   Past Surgical History:  Procedure Laterality Date  . CATARACT EXTRACTION W/PHACO  11/30/2012   Procedure: CATARACT EXTRACTION PHACO AND INTRAOCULAR LENS PLACEMENT (IOC);  Surgeon: Williams Che, MD;  Location: [facility];  Service: Ophthalmology;  Laterality: Left;  CDE:11.49  . CATARACT EXTRACTION W/PHACO Right 03/15/2013   Procedure: CATARACT EXTRACTION PHACO AND INTRAOCULAR LENS PLACEMENT (IOC);  Surgeon: Williams Che, MD;  Location: [facility];  Service: Ophthalmology;  Laterality: Right;  CDE 16.15  . COLONOSCOPY    . EXCISION OF SKIN TAG Right 06/17/2016   Procedure: SHAVE OF SKIN TAG RIGHT BUTTOCK;  Surgeon: Aviva Signs, MD;  Location: [facility];  Service: General;  Laterality: Right;  . JOINT REPLACEMENT     left knee  . MASS EXCISION Left 06/17/2016   Procedure: EXCISION SKIN MALIGNANT LESION LEFT BUTTOCK;  Surgeon: Aviva Signs, MD;  Location: [facility];  Service: General;  Laterality: Left;  . PORTACATH PLACEMENT Right 07/28/2018   Procedure: INSERTION PORT-A-CATH WITH ULTRASOUND;  Surgeon: Rolm Bookbinder, MD;  Location: [facility];  Service: General;  Laterality: Right;  . PUNCH BIOPSY OF SKIN Right 07/28/2018   Procedure: PUNCH BIOPSY OF SKIN RIGHT BREAST;  Surgeon: Rolm Bookbinder, MD;  Location: [facility];  Service: General;  Laterality: Right;  .  TOTAL KNEE ARTHROPLASTY  12/16/2011   Procedure: TOTAL KNEE ARTHROPLASTY;  Surgeon: Gearlean Alf, MD;  Location: [facility];  Service: Orthopedics;  Laterality: Right;  . TUBAL LIGATION      SOCIAL HISTORY:  Social History   Socioeconomic History  . Marital status: Married    Spouse name: Not on file  . Number of children: 2  . Years of education: some business school after HS  . Highest education level: Not on file  Occupational History  . Occupation: Cabin crew  . Occupation: Network engineer   Tobacco Use  . Smoking status: Never Smoker  . Smokeless tobacco: Never Used  Vaping Use  . Vaping Use: Never used  Substance and Sexual Activity  . Alcohol use: No  . Drug use: No  . Sexual activity: Yes    Birth control/protection: None  Other Topics Concern  . Not on file  Social History Narrative   Lives at home with her husband.   4 cups caffeine per day.   Right-handed.   Social Determinants of Health   Financial Resource Strain:   . Difficulty of Paying Living Expenses: Not on file  Food Insecurity:   . Worried About Charity fundraiser in the Last Year: Not on file  . Ran Out of Food in the Last Year: Not on file  Transportation Needs:   . Lack of Transportation (Medical): Not on file  . Lack of Transportation (Non-Medical): Not on file  Physical Activity:   . Days of Exercise per Week: Not on file  . Minutes of Exercise per Session: Not on file  Stress:   . Feeling of Stress : Not on file  Social Connections:   . Frequency of Communication with Friends and Family: Not on file  . Frequency of Social Gatherings with Friends and Family: Not on file  . Attends Religious Services: Not on file  . Active Member of Clubs or Organizations: Not on file  . Attends Archivist Meetings: Not on file  . Marital Status: Not on file  Intimate Partner Violence:   . Fear of Current or Ex-Partner: Not on file  . Emotionally Abused: Not on file  . Physically Abused: Not on file    . Sexually Abused: Not on file    FAMILY HISTORY:  Family History  Problem Relation Age of Onset  . Heart attack Mother   . Bronchitis Father   . Diabetes Sister   . Leukemia Brother   . Lung disease Brother   . Lung disease Sister     CURRENT MEDICATIONS:  Current Outpatient Medications  Medication Sig Dispense Refill  . acetaminophen (TYLENOL) 325 MG tablet Take 650 mg by mouth every 6 (six) hours as needed.     Marland Kitchen aspirin 325 MG tablet Take 162.5 mg by mouth daily.     Marland Kitchen  cetirizine (ZYRTEC) 10 MG tablet Take 10 mg by mouth daily.    . hydrocortisone 1 % ointment Apply 1 application topically 2 (two) times daily. 60 g 0  . lidocaine-prilocaine (EMLA) cream Apply to port site one hour prior to appointment and cover with plastic wrap. 30 g 3  . Misc. Devices MISC Please provide lymphedema sleeve for right arm 2 each 0  . Multiple Vitamin (MULTIVITAMIN WITH MINERALS) TABS tablet Take 1 tablet by mouth daily.    Glory Rosebush ULTRA test strip USE 1 STRIP TO CHECK GLUCOSE 2 TO 3 TIMES DAILY     No current facility-administered medications for this visit.   Facility-Administered Medications Ordered in Other Visits  Medication Dose Route Frequency Provider Last Rate Last Admin  . sodium chloride flush (NS) 0.9 % injection 10 mL  10 mL Intravenous PRN Derek Jack, MD   10 mL at 05/23/20 1025  . sodium chloride flush (NS) 0.9 % injection 10 mL  10 mL Intravenous PRN Derek Jack, MD   10 mL at 05/23/20 0900    ALLERGIES:  Allergies  Allergen Reactions  . Penicillins Rash    Has patient had a PCN reaction causing immediate rash, facial/tongue/throat swelling, SOB or lightheadedness with hypotension: No Has patient had a PCN reaction causing severe rash involving mucus membranes or skin necrosis: No Has patient had a PCN reaction that required hospitalization: No Has patient had a PCN reaction occurring within the last 10 years: No If all of the above answers are "NO",  then may proceed with Cephalosporin use.     PHYSICAL EXAM:  Performance status (ECOG): 1 - Symptomatic but completely ambulatory  Vitals:   07/17/20 1208  BP: 134/70  Pulse: 96  Resp: 19  Temp: (!) 96.9 F (36.1 C)  SpO2: 100%   Wt Readings from Last 3 Encounters:  07/17/20 170 lb (77.1 kg)  06/26/20 164 lb 11.2 oz (74.7 kg)  06/05/20 163 lb 9.6 oz (74.2 kg)   Physical Exam Vitals reviewed.  Constitutional:      Appearance: Normal appearance.  Cardiovascular:     Rate and Rhythm: Normal rate and regular rhythm.     Pulses: Normal pulses.     Heart sounds: Murmur (systolic ejection murmur) heard.   Pulmonary:     Effort: Pulmonary effort is normal.     Breath sounds: Normal breath sounds.  Chest:     Comments: Port-a-Cath in R chest Musculoskeletal:     Right lower leg: No edema.     Left lower leg: No edema.  Lymphadenopathy:     Upper Body:     Right upper body: No supraclavicular, axillary or pectoral adenopathy.     Left upper body: No supraclavicular, axillary or pectoral adenopathy.  Neurological:     General: No focal deficit present.     Mental Status: She is alert and oriented to person, place, and time.  Psychiatric:        Mood and Affect: Mood normal.        Behavior: Behavior normal.     LABORATORY DATA:  I have reviewed the labs as listed.  CBC Latest Ref Rng & Units 07/17/2020 06/26/2020 06/05/2020  WBC 4.0 - 10.5 K/uL 8.3 7.4 6.0  Hemoglobin 12.0 - 15.0 g/dL 9.5(L) 7.8(L) 8.0(L)  Hematocrit 36 - 46 % 29.0(L) 23.9(L) 24.8(L)  Platelets 150 - 400 K/uL 201 210 184   CMP Latest Ref Rng & Units 07/17/2020 06/26/2020 06/05/2020  Glucose 70 - 99  mg/dL 93 122(H) 90  BUN 8 - 23 mg/dL 21 26(H) 24(H)  Creatinine 0.44 - 1.00 mg/dL 1.04(H) 1.19(H) 1.11(H)  Sodium 135 - 145 mmol/L 137 138 136  Potassium 3.5 - 5.1 mmol/L 3.7 4.0 3.5  Chloride 98 - 111 mmol/L 108 109 107  CO2 22 - 32 mmol/L 20(L) 19(L) 19(L)  Calcium 8.9 - 10.3 mg/dL 8.7(L) 9.4 8.4(L)    Total Protein 6.5 - 8.1 g/dL 6.2(L) 5.9(L) 6.2(L)  Total Bilirubin 0.3 - 1.2 mg/dL 1.2 1.1 1.1  Alkaline Phos 38 - 126 U/L 138(H) 120 112  AST 15 - 41 U/L 51(H) 52(H) 44(H)  ALT 0 - 44 U/L 26 24 21     DIAGNOSTIC IMAGING:  I have independently reviewed the scans and discussed with the patient. No results found.   ASSESSMENT:  1. Metastatic right breast cancer to the skin, ER/PR negative, HER-2 positive: -Presentation with skin involvement of the right breast, axillary region and posterior chest wall. -12 cycles of weekly Taxol and Herceptin from 07/31/2018 through 10/30/2018 with PET scan showing progression. -Kadcyla from 02/15/2019 through 04/06/2020 with progression. -CT CAP on 04/26/2020 shows right breast mass measuring 3.7 x 2.8 (2.7 x 2.3). CC dimension measures 4.3 cm (3.9 cm). No new areas of metastatic disease. No skeletal metastasis. -Enhertustarted on 05/15/2020 at 3.4 mg/kg.  2. High risk drug monitoring: -2D echo on 04/03/2020 shows EF 65 to 70%.   PLAN:  1. Metastatic right breast cancer to the skin, ER/PR negative, HER-2 positive: -She tolerated cycle 3 of Enhertu very well at a dose of 4.4 mg/kg. -Reviewed her labs today.  LFTs shows total bilirubin of 1.2.  Albumin is 2.8.  AST is mildly elevated at 51. -Will continue Enhertu at the same dose today. -We will plan to see her back in 3 weeks for follow-up.  We will plan to repeat CT CAP prior to next visit.  2. High risk drug monitoring: -We will continue to monitor echocardiogram every 3 to 4 months.  3. Hypokalemia: -She is not on any potassium supplements.  Potassium today is 3.7.  4. Skin rash: -Continue steroid cream on lower back for itching.  5. Elevated LFTs: -AST is 51.  Total bilirubin is 1.2.  We will closely monitor.  6. CKD: -Creatinine is 1.04.  Continue to avoid nephrotoxins.  7.  Macrocytic anemia: -Anemia from iron deficiency and CKD. -Received 5 infusions of Venofer from  06/26/2020 through 19 2021. -Hemoglobin improved to 9.5 from 7.8.  No need for Venofer today.   Orders placed this encounter:  No orders of the defined types were placed in this encounter.    Derek Jack, MD [place] [phone]   I, Milinda Antis, am acting as a scribe for Dr. Sanda Linger.  I, Derek Jack MD, have reviewed the above documentation for accuracy and completeness, and I agree with the above.

## 2020-07-19 DIAGNOSIS — K219 Gastro-esophageal reflux disease without esophagitis: Secondary | ICD-10-CM | POA: Diagnosis not present

## 2020-07-19 DIAGNOSIS — I10 Essential (primary) hypertension: Secondary | ICD-10-CM | POA: Diagnosis not present

## 2020-07-19 DIAGNOSIS — E782 Mixed hyperlipidemia: Secondary | ICD-10-CM | POA: Diagnosis not present

## 2020-07-19 DIAGNOSIS — E1121 Type 2 diabetes mellitus with diabetic nephropathy: Secondary | ICD-10-CM | POA: Diagnosis not present

## 2020-07-24 DIAGNOSIS — J309 Allergic rhinitis, unspecified: Secondary | ICD-10-CM | POA: Diagnosis not present

## 2020-07-24 DIAGNOSIS — R2240 Localized swelling, mass and lump, unspecified lower limb: Secondary | ICD-10-CM | POA: Diagnosis not present

## 2020-07-24 DIAGNOSIS — R945 Abnormal results of liver function studies: Secondary | ICD-10-CM | POA: Diagnosis not present

## 2020-07-24 DIAGNOSIS — N6459 Other signs and symptoms in breast: Secondary | ICD-10-CM | POA: Diagnosis not present

## 2020-07-24 DIAGNOSIS — R2231 Localized swelling, mass and lump, right upper limb: Secondary | ICD-10-CM | POA: Diagnosis not present

## 2020-07-24 DIAGNOSIS — D631 Anemia in chronic kidney disease: Secondary | ICD-10-CM | POA: Diagnosis not present

## 2020-07-24 DIAGNOSIS — R2243 Localized swelling, mass and lump, lower limb, bilateral: Secondary | ICD-10-CM | POA: Diagnosis not present

## 2020-07-24 DIAGNOSIS — J060 Acute laryngopharyngitis: Secondary | ICD-10-CM | POA: Diagnosis not present

## 2020-07-24 DIAGNOSIS — L539 Erythematous condition, unspecified: Secondary | ICD-10-CM | POA: Diagnosis not present

## 2020-07-24 DIAGNOSIS — N1832 Chronic kidney disease, stage 3b: Secondary | ICD-10-CM | POA: Diagnosis not present

## 2020-07-24 DIAGNOSIS — Z0000 Encounter for general adult medical examination without abnormal findings: Secondary | ICD-10-CM | POA: Diagnosis not present

## 2020-07-26 DIAGNOSIS — C50911 Malignant neoplasm of unspecified site of right female breast: Secondary | ICD-10-CM | POA: Diagnosis not present

## 2020-07-26 DIAGNOSIS — R945 Abnormal results of liver function studies: Secondary | ICD-10-CM | POA: Diagnosis not present

## 2020-07-26 DIAGNOSIS — R7301 Impaired fasting glucose: Secondary | ICD-10-CM | POA: Diagnosis not present

## 2020-07-26 DIAGNOSIS — E782 Mixed hyperlipidemia: Secondary | ICD-10-CM | POA: Diagnosis not present

## 2020-07-26 DIAGNOSIS — E559 Vitamin D deficiency, unspecified: Secondary | ICD-10-CM | POA: Diagnosis not present

## 2020-07-26 DIAGNOSIS — R7303 Prediabetes: Secondary | ICD-10-CM | POA: Diagnosis not present

## 2020-07-26 DIAGNOSIS — E663 Overweight: Secondary | ICD-10-CM | POA: Diagnosis not present

## 2020-07-26 DIAGNOSIS — I129 Hypertensive chronic kidney disease with stage 1 through stage 4 chronic kidney disease, or unspecified chronic kidney disease: Secondary | ICD-10-CM | POA: Diagnosis not present

## 2020-07-26 DIAGNOSIS — Z23 Encounter for immunization: Secondary | ICD-10-CM | POA: Diagnosis not present

## 2020-07-26 DIAGNOSIS — D631 Anemia in chronic kidney disease: Secondary | ICD-10-CM | POA: Diagnosis not present

## 2020-08-03 ENCOUNTER — Other Ambulatory Visit: Payer: Self-pay

## 2020-08-03 ENCOUNTER — Encounter (HOSPITAL_COMMUNITY): Payer: Self-pay

## 2020-08-03 ENCOUNTER — Ambulatory Visit (HOSPITAL_COMMUNITY)
Admission: RE | Admit: 2020-08-03 | Discharge: 2020-08-03 | Disposition: A | Discharge status: Home / Self Care | Payer: Medicare HMO | Source: Ambulatory Visit | Attending: Hematology | Admitting: Hematology

## 2020-08-03 DIAGNOSIS — N6310 Unspecified lump in the right breast, unspecified quadrant: Secondary | ICD-10-CM | POA: Diagnosis not present

## 2020-08-03 DIAGNOSIS — K449 Diaphragmatic hernia without obstruction or gangrene: Secondary | ICD-10-CM | POA: Diagnosis not present

## 2020-08-03 DIAGNOSIS — K5730 Diverticulosis of large intestine without perforation or abscess without bleeding: Secondary | ICD-10-CM | POA: Diagnosis not present

## 2020-08-03 DIAGNOSIS — I700 Atherosclerosis of aorta: Secondary | ICD-10-CM | POA: Diagnosis not present

## 2020-08-03 DIAGNOSIS — C50911 Malignant neoplasm of unspecified site of right female breast: Secondary | ICD-10-CM | POA: Diagnosis not present

## 2020-08-03 DIAGNOSIS — C50411 Malignant neoplasm of upper-outer quadrant of right female breast: Secondary | ICD-10-CM | POA: Diagnosis present

## 2020-08-03 DIAGNOSIS — Z171 Estrogen receptor negative status [ER-]: Secondary | ICD-10-CM | POA: Diagnosis present

## 2020-08-03 DIAGNOSIS — K7460 Unspecified cirrhosis of liver: Secondary | ICD-10-CM | POA: Diagnosis not present

## 2020-08-03 DIAGNOSIS — I2510 Atherosclerotic heart disease of native coronary artery without angina pectoris: Secondary | ICD-10-CM | POA: Diagnosis not present

## 2020-08-03 MED ORDER — IOHEXOL 300 MG/ML  SOLN
100.0000 mL | Freq: Once | INTRAMUSCULAR | Status: AC | PRN
  Administered 2020-08-03: 100 mL via INTRAVENOUS

## 2020-08-08 ENCOUNTER — Inpatient Hospital Stay (HOSPITAL_COMMUNITY): Payer: Medicare HMO | Attending: Hematology

## 2020-08-08 ENCOUNTER — Encounter (HOSPITAL_COMMUNITY): Payer: Self-pay | Admitting: Hematology

## 2020-08-08 ENCOUNTER — Other Ambulatory Visit: Payer: Self-pay

## 2020-08-08 ENCOUNTER — Inpatient Hospital Stay (HOSPITAL_BASED_OUTPATIENT_CLINIC_OR_DEPARTMENT_OTHER): Payer: Medicare HMO | Admitting: Hematology

## 2020-08-08 ENCOUNTER — Inpatient Hospital Stay (HOSPITAL_COMMUNITY): Payer: Medicare HMO

## 2020-08-08 VITALS — BP 146/68 | HR 90 | Temp 97.0°F | Resp 18 | Wt 171.6 lb

## 2020-08-08 DIAGNOSIS — Z79899 Other long term (current) drug therapy: Secondary | ICD-10-CM | POA: Insufficient documentation

## 2020-08-08 DIAGNOSIS — K7460 Unspecified cirrhosis of liver: Secondary | ICD-10-CM | POA: Insufficient documentation

## 2020-08-08 DIAGNOSIS — N189 Chronic kidney disease, unspecified: Secondary | ICD-10-CM | POA: Insufficient documentation

## 2020-08-08 DIAGNOSIS — R7989 Other specified abnormal findings of blood chemistry: Secondary | ICD-10-CM | POA: Diagnosis not present

## 2020-08-08 DIAGNOSIS — C792 Secondary malignant neoplasm of skin: Secondary | ICD-10-CM | POA: Diagnosis not present

## 2020-08-08 DIAGNOSIS — C773 Secondary and unspecified malignant neoplasm of axilla and upper limb lymph nodes: Secondary | ICD-10-CM | POA: Insufficient documentation

## 2020-08-08 DIAGNOSIS — K219 Gastro-esophageal reflux disease without esophagitis: Secondary | ICD-10-CM | POA: Diagnosis not present

## 2020-08-08 DIAGNOSIS — C50411 Malignant neoplasm of upper-outer quadrant of right female breast: Secondary | ICD-10-CM

## 2020-08-08 DIAGNOSIS — R5383 Other fatigue: Secondary | ICD-10-CM | POA: Diagnosis not present

## 2020-08-08 DIAGNOSIS — E785 Hyperlipidemia, unspecified: Secondary | ICD-10-CM | POA: Diagnosis not present

## 2020-08-08 DIAGNOSIS — G620 Drug-induced polyneuropathy: Secondary | ICD-10-CM | POA: Insufficient documentation

## 2020-08-08 DIAGNOSIS — Z7982 Long term (current) use of aspirin: Secondary | ICD-10-CM | POA: Insufficient documentation

## 2020-08-08 DIAGNOSIS — I129 Hypertensive chronic kidney disease with stage 1 through stage 4 chronic kidney disease, or unspecified chronic kidney disease: Secondary | ICD-10-CM | POA: Diagnosis not present

## 2020-08-08 DIAGNOSIS — Z5111 Encounter for antineoplastic chemotherapy: Secondary | ICD-10-CM | POA: Diagnosis not present

## 2020-08-08 DIAGNOSIS — E876 Hypokalemia: Secondary | ICD-10-CM | POA: Diagnosis not present

## 2020-08-08 DIAGNOSIS — Z171 Estrogen receptor negative status [ER-]: Secondary | ICD-10-CM

## 2020-08-08 DIAGNOSIS — R140 Abdominal distension (gaseous): Secondary | ICD-10-CM | POA: Insufficient documentation

## 2020-08-08 DIAGNOSIS — R011 Cardiac murmur, unspecified: Secondary | ICD-10-CM | POA: Diagnosis not present

## 2020-08-08 DIAGNOSIS — R188 Other ascites: Secondary | ICD-10-CM | POA: Diagnosis not present

## 2020-08-08 DIAGNOSIS — R21 Rash and other nonspecific skin eruption: Secondary | ICD-10-CM | POA: Diagnosis not present

## 2020-08-08 DIAGNOSIS — D631 Anemia in chronic kidney disease: Secondary | ICD-10-CM | POA: Diagnosis not present

## 2020-08-08 LAB — CBC WITH DIFFERENTIAL/PLATELET
Abs Immature Granulocytes: 0.01 10*3/uL (ref 0.00–0.07)
Basophils Absolute: 0.1 10*3/uL (ref 0.0–0.1)
Basophils Relative: 1 %
Eosinophils Absolute: 0.4 10*3/uL (ref 0.0–0.5)
Eosinophils Relative: 6 %
HCT: 31 % — ABNORMAL LOW (ref 36.0–46.0)
Hemoglobin: 10.4 g/dL — ABNORMAL LOW (ref 12.0–15.0)
Immature Granulocytes: 0 %
Lymphocytes Relative: 41 %
Lymphs Abs: 3.1 10*3/uL (ref 0.7–4.0)
MCH: 34.3 pg — ABNORMAL HIGH (ref 26.0–34.0)
MCHC: 33.5 g/dL (ref 30.0–36.0)
MCV: 102.3 fL — ABNORMAL HIGH (ref 80.0–100.0)
Monocytes Absolute: 1 10*3/uL (ref 0.1–1.0)
Monocytes Relative: 13 %
Neutro Abs: 2.9 10*3/uL (ref 1.7–7.7)
Neutrophils Relative %: 39 %
Platelets: 206 10*3/uL (ref 150–400)
RBC: 3.03 MIL/uL — ABNORMAL LOW (ref 3.87–5.11)
RDW: 22.5 % — ABNORMAL HIGH (ref 11.5–15.5)
WBC: 7.4 10*3/uL (ref 4.0–10.5)
nRBC: 0 % (ref 0.0–0.2)

## 2020-08-08 LAB — COMPREHENSIVE METABOLIC PANEL
ALT: 29 U/L (ref 0–44)
AST: 53 U/L — ABNORMAL HIGH (ref 15–41)
Albumin: 2.9 g/dL — ABNORMAL LOW (ref 3.5–5.0)
Alkaline Phosphatase: 131 U/L — ABNORMAL HIGH (ref 38–126)
Anion gap: 10 (ref 5–15)
BUN: 18 mg/dL (ref 8–23)
CO2: 19 mmol/L — ABNORMAL LOW (ref 22–32)
Calcium: 8.7 mg/dL — ABNORMAL LOW (ref 8.9–10.3)
Chloride: 108 mmol/L (ref 98–111)
Creatinine, Ser: 1.09 mg/dL — ABNORMAL HIGH (ref 0.44–1.00)
GFR, Estimated: 45 mL/min — ABNORMAL LOW (ref >60)
Glucose, Bld: 101 mg/dL — ABNORMAL HIGH (ref 70–99)
Potassium: 3.4 mmol/L — ABNORMAL LOW (ref 3.5–5.1)
Sodium: 137 mmol/L (ref 135–145)
Total Bilirubin: 1.5 mg/dL — ABNORMAL HIGH (ref 0.3–1.2)
Total Protein: 6.2 g/dL — ABNORMAL LOW (ref 6.5–8.1)

## 2020-08-08 LAB — MAGNESIUM: Magnesium: 1.8 mg/dL (ref 1.7–2.4)

## 2020-08-08 MED ORDER — FUROSEMIDE 20 MG PO TABS: 20.0000 mg | ORAL_TABLET | Freq: Every day | ORAL | 3 refills | Status: DC

## 2020-08-08 NOTE — Patient Instructions (Signed)
[place] at [facility] Discharge Instructions  You were seen today by Dr. [name]. He went over your recent results and scans; the cancer is well-controlled, but there is now some fluid in your abdomen. You did not receive your treatment today. You will be prescribed Lasix to take 1 tablet in the morning to decrease the fluid in your abdomen. Dr. [name] will see you back in 2 weeks for labs and follow up.   Thank you for choosing [place] at [facility] to provide your oncology and hematology care.  To afford each patient quality time with our provider, please arrive at least 15 minutes before your scheduled appointment time.   If you have a lab appointment with the [facility] please come in thru the Main Entrance and check in at the main information desk  You need to re-schedule your appointment should you arrive 10 or more minutes late.  We strive to give you quality time with our providers, and arriving late affects you and other patients whose appointments are after yours.  Also, if you no show three or more times for appointments you may be dismissed from the clinic at the providers discretion.     Again, thank you for choosing [facility].  Our hope is that these requests will decrease the amount of time that you wait before being seen by our physicians.       _____________________________________________________________  Should you have questions after your visit to [facility], please contact our office at [phone] between the hours of 8:00 a.m. and 4:30 p.m.  Voicemails left after 4:00 p.m. will not be returned until the following business day.  For prescription refill requests, have your pharmacy contact our office and allow 72 hours.    Cancer Center Support Programs:   > Cancer Support Group  2nd Tuesday of the month 1pm-2pm, Journey Room

## 2020-08-08 NOTE — Patient Instructions (Addendum)
[facility] Discharge Instructions for Patients Receiving Chemotherapy   Beginning January 23rd 2017 lab work for the Cancer Center will be done in the  Main lab at [facility] on 1st floor. If you have a lab appointment with the Cancer Center please come in thru the  Main Entrance and check in at the main information desk   Today you received the following chemotherapy agents Enhertu  To help prevent nausea and vomiting after your treatment, we encourage you to take your nausea medication    If you develop nausea and vomiting, or diarrhea that is not controlled by your medication, call the clinic.  The clinic phone number is [phone]. Office hours are Monday-Friday 8:30am-5:00pm.  BELOW ARE SYMPTOMS THAT SHOULD BE REPORTED IMMEDIATELY:  *FEVER GREATER THAN 101.0 F  *CHILLS WITH OR WITHOUT FEVER  NAUSEA AND VOMITING THAT IS NOT CONTROLLED WITH YOUR NAUSEA MEDICATION  *UNUSUAL SHORTNESS OF BREATH  *UNUSUAL BRUISING OR BLEEDING  TENDERNESS IN MOUTH AND THROAT WITH OR WITHOUT PRESENCE OF ULCERS  *URINARY PROBLEMS  *BOWEL PROBLEMS  UNUSUAL RASH Items with * indicate a potential emergency and should be followed up as soon as possible. If you have an emergency after office hours please contact your primary care physician or go to the nearest emergency department.  Please call the clinic during office hours if you have any questions or concerns.   You may also contact the Patient Navigator at [phone] should you have any questions or need assistance in obtaining follow up care.      Resources For Cancer Patients and their Caregivers ? American Cancer Society: Can assist with transportation, wigs, general needs, runs Look Good Feel Better.        1-888-227-6333 ? Cancer Care: Provides financial assistance, online support groups, medication/co-pay assistance.  1-800-813-HOPE (4673) ? [facility] Assists [place] cancer  patients and their families through emotional , educational and financial support.  336-427-4357 ? [place] DSS Where to apply for food stamps, Medicaid and utility assistance. 336-342-1394 ? [facility]: Transportation to medical appointments. 336-347-2287 ? Social Security Administration: May apply for disability if have a Stage IV cancer. 336-342-7796 1-800-772-1213 ? [place] Aging, Disability and Transit Services: Assists with nutrition, care and transit needs. 336-349-2343          

## 2020-08-08 NOTE — Progress Notes (Signed)
[facility] [address] [place] [address]   CLINIC:  Medical Oncology/Hematology  PCP:  Adrienne Squibb, MD [address] [name] [place] [address] [phone]   REASON FOR VISIT:  Follow-up for HER-2 positive right breast cancer to skin  PRIOR THERAPY:  1. Herceptin and Taxol x 12 cycles from 07/31/2018 to 10/30/2018. 2. Kadcylax 17 cyclesfrom 02/15/2019 to 04/06/2020.  NGS Results: Not done  CURRENT THERAPY: Enhertu every 3 weeks  BRIEF ONCOLOGIC HISTORY:  Oncology History  Malignant neoplasm of upper-outer quadrant of right breast in female, estrogen receptor negative ([place])  07/03/2018 Initial Diagnosis   Palpable right breast mass for several months with skin discoloration, clinically skin trabecular thickening with nipple retraction measuring 7.2 x 4.3 x 4.2 cm, additional mass 4 o'clock position 2.6 cm, right axillary tail 1.2 cm right axillary lymph node 1.8 cm left breast irregular mass 1 cm, adjacent mass 0.9 cm together measuring 1.9 cm, no lymphadenopathy   07/03/2018 Pathology Results   Right breast biopsy: IDC grade 2, ER 0%, PR 0%, HER-2 positive, Ki-67 40%, lymph node biopsy negative, left breast biopsy complex sclerosing lesion    07/15/2018 Cancer Staging   Staging form: Breast, AJCC 8th Edition - Clinical: Stage IIB (cT3, cN0, cM0, G2, ER-, PR-, HER2+) - Signed by [name], MD on 07/15/2018   07/31/2018 - 10/30/2018 Chemotherapy   The patient had trastuzumab (HERCEPTIN) 336 mg in sodium chloride 0.9 % 250 mL chemo infusion, 4 mg/kg = 336 mg, Intravenous,  Once, 4 of 4 cycles Administration: 336 mg (07/31/2018), 168 mg (08/07/2018), 168 mg (08/28/2018), 168 mg (08/14/2018), 168 mg (08/21/2018), 168 mg (09/04/2018), 168 mg (09/11/2018), 168 mg (09/18/2018), 168 mg (10/02/2018), 168 mg (10/09/2018), 168 mg (10/16/2018), 168 mg (10/23/2018), 168 mg (10/30/2018) PACLitaxel (TAXOL) 78 mg in sodium chloride 0.9 % 250 mL chemo infusion (</=  1m/m2), 40 mg/m2 = 78 mg (50 % of original dose 80 mg/m2), Intravenous,  Once, 4 of 4 cycles Dose modification: 40 mg/m2 (50 % of original dose 80 mg/m2, Cycle 1, Reason: Patient Age), 53.3333 mg/m2 (66.7 % of original dose 80 mg/m2, Cycle 1, Reason: Provider Judgment), 45 mg/m2 (original dose 80 mg/m2, Cycle 3, Reason: Provider Judgment) Administration: 78 mg (07/31/2018), 102 mg (08/07/2018), 102 mg (08/28/2018), 102 mg (08/14/2018), 102 mg (08/21/2018), 102 mg (09/04/2018), 102 mg (09/18/2018), 84 mg (10/02/2018), 84 mg (10/09/2018), 84 mg (10/16/2018), 84 mg (10/23/2018), 84 mg (10/30/2018)  for chemotherapy treatment.    11/13/2018 - 01/25/2019 Chemotherapy   The patient had trastuzumab (HERCEPTIN) 504 mg in sodium chloride 0.9 % 250 mL chemo infusion, 6 mg/kg = 504 mg (100 % of original dose 6 mg/kg), Intravenous,  Once, 4 of 5 cycles Dose modification: 6 mg/kg (original dose 6 mg/kg, Cycle 1, Reason: Other (see comments), Comment: pt receving it ) Administration: 504 mg (11/13/2018), 504 mg (12/04/2018), 504 mg (01/04/2019), 504 mg (01/25/2019)  for chemotherapy treatment.    02/15/2019 - 04/06/2020 Chemotherapy   The patient had ado-trastuzumab emtansine (KADCYLA) 240 mg in sodium chloride 0.9 % 250 mL chemo infusion, 3 mg/kg = 240 mg (100 % of original dose 3 mg/kg), Intravenous, Once, 17 of 18 cycles Dose modification: 3 mg/kg (original dose 3 mg/kg, Cycle 1, Reason: Provider Judgment), 3 mg/kg (original dose 3 mg/kg, Cycle 2, Reason: Provider Judgment), 2.4 mg/kg (original dose 3 mg/kg, Cycle 11, Reason: Other (see comments), Comment: bili upper limit of normal), 2.4 mg/kg (original dose 3 mg/kg, Cycle  12, Reason: Other (see comments), Comment: elevated bilirubin) Administration: 240 mg (02/15/2019), 240 mg (03/08/2019), 240 mg (05/10/2019), 240 mg (03/29/2019), 240 mg (04/19/2019), 240 mg (06/01/2019), 240 mg (06/22/2019), 240 mg (07/20/2019), 240 mg (08/10/2019), 240 mg (09/01/2019), 200 mg (09/22/2019), 180 mg  (11/03/2019), 180 mg (12/09/2019), 180 mg (01/06/2020), 180 mg (02/03/2020), 180 mg (03/09/2020), 180 mg (04/06/2020)  for chemotherapy treatment.    05/15/2020 -  Chemotherapy   The patient had dexamethasone (DECADRON) 4 MG tablet, 8 mg, Oral, Daily, 1 of 1 cycle, Start date: --, End date: -- palonosetron (ALOXI) injection 0.25 mg, 0.25 mg, Intravenous,  Once, 4 of 6 cycles Administration: 0.25 mg (05/15/2020), 0.25 mg (06/05/2020), 0.25 mg (06/26/2020), 0.25 mg (07/17/2020) fam-trastuzumab deruxtecan-nxki (ENHERTU) 226 mg in dextrose 5 % 100 mL chemo infusion, 3.2 mg/kg = 226 mg (59.3 % of original dose 5.4 mg/kg), Intravenous,  Once, 4 of 6 cycles Dose modification: 3.2 mg/kg (original dose 5.4 mg/kg, Cycle 1, Reason: Provider Judgment), 4.4 mg/kg (original dose 5.4 mg/kg, Cycle 2, Reason: Provider Judgment), 4.4 mg/kg (original dose 5.4 mg/kg, Cycle 5, Reason: Provider Judgment), 4.4 mg/kg (original dose 5.4 mg/kg, Cycle 3, Reason: Provider Judgment) Administration: 226 mg (05/15/2020), 312 mg (06/05/2020), 312 mg (06/26/2020), 312 mg (07/17/2020)  for chemotherapy treatment.      CANCER STAGING: Cancer Staging Malignant neoplasm of upper-outer quadrant of right breast in female, estrogen receptor negative ([place]) Staging form: Breast, AJCC 8th Edition - Clinical: Stage IIB (cT3, cN0, cM0, G2, ER-, PR-, HER2+) - Signed by [name], MD on 07/15/2018   INTERVAL HISTORY:  [name], a 86 y.o. female, returns for routine follow-up and consideration for next cycle of chemotherapy. [name] was last seen on 07/17/2020.  Due for cycle #5 of Enhertu today.   Today she is accompanied by her husband. Overall, she tells me she has been feeling pretty well. Her abdomen has become noticeably distended over the past several days. She denies having any SOB or orthopnea. Her appetite is great. She denies having any leg swelling.   REVIEW OF SYSTEMS:  Review of Systems  Constitutional: Positive for fatigue  (70%). Negative for appetite change.  Respiratory: Negative for shortness of breath.   Cardiovascular: Negative for leg swelling.  Gastrointestinal: Positive for abdominal distention.  Neurological: Positive for numbness (& burning in feet).  All other systems reviewed and are negative.   PAST MEDICAL/SURGICAL HISTORY:  Past Medical History:  Diagnosis Date  . Cancer ([place]) 06/2018   right breast cancer  . GERD (gastroesophageal reflux disease)    occasional   . Hyperlipidemia   . Hypertension    clearance with note Dr Nevada Crane on chart  . Pneumonia    2 years ago/ states occ cough with sinus drainage- no fever  . Thyroid nodule    with biopsy- states following medically   Past Surgical History:  Procedure Laterality Date  . CATARACT EXTRACTION W/PHACO  11/30/2012   Procedure: CATARACT EXTRACTION PHACO AND INTRAOCULAR LENS PLACEMENT (IOC);  Surgeon: Williams Che, MD;  Location: [facility];  Service: Ophthalmology;  Laterality: Left;  CDE:11.49  . CATARACT EXTRACTION W/PHACO Right 03/15/2013   Procedure: CATARACT EXTRACTION PHACO AND INTRAOCULAR LENS PLACEMENT (IOC);  Surgeon: Williams Che, MD;  Location: [facility];  Service: Ophthalmology;  Laterality: Right;  CDE 16.15  . COLONOSCOPY    . EXCISION OF SKIN TAG Right 06/17/2016   Procedure: SHAVE OF SKIN TAG RIGHT BUTTOCK;  Surgeon: Aviva Signs, MD;  Location: [facility];  Service: General;  Laterality: Right;  . JOINT REPLACEMENT     left knee  . MASS EXCISION Left 06/17/2016   Procedure: EXCISION SKIN MALIGNANT LESION LEFT BUTTOCK;  Surgeon: Aviva Signs, MD;  Location: [facility];  Service: General;  Laterality: Left;  . PORTACATH PLACEMENT Right 07/28/2018   Procedure: INSERTION PORT-A-CATH WITH ULTRASOUND;  Surgeon: Rolm Bookbinder, MD;  Location: [facility];  Service: General;  Laterality: Right;  . PUNCH BIOPSY OF SKIN Right 07/28/2018   Procedure: PUNCH BIOPSY OF SKIN RIGHT BREAST;  Surgeon: Rolm Bookbinder, MD;   Location: [facility];  Service: General;  Laterality: Right;  . TOTAL KNEE ARTHROPLASTY  12/16/2011   Procedure: TOTAL KNEE ARTHROPLASTY;  Surgeon: Gearlean Alf, MD;  Location: [facility];  Service: Orthopedics;  Laterality: Right;  . TUBAL LIGATION      SOCIAL HISTORY:  Social History   Socioeconomic History  . Marital status: Married    Spouse name: Not on file  . Number of children: 2  . Years of education: some business school after HS  . Highest education level: Not on file  Occupational History  . Occupation: Cabin crew  . Occupation: Network engineer   Tobacco Use  . Smoking status: Never Smoker  . Smokeless tobacco: Never Used  Vaping Use  . Vaping Use: Never used  Substance and Sexual Activity  . Alcohol use: No  . Drug use: No  . Sexual activity: Yes    Birth control/protection: None  Other Topics Concern  . Not on file  Social History Narrative   Lives at home with her husband.   4 cups caffeine per day.   Right-handed.   Social Determinants of Health   Financial Resource Strain:   . Difficulty of Paying Living Expenses: Not on file  Food Insecurity:   . Worried About Charity fundraiser in the Last Year: Not on file  . Ran Out of Food in the Last Year: Not on file  Transportation Needs:   . Lack of Transportation (Medical): Not on file  . Lack of Transportation (Non-Medical): Not on file  Physical Activity:   . Days of Exercise per Week: Not on file  . Minutes of Exercise per Session: Not on file  Stress:   . Feeling of Stress : Not on file  Social Connections:   . Frequency of Communication with Friends and Family: Not on file  . Frequency of Social Gatherings with Friends and Family: Not on file  . Attends Religious Services: Not on file  . Active Member of Clubs or Organizations: Not on file  . Attends Archivist Meetings: Not on file  . Marital Status: Not on file  Intimate Partner Violence:   . Fear of Current or Ex-Partner: Not  on file  . Emotionally Abused: Not on file  . Physically Abused: Not on file  . Sexually Abused: Not on file    FAMILY HISTORY:  Family History  Problem Relation Age of Onset  . Heart attack Mother   . Bronchitis Father   . Diabetes Sister   . Leukemia Brother   . Lung disease Brother   . Lung disease Sister     CURRENT MEDICATIONS:  Current Outpatient Medications  Medication Sig Dispense Refill  . acetaminophen (TYLENOL) 325 MG tablet Take 650 mg by mouth every 6 (six) hours as needed.     Marland Kitchen aspirin 325 MG tablet Take 162.5 mg by mouth daily.     Marland Kitchen  cetirizine (ZYRTEC) 10 MG tablet Take 10 mg by mouth daily.    . hydrocortisone 1 % ointment Apply 1 application topically 2 (two) times daily. 60 g 0  . Misc. Devices MISC Please provide lymphedema sleeve for right arm 2 each 0  . Multiple Vitamin (MULTIVITAMIN WITH MINERALS) TABS tablet Take 1 tablet by mouth daily.    Glory Rosebush ULTRA test strip USE 1 STRIP TO CHECK GLUCOSE 2 TO 3 TIMES DAILY    . furosemide (LASIX) 20 MG tablet Take 1 tablet (20 mg total) by mouth daily. 30 tablet 3  . lidocaine-prilocaine (EMLA) cream Apply to port site one hour prior to appointment and cover with plastic wrap. (Patient not taking: Reported on 08/08/2020) 30 g 3   No current facility-administered medications for this visit.   Facility-Administered Medications Ordered in Other Visits  Medication Dose Route Frequency Provider Last Rate Last Admin  . sodium chloride flush (NS) 0.9 % injection 10 mL  10 mL Intravenous PRN Derek Jack, MD   10 mL at 05/23/20 1025  . sodium chloride flush (NS) 0.9 % injection 10 mL  10 mL Intravenous PRN Derek Jack, MD   10 mL at 05/23/20 0900    ALLERGIES:  Allergies  Allergen Reactions  . Penicillins Rash    Has patient had a PCN reaction causing immediate rash, facial/tongue/throat swelling, SOB or lightheadedness with hypotension: No Has patient had a PCN reaction causing severe rash  involving mucus membranes or skin necrosis: No Has patient had a PCN reaction that required hospitalization: No Has patient had a PCN reaction occurring within the last 10 years: No If all of the above answers are "NO", then may proceed with Cephalosporin use.     PHYSICAL EXAM:  Performance status (ECOG): 1 - Symptomatic but completely ambulatory  Vitals:   08/08/20 1207  BP: (!) 146/68  Pulse: 90  Resp: 18  Temp: (!) 97 F (36.1 C)  SpO2: 100%   Wt Readings from Last 3 Encounters:  08/08/20 171 lb 9.6 oz (77.8 kg)  07/17/20 170 lb (77.1 kg)  06/26/20 164 lb 11.2 oz (74.7 kg)   Physical Exam Vitals reviewed.  Constitutional:      Appearance: Normal appearance.  Cardiovascular:     Rate and Rhythm: Normal rate and regular rhythm.     Pulses: Normal pulses.     Heart sounds: Normal heart sounds.  Pulmonary:     Effort: Pulmonary effort is normal.     Breath sounds: Normal breath sounds.  Chest:     Comments: Port-a-Cath in R chest Abdominal:     General: There is distension.     Palpations: Abdomen is soft.     Tenderness: There is no abdominal tenderness.  Musculoskeletal:     Right lower leg: No edema.     Left lower leg: No edema.  Neurological:     General: No focal deficit present.     Mental Status: She is alert and oriented to person, place, and time.  Psychiatric:        Mood and Affect: Mood normal.        Behavior: Behavior normal.     LABORATORY DATA:  I have reviewed the labs as listed.  CBC Latest Ref Rng & Units 08/08/2020 07/17/2020 06/26/2020  WBC 4.0 - 10.5 K/uL 7.4 8.3 7.4  Hemoglobin 12.0 - 15.0 g/dL 10.4(L) 9.5(L) 7.8(L)  Hematocrit 36 - 46 % 31.0(L) 29.0(L) 23.9(L)  Platelets 150 - 400 K/uL 206 201  210   CMP Latest Ref Rng & Units 08/08/2020 07/17/2020 06/26/2020  Glucose 70 - 99 mg/dL 101(H) 93 122(H)  BUN 8 - 23 mg/dL 18 21 26(H)  Creatinine 0.44 - 1.00 mg/dL 1.09(H) 1.04(H) 1.19(H)  Sodium 135 - 145 mmol/L 137 137 138  Potassium 3.5  - 5.1 mmol/L 3.4(L) 3.7 4.0  Chloride 98 - 111 mmol/L 108 108 109  CO2 22 - 32 mmol/L 19(L) 20(L) 19(L)  Calcium 8.9 - 10.3 mg/dL 8.7(L) 8.7(L) 9.4  Total Protein 6.5 - 8.1 g/dL 6.2(L) 6.2(L) 5.9(L)  Total Bilirubin 0.3 - 1.2 mg/dL 1.5(H) 1.2 1.1  Alkaline Phos 38 - 126 U/L 131(H) 138(H) 120  AST 15 - 41 U/L 53(H) 51(H) 52(H)  ALT 0 - 44 U/L _0 DIAGNOSTIC IMAGING:  I have independently reviewed the scans and discussed with the patient. CT CHEST ABDOMEN PELVIS W CONTRAST  Result Date: 08/03/2020 CLINICAL DATA:  Breast cancer, assess treatment response EXAM: CT CHEST, ABDOMEN, AND PELVIS WITH CONTRAST TECHNIQUE: Multidetector CT imaging of the chest, abdomen and pelvis was performed following the standard protocol during bolus administration of intravenous contrast. CONTRAST:  131m OMNIPAQUE IOHEXOL 300 MG/ML SOLN, additional oral enteric contrast COMPARISON:  04/26/2020 FINDINGS: CT CHEST FINDINGS Cardiovascular: Right chest port catheter. Aortic atherosclerosis. Aortic valve calcifications. Normal heart size. Three-vessel coronary artery calcifications. No pericardial effusion. Mediastinum/Nodes: No enlarged mediastinal, hilar, or axillary lymph nodes. Small hiatal hernia. Thyroid gland, trachea, and esophagus demonstrate no significant findings. Lungs/Pleura: Minimal scarring and or atelectasis of the lung bases. No pleural effusion or pneumothorax. Musculoskeletal: Skin thickening of the right breast. There is dense glandular tissue in the central right breast, a lobulated contrast enhancing mass identified in the central right breast on prior examination is not clearly appreciated (series 10, image 80). No suspicious bone lesions identified. CT ABDOMEN PELVIS FINDINGS Hepatobiliary: Cirrhotic morphology of the liver. No gallstones, gallbladder wall thickening, or biliary dilatation. Pancreas: Unremarkable. No pancreatic ductal dilatation or surrounding inflammatory changes. Spleen: Normal  in size without significant abnormality. Adrenals/Urinary Tract: Adrenal glands are unremarkable. Kidneys are normal, without renal calculi, solid lesion, or hydronephrosis. Bladder is unremarkable. Stomach/Bowel: Stomach is within normal limits. Incidental duodenal diverticulum. Appendix is not clearly visualized and may be surgically absent. Wall thickening of the cecal base (series 5, image 39). Sigmoid diverticulosis. Vascular/Lymphatic: No significant vascular findings are present. No enlarged abdominal or pelvic lymph nodes. Reproductive: No mass or other abnormality. Other: No abdominal wall hernia or abnormality. Moderate volume ascites throughout the abdomen and pelvis. Musculoskeletal: No acute or significant osseous findings. IMPRESSION: 1. Skin thickening of the right breast with dense glandular tissue in the central right breast, a lobulated contrast enhancing mass identified in the central right breast on prior examination is not clearly appreciated. Findings are most consistent with treatment response of primary breast malignancy; appearance of mass may be very sensitive to exact phase of contrast. 2. No evidence of lymphadenopathy or metastatic disease within the chest, abdomen, or pelvis. 3. Cirrhotic morphology of the liver. Moderate volume ascites throughout the abdomen and pelvis, new compared to prior examination. 4. Wall thickening of the cecal base. This most likely reflects portal colopathy in the presence of ascites and in the absence of acutely referable signs and symptoms. 5. Coronary artery disease.  Aortic Atherosclerosis (ICD10-I70.0). Electronically Signed   By: AEddie CandleM.D.   On: 08/03/2020 14:35     ASSESSMENT:  1. Metastatic right breast cancer to the skin,  ER/PR negative, HER-2 positive: -Presentation with skin involvement of the right breast, axillary region and posterior chest wall. -12 cycles of weekly Taxol and Herceptin from 07/31/2018 through 10/30/2018 with PET scan  showing progression. -Kadcyla from 02/15/2019 through 04/06/2020 with progression. -CT CAP on 04/26/2020 shows right breast mass measuring 3.7 x 2.8 (2.7 x 2.3). CC dimension measures 4.3 cm (3.9 cm). No new areas of metastatic disease. No skeletal metastasis. -Enhertustarted on 05/15/2020 at 3.4 mg/kg. -CT CAP from 08/03/2020 shows no evidence of lymphadenopathy or metastatic disease in the chest, abdomen or pelvis.  Cirrhotic morphology of the liver with moderate volume ascites throughout the abdomen and pelvis.  Wall thickening of the cecal base.  2. High risk drug monitoring: -2D echo on 04/03/2020 shows EF 65 to 70%.   PLAN:  1. Metastatic right breast cancer to the skin, ER/PR negative, HER-2 positive: -We reviewed results of the CT scan with the patient and her husband in detail. -Labs today shows elevated AST of 53 and total bilirubin of 1.5. -I have also discussed new development of cirrhosis and ascites.  I do not know the etiology.  Likely her treatment might have precipitated cirrhosis from underlying liver disease like fatty liver. -I would hold her treatment today.  Reevaluate in 2 weeks.  2. High risk drug monitoring: -Continue periodic echocardiogram for monitoring of ejection fraction.  3. Hypokalemia: -Potassium today is 3.4.  No supplements needed.  4. Skin rash: -Continue steroid cream on lower back for itching.  5. Elevated LFTs: -AST is 53 and total bilirubin is 1.5.  We will closely monitor.  6. CKD: -Creatinine is 1.09.  Continue to avoid nephrotoxins.  7.  Macrocytic anemia: -Anemia from iron deficiency and CKD. -Received parenteral iron therapy with Venofer in the past.  Hemoglobin today stable at 10.4.  8.  New onset cirrhosis and ascites: -We reviewed results and images of the CT scan which showed new onset cirrhosis and moderate ascites. -She has moderate distention of the abdomen without any symptoms. -I have recommended starting  diuretic with Lasix 20 mg daily.  I will reevaluate her in 2 weeks.   Orders placed this encounter:  No orders of the defined types were placed in this encounter.    Derek Jack, MD [place] [phone]   I, Milinda Antis, am acting as a scribe for Dr. Sanda Linger.  I, Derek Jack MD, have reviewed the above documentation for accuracy and completeness, and I agree with the above.

## 2020-08-08 NOTE — Progress Notes (Signed)
Hold tx today per Dr. [name] d/t elevated bilirubin (1.5) and recent finding of cirrhosis of liver on scans.  Pt is to start Lasix 20 mg daily per MD and RTC in 2 weeks for reevaluation.

## 2020-08-08 NOTE — Progress Notes (Signed)
We are holding her treatment today.  She will start Lasix once daily and will come back to see physician in 2 weeks.

## 2020-08-24 ENCOUNTER — Inpatient Hospital Stay (HOSPITAL_BASED_OUTPATIENT_CLINIC_OR_DEPARTMENT_OTHER): Payer: Medicare HMO | Admitting: Hematology

## 2020-08-24 ENCOUNTER — Inpatient Hospital Stay (HOSPITAL_COMMUNITY): Payer: Medicare HMO

## 2020-08-24 ENCOUNTER — Other Ambulatory Visit: Payer: Self-pay

## 2020-08-24 VITALS — BP 152/72 | HR 87 | Temp 97.0°F | Resp 18

## 2020-08-24 VITALS — BP 155/75 | HR 95 | Temp 96.8°F | Resp 20 | Wt 161.6 lb

## 2020-08-24 DIAGNOSIS — Z171 Estrogen receptor negative status [ER-]: Secondary | ICD-10-CM

## 2020-08-24 DIAGNOSIS — C50411 Malignant neoplasm of upper-outer quadrant of right female breast: Secondary | ICD-10-CM

## 2020-08-24 DIAGNOSIS — Z5111 Encounter for antineoplastic chemotherapy: Secondary | ICD-10-CM | POA: Diagnosis not present

## 2020-08-24 LAB — CBC WITH DIFFERENTIAL/PLATELET
Abs Immature Granulocytes: 0.01 10*3/uL (ref 0.00–0.07)
Basophils Absolute: 0.1 10*3/uL (ref 0.0–0.1)
Basophils Relative: 1 %
Eosinophils Absolute: 0.6 10*3/uL — ABNORMAL HIGH (ref 0.0–0.5)
Eosinophils Relative: 7 %
HCT: 34.1 % — ABNORMAL LOW (ref 36.0–46.0)
Hemoglobin: 11.7 g/dL — ABNORMAL LOW (ref 12.0–15.0)
Immature Granulocytes: 0 %
Lymphocytes Relative: 37 %
Lymphs Abs: 3.1 10*3/uL (ref 0.7–4.0)
MCH: 34.4 pg — ABNORMAL HIGH (ref 26.0–34.0)
MCHC: 34.3 g/dL (ref 30.0–36.0)
MCV: 100.3 fL — ABNORMAL HIGH (ref 80.0–100.0)
Monocytes Absolute: 1 10*3/uL (ref 0.1–1.0)
Monocytes Relative: 12 %
Neutro Abs: 3.6 10*3/uL (ref 1.7–7.7)
Neutrophils Relative %: 43 %
Platelets: 217 10*3/uL (ref 150–400)
RBC: 3.4 MIL/uL — ABNORMAL LOW (ref 3.87–5.11)
RDW: 18.3 % — ABNORMAL HIGH (ref 11.5–15.5)
WBC: 8.5 10*3/uL (ref 4.0–10.5)
nRBC: 0 % (ref 0.0–0.2)

## 2020-08-24 LAB — MAGNESIUM: Magnesium: 1.9 mg/dL (ref 1.7–2.4)

## 2020-08-24 LAB — COMPREHENSIVE METABOLIC PANEL
ALT: 26 U/L (ref 0–44)
AST: 53 U/L — ABNORMAL HIGH (ref 15–41)
Albumin: 3.1 g/dL — ABNORMAL LOW (ref 3.5–5.0)
Alkaline Phosphatase: 141 U/L — ABNORMAL HIGH (ref 38–126)
Anion gap: 11 (ref 5–15)
BUN: 25 mg/dL — ABNORMAL HIGH (ref 8–23)
CO2: 20 mmol/L — ABNORMAL LOW (ref 22–32)
Calcium: 9.2 mg/dL (ref 8.9–10.3)
Chloride: 105 mmol/L (ref 98–111)
Creatinine, Ser: 1.11 mg/dL — ABNORMAL HIGH (ref 0.44–1.00)
GFR, Estimated: 48 mL/min — ABNORMAL LOW (ref >60)
Glucose, Bld: 103 mg/dL — ABNORMAL HIGH (ref 70–99)
Potassium: 3.3 mmol/L — ABNORMAL LOW (ref 3.5–5.1)
Sodium: 136 mmol/L (ref 135–145)
Total Bilirubin: 1.2 mg/dL (ref 0.3–1.2)
Total Protein: 6.8 g/dL (ref 6.5–8.1)

## 2020-08-24 MED ORDER — DEXTROSE 5 % IV SOLN: Freq: Once | INTRAVENOUS | Status: AC

## 2020-08-24 MED ORDER — FAM-TRASTUZUMAB DERUXTECAN-NXKI CHEMO 100 MG IV SOLR
200.0000 mg | Freq: Once | INTRAVENOUS | Status: AC
  Administered 2020-08-24: 200 mg via INTRAVENOUS
  Filled 2020-08-24: qty 10

## 2020-08-24 MED ORDER — ACETAMINOPHEN 325 MG PO TABS
650.0000 mg | ORAL_TABLET | Freq: Once | ORAL | Status: AC
  Administered 2020-08-24: 650 mg via ORAL
  Filled 2020-08-24: qty 2

## 2020-08-24 MED ORDER — PALONOSETRON HCL INJECTION 0.25 MG/5ML
0.2500 mg | Freq: Once | INTRAVENOUS | Status: AC
  Administered 2020-08-24: 0.25 mg via INTRAVENOUS
  Filled 2020-08-24: qty 5

## 2020-08-24 MED ORDER — SODIUM CHLORIDE 0.9 % IV SOLN
10.0000 mg | Freq: Once | INTRAVENOUS | Status: AC
  Administered 2020-08-24: 10 mg via INTRAVENOUS
  Filled 2020-08-24: qty 10

## 2020-08-24 MED ORDER — DIPHENHYDRAMINE HCL 25 MG PO CAPS
50.0000 mg | ORAL_CAPSULE | Freq: Once | ORAL | Status: AC
  Administered 2020-08-24: 50 mg via ORAL
  Filled 2020-08-24: qty 2

## 2020-08-24 MED ORDER — HEPARIN SOD (PORK) LOCK FLUSH 100 UNIT/ML IV SOLN
500.0000 [IU] | Freq: Once | INTRAVENOUS | Status: AC | PRN
  Administered 2020-08-24: 500 [IU]

## 2020-08-24 MED ORDER — FAM-TRASTUZUMAB DERUXTECAN-NXKI CHEMO 100 MG IV SOLR
3.2000 mg/kg | Freq: Once | INTRAVENOUS | Status: DC
  Filled 2020-08-24: qty 11.3

## 2020-08-24 MED ORDER — SODIUM CHLORIDE 0.9% FLUSH
10.0000 mL | INTRAVENOUS | Status: DC | PRN
  Administered 2020-08-24: 10 mL

## 2020-08-24 NOTE — Progress Notes (Signed)
Pt here for enhertu. K 3.3, BUN 25, Creatinine 25, Creatinine 1.11, AST 53, Alkaline phosphatase 141.  Labs reviewed with Dr Raliegh Ip, okay for treatment.   Tolerated treatment well today without incidence.  Discharged in stable condition ambulatory.  Vital signs stable prior to discharge.

## 2020-08-24 NOTE — Patient Instructions (Signed)
[place] at [facility] Discharge Instructions  You were seen today by Dr. [name]. He went over your recent results. You received your treatment today. Start taking the furosemide on Monday, Wednesday and Friday. Dr. [name] will see you back in 3 weeks for labs and follow up.   Thank you for choosing [place] at [facility] to provide your oncology and hematology care.  To afford each patient quality time with our provider, please arrive at least 15 minutes before your scheduled appointment time.   If you have a lab appointment with the [facility] please come in thru the Main Entrance and check in at the main information desk  You need to re-schedule your appointment should you arrive 10 or more minutes late.  We strive to give you quality time with our providers, and arriving late affects you and other patients whose appointments are after yours.  Also, if you no show three or more times for appointments you may be dismissed from the clinic at the providers discretion.     Again, thank you for choosing [facility].  Our hope is that these requests will decrease the amount of time that you wait before being seen by our physicians.       _____________________________________________________________  Should you have questions after your visit to [facility], please contact our office at [phone] between the hours of 8:00 a.m. and 4:30 p.m.  Voicemails left after 4:00 p.m. will not be returned until the following business day.  For prescription refill requests, have your pharmacy contact our office and allow 72 hours.    Cancer Center Support Programs:   > Cancer Support Group  2nd Tuesday of the month 1pm-2pm, Journey Room

## 2020-08-24 NOTE — Patient Instructions (Signed)
[facility] Discharge Instructions for Patients Receiving Chemotherapy  Today you received the following chemotherapy agents   To help prevent nausea and vomiting after your treatment, we encourage you to take your nausea medication   If you develop nausea and vomiting that is not controlled by your nausea medication, call the clinic.   BELOW ARE SYMPTOMS THAT SHOULD BE REPORTED IMMEDIATELY:  *FEVER GREATER THAN 100.5 F  *CHILLS WITH OR WITHOUT FEVER  NAUSEA AND VOMITING THAT IS NOT CONTROLLED WITH YOUR NAUSEA MEDICATION  *UNUSUAL SHORTNESS OF BREATH  *UNUSUAL BRUISING OR BLEEDING  TENDERNESS IN MOUTH AND THROAT WITH OR WITHOUT PRESENCE OF ULCERS  *URINARY PROBLEMS  *BOWEL PROBLEMS  UNUSUAL RASH Items with * indicate a potential emergency and should be followed up as soon as possible.  Feel free to call the clinic should you have any questions or concerns. The clinic phone number is [phone].  Please show the CHEMO ALERT CARD at check-in to the Emergency Department and triage nurse.   

## 2020-08-24 NOTE — Progress Notes (Signed)
[place] [address] [place] [address]   CLINIC:  Medical Oncology/Hematology  PCP:  [name], MD [address] [name] [address] [phone]   REASON FOR VISIT:  Follow-up for HER-2 positive right breast cancer to skin  PRIOR THERAPY:  1. Herceptin and Taxol x 12 cycles from 07/31/2018 to 10/30/2018. 2. Kadcylax 17 cyclesfrom 02/15/2019 to 04/06/2020.  NGS Results: Not done  CURRENT THERAPY: Enhertu every 3 weeks  BRIEF ONCOLOGIC HISTORY:  Oncology History  Malignant neoplasm of upper-outer quadrant of right breast in female, estrogen receptor negative ([place])  07/03/2018 Initial Diagnosis   Palpable right breast mass for several months with skin discoloration, clinically skin trabecular thickening with nipple retraction measuring 7.2 x 4.3 x 4.2 cm, additional mass 4 o'clock position 2.6 cm, right axillary tail 1.2 cm right axillary lymph node 1.8 cm left breast irregular mass 1 cm, adjacent mass 0.9 cm together measuring 1.9 cm, no lymphadenopathy   07/03/2018 Pathology Results   Right breast biopsy: IDC grade 2, ER 0%, PR 0%, HER-2 positive, Ki-67 40%, lymph node biopsy negative, left breast biopsy complex sclerosing lesion    07/15/2018 Cancer Staging   Staging form: Breast, AJCC 8th Edition - Clinical: Stage IIB (cT3, cN0, cM0, G2, ER-, PR-, HER2+) - Signed by [name], MD on 07/15/2018   07/31/2018 - 10/30/2018 Chemotherapy   The patient had trastuzumab (HERCEPTIN) 336 mg in sodium chloride 0.9 % 250 mL chemo infusion, 4 mg/kg = 336 mg, Intravenous,  Once, 4 of 4 cycles Administration: 336 mg (07/31/2018), 168 mg (08/07/2018), 168 mg (08/28/2018), 168 mg (08/14/2018), 168 mg (08/21/2018), 168 mg (09/04/2018), 168 mg (09/11/2018), 168 mg (09/18/2018), 168 mg (10/02/2018), 168 mg (10/09/2018), 168 mg (10/16/2018), 168 mg (10/23/2018), 168 mg (10/30/2018) PACLitaxel (TAXOL) 78 mg in sodium chloride 0.9 % 250 mL chemo infusion (</=  1m/m2), 40 mg/m2 = 78 mg (50 % of original dose 80 mg/m2), Intravenous,  Once, 4 of 4 cycles Dose modification: 40 mg/m2 (50 % of original dose 80 mg/m2, Cycle 1, Reason: Patient Age), 53.3333 mg/m2 (66.7 % of original dose 80 mg/m2, Cycle 1, Reason: Provider Judgment), 45 mg/m2 (original dose 80 mg/m2, Cycle 3, Reason: Provider Judgment) Administration: 78 mg (07/31/2018), 102 mg (08/07/2018), 102 mg (08/28/2018), 102 mg (08/14/2018), 102 mg (08/21/2018), 102 mg (09/04/2018), 102 mg (09/18/2018), 84 mg (10/02/2018), 84 mg (10/09/2018), 84 mg (10/16/2018), 84 mg (10/23/2018), 84 mg (10/30/2018)  for chemotherapy treatment.    11/13/2018 - 01/25/2019 Chemotherapy   The patient had trastuzumab (HERCEPTIN) 504 mg in sodium chloride 0.9 % 250 mL chemo infusion, 6 mg/kg = 504 mg (100 % of original dose 6 mg/kg), Intravenous,  Once, 4 of 5 cycles Dose modification: 6 mg/kg (original dose 6 mg/kg, Cycle 1, Reason: Other (see comments), Comment: pt receving it ) Administration: 504 mg (11/13/2018), 504 mg (12/04/2018), 504 mg (01/04/2019), 504 mg (01/25/2019)  for chemotherapy treatment.    02/15/2019 - 04/06/2020 Chemotherapy   The patient had ado-trastuzumab emtansine (KADCYLA) 240 mg in sodium chloride 0.9 % 250 mL chemo infusion, 3 mg/kg = 240 mg (100 % of original dose 3 mg/kg), Intravenous, Once, 17 of 18 cycles Dose modification: 3 mg/kg (original dose 3 mg/kg, Cycle 1, Reason: Provider Judgment), 3 mg/kg (original dose 3 mg/kg, Cycle 2, Reason: Provider Judgment), 2.4 mg/kg (original dose 3 mg/kg, Cycle 11, Reason: Other (see comments), Comment: bili upper limit of normal), 2.4 mg/kg (original dose 3 mg/kg, Cycle  12, Reason: Other (see comments), Comment: elevated bilirubin) Administration: 240 mg (02/15/2019), 240 mg (03/08/2019), 240 mg (05/10/2019), 240 mg (03/29/2019), 240 mg (04/19/2019), 240 mg (06/01/2019), 240 mg (06/22/2019), 240 mg (07/20/2019), 240 mg (08/10/2019), 240 mg (09/01/2019), 200 mg (09/22/2019), 180 mg  (11/03/2019), 180 mg (12/09/2019), 180 mg (01/06/2020), 180 mg (02/03/2020), 180 mg (03/09/2020), 180 mg (04/06/2020)  for chemotherapy treatment.    05/15/2020 -  Chemotherapy   The patient had dexamethasone (DECADRON) 4 MG tablet, 8 mg, Oral, Daily, 1 of 1 cycle, Start date: --, End date: -- palonosetron (ALOXI) injection 0.25 mg, 0.25 mg, Intravenous,  Once, 4 of 6 cycles Administration: 0.25 mg (05/15/2020), 0.25 mg (06/05/2020), 0.25 mg (06/26/2020), 0.25 mg (07/17/2020) fam-trastuzumab deruxtecan-nxki (ENHERTU) 226 mg in dextrose 5 % 100 mL chemo infusion, 3.2 mg/kg = 226 mg (59.3 % of original dose 5.4 mg/kg), Intravenous,  Once, 4 of 6 cycles Dose modification: 3.2 mg/kg (original dose 5.4 mg/kg, Cycle 1, Reason: Provider Judgment), 4.4 mg/kg (original dose 5.4 mg/kg, Cycle 2, Reason: Provider Judgment), 4.4 mg/kg (original dose 5.4 mg/kg, Cycle 5, Reason: Provider Judgment), 3.2 mg/kg (original dose 5.4 mg/kg, Cycle 5, Reason: Provider Judgment), 4.4 mg/kg (original dose 5.4 mg/kg, Cycle 3, Reason: Provider Judgment) Administration: 226 mg (05/15/2020), 312 mg (06/05/2020), 312 mg (06/26/2020), 312 mg (07/17/2020)  for chemotherapy treatment.      CANCER STAGING: Cancer Staging Malignant neoplasm of upper-outer quadrant of right breast in female, estrogen receptor negative ([place]) Staging form: Breast, AJCC 8th Edition - Clinical: Stage IIB (cT3, cN0, cM0, G2, ER-, PR-, HER2+) - Signed by [name], MD on 07/15/2018   INTERVAL HISTORY:  [name], a 86 y.o. female, returns for routine follow-up and consideration for next cycle of chemotherapy. [name] was last seen on 08/08/2020.  Due for cycle #5 of Enhertu today.   Today she is accompanied by her husband. Overall, she tells me she has been feeling pretty well. She tolerated the previous treatment well. She denies abdominal pain or distention or early satiety.  Overall, she feels ready for next cycle of chemo today.    REVIEW OF SYSTEMS:    Review of Systems  Constitutional: Positive for fatigue (75%). Negative for appetite change.  Gastrointestinal: Negative for abdominal distention and abdominal pain.  All other systems reviewed and are negative.   PAST MEDICAL/SURGICAL HISTORY:  Past Medical History:  Diagnosis Date  . Cancer ([place]) 06/2018   right breast cancer  . GERD (gastroesophageal reflux disease)    occasional   . Hyperlipidemia   . Hypertension    clearance with note Dr Nevada Crane on chart  . Pneumonia    2 years ago/ states occ cough with sinus drainage- no fever  . Thyroid nodule    with biopsy- states following medically   Past Surgical History:  Procedure Laterality Date  . CATARACT EXTRACTION W/PHACO  11/30/2012   Procedure: CATARACT EXTRACTION PHACO AND INTRAOCULAR LENS PLACEMENT (IOC);  Surgeon: Williams Che, MD;  Location: [facility];  Service: Ophthalmology;  Laterality: Left;  CDE:11.49  . CATARACT EXTRACTION W/PHACO Right 03/15/2013   Procedure: CATARACT EXTRACTION PHACO AND INTRAOCULAR LENS PLACEMENT (IOC);  Surgeon: Williams Che, MD;  Location: [facility];  Service: Ophthalmology;  Laterality: Right;  CDE 16.15  . COLONOSCOPY    . EXCISION OF SKIN TAG Right 06/17/2016   Procedure: SHAVE OF SKIN TAG RIGHT BUTTOCK;  Surgeon: Aviva Signs, MD;  Location: [facility];  Service: General;  Laterality: Right;  . JOINT  REPLACEMENT     left knee  . MASS EXCISION Left 06/17/2016   Procedure: EXCISION SKIN MALIGNANT LESION LEFT BUTTOCK;  Surgeon: Aviva Signs, MD;  Location: [facility];  Service: General;  Laterality: Left;  . PORTACATH PLACEMENT Right 07/28/2018   Procedure: INSERTION PORT-A-CATH WITH ULTRASOUND;  Surgeon: Rolm Bookbinder, MD;  Location: [facility];  Service: General;  Laterality: Right;  . PUNCH BIOPSY OF SKIN Right 07/28/2018   Procedure: PUNCH BIOPSY OF SKIN RIGHT BREAST;  Surgeon: Rolm Bookbinder, MD;  Location: [facility];  Service: General;  Laterality: Right;  .  TOTAL KNEE ARTHROPLASTY  12/16/2011   Procedure: TOTAL KNEE ARTHROPLASTY;  Surgeon: Gearlean Alf, MD;  Location: [facility];  Service: Orthopedics;  Laterality: Right;  . TUBAL LIGATION      SOCIAL HISTORY:  Social History   Socioeconomic History  . Marital status: Married    Spouse name: Not on file  . Number of children: 2  . Years of education: some business school after HS  . Highest education level: Not on file  Occupational History  . Occupation: Cabin crew  . Occupation: Network engineer   Tobacco Use  . Smoking status: Never Smoker  . Smokeless tobacco: Never Used  Vaping Use  . Vaping Use: Never used  Substance and Sexual Activity  . Alcohol use: No  . Drug use: No  . Sexual activity: Yes    Birth control/protection: None  Other Topics Concern  . Not on file  Social History Narrative   Lives at home with her husband.   4 cups caffeine per day.   Right-handed.   Social Determinants of Health   Financial Resource Strain:   . Difficulty of Paying Living Expenses: Not on file  Food Insecurity:   . Worried About Charity fundraiser in the Last Year: Not on file  . Ran Out of Food in the Last Year: Not on file  Transportation Needs:   . Lack of Transportation (Medical): Not on file  . Lack of Transportation (Non-Medical): Not on file  Physical Activity:   . Days of Exercise per Week: Not on file  . Minutes of Exercise per Session: Not on file  Stress:   . Feeling of Stress : Not on file  Social Connections:   . Frequency of Communication with Friends and Family: Not on file  . Frequency of Social Gatherings with Friends and Family: Not on file  . Attends Religious Services: Not on file  . Active Member of Clubs or Organizations: Not on file  . Attends Archivist Meetings: Not on file  . Marital Status: Not on file  Intimate Partner Violence:   . Fear of Current or Ex-Partner: Not on file  . Emotionally Abused: Not on file  . Physically Abused: Not on file    . Sexually Abused: Not on file    FAMILY HISTORY:  Family History  Problem Relation Age of Onset  . Heart attack Mother   . Bronchitis Father   . Diabetes Sister   . Leukemia Brother   . Lung disease Brother   . Lung disease Sister     CURRENT MEDICATIONS:  Current Outpatient Medications  Medication Sig Dispense Refill  . acetaminophen (TYLENOL) 325 MG tablet Take 650 mg by mouth every 6 (six) hours as needed.     Marland Kitchen aspirin 325 MG tablet Take 162.5 mg by mouth daily.     . cetirizine (ZYRTEC) 10 MG tablet Take 10  mg by mouth daily.    . furosemide (LASIX) 20 MG tablet Take 1 tablet (20 mg total) by mouth daily. 30 tablet 3  . hydrocortisone 1 % ointment Apply 1 application topically 2 (two) times daily. 60 g 0  . Misc. Devices MISC Please provide lymphedema sleeve for right arm 2 each 0  . Multiple Vitamin (MULTIVITAMIN WITH MINERALS) TABS tablet Take 1 tablet by mouth daily.    Glory Rosebush ULTRA test strip USE 1 STRIP TO CHECK GLUCOSE 2 TO 3 TIMES DAILY    . lidocaine-prilocaine (EMLA) cream Apply to port site one hour prior to appointment and cover with plastic wrap. (Patient not taking: Reported on 08/24/2020) 30 g 3   No current facility-administered medications for this visit.   Facility-Administered Medications Ordered in Other Visits  Medication Dose Route Frequency Provider Last Rate Last Admin  . sodium chloride flush (NS) 0.9 % injection 10 mL  10 mL Intravenous PRN Derek Jack, MD   10 mL at 05/23/20 1025  . sodium chloride flush (NS) 0.9 % injection 10 mL  10 mL Intravenous PRN Derek Jack, MD   10 mL at 05/23/20 0900    ALLERGIES:  Allergies  Allergen Reactions  . Penicillins Rash    Has patient had a PCN reaction causing immediate rash, facial/tongue/throat swelling, SOB or lightheadedness with hypotension: No Has patient had a PCN reaction causing severe rash involving mucus membranes or skin necrosis: No Has patient had a PCN reaction that  required hospitalization: No Has patient had a PCN reaction occurring within the last 10 years: No If all of the above answers are "NO", then may proceed with Cephalosporin use.     PHYSICAL EXAM:  Performance status (ECOG): 1 - Symptomatic but completely ambulatory  Vitals:   08/24/20 1215  BP: (!) 155/75  Pulse: 95  Resp: 20  Temp: (!) 96.8 F (36 C)  SpO2: 100%   Wt Readings from Last 3 Encounters:  08/24/20 161 lb 9.6 oz (73.3 kg)  08/08/20 171 lb 9.6 oz (77.8 kg)  07/17/20 170 lb (77.1 kg)   Physical Exam Vitals reviewed.  Constitutional:      Appearance: Normal appearance.  Cardiovascular:     Rate and Rhythm: Normal rate and regular rhythm.     Pulses: Normal pulses.     Heart sounds: Murmur (systolic ejection murmur) heard.   Pulmonary:     Effort: Pulmonary effort is normal.     Breath sounds: Normal breath sounds.  Chest:     Comments: Port-a-Cath in R chest Abdominal:     Palpations: Abdomen is soft. There is no mass.     Tenderness: There is no abdominal tenderness.  Neurological:     General: No focal deficit present.     Mental Status: She is alert and oriented to person, place, and time.  Psychiatric:        Mood and Affect: Mood normal.        Behavior: Behavior normal.     LABORATORY DATA:  I have reviewed the labs as listed.  CBC Latest Ref Rng & Units 08/24/2020 08/08/2020 07/17/2020  WBC 4.0 - 10.5 K/uL 8.5 7.4 8.3  Hemoglobin 12.0 - 15.0 g/dL 11.7(L) 10.4(L) 9.5(L)  Hematocrit 36 - 46 % 34.1(L) 31.0(L) 29.0(L)  Platelets 150 - 400 K/uL 217 206 201   CMP Latest Ref Rng & Units 08/24/2020 08/08/2020 07/17/2020  Glucose 70 - 99 mg/dL 103(H) 101(H) 93  BUN 8 - 23 mg/dL 25(H)  18 21  Creatinine 0.44 - 1.00 mg/dL 1.11(H) 1.09(H) 1.04(H)  Sodium 135 - 145 mmol/L 136 137 137  Potassium 3.5 - 5.1 mmol/L 3.3(L) 3.4(L) 3.7  Chloride 98 - 111 mmol/L 105 108 108  CO2 22 - 32 mmol/L 20(L) 19(L) 20(L)  Calcium 8.9 - 10.3 mg/dL 9.2 8.7(L) 8.7(L)    Total Protein 6.5 - 8.1 g/dL 6.8 6.2(L) 6.2(L)  Total Bilirubin 0.3 - 1.2 mg/dL 1.2 1.5(H) 1.2  Alkaline Phos 38 - 126 U/L 141(H) 131(H) 138(H)  AST 15 - 41 U/L 53(H) 53(H) 51(H)  ALT 0 - 44 U/L _0 DIAGNOSTIC IMAGING:  I have independently reviewed the scans and discussed with the patient. CT CHEST ABDOMEN PELVIS W CONTRAST  Result Date: 08/03/2020 CLINICAL DATA:  Breast cancer, assess treatment response EXAM: CT CHEST, ABDOMEN, AND PELVIS WITH CONTRAST TECHNIQUE: Multidetector CT imaging of the chest, abdomen and pelvis was performed following the standard protocol during bolus administration of intravenous contrast. CONTRAST:  121m OMNIPAQUE IOHEXOL 300 MG/ML SOLN, additional oral enteric contrast COMPARISON:  04/26/2020 FINDINGS: CT CHEST FINDINGS Cardiovascular: Right chest port catheter. Aortic atherosclerosis. Aortic valve calcifications. Normal heart size. Three-vessel coronary artery calcifications. No pericardial effusion. Mediastinum/Nodes: No enlarged mediastinal, hilar, or axillary lymph nodes. Small hiatal hernia. Thyroid gland, trachea, and esophagus demonstrate no significant findings. Lungs/Pleura: Minimal scarring and or atelectasis of the lung bases. No pleural effusion or pneumothorax. Musculoskeletal: Skin thickening of the right breast. There is dense glandular tissue in the central right breast, a lobulated contrast enhancing mass identified in the central right breast on prior examination is not clearly appreciated (series 10, image 80). No suspicious bone lesions identified. CT ABDOMEN PELVIS FINDINGS Hepatobiliary: Cirrhotic morphology of the liver. No gallstones, gallbladder wall thickening, or biliary dilatation. Pancreas: Unremarkable. No pancreatic ductal dilatation or surrounding inflammatory changes. Spleen: Normal in size without significant abnormality. Adrenals/Urinary Tract: Adrenal glands are unremarkable. Kidneys are normal, without renal calculi, solid  lesion, or hydronephrosis. Bladder is unremarkable. Stomach/Bowel: Stomach is within normal limits. Incidental duodenal diverticulum. Appendix is not clearly visualized and may be surgically absent. Wall thickening of the cecal base (series 5, image 39). Sigmoid diverticulosis. Vascular/Lymphatic: No significant vascular findings are present. No enlarged abdominal or pelvic lymph nodes. Reproductive: No mass or other abnormality. Other: No abdominal wall hernia or abnormality. Moderate volume ascites throughout the abdomen and pelvis. Musculoskeletal: No acute or significant osseous findings. IMPRESSION: 1. Skin thickening of the right breast with dense glandular tissue in the central right breast, a lobulated contrast enhancing mass identified in the central right breast on prior examination is not clearly appreciated. Findings are most consistent with treatment response of primary breast malignancy; appearance of mass may be very sensitive to exact phase of contrast. 2. No evidence of lymphadenopathy or metastatic disease within the chest, abdomen, or pelvis. 3. Cirrhotic morphology of the liver. Moderate volume ascites throughout the abdomen and pelvis, new compared to prior examination. 4. Wall thickening of the cecal base. This most likely reflects portal colopathy in the presence of ascites and in the absence of acutely referable signs and symptoms. 5. Coronary artery disease.  Aortic Atherosclerosis (ICD10-I70.0). Electronically Signed   By: AEddie CandleM.D.   On: 08/03/2020 14:35     ASSESSMENT:  1. Metastatic right breast cancer to the skin, ER/PR negative, HER-2 positive: -Presentation with skin involvement of the right breast, axillary region and posterior chest wall. -12 cycles of weekly Taxol and Herceptin from 07/31/2018  through 10/30/2018 with PET scan showing progression. -Kadcyla from 02/15/2019 through 04/06/2020 with progression. -CT CAP on 04/26/2020 shows right breast mass measuring 3.7 x  2.8 (2.7 x 2.3). CC dimension measures 4.3 cm (3.9 cm). No new areas of metastatic disease. No skeletal metastasis. -Enhertustarted on 05/15/2020 at 3.4 mg/kg. -CT CAP from 08/03/2020 shows no evidence of lymphadenopathy or metastatic disease in the chest, abdomen or pelvis.  Cirrhotic morphology of the liver with moderate volume ascites throughout the abdomen and pelvis.  Wall thickening of the cecal base.  2. High risk drug monitoring: -2D echo on 04/03/2020 shows EF 65 to 70%.   PLAN:  1. Metastatic right breast cancer to the skin, ER/PR negative, HER-2 positive: -She is feeling well.  Reviewed labs from today.  AST is 53 and stable.  Total bilirubin improved to 1.2.  Alk phos is 141. -CBC was normal.  I will cut back on the dose of Enhertu 3.2 mg/kilogram. -RTC 3 weeks for follow-up.  2. High risk drug monitoring: -We will continue periodic echocardiograms.  3. Hypokalemia: -Potassium is 3.3.  Will supplement in the clinic today.  4. Skin rash: -Continue steroid cream on the lower back for itching.  5. Elevated LFTs: -AST is 53.  Alk phos is elevated at 141.  This is from cirrhosis which was found on the most recent CT scan.  We will closely monitor.  We will dose reduce Enhertu today.  6. CKD: -Creatinine is 1.11.  Continue to avoid nephrotoxins.  7. Macrocytic anemia: -Anemia from iron deficiency and CKD and myelosuppression. -Hemoglobin today is 11.7.  8.  New onset cirrhosis and ascites: -Latest CT scan showed cirrhosis with moderate ascites.  We started her on Lasix 20 mg daily. -Abdominal distention improved.  We will cut back on Lasix to 20 mg Mondays, Wednesdays and Fridays.   Orders placed this encounter:  No orders of the defined types were placed in this encounter.    Derek Jack, MD [place] [phone]   I, Milinda Antis, am acting as a scribe for Dr. Sanda Linger.  I, Derek Jack MD, have  reviewed the above documentation for accuracy and completeness, and I agree with the above.

## 2020-08-24 NOTE — Progress Notes (Signed)
Pharmacy only has 2 x 100 mg on hand.  Per Dr [name] ok to give 200 mg today verses 227 mg ordered.  V.O. Dr Doreen Salvage, PharmD

## 2020-09-05 DIAGNOSIS — R69 Illness, unspecified: Secondary | ICD-10-CM | POA: Diagnosis not present

## 2020-09-14 ENCOUNTER — Inpatient Hospital Stay (HOSPITAL_COMMUNITY): Payer: Medicare HMO | Attending: Hematology

## 2020-09-14 ENCOUNTER — Inpatient Hospital Stay (HOSPITAL_COMMUNITY): Payer: Medicare HMO

## 2020-09-14 ENCOUNTER — Inpatient Hospital Stay (HOSPITAL_BASED_OUTPATIENT_CLINIC_OR_DEPARTMENT_OTHER): Payer: Medicare HMO | Admitting: Hematology

## 2020-09-14 ENCOUNTER — Other Ambulatory Visit: Payer: Self-pay

## 2020-09-14 VITALS — BP 127/67 | HR 57 | Temp 96.8°F | Resp 18

## 2020-09-14 VITALS — BP 160/66 | HR 94 | Temp 96.8°F | Resp 20 | Wt 164.9 lb

## 2020-09-14 DIAGNOSIS — Z79899 Other long term (current) drug therapy: Secondary | ICD-10-CM | POA: Diagnosis not present

## 2020-09-14 DIAGNOSIS — D631 Anemia in chronic kidney disease: Secondary | ICD-10-CM | POA: Insufficient documentation

## 2020-09-14 DIAGNOSIS — Z7982 Long term (current) use of aspirin: Secondary | ICD-10-CM | POA: Diagnosis not present

## 2020-09-14 DIAGNOSIS — C50411 Malignant neoplasm of upper-outer quadrant of right female breast: Secondary | ICD-10-CM | POA: Insufficient documentation

## 2020-09-14 DIAGNOSIS — R188 Other ascites: Secondary | ICD-10-CM | POA: Diagnosis not present

## 2020-09-14 DIAGNOSIS — E876 Hypokalemia: Secondary | ICD-10-CM | POA: Diagnosis not present

## 2020-09-14 DIAGNOSIS — E785 Hyperlipidemia, unspecified: Secondary | ICD-10-CM | POA: Diagnosis not present

## 2020-09-14 DIAGNOSIS — K219 Gastro-esophageal reflux disease without esophagitis: Secondary | ICD-10-CM | POA: Insufficient documentation

## 2020-09-14 DIAGNOSIS — R21 Rash and other nonspecific skin eruption: Secondary | ICD-10-CM | POA: Diagnosis not present

## 2020-09-14 DIAGNOSIS — I129 Hypertensive chronic kidney disease with stage 1 through stage 4 chronic kidney disease, or unspecified chronic kidney disease: Secondary | ICD-10-CM | POA: Insufficient documentation

## 2020-09-14 DIAGNOSIS — K7460 Unspecified cirrhosis of liver: Secondary | ICD-10-CM | POA: Insufficient documentation

## 2020-09-14 DIAGNOSIS — D509 Iron deficiency anemia, unspecified: Secondary | ICD-10-CM | POA: Diagnosis not present

## 2020-09-14 DIAGNOSIS — I10 Essential (primary) hypertension: Secondary | ICD-10-CM | POA: Diagnosis not present

## 2020-09-14 DIAGNOSIS — N189 Chronic kidney disease, unspecified: Secondary | ICD-10-CM | POA: Diagnosis not present

## 2020-09-14 DIAGNOSIS — Z5111 Encounter for antineoplastic chemotherapy: Secondary | ICD-10-CM | POA: Diagnosis present

## 2020-09-14 DIAGNOSIS — Z171 Estrogen receptor negative status [ER-]: Secondary | ICD-10-CM

## 2020-09-14 DIAGNOSIS — E079 Disorder of thyroid, unspecified: Secondary | ICD-10-CM | POA: Diagnosis not present

## 2020-09-14 LAB — CBC WITH DIFFERENTIAL/PLATELET
Abs Immature Granulocytes: 0.02 10*3/uL (ref 0.00–0.07)
Basophils Absolute: 0.1 10*3/uL (ref 0.0–0.1)
Basophils Relative: 1 %
Eosinophils Absolute: 0.5 10*3/uL (ref 0.0–0.5)
Eosinophils Relative: 7 %
HCT: 34.8 % — ABNORMAL LOW (ref 36.0–46.0)
Hemoglobin: 11.8 g/dL — ABNORMAL LOW (ref 12.0–15.0)
Immature Granulocytes: 0 %
Lymphocytes Relative: 39 %
Lymphs Abs: 2.9 10*3/uL (ref 0.7–4.0)
MCH: 34.9 pg — ABNORMAL HIGH (ref 26.0–34.0)
MCHC: 33.9 g/dL (ref 30.0–36.0)
MCV: 103 fL — ABNORMAL HIGH (ref 80.0–100.0)
Monocytes Absolute: 1.1 10*3/uL — ABNORMAL HIGH (ref 0.1–1.0)
Monocytes Relative: 14 %
Neutro Abs: 2.9 10*3/uL (ref 1.7–7.7)
Neutrophils Relative %: 39 %
Platelets: 175 10*3/uL (ref 150–400)
RBC: 3.38 MIL/uL — ABNORMAL LOW (ref 3.87–5.11)
RDW: 17.3 % — ABNORMAL HIGH (ref 11.5–15.5)
WBC: 7.5 10*3/uL (ref 4.0–10.5)
nRBC: 0 % (ref 0.0–0.2)

## 2020-09-14 LAB — COMPREHENSIVE METABOLIC PANEL
ALT: 25 U/L (ref 0–44)
AST: 48 U/L — ABNORMAL HIGH (ref 15–41)
Albumin: 3 g/dL — ABNORMAL LOW (ref 3.5–5.0)
Alkaline Phosphatase: 125 U/L (ref 38–126)
Anion gap: 9 (ref 5–15)
BUN: 27 mg/dL — ABNORMAL HIGH (ref 8–23)
CO2: 21 mmol/L — ABNORMAL LOW (ref 22–32)
Calcium: 9.1 mg/dL (ref 8.9–10.3)
Chloride: 106 mmol/L (ref 98–111)
Creatinine, Ser: 1.17 mg/dL — ABNORMAL HIGH (ref 0.44–1.00)
GFR, Estimated: 45 mL/min — ABNORMAL LOW (ref >60)
Glucose, Bld: 98 mg/dL (ref 70–99)
Potassium: 3.5 mmol/L (ref 3.5–5.1)
Sodium: 136 mmol/L (ref 135–145)
Total Bilirubin: 1.3 mg/dL — ABNORMAL HIGH (ref 0.3–1.2)
Total Protein: 6.3 g/dL — ABNORMAL LOW (ref 6.5–8.1)

## 2020-09-14 LAB — MAGNESIUM: Magnesium: 1.7 mg/dL (ref 1.7–2.4)

## 2020-09-14 MED ORDER — SODIUM CHLORIDE 0.9 % IV SOLN
10.0000 mg | Freq: Once | INTRAVENOUS | Status: AC
  Administered 2020-09-14: 10 mg via INTRAVENOUS
  Filled 2020-09-14: qty 10

## 2020-09-14 MED ORDER — FAM-TRASTUZUMAB DERUXTECAN-NXKI CHEMO 100 MG IV SOLR
3.2000 mg/kg | Freq: Once | INTRAVENOUS | Status: AC
  Administered 2020-09-14: 226 mg via INTRAVENOUS
  Filled 2020-09-14: qty 11.3

## 2020-09-14 MED ORDER — DIPHENHYDRAMINE HCL 25 MG PO CAPS
50.0000 mg | ORAL_CAPSULE | Freq: Once | ORAL | Status: AC
  Administered 2020-09-14: 50 mg via ORAL
  Filled 2020-09-14: qty 2

## 2020-09-14 MED ORDER — ACETAMINOPHEN 325 MG PO TABS
650.0000 mg | ORAL_TABLET | Freq: Once | ORAL | Status: AC
  Administered 2020-09-14: 650 mg via ORAL
  Filled 2020-09-14: qty 2

## 2020-09-14 MED ORDER — PALONOSETRON HCL INJECTION 0.25 MG/5ML
0.2500 mg | Freq: Once | INTRAVENOUS | Status: AC
  Administered 2020-09-14: 0.25 mg via INTRAVENOUS
  Filled 2020-09-14: qty 5

## 2020-09-14 MED ORDER — DEXTROSE 5 % IV SOLN: Freq: Once | INTRAVENOUS | Status: AC

## 2020-09-14 NOTE — Patient Instructions (Signed)
[place] at [facility] Discharge Instructions  You were seen today by Dr. [name]. He went over your recent results. You received your treatment today; continue receiving your treatment every 3 weeks. You will be scheduled for a CT scan of your chest and abdomen before your next visit. Dr. [name] will see you back in 6 weeks for labs and follow up.   Thank you for choosing [place] at [facility] to provide your oncology and hematology care.  To afford each patient quality time with our provider, please arrive at least 15 minutes before your scheduled appointment time.   If you have a lab appointment with the [facility] please come in thru the Main Entrance and check in at the main information desk  You need to re-schedule your appointment should you arrive 10 or more minutes late.  We strive to give you quality time with our providers, and arriving late affects you and other patients whose appointments are after yours.  Also, if you no show three or more times for appointments you may be dismissed from the clinic at the providers discretion.     Again, thank you for choosing [facility]  Our hope is that these requests will decrease the amount of time that you wait before being seen by our physicians.       _____________________________________________________________  Should you have questions after your visit to [facility], please contact our office at [phone] between the hours of 8:00 a.m. and 4:30 p.m.  Voicemails left after 4:00 p.m. will not be returned until the following business day.  For prescription refill requests, have your pharmacy contact our office and allow 72 hours.    Cancer Center Support Programs:   > Cancer Support Group  2nd Tuesday of the month 1pm-2pm, Journey Room

## 2020-09-14 NOTE — Progress Notes (Signed)
[facility] [address] [place] [address]   CLINIC:  Medical Oncology/Hematology  PCP:  Adrienne Squibb, MD [address] [name] [place] [address] [phone]   REASON FOR VISIT:  Follow-up for HER-2 positive right breast cancer to skin  PRIOR THERAPY:  1. Herceptin and Taxol x 12 cycles from 07/31/2018 to 10/30/2018. 2. Kadcylax 17 cyclesfrom 02/15/2019 to 04/06/2020.  NGS Results: Not done  CURRENT THERAPY: Enhertu every 3 weeks  BRIEF ONCOLOGIC HISTORY:  Oncology History  Malignant neoplasm of upper-outer quadrant of right breast in female, estrogen receptor negative ([place])  07/03/2018 Initial Diagnosis   Palpable right breast mass for several months with skin discoloration, clinically skin trabecular thickening with nipple retraction measuring 7.2 x 4.3 x 4.2 cm, additional mass 4 o'clock position 2.6 cm, right axillary tail 1.2 cm right axillary lymph node 1.8 cm left breast irregular mass 1 cm, adjacent mass 0.9 cm together measuring 1.9 cm, no lymphadenopathy   07/03/2018 Pathology Results   Right breast biopsy: IDC grade 2, ER 0%, PR 0%, HER-2 positive, Ki-67 40%, lymph node biopsy negative, left breast biopsy complex sclerosing lesion    07/15/2018 Cancer Staging   Staging form: Breast, AJCC 8th Edition - Clinical: Stage IIB (cT3, cN0, cM0, G2, ER-, PR-, HER2+) - Signed by Adrienne Lose, MD on 07/15/2018   07/31/2018 - 10/30/2018 Chemotherapy   The patient had trastuzumab (HERCEPTIN) 336 mg in sodium chloride 0.9 % 250 mL chemo infusion, 4 mg/kg = 336 mg, Intravenous,  Once, 4 of 4 cycles Administration: 336 mg (07/31/2018), 168 mg (08/07/2018), 168 mg (08/28/2018), 168 mg (08/14/2018), 168 mg (08/21/2018), 168 mg (09/04/2018), 168 mg (09/11/2018), 168 mg (09/18/2018), 168 mg (10/02/2018), 168 mg (10/09/2018), 168 mg (10/16/2018), 168 mg (10/23/2018), 168 mg (10/30/2018) PACLitaxel (TAXOL) 78 mg in sodium chloride 0.9 % 250 mL chemo infusion (</=  68m/m2), 40 mg/m2 = 78 mg (50 % of original dose 80 mg/m2), Intravenous,  Once, 4 of 4 cycles Dose modification: 40 mg/m2 (50 % of original dose 80 mg/m2, Cycle 1, Reason: Patient Age), 53.3333 mg/m2 (66.7 % of original dose 80 mg/m2, Cycle 1, Reason: Provider Judgment), 45 mg/m2 (original dose 80 mg/m2, Cycle 3, Reason: Provider Judgment) Administration: 78 mg (07/31/2018), 102 mg (08/07/2018), 102 mg (08/28/2018), 102 mg (08/14/2018), 102 mg (08/21/2018), 102 mg (09/04/2018), 102 mg (09/18/2018), 84 mg (10/02/2018), 84 mg (10/09/2018), 84 mg (10/16/2018), 84 mg (10/23/2018), 84 mg (10/30/2018)  for chemotherapy treatment.    11/13/2018 - 01/25/2019 Chemotherapy   The patient had trastuzumab (HERCEPTIN) 504 mg in sodium chloride 0.9 % 250 mL chemo infusion, 6 mg/kg = 504 mg (100 % of original dose 6 mg/kg), Intravenous,  Once, 4 of 5 cycles Dose modification: 6 mg/kg (original dose 6 mg/kg, Cycle 1, Reason: Other (see comments), Comment: pt receving it ) Administration: 504 mg (11/13/2018), 504 mg (12/04/2018), 504 mg (01/04/2019), 504 mg (01/25/2019)  for chemotherapy treatment.    02/15/2019 - 04/06/2020 Chemotherapy   The patient had ado-trastuzumab emtansine (KADCYLA) 240 mg in sodium chloride 0.9 % 250 mL chemo infusion, 3 mg/kg = 240 mg (100 % of original dose 3 mg/kg), Intravenous, Once, 17 of 18 cycles Dose modification: 3 mg/kg (original dose 3 mg/kg, Cycle 1, Reason: Provider Judgment), 3 mg/kg (original dose 3 mg/kg, Cycle 2, Reason: Provider Judgment), 2.4 mg/kg (original dose 3 mg/kg, Cycle 11, Reason: Other (see comments), Comment: bili upper limit of normal), 2.4 mg/kg (original dose 3 mg/kg, Cycle  12, Reason: Other (see comments), Comment: elevated bilirubin) Administration: 240 mg (02/15/2019), 240 mg (03/08/2019), 240 mg (05/10/2019), 240 mg (03/29/2019), 240 mg (04/19/2019), 240 mg (06/01/2019), 240 mg (06/22/2019), 240 mg (07/20/2019), 240 mg (08/10/2019), 240 mg (09/01/2019), 200 mg (09/22/2019), 180 mg  (11/03/2019), 180 mg (12/09/2019), 180 mg (01/06/2020), 180 mg (02/03/2020), 180 mg (03/09/2020), 180 mg (04/06/2020)  for chemotherapy treatment.    05/15/2020 -  Chemotherapy   The patient had dexamethasone (DECADRON) 4 MG tablet, 8 mg, Oral, Daily, 1 of 1 cycle, Start date: --, End date: -- palonosetron (ALOXI) injection 0.25 mg, 0.25 mg, Intravenous,  Once, 5 of 9 cycles Administration: 0.25 mg (05/15/2020), 0.25 mg (06/05/2020), 0.25 mg (08/24/2020), 0.25 mg (06/26/2020), 0.25 mg (07/17/2020) fam-trastuzumab deruxtecan-nxki (ENHERTU) 226 mg in dextrose 5 % 100 mL chemo infusion, 3.2 mg/kg = 226 mg (59.3 % of original dose 5.4 mg/kg), Intravenous,  Once, 5 of 9 cycles Dose modification: 3.2 mg/kg (original dose 5.4 mg/kg, Cycle 1, Reason: Provider Judgment), 4.4 mg/kg (original dose 5.4 mg/kg, Cycle 2, Reason: Provider Judgment), 4.4 mg/kg (original dose 5.4 mg/kg, Cycle 5, Reason: Provider Judgment), 3.2 mg/kg (original dose 5.4 mg/kg, Cycle 5, Reason: Provider Judgment), 3.2 mg/kg (original dose 5.4 mg/kg, Cycle 6, Reason: Provider Judgment), 4.4 mg/kg (original dose 5.4 mg/kg, Cycle 3, Reason: Provider Judgment), 200 mg (original dose 5.4 mg/kg, Cycle 5, Reason: Other (see comments), Comment: all pharmacy has on hand) Administration: 226 mg (05/15/2020), 312 mg (06/05/2020), 312 mg (06/26/2020), 312 mg (07/17/2020), 200 mg (08/24/2020)  for chemotherapy treatment.      CANCER STAGING: Cancer Staging Malignant neoplasm of upper-outer quadrant of right breast in female, estrogen receptor negative ([place]) Staging form: Breast, AJCC 8th Edition - Clinical: Stage IIB (cT3, cN0, cM0, G2, ER-, PR-, HER2+) - Signed by Adrienne Lose, MD on 07/15/2018   INTERVAL HISTORY:  [name], a 86 y.o. female, returns for routine follow-up and consideration for next cycle of chemotherapy. [name] was last seen on 08/24/2020.  Due for cycle #6 of Enhertu today.   Today she is accompanied by her husband. Overall, she tells  me she has been feeling pretty well. She takes Lasix three times a week and denies feeling abdominal distention or leg distention. She tolerated the previous treatment well. She reports having ongoing itching on her back which she scratches occasionally, but is apply cortisone cream regularly. Her appetite is excellent. She denies having dyspnea or cough.  Overall, she feels ready for next cycle of chemo today.    REVIEW OF SYSTEMS:  Review of Systems  Constitutional: Positive for fatigue (75%). Negative for appetite change.  Respiratory: Negative for cough and shortness of breath.   Cardiovascular: Negative for leg swelling.  Gastrointestinal: Negative for abdominal distention.  All other systems reviewed and are negative.   PAST MEDICAL/SURGICAL HISTORY:  Past Medical History:  Diagnosis Date   Cancer ([place]) 06/2018   right breast cancer   GERD (gastroesophageal reflux disease)    occasional    Hyperlipidemia    Hypertension    clearance with note Dr Nevada Crane on chart   Pneumonia    2 years ago/ states occ cough with sinus drainage- no fever   Thyroid nodule    with biopsy- states following medically   Past Surgical History:  Procedure Laterality Date   CATARACT EXTRACTION [facility]  11/30/2012   Procedure: CATARACT EXTRACTION PHACO AND INTRAOCULAR LENS PLACEMENT ([place]);  Surgeon: Williams Che, MD;  Location: [facility];  Service: Ophthalmology;  Laterality: Left;  CDE:11.49   CATARACT EXTRACTION W/PHACO Right 03/15/2013   Procedure: CATARACT EXTRACTION PHACO AND INTRAOCULAR LENS PLACEMENT (I[facility]);  Surgeon: Williams Che, MD;  Location: [facility];  Service: Ophthalmology;  Laterality: Right;  CDE 16.15   COLONOSCOPY     EXCISION OF SKIN TAG Right 06/17/2016   Procedure: SHAVE OF SKIN TAG RIGHT BUTTOCK;  Surgeon: Aviva Signs, MD;  Location: [facility];  Service: General;  Laterality: Right;   JOINT REPLACEMENT     left knee   MASS EXCISION Left 06/17/2016   Procedure: EXCISION  SKIN MALIGNANT LESION LEFT BUTTOCK;  Surgeon: Aviva Signs, MD;  Location: [facility];  Service: General;  Laterality: Left;   PORTACATH PLACEMENT Right 07/28/2018   Procedure: INSERTION PORT-A-CATH WITH ULTRASOUND;  Surgeon: Rolm Bookbinder, MD;  Location: [facility];  Service: General;  Laterality: Right;   PUNCH BIOPSY OF SKIN Right 07/28/2018   Procedure: PUNCH BIOPSY OF SKIN RIGHT BREAST;  Surgeon: Rolm Bookbinder, MD;  Location: [facility];  Service: General;  Laterality: Right;   TOTAL KNEE ARTHROPLASTY  12/16/2011   Procedure: TOTAL KNEE ARTHROPLASTY;  Surgeon: Gearlean Alf, MD;  Location: [facility];  Service: Orthopedics;  Laterality: Right;   TUBAL LIGATION      SOCIAL HISTORY:  Social History   Socioeconomic History   Marital status: Married    Spouse name: Not on file   Number of children: 2   Years of education: some business school after HS   Highest education level: Not on file  Occupational History   Occupation: Cabin crew   Occupation: Network engineer   Tobacco Use   Smoking status: Never Smoker   Smokeless tobacco: Never Used  Scientific laboratory technician Use: Never used  Substance and Sexual Activity   Alcohol use: No   Drug use: No   Sexual activity: Yes    Birth control/protection: None  Other Topics Concern   Not on file  Social History Narrative   Lives at home with her husband.   4 cups caffeine per day.   Right-handed.   Social Determinants of Health   Financial Resource Strain:    Difficulty of Paying Living Expenses: Not on file  Food Insecurity:    Worried About Charity fundraiser in the Last Year: Not on file   [employer] of Food in the Last Year: Not on file  Transportation Needs:    Lack of Transportation (Medical): Not on file   Lack of Transportation (Non-Medical): Not on file  Physical Activity:    Days of Exercise per Week: Not on file   Minutes of Exercise per Session: Not on file  Stress:     Feeling of Stress : Not on file  Social Connections:    Frequency of Communication with Friends and Family: Not on file   Frequency of Social Gatherings with Friends and Family: Not on file   Attends Religious Services: Not on file   Active Member of Clubs or Organizations: Not on file   Attends Archivist Meetings: Not on file   Marital Status: Not on file  Intimate Partner Violence:    Fear of Current or Ex-Partner: Not on file   Emotionally Abused: Not on file   Physically Abused: Not on file   Sexually Abused: Not on file    FAMILY HISTORY:  Family History  Problem Relation Age of Onset   Heart attack Mother    Bronchitis Father  Diabetes Sister    Leukemia Brother    Lung disease Brother    Lung disease Sister     CURRENT MEDICATIONS:  Current Outpatient Medications  Medication Sig Dispense Refill   acetaminophen (TYLENOL) 325 MG tablet Take 650 mg by mouth every 6 (six) hours as needed.      aspirin 325 MG tablet Take 162.5 mg by mouth daily.      cetirizine (ZYRTEC) 10 MG tablet Take 10 mg by mouth daily.     furosemide (LASIX) 20 MG tablet Take 1 tablet (20 mg total) by mouth daily. 30 tablet 3   hydrocortisone 1 % ointment Apply 1 application topically 2 (two) times daily. 60 g 0   Misc. Devices MISC Please provide lymphedema sleeve for right arm 2 each 0   Multiple Vitamin (MULTIVITAMIN WITH MINERALS) TABS tablet Take 1 tablet by mouth daily.     ONETOUCH ULTRA test strip USE 1 STRIP TO CHECK GLUCOSE 2 TO 3 TIMES DAILY     lidocaine-prilocaine (EMLA) cream Apply to port site one hour prior to appointment and cover with plastic wrap. (Patient not taking: Reported on 09/14/2020) 30 g 3   No current facility-administered medications for this visit.   Facility-Administered Medications Ordered in Other Visits  Medication Dose Route Frequency Provider Last Rate Last Admin   sodium chloride flush (NS) 0.9 % injection 10 mL  10 mL  Intravenous PRN Derek Jack, MD   10 mL at 05/23/20 1025   sodium chloride flush (NS) 0.9 % injection 10 mL  10 mL Intravenous PRN Derek Jack, MD   10 mL at 05/23/20 0900    ALLERGIES:  Allergies  Allergen Reactions   Penicillins Rash    Has patient had a PCN reaction causing immediate rash, facial/tongue/throat swelling, SOB or lightheadedness with hypotension: No Has patient had a PCN reaction causing severe rash involving mucus membranes or skin necrosis: No Has patient had a PCN reaction that required hospitalization: No Has patient had a PCN reaction occurring within the last 10 years: No If all of the above answers are "NO", then may proceed with Cephalosporin use.     PHYSICAL EXAM:  Performance status (ECOG): 1 - Symptomatic but completely ambulatory  Vitals:   09/14/20 1154  BP: (!) 160/66  Pulse: 94  Resp: 20  Temp: (!) 96.8 F (36 C)  SpO2: 100%   Wt Readings from Last 3 Encounters:  09/14/20 164 lb 14.4 oz (74.8 kg)  08/24/20 161 lb 9.6 oz (73.3 kg)  08/08/20 171 lb 9.6 oz (77.8 kg)   Physical Exam Vitals reviewed.  Constitutional:      Appearance: Normal appearance.  Cardiovascular:     Rate and Rhythm: Normal rate and regular rhythm.     Pulses: Normal pulses.     Heart sounds: Normal heart sounds.  Pulmonary:     Effort: Pulmonary effort is normal.     Breath sounds: Normal breath sounds.  Chest:     Breasts:        Right: Mass (upper outer quadrant mass 5 cm in diameter) present. No inverted nipple, skin change or tenderness.      Comments: Port-a-Cath in R chest Musculoskeletal:     Right lower leg: No edema.     Left lower leg: No edema.  Lymphadenopathy:     Upper Body:     Right upper body: No supraclavicular, axillary or pectoral adenopathy.  Skin:      Neurological:  General: No focal deficit present.     Mental Status: She is alert and oriented to person, place, and time.  Psychiatric:        Mood and Affect:  Mood normal.        Behavior: Behavior normal.     LABORATORY DATA:  I have reviewed the labs as listed.  CBC Latest Ref Rng & Units 09/14/2020 08/24/2020 08/08/2020  WBC 4.0 - 10.5 K/uL 7.5 8.5 7.4  Hemoglobin 12.0 - 15.0 g/dL 11.8(L) 11.7(L) 10.4(L)  Hematocrit 36 - 46 % 34.8(L) 34.1(L) 31.0(L)  Platelets 150 - 400 K/uL 175 217 206   CMP Latest Ref Rng & Units 09/14/2020 08/24/2020 08/08/2020  Glucose 70 - 99 mg/dL 98 103(H) 101(H)  BUN 8 - 23 mg/dL 27(H) 25(H) 18  Creatinine 0.44 - 1.00 mg/dL 1.17(H) 1.11(H) 1.09(H)  Sodium 135 - 145 mmol/L 136 136 137  Potassium 3.5 - 5.1 mmol/L 3.5 3.3(L) 3.4(L)  Chloride 98 - 111 mmol/L 106 105 108  CO2 22 - 32 mmol/L 21(L) 20(L) 19(L)  Calcium 8.9 - 10.3 mg/dL 9.1 9.2 8.7(L)  Total Protein 6.5 - 8.1 g/dL 6.3(L) 6.8 6.2(L)  Total Bilirubin 0.3 - 1.2 mg/dL 1.3(H) 1.2 1.5(H)  Alkaline Phos 38 - 126 U/L 125 141(H) 131(H)  AST 15 - 41 U/L 48(H) 53(H) 53(H)  ALT 0 - 44 U/L _0 DIAGNOSTIC IMAGING:  I have independently reviewed the scans and discussed with the patient. No results found.   ASSESSMENT:  1. Metastatic right breast cancer to the skin, ER/PR negative, HER-2 positive: -Presentation with skin involvement of the right breast, axillary region and posterior chest wall. -12 cycles of weekly Taxol and Herceptin from 07/31/2018 through 10/30/2018 with PET scan showing progression. -Kadcyla from 02/15/2019 through 04/06/2020 with progression. -CT CAP on 04/26/2020 shows right breast mass measuring 3.7 x 2.8 (2.7 x 2.3). CC dimension measures 4.3 cm (3.9 cm). No new areas of metastatic disease. No skeletal metastasis. -Enhertustarted on 05/15/2020 at 3.4 mg/kg. -CT CAP from 08/03/2020 shows no evidence of lymphadenopathy or metastatic disease in the chest, abdomen or pelvis. Cirrhotic morphology of the liver with moderate volume ascites throughout the abdomen and pelvis. Wall thickening of the cecal base.  2. High risk drug  monitoring: -2D echo on 04/03/2020 shows EF 65 to 70%.   PLAN:  1. Metastatic right breast cancer to the skin, ER/PR negative, HER-2 positive: -She has tolerated last dose of Enhertu very well. -Physical examination today did not reveal any erythema in the breast or posterior chest wall. -Breast mass has improved in size.  Slight inversion of the right nipple. -Reviewed labs today.  We will proceed with Enhertu 3.2 mg/kg dose. -She will be treated again after review of labs in 3 weeks.  Follow-up in 6 weeks with repeat CT CAP. -I have discussed with the patient's husband that treatment will be continued until progression or intolerance.  2. High risk drug monitoring: -No signs of CHF.  Continue periodic echocardiograms.  3. Hypokalemia: -Potassium is 3.5.  No supplementation needed.  4. Skin rash: -Continue steroid cream in the lower back and right upper back.  5. Elevated LFTs: -She has slightly elevated liver enzymes from cirrhosis. -AST has improved to 48.  Bilirubin is 1.3.  We will closely monitor.  6. CKD: -Creatinine today is 1.17.  Slight elevation likely from Lasix.  7. Macrocytic anemia: -Anemia from iron deficiency and CKD and myelosuppression.  Hemoglobin today is 11.8.  8.  New onset cirrhosis and ascites: -Last CT scan showed cirrhosis with moderate ascites.  We started her on Lasix 20 mg daily which was cut back to 1 tablet Monday, Wednesday and Friday. -No abdominal distention today.  Continue same dose of Lasix.   Orders placed this encounter:  No orders of the defined types were placed in this encounter.    Derek Jack, MD [place] [phone]   I, Milinda Antis, am acting as a scribe for Dr. Sanda Linger.  I, Derek Jack MD, have reviewed the above documentation for accuracy and completeness, and I agree with the above.

## 2020-09-14 NOTE — Progress Notes (Signed)
Resume dose of 3.2 mg/kg as pharmacy has full dose on hand.  T.O. Dr Rhys Martini, PharmD

## 2020-09-14 NOTE — Progress Notes (Signed)
[name] presents today for [employer]. Pt denies any new changes or symptoms since last treatment. Lab results and vitals have been reviewed and are stable and within parameters for treatment. Patient has been assessed by Dr. [name] who has approved proceeding with treatment today as planned.  Infusions tolerated without incident or complaint. VSS upon completion of treatment. Port flushed and deaccessed per protocol, see MAR and IV flowsheet for details. Discharged in satisfactory condition with follow up instructions.

## 2020-09-14 NOTE — Patient Instructions (Signed)
[facility] Discharge Instructions for Patients Receiving Chemotherapy   Beginning January 23rd 2017 lab work for the Cancer Center will be done in the  Main lab at [facility] on 1st floor. If you have a lab appointment with the Cancer Center please come in thru the  Main Entrance and check in at the main information desk   Today you received the following chemotherapy agents Enhertu  To help prevent nausea and vomiting after your treatment, we encourage you to take your nausea medication    If you develop nausea and vomiting, or diarrhea that is not controlled by your medication, call the clinic.  The clinic phone number is [phone]. Office hours are Monday-Friday 8:30am-5:00pm.  BELOW ARE SYMPTOMS THAT SHOULD BE REPORTED IMMEDIATELY:  *FEVER GREATER THAN 101.0 F  *CHILLS WITH OR WITHOUT FEVER  NAUSEA AND VOMITING THAT IS NOT CONTROLLED WITH YOUR NAUSEA MEDICATION  *UNUSUAL SHORTNESS OF BREATH  *UNUSUAL BRUISING OR BLEEDING  TENDERNESS IN MOUTH AND THROAT WITH OR WITHOUT PRESENCE OF ULCERS  *URINARY PROBLEMS  *BOWEL PROBLEMS  UNUSUAL RASH Items with * indicate a potential emergency and should be followed up as soon as possible. If you have an emergency after office hours please contact your primary care physician or go to the nearest emergency department.  Please call the clinic during office hours if you have any questions or concerns.   You may also contact the Patient Navigator at [phone] should you have any questions or need assistance in obtaining follow up care.      Resources For Cancer Patients and their Caregivers ? American Cancer Society: Can assist with transportation, wigs, general needs, runs Look Good Feel Better.        1-888-227-6333 ? Cancer Care: Provides financial assistance, online support groups, medication/co-pay assistance.  1-800-813-HOPE (4673) ? [facility] Assists [place] cancer  patients and their families through emotional , educational and financial support.  336-427-4357 ? [place] DSS Where to apply for food stamps, Medicaid and utility assistance. 336-342-1394 ? [facility]: Transportation to medical appointments. 336-347-2287 ? Social Security Administration: May apply for disability if have a Stage IV cancer. 336-342-7796 1-800-772-1213 ? [place] Aging, Disability and Transit Services: Assists with nutrition, care and transit needs. 336-349-2343          

## 2020-10-05 ENCOUNTER — Other Ambulatory Visit: Payer: Self-pay | Admitting: Hematology and Oncology

## 2020-10-05 ENCOUNTER — Inpatient Hospital Stay (HOSPITAL_COMMUNITY): Payer: Medicare HMO

## 2020-10-05 ENCOUNTER — Inpatient Hospital Stay (HOSPITAL_COMMUNITY): Payer: Medicare HMO | Attending: Hematology

## 2020-10-05 ENCOUNTER — Other Ambulatory Visit: Payer: Self-pay

## 2020-10-05 VITALS — BP 123/61 | HR 89 | Temp 97.2°F | Resp 18 | Wt 174.8 lb

## 2020-10-05 DIAGNOSIS — C792 Secondary malignant neoplasm of skin: Secondary | ICD-10-CM | POA: Insufficient documentation

## 2020-10-05 DIAGNOSIS — C50411 Malignant neoplasm of upper-outer quadrant of right female breast: Secondary | ICD-10-CM

## 2020-10-05 DIAGNOSIS — W010XXA Fall on same level from slipping, tripping and stumbling without subsequent striking against object, initial encounter: Secondary | ICD-10-CM | POA: Diagnosis present

## 2020-10-05 DIAGNOSIS — E785 Hyperlipidemia, unspecified: Secondary | ICD-10-CM | POA: Diagnosis present

## 2020-10-05 DIAGNOSIS — R188 Other ascites: Secondary | ICD-10-CM | POA: Insufficient documentation

## 2020-10-05 DIAGNOSIS — Y92009 Unspecified place in unspecified non-institutional (private) residence as the place of occurrence of the external cause: Secondary | ICD-10-CM

## 2020-10-05 DIAGNOSIS — K922 Gastrointestinal hemorrhage, unspecified: Secondary | ICD-10-CM | POA: Diagnosis not present

## 2020-10-05 DIAGNOSIS — K222 Esophageal obstruction: Secondary | ICD-10-CM | POA: Diagnosis present

## 2020-10-05 DIAGNOSIS — R55 Syncope and collapse: Secondary | ICD-10-CM | POA: Diagnosis not present

## 2020-10-05 DIAGNOSIS — K921 Melena: Secondary | ICD-10-CM | POA: Diagnosis not present

## 2020-10-05 DIAGNOSIS — N179 Acute kidney failure, unspecified: Secondary | ICD-10-CM | POA: Diagnosis present

## 2020-10-05 DIAGNOSIS — Z853 Personal history of malignant neoplasm of breast: Secondary | ICD-10-CM

## 2020-10-05 DIAGNOSIS — Z6830 Body mass index (BMI) 30.0-30.9, adult: Secondary | ICD-10-CM

## 2020-10-05 DIAGNOSIS — Z9842 Cataract extraction status, left eye: Secondary | ICD-10-CM

## 2020-10-05 DIAGNOSIS — K7460 Unspecified cirrhosis of liver: Secondary | ICD-10-CM | POA: Insufficient documentation

## 2020-10-05 DIAGNOSIS — Z171 Estrogen receptor negative status [ER-]: Secondary | ICD-10-CM | POA: Insufficient documentation

## 2020-10-05 DIAGNOSIS — Z8249 Family history of ischemic heart disease and other diseases of the circulatory system: Secondary | ICD-10-CM

## 2020-10-05 DIAGNOSIS — I350 Nonrheumatic aortic (valve) stenosis: Secondary | ICD-10-CM | POA: Diagnosis present

## 2020-10-05 DIAGNOSIS — Z9841 Cataract extraction status, right eye: Secondary | ICD-10-CM

## 2020-10-05 DIAGNOSIS — K319 Disease of stomach and duodenum, unspecified: Secondary | ICD-10-CM | POA: Diagnosis present

## 2020-10-05 DIAGNOSIS — K2960 Other gastritis without bleeding: Secondary | ICD-10-CM | POA: Diagnosis present

## 2020-10-05 DIAGNOSIS — Z96653 Presence of artificial knee joint, bilateral: Secondary | ICD-10-CM | POA: Diagnosis present

## 2020-10-05 DIAGNOSIS — K449 Diaphragmatic hernia without obstruction or gangrene: Secondary | ICD-10-CM | POA: Diagnosis present

## 2020-10-05 DIAGNOSIS — Z880 Allergy status to penicillin: Secondary | ICD-10-CM

## 2020-10-05 DIAGNOSIS — E876 Hypokalemia: Secondary | ICD-10-CM | POA: Diagnosis present

## 2020-10-05 DIAGNOSIS — R948 Abnormal results of function studies of other organs and systems: Secondary | ICD-10-CM | POA: Insufficient documentation

## 2020-10-05 DIAGNOSIS — D509 Iron deficiency anemia, unspecified: Secondary | ICD-10-CM | POA: Insufficient documentation

## 2020-10-05 DIAGNOSIS — Z961 Presence of intraocular lens: Secondary | ICD-10-CM | POA: Diagnosis present

## 2020-10-05 DIAGNOSIS — K7291 Hepatic failure, unspecified with coma: Secondary | ICD-10-CM | POA: Diagnosis not present

## 2020-10-05 DIAGNOSIS — Z66 Do not resuscitate: Secondary | ICD-10-CM | POA: Diagnosis present

## 2020-10-05 DIAGNOSIS — E041 Nontoxic single thyroid nodule: Secondary | ICD-10-CM | POA: Diagnosis present

## 2020-10-05 DIAGNOSIS — D62 Acute posthemorrhagic anemia: Secondary | ICD-10-CM | POA: Diagnosis present

## 2020-10-05 DIAGNOSIS — I959 Hypotension, unspecified: Secondary | ICD-10-CM | POA: Diagnosis present

## 2020-10-05 DIAGNOSIS — Z515 Encounter for palliative care: Secondary | ICD-10-CM

## 2020-10-05 DIAGNOSIS — I8510 Secondary esophageal varices without bleeding: Secondary | ICD-10-CM | POA: Diagnosis present

## 2020-10-05 DIAGNOSIS — E440 Moderate protein-calorie malnutrition: Secondary | ICD-10-CM | POA: Diagnosis present

## 2020-10-05 DIAGNOSIS — Z9851 Tubal ligation status: Secondary | ICD-10-CM

## 2020-10-05 DIAGNOSIS — Z79899 Other long term (current) drug therapy: Secondary | ICD-10-CM

## 2020-10-05 DIAGNOSIS — G9341 Metabolic encephalopathy: Secondary | ICD-10-CM | POA: Diagnosis present

## 2020-10-05 DIAGNOSIS — Z7982 Long term (current) use of aspirin: Secondary | ICD-10-CM

## 2020-10-05 DIAGNOSIS — R000 Tachycardia, unspecified: Secondary | ICD-10-CM | POA: Diagnosis not present

## 2020-10-05 DIAGNOSIS — Z20822 Contact with and (suspected) exposure to covid-19: Secondary | ICD-10-CM | POA: Diagnosis present

## 2020-10-05 DIAGNOSIS — N189 Chronic kidney disease, unspecified: Secondary | ICD-10-CM | POA: Insufficient documentation

## 2020-10-05 DIAGNOSIS — K2100 Gastro-esophageal reflux disease with esophagitis, without bleeding: Secondary | ICD-10-CM | POA: Diagnosis present

## 2020-10-05 DIAGNOSIS — I129 Hypertensive chronic kidney disease with stage 1 through stage 4 chronic kidney disease, or unspecified chronic kidney disease: Secondary | ICD-10-CM | POA: Insufficient documentation

## 2020-10-05 DIAGNOSIS — I10 Essential (primary) hypertension: Secondary | ICD-10-CM | POA: Diagnosis present

## 2020-10-05 DIAGNOSIS — K7200 Acute and subacute hepatic failure without coma: Secondary | ICD-10-CM | POA: Diagnosis present

## 2020-10-05 LAB — CBC WITH DIFFERENTIAL/PLATELET
Abs Immature Granulocytes: 0.02 10*3/uL (ref 0.00–0.07)
Basophils Absolute: 0.1 10*3/uL (ref 0.0–0.1)
Basophils Relative: 1 %
Eosinophils Absolute: 0.4 10*3/uL (ref 0.0–0.5)
Eosinophils Relative: 5 %
HCT: 30.5 % — ABNORMAL LOW (ref 36.0–46.0)
Hemoglobin: 10.3 g/dL — ABNORMAL LOW (ref 12.0–15.0)
Immature Granulocytes: 0 %
Lymphocytes Relative: 26 %
Lymphs Abs: 2.3 10*3/uL (ref 0.7–4.0)
MCH: 34.7 pg — ABNORMAL HIGH (ref 26.0–34.0)
MCHC: 33.8 g/dL (ref 30.0–36.0)
MCV: 102.7 fL — ABNORMAL HIGH (ref 80.0–100.0)
Monocytes Absolute: 1.2 10*3/uL — ABNORMAL HIGH (ref 0.1–1.0)
Monocytes Relative: 13 %
Neutro Abs: 5 10*3/uL (ref 1.7–7.7)
Neutrophils Relative %: 55 %
Platelets: 202 10*3/uL (ref 150–400)
RBC: 2.97 MIL/uL — ABNORMAL LOW (ref 3.87–5.11)
RDW: 17 % — ABNORMAL HIGH (ref 11.5–15.5)
WBC: 9.1 10*3/uL (ref 4.0–10.5)
nRBC: 0 % (ref 0.0–0.2)

## 2020-10-05 LAB — COMPREHENSIVE METABOLIC PANEL
ALT: 24 U/L (ref 0–44)
AST: 46 U/L — ABNORMAL HIGH (ref 15–41)
Albumin: 2.8 g/dL — ABNORMAL LOW (ref 3.5–5.0)
Alkaline Phosphatase: 155 U/L — ABNORMAL HIGH (ref 38–126)
Anion gap: 10 (ref 5–15)
BUN: 21 mg/dL (ref 8–23)
CO2: 20 mmol/L — ABNORMAL LOW (ref 22–32)
Calcium: 8.7 mg/dL — ABNORMAL LOW (ref 8.9–10.3)
Chloride: 105 mmol/L (ref 98–111)
Creatinine, Ser: 1.18 mg/dL — ABNORMAL HIGH (ref 0.44–1.00)
GFR, Estimated: 44 mL/min — ABNORMAL LOW (ref >60)
Glucose, Bld: 104 mg/dL — ABNORMAL HIGH (ref 70–99)
Potassium: 3.4 mmol/L — ABNORMAL LOW (ref 3.5–5.1)
Sodium: 135 mmol/L (ref 135–145)
Total Bilirubin: 1.4 mg/dL — ABNORMAL HIGH (ref 0.3–1.2)
Total Protein: 6.1 g/dL — ABNORMAL LOW (ref 6.5–8.1)

## 2020-10-05 LAB — MAGNESIUM: Magnesium: 1.7 mg/dL (ref 1.7–2.4)

## 2020-10-05 MED ORDER — SODIUM CHLORIDE 0.9 % IV SOLN
10.0000 mg | Freq: Once | INTRAVENOUS | Status: AC
  Administered 2020-10-05: 10 mg via INTRAVENOUS
  Filled 2020-10-05: qty 10

## 2020-10-05 MED ORDER — ACETAMINOPHEN 325 MG PO TABS
650.0000 mg | ORAL_TABLET | Freq: Once | ORAL | Status: AC
  Administered 2020-10-05: 650 mg via ORAL
  Filled 2020-10-05: qty 2

## 2020-10-05 MED ORDER — HEPARIN SOD (PORK) LOCK FLUSH 100 UNIT/ML IV SOLN
500.0000 [IU] | Freq: Once | INTRAVENOUS | Status: AC | PRN
  Administered 2020-10-05: 500 [IU]

## 2020-10-05 MED ORDER — FAM-TRASTUZUMAB DERUXTECAN-NXKI CHEMO 100 MG IV SOLR
3.2000 mg/kg | Freq: Once | INTRAVENOUS | Status: AC
  Administered 2020-10-05: 226 mg via INTRAVENOUS
  Filled 2020-10-05: qty 11.3

## 2020-10-05 MED ORDER — DEXTROSE 5 % IV SOLN: Freq: Once | INTRAVENOUS | Status: AC

## 2020-10-05 MED ORDER — PALONOSETRON HCL INJECTION 0.25 MG/5ML
0.2500 mg | Freq: Once | INTRAVENOUS | Status: AC
  Administered 2020-10-05: 0.25 mg via INTRAVENOUS
  Filled 2020-10-05: qty 5

## 2020-10-05 MED ORDER — DIPHENHYDRAMINE HCL 25 MG PO CAPS
50.0000 mg | ORAL_CAPSULE | Freq: Once | ORAL | Status: AC
  Administered 2020-10-05: 50 mg via ORAL
  Filled 2020-10-05: qty 2

## 2020-10-05 NOTE — Progress Notes (Signed)
Do not increase dose as not tolerated previously.  T.O Dr [name] Henreitta Leber, PharmD

## 2020-10-05 NOTE — Progress Notes (Signed)
JALEI SHIBLEY presents today for [employer]. Pt denies any new changes or symptoms since last treatment. Lab results and vitals have been reviewed and are stable and within parameters for treatment. Proceeding with treatment today as planned.  Infusions tolerated without incident or complaint. VSS upon completion of treatment. Port flushed and deaccessed per protocol, see MAR and IV flowsheet for details. Discharged in satisfactory condition with follow up instructions.

## 2020-10-05 NOTE — Patient Instructions (Signed)
[facility] at [facility] Discharge Instructions  Labs drawn from portacath today   Thank you for choosing [facility] at [facility] to provide your oncology and hematology care.  To afford each patient quality time with our provider, please arrive at least 15 minutes before your scheduled appointment time.   If you have a lab appointment with the Cancer Center please come in thru the Main Entrance and check in at the main information desk.  You need to re-schedule your appointment should you arrive 10 or more minutes late.  We strive to give you quality time with our providers, and arriving late affects you and other patients whose appointments are after yours.  Also, if you no show three or more times for appointments you may be dismissed from the clinic at the providers discretion.     Again, thank you for choosing [facility].  Our hope is that these requests will decrease the amount of time that you wait before being seen by our physicians.       _____________________________________________________________  Should you have questions after your visit to [facility], please contact our office at [phone] and follow the prompts.  Our office hours are 8:00 a.m. and 4:30 p.m. Monday - Friday.  Please note that voicemails left after 4:00 p.m. may not be returned until the following business day.  We are closed weekends and major holidays.  You do have access to a nurse 24-7, just call the main number to the clinic [phone] and do not press any options, hold on the line and a nurse will answer the phone.    For prescription refill requests, have your pharmacy contact our office and allow 72 hours.    Due to Covid, you will need to wear a mask upon entering the hospital. If you do not have a mask, a mask will be given to you at the Main Entrance upon arrival. For doctor visits, patients may have 1 support person age 18  or older with them. For treatment visits, patients can not have anyone with them due to social distancing guidelines and our immunocompromised population.     

## 2020-10-06 DIAGNOSIS — R42 Dizziness and giddiness: Secondary | ICD-10-CM | POA: Diagnosis not present

## 2020-10-06 DIAGNOSIS — R197 Diarrhea, unspecified: Secondary | ICD-10-CM | POA: Diagnosis not present

## 2020-10-06 DIAGNOSIS — R0902 Hypoxemia: Secondary | ICD-10-CM | POA: Diagnosis not present

## 2020-10-06 DIAGNOSIS — R4020 Unspecified coma: Secondary | ICD-10-CM | POA: Diagnosis not present

## 2020-10-06 DIAGNOSIS — I959 Hypotension, unspecified: Secondary | ICD-10-CM | POA: Diagnosis not present

## 2020-10-07 ENCOUNTER — Other Ambulatory Visit: Payer: Self-pay

## 2020-10-07 ENCOUNTER — Inpatient Hospital Stay (HOSPITAL_COMMUNITY): Payer: Medicare HMO | Admitting: Anesthesiology

## 2020-10-07 ENCOUNTER — Encounter (HOSPITAL_COMMUNITY): Payer: Self-pay

## 2020-10-07 ENCOUNTER — Inpatient Hospital Stay (HOSPITAL_COMMUNITY)
Admission: EM | Admit: 2020-10-07 | Discharge: 2020-10-13 | DRG: 377 | Disposition: A | Discharge status: Home Health | Payer: Medicare HMO | Attending: Family Medicine | Admitting: Family Medicine

## 2020-10-07 ENCOUNTER — Inpatient Hospital Stay (HOSPITAL_COMMUNITY): Payer: Medicare HMO

## 2020-10-07 ENCOUNTER — Encounter (HOSPITAL_COMMUNITY)
Admission: EM | Disposition: A | Discharge status: Home Health | Payer: Self-pay | Source: Home / Self Care | Attending: Internal Medicine

## 2020-10-07 DIAGNOSIS — K922 Gastrointestinal hemorrhage, unspecified: Secondary | ICD-10-CM | POA: Diagnosis not present

## 2020-10-07 DIAGNOSIS — Z20822 Contact with and (suspected) exposure to covid-19: Secondary | ICD-10-CM | POA: Diagnosis not present

## 2020-10-07 DIAGNOSIS — E785 Hyperlipidemia, unspecified: Secondary | ICD-10-CM | POA: Diagnosis present

## 2020-10-07 DIAGNOSIS — K2100 Gastro-esophageal reflux disease with esophagitis, without bleeding: Secondary | ICD-10-CM | POA: Diagnosis not present

## 2020-10-07 DIAGNOSIS — R202 Paresthesia of skin: Secondary | ICD-10-CM

## 2020-10-07 DIAGNOSIS — Z66 Do not resuscitate: Secondary | ICD-10-CM | POA: Diagnosis not present

## 2020-10-07 DIAGNOSIS — K222 Esophageal obstruction: Secondary | ICD-10-CM | POA: Diagnosis not present

## 2020-10-07 DIAGNOSIS — E440 Moderate protein-calorie malnutrition: Secondary | ICD-10-CM | POA: Diagnosis not present

## 2020-10-07 DIAGNOSIS — I350 Nonrheumatic aortic (valve) stenosis: Secondary | ICD-10-CM

## 2020-10-07 DIAGNOSIS — R4182 Altered mental status, unspecified: Secondary | ICD-10-CM | POA: Diagnosis not present

## 2020-10-07 DIAGNOSIS — C50411 Malignant neoplasm of upper-outer quadrant of right female breast: Secondary | ICD-10-CM | POA: Diagnosis present

## 2020-10-07 DIAGNOSIS — R55 Syncope and collapse: Secondary | ICD-10-CM | POA: Diagnosis present

## 2020-10-07 DIAGNOSIS — K7290 Hepatic failure, unspecified without coma: Secondary | ICD-10-CM | POA: Diagnosis not present

## 2020-10-07 DIAGNOSIS — R000 Tachycardia, unspecified: Secondary | ICD-10-CM | POA: Diagnosis not present

## 2020-10-07 DIAGNOSIS — K7682 Hepatic encephalopathy: Secondary | ICD-10-CM

## 2020-10-07 DIAGNOSIS — Z515 Encounter for palliative care: Secondary | ICD-10-CM | POA: Diagnosis not present

## 2020-10-07 DIAGNOSIS — N179 Acute kidney failure, unspecified: Secondary | ICD-10-CM | POA: Diagnosis present

## 2020-10-07 DIAGNOSIS — Z171 Estrogen receptor negative status [ER-]: Secondary | ICD-10-CM

## 2020-10-07 DIAGNOSIS — I10 Essential (primary) hypertension: Secondary | ICD-10-CM | POA: Diagnosis not present

## 2020-10-07 DIAGNOSIS — K319 Disease of stomach and duodenum, unspecified: Secondary | ICD-10-CM | POA: Diagnosis present

## 2020-10-07 DIAGNOSIS — K921 Melena: Secondary | ICD-10-CM | POA: Diagnosis not present

## 2020-10-07 DIAGNOSIS — S83289A Other tear of lateral meniscus, current injury, unspecified knee, initial encounter: Secondary | ICD-10-CM

## 2020-10-07 DIAGNOSIS — K7460 Unspecified cirrhosis of liver: Secondary | ICD-10-CM

## 2020-10-07 DIAGNOSIS — W010XXA Fall on same level from slipping, tripping and stumbling without subsequent striking against object, initial encounter: Secondary | ICD-10-CM | POA: Diagnosis present

## 2020-10-07 DIAGNOSIS — K920 Hematemesis: Secondary | ICD-10-CM | POA: Diagnosis not present

## 2020-10-07 DIAGNOSIS — G9341 Metabolic encephalopathy: Secondary | ICD-10-CM | POA: Diagnosis not present

## 2020-10-07 DIAGNOSIS — K7291 Hepatic failure, unspecified with coma: Secondary | ICD-10-CM | POA: Diagnosis not present

## 2020-10-07 DIAGNOSIS — K7200 Acute and subacute hepatic failure without coma: Secondary | ICD-10-CM | POA: Diagnosis not present

## 2020-10-07 DIAGNOSIS — D649 Anemia, unspecified: Secondary | ICD-10-CM | POA: Diagnosis present

## 2020-10-07 DIAGNOSIS — R001 Bradycardia, unspecified: Secondary | ICD-10-CM | POA: Diagnosis not present

## 2020-10-07 DIAGNOSIS — E041 Nontoxic single thyroid nodule: Secondary | ICD-10-CM | POA: Diagnosis present

## 2020-10-07 DIAGNOSIS — Y92009 Unspecified place in unspecified non-institutional (private) residence as the place of occurrence of the external cause: Secondary | ICD-10-CM | POA: Diagnosis not present

## 2020-10-07 DIAGNOSIS — D62 Acute posthemorrhagic anemia: Secondary | ICD-10-CM | POA: Diagnosis not present

## 2020-10-07 DIAGNOSIS — Z96653 Presence of artificial knee joint, bilateral: Secondary | ICD-10-CM | POA: Diagnosis present

## 2020-10-07 DIAGNOSIS — R188 Other ascites: Secondary | ICD-10-CM | POA: Diagnosis not present

## 2020-10-07 DIAGNOSIS — I959 Hypotension, unspecified: Secondary | ICD-10-CM | POA: Diagnosis present

## 2020-10-07 DIAGNOSIS — Z7189 Other specified counseling: Secondary | ICD-10-CM | POA: Diagnosis not present

## 2020-10-07 DIAGNOSIS — D72829 Elevated white blood cell count, unspecified: Secondary | ICD-10-CM | POA: Diagnosis present

## 2020-10-07 DIAGNOSIS — I252 Old myocardial infarction: Secondary | ICD-10-CM | POA: Diagnosis not present

## 2020-10-07 DIAGNOSIS — Z961 Presence of intraocular lens: Secondary | ICD-10-CM | POA: Diagnosis present

## 2020-10-07 DIAGNOSIS — K3189 Other diseases of stomach and duodenum: Secondary | ICD-10-CM | POA: Diagnosis not present

## 2020-10-07 DIAGNOSIS — I8510 Secondary esophageal varices without bleeding: Secondary | ICD-10-CM | POA: Diagnosis present

## 2020-10-07 DIAGNOSIS — K449 Diaphragmatic hernia without obstruction or gangrene: Secondary | ICD-10-CM | POA: Diagnosis not present

## 2020-10-07 DIAGNOSIS — I8500 Esophageal varices without bleeding: Secondary | ICD-10-CM | POA: Diagnosis not present

## 2020-10-07 HISTORY — PX: ESOPHAGOGASTRODUODENOSCOPY (EGD) WITH PROPOFOL: SHX5813

## 2020-10-07 LAB — CBC WITH DIFFERENTIAL/PLATELET
Abs Immature Granulocytes: 0.1 10*3/uL — ABNORMAL HIGH (ref 0.00–0.07)
Basophils Absolute: 0 10*3/uL (ref 0.0–0.1)
Basophils Relative: 0 %
Eosinophils Absolute: 0 10*3/uL (ref 0.0–0.5)
Eosinophils Relative: 0 %
HCT: 19.8 % — ABNORMAL LOW (ref 36.0–46.0)
Hemoglobin: 6.5 g/dL — CL (ref 12.0–15.0)
Immature Granulocytes: 1 %
Lymphocytes Relative: 9 %
Lymphs Abs: 1.1 10*3/uL (ref 0.7–4.0)
MCH: 34.8 pg — ABNORMAL HIGH (ref 26.0–34.0)
MCHC: 32.8 g/dL (ref 30.0–36.0)
MCV: 105.9 fL — ABNORMAL HIGH (ref 80.0–100.0)
Monocytes Absolute: 1.8 10*3/uL — ABNORMAL HIGH (ref 0.1–1.0)
Monocytes Relative: 14 %
Neutro Abs: 10.1 10*3/uL — ABNORMAL HIGH (ref 1.7–7.7)
Neutrophils Relative %: 76 %
Platelets: 212 10*3/uL (ref 150–400)
RBC: 1.87 MIL/uL — ABNORMAL LOW (ref 3.87–5.11)
RDW: 17.2 % — ABNORMAL HIGH (ref 11.5–15.5)
WBC: 13.2 10*3/uL — ABNORMAL HIGH (ref 4.0–10.5)
nRBC: 0 % (ref 0.0–0.2)

## 2020-10-07 LAB — COMPREHENSIVE METABOLIC PANEL
ALT: 21 U/L (ref 0–44)
ALT: 23 U/L (ref 0–44)
AST: 39 U/L (ref 15–41)
AST: 42 U/L — ABNORMAL HIGH (ref 15–41)
Albumin: 2.3 g/dL — ABNORMAL LOW (ref 3.5–5.0)
Albumin: 2.4 g/dL — ABNORMAL LOW (ref 3.5–5.0)
Alkaline Phosphatase: 104 U/L (ref 38–126)
Alkaline Phosphatase: 125 U/L (ref 38–126)
Anion gap: 15 (ref 5–15)
Anion gap: 9 (ref 5–15)
BUN: 66 mg/dL — ABNORMAL HIGH (ref 8–23)
BUN: 81 mg/dL — ABNORMAL HIGH (ref 8–23)
CO2: 17 mmol/L — ABNORMAL LOW (ref 22–32)
CO2: 20 mmol/L — ABNORMAL LOW (ref 22–32)
Calcium: 8.2 mg/dL — ABNORMAL LOW (ref 8.9–10.3)
Calcium: 8.4 mg/dL — ABNORMAL LOW (ref 8.9–10.3)
Chloride: 104 mmol/L (ref 98–111)
Chloride: 109 mmol/L (ref 98–111)
Creatinine, Ser: 1.4 mg/dL — ABNORMAL HIGH (ref 0.44–1.00)
Creatinine, Ser: 1.47 mg/dL — ABNORMAL HIGH (ref 0.44–1.00)
GFR, Estimated: 34 mL/min — ABNORMAL LOW (ref >60)
GFR, Estimated: 36 mL/min — ABNORMAL LOW (ref >60)
Glucose, Bld: 170 mg/dL — ABNORMAL HIGH (ref 70–99)
Glucose, Bld: 194 mg/dL — ABNORMAL HIGH (ref 70–99)
Potassium: 3.9 mmol/L (ref 3.5–5.1)
Potassium: 4.2 mmol/L (ref 3.5–5.1)
Sodium: 136 mmol/L (ref 135–145)
Sodium: 138 mmol/L (ref 135–145)
Total Bilirubin: 0.8 mg/dL (ref 0.3–1.2)
Total Bilirubin: 1.4 mg/dL — ABNORMAL HIGH (ref 0.3–1.2)
Total Protein: 4.7 g/dL — ABNORMAL LOW (ref 6.5–8.1)
Total Protein: 5.3 g/dL — ABNORMAL LOW (ref 6.5–8.1)

## 2020-10-07 LAB — CBC
HCT: 30.1 % — ABNORMAL LOW (ref 36.0–46.0)
HCT: 28.8 % — ABNORMAL LOW (ref 36.0–46.0)
Hemoglobin: 10.3 g/dL — ABNORMAL LOW (ref 12.0–15.0)
Hemoglobin: 9.8 g/dL — ABNORMAL LOW (ref 12.0–15.0)
MCH: 32.3 pg (ref 26.0–34.0)
MCH: 31.5 pg (ref 26.0–34.0)
MCHC: 34.2 g/dL (ref 30.0–36.0)
MCHC: 34 g/dL (ref 30.0–36.0)
MCV: 94.4 fL (ref 80.0–100.0)
MCV: 92.6 fL (ref 80.0–100.0)
Platelets: 159 10*3/uL (ref 150–400)
Platelets: 169 10*3/uL (ref 150–400)
RBC: 3.19 MIL/uL — ABNORMAL LOW (ref 3.87–5.11)
RBC: 3.11 MIL/uL — ABNORMAL LOW (ref 3.87–5.11)
RDW: 18.9 % — ABNORMAL HIGH (ref 11.5–15.5)
RDW: 19.5 % — ABNORMAL HIGH (ref 11.5–15.5)
WBC: 16.2 10*3/uL — ABNORMAL HIGH (ref 4.0–10.5)
WBC: 15.3 10*3/uL — ABNORMAL HIGH (ref 4.0–10.5)
nRBC: 0 % (ref 0.0–0.2)
nRBC: 0 % (ref 0.0–0.2)

## 2020-10-07 LAB — PREPARE RBC (CROSSMATCH)

## 2020-10-07 LAB — GLUCOSE, CAPILLARY: Glucose-Capillary: 129 mg/dL — ABNORMAL HIGH (ref 70–99)

## 2020-10-07 LAB — PROTIME-INR
INR: 1.3 — ABNORMAL HIGH (ref 0.8–1.2)
Prothrombin Time: 16 seconds — ABNORMAL HIGH (ref 11.4–15.2)

## 2020-10-07 LAB — RESP PANEL BY RT-PCR (FLU A&B, COVID) ARPGX2
Influenza A by PCR: NEGATIVE
Influenza B by PCR: NEGATIVE
SARS Coronavirus 2 by RT PCR: NEGATIVE

## 2020-10-07 LAB — TROPONIN I (HIGH SENSITIVITY)
Troponin I (High Sensitivity): 36 ng/L — ABNORMAL HIGH (ref <18)
Troponin I (High Sensitivity): 52 ng/L — ABNORMAL HIGH (ref <18)

## 2020-10-07 LAB — MAGNESIUM: Magnesium: 2.3 mg/dL (ref 1.7–2.4)

## 2020-10-07 LAB — POC OCCULT BLOOD, ED: Fecal Occult Bld: POSITIVE — AB

## 2020-10-07 LAB — AMMONIA: Ammonia: 73 umol/L — ABNORMAL HIGH (ref 9–35)

## 2020-10-07 SURGERY — ESOPHAGOGASTRODUODENOSCOPY (EGD) WITH PROPOFOL
Anesthesia: General

## 2020-10-07 MED ORDER — SODIUM CHLORIDE 0.9 % IV SOLN
80.0000 mg | Freq: Once | INTRAVENOUS | Status: AC
  Administered 2020-10-07: 80 mg via INTRAVENOUS
  Filled 2020-10-07: qty 80

## 2020-10-07 MED ORDER — LIDOCAINE VISCOUS HCL 2 % MT SOLN
OROMUCOSAL | Status: AC
  Filled 2020-10-07: qty 15

## 2020-10-07 MED ORDER — STERILE WATER FOR IRRIGATION IR SOLN
Status: DC | PRN
  Administered 2020-10-07: 500 mL

## 2020-10-07 MED ORDER — SODIUM CHLORIDE 0.9 % IV SOLN
8.0000 mg/h | INTRAVENOUS | Status: AC
  Administered 2020-10-07 – 2020-10-09 (×5): 8 mg/h via INTRAVENOUS
  Filled 2020-10-07: qty 80
  Filled 2020-10-07 (×3): qty 35.56
  Filled 2020-10-07 (×4): qty 80

## 2020-10-07 MED ORDER — SODIUM CHLORIDE 0.9 % IV SOLN
50.0000 ug/h | INTRAVENOUS | Status: DC
  Administered 2020-10-07 – 2020-10-08 (×3): 50 ug/h via INTRAVENOUS
  Filled 2020-10-07 (×10): qty 1

## 2020-10-07 MED ORDER — SODIUM CHLORIDE 0.9 % IV SOLN: 10.0000 mL/h | Freq: Once | INTRAVENOUS | Status: DC

## 2020-10-07 MED ORDER — LACTATED RINGERS IV SOLN: INTRAVENOUS | Status: DC | PRN

## 2020-10-07 MED ORDER — ACETAMINOPHEN 650 MG RE SUPP: 650.0000 mg | Freq: Four times a day (QID) | RECTAL | Status: DC | PRN

## 2020-10-07 MED ORDER — ONDANSETRON HCL 4 MG PO TABS: 4.0000 mg | ORAL_TABLET | Freq: Four times a day (QID) | ORAL | Status: DC | PRN

## 2020-10-07 MED ORDER — DEXTROSE 5 % IV SOLN: INTRAVENOUS | Status: DC

## 2020-10-07 MED ORDER — SODIUM CHLORIDE 0.9 % IV SOLN
10.0000 mL/h | Freq: Once | INTRAVENOUS | Status: AC
  Administered 2020-10-07: 10 mL/h via INTRAVENOUS

## 2020-10-07 MED ORDER — PANTOPRAZOLE SODIUM 40 MG IV SOLR
40.0000 mg | Freq: Two times a day (BID) | INTRAVENOUS | Status: DC
  Administered 2020-10-10 – 2020-10-11 (×3): 40 mg via INTRAVENOUS
  Filled 2020-10-07 (×4): qty 40

## 2020-10-07 MED ORDER — PANTOPRAZOLE SODIUM 40 MG IV SOLR: 40.0000 mg | Freq: Once | INTRAVENOUS | Status: DC

## 2020-10-07 MED ORDER — ESMOLOL HCL 100 MG/10ML IV SOLN
INTRAVENOUS | Status: DC | PRN
  Administered 2020-10-07: 20 mg via INTRAVENOUS

## 2020-10-07 MED ORDER — ETOMIDATE 2 MG/ML IV SOLN
INTRAVENOUS | Status: AC
  Filled 2020-10-07: qty 10

## 2020-10-07 MED ORDER — SODIUM CHLORIDE 0.9 % IV SOLN
2.0000 g | INTRAVENOUS | Status: DC
  Administered 2020-10-07 – 2020-10-08 (×2): 2 g via INTRAVENOUS
  Administered 2020-10-09: 1 g via INTRAVENOUS
  Filled 2020-10-07 (×3): qty 20

## 2020-10-07 MED ORDER — SODIUM CHLORIDE 0.9 % IV BOLUS
500.0000 mL | Freq: Once | INTRAVENOUS | Status: AC
  Administered 2020-10-07: 500 mL via INTRAVENOUS

## 2020-10-07 MED ORDER — LIDOCAINE HCL (CARDIAC) PF 100 MG/5ML IV SOSY
PREFILLED_SYRINGE | INTRAVENOUS | Status: DC | PRN
  Administered 2020-10-07: 60 mg via INTRAVENOUS

## 2020-10-07 MED ORDER — SODIUM CHLORIDE 0.9 % IV SOLN
10.0000 mL/h | Freq: Once | INTRAVENOUS | Status: AC
  Administered 2020-10-11: 10 mL/h via INTRAVENOUS

## 2020-10-07 MED ORDER — ACETAMINOPHEN 325 MG PO TABS: 650.0000 mg | ORAL_TABLET | Freq: Four times a day (QID) | ORAL | Status: DC | PRN

## 2020-10-07 MED ORDER — SODIUM CHLORIDE 0.9 % IV SOLN
250.0000 mg | Freq: Once | INTRAVENOUS | Status: AC
  Administered 2020-10-07: 250 mg via INTRAVENOUS
  Filled 2020-10-07: qty 5

## 2020-10-07 MED ORDER — PROPOFOL 500 MG/50ML IV EMUL
INTRAVENOUS | Status: DC | PRN
  Administered 2020-10-07: 75 ug/kg/min via INTRAVENOUS

## 2020-10-07 MED ORDER — ONDANSETRON HCL 4 MG/2ML IJ SOLN
4.0000 mg | Freq: Once | INTRAMUSCULAR | Status: AC
  Administered 2020-10-07: 4 mg via INTRAVENOUS
  Filled 2020-10-07: qty 2

## 2020-10-07 MED ORDER — FENTANYL CITRATE (PF) 100 MCG/2ML IJ SOLN
INTRAMUSCULAR | Status: AC
  Filled 2020-10-07: qty 2

## 2020-10-07 MED ORDER — ETOMIDATE 2 MG/ML IV SOLN
INTRAVENOUS | Status: DC | PRN
  Administered 2020-10-07 (×2): 2 mg via INTRAVENOUS
  Administered 2020-10-07: 4 mg via INTRAVENOUS

## 2020-10-07 MED ORDER — METOPROLOL TARTRATE 5 MG/5ML IV SOLN
INTRAVENOUS | Status: AC
  Filled 2020-10-07: qty 5

## 2020-10-07 MED ORDER — LIDOCAINE HCL (PF) 2 % IJ SOLN
INTRAMUSCULAR | Status: AC
  Filled 2020-10-07: qty 5

## 2020-10-07 MED ORDER — LACTULOSE ENEMA
300.0000 mL | Freq: Once | ORAL | Status: AC
  Administered 2020-10-08: 300 mL via RECTAL
  Filled 2020-10-07: qty 300

## 2020-10-07 MED ORDER — SODIUM CHLORIDE 0.9 % IV SOLN: INTRAVENOUS | Status: DC

## 2020-10-07 MED ORDER — METOPROLOL TARTRATE 5 MG/5ML IV SOLN
INTRAVENOUS | Status: DC | PRN
  Administered 2020-10-07 (×2): 1 mg via INTRAVENOUS

## 2020-10-07 MED ORDER — MORPHINE SULFATE (PF) 2 MG/ML IV SOLN: 2.0000 mg | INTRAVENOUS | Status: DC | PRN

## 2020-10-07 MED ORDER — PHENYLEPHRINE 40 MCG/ML (10ML) SYRINGE FOR IV PUSH (FOR BLOOD PRESSURE SUPPORT)
PREFILLED_SYRINGE | INTRAVENOUS | Status: DC | PRN
  Administered 2020-10-07 (×3): 80 ug via INTRAVENOUS

## 2020-10-07 MED ORDER — OCTREOTIDE ACETATE 500 MCG/ML IJ SOLN
INTRAMUSCULAR | Status: AC
  Filled 2020-10-07: qty 1

## 2020-10-07 MED ORDER — ONDANSETRON HCL 4 MG/2ML IJ SOLN: 4.0000 mg | Freq: Four times a day (QID) | INTRAMUSCULAR | Status: DC | PRN

## 2020-10-07 MED ORDER — PROPOFOL 10 MG/ML IV BOLUS
INTRAVENOUS | Status: AC
  Filled 2020-10-07: qty 20

## 2020-10-07 NOTE — ED Notes (Signed)
hgb 6.5 reported to edp. See orders

## 2020-10-07 NOTE — Op Note (Signed)
[facility] Patient Name: [name] Procedure Date: 10/07/2020 12:24 PM MRN: [id number] Date of Birth: 1930-06-28 Attending MD: Norvel Richards , MD CSN: [id number] Age: 86 Admit Type: Outpatient Procedure:                Upper GI endoscopy Indications:              Melena Providers:                Norvel Richards, MD, Lurline Del, RN, Aram Candela Referring MD:              Medicines:                Propofol per Anesthesia; IV erythromycin prior to                            the procedure as a prokinetic agent Complications:            No immediate complications. Estimated Blood Loss:     Estimated blood loss was minimal. Procedure:                Pre-Anesthesia Assessment:                           - Prior to the procedure, a History and Physical                            was performed, and patient medications and                            allergies were reviewed. The patient's tolerance of                            previous anesthesia was also reviewed. The risks                            and benefits of the procedure and the sedation                            options and risks were discussed with the patient.                            All questions were answered, and informed consent                            was obtained. Prior Anticoagulants: The patient has                            taken no previous anticoagulant or antiplatelet                            agents. ASA Grade Assessment: IV - A patient with  severe systemic disease that is a constant threat                            to life. After reviewing the risks and benefits,                            the patient was deemed in satisfactory condition to                            undergo the procedure.                           After obtaining informed consent, the endoscope was                            passed under direct vision. Throughout the                             procedure, the patient's blood pressure, pulse, and                            oxygen saturations were monitored continuously. The                            [id number]-H190 ([id number]) scope was introduced through the                            mouth, and advanced to the second part of duodenum.                            The upper GI endoscopy was accomplished without                            difficulty. The patient tolerated the procedure                            well. Scope In: 1:47:23 PM Scope Out: 2:02:55 PM Total Procedure Duration: 0 hours 15 minutes 32 seconds  Findings:      2 columns grade 2/grade 3 esophageal varices extending 8 to 9 cm up into       the distal esophagus. 2 to 3 cm linear segment of excoriated, eroded       mucosa, overlying these variceal columns -distal esophagus. Appearance       more consistent with reflux related injury or trauma (Mallory-Weiss type       lesion). Proximal gastric cavity had a large amount of tenacious old       blood contained within it. This could not be washed away. The proximal       gastric cavity could not be completely visualized. Moderate size hiatal       hernia present. Body and antrum looked good aside from a 4 mm, innocent       appearing, antral erosion; pylorus was patent. Examination of the bulb       and second portion revealed no abnormalities Impression:               -  Esophageal varices as described without bleeding                            stigmata. Distal esophageal erosions/excoriations                            overlying varices (opposite walls). More consistent                            with trauma/reflux than variceal bleeding stigmata.                           -Stomach incompletely seen as described above due                            to presence of old blood.. First and second portion                            of the duodenum appeared normal.                           -No actively  bleeding lesions seen anywhere in the                            upper GI tract. Cannot rule out an occult lesion in                            the proximal stomach. Moderate Sedation:      Moderate (conscious) sedation was personally administered by an       anesthesia professional. The following parameters were monitored: oxygen       saturation, heart rate, blood pressure, respiratory rate, EKG, adequacy       of pulmonary ventilation, and response to care. Recommendation:           - Clear liquid diet.                           - Continue present medications.                           - Repeat upper endoscopy in 2 days due to                            incomplete examination today. Continue PPI infusion                            for now. Add Carafate suspension 1 g before meals                            and nightly. Follow H&H. Because she is a cirrhotic                            and GI bleeding, antibiotics should be initiated.                           -  Initiate octreotide infusion for now as a                            prophylactic measure.                           -Further recommendations to follow. I have                            discussed my findings and recommendations with her                            husband, Ron, in short stay. Procedure Code(s):        --- Professional ---                           [phone], Esophagogastroduodenoscopy, flexible,                            transoral; diagnostic, including collection of                            specimen(s) by brushing or washing, when performed                            (separate procedure) Diagnosis Code(s):        --- Professional ---                           K92.1, Melena (includes Hematochezia) CPT copyright 2019 American Medical Association. All rights reserved. The codes documented in this report are preliminary and upon coder review may  be revised to meet current compliance requirements. Cristopher Estimable. Maisa Bedingfield,  MD Norvel Richards, MD 10/07/2020 2:49:17 PM This report has been signed electronically. Number of Addenda: 0

## 2020-10-07 NOTE — ED Notes (Signed)
Pt saturations 89% on room air; placed on 2l [place] now 98%

## 2020-10-07 NOTE — H&P (View-Only) (Signed)
Discussed with Drs. Ghimire and [place].  Mildly elevated highly sensitive troponin I noted.  Echo in June indicated severe aortic stenosis.  No recent cardiology input. Patient presents with a life-threatening GI bleed.  Now resuscitated and having no cardiopulmonary symptoms. Dr. Sloan Leiter feels it is indicated to proceed with an upper endoscopy without further evaluation.  Under these circumstances, I agree.  Further recommendations to follow after completion of EGD.

## 2020-10-07 NOTE — ED Notes (Signed)
Assumed care of pt, pt attempting to climb out of bed, sig other at bedside, repositioned pt in bed, pt is confused, pt follows commands, pt no resting comfortably, family remains at bedside.  Pt on cardiac monitor and O2 sat monitor, replaced [place], pt satting high 90s

## 2020-10-07 NOTE — ED Triage Notes (Addendum)
EMS called out for fall, upon arrival pt was on floor, lying on back. EMS noted dark red feces. Pt had unresponsive episode when sitting up, lied back down and pt came to. 2nd attempt at sitting up, pt was ok. Pt alert and oriented x 4 per EMS. Pt has no complaints of pain at this time.

## 2020-10-07 NOTE — ED Provider Notes (Signed)
[facility] Provider Note   CSN: [id number] Arrival date & time: 10/07/20  0003     History Chief Complaint  Patient presents with  . Fall    [name] is a 86 y.o. female.  Patient is an 86 year old female with past medical history of breast cancer, hypertension, GERD.  Patient brought by EMS for evaluation of a fall and suspected GI bleed.  Patient tells me that she lost her balance and slid to the floor this evening.  She was unable to be helped up by her husband who in turn called 911.  I am told patient was found on the floor surrounded by dark red-colored feces.  I have also told she experienced some sort of syncopal or unresponsive episode when she was initially sat up, however this resolved.  Patient denies to me she is currently experiencing any pain.  She does appear to have dark stool.  She denies to me she is experiencing any pain.  The history is provided by the patient.  Fall This is a new problem. The current episode started less than 1 hour ago. Nothing aggravates the symptoms. Nothing relieves the symptoms. She has tried nothing for the symptoms.       Past Medical History:  Diagnosis Date  . Cancer ([place]) 06/2018   right breast cancer  . GERD (gastroesophageal reflux disease)    occasional   . Hyperlipidemia   . Hypertension    clearance with note Dr [place] Crane on chart  . Pneumonia    2 years ago/ states occ cough with sinus drainage- no fever  . Thyroid nodule    with biopsy- states following medically    Patient Active Problem List   Diagnosis Date Noted  . Goals of care, counseling/discussion 02/15/2019  . Malignant neoplasm of upper-outer quadrant of right breast in female, estrogen receptor negative ([place]) 07/15/2018  . Peripheral polyneuropathy 12/12/2017  . Paresthesia 11/24/2017  . Postop Anemia 12/19/2011  . Postop Hyponatremia 12/17/2011  . OA (osteoarthritis) of knee 12/16/2011  . KNEE, ARTHRITIS, DEGEN./OSTEO 10/10/2010   . TEAR MEDIAL MENISCUS 10/10/2010  . TEAR LATERAL MENISCUS 10/10/2010    Past Surgical History:  Procedure Laterality Date  . CATARACT EXTRACTION W/PHACO  11/30/2012   Procedure: CATARACT EXTRACTION PHACO AND INTRAOCULAR LENS PLACEMENT (IOC);  Surgeon: Williams Che, MD;  Location: [facility];  Service: Ophthalmology;  Laterality: Left;  CDE:11.49  . CATARACT EXTRACTION W/PHACO Right 03/15/2013   Procedure: CATARACT EXTRACTION PHACO AND INTRAOCULAR LENS PLACEMENT (IOC);  Surgeon: Williams Che, MD;  Location: [facility];  Service: Ophthalmology;  Laterality: Right;  CDE 16.15  . COLONOSCOPY    . EXCISION OF SKIN TAG Right 06/17/2016   Procedure: SHAVE OF SKIN TAG RIGHT BUTTOCK;  Surgeon: Aviva Signs, MD;  Location: [facility];  Service: General;  Laterality: Right;  . JOINT REPLACEMENT     left knee  . MASS EXCISION Left 06/17/2016   Procedure: EXCISION SKIN MALIGNANT LESION LEFT BUTTOCK;  Surgeon: Aviva Signs, MD;  Location: [facility];  Service: General;  Laterality: Left;  . PORTACATH PLACEMENT Right 07/28/2018   Procedure: INSERTION PORT-A-CATH WITH ULTRASOUND;  Surgeon: Rolm Bookbinder, MD;  Location: [facility];  Service: General;  Laterality: Right;  . PUNCH BIOPSY OF SKIN Right 07/28/2018   Procedure: PUNCH BIOPSY OF SKIN RIGHT BREAST;  Surgeon: Rolm Bookbinder, MD;  Location: [facility];  Service: General;  Laterality: Right;  . TOTAL KNEE ARTHROPLASTY  12/16/2011  Procedure: TOTAL KNEE ARTHROPLASTY;  Surgeon: Gearlean Alf, MD;  Location: [facility];  Service: Orthopedics;  Laterality: Right;  . TUBAL LIGATION       OB History   No obstetric history on file.     Family History  Problem Relation Age of Onset  . Heart attack Mother   . Bronchitis Father   . Diabetes Sister   . Leukemia Brother   . Lung disease Brother   . Lung disease Sister     Social History   Tobacco Use  . Smoking status: Never Smoker  . Smokeless tobacco: Never Used   Vaping Use  . Vaping Use: Never used  Substance Use Topics  . Alcohol use: No  . Drug use: No    Home Medications Prior to Admission medications   Medication Sig Start Date End Date Taking? Authorizing Provider  acetaminophen (TYLENOL) 325 MG tablet Take 650 mg by mouth every 6 (six) hours as needed.     [provider]  aspirin 325 MG tablet Take 162.5 mg by mouth daily.     [provider]  cetirizine (ZYRTEC) 10 MG tablet Take 10 mg by mouth daily.    [provider]  furosemide (LASIX) 20 MG tablet Take 1 tablet (20 mg total) by mouth daily. 08/08/20   Derek Jack, MD  hydrocortisone 1 % ointment Apply 1 application topically 2 (two) times daily. 09/18/18   Lockamy, Randi L, NP-C  lidocaine-prilocaine (EMLA) cream Apply to port site one hour prior to appointment and cover with plastic wrap. Patient not taking: Reported on 09/14/2020 07/29/18   Derek Jack, MD  Misc. Devices MISC Please provide lymphedema sleeve for right arm 11/03/18   Derek Jack, MD  Multiple Vitamin (MULTIVITAMIN WITH MINERALS) TABS tablet Take 1 tablet by mouth daily.    [provider]  ONETOUCH ULTRA test strip USE 1 STRIP TO CHECK GLUCOSE 2 TO 3 TIMES DAILY 04/21/19   [provider]    Allergies    Penicillins  Review of Systems   Review of Systems  All other systems reviewed and are negative.   Physical Exam Updated Vital Signs BP 117/69   Pulse (!) 103   Resp 18   Ht 5\' 4"  (1.626 m)   Wt 79.3 kg   SpO2 100%   BMI 30.01 kg/m   Physical Exam Vitals and nursing note reviewed.  Constitutional:      General: She is not in acute distress.    Appearance: She is well-developed and well-nourished. She is not diaphoretic.  HENT:     Head: Normocephalic and atraumatic.  Cardiovascular:     Rate and Rhythm: Normal rate and regular rhythm.     Heart sounds: No murmur heard. No friction rub. No gallop.   Pulmonary:     Effort:  Pulmonary effort is normal. No respiratory distress.     Breath sounds: Normal breath sounds. No wheezing.  Abdominal:     General: Bowel sounds are normal. There is no distension.     Palpations: Abdomen is soft.     Tenderness: There is no abdominal tenderness.  Genitourinary:    Comments: There is dark maroon-colored stool in the depends. Musculoskeletal:        General: Normal range of motion.     Cervical back: Normal range of motion and neck supple.  Skin:    General: Skin is warm and dry.  Neurological:     Mental Status: She is alert and  oriented to person, place, and time.     ED Results / Procedures / Treatments   Labs (all labs ordered are listed, but only abnormal results are displayed) Labs Reviewed  COMPREHENSIVE METABOLIC PANEL  CBC WITH DIFFERENTIAL/PLATELET  PROTIME-INR  TYPE AND SCREEN  TROPONIN I (HIGH SENSITIVITY)    EKG EKG Interpretation  Date/Time:  Saturday October 07 2020 00:14:34 EST Ventricular Rate:  102 PR Interval:    QRS Duration: 78 QT Interval:  372 QTC Calculation: 485 R Axis:   6 Text Interpretation: Sinus tachycardia Probable anterior infarct, old Baseline wander in lead(s) I aVL No significant change since 01/21/2018 Confirmed by Veryl Speak [phone]) on 10/07/2020 12:21:52 AM   Radiology No results found.  Procedures Procedures (including critical care time)  Medications Ordered in ED Medications  sodium chloride 0.9 % bolus 500 mL (has no administration in time range)    ED Course  I have reviewed the triage vital signs and the nursing notes.  Pertinent labs & imaging results that were available during my care of the patient were reviewed by me and considered in my medical decision making (see chart for details).    MDM Rules/Calculators/A&P  Patient is an 86 year old female brought from home for evaluation of fall.  She apparently became weak and fell to the floor apparently uninjured.  She was too weak to get up and  was found by EMS to be laying in dark maroon-colored stool.  Patient arrived here normotensive but mildly tachycardic.  Her physical examination did reveal dark, maroon-colored stool that was grossly bloody and Hemoccult positive.  Laboratory studies were obtained showing a hemoglobin of 6.5 which was a significant decrease from several days ago when her hemoglobin was 10.  Blood transfusions were ordered and care was discussed with Dr. Gala Romney from gastroenterology.  He has recommended resuscitation with fluids and blood and he will evaluate her in the morning.  Patient to be admitted by the hospitalist for further work-up.  CRITICAL CARE Performed by: Veryl Speak Total critical care time: 50 minutes Critical care time was exclusive of separately billable procedures and treating other patients. Critical care was necessary to treat or prevent imminent or life-threatening deterioration. Critical care was time spent personally by me on the following activities: development of treatment plan with patient and/or surrogate as well as nursing, discussions with consultants, evaluation of patient's response to treatment, examination of patient, obtaining history from patient or surrogate, ordering and performing treatments and interventions, ordering and review of laboratory studies, ordering and review of radiographic studies, pulse oximetry and re-evaluation of patient's condition.   Final Clinical Impression(s) / ED Diagnoses Final diagnoses:  None    Rx / DC Orders ED Discharge Orders    None       Veryl Speak, MD 10/08/20 0221

## 2020-10-07 NOTE — Progress Notes (Addendum)
86 year old female with history of hypertension, hyperlipidemia, GERD, breast cancer status post chemotherapy and currently on Enhertu infusion every 3 weeks presented to the hospital with syncopal episode, multiple black melanotic stools and found to have symptomatic anemia with hemoglobin of 6.5, baseline hemoglobin 10.3.  No previous history of GI bleed.  Also hypotensive on arrival.  Resuscitated.  Currently receiving third unit of PRBC with hemodynamic improvement.  Patient is pleasantly confused.  Husband does not think this is her baseline.  Remains on Protonix infusion.  Seen by GI and plan for upper GI endoscopy.  Continue to monitor hemoglobin every 6 hours for next 24 hours to ensure stabilization.  Husband at the bedside.  Addendum: Discussed with Dr Gala Romney.  Patient does have history of severe aortic stenosis, this has been present for long time.  Hemodynamically stabilized. Minimally elevated troponins in the setting of acute GI bleeding and mild renal function abnormalities are not significant.  This patient does not have ongoing myocardial infarction. Patient has an active upper GI bleeding with evidence of drop in hemoglobin more than 3 units in last 24 hours. Benefits of upper GI endoscopy under sedation outweigh any risk of anesthesia and sedation to save her life. Aortic stenosis can be followed up after the stabilization, she may benefit with minimally invasive procedure like TAVR.  I will make a referral to cardiology on discharge.  Addendum note 4 PM: Examined patient with persistent delirium and now depressed mentation.  A stat CT scan of the head is without evidence of bleeding or acute infarction.  Patient is difficult to awake.  She also received sedation and anesthesia for endoscopy.  No focal deficits. Ammonia pending. Started on appropriate treatment with Protonix infusion, octreotide infusion, Rocephin prophylaxis.  If ammonia is high, will give rectal lactulose. NG tube  is contraindicated with active varices. Discussed with patient's husband and son at the bedside.  Patient is full code, however they have living will for did not want to be going on life support with no chance to survive.

## 2020-10-07 NOTE — Progress Notes (Signed)
Discussed with Drs. Ghimire and [place].  Mildly elevated highly sensitive troponin I noted.  Echo in June indicated severe aortic stenosis.  No recent cardiology input. Patient presents with a life-threatening GI bleed.  Now resuscitated and having no cardiopulmonary symptoms. Dr. Sloan Leiter feels it is indicated to proceed with an upper endoscopy without further evaluation.  Under these circumstances, I agree.  Further recommendations to follow after completion of EGD.

## 2020-10-07 NOTE — Anesthesia Preprocedure Evaluation (Signed)
Anesthesia Evaluation  Patient identified by MRN, date of birth, ID band Patient confused  General Assessment Comment:Very drowsy, opening eyes   Reviewed: Allergy & Precautions, NPO status , Patient's Chart, lab work & pertinent test results, reviewed documented beta blocker date and time   Airway Mallampati: II  TM Distance: >3 FB Neck ROM: Full    Dental  (+) Dental Advisory Given, Caps   Pulmonary pneumonia, resolved,    Pulmonary exam normal breath sounds clear to auscultation       Cardiovascular Exercise Tolerance: Poor hypertension, Pt. on medications + Past MI (High troponins)  + Valvular Problems/Murmurs (severe aortic stenosis) AS  Rhythm:Regular Rate:Bradycardia  1. Left ventricular ejection fraction, by estimation, is 65 to 70%. The  left ventricle has normal function. The left ventricle has no regional  wall motion abnormalities. There is mild left ventricular hypertrophy.  Left ventricular diastolic parameters  are consistent with Grade I diastolic dysfunction (impaired relaxation).  2. Right ventricular systolic function is normal. The right ventricular  size is normal. There is normal pulmonary artery systolic pressure. The  estimated right ventricular systolic pressure is 41.6 mmHg.  3. Left atrial size was mildly dilated.  4. The mitral valve is grossly normal. Mild mitral valve regurgitation.  5. The aortic valve is tricuspid, moderately calcified with decreased  cusp excursion. Aortic valve regurgitation is trivial. Severe aortic valve  stenosis. Aortic valve area, by VTI measures 0.66 cm. Aortic valve mean  gradient measures 44.0 mmHg. Aortic  valve Vmax measures 4.13 m/s. Dimentionless index 0.26.  6. The inferior vena cava is normal in size with greater than 50%  respiratory variability, suggesting right atrial pressure of 3 mmHg.   Neuro/Psych  Neuromuscular disease    GI/Hepatic Neg liver  ROS, GERD  Medicated,Upper GI bleeding   Endo/Other    Renal/GU Renal disease (AKI)     Musculoskeletal  (+) Arthritis ,   Abdominal   Peds  Hematology  (+) anemia ,   Anesthesia Other Findings   Reproductive/Obstetrics                             Anesthesia Physical Anesthesia Plan  ASA: IV and emergent  Anesthesia Plan: General   Post-op Pain Management:    Induction: Intravenous  PONV Risk Score and Plan: TIVA  Airway Management Planned: Nasal Cannula, Natural Airway and Simple Face Mask  Additional Equipment:   Intra-op Plan:   Post-operative Plan: Possible Post-op intubation/ventilation  Informed Consent: I have reviewed the patients History and Physical, chart, labs and discussed the procedure including the risks, benefits and alternatives for the proposed anesthesia with the patient or authorized representative who has indicated his/her understanding and acceptance.     Dental advisory given and Consent reviewed with POA  Plan Discussed with: Surgeon  Anesthesia Plan Comments: (Discussed with Dr. Sydell Axon and patient's husband  regarding severe aortic stenosis, and high risk of MI and cardiac related complications.  )        Anesthesia Quick Evaluation

## 2020-10-07 NOTE — ED Notes (Signed)
EDP aware of vital sign trends

## 2020-10-07 NOTE — ED Notes (Signed)
MD Sydell Axon called, instructed to hold 4th unit of blood, pt will go to gi at aprox 130pm.

## 2020-10-07 NOTE — Anesthesia Postprocedure Evaluation (Signed)
Anesthesia Post Note  Patient: [name]  Procedure(s) Performed: ESOPHAGOGASTRODUODENOSCOPY (EGD) WITH PROPOFOL (N/A )  Patient location during evaluation: PACU Anesthesia Type: General Level of consciousness: awake, sedated and confused (same as preop) Pain management: pain level controlled Vital Signs Assessment: post-procedure vital signs reviewed and stable Respiratory status: spontaneous breathing and respiratory function stable Cardiovascular status: blood pressure returned to baseline and stable Postop Assessment: no apparent nausea or vomiting Anesthetic complications: no   No complications documented.   Last Vitals:  Vitals:   10/07/20 1436 10/07/20 1445  BP: 134/67 136/61  Pulse: (!) 112 99  Resp: 20 19  Temp:    SpO2: 100% 100%    Last Pain:  Vitals:   10/07/20 1444  TempSrc:   PainSc: 0-No pain                 Bannie Lobban C Taleeya Blondin

## 2020-10-07 NOTE — Interval H&P Note (Signed)
History and Physical Interval Note:  10/07/2020 1:39 PM  [name]  has presented today for surgery, with the diagnosis of UGI bleed.  The various methods of treatment have been discussed with the patient and family. After consideration of risks, benefits and other options for treatment, the patient has consented to  Procedure(s): ESOPHAGOGASTRODUODENOSCOPY (EGD) WITH PROPOFOL (N/A) as a surgical intervention.  The patient's history has been reviewed, patient examined, no change in status, stable for surgery.  I have reviewed the patient's chart and labs.  Questions were answered to the patient's satisfaction.     Manus Rudd  Patient seen again in short stay with Dr. Virl Son.  Patient's husband [name], in attendance. Discussed the risk and benefits of the procedure at length with husband [name].  Discussed aortic stenosis in particular with potential cardiovascular risk of the procedure. I explained that further evaluation may be needed depending on findings of today's procedure. Cardiology consultation recommended when feasible.

## 2020-10-07 NOTE — ED Notes (Signed)
Temp WDL; bair hugger removed

## 2020-10-07 NOTE — Consult Note (Addendum)
Referring Provider: No ref. provider found Primary Care Physician:  [name], MD Primary Gastroenterologist:  Dr. Gala Romney  Reason for Consultation:   GI bleed  HPI: Patient is a 86 year old lady with a history of breast cancer seeding ongoing chemotherapy who developed early acute onset weakness and near syncopal episode at home yesterday.  She passed multiple black bowel movements.  Brought to the hospital by EMS.  Soft blood pressures in the ED.  Hemoglobin 6.5 down from the 11.8  3 weeks ago.  Dr. Lanny Cramp consulted me early this morning stated also had a small amount of coffee ground emesis. She has received 3 units of packed RBCs when I came to see her today.  Her vital signs look good.  Nursing staff states she has had no further evidence of GI bleeding. She is not anticoagulated.  She takes no NSAIDs.  Her husband who accompanies her at the bedside provides excellent history. No prior gastrointestinal hemorrhage.  No prior gastrointestinal illness aside from occasional reflux symptoms.  No recent dysphagia nausea vomiting otherwise.  Has not had any chronic issues with GI bleeding. She is never had an EGD.  She had a colonoscopy 16 years ago when she resided in [place] without significant findings. Of note, her most recent cross-sectional chest imaging revealed a cirrhotic appearing liver with ascites.  In fact, she did have abdominal "swelling" for which she was given Lasix by Dr. [name].  That has improved significantly.  Spleen is normal in size.  No evidence of portal hypertension /esophageal varices on most recent scans; there is no history of yellow jaundice or chronic liver disease.  The patient has no history of alcohol use per husband's report. Some colonic wall thickening noted last imaging studies felt to be related to colopathy plus minus diverticulosis.  She is currently on a PPI infusion and follow-up labs are pending.  Past Medical History:  Diagnosis Date  .  Cancer ([place]) 06/2018   right breast cancer  . GERD (gastroesophageal reflux disease)    occasional   . Hyperlipidemia   . Hypertension    clearance with note Dr Nevada Crane on chart  . Pneumonia    2 years ago/ states occ cough with sinus drainage- no fever  . Thyroid nodule    with biopsy- states following medically    Past Surgical History:  Procedure Laterality Date  . CATARACT EXTRACTION W/PHACO  11/30/2012   Procedure: CATARACT EXTRACTION PHACO AND INTRAOCULAR LENS PLACEMENT (IOC);  Surgeon: Williams Che, MD;  Location: [facility];  Service: Ophthalmology;  Laterality: Left;  CDE:11.49  . CATARACT EXTRACTION W/PHACO Right 03/15/2013   Procedure: CATARACT EXTRACTION PHACO AND INTRAOCULAR LENS PLACEMENT (IOC);  Surgeon: Williams Che, MD;  Location: [facility];  Service: Ophthalmology;  Laterality: Right;  CDE 16.15  . COLONOSCOPY    . EXCISION OF SKIN TAG Right 06/17/2016   Procedure: SHAVE OF SKIN TAG RIGHT BUTTOCK;  Surgeon: Aviva Signs, MD;  Location: [facility];  Service: General;  Laterality: Right;  . JOINT REPLACEMENT     left knee  . MASS EXCISION Left 06/17/2016   Procedure: EXCISION SKIN MALIGNANT LESION LEFT BUTTOCK;  Surgeon: Aviva Signs, MD;  Location: [facility];  Service: General;  Laterality: Left;  . PORTACATH PLACEMENT Right 07/28/2018   Procedure: INSERTION PORT-A-CATH WITH ULTRASOUND;  Surgeon: Rolm Bookbinder, MD;  Location: [facility];  Service: General;  Laterality: Right;  . PUNCH BIOPSY OF SKIN Right 07/28/2018   Procedure:  PUNCH BIOPSY OF SKIN RIGHT BREAST;  Surgeon: Rolm Bookbinder, MD;  Location: [facility];  Service: General;  Laterality: Right;  . TOTAL KNEE ARTHROPLASTY  12/16/2011   Procedure: TOTAL KNEE ARTHROPLASTY;  Surgeon: Gearlean Alf, MD;  Location: [facility];  Service: Orthopedics;  Laterality: Right;  . TUBAL LIGATION      Prior to Admission medications   Medication Sig Start Date End Date Taking? Authorizing Provider   acetaminophen (TYLENOL) 325 MG tablet Take 650 mg by mouth every 6 (six) hours as needed.     [provider]  aspirin 325 MG tablet Take 162.5 mg by mouth daily.     [provider]  cetirizine (ZYRTEC) 10 MG tablet Take 10 mg by mouth daily.    [provider]  furosemide (LASIX) 20 MG tablet Take 1 tablet (20 mg total) by mouth daily. 08/08/20   Derek Jack, MD  hydrocortisone 1 % ointment Apply 1 application topically 2 (two) times daily. 09/18/18   Lockamy, Randi L, NP-C  lidocaine-prilocaine (EMLA) cream Apply to port site one hour prior to appointment and cover with plastic wrap. Patient not taking: Reported on 09/14/2020 07/29/18   Derek Jack, MD  Misc. Devices MISC Please provide lymphedema sleeve for right arm 11/03/18   Derek Jack, MD  Multiple Vitamin (MULTIVITAMIN WITH MINERALS) TABS tablet Take 1 tablet by mouth daily.    [provider]  ONETOUCH ULTRA test strip USE 1 STRIP TO CHECK GLUCOSE 2 TO 3 TIMES DAILY 04/21/19   [provider]    Current Facility-Administered Medications  Medication Dose Route Frequency Provider Last Rate Last Admin  . 0.9 %  sodium chloride infusion  10 mL/hr Intravenous Once Zierle-Ghosh, Asia B, DO      . 0.9 %  sodium chloride infusion  10 mL/hr Intravenous Once Zierle-Ghosh, Asia B, DO      . 0.9 %  sodium chloride infusion  10 mL/hr Intravenous Once Zierle-Ghosh, Asia B, DO      . acetaminophen (TYLENOL) tablet 650 mg  650 mg Oral Q6H PRN Zierle-Ghosh, Asia B, DO       Or  . acetaminophen (TYLENOL) suppository 650 mg  650 mg Rectal Q6H PRN Zierle-Ghosh, Asia B, DO      . morphine 2 MG/ML injection 2 mg  2 mg Intravenous Q2H PRN Zierle-Ghosh, Asia B, DO      . ondansetron (ZOFRAN) tablet 4 mg  4 mg Oral Q6H PRN Zierle-Ghosh, Asia B, DO       Or  . ondansetron (ZOFRAN) injection 4 mg  4 mg Intravenous Q6H PRN Zierle-Ghosh, Asia B, DO      . pantoprazole (PROTONIX) 80 mg in  sodium chloride 0.9 % 100 mL (0.8 mg/mL) infusion  8 mg/hr Intravenous Continuous Zierle-Ghosh, Asia B, DO 10 mL/hr at 10/07/20 0135 8 mg/hr at 10/07/20 0135  . [START ON 10/10/2020] pantoprazole (PROTONIX) injection 40 mg  40 mg Intravenous Q12H Zierle-Ghosh, Asia B, DO       Current Outpatient Medications  Medication Sig Dispense Refill  . acetaminophen (TYLENOL) 325 MG tablet Take 650 mg by mouth every 6 (six) hours as needed.     Marland Kitchen aspirin 325 MG tablet Take 162.5 mg by mouth daily.     . cetirizine (ZYRTEC) 10 MG tablet Take 10 mg by mouth daily.    . furosemide (LASIX) 20 MG tablet Take 1 tablet (20 mg total) by mouth daily. 30 tablet 3  . hydrocortisone  1 % ointment Apply 1 application topically 2 (two) times daily. 60 g 0  . lidocaine-prilocaine (EMLA) cream Apply to port site one hour prior to appointment and cover with plastic wrap. (Patient not taking: Reported on 09/14/2020) 30 g 3  . Misc. Devices MISC Please provide lymphedema sleeve for right arm 2 each 0  . Multiple Vitamin (MULTIVITAMIN WITH MINERALS) TABS tablet Take 1 tablet by mouth daily.    Glory Rosebush ULTRA test strip USE 1 STRIP TO CHECK GLUCOSE 2 TO 3 TIMES DAILY     Facility-Administered Medications Ordered in Other Encounters  Medication Dose Route Frequency Provider Last Rate Last Admin  . sodium chloride flush (NS) 0.9 % injection 10 mL  10 mL Intravenous PRN Derek Jack, MD   10 mL at 05/23/20 1025  . sodium chloride flush (NS) 0.9 % injection 10 mL  10 mL Intravenous PRN Derek Jack, MD   10 mL at 05/23/20 0900    Allergies as of 10/07/2020 - Review Complete 10/07/2020  Allergen Reaction Noted  . Penicillins Rash     Family History  Problem Relation Age of Onset  . Heart attack Mother   . Bronchitis Father   . Diabetes Sister   . Leukemia Brother   . Lung disease Brother   . Lung disease Sister     Social History   Socioeconomic History  . Marital status: Married    Spouse name:  Not on file  . Number of children: 2  . Years of education: some business school after HS  . Highest education level: Not on file  Occupational History  . Occupation: Cabin crew  . Occupation: Network engineer   Tobacco Use  . Smoking status: Never Smoker  . Smokeless tobacco: Never Used  Vaping Use  . Vaping Use: Never used  Substance and Sexual Activity  . Alcohol use: No  . Drug use: No  . Sexual activity: Yes    Birth control/protection: None  Other Topics Concern  . Not on file  Social History Narrative   Lives at home with her husband.   4 cups caffeine per day.   Right-handed.   Social Determinants of Health   Financial Resource Strain: Not on file  Food Insecurity: Not on file  Transportation Needs: Not on file  Physical Activity: Not on file  Stress: Not on file  Social Connections: Not on file  Intimate Partner Violence: Not on file    Review of Systems:  As in history of present illness.  Physical Exam: Vital signs in last 24 hours: Temp:  [94.2 F (34.6 C)-97.7 F (36.5 C)] 97.5 F (36.4 C) (12/11 0513) Pulse Rate:  [54-125] 104 (12/11 0730) Resp:  [15-28] 17 (12/11 0730) BP: (83-143)/(41-76) 136/74 (12/11 0700) SpO2:  [90 %-100 %] 100 % (12/11 0730) Weight:  [79.3 kg] 79.3 kg (12/11 0011)    Patient seen in ED room 14 General:   Patient appears inattentive conversation but does focus when you speak loudly indicative of a significant hearing deficit. Eyes:  Sclera clear, no icterus.   Conjunctiva pink. Lungs:  Clear throughout to auscultation.   No wheezes, crackles, or rhonchi. No acute distress. Heart:  Regular rate and rhythm; no murmurs, clicks, rubs,  or gallops. Abdomen: Obese.  Positive bowel sounds.  Soft and nontender Rectal: Already done by EDP  Intake/Output from previous day: 12/10 0701 - 12/11 0700 In: 315 [Blood:315] Out: -  Intake/Output this shift: No intake/output data recorded.  Lab Results: Recent Labs  10/05/20 1202  10/07/20 0017  WBC 9.1 13.2*  HGB 10.3* 6.5*  HCT 30.5* 19.8*  PLT 202 212   BMET Recent Labs    10/05/20 1202 10/07/20 0017  NA 135 136  K 3.4* 3.9  CL 105 104  CO2 20* 17*  GLUCOSE 104* 194*  BUN 21 66*  CREATININE 1.18* 1.47*  CALCIUM 8.7* 8.4*   LFT Recent Labs    10/07/20 0017  PROT 5.3*  ALBUMIN 2.4*  AST 42*  ALT 23  ALKPHOS 125  BILITOT 0.8   PT/INR Recent Labs    10/07/20 0017  LABPROT 16.0*  INR 1.3*    Impression: 86 year old lady with comorbidities including breast cancer currently undergoing chemotherapy being admitted with a hemodynamically significant GI bleed-probably upper in etiology. She appears to be adequately resuscitated at this time.  She has received IV fluids and 3 units of packed RBCs; follow-up H&H is pending On recent cross-sectional imaging she does appear to have a cirrhotic liver with associated ascites but no evidence of advanced portal hypertension (lack of esophageal varices/splenomegaly). Etiology of her bleed not yet known.  I suspect more proximal GI tract.  Although she could have esophageal varices, I think this may be less likely with lack of stigmata of portal hypertension on imaging.   Peptic ulcer disease, AVMs would certainly remain in the differential as would occult lesion in small bowel or proximal right colon. I doubt metastatic etiology at this point. Thickening seen on her colon on imaging studies earlier this year likely nonspecific possibly related to chronic liver disease.  However, further evaluation of this finding may be needed.  Recommendations: Fully agree with management strategy thus far.   Follow-up H&H is pending.  I have offered the patient a diagnostic EGD with therapeutic intent as appropriate in the near future.  The risks, benefits, limitations, alternatives and imponderables have been reviewed with the patient and her husband, Ron.. Potential for esophageal dilation, biopsy, etc. have also been  reviewed.  Questions have been answered. All parties agreeable.  Tentatively plan for EGD later today with the assistance of anesthesia utilizing MAC.  ASA 4.  Further recommendations to follow.  Addendum: Hemoglobin 10.3 after 3 units.  Will hold on the fourth unit.  We will proceed with an EGD per plan.  Will give her erythromycin 250 mg IV prior to the procedure to facilitate gastric emptying. Plan for procedure at 1300 today.  Rapid Covid testing ordered.  Discussed with Hydrographic surveyor   Notice:  This dictation was prepared with Dragon dictation along with smaller phrase technology. Any transcriptional errors that result from this process are unintentional and may not be corrected upon review.

## 2020-10-07 NOTE — Transfer of Care (Signed)
Immediate Anesthesia Transfer of Care Note  Patient: [name]  Procedure(s) Performed: ESOPHAGOGASTRODUODENOSCOPY (EGD) WITH PROPOFOL (N/A )  Patient Location: PACU  Anesthesia Type:General  Level of Consciousness: sedated and drowsy  Airway & Oxygen Therapy: Patient Spontanous Breathing and Patient connected to nasal cannula oxygen  Post-op Assessment: Report given to RN and Post -op Vital signs reviewed and stable  Post vital signs: Reviewed and stable  Last Vitals:  Vitals Value Taken Time  BP 130/59 10/07/20 1426  Temp 36.2 C 10/07/20 1418  Pulse 102 10/07/20 1427  Resp 16 10/07/20 1427  SpO2 100 % 10/07/20 1427  Vitals shown include unvalidated device data.  Last Pain:  Vitals:   10/07/20 1303  TempSrc:   PainSc: Asleep         Complications: No complications documented.

## 2020-10-07 NOTE — ED Notes (Signed)
Bair hugger applied.

## 2020-10-07 NOTE — H&P (Signed)
TRH H&P    Patient Demographics:    [name], is a 86 y.o. female  MRN: [id number]  DOB - Mar 21, 1930  Admit Date - 10/07/2020  Referring MD/NP/PA: Stark Jock  Outpatient Primary MD for the patient is [name], MD  Patient coming from: Home  Chief complaint- Generalized weakness and near syncopal episode   HPI:    [name]  is a 86 y.o. female, with history of thyroid nodule, pneumonia, hypertension, hyperlipidemia, GERD, breast cancer presents to the ER with a chief complaint of generalized weakness and near syncopal episode.  Most of history is obtained from husband at bedside.  It is difficult to distinguish between patient's inability to hear questions, versus her comprehension of the same questions.  In the room during the exam patient was putting her hand into her diaper scooping up the melena and putting it to her face, which indicates cognitive problems to me, but husband reports that she is normally alert and oriented x4, and attends to all her own ADLs.  Husband reports that patient had [place] around 12 PM.  They did not realize that that was indicative of GI bleed, and patient carried on normally with her day.  She had a total of 4 melanotic stools at home, and then started to have generalized weakness.  She called her husband that she was not able to move her legs and for him to come help.  He was not able to move her so they lowered her down to the floor.  He took some time to clean her up after that, and then they called EMS to come into the hospital.  He reports that she has been fatigued throughout the day, but she has had normal appetite she has not been nauseous or vomiting, and has not reported any kind of aches or pains.  He reports she was in her normal state of health up until today.  She is undergoing chemo with Dr. Raliegh Ip.  Unfortunately further history cannot be obtained as patient is not  answering questions.  Husband reports that patient is vaccinated for Covid, does not smoke, does not drink, does not use illicit drugs.  Patient is full code per husband's report.  In the ED Temp 94.2 after warming blanket 97.7, heart rate 99-117, respiratory rate 15-23, blood pressure 100/53, satting 97% on room air White blood cell count 13.2, hemoglobin 6.5, platelets 212 Troponin 36 Chemistry panel reveals a decreased bicarb at 17, and AKI with a creatinine of 1.47 baseline is 1.18. 500 mL bolus given in ED Warming blanket applied in ED Patient started on Protonix drip in ED Dr. Gala Romney consulted and reports that GI will see patient in a.m.    Review of systems:    In addition to the HPI above,  No Fever-chills, No Headache, No changes with Vision or hearing, No problems swallowing food or Liquids, No Chest pain, Cough or Shortness of Breath, No Abdominal pain, No Nausea or Vomiting, bowel movements are regular, Positive for melena and hematochezia  no dysuria, No new  skin rashes or bruises, No new joints pains-aches,  No new weakness, tingling, numbness in any extremity, No recent weight gain or loss, No polyuria, polydypsia or polyphagia, No significant Mental Stressors.  All other systems reviewed and are negative.    Past History of the following :    Past Medical History:  Diagnosis Date  . Cancer ([place]) 06/2018   right breast cancer  . GERD (gastroesophageal reflux disease)    occasional   . Hyperlipidemia   . Hypertension    clearance with note Dr Nevada Crane on chart  . Pneumonia    2 years ago/ states occ cough with sinus drainage- no fever  . Thyroid nodule    with biopsy- states following medically      Past Surgical History:  Procedure Laterality Date  . CATARACT EXTRACTION W/PHACO  11/30/2012   Procedure: CATARACT EXTRACTION PHACO AND INTRAOCULAR LENS PLACEMENT (IOC);  Surgeon: Williams Che, MD;  Location: [facility];  Service: Ophthalmology;  Laterality:  Left;  CDE:11.49  . CATARACT EXTRACTION W/PHACO Right 03/15/2013   Procedure: CATARACT EXTRACTION PHACO AND INTRAOCULAR LENS PLACEMENT (IOC);  Surgeon: Williams Che, MD;  Location: [facility];  Service: Ophthalmology;  Laterality: Right;  CDE 16.15  . COLONOSCOPY    . EXCISION OF SKIN TAG Right 06/17/2016   Procedure: SHAVE OF SKIN TAG RIGHT BUTTOCK;  Surgeon: Aviva Signs, MD;  Location: [facility];  Service: General;  Laterality: Right;  . JOINT REPLACEMENT     left knee  . MASS EXCISION Left 06/17/2016   Procedure: EXCISION SKIN MALIGNANT LESION LEFT BUTTOCK;  Surgeon: Aviva Signs, MD;  Location: [facility];  Service: General;  Laterality: Left;  . PORTACATH PLACEMENT Right 07/28/2018   Procedure: INSERTION PORT-A-CATH WITH ULTRASOUND;  Surgeon: Rolm Bookbinder, MD;  Location: [facility];  Service: General;  Laterality: Right;  . PUNCH BIOPSY OF SKIN Right 07/28/2018   Procedure: PUNCH BIOPSY OF SKIN RIGHT BREAST;  Surgeon: Rolm Bookbinder, MD;  Location: [facility];  Service: General;  Laterality: Right;  . TOTAL KNEE ARTHROPLASTY  12/16/2011   Procedure: TOTAL KNEE ARTHROPLASTY;  Surgeon: Gearlean Alf, MD;  Location: [facility];  Service: Orthopedics;  Laterality: Right;  . TUBAL LIGATION        Social History:      Social History   Tobacco Use  . Smoking status: Never Smoker  . Smokeless tobacco: Never Used  Substance Use Topics  . Alcohol use: No       Family History :     Family History  Problem Relation Age of Onset  . Heart attack Mother   . Bronchitis Father   . Diabetes Sister   . Leukemia Brother   . Lung disease Brother   . Lung disease Sister       Home Medications:   Prior to Admission medications   Medication Sig Start Date End Date Taking? Authorizing Provider  acetaminophen (TYLENOL) 325 MG tablet Take 650 mg by mouth every 6 (six) hours as needed.     [provider]  aspirin 325 MG tablet Take 162.5 mg by mouth  daily.     [provider]  cetirizine (ZYRTEC) 10 MG tablet Take 10 mg by mouth daily.    [provider]  furosemide (LASIX) 20 MG tablet Take 1 tablet (20 mg total) by mouth daily. 08/08/20   Derek Jack, MD  hydrocortisone 1 % ointment Apply 1 application topically 2 (two) times daily. 09/18/18  Lockamy, Randi L, NP-C  lidocaine-prilocaine (EMLA) cream Apply to port site one hour prior to appointment and cover with plastic wrap. Patient not taking: Reported on 09/14/2020 07/29/18   Derek Jack, MD  Misc. Devices MISC Please provide lymphedema sleeve for right arm 11/03/18   Derek Jack, MD  Multiple Vitamin (MULTIVITAMIN WITH MINERALS) TABS tablet Take 1 tablet by mouth daily.    [provider]  ONETOUCH ULTRA test strip USE 1 STRIP TO CHECK GLUCOSE 2 TO 3 TIMES DAILY 04/21/19   [provider]     Allergies:     Allergies  Allergen Reactions  . Penicillins Rash    Has patient had a PCN reaction causing immediate rash, facial/tongue/throat swelling, SOB or lightheadedness with hypotension: No Has patient had a PCN reaction causing severe rash involving mucus membranes or skin necrosis: No Has patient had a PCN reaction that required hospitalization: No Has patient had a PCN reaction occurring within the last 10 years: No If all of the above answers are "NO", then may proceed with Cephalosporin use.      Physical Exam:   Vitals  Blood pressure 116/60, pulse (!) 117, temperature (!) 97.4 F (36.3 C), resp. rate (!) 27, height 5\' 4"  (1.626 m), weight 79.3 kg, SpO2 100 %.  1.  General: Patient lying left lateral decubitus in bed, confused  2. Psychiatric: Patient is oriented to year but not to month or day of the week, oriented to self, not oriented to place, cooperative with exam  3. Neurologic: Face symmetric, speech and language normal, moves all 4 extremities voluntarily  4. HEENMT:  Head is atraumatic,  normocephalic, pupils reactive to light, neck is supple, mucous membranes are mildly dry, trachea is midline  5. Respiratory : Lungs are clear to auscultation bilaterally  6. Cardiovascular : Heart rate is tachycardic, rhythm is regular, systolic murmur, no rubs or gallops, no peripheral edema  7. Gastrointestinal:  Abdomen is soft, nondistended, nontender to palpation  8. Skin:  Skin is warm dry and intact without bruising on limited skin exam  9.Musculoskeletal:  No acute deformity, no calf tenderness    Data Review:    CBC Recent Labs  Lab 10/05/20 1202 10/07/20 0017  WBC 9.1 13.2*  HGB 10.3* 6.5*  HCT 30.5* 19.8*  PLT 202 212  MCV 102.7* 105.9*  MCH 34.7* 34.8*  MCHC 33.8 32.8  RDW 17.0* 17.2*  LYMPHSABS 2.3 1.1  MONOABS 1.2* 1.8*  EOSABS 0.4 0.0  BASOSABS 0.1 0.0   ------------------------------------------------------------------------------------------------------------------  Results for orders placed or performed during the hospital encounter of 10/07/20 (from the past 48 hour(s))  Comprehensive metabolic panel     Status: Abnormal   Collection Time: 10/07/20 12:17 AM  Result Value Ref Range   Sodium 136 135 - 145 mmol/L   Potassium 3.9 3.5 - 5.1 mmol/L   Chloride 104 98 - 111 mmol/L   CO2 17 (L) 22 - 32 mmol/L   Glucose, Bld 194 (H) 70 - 99 mg/dL    Comment: Glucose reference range applies only to samples taken after fasting for at least 8 hours.   BUN 66 (H) 8 - 23 mg/dL   Creatinine, Ser 1.47 (H) 0.44 - 1.00 mg/dL   Calcium 8.4 (L) 8.9 - 10.3 mg/dL   Total Protein 5.3 (L) 6.5 - 8.1 g/dL   Albumin 2.4 (L) 3.5 - 5.0 g/dL   AST 42 (H) 15 - 41 U/L   ALT 23 0 - 44 U/L  Alkaline Phosphatase 125 38 - 126 U/L   Total Bilirubin 0.8 0.3 - 1.2 mg/dL   GFR, Estimated 34 (L) >60 mL/min    Comment: (NOTE) Calculated using the CKD-EPI Creatinine Equation (2021)    Anion gap 15 5 - 15    Comment: Performed at [facility], [address] [place],  [place] [address]  CBC with Differential     Status: Abnormal   Collection Time: 10/07/20 12:17 AM  Result Value Ref Range   WBC 13.2 (H) 4.0 - 10.5 K/uL   RBC 1.87 (L) 3.87 - 5.11 MIL/uL   Hemoglobin 6.5 (LL) 12.0 - 15.0 g/dL    Comment: REPEATED TO VERIFY THIS CRITICAL RESULT HAS VERIFIED AND BEEN CALLED TO [facility] BY JAMIE WOODIE ON 12 11 2021 AT 0043, AND HAS BEEN READ BACK.     HCT 19.8 (L) 36.0 - 46.0 %   MCV 105.9 (H) 80.0 - 100.0 fL   MCH 34.8 (H) 26.0 - 34.0 pg   MCHC 32.8 30.0 - 36.0 g/dL   RDW 17.2 (H) 11.5 - 15.5 %   Platelets 212 150 - 400 K/uL   nRBC 0.0 0.0 - 0.2 %   Neutrophils Relative % 76 %   Neutro Abs 10.1 (H) 1.7 - 7.7 K/uL   Lymphocytes Relative 9 %   Lymphs Abs 1.1 0.7 - 4.0 K/uL   Monocytes Relative 14 %   Monocytes Absolute 1.8 (H) 0.1 - 1.0 K/uL   Eosinophils Relative 0 %   Eosinophils Absolute 0.0 0.0 - 0.5 K/uL   Basophils Relative 0 %   Basophils Absolute 0.0 0.0 - 0.1 K/uL   Immature Granulocytes 1 %   Abs Immature Granulocytes 0.10 (H) 0.00 - 0.07 K/uL    Comment: Performed at [facility], [address] [place], [place] [address]  Troponin I (High Sensitivity)     Status: Abnormal   Collection Time: 10/07/20 12:17 AM  Result Value Ref Range   Troponin I (High Sensitivity) 36 (H) <18 ng/L    Comment: (NOTE) Elevated high sensitivity troponin I (hsTnI) values and significant  changes across serial measurements may suggest ACS but many other  chronic and acute conditions are known to elevate hsTnI results.  Refer to the "Links" section for chest pain algorithms and additional  guidance. Performed at [facility], [address], [place], [place] [address]   Protime-INR     Status: Abnormal   Collection Time: 10/07/20 12:17 AM  Result Value Ref Range   Prothrombin Time 16.0 (H) 11.4 - 15.2 seconds   INR 1.3 (H) 0.8 - 1.2    Comment: (NOTE) INR goal varies based on device and disease states. Performed at [facility], [address],  [place], [place] [address]   POC occult blood, ED     Status: Abnormal   Collection Time: 10/07/20 12:23 AM  Result Value Ref Range   Fecal Occult Bld POSITIVE (A) NEGATIVE  Prepare RBC (crossmatch)     Status: None   Collection Time: 10/07/20 12:46 AM  Result Value Ref Range   Order Confirmation      ORDER PROCESSED BY BLOOD BANK Performed at [facility], [address] [place], [place] [address]   Type and screen     Status: None (Preliminary result)   Collection Time: 10/07/20 12:52 AM  Result Value Ref Range   ABO/RH(D) O POS    Antibody Screen NEG    Sample Expiration      10/10/2020,2359 Performed at  [facility], [address] [place], [place] [address]    Unit Number [id number]    Blood Component Type RBC, LR IRR    Unit division 00    Status of Unit ISSUED    Transfusion Status OK TO TRANSFUSE    Crossmatch Result Compatible    Unit Number [id number]    Blood Component Type RED CELLS,LR    Unit division 00    Status of Unit ISSUED    Transfusion Status OK TO TRANSFUSE    Crossmatch Result Compatible    Unit Number [id number]    Blood Component Type RED CELLS,LR    Unit division 00    Status of Unit ALLOCATED    Transfusion Status OK TO TRANSFUSE    Crossmatch Result Compatible   Resp Panel by RT-PCR (Flu A&B, Covid) Nasopharyngeal Swab     Status: None   Collection Time: 10/07/20  1:11 AM   Specimen: Nasopharyngeal Swab; Nasopharyngeal(NP) swabs in vial transport medium  Result Value Ref Range   SARS Coronavirus 2 by RT PCR NEGATIVE NEGATIVE    Comment: (NOTE) SARS-CoV-2 target nucleic acids are NOT DETECTED.  The SARS-CoV-2 RNA is generally detectable in upper respiratory specimens during the acute phase of infection. The lowest concentration of SARS-CoV-2 viral copies this assay can detect is 138 copies/mL. A negative result does not preclude SARS-Cov-2 infection and should not be used as the sole basis for treatment or other patient management  decisions. A negative result may occur with  improper specimen collection/handling, submission of specimen other than nasopharyngeal swab, presence of viral mutation(s) within the areas targeted by this assay, and inadequate number of viral copies(<138 copies/mL). A negative result must be combined with clinical observations, patient history, and epidemiological information. The expected result is Negative.  Fact Sheet for Patients:  EntrepreneurPulse.com.au  Fact Sheet for Healthcare Providers:  IncredibleEmployment.be  This test is no t yet approved or cleared by the [place] FDA and  has been authorized for detection and/or diagnosis of SARS-CoV-2 by FDA under an Emergency Use Authorization (EUA). This EUA will remain  in effect (meaning this test can be used) for the duration of the COVID-19 declaration under Section 564(b)(1) of the Act, 21 U.S.C.section 360bbb-3(b)(1), unless the authorization is terminated  or revoked sooner.       Influenza A by PCR NEGATIVE NEGATIVE   Influenza B by PCR NEGATIVE NEGATIVE    Comment: (NOTE) The Xpert Xpress SARS-CoV-2/FLU/RSV plus assay is intended as an aid in the diagnosis of influenza from Nasopharyngeal swab specimens and should not be used as a sole basis for treatment. Nasal washings and aspirates are unacceptable for Xpert Xpress SARS-CoV-2/FLU/RSV testing.  Fact Sheet for Patients: EntrepreneurPulse.com.au  Fact Sheet for Healthcare Providers: IncredibleEmployment.be  This test is not yet approved or cleared by the [place] FDA and has been authorized for detection and/or diagnosis of SARS-CoV-2 by FDA under an Emergency Use Authorization (EUA). This EUA will remain in effect (meaning this test can be used) for the duration of the COVID-19 declaration under Section 564(b)(1) of the Act, 21 U.S.C. section 360bbb-3(b)(1), unless the authorization  is terminated or revoked.  Performed at [facility], [address], [place], [place] [address]   Prepare RBC (crossmatch)     Status: None   Collection Time: 10/07/20  2:57 AM  Result Value Ref Range   Order Confirmation      ORDER PROCESSED BY BLOOD BANK Performed at [facility], [address]  [place], [place] [address]   Troponin I (High Sensitivity)     Status: Abnormal   Collection Time: 10/07/20  2:57 AM  Result Value Ref Range   Troponin I (High Sensitivity) 52 (H) <18 ng/L    Comment: (NOTE) Elevated high sensitivity troponin I (hsTnI) values and significant  changes across serial measurements may suggest ACS but many other  chronic and acute conditions are known to elevate hsTnI results.  Refer to the "Links" section for chest pain algorithms and additional  guidance. Performed at [facility], [address] [place], [place] [address]     Chemistries  Recent Labs  Lab 10/05/20 1202 10/07/20 0017  NA 135 136  K 3.4* 3.9  CL 105 104  CO2 20* 17*  GLUCOSE 104* 194*  BUN 21 66*  CREATININE 1.18* 1.47*  CALCIUM 8.7* 8.4*  MG 1.7  --   AST 46* 42*  ALT 24 23  ALKPHOS 155* 125  BILITOT 1.4* 0.8   ------------------------------------------------------------------------------------------------------------------  ------------------------------------------------------------------------------------------------------------------ GFR: Estimated Creatinine Clearance: 26.9 mL/min (A) (by C-G formula based on SCr of 1.47 mg/dL (H)). Liver Function Tests: Recent Labs  Lab 10/05/20 1202 10/07/20 0017  AST 46* 42*  ALT 24 23  ALKPHOS 155* 125  BILITOT 1.4* 0.8  PROT 6.1* 5.3*  ALBUMIN 2.8* 2.4*   No results for input(s): LIPASE, AMYLASE in the last 168 hours. No results for input(s): AMMONIA in the last 168 hours. Coagulation Profile: Recent Labs  Lab 10/07/20 0017  INR 1.3*   Cardiac Enzymes: No results for input(s): CKTOTAL, CKMB, CKMBINDEX, TROPONINI  in the last 168 hours. BNP (last 3 results) No results for input(s): PROBNP in the last 8760 hours. HbA1C: No results for input(s): HGBA1C in the last 72 hours. CBG: No results for input(s): GLUCAP in the last 168 hours. Lipid Profile: No results for input(s): CHOL, HDL, LDLCALC, TRIG, CHOLHDL, LDLDIRECT in the last 72 hours. Thyroid Function Tests: No results for input(s): TSH, T4TOTAL, FREET4, T3FREE, THYROIDAB in the last 72 hours. Anemia Panel: No results for input(s): VITAMINB12, FOLATE, FERRITIN, TIBC, IRON, RETICCTPCT in the last 72 hours.  --------------------------------------------------------------------------------------------------------------- Urine analysis:    Component Value Date/Time   COLORURINE YELLOW 01/21/2018 1615   APPEARANCEUR CLOUDY (A) 01/21/2018 1615   LABSPEC 1.014 01/21/2018 1615   PHURINE 6.0 01/21/2018 1615   GLUCOSEU NEGATIVE 01/21/2018 1615   HGBUR NEGATIVE 01/21/2018 1615   BILIRUBINUR NEGATIVE 01/21/2018 1615   KETONESUR NEGATIVE 01/21/2018 1615   PROTEINUR 100 (A) 01/21/2018 1615   UROBILINOGEN 0.2 12/05/2011 1037   NITRITE POSITIVE (A) 01/21/2018 1615   LEUKOCYTESUR LARGE (A) 01/21/2018 1615      Imaging Results:    No results found.     Assessment & Plan:    Principal Problem:   GI bleed Active Problems:   Symptomatic anemia   AKI (acute kidney injury) ([place])   Near syncope   Moderate protein-calorie malnutrition ([facility])   Leukocytosis   1. Symptomatic anemia 1. Hemoglobin 6.5 2. Likely upper GI bleed 3. Transfuse 4 units given active bleed 4. Patient generalized weakness and near syncopal episode at home 2. GI bleed 1. Black and maroon bowel movements x4 2. Coffee-ground emesis reported by ED provider 3. Hemoglobin 6.5 4. GI consulted recommended starting Protonix and transfusion 5. GI to see in a.m. 6. Monitor H&H every 6 hours 7. Continue to monitor 3. Near syncopal episode 1. Secondary to anemia/orthostatic most  likely 2. Monitor on telemetry 4. AKI 1. Secondary to anemia 2.  Trend after transfusion 5. Moderate protein calorie malnutrition 1. Patient is able to tolerate p.o. intake will encourage nutrient dense food choices 6. Leukocytosis 1. Likely acute phase reaction continue to monitor no infectious symptoms reported   DVT Prophylaxis-   SCDs AM Labs Ordered, also please review Full Orders  Family Communication: Admission, patients condition and plan of care including tests being ordered have been discussed with the patient and husband and daughter who indicate understanding and agree with the plan and Code Status.  Code Status: Full  Admission status: Inpatient :The appropriate admission status for this patient is INPATIENT. Inpatient status is judged to be reasonable and necessary in order to provide the required intensity of service to ensure the patient's safety. The patient's presenting symptoms, physical exam findings, and initial radiographic and laboratory data in the context of their chronic comorbidities is felt to place them at high risk for further clinical deterioration. Furthermore, it is not anticipated that the patient will be medically stable for discharge from the hospital within 2 midnights of admission. The following factors support the admission status of inpatient.     The patient's presenting symptoms include weakness presyncopal episode The worrisome physical exam findings include melena and maroon bowel movement The initial radiographic and laboratory data are worrisome because of globin 6.5 The chronic co-morbidities include breast cancer, hypertension, hyperlipidemia, GERD       * I certify that at the point of admission it is my clinical judgment that the patient will require inpatient hospital care spanning beyond 2 midnights from the point of admission due to high intensity of service, high risk for further deterioration and high frequency of surveillance  required.*  Time spent in minutes : [place]

## 2020-10-08 DIAGNOSIS — K7682 Hepatic encephalopathy: Secondary | ICD-10-CM

## 2020-10-08 DIAGNOSIS — I350 Nonrheumatic aortic (valve) stenosis: Secondary | ICD-10-CM

## 2020-10-08 DIAGNOSIS — D649 Anemia, unspecified: Secondary | ICD-10-CM

## 2020-10-08 LAB — CBC
HCT: 27.5 % — ABNORMAL LOW (ref 36.0–46.0)
Hemoglobin: 9.5 g/dL — ABNORMAL LOW (ref 12.0–15.0)
MCH: 32.3 pg (ref 26.0–34.0)
MCHC: 34.5 g/dL (ref 30.0–36.0)
MCV: 93.5 fL (ref 80.0–100.0)
Platelets: 180 10*3/uL (ref 150–400)
RBC: 2.94 MIL/uL — ABNORMAL LOW (ref 3.87–5.11)
RDW: 19.2 % — ABNORMAL HIGH (ref 11.5–15.5)
WBC: 14.2 10*3/uL — ABNORMAL HIGH (ref 4.0–10.5)
nRBC: 0 % (ref 0.0–0.2)

## 2020-10-08 LAB — GLUCOSE, CAPILLARY
Glucose-Capillary: 131 mg/dL — ABNORMAL HIGH (ref 70–99)
Glucose-Capillary: 136 mg/dL — ABNORMAL HIGH (ref 70–99)
Glucose-Capillary: 146 mg/dL — ABNORMAL HIGH (ref 70–99)
Glucose-Capillary: 152 mg/dL — ABNORMAL HIGH (ref 70–99)

## 2020-10-08 LAB — CBC WITH DIFFERENTIAL/PLATELET
Abs Immature Granulocytes: 0.25 10*3/uL — ABNORMAL HIGH (ref 0.00–0.07)
Basophils Absolute: 0 10*3/uL (ref 0.0–0.1)
Basophils Relative: 0 %
Eosinophils Absolute: 0 10*3/uL (ref 0.0–0.5)
Eosinophils Relative: 0 %
HCT: 28 % — ABNORMAL LOW (ref 36.0–46.0)
Hemoglobin: 9.5 g/dL — ABNORMAL LOW (ref 12.0–15.0)
Immature Granulocytes: 2 %
Lymphocytes Relative: 17 %
Lymphs Abs: 2.3 10*3/uL (ref 0.7–4.0)
MCH: 32 pg (ref 26.0–34.0)
MCHC: 33.9 g/dL (ref 30.0–36.0)
MCV: 94.3 fL (ref 80.0–100.0)
Monocytes Absolute: 2 10*3/uL — ABNORMAL HIGH (ref 0.1–1.0)
Monocytes Relative: 15 %
Neutro Abs: 8.9 10*3/uL — ABNORMAL HIGH (ref 1.7–7.7)
Neutrophils Relative %: 66 %
Platelets: 188 10*3/uL (ref 150–400)
RBC: 2.97 MIL/uL — ABNORMAL LOW (ref 3.87–5.11)
RDW: 19 % — ABNORMAL HIGH (ref 11.5–15.5)
WBC: 13.5 10*3/uL — ABNORMAL HIGH (ref 4.0–10.5)
nRBC: 0.1 % (ref 0.0–0.2)

## 2020-10-08 LAB — BASIC METABOLIC PANEL
Anion gap: 11 (ref 5–15)
BUN: 93 mg/dL — ABNORMAL HIGH (ref 8–23)
CO2: 18 mmol/L — ABNORMAL LOW (ref 22–32)
Calcium: 8.3 mg/dL — ABNORMAL LOW (ref 8.9–10.3)
Chloride: 110 mmol/L (ref 98–111)
Creatinine, Ser: 1.83 mg/dL — ABNORMAL HIGH (ref 0.44–1.00)
GFR, Estimated: 26 mL/min — ABNORMAL LOW (ref >60)
Glucose, Bld: 192 mg/dL — ABNORMAL HIGH (ref 70–99)
Potassium: 4 mmol/L (ref 3.5–5.1)
Sodium: 139 mmol/L (ref 135–145)

## 2020-10-08 MED ORDER — OCTREOTIDE ACETATE 500 MCG/ML IJ SOLN
INTRAMUSCULAR | Status: AC
  Filled 2020-10-08: qty 1

## 2020-10-08 MED ORDER — LACTULOSE ENEMA
300.0000 mL | Freq: Four times a day (QID) | ORAL | Status: DC
  Administered 2020-10-08 – 2020-10-09 (×5): 300 mL via RECTAL
  Filled 2020-10-08 (×12): qty 300

## 2020-10-08 MED ORDER — CHLORHEXIDINE GLUCONATE CLOTH 2 % EX PADS
6.0000 | MEDICATED_PAD | Freq: Every day | CUTANEOUS | Status: DC
  Administered 2020-10-08 – 2020-10-13 (×6): 6 via TOPICAL

## 2020-10-08 NOTE — Progress Notes (Signed)
PROGRESS NOTE    [name]  QVZ:[id number] DOB: 1930-06-08 DOA: 10/07/2020 PCP: [name], MD    Brief Narrative:  86 year old female with history of hypertension, hyperlipidemia, GERD, breast cancer who was treated with chemotherapy and currently on Enhertu infusion every 3 weeks presented to the hospital with syncopal episode, multiple black melanotic stool and confusion and delirium where she was found to have acute GI bleeding with hemoglobin 6.5.  Baseline hemoglobin 10.3.  No previous history of GI bleed.  Hypotensive on arrival.  She was aggressively resuscitated and received blood transfusions.  She underwent upper GI endoscopy and found to have variceal bleeding. Patient was confused on arrival, continues to be encephalopathic and now more obtunded.   Assessment & Plan:   Principal Problem:   GI bleed Active Problems:   Symptomatic anemia   AKI (acute kidney injury) ([place])   Near syncope   Moderate protein-calorie malnutrition (HCC)   Leukocytosis  Acute upper GI bleeding/variceal bleeding/acute blood loss anemia: IV fluid resuscitation, 3 units of PRBC transfusion with stabilization of hemoglobin.  Today it is more than 9.  Continue to monitor every 12 hours to ensure stabilization. Status post EGD, grade 2 and 3 esophageal varices, no active bleeding but recent bleeding into the stomach. Remains on Protonix infusion, octreotide infusion. Followed by gastroenterology.  Acute metabolic encephalopathy/suspected acute hepatic metabolic encephalopathy with variceal bleeding: Morphology evidence of liver cirrhosis, synthetic functions are normal.  Mild elevated INR 1.3. Patient developed profound and persistent encephalopathy, worse after sedation. Ammonia level 73. CT scan of the head with no evidence of hemorrhage or acute stroke. Electrolytes are fairly normal. NG tube is contraindicated, not taking any thing by mouth. On Rocephin for prophylaxis Start lactulose  enema and induce bowel movements at least 3-4 times a day. Every day liver function test, coagulation test and ammonia. We will recheck ammonia levels, however serum ammonia level does not correspond with level of consciousness.  Acute kidney injury: Due to #1.  Remains on maintenance IV fluids with dextrose.  Check blood sugars every 4 hours and avoid hypoglycemia.  Urine output is adequate.  Leukocytosis: No evidence of active bacterial infection.  Improving.  Remains on Rocephin for hepatic encephalopathy. CT scan of the abdomen pelvis done on 10/7 with ascites, currently no tense ascites.  Will request abdominal ultrasound and ultrasound-guided paracentesis with interventional radiology if we can get any samples. Her CT scan also consistent with chronic cirrhotic morphology of the liver.  Severe aortic stenosis: Recent echocardiogram with severe aortic stenosis.  If patient does good clinical recovery, will send a referral to cardiology for follow-up.  Breast cancer: Patient on Enhertu infusion.  Last dose taken 12/9.  Hold further cancer treatment now.  Family communication: Met with patient's husband and son at the bedside 12/11 and explained. Met with patient's husband at the bedside 12/12.  Discussed about persistent encephalopathy, underlying multiple medical issues and conditions.  We discussed about probable poor outcome. Discussed CODE STATUS, she has a living will that says that she wants to have all treatment options but do not want to be on machine support if she is in vegetative state / no chance of recovery. She is full code for treatment purpose.   DVT prophylaxis: SCDs Start: 10/07/20 3295   Code Status: Full code Family Communication: Husband at bedside Disposition Plan: Status is: Inpatient  Remains inpatient appropriate because:Hemodynamically unstable, Persistent severe electrolyte disturbances and Inpatient level of care appropriate due to severity of  illness   Dispo: The patient is from: Home              Anticipated d/c is to: Unknown at this time              Anticipated d/c date is: > 3 days              Patient currently is not medically stable to d/c.         Consultants:   Gastroenterology  Procedures:   Upper GI endoscopy 12/11  Antimicrobials:   Rocephin, 12/11---   Subjective: Patient seen and examined.  Overnight she did not wake up.  Patient is obtunded, difficult to arouse.  Remains afebrile.  Objective: Vitals:   10/07/20 1445 10/07/20 2035 10/08/20 0017 10/08/20 0429  BP: 136/61 132/62 (!) 131/58 (!) 118/43  Pulse: 99 (!) 110 90 91  Resp: _0 Temp:  98.6 F (37 C) 98 F (36.7 C) 98.3 F (36.8 C)  TempSrc:  Axillary Oral   SpO2: 100%  100% 99%  Weight:    84.9 kg  Height:        Intake/Output Summary (Last 24 hours) at 10/08/2020 1054 Last data filed at 10/08/2020 0429 Gross per 24 hour  Intake 1355.13 ml  Output 900 ml  Net 455.13 ml   Filed Weights   10/07/20 0011 10/08/20 0429  Weight: 79.3 kg 84.9 kg    Examination:  General exam: Sick looking, not responding.  Moans on painful stimuli. Respiratory system: Clear to auscultation.  Conducted airway sounds. Cardiovascular system: S1 & S2 heard, tachycardic. Gastrointestinal system: Soft.  Nondistended.  No rigidity. Central nervous system: Obtunded, moans on painful stimuli.  Not following any commands. Skin: No rashes, lesions or ulcers  Data Reviewed: I have personally reviewed following labs and imaging studies  CBC: Recent Labs  Lab 10/05/20 1202 10/07/20 0017 10/07/20 0940 10/07/20 1800 10/08/20 0527  WBC 9.1 13.2* 16.2* 15.3* 14.2*  NEUTROABS 5.0 10.1*  --   --   --   HGB 10.3* 6.5* 10.3* 9.8* 9.5*  HCT 30.5* 19.8* 30.1* 28.8* 27.5*  MCV 102.7* 105.9* 94.4 92.6 93.5  PLT 202 212 159 169 315   Basic Metabolic Panel: Recent Labs  Lab 10/05/20 1202 10/07/20 0017 10/07/20 0940 10/08/20 0527  NA 135  136 138 139  K 3.4* 3.9 4.2 4.0  CL 105 104 109 110  CO2 20* 17* 20* 18*  GLUCOSE 104* 194* 170* 192*  BUN 21 66* 81* 93*  CREATININE 1.18* 1.47* 1.40* 1.83*  CALCIUM 8.7* 8.4* 8.2* 8.3*  MG 1.7  --  2.3  --    GFR: Estimated Creatinine Clearance: 22.4 mL/min (A) (by C-G formula based on SCr of 1.83 mg/dL (H)). Liver Function Tests: Recent Labs  Lab 10/05/20 1202 10/07/20 0017 10/07/20 0940  AST 46* 42* 39  ALT _1 ALKPHOS 155* 125 104  BILITOT 1.4* 0.8 1.4*  PROT 6.1* 5.3* 4.7*  ALBUMIN 2.8* 2.4* 2.3*   No results for input(s): LIPASE, AMYLASE in the last 168 hours. Recent Labs  Lab 10/07/20 1800  AMMONIA 73*   Coagulation Profile: Recent Labs  Lab 10/07/20 0017  INR 1.3*   Cardiac Enzymes: No results for input(s): CKTOTAL, CKMB, CKMBINDEX, TROPONINI in the last 168 hours. BNP (last 3 results) No results for input(s): PROBNP in the last 8760 hours. HbA1C: No results for input(s): HGBA1C in the last 72 hours. CBG: Recent Labs  Lab 10/07/20  [place]   Lipid Profile: No results for input(s): CHOL, HDL, LDLCALC, TRIG, CHOLHDL, LDLDIRECT in the last 72 hours. Thyroid Function Tests: No results for input(s): TSH, T4TOTAL, FREET4, T3FREE, THYROIDAB in the last 72 hours. Anemia Panel: No results for input(s): VITAMINB12, FOLATE, FERRITIN, TIBC, IRON, RETICCTPCT in the last 72 hours. Sepsis Labs: No results for input(s): PROCALCITON, LATICACIDVEN in the last 168 hours.  Recent Results (from the past 240 hour(s))  Resp Panel by RT-PCR (Flu A&B, Covid) Nasopharyngeal Swab     Status: None   Collection Time: 10/07/20  1:11 AM   Specimen: Nasopharyngeal Swab; Nasopharyngeal(NP) swabs in vial transport medium  Result Value Ref Range Status   SARS Coronavirus 2 by RT PCR NEGATIVE NEGATIVE Final    Comment: (NOTE) SARS-CoV-2 target nucleic acids are NOT DETECTED.  The SARS-CoV-2 RNA is generally detectable in upper respiratory specimens during the  acute phase of infection. The lowest concentration of SARS-CoV-2 viral copies this assay can detect is 138 copies/mL. A negative result does not preclude SARS-Cov-2 infection and should not be used as the sole basis for treatment or other patient management decisions. A negative result may occur with  improper specimen collection/handling, submission of specimen other than nasopharyngeal swab, presence of viral mutation(s) within the areas targeted by this assay, and inadequate number of viral copies(<138 copies/mL). A negative result must be combined with clinical observations, patient history, and epidemiological information. The expected result is Negative.  Fact Sheet for Patients:  EntrepreneurPulse.com.au  Fact Sheet for Healthcare Providers:  IncredibleEmployment.be  This test is no t yet approved or cleared by the [place] FDA and  has been authorized for detection and/or diagnosis of SARS-CoV-2 by FDA under an Emergency Use Authorization (EUA). This EUA will remain  in effect (meaning this test can be used) for the duration of the COVID-19 declaration under Section 564(b)(1) of the Act, 21 U.S.C.section 360bbb-3(b)(1), unless the authorization is terminated  or revoked sooner.       Influenza A by PCR NEGATIVE NEGATIVE Final   Influenza B by PCR NEGATIVE NEGATIVE Final    Comment: (NOTE) The Xpert Xpress SARS-CoV-2/FLU/RSV plus assay is intended as an aid in the diagnosis of influenza from Nasopharyngeal swab specimens and should not be used as a sole basis for treatment. Nasal washings and aspirates are unacceptable for Xpert Xpress SARS-CoV-2/FLU/RSV testing.  Fact Sheet for Patients: EntrepreneurPulse.com.au  Fact Sheet for Healthcare Providers: IncredibleEmployment.be  This test is not yet approved or cleared by the [place] FDA and has been authorized for detection and/or  diagnosis of SARS-CoV-2 by FDA under an Emergency Use Authorization (EUA). This EUA will remain in effect (meaning this test can be used) for the duration of the COVID-19 declaration under Section 564(b)(1) of the Act, 21 U.S.C. section 360bbb-3(b)(1), unless the authorization is terminated or revoked.  Performed at [facility], [address], [place], [place] [address]          Radiology Studies: CT HEAD WO CONTRAST  Result Date: 10/07/2020 CLINICAL DATA:  Altered mental status. EXAM: CT HEAD WITHOUT CONTRAST TECHNIQUE: Contiguous axial images were obtained from the base of the skull through the vertex without intravenous contrast. COMPARISON:  None. FINDINGS: Brain: No evidence of acute infarction, hemorrhage, hydrocephalus, extra-axial collection or mass lesion/mass effect. Vascular: No hyperdense vessel or unexpected calcification. Skull: Normal. Negative for fracture or focal lesion. Sinuses/Orbits: No acute finding. Other: None. IMPRESSION: Normal head CT. Electronically Signed   By: Jeneen Rinks  Murlean Caller M.D.   On: 10/07/2020 15:34        Scheduled Meds: . Chlorhexidine Gluconate Cloth  6 each Topical Daily  . lactulose  300 mL Rectal Once  . [START ON 10/10/2020] pantoprazole  40 mg Intravenous Q12H   Continuous Infusions: . sodium chloride    . sodium chloride    . sodium chloride 10 mL/hr (10/07/20 1813)  . cefTRIAXone (ROCEPHIN)  IV 2 g (10/07/20 1632)  . dextrose 100 mL/hr at 10/08/20 0056  . octreotide  (SANDOSTATIN)    IV infusion 50 mcg/hr (10/08/20 0630)  . pantoprozole (PROTONIX) infusion 8 mg/hr (10/08/20 1052)     LOS: 1 day    Time spent: 40 minutes    Barb Merino, MD [facility] Pager [phone]

## 2020-10-08 NOTE — Progress Notes (Signed)
Much more obtunded today.  Withdraws to physical stimuli.  Currently receiving a lactulose enema when I came to see her. She has some old blood in the rectal vault  - but no active bleeding observed overnight. Hemoglobin holding at 9.5 this morning.  Ammonia increased to 73. BUN markedly elevated at 93  Ceftriaxone, IV PPI infusion and octreotide  -  ongoing therapy  Vital signs in last 24 hours: Temp:  [97.2 F (36.2 C)-98.6 F (37 C)] 98.3 F (36.8 C) (12/12 0429) Pulse Rate:  [90-112] 91 (12/12 0429) Resp:  [17-20] 17 (12/12 0429) BP: (116-148)/(43-67) 118/43 (12/12 0429) SpO2:  [99 %-100 %] 99 % (12/12 0429) Weight:  [84.9 kg] 84.9 kg (12/12 0429) Last BM Date: 10/07/20 General: Deeply obtunded. Enema in progress Intake/Output from previous day: 12/11 0701 - 12/12 0700 In: 1670.1 [I.V.:1152.2; Blood:315; IV Piggyback:203] Out: 900 [Urine:900] Intake/Output this shift: No intake/output data recorded.  Lab Results: Recent Labs    10/07/20 0940 10/07/20 1800 10/08/20 0527  WBC 16.2* 15.3* 14.2*  HGB 10.3* 9.8* 9.5*  HCT 30.1* 28.8* 27.5*  PLT 159 169 180   BMET Recent Labs    10/07/20 0017 10/07/20 0940 10/08/20 0527  NA 136 138 139  K 3.9 4.2 4.0  CL 104 109 110  CO2 17* 20* 18*  GLUCOSE 194* 170* 192*  BUN 66* 81* 93*  CREATININE 1.47* 1.40* 1.83*  CALCIUM 8.4* 8.2* 8.3*   LFT Recent Labs    10/07/20 0940  PROT 4.7*  ALBUMIN 2.3*  AST 39  ALT 21  ALKPHOS 104  BILITOT 1.4*   PT/INR Recent Labs    10/07/20 0017  LABPROT 16.0*  INR 1.3*   Hepatitis Panel No results for input(s): HEPBSAG, HCVAB, HEPAIGM, HEPBIGM in the last 72 hours. C-Diff No results for input(s): CDIFFTOX in the last 72 hours.  Studies/Results: CT HEAD WO CONTRAST  Result Date: 10/07/2020 CLINICAL DATA:  Altered mental status. EXAM: CT HEAD WITHOUT CONTRAST TECHNIQUE: Contiguous axial images were obtained from the base of the skull through the vertex without intravenous  contrast. COMPARISON:  None. FINDINGS: Brain: No evidence of acute infarction, hemorrhage, hydrocephalus, extra-axial collection or mass lesion/mass effect. Vascular: No hyperdense vessel or unexpected calcification. Skull: Normal. Negative for fracture or focal lesion. Sinuses/Orbits: No acute finding. Other: None. IMPRESSION: Normal head CT. Electronically Signed   By: Marijo Conception M.D.   On: 10/07/2020 15:34    Assessment: Principal Problem:   GI bleed Active Problems:   Symptomatic anemia   AKI (acute kidney injury) ([place])   Near syncope   Moderate protein-calorie malnutrition (HCC)   Leukocytosis   Acute hepatic encephalopathy   Aortic valvar stenosis   Impression: 86 year old lady with multiple comorbidities including breast cancer,  critical aortic stenosis and recent diagnosis of cirrhosis admitted with upper GI bleed. Incomplete EGD yesterday demonstrated old blood in proximal stomach which precluded completing evaluation.  Esophageal varices present without convincing stigmata of recent bleeding Esophagitis also present.  Coexisting Mallory-Weiss tear equivalent not excluded.  Bleeding seems to have ceased.  Reassuring negative head CT.  I suspect her decline in mental status goes along with a large protein load to the upper GI tract (blood) precipitating acute hepatic encephalopathy.  She is near coma.  Airway control may be a major issue soon if mental status does not improve.    Recommendations: Continue octreotide, PPI and ceftriaxone for now.  Of course, minimize CNS locations.  Need to aggressively treat hepatic encephalopathy  with lactulose enemas (I have ordered 4  more over the next 24 hours).  If she wakes up, would change lactulose to the oral route and add Xifaxan.  I have discussed with Dr. Sloan Leiter earlier today.  I have discussed patient's critical illness in person with husband, Dottie Vaquerano.  Prognosis is guarded.  I explained to him she may not survive this  hospitalization given decompensation and other multiple comorbidities.  We will reassess her progress over the next 24 hours.

## 2020-10-08 NOTE — Progress Notes (Signed)
Called By nurse as Family Husband had some concerns about patient sleeping with Mouth open. Dry mouth leading to problems with breathing. Patients saturation 97 on 2 liters. She is waking but not talking or drinking fluid as of yet. She had endoscope today for Gi Bleed. Also had Chemo for breast CA which precipitated GI Bleed on Friday. Placed H2o bottle on oxygen for humidity. Moisten patients mouth. Don't anticipate any problems other than slow waking from anesthesia.

## 2020-10-09 ENCOUNTER — Inpatient Hospital Stay (HOSPITAL_COMMUNITY): Payer: Medicare HMO

## 2020-10-09 LAB — GLUCOSE, CAPILLARY
Glucose-Capillary: 142 mg/dL — ABNORMAL HIGH (ref 70–99)
Glucose-Capillary: 122 mg/dL — ABNORMAL HIGH (ref 70–99)
Glucose-Capillary: 115 mg/dL — ABNORMAL HIGH (ref 70–99)
Glucose-Capillary: 116 mg/dL — ABNORMAL HIGH (ref 70–99)
Glucose-Capillary: 110 mg/dL — ABNORMAL HIGH (ref 70–99)

## 2020-10-09 LAB — CBC WITH DIFFERENTIAL/PLATELET
Abs Immature Granulocytes: 0.17 10*3/uL — ABNORMAL HIGH (ref 0.00–0.07)
Basophils Absolute: 0 10*3/uL (ref 0.0–0.1)
Basophils Relative: 0 %
Eosinophils Absolute: 0.2 10*3/uL (ref 0.0–0.5)
Eosinophils Relative: 2 %
HCT: 27.3 % — ABNORMAL LOW (ref 36.0–46.0)
Hemoglobin: 9 g/dL — ABNORMAL LOW (ref 12.0–15.0)
Immature Granulocytes: 2 %
Lymphocytes Relative: 18 %
Lymphs Abs: 2.1 10*3/uL (ref 0.7–4.0)
MCH: 31.9 pg (ref 26.0–34.0)
MCHC: 33 g/dL (ref 30.0–36.0)
MCV: 96.8 fL (ref 80.0–100.0)
Monocytes Absolute: 1.7 10*3/uL — ABNORMAL HIGH (ref 0.1–1.0)
Monocytes Relative: 15 %
Neutro Abs: 7.3 10*3/uL (ref 1.7–7.7)
Neutrophils Relative %: 63 %
Platelets: 173 10*3/uL (ref 150–400)
RBC: 2.82 MIL/uL — ABNORMAL LOW (ref 3.87–5.11)
RDW: 18.7 % — ABNORMAL HIGH (ref 11.5–15.5)
WBC: 11.5 10*3/uL — ABNORMAL HIGH (ref 4.0–10.5)
nRBC: 0.2 % (ref 0.0–0.2)

## 2020-10-09 LAB — COMPREHENSIVE METABOLIC PANEL
ALT: 23 U/L (ref 0–44)
AST: 46 U/L — ABNORMAL HIGH (ref 15–41)
Albumin: 2.3 g/dL — ABNORMAL LOW (ref 3.5–5.0)
Alkaline Phosphatase: 72 U/L (ref 38–126)
Anion gap: 9 (ref 5–15)
BUN: 72 mg/dL — ABNORMAL HIGH (ref 8–23)
CO2: 19 mmol/L — ABNORMAL LOW (ref 22–32)
Calcium: 7.9 mg/dL — ABNORMAL LOW (ref 8.9–10.3)
Chloride: 111 mmol/L (ref 98–111)
Creatinine, Ser: 1.83 mg/dL — ABNORMAL HIGH (ref 0.44–1.00)
GFR, Estimated: 26 mL/min — ABNORMAL LOW (ref >60)
Glucose, Bld: 149 mg/dL — ABNORMAL HIGH (ref 70–99)
Potassium: 3.3 mmol/L — ABNORMAL LOW (ref 3.5–5.1)
Sodium: 139 mmol/L (ref 135–145)
Total Bilirubin: 1.4 mg/dL — ABNORMAL HIGH (ref 0.3–1.2)
Total Protein: 4.7 g/dL — ABNORMAL LOW (ref 6.5–8.1)

## 2020-10-09 LAB — PREPARE RBC (CROSSMATCH)

## 2020-10-09 LAB — PROTIME-INR
INR: 1.3 — ABNORMAL HIGH (ref 0.8–1.2)
Prothrombin Time: 16 seconds — ABNORMAL HIGH (ref 11.4–15.2)

## 2020-10-09 LAB — PHOSPHORUS: Phosphorus: 3.8 mg/dL (ref 2.5–4.6)

## 2020-10-09 LAB — MAGNESIUM: Magnesium: 2.1 mg/dL (ref 1.7–2.4)

## 2020-10-09 LAB — AMMONIA: Ammonia: 16 umol/L (ref 9–35)

## 2020-10-09 MED ORDER — POTASSIUM CHLORIDE 2 MEQ/ML IV SOLN
INTRAVENOUS | Status: DC
  Filled 2020-10-09 (×3): qty 1000

## 2020-10-09 NOTE — Progress Notes (Signed)
PROGRESS NOTE    [name]  WUX:[id number] DOB: May 18, 1930 DOA: 10/07/2020 PCP: [name], MD    Brief Narrative:  86 year old female with history of hypertension, hyperlipidemia, GERD, breast cancer who was treated with chemotherapy and currently on Enhertu infusion every 3 weeks presented to the hospital with syncopal episode, multiple black melanotic stool and confusion and delirium where she was found to have acute GI bleeding with hemoglobin 6.5.  Baseline hemoglobin 10.3.  No previous history of GI bleed.  Hypotensive on arrival.  She was aggressively resuscitated and received blood transfusions.  She underwent upper GI endoscopy and found to have variceal bleeding. 12/12 :Patient was confused on arrival, continues to be encephalopathic and now more obtunded.  Ammonia was 74. 12/13: She had some response but is still not following commands.  Getting aggressive lactulose enemas.  Unable to take anything by mouth.  Assessment & Plan:   Principal Problem:   GI bleed Active Problems:   Symptomatic anemia   AKI (acute kidney injury) ([place])   Near syncope   Moderate protein-calorie malnutrition (HCC)   Leukocytosis   Acute hepatic encephalopathy   Aortic valvar stenosis  Acute upper GI bleeding/variceal bleeding/acute blood loss anemia: IV fluid resuscitation, 3 units of PRBC transfusion with stabilization of hemoglobin.  Hemoglobin more or less is stable since initial bleeding.  Continue to monitor every 12 hours.  Status post EGD, grade 2 and 3 esophageal varices, no active bleeding but recent bleeding into the stomach. Remains on Protonix infusion, octreotide infusion. Followed by gastroenterology.  Acute metabolic encephalopathy/suspected acute hepatic metabolic encephalopathy with variceal bleeding: Morphology evidence of liver cirrhosis, synthetic functions are normal.  Mild elevated INR 1.3. Patient developed profound and persistent encephalopathy, worse after  sedation. Ammonia level 73-16. CT scan of the head with no evidence of hemorrhage or acute stroke. Electrolytes are fairly normal. NG tube is contraindicated, not taking any thing by mouth. On Rocephin for prophylaxis Currently on lactulose enema. Every day liver function test, coagulation test and ammonia. Serum ammonia level does not correlate with mental status, will monitor for any worsening levels.  Acute kidney injury: Due to #1.  Remains on maintenance IV fluids with dextrose.  Check blood sugars every 4 hours and avoid hypoglycemia.  Urine output is adequate.  Leukocytosis: No evidence of active bacterial infection.  Improving.  Remains on Rocephin for hepatic encephalopathy. CT scan of the abdomen pelvis done on 10/7 with ascites, currently no tense ascites.  Will request abdominal ultrasound and ultrasound-guided paracentesis with interventional radiology if we can get any samples. Her CT scan also consistent with chronic cirrhotic morphology of the liver. Ultrasound guided paracentesis today if possible.  Severe aortic stenosis: Recent echocardiogram with severe aortic stenosis.  If patient does good clinical recovery, will send a referral to cardiology for follow-up.  Breast cancer: Patient on Enhertu infusion.  Last dose taken 12/9.  Hold further cancer treatment now.  Family communication: Patient's husband at the bedside all the time.  Multiple discussions about ongoing encephalopathy and outcome.  Patient has some minimal clinical improvement today.  We will continue current therapy.  Remains full code.  I will consult palliative care to start conversation.  DVT prophylaxis: SCDs Start: 10/07/20 2536   Code Status: Full code Family Communication: Husband at bedside Disposition Plan: Status is: Inpatient  Remains inpatient appropriate because:Hemodynamically unstable, Persistent severe electrolyte disturbances and Inpatient level of care appropriate due to severity of  illness   Dispo: The patient is  from: Home              Anticipated d/c is to: Unknown at this time              Anticipated d/c date is: > 3 days              Patient currently is not medically stable to d/c.         Consultants:   Gastroenterology  Procedures:   Upper GI endoscopy 12/11  Antimicrobials:   Rocephin, 12/11---   Subjective: Patient seen and examined.  Overnight she had some spontaneous movements.  Today morning, she was not following commands, however had some spontaneous movements and she was able to respond to some voice commands.  Objective: Vitals:   10/08/20 2208 10/08/20 2339 10/09/20 0330 10/09/20 0855  BP: (!) 159/77 (!) 176/80 (!) 160/82 (!) 154/76  Pulse: (!) 110 (!) 106 93   Resp: 18 20 18 19   Temp: 98.3 F (36.8 C) 98.4 F (36.9 C) 98.6 F (37 C) 98.5 F (36.9 C)  TempSrc: Axillary Oral Axillary Oral  SpO2: 97% 98% 99% 99%  Weight:      Height:        Intake/Output Summary (Last 24 hours) at 10/09/2020 1132 Last data filed at 10/09/2020 0700 Gross per 24 hour  Intake 2261.34 ml  Output 2100 ml  Net 161.34 ml   Filed Weights   10/07/20 0011 10/08/20 0429  Weight: 79.3 kg 84.9 kg    Examination:  General exam: Sick looking, she looks restless.  Not following commands. Respiratory system: Clear to auscultation.  Conducted airway sounds. Cardiovascular system: S1 & S2 heard, tachycardic. Gastrointestinal system: Soft.  Nondistended.  No rigidity.  No palpable ascites. Central nervous system: Patient is sleepy and lethargic.  Not following commands.  Has some spontaneous movements and responds to voice. Skin: No rashes, lesions or ulcers  Data Reviewed: I have personally reviewed following labs and imaging studies  CBC: Recent Labs  Lab 10/05/20 1202 10/07/20 0017 10/07/20 0940 10/07/20 1800 10/08/20 0527 10/08/20 1732 10/09/20 0628  WBC 9.1 13.2* 16.2* 15.3* 14.2* 13.5* 11.5*  NEUTROABS 5.0 10.1*  --   --   --   8.9* 7.3  HGB 10.3* 6.5* 10.3* 9.8* 9.5* 9.5* 9.0*  HCT 30.5* 19.8* 30.1* 28.8* 27.5* 28.0* 27.3*  MCV 102.7* 105.9* 94.4 92.6 93.5 94.3 96.8  PLT 202 212 159 169 180 188 332   Basic Metabolic Panel: Recent Labs  Lab 10/05/20 1202 10/07/20 0017 10/07/20 0940 10/08/20 0527 10/09/20 0628  NA 135 136 138 139 139  K 3.4* 3.9 4.2 4.0 3.3*  CL 105 104 109 110 111  CO2 20* 17* 20* 18* 19*  GLUCOSE 104* 194* 170* 192* 149*  BUN 21 66* 81* 93* 72*  CREATININE 1.18* 1.47* 1.40* 1.83* 1.83*  CALCIUM 8.7* 8.4* 8.2* 8.3* 7.9*  MG 1.7  --  2.3  --  2.1  PHOS  --   --   --   --  3.8   GFR: Estimated Creatinine Clearance: 22.4 mL/min (A) (by C-G formula based on SCr of 1.83 mg/dL (H)). Liver Function Tests: Recent Labs  Lab 10/05/20 1202 10/07/20 0017 10/07/20 0940 10/09/20 0628  AST 46* 42* 39 46*  ALT 24 23 21 23   ALKPHOS 155* 125 104 72  BILITOT 1.4* 0.8 1.4* 1.4*  PROT 6.1* 5.3* 4.7* 4.7*  ALBUMIN 2.8* 2.4* 2.3* 2.3*   No results for input(s): LIPASE, AMYLASE in the  last 168 hours. Recent Labs  Lab 10/07/20 1800 10/09/20 0628  AMMONIA 73* 16   Coagulation Profile: Recent Labs  Lab 10/07/20 0017 10/09/20 0628  INR 1.3* 1.3*   Cardiac Enzymes: No results for input(s): CKTOTAL, CKMB, CKMBINDEX, TROPONINI in the last 168 hours. BNP (last 3 results) No results for input(s): PROBNP in the last 8760 hours. HbA1C: No results for input(s): HGBA1C in the last 72 hours. CBG: Recent Labs  Lab 10/08/20 1713 10/08/20 1952 10/08/20 2357 10/09/20 0454 10/09/20 0833  GLUCAP 146* 131* 136* 142* 122*   Lipid Profile: No results for input(s): CHOL, HDL, LDLCALC, TRIG, CHOLHDL, LDLDIRECT in the last 72 hours. Thyroid Function Tests: No results for input(s): TSH, T4TOTAL, FREET4, T3FREE, THYROIDAB in the last 72 hours. Anemia Panel: No results for input(s): VITAMINB12, FOLATE, FERRITIN, TIBC, IRON, RETICCTPCT in the last 72 hours. Sepsis Labs: No results for input(s):  PROCALCITON, LATICACIDVEN in the last 168 hours.  Recent Results (from the past 240 hour(s))  Resp Panel by RT-PCR (Flu A&B, Covid) Nasopharyngeal Swab     Status: None   Collection Time: 10/07/20  1:11 AM   Specimen: Nasopharyngeal Swab; Nasopharyngeal(NP) swabs in vial transport medium  Result Value Ref Range Status   SARS Coronavirus 2 by RT PCR NEGATIVE NEGATIVE Final    Comment: (NOTE) SARS-CoV-2 target nucleic acids are NOT DETECTED.  The SARS-CoV-2 RNA is generally detectable in upper respiratory specimens during the acute phase of infection. The lowest concentration of SARS-CoV-2 viral copies this assay can detect is 138 copies/mL. A negative result does not preclude SARS-Cov-2 infection and should not be used as the sole basis for treatment or other patient management decisions. A negative result may occur with  improper specimen collection/handling, submission of specimen other than nasopharyngeal swab, presence of viral mutation(s) within the areas targeted by this assay, and inadequate number of viral copies(<138 copies/mL). A negative result must be combined with clinical observations, patient history, and epidemiological information. The expected result is Negative.  Fact Sheet for Patients:  EntrepreneurPulse.com.au  Fact Sheet for Healthcare Providers:  IncredibleEmployment.be  This test is no t yet approved or cleared by the [place] FDA and  has been authorized for detection and/or diagnosis of SARS-CoV-2 by FDA under an Emergency Use Authorization (EUA). This EUA will remain  in effect (meaning this test can be used) for the duration of the COVID-19 declaration under Section 564(b)(1) of the Act, 21 U.S.C.section 360bbb-3(b)(1), unless the authorization is terminated  or revoked sooner.       Influenza A by PCR NEGATIVE NEGATIVE Final   Influenza B by PCR NEGATIVE NEGATIVE Final    Comment: (NOTE) The Xpert Xpress  SARS-CoV-2/FLU/RSV plus assay is intended as an aid in the diagnosis of influenza from Nasopharyngeal swab specimens and should not be used as a sole basis for treatment. Nasal washings and aspirates are unacceptable for Xpert Xpress SARS-CoV-2/FLU/RSV testing.  Fact Sheet for Patients: EntrepreneurPulse.com.au  Fact Sheet for Healthcare Providers: IncredibleEmployment.be  This test is not yet approved or cleared by the [place] FDA and has been authorized for detection and/or diagnosis of SARS-CoV-2 by FDA under an Emergency Use Authorization (EUA). This EUA will remain in effect (meaning this test can be used) for the duration of the COVID-19 declaration under Section 564(b)(1) of the Act, 21 U.S.C. section 360bbb-3(b)(1), unless the authorization is terminated or revoked.  Performed at [facility], [address], [place], [place] [address]  Radiology Studies: CT HEAD WO CONTRAST  Result Date: 10/07/2020 CLINICAL DATA:  Altered mental status. EXAM: CT HEAD WITHOUT CONTRAST TECHNIQUE: Contiguous axial images were obtained from the base of the skull through the vertex without intravenous contrast. COMPARISON:  None. FINDINGS: Brain: No evidence of acute infarction, hemorrhage, hydrocephalus, extra-axial collection or mass lesion/mass effect. Vascular: No hyperdense vessel or unexpected calcification. Skull: Normal. Negative for fracture or focal lesion. Sinuses/Orbits: No acute finding. Other: None. IMPRESSION: Normal head CT. Electronically Signed   By: Marijo Conception M.D.   On: 10/07/2020 15:34        Scheduled Meds: . Chlorhexidine Gluconate Cloth  6 each Topical Daily  . lactulose  300 mL Rectal QID  . [START ON 10/10/2020] pantoprazole  40 mg Intravenous Q12H   Continuous Infusions: . sodium chloride    . sodium chloride    . sodium chloride 10 mL/hr (10/07/20 1813)  . cefTRIAXone (ROCEPHIN)  IV 2 g (10/08/20 1639)   . dextrose 5 % with kcl    . octreotide  (SANDOSTATIN)    IV infusion 50 mcg/hr (10/08/20 1648)  . pantoprozole (PROTONIX) infusion 8 mg/hr (10/09/20 1115)     LOS: 2 days    Time spent: 40 minutes    Barb Merino, MD [facility] Pager [phone]

## 2020-10-09 NOTE — Progress Notes (Signed)
Subjective: No overt GI bleeding per staff. Remains nearly obtunded. Grimacing at times with physical stimuli.   Objective: Vital signs in last 24 hours: Temp:  [98 F (36.7 C)-99 F (37.2 C)] 98.5 F (36.9 C) (12/13 0855) Pulse Rate:  [93-113] 93 (12/13 0330) Resp:  [18-20] 19 (12/13 0855) BP: (154-176)/(72-90) 154/76 (12/13 0855) SpO2:  [97 %-100 %] 99 % (12/13 0855) Last BM Date: 10/08/20 General:   Unresponsive to verbal stimuli,  Head:  Normocephalic and atraumatic. Abdomen:  Bowel sounds present, soft, obese, non-tender, non-distended. Laying on side so exam is difficult but does not appear to have obvious tense ascites Extremities:  Without edema. Neurologic:  Remains nearly obtunded but slowed response to physical stimuli   Intake/Output from previous day: 12/12 0701 - 12/13 0700 In: 2905.9 [I.V.:1905.9] Out: 2100 [Urine:1100; Stool:1000] Intake/Output this shift: No intake/output data recorded.  Lab Results: Recent Labs    10/08/20 0527 10/08/20 1732 10/09/20 0628  WBC 14.2* 13.5* 11.5*  HGB 9.5* 9.5* 9.0*  HCT 27.5* 28.0* 27.3*  PLT 180 188 173   BMET Recent Labs    10/07/20 0940 10/08/20 0527 10/09/20 0628  NA 138 139 139  K 4.2 4.0 3.3*  CL 109 110 111  CO2 20* 18* 19*  GLUCOSE 170* 192* 149*  BUN 81* 93* 72*  CREATININE 1.40* 1.83* 1.83*  CALCIUM 8.2* 8.3* 7.9*   LFT Recent Labs    10/07/20 0017 10/07/20 0940 10/09/20 0628  PROT 5.3* 4.7* 4.7*  ALBUMIN 2.4* 2.3* 2.3*  AST 42* 39 46*  ALT 23 21 23   ALKPHOS 125 104 72  BILITOT 0.8 1.4* 1.4*   PT/INR Recent Labs    10/07/20 0017 10/09/20 0628  LABPROT 16.0* 16.0*  INR 1.3* 1.3*     Studies/Results: CT HEAD WO CONTRAST  Result Date: 10/07/2020 CLINICAL DATA:  Altered mental status. EXAM: CT HEAD WITHOUT CONTRAST TECHNIQUE: Contiguous axial images were obtained from the base of the skull through the vertex without intravenous contrast. COMPARISON:  None. FINDINGS: Brain:  No evidence of acute infarction, hemorrhage, hydrocephalus, extra-axial collection or mass lesion/mass effect. Vascular: No hyperdense vessel or unexpected calcification. Skull: Normal. Negative for fracture or focal lesion. Sinuses/Orbits: No acute finding. Other: None. IMPRESSION: Normal head CT. Electronically Signed   By: Marijo Conception M.D.   On: 10/07/2020 15:34    Assessment: 86 year old female with history of cirrhosis (MELD Na 16) and admitted with UGI bleed but incomplete EGD this admission due to old blood in proximal stomach precluding full evaluation. Varices noted but without obvious stigmata of recent bleeding. Esophagitis also present; unable to exclude MW tear coexisting or occult lesions not appreciated due to limited visualization. Now developing worsening encephalopathy and obtunded yesterday.   Discussed with hospitalist, who feels she has had slight improvement today although remains nearly obtunded. Ammonia now normalized. She has slowed response to physical stimuli but some "stirring" today when seen at bedside by myself.   No overt GI bleeding noted. Kelly to continue on lactulose enemas as she is unsafe to take in oral medications in light of altered mental status.   [name] with fluid analysis has been ordered today by hospitalist. Does not appear to have obvious tense ascites but imaging in past did note moderate volume ascites.   Prognosis is guarded at this point. Continuing octreotide for now, PPI, and antibiotic therapy in setting of GI bleed and cirrhosis.   Plan: Continue supportive measures Continue lactulose enemas Monitor airway  Continue octreotide and PPI infusion Continue empiric antibiotic therapy (started 12/11).  Transfuse as needed CBC, CMP, INR in am Follow-up on fluid analysis from Kelly Agree with palliative consultation   Adrienne Needs, PhD, ANP-BC [facility]    LOS: 2 days    10/09/2020, 10:32 AM

## 2020-10-10 DIAGNOSIS — N179 Acute kidney failure, unspecified: Secondary | ICD-10-CM

## 2020-10-10 DIAGNOSIS — Z7189 Other specified counseling: Secondary | ICD-10-CM

## 2020-10-10 DIAGNOSIS — K7200 Acute and subacute hepatic failure without coma: Secondary | ICD-10-CM

## 2020-10-10 DIAGNOSIS — Z515 Encounter for palliative care: Secondary | ICD-10-CM

## 2020-10-10 LAB — COMPREHENSIVE METABOLIC PANEL
ALT: 23 U/L (ref 0–44)
AST: 44 U/L — ABNORMAL HIGH (ref 15–41)
Albumin: 2.3 g/dL — ABNORMAL LOW (ref 3.5–5.0)
Alkaline Phosphatase: 77 U/L (ref 38–126)
Anion gap: 7 (ref 5–15)
BUN: 50 mg/dL — ABNORMAL HIGH (ref 8–23)
CO2: 21 mmol/L — ABNORMAL LOW (ref 22–32)
Calcium: 8 mg/dL — ABNORMAL LOW (ref 8.9–10.3)
Chloride: 113 mmol/L — ABNORMAL HIGH (ref 98–111)
Creatinine, Ser: 1.51 mg/dL — ABNORMAL HIGH (ref 0.44–1.00)
GFR, Estimated: 33 mL/min — ABNORMAL LOW (ref >60)
Glucose, Bld: 122 mg/dL — ABNORMAL HIGH (ref 70–99)
Potassium: 3.3 mmol/L — ABNORMAL LOW (ref 3.5–5.1)
Sodium: 141 mmol/L (ref 135–145)
Total Bilirubin: 1.5 mg/dL — ABNORMAL HIGH (ref 0.3–1.2)
Total Protein: 4.8 g/dL — ABNORMAL LOW (ref 6.5–8.1)

## 2020-10-10 LAB — CBC WITH DIFFERENTIAL/PLATELET
Abs Immature Granulocytes: 0.17 10*3/uL — ABNORMAL HIGH (ref 0.00–0.07)
Basophils Absolute: 0 10*3/uL (ref 0.0–0.1)
Basophils Relative: 0 %
Eosinophils Absolute: 0.7 10*3/uL — ABNORMAL HIGH (ref 0.0–0.5)
Eosinophils Relative: 7 %
HCT: 27.2 % — ABNORMAL LOW (ref 36.0–46.0)
Hemoglobin: 8.7 g/dL — ABNORMAL LOW (ref 12.0–15.0)
Immature Granulocytes: 2 %
Lymphocytes Relative: 21 %
Lymphs Abs: 2 10*3/uL (ref 0.7–4.0)
MCH: 31.3 pg (ref 26.0–34.0)
MCHC: 32 g/dL (ref 30.0–36.0)
MCV: 97.8 fL (ref 80.0–100.0)
Monocytes Absolute: 1.2 10*3/uL — ABNORMAL HIGH (ref 0.1–1.0)
Monocytes Relative: 12 %
Neutro Abs: 5.6 10*3/uL (ref 1.7–7.7)
Neutrophils Relative %: 58 %
Platelets: 166 10*3/uL (ref 150–400)
RBC: 2.78 MIL/uL — ABNORMAL LOW (ref 3.87–5.11)
RDW: 18.1 % — ABNORMAL HIGH (ref 11.5–15.5)
WBC: 9.6 10*3/uL (ref 4.0–10.5)
nRBC: 0.2 % (ref 0.0–0.2)

## 2020-10-10 LAB — PROTIME-INR
INR: 1.2 (ref 0.8–1.2)
Prothrombin Time: 15.1 seconds (ref 11.4–15.2)

## 2020-10-10 LAB — GLUCOSE, CAPILLARY
Glucose-Capillary: 119 mg/dL — ABNORMAL HIGH (ref 70–99)
Glucose-Capillary: 105 mg/dL — ABNORMAL HIGH (ref 70–99)
Glucose-Capillary: 107 mg/dL — ABNORMAL HIGH (ref 70–99)
Glucose-Capillary: 134 mg/dL — ABNORMAL HIGH (ref 70–99)
Glucose-Capillary: 134 mg/dL — ABNORMAL HIGH (ref 70–99)

## 2020-10-10 LAB — AMMONIA: Ammonia: 43 umol/L — ABNORMAL HIGH (ref 9–35)

## 2020-10-10 MED ORDER — OCTREOTIDE LOAD VIA INFUSION
50.0000 ug | Freq: Once | INTRAVENOUS | Status: AC
  Administered 2020-10-10: 50 ug via INTRAVENOUS
  Filled 2020-10-10: qty 25

## 2020-10-10 MED ORDER — RIFAXIMIN 550 MG PO TABS
550.0000 mg | ORAL_TABLET | Freq: Three times a day (TID) | ORAL | Status: DC
  Administered 2020-10-10 – 2020-10-11 (×5): 550 mg via ORAL
  Filled 2020-10-10 (×5): qty 1

## 2020-10-10 MED ORDER — PROPRANOLOL HCL 20 MG PO TABS
40.0000 mg | ORAL_TABLET | Freq: Two times a day (BID) | ORAL | Status: DC
  Administered 2020-10-10: 40 mg via ORAL
  Filled 2020-10-10: qty 2

## 2020-10-10 MED ORDER — LACTULOSE 10 GM/15ML PO SOLN
20.0000 g | Freq: Three times a day (TID) | ORAL | Status: DC
  Administered 2020-10-10 – 2020-10-12 (×5): 20 g via ORAL
  Filled 2020-10-10 (×6): qty 30

## 2020-10-10 MED ORDER — NADOLOL 20 MG PO TABS
20.0000 mg | ORAL_TABLET | Freq: Every day | ORAL | Status: DC
  Administered 2020-10-11 – 2020-10-12 (×2): 20 mg via ORAL
  Filled 2020-10-10 (×6): qty 1

## 2020-10-10 MED ORDER — PROPRANOLOL HCL 20 MG PO TABS: 20.0000 mg | ORAL_TABLET | Freq: Two times a day (BID) | ORAL | Status: DC

## 2020-10-10 MED ORDER — SODIUM CHLORIDE 0.9 % IV SOLN
1.0000 g | INTRAVENOUS | Status: DC
  Administered 2020-10-10 – 2020-10-11 (×2): 1 g via INTRAVENOUS
  Filled 2020-10-10 (×3): qty 10

## 2020-10-10 MED ORDER — POTASSIUM CHLORIDE CRYS ER 20 MEQ PO TBCR
20.0000 meq | EXTENDED_RELEASE_TABLET | Freq: Two times a day (BID) | ORAL | Status: AC
  Administered 2020-10-10 – 2020-10-11 (×4): 20 meq via ORAL
  Filled 2020-10-10 (×4): qty 1

## 2020-10-10 MED ORDER — SODIUM CHLORIDE 0.9 % IV SOLN
25.0000 ug/h | INTRAVENOUS | Status: DC
  Administered 2020-10-10: 50 ug/h via INTRAVENOUS
  Filled 2020-10-10 (×5): qty 1

## 2020-10-10 NOTE — Consult Note (Signed)
Consultation Note Date: 10/10/2020   Patient Name: [name]  DOB: 02-12-1930  MRN: [id number]  Age / Sex: 86 y.o., female  PCP: [name], MD Referring Physician: Barb Merino, MD  Reason for Consultation: Establishing goals of care  HPI/Patient Profile: 86 y.o. female  with past medical history of hypertension, hyperlipidemia, GERD, breast cancer being treated on maintenance [place] admitted on 10/07/2020 with syncopal episode.  Work-up revealed GI bleed due to esophageal varices, hepatic cirrhosis, hepatic encephalopathy, elevated ammonia.  She is being treated with lactulose enemas.  Status is very slowly improving.  I did medicine consulted for goals of care.  Clinical Assessment and Goals of Care:  I have reviewed medical records including EPIC notes, labs and imaging, examined the patient and met at bedside with her spouse- Ron to discuss diagnosis prognosis, [place], EOL wishes, disposition and options.  Hikari was oriented to person and place- however, had limited insight into her current medical situation- she has some residual confusion likely from her elevated ammonia. She was not able to participate in [place]- so discussion was had with her spouse.   I introduced Palliative Medicine as specialized medical care for people living with serious illness. It focuses on providing relief from the symptoms and stress of a serious illness.   We discussed a brief life review of the patient. She lives at home with her husband they have been married for over 78 years. They met at church. They are [place].  As far as functional and nutritional status -prior to this admission she was living at home independently, completed all her ADLs on her own with some supervision from her husband when getting into the shower. No recent changes in her nutritional status.  We discussed her current illness and what it means in  the larger context of her on-going co-morbidities.  Natural disease trajectory and expectations at EOL were discussed. We discussed that liver cirrhosis is a chronic disease that can get worse over time with general overall decline. Patient spouse expressed that if patient were to return to a state where she was prior to this admission that he would likely choose to transition her to comfort measures and allow her to go.  I attempted to elicit values and goals of care important to the patient. Living at home, independently with her husband is a primary goal. She would not want to live in a nursing facility, or go to a nursing facility for rehab.  The difference between aggressive medical intervention and comfort care was considered in light of the patient's goals of care. Aysha is currently improving with current interventions, however her hemoglobin is drifting down. Family does not want to repeat endoscopy at this point, but would be acceptable to future blood transfusions if needed.   Advanced directives, concepts specific to code status, artifical feeding and hydration, and rehospitalization were considered and discussed. A MOST form was completed with selections of DNR, comfort measures only if patient was severely declining and likely at end-of-life-consider treating her limiting antibiotic  treatment when event occurs, IV fluids for trial period, IV fluids for a trial period.   Hospice and Palliative Care services outpatient were explained and offered.  Ron agrees for follow-up from outpatient palliative provider.  Questions and concerns were addressed.  The family was encouraged to call with questions or concerns.   Primary Decision Maker NEXT OF KIN- patient's spouse- Ron    SUMMARY OF RECOMMENDATIONS -DNR -Continue current scope of care -MOST form completed and in VYNCA -D/C home with home health/Palliative  Code Status/Advance Care Planning:  DNR  Additional Recommendations  (Limitations, Scope, Preferences):  Full Scope Treatment  Prognosis:    Unable to determine  Discharge Planning: Home with Palliative Services  Primary Diagnoses: Present on Admission: . GI bleed . Symptomatic anemia . AKI (acute kidney injury) ([place]) . Near syncope . Moderate protein-calorie malnutrition ([place]) . Leukocytosis . Acute hepatic encephalopathy   I have reviewed the medical record, interviewed the patient and family, and examined the patient. The following aspects are pertinent.  Past Medical History:  Diagnosis Date  . Cancer ([place]) 06/2018   right breast cancer  . GERD (gastroesophageal reflux disease)    occasional   . Hyperlipidemia   . Hypertension    clearance with note Dr Nevada Crane on chart  . Pneumonia    2 years ago/ states occ cough with sinus drainage- no fever  . Thyroid nodule    with biopsy- states following medically   Social History   Socioeconomic History  . Marital status: Married    Spouse name: Not on file  . Number of children: 2  . Years of education: some business school after HS  . Highest education level: Not on file  Occupational History  . Occupation: Cabin crew  . Occupation: Network engineer   Tobacco Use  . Smoking status: Never Smoker  . Smokeless tobacco: Never Used  Vaping Use  . Vaping Use: Never used  Substance and Sexual Activity  . Alcohol use: No  . Drug use: No  . Sexual activity: Yes    Birth control/protection: None  Other Topics Concern  . Not on file  Social History Narrative   Lives at home with her husband.   4 cups caffeine per day.   Right-handed.   Social Determinants of Health   Financial Resource Strain: Not on file  Food Insecurity: Not on file  Transportation Needs: Not on file  Physical Activity: Not on file  Stress: Not on file  Social Connections: Not on file   Scheduled Meds: . Chlorhexidine Gluconate Cloth  6 each Topical Daily  . lactulose  20 g Oral TID  . pantoprazole  40 mg Intravenous  Q12H  . potassium chloride  20 mEq Oral BID  . propranolol  40 mg Oral BID  . rifaximin  550 mg Oral TID   Continuous Infusions: . sodium chloride    . sodium chloride    . sodium chloride 10 mL/hr (10/07/20 1813)   PRN Meds:.acetaminophen **OR** acetaminophen, ondansetron **OR** ondansetron (ZOFRAN) IV Medications Prior to Admission:  Prior to Admission medications   Medication Sig Start Date End Date Taking? Authorizing Provider  acetaminophen (TYLENOL) 325 MG tablet Take 650 mg by mouth every 6 (six) hours as needed for mild pain.   Yes [provider]  aspirin 325 MG tablet Take 162.5 mg by mouth daily.    Yes [provider]  cetirizine (ZYRTEC) 10 MG tablet Take 10 mg by mouth daily.   Yes [provider]  furosemide (LASIX) 20 MG tablet Take 1 tablet (20 mg total) by mouth daily. Patient taking differently: Take 20 mg by mouth every other day. 08/08/20  Yes Derek Jack, MD  Multiple Vitamin (MULTIVITAMIN WITH MINERALS) TABS tablet Take 1 tablet by mouth daily.   Yes [provider]  hydrocortisone 1 % ointment Apply 1 application topically 2 (two) times daily. Patient not taking: No sig reported 09/18/18   Francene Finders L, NP-C  lidocaine-prilocaine (EMLA) cream Apply to port site one hour prior to appointment and cover with plastic wrap. Patient not taking: No sig reported 07/29/18   Derek Jack, MD  Misc. Devices MISC Please provide lymphedema sleeve for right arm 11/03/18   Derek Jack, MD  [facility] ULTRA test strip USE 1 STRIP TO CHECK GLUCOSE 2 TO 3 TIMES DAILY 04/21/19   [provider]   Allergies  Allergen Reactions  . Penicillins Rash    Has patient had a PCN reaction causing immediate rash, facial/tongue/throat swelling, SOB or lightheadedness with hypotension: No Has patient had a PCN reaction causing severe rash involving mucus membranes or skin necrosis: No Has patient had a PCN reaction that  required hospitalization: No Has patient had a PCN reaction occurring within the last 10 years: No If all of the above answers are "NO", then may proceed with Cephalosporin use.    Review of Systems  Constitutional: Positive for activity change and fatigue.  Respiratory: Negative for shortness of breath.   Gastrointestinal: Negative for abdominal pain.    Physical Exam Vitals and nursing note reviewed.  Neurological:     Mental Status: She is alert.     Comments: Very hard of hearing- oriented to person and place     Vital Signs: BP (!) 126/47 (BP Location: Right Arm)   Pulse 71   Temp 98.3 F (36.8 C) (Oral)   Resp 16   Ht 5' 4" (1.626 m)   Wt 84.9 kg   SpO2 98%   BMI 32.13 kg/m  Pain Scale: PAINAD   Pain Score: 0-No pain   SpO2: SpO2: 98 % O2 Device:SpO2: 98 % O2 Flow Rate: .O2 Flow Rate (L/min): 3 L/min  IO: Intake/output summary:   Intake/Output Summary (Last 24 hours) at 10/10/2020 1150 Last data filed at 10/10/2020 0700 Gross per 24 hour  Intake --  Output 800 ml  Net -800 ml    LBM: Last BM Date: 10/09/20 Baseline Weight: Weight: 79.3 kg Most recent weight: Weight: 84.9 kg     Palliative Assessment/Data: PPS: 40%     Thank you for this consult. Palliative medicine will continue to follow and assist as needed.   Time In: 1201 Time Out: 1321 Time Total: 80 mins Greater than 50%  of this time was spent counseling and coordinating care related to the above assessment and plan.  Signed by: Mariana Kaufman, AGNP-C Palliative Medicine    Please contact Palliative Medicine Team phone at [phone] for questions and concerns.  For individual provider: See Shea Evans

## 2020-10-10 NOTE — Progress Notes (Signed)
Palliative- Thank you for this consult- chart reviewed- meeting with patient's husband at 12N today.   Mariana Kaufman, AGNP-C Palliative Medicine  Please call Palliative Medicine team phone with any questions [phone]. For individual providers please see AMION.  No charge

## 2020-10-10 NOTE — TOC Initial Note (Signed)
Transition of Care [facility]) - Initial/Assessment Note   Patient Details  Name: [name] MRN: [id number] Date of Birth: September 15, 1930  [facility]) CM/SW Contact:    Sherie Don, LCSW Phone Number: 10/10/2020, 2:13 PM  Clinical Narrative: Patient is an 86 year old female who was admitted for GI bleed. TOC received consult for OP palliative referral. CSW met with patient and her husband to discuss palliative referral and review choices. Family chose [facility]. CSW made referral to Cass[facility] with [facility]. TOC to follow.  Expected Discharge Plan: [place] Barriers to Discharge: Continued Medical Work up  Patient Goals and CMS Choice Patient states their goals for this hospitalization and ongoing recovery are:: Return home CMS Medicare.gov Compare Post Acute Care list provided to:: Patient Choice offered to / list presented to : [facility]  Expected Discharge Plan and Services Expected Discharge Plan: [place] In-house Referral: Clinical Social Work Discharge Planning Services: NA Post Acute Care Choice:  (OP palliative) Living arrangements for the past 2 months: Single Family Home              DME Arranged: N/A DME Agency: NA  Prior Living Arrangements/Services Living arrangements for the past 2 months: [place] Lives with:: Spouse Patient language and need for interpreter reviewed:: Yes Do you feel safe going back to the place where you live?: Yes      Need for Family Participation in Patient Care: Yes (Comment) Care giver support system in place?: Yes (comment) Criminal Activity/Legal Involvement Pertinent to Current Situation/Hospitalization: No - Comment as needed  Activities of Daily Living Home Assistive Devices/Equipment: Cane (specify quad or straight),Walker (specify type) ADL Screening (condition at time of admission) Patient's cognitive ability adequate to safely complete daily  activities?: Yes Is the patient deaf or have difficulty hearing?: No Does the patient have difficulty seeing, even when wearing glasses/contacts?: No Does the patient have difficulty concentrating, remembering, or making decisions?: Yes Patient able to express need for assistance with ADLs?: Yes Does the patient have difficulty dressing or bathing?: No Independently performs ADLs?: Yes (appropriate for developmental age) Does the patient have difficulty walking or climbing stairs?: Yes Weakness of Legs: Both Weakness of Arms/Hands: Both  Permission Sought/Granted Permission sought to share information with : Chartered certified accountant granted to share information with : Yes, Verbal Permission Granted Permission granted to share info w AGENCY: [facility] for OP palliative  Emotional Assessment Appearance:: Appears stated age Attitude/Demeanor/Rapport: Engaged,Lethargic Affect (typically observed): Accepting Orientation: : Oriented to Self,Oriented to Place,Oriented to  Time,Oriented to Situation Alcohol / Substance Use: Not Applicable Psych Involvement: No (comment)  Admission diagnosis:  GI bleed [K92.2] Patient Active Problem List   Diagnosis Date Noted  . Acute hepatic encephalopathy 10/08/2020  . Aortic valvar stenosis 10/08/2020  . GI bleed 10/07/2020  . AKI (acute kidney injury) ([place]) 10/07/2020  . Near syncope 10/07/2020  . Moderate protein-calorie malnutrition ([place]) 10/07/2020  . Leukocytosis 10/07/2020  . Goals of care, counseling/discussion 02/15/2019  . Malignant neoplasm of upper-outer quadrant of right breast in female, estrogen receptor negative ([place]) 07/15/2018  . Peripheral polyneuropathy 12/12/2017  . Paresthesia 11/24/2017  . Symptomatic anemia 12/19/2011  . Postop Hyponatremia 12/17/2011  . OA (osteoarthritis) of knee 12/16/2011  . KNEE, ARTHRITIS, DEGEN./OSTEO 10/10/2010  . TEAR MEDIAL MENISCUS 10/10/2010  . TEAR LATERAL  MENISCUS 10/10/2010   PCP:  [name], MD Pharmacy:  [place], [place] - [id number] [place] [address] Phone: [phone] Fax: [phone]  Readmission Risk Interventions No flowsheet data found.

## 2020-10-10 NOTE — Progress Notes (Addendum)
Subjective: Feeling well today. She is alert and oriented x4. Takes a little time to think and respond when evaluating orientation, but answers appropriately. No abdominal pain, nausea, or vomiting. Husband at bedside states rectal tube was removed this morning. Had liquid brown stool. He was feeding her applesauce as I walked in the room.     Objective: Vital signs in last 24 hours: Temp:  [97.9 F (36.6 C)-99 F (37.2 C)] 97.9 F (36.6 C) (12/14 1227) Pulse Rate:  [70-76] 70 (12/14 1227) Resp:  [16-18] 16 (12/14 0500) BP: (115-146)/(47-79) 146/62 (12/14 1227) SpO2:  [98 %-100 %] 99 % (12/14 1227) Last BM Date: 10/09/20 General:   Alert and oriented, pleasant, no acute distress.  Head:  Normocephalic and atraumatic. Eyes:  No icterus, sclera clear. Conjuctiva pink.  Abdomen:  Bowel sounds present, soft, non-tender, non-distended. No HSM or hernias noted. No rebound or guarding. No masses appreciated  Msk:  Symmetrical without gross deformities. Extremities:  Without edema. Neurologic:  Alert and  oriented x4. Slight asterixis.  Psych:  Normal mood and affect.  Intake/Output from previous day: 12/13 0701 - 12/14 0700 In: -  Out: 800 [Urine:800] Intake/Output this shift: No intake/output data recorded.  Lab Results: Recent Labs    10/08/20 1732 10/09/20 0628 10/10/20 0604  WBC 13.5* 11.5* 9.6  HGB 9.5* 9.0* 8.7*  HCT 28.0* 27.3* 27.2*  PLT 188 173 166   BMET Recent Labs    10/08/20 0527 10/09/20 0628 10/10/20 0604  NA 139 139 141  K 4.0 3.3* 3.3*  CL 110 111 113*  CO2 18* 19* 21*  GLUCOSE 192* 149* 122*  BUN 93* 72* 50*  CREATININE 1.83* 1.83* 1.51*  CALCIUM 8.3* 7.9* 8.0*   LFT Recent Labs    10/09/20 0628 10/10/20 0604  PROT 4.7* 4.8*  ALBUMIN 2.3* 2.3*  AST 46* 44*  ALT 23 23  ALKPHOS 72 77  BILITOT 1.4* 1.5*   PT/INR Recent Labs    10/09/20 0628 10/10/20 0604  LABPROT 16.0* 15.1  INR 1.3* 1.2   Studies/Results: [place] ASCITES (ABDOMEN  LIMITED)  Result Date: 10/09/2020 CLINICAL DATA:  Ascites EXAM: LIMITED ABDOMEN ULTRASOUND FOR ASCITES TECHNIQUE: Limited ultrasound survey for ascites was performed in all four abdominal quadrants. COMPARISON:  CT abdomen and pelvis 08/03/2020 FINDINGS: Minimal ascites seen in the mid abdomen. No significant ascites is identified to permit paracentesis. IMPRESSION: Insufficient ascites for paracentesis. Electronically Signed   By: Lavonia Dana M.D.   On: 10/09/2020 15:23    Assessment: 86 year old female with history of cirrhosis (MELD 14) and admitted with UGI bleed but incomplete EGD on 12/11 with old blood in the stomach precluding full evaluation. Varices noted but without obvious stigmata of recent bleeding. Esophagitis also present; unable to exclude MW tear coexisting or occult lesions not appreciated due to limited visualization. Recommended repeat EGD in 2 days. However, she developing worsening encephalopathy and became obtunded on 12/12.   Upper GI bleed: Patient presented with melena.  Hemoglobin 6.5 on admission.  She received 3 units PRBCs with posttransfusion hemoglobin 10.3. EGD 12/11 incomplete as per above with recommendations to repeat in 2 days as etiology was not identified. She will likely benefit from variceal banding.   She completed octreotide infusion and PPI infusion x72 hours and has been transitioned to IV PPI twice daily today.  Started on propranolol for variceal bleed prophylaxis by hospitalist today. Hemoglobin slowly drifting down over the last few days with hemoglobin 8.7 this morning.  No overt GI bleeding.  BUN trending down from 93 on 12/12 to 50 today. Slow downward trend in hemoglobin may be secondary to equilibration.  Discussed consideration of repeat EGD as previously recommended by Dr. Gala Romney.  Per patient and her husband, they prefer to hold off on repeat EGD unless absolutely necessary. Notably, she has been on empiric antibiotics for SBP prophylaxis started  12/11.  Appears antibiotic was discontinued today; however, she needs to complete a 7-day course.  Encephalopathy: In the setting of cirrhosis with upper GI bleed. Attempted para yesterday, but not enough fluid. Received lactulose enemas 12/12-12/13 and mental status is much improved today.  She takes some time to respond when assessing orientation, but she is alert and oriented x4.  Very slight asterixis on exam today.  Lactulose enemas have been discontinued. Transitioned to oral lactulose 3 times daily and Xifaxan twice daily today. Ammonia elevated at 43 today.  Continue current medications and monitor for worsening.  Plan: 1.  Clear Liquids today.  2.  Oral Lactulose TID with goal of 3-4 BMs daily.  3.  Zifaxan 550 BID 4.  Monitor closely for worsening mental status. 5.  Continue IV PPI twice daily. 6.  Resume ceftriaxone 2 g daily for SBP prophylaxis.  She will need to complete a 7-day course. (Antibiotic started 12/11) 7.  Monitor for overt GI bleeding. 8.  Continue to monitor H/H and transfuse as necessary. 9.  Repeat CBC, CMP, INR in a.m. 10.  Discussed repeat EGD with patient and her husband today.  They prefer not to have repeat EGD unless absolutely necessary. 11. Continue propranolol for variceal bleed prophylaxis.  12  Continue supportive measures. 13. Agree with palliative consultation.   LOS: 3 days    10/10/2020, 3:03 PM   Aliene Altes, [facility]

## 2020-10-10 NOTE — Progress Notes (Signed)
PROGRESS NOTE    [name]  IRS:[id number] DOB: 1930-08-14 DOA: 10/07/2020 PCP: [name], MD    Brief Narrative:  86 year old female with history of hypertension, hyperlipidemia, GERD, breast cancer who was treated with chemotherapy and currently on Enhertu infusion every 3 weeks presented to the hospital with syncopal episode, multiple black melanotic stool and confusion and delirium where she was found to have acute GI bleeding with hemoglobin 6.5.  Baseline hemoglobin 10.3.  No previous history of GI bleed.  Hypotensive on arrival.  She was aggressively resuscitated and received blood transfusions.  She underwent upper GI endoscopy and found to have variceal bleeding.  12/12 :Patient was confused on arrival, continues to be encephalopathic and now more obtunded.  Ammonia was 74.  Remained obtunded for about 24 hours, aggressively treated with rectal lactulose. 12/14, she started responding and taking oral medications.  Assessment & Plan:   Principal Problem:   GI bleed Active Problems:   Symptomatic anemia   AKI (acute kidney injury) ([place])   Near syncope   Moderate protein-calorie malnutrition (HCC)   Leukocytosis   Acute hepatic encephalopathy   Aortic valvar stenosis  Acute upper GI bleeding/variceal bleeding/acute blood loss anemia: IV fluid resuscitation, 3 units of PRBC transfusion with stabilization of hemoglobin.  Hemoglobin more or less is stable since initial bleeding.  Continue monitoring daily. Status post EGD, grade 2 and 3 esophageal varices, no active bleeding but recent bleeding into the stomach. Treated with Protonix infusion and octreotide for 72 hours. Will change to Protonix 40 mg IV twice daily, discontinue octreotide today. Followed by gastroenterology. May need to repeat upper endoscopy, however with recent severe encephalopathy, may want to wait for few days. We will start rifaximin and propranolol.  Acute metabolic encephalopathy due to acute  hepatic metabolic encephalopathy with variceal bleeding: Morphology evidence of liver cirrhosis, synthetic functions are normal.  Mild elevated INR 1.3 that is normalized now. Patient developed profound and persistent encephalopathy, worse after sedation. Ammonia level 73-16 and normalized. CT scan of the head with no evidence of hemorrhage or acute stroke. Electrolytes are fairly normal. Treated with IV Rocephin, lactulose enema.  Clinically improved today. Start oral lactulose with goal to have 2-3 loose bowel movements a day. Discontinue Rocephin, will start on rifaximin. No ascites fluid on ultrasound.  Acute kidney injury: Due to #1.  Stabilizing.  Urine output is adequate.  Encourage oral intake today.  Severe aortic stenosis: Recent echocardiogram with severe aortic stenosis.  If patient does good clinical recovery, will send a referral to cardiology for follow-up.  Need to send a referral on discharge to cardiology.  Breast cancer: Patient on Enhertu infusion.  Last dose taken 12/9.  Hold further cancer treatment now.  Family communication: Patient's husband at the bedside all the time.  Patient with fair clinical improvement today.  Will start working with PT OT.  Mobilize.  Allow soft diet. Also has palliative consult.  DVT prophylaxis: SCDs Start: 10/07/20 0093   Code Status: Full code Family Communication: Husband at bedside Disposition Plan: Status is: Inpatient  Remains inpatient appropriate because:Hemodynamically unstable, Persistent severe electrolyte disturbances and Inpatient level of care appropriate due to severity of illness   Dispo: The patient is from: Home              Anticipated d/c is to: Home with home health.              Anticipated d/c date is: 2 days.  Patient currently is not medically stable to d/c.         Consultants:   Gastroenterology  Palliative  Procedures:   Upper GI endoscopy 12/11  Antimicrobials:   Rocephin,  12/11--- 12/14  Rifaximin, 12/14---   Subjective: Patient seen and examined.  Since yesterday afternoon she started waking up.  No overnight events.  Today, she is awake alert oriented x1-2.  She is composed and relaxed. Gave her juice to drink and was able to drink without problem.  She knows she is in the hospital but she does not know why she is in the hospital.  Objective: Vitals:   10/09/20 1416 10/09/20 1500 10/09/20 2100 10/10/20 0500  BP: (!) 146/69  115/79 (!) 126/47  Pulse: (!) 101  76 71  Resp:   18 16  Temp:  98.1 F (36.7 C) 99 F (37.2 C) 98.3 F (36.8 C)  TempSrc:  Oral  Oral  SpO2: 98%  100% 98%  Weight:      Height:        Intake/Output Summary (Last 24 hours) at 10/10/2020 1217 Last data filed at 10/10/2020 0700 Gross per 24 hour  Intake --  Output 800 ml  Net -800 ml   Filed Weights   10/07/20 0011 10/08/20 0429  Weight: 79.3 kg 84.9 kg    Examination:  General exam: Looks comfortable today.  She is alert and awake and oriented x1-2. Respiratory system: Clear to auscultation.  No added sounds. She has port on the right anterior chest wall. Cardiovascular system: S1 & S2 heard, no added sounds. Gastrointestinal system: Soft.  Nondistended.  No rigidity.  No palpable ascites.  No tenderness. Central nervous system: Patient is awake and alert.  Still confused, however able to follow commands and abnormal coordination.   Skin: No rashes, lesions or ulcers  Data Reviewed: I have personally reviewed following labs and imaging studies  CBC: Recent Labs  Lab 10/05/20 1202 10/07/20 0017 10/07/20 0940 10/07/20 1800 10/08/20 0527 10/08/20 1732 10/09/20 0628 10/10/20 0604  WBC 9.1 13.2*   < > 15.3* 14.2* 13.5* 11.5* 9.6  NEUTROABS 5.0 10.1*  --   --   --  8.9* 7.3 5.6  HGB 10.3* 6.5*   < > 9.8* 9.5* 9.5* 9.0* 8.7*  HCT 30.5* 19.8*   < > 28.8* 27.5* 28.0* 27.3* 27.2*  MCV 102.7* 105.9*   < > 92.6 93.5 94.3 96.8 97.8  PLT 202 212   < > 169 180 188  173 166   < > = values in this interval not displayed.   Basic Metabolic Panel: Recent Labs  Lab 10/05/20 1202 10/07/20 0017 10/07/20 0940 10/08/20 0527 10/09/20 0628 10/10/20 0604  NA 135 136 138 139 139 141  K 3.4* 3.9 4.2 4.0 3.3* 3.3*  CL 105 104 109 110 111 113*  CO2 20* 17* 20* 18* 19* 21*  GLUCOSE 104* 194* 170* 192* 149* 122*  BUN 21 66* 81* 93* 72* 50*  CREATININE 1.18* 1.47* 1.40* 1.83* 1.83* 1.51*  CALCIUM 8.7* 8.4* 8.2* 8.3* 7.9* 8.0*  MG 1.7  --  2.3  --  2.1  --   PHOS  --   --   --   --  3.8  --    GFR: Estimated Creatinine Clearance: 27.2 mL/min (A) (by C-G formula based on SCr of 1.51 mg/dL (H)). Liver Function Tests: Recent Labs  Lab 10/05/20 1202 10/07/20 0017 10/07/20 0940 10/09/20 0628 10/10/20 0604  AST 46* 42* 39 46*  44*  ALT 24 23 21 23 23   ALKPHOS 155* 125 104 72 77  BILITOT 1.4* 0.8 1.4* 1.4* 1.5*  PROT 6.1* 5.3* 4.7* 4.7* 4.8*  ALBUMIN 2.8* 2.4* 2.3* 2.3* 2.3*   No results for input(s): LIPASE, AMYLASE in the last 168 hours. Recent Labs  Lab 10/07/20 1800 10/09/20 0628 10/10/20 0604  AMMONIA 73* 16 43*   Coagulation Profile: Recent Labs  Lab 10/07/20 0017 10/09/20 0628 10/10/20 0604  INR 1.3* 1.3* 1.2   Cardiac Enzymes: No results for input(s): CKTOTAL, CKMB, CKMBINDEX, TROPONINI in the last 168 hours. BNP (last 3 results) No results for input(s): PROBNP in the last 8760 hours. HbA1C: No results for input(s): HGBA1C in the last 72 hours. CBG: Recent Labs  Lab 10/09/20 2024 10/10/20 0102 10/10/20 0429 10/10/20 0738 10/10/20 1212  GLUCAP 110* 119* 105* 107* 134*   Lipid Profile: No results for input(s): CHOL, HDL, LDLCALC, TRIG, CHOLHDL, LDLDIRECT in the last 72 hours. Thyroid Function Tests: No results for input(s): TSH, T4TOTAL, FREET4, T3FREE, THYROIDAB in the last 72 hours. Anemia Panel: No results for input(s): VITAMINB12, FOLATE, FERRITIN, TIBC, IRON, RETICCTPCT in the last 72 hours. Sepsis Labs: No results  for input(s): PROCALCITON, LATICACIDVEN in the last 168 hours.  Recent Results (from the past 240 hour(s))  Resp Panel by RT-PCR (Flu A&B, Covid) Nasopharyngeal Swab     Status: None   Collection Time: 10/07/20  1:11 AM   Specimen: Nasopharyngeal Swab; Nasopharyngeal(NP) swabs in vial transport medium  Result Value Ref Range Status   SARS Coronavirus 2 by RT PCR NEGATIVE NEGATIVE Final    Comment: (NOTE) SARS-CoV-2 target nucleic acids are NOT DETECTED.  The SARS-CoV-2 RNA is generally detectable in upper respiratory specimens during the acute phase of infection. The lowest concentration of SARS-CoV-2 viral copies this assay can detect is 138 copies/mL. A negative result does not preclude SARS-Cov-2 infection and should not be used as the sole basis for treatment or other patient management decisions. A negative result may occur with  improper specimen collection/handling, submission of specimen other than nasopharyngeal swab, presence of viral mutation(s) within the areas targeted by this assay, and inadequate number of viral copies(<138 copies/mL). A negative result must be combined with clinical observations, patient history, and epidemiological information. The expected result is Negative.  Fact Sheet for Patients:  EntrepreneurPulse.com.au  Fact Sheet for Healthcare Providers:  IncredibleEmployment.be  This test is no t yet approved or cleared by the [place] FDA and  has been authorized for detection and/or diagnosis of SARS-CoV-2 by FDA under an Emergency Use Authorization (EUA). This EUA will remain  in effect (meaning this test can be used) for the duration of the COVID-19 declaration under Section 564(b)(1) of the Act, 21 U.S.C.section 360bbb-3(b)(1), unless the authorization is terminated  or revoked sooner.       Influenza A by PCR NEGATIVE NEGATIVE Final   Influenza B by PCR NEGATIVE NEGATIVE Final    Comment: (NOTE) The  Xpert Xpress SARS-CoV-2/FLU/RSV plus assay is intended as an aid in the diagnosis of influenza from Nasopharyngeal swab specimens and should not be used as a sole basis for treatment. Nasal washings and aspirates are unacceptable for Xpert Xpress SARS-CoV-2/FLU/RSV testing.  Fact Sheet for Patients: EntrepreneurPulse.com.au  Fact Sheet for Healthcare Providers: IncredibleEmployment.be  This test is not yet approved or cleared by the [place] FDA and has been authorized for detection and/or diagnosis of SARS-CoV-2 by FDA under an Emergency Use Authorization (EUA). This EUA will  remain in effect (meaning this test can be used) for the duration of the COVID-19 declaration under Section 564(b)(1) of the Act, 21 U.S.C. section 360bbb-3(b)(1), unless the authorization is terminated or revoked.  Performed at [facility], [address], [place], [place] [address]          Radiology Studies: [place] ASCITES (ABDOMEN LIMITED)  Result Date: 10/09/2020 CLINICAL DATA:  Ascites EXAM: LIMITED ABDOMEN ULTRASOUND FOR ASCITES TECHNIQUE: Limited ultrasound survey for ascites was performed in all four abdominal quadrants. COMPARISON:  CT abdomen and pelvis 08/03/2020 FINDINGS: Minimal ascites seen in the mid abdomen. No significant ascites is identified to permit paracentesis. IMPRESSION: Insufficient ascites for paracentesis. Electronically Signed   By: Lavonia Dana M.D.   On: 10/09/2020 15:23        Scheduled Meds: . Chlorhexidine Gluconate Cloth  6 each Topical Daily  . lactulose  20 g Oral TID  . pantoprazole  40 mg Intravenous Q12H  . potassium chloride  20 mEq Oral BID  . propranolol  40 mg Oral BID  . rifaximin  550 mg Oral TID   Continuous Infusions: . sodium chloride    . sodium chloride    . sodium chloride 10 mL/hr (10/07/20 1813)     LOS: 3 days    Time spent: 35 minutes    Barb Merino, MD [facility] Pager  [phone]

## 2020-10-11 ENCOUNTER — Inpatient Hospital Stay (HOSPITAL_COMMUNITY): Payer: Medicare HMO | Admitting: Anesthesiology

## 2020-10-11 ENCOUNTER — Encounter (HOSPITAL_COMMUNITY): Payer: Self-pay | Admitting: Family Medicine

## 2020-10-11 ENCOUNTER — Encounter (HOSPITAL_COMMUNITY)
Admission: EM | Disposition: A | Discharge status: Home Health | Payer: Self-pay | Source: Home / Self Care | Attending: Internal Medicine

## 2020-10-11 DIAGNOSIS — K2100 Gastro-esophageal reflux disease with esophagitis, without bleeding: Secondary | ICD-10-CM

## 2020-10-11 DIAGNOSIS — I8500 Esophageal varices without bleeding: Secondary | ICD-10-CM

## 2020-10-11 DIAGNOSIS — K222 Esophageal obstruction: Secondary | ICD-10-CM

## 2020-10-11 DIAGNOSIS — K449 Diaphragmatic hernia without obstruction or gangrene: Secondary | ICD-10-CM

## 2020-10-11 DIAGNOSIS — K922 Gastrointestinal hemorrhage, unspecified: Secondary | ICD-10-CM

## 2020-10-11 DIAGNOSIS — K3189 Other diseases of stomach and duodenum: Secondary | ICD-10-CM

## 2020-10-11 HISTORY — PX: ESOPHAGOGASTRODUODENOSCOPY (EGD) WITH PROPOFOL: SHX5813

## 2020-10-11 LAB — GLUCOSE, CAPILLARY
Glucose-Capillary: 101 mg/dL — ABNORMAL HIGH (ref 70–99)
Glucose-Capillary: 110 mg/dL — ABNORMAL HIGH (ref 70–99)
Glucose-Capillary: 110 mg/dL — ABNORMAL HIGH (ref 70–99)
Glucose-Capillary: 77 mg/dL (ref 70–99)
Glucose-Capillary: 97 mg/dL (ref 70–99)
Glucose-Capillary: 98 mg/dL (ref 70–99)
Glucose-Capillary: 99 mg/dL (ref 70–99)

## 2020-10-11 LAB — TYPE AND SCREEN
ABO/RH(D): O POS
Antibody Screen: NEGATIVE
Unit division: 0
Unit division: 0
Unit division: 0
Unit division: 0

## 2020-10-11 LAB — CBC WITH DIFFERENTIAL/PLATELET
Abs Immature Granulocytes: 0.23 10*3/uL — ABNORMAL HIGH (ref 0.00–0.07)
Basophils Absolute: 0.1 10*3/uL (ref 0.0–0.1)
Basophils Relative: 0 %
Eosinophils Absolute: 1 10*3/uL — ABNORMAL HIGH (ref 0.0–0.5)
Eosinophils Relative: 9 %
HCT: 27.6 % — ABNORMAL LOW (ref 36.0–46.0)
Hemoglobin: 8.9 g/dL — ABNORMAL LOW (ref 12.0–15.0)
Immature Granulocytes: 2 %
Lymphocytes Relative: 23 %
Lymphs Abs: 2.6 10*3/uL (ref 0.7–4.0)
MCH: 32.4 pg (ref 26.0–34.0)
MCHC: 32.2 g/dL (ref 30.0–36.0)
MCV: 100.4 fL — ABNORMAL HIGH (ref 80.0–100.0)
Monocytes Absolute: 1.3 10*3/uL — ABNORMAL HIGH (ref 0.1–1.0)
Monocytes Relative: 12 %
Neutro Abs: 6 10*3/uL (ref 1.7–7.7)
Neutrophils Relative %: 54 %
Platelets: 175 10*3/uL (ref 150–400)
RBC: 2.75 MIL/uL — ABNORMAL LOW (ref 3.87–5.11)
RDW: 18 % — ABNORMAL HIGH (ref 11.5–15.5)
WBC: 11.2 10*3/uL — ABNORMAL HIGH (ref 4.0–10.5)
nRBC: 0.3 % — ABNORMAL HIGH (ref 0.0–0.2)

## 2020-10-11 LAB — COMPREHENSIVE METABOLIC PANEL
ALT: 23 U/L (ref 0–44)
AST: 43 U/L — ABNORMAL HIGH (ref 15–41)
Albumin: 2.3 g/dL — ABNORMAL LOW (ref 3.5–5.0)
Alkaline Phosphatase: 76 U/L (ref 38–126)
Anion gap: 7 (ref 5–15)
BUN: 37 mg/dL — ABNORMAL HIGH (ref 8–23)
CO2: 20 mmol/L — ABNORMAL LOW (ref 22–32)
Calcium: 7.7 mg/dL — ABNORMAL LOW (ref 8.9–10.3)
Chloride: 111 mmol/L (ref 98–111)
Creatinine, Ser: 1.52 mg/dL — ABNORMAL HIGH (ref 0.44–1.00)
GFR, Estimated: 33 mL/min — ABNORMAL LOW (ref >60)
Glucose, Bld: 112 mg/dL — ABNORMAL HIGH (ref 70–99)
Potassium: 3.8 mmol/L (ref 3.5–5.1)
Sodium: 138 mmol/L (ref 135–145)
Total Bilirubin: 1.4 mg/dL — ABNORMAL HIGH (ref 0.3–1.2)
Total Protein: 4.7 g/dL — ABNORMAL LOW (ref 6.5–8.1)

## 2020-10-11 LAB — BPAM RBC
Blood Product Expiration Date: 202112282359
Blood Product Expiration Date: 202201162359
Blood Product Expiration Date: 202201162359
Blood Product Expiration Date: 202201192359
ISSUE DATE / TIME: 202112110146
ISSUE DATE / TIME: 202112110255
ISSUE DATE / TIME: 202112110506
Unit Type and Rh: 5100
Unit Type and Rh: 5100
Unit Type and Rh: 5100
Unit Type and Rh: 5100

## 2020-10-11 LAB — PROTIME-INR
INR: 1.3 — ABNORMAL HIGH (ref 0.8–1.2)
Prothrombin Time: 15.2 seconds (ref 11.4–15.2)

## 2020-10-11 SURGERY — ESOPHAGOGASTRODUODENOSCOPY (EGD) WITH PROPOFOL
Anesthesia: General

## 2020-10-11 MED ORDER — PROPOFOL 10 MG/ML IV BOLUS
INTRAVENOUS | Status: DC | PRN
  Administered 2020-10-11: 20 mg via INTRAVENOUS
  Administered 2020-10-11 (×2): 50 mg via INTRAVENOUS

## 2020-10-11 MED ORDER — SODIUM CHLORIDE 0.9 % IV SOLN: INTRAVENOUS | Status: DC

## 2020-10-11 MED ORDER — LIDOCAINE HCL (CARDIAC) PF 50 MG/5ML IV SOSY
PREFILLED_SYRINGE | INTRAVENOUS | Status: DC | PRN
  Administered 2020-10-11: 60 mg via INTRAVENOUS

## 2020-10-11 MED ORDER — PANTOPRAZOLE SODIUM 40 MG PO TBEC
40.0000 mg | DELAYED_RELEASE_TABLET | Freq: Two times a day (BID) | ORAL | Status: DC
  Administered 2020-10-12 – 2020-10-13 (×3): 40 mg via ORAL
  Filled 2020-10-11 (×4): qty 1

## 2020-10-11 MED ORDER — LACTATED RINGERS IV SOLN: INTRAVENOUS | Status: DC

## 2020-10-11 MED ORDER — STERILE WATER FOR IRRIGATION IR SOLN
Status: DC | PRN
  Administered 2020-10-11: 1.5 mL

## 2020-10-11 NOTE — Op Note (Signed)
[facility] Patient Name: [name] Procedure Date: 10/11/2020 3:53 PM MRN: [id number] Date of Birth: 1930-06-16 Attending MD: Hildred Laser , MD CSN: [id number] Age: 86 Admit Type: Inpatient Procedure:                Upper GI endoscopy Indications:              Hematemesis, Melena Providers:                Hildred Laser, MD, Caprice Kluver, Crystal Page Referring MD:             Nada Libman, MD Medicines:                Propofol per Anesthesia Complications:            No immediate complications. Estimated Blood Loss:     Estimated blood loss: none. Procedure:                Pre-Anesthesia Assessment:                           - Prior to the procedure, a History and Physical                            was performed, and patient medications and                            allergies were reviewed. The patient's tolerance of                            previous anesthesia was also reviewed. The risks                            and benefits of the procedure and the sedation                            options and risks were discussed with the patient.                            All questions were answered, and informed consent                            was obtained. Prior Anticoagulants: The patient has                            taken no previous anticoagulant or antiplatelet                            agents. ASA Grade Assessment: III - A patient with                            severe systemic disease. After reviewing the risks                            and benefits, the patient was deemed in  satisfactory condition to undergo the procedure.                           After obtaining informed consent, the endoscope was                            passed under direct vision. Throughout the                            procedure, the patient's blood pressure, pulse, and                            oxygen saturations were monitored continuously. The                             [id number]-[id number] ([id number]) scope was introduced through the                            mouth, and advanced to the second part of duodenum.                            The upper GI endoscopy was accomplished without                            difficulty. The patient tolerated the procedure                            well. Scope In: 4:12:39 PM Scope Out: 4:21:09 PM Total Procedure Duration: 0 hours 8 minutes 30 seconds  Findings:      The hypopharynx was normal.      The proximal esophagus and mid esophagus were normal.      Grade I varices were found in the distal esophagus.      LA Grade B (one or more mucosal breaks more than 5 mm, not extending       between tops of 2 mucosal folds) esophagitis was found 31 to 34 cm from       the incisors.      One benign-appearing, intrinsic mild stenosis was found 34 cm from the       incisors. The stenosis was traversed.      A 4 cm hiatal hernia was present.      A single erosion with no stigmata of recent bleeding was found in the       gastric antrum and in the prepyloric region of the stomach.      The exam of the stomach was otherwise normal.      The duodenal bulb and second portion of the duodenum were normal. Impression:               - Normal hypopharynx.                           - Normal proximal esophagus and mid esophagus.                           - Grade I esophageal varices.                           -  LA Grade B reflux esophagitis.                           - Benign-appearing esophageal stenosis.                           - 4 cm hiatal hernia.                           - Erosive gastropathy with no stigmata of recent                            bleeding.                           - Normal duodenal bulb and second portion of the                            duodenum.                           - No specimens collected. Moderate Sedation:      Per Anesthesia Care Recommendation:           - Patient has a contact number available  for                            emergencies. The signs and symptoms of potential                            delayed complications were discussed with the                            patient. Return to normal activities tomorrow.                            Written discharge instructions were provided to the                            patient.                           - Low sodium diet today.                           - Continue present medications.                           - Decrease octreotide dose to 25 mcg/h and                            discontinue in 4 hours.                           - H. pylori serology.                           - AMA, hepatitis B surface  antigen, HCV antibody, Procedure Code(s):        --- Professional ---                           [id number], Esophagogastroduodenoscopy, flexible,                            transoral; diagnostic, including collection of                            specimen(s) by brushing or washing, when performed                            (separate procedure) Diagnosis Code(s):        --- Professional ---                           I85.00, Esophageal varices without bleeding                           K21.00, Gastro-esophageal reflux disease with                            esophagitis, without bleeding                           K22.2, Esophageal obstruction                           K44.9, Diaphragmatic hernia without obstruction or                            gangrene                           K31.89, Other diseases of stomach and duodenum                           K92.0, Hematemesis                           K92.1, Melena (includes Hematochezia) CPT copyright 2019 American Medical Association. All rights reserved. The codes documented in this report are preliminary and upon coder review may  be revised to meet current compliance requirements. Hildred Laser, MD Hildred Laser, MD 10/11/2020 4:34:31 PM This report has been signed  electronically. Number of Addenda: 0

## 2020-10-11 NOTE — Evaluation (Signed)
Physical Therapy Evaluation Patient Details Name: [name] MRN: [id number] DOB: 04-08-1930 Today's Date: 10/11/2020   History of Present Illness  [name]  is a 86 y.o. female, with history of thyroid nodule, pneumonia, hypertension, hyperlipidemia, GERD, breast cancer presents to the ER with a chief complaint of generalized weakness and near syncopal episode.  Most of history is obtained from husband at bedside.  It is difficult to distinguish between patient's inability to hear questions, versus her comprehension of the same questions.  In the room during the exam patient was putting her hand into her diaper scooping up the melena and putting it to her face, which indicates cognitive problems to me, but husband reports that she is normally alert and oriented x4, and attends to all her own ADLs.  Husband reports that patient had [place] around 12 PM.  They did not realize that that was indicative of GI bleed, and patient carried on normally with her day.  She had a total of 4 melanotic stools at home, and then started to have generalized weakness.  She called her husband that she was not able to move her legs and for him to come help.  He was not able to move her so they lowered her down to the floor.  He took some time to clean her up after that, and then they called EMS to come into the hospital.  He reports that she has been fatigued throughout the day, but she has had normal appetite she has not been nauseous or vomiting, and has not reported any kind of aches or pains.  He reports she was in her normal state of health up until today.  She is undergoing chemo with Dr. Raliegh Ip.  Unfortunately further history cannot be obtained as patient is not answering questions.    Clinical Impression  Patient demonstrates slow labored movement for sitting up at bedside having to use bed rail to pull self to sitting with head of bed flat, very unsteady on feet requiring Min hand held assist to maintain standing  balance, able to ambulate in room/hallway using RW with slow labored cadence and limited mostly due to fatigue.  Patient tolerated sitting up in chair after therapy with her daughter present in room after therapy.  Patient will benefit from continued physical therapy in hospital and recommended venue below to increase strength, balance, endurance for safe ADLs and gait.      Follow Up Recommendations SNF;Supervision for mobility/OOB;Supervision - Intermittent    Equipment Recommendations  None recommended by PT    Recommendations for Other Services       Precautions / Restrictions Precautions Precautions: Fall Restrictions Weight Bearing Restrictions: No      Mobility  Bed Mobility Overal bed mobility: Needs Assistance Bed Mobility: Supine to Sit     Supine to sit: Min assist;Mod assist     General bed mobility comments: slow labored movement, required use of bed rail with head of bed flat    Transfers Overall transfer level: Needs assistance Equipment used: None;Rolling walker (2 wheeled) Transfers: Sit to/from [facility] Sit to Stand: Min assist Stand pivot transfers: Min assist       General transfer comment: very unsteady on feet, required the use of RW for safety  Ambulation/Gait Ambulation/Gait assistance: Min assist Gait Distance (Feet): 35 Feet Assistive device: Rolling walker (2 wheeled);1 person hand held assist Gait Pattern/deviations: Decreased step length - right;Decreased step length - left;Decreased stride length Gait velocity: decreased   General  Gait Details: slow labored unsteady cadence, increased time to make turns, no loss of balance, limited secondary to fatigue  Stairs            Wheelchair Mobility    Modified Rankin (Stroke Patients Only)       Balance Overall balance assessment: Needs assistance Sitting-balance support: Feet supported Sitting balance-Leahy Scale: Fair Sitting balance - Comments: fair/good  seated at EOB   Standing balance support: During functional activity;Bilateral upper extremity supported Standing balance-Leahy Scale: Poor Standing balance comment: fair/poor using RW                             Pertinent Vitals/Pain Pain Assessment: No/denies pain    Home Living Family/patient expects to be discharged to:: Private residence Living Arrangements: Spouse/significant other Available Help at Discharge: Family;Available 24 hours/day Type of Home: House Home Access: Stairs to enter Entrance Stairs-Rails: Right;Left;Can reach both Entrance Stairs-Number of Steps: 4 Home Layout: One level Home Equipment: Toilet riser;Walker - 2 wheels;Cane - single point;Grab bars - tub/shower      Prior Function Level of Independence: Needs assistance   Gait / Transfers Assistance Needed: household ambulator leaning on nearby objects for support, hand held assistance with longer distances  ADL's / Homemaking Assistance Needed: assisted by family        Hand Dominance        Extremity/Trunk Assessment   Upper Extremity Assessment Upper Extremity Assessment: Generalized weakness    Lower Extremity Assessment Lower Extremity Assessment: Generalized weakness    Cervical / Trunk Assessment Cervical / Trunk Assessment: Normal  Communication   Communication: HOH  Cognition Arousal/Alertness: Awake/alert Behavior During Therapy: WFL for tasks assessed/performed Overall Cognitive Status: Within Functional Limits for tasks assessed                                        General Comments      Exercises     Assessment/Plan    PT Assessment Patient needs continued PT services  PT Problem List Decreased strength;Decreased activity tolerance;Decreased balance;Decreased mobility       PT Treatment Interventions DME instruction;Gait training;Balance training;Stair training;Functional mobility training;Therapeutic activities;Therapeutic  exercise;Patient/family education    PT Goals (Current goals can be found in the Care Plan section)  Acute Rehab PT Goals Patient Stated Goal: return home with family to assist PT Goal Formulation: With patient/family Time For Goal Achievement: 10/25/20 Potential to Achieve Goals: Good    Frequency Min 3X/week   Barriers to discharge        Co-evaluation               AM-PAC PT "6 Clicks" Mobility  Outcome Measure Help needed turning from your back to your side while in a flat bed without using bedrails?: A Little Help needed moving from lying on your back to sitting on the side of a flat bed without using bedrails?: A Lot Help needed moving to and from a bed to a chair (including a wheelchair)?: A Little Help needed standing up from a chair using your arms (e.g., wheelchair or bedside chair)?: A Little Help needed to walk in hospital room?: A Lot Help needed climbing 3-5 steps with a railing? : A Lot 6 Click Score: 15    End of Session   Activity Tolerance: Patient tolerated treatment well;Patient limited by fatigue Patient left: in  chair;with call bell/phone within reach;with chair alarm set;with family/visitor present Nurse Communication: Mobility status PT Visit Diagnosis: Unsteadiness on feet (R26.81);Other abnormalities of gait and mobility (R26.89);Muscle weakness (generalized) (M62.81)    Time: 7158-0638 PT Time Calculation (min) (ACUTE ONLY): 33 min   Charges:   PT Evaluation $PT Eval Moderate Complexity: 1 Mod PT Treatments $Therapeutic Activity: 23-37 mins        2:11 PM, 10/11/20 Lonell Grandchild, MPT Physical Therapist with [facility] [phone] office [phone] mobile phone

## 2020-10-11 NOTE — Progress Notes (Signed)
Octreotide dose was reduced early this afternoon. Octreotide will be DC'd this evening. Transition to oral pantoprazole starting tomorrow morning at 40 mg p.o. twice daily.

## 2020-10-11 NOTE — Anesthesia Procedure Notes (Signed)
Date/Time: 10/11/2020 4:06 PM Performed by: Vista Deck, CRNA Pre-anesthesia Checklist: Patient identified, Emergency Drugs available, Suction available, Timeout performed and Patient being monitored Patient Re-evaluated:Patient Re-evaluated prior to induction Oxygen Delivery Method: Nasal Cannula

## 2020-10-11 NOTE — Progress Notes (Signed)
Subjective:  Patient says she feels much better.  She was able to walk some with physical therapy.  She denies nausea vomiting chest pain heartburn or abdominal pain.  She did have a bowel movement this morning.  She is not sure whether was black or brown.  She did not see bright red blood.  She is hungry.  Current Medications:  Current Facility-Administered Medications:  .  0.9 %  sodium chloride infusion, 10 mL/hr, Intravenous, Once, Rourk, Cristopher Estimable, MD .  0.9 %  sodium chloride infusion, 10 mL/hr, Intravenous, Once, Rourk, Cristopher Estimable, MD .  0.9 %  sodium chloride infusion, 10 mL/hr, Intravenous, Once, Rourk, Cristopher Estimable, MD, Last Rate: 10 mL/hr at 10/07/20 1813, 10 mL/hr at 10/07/20 1813 .  acetaminophen (TYLENOL) tablet 650 mg, 650 mg, Oral, Q6H PRN **OR** acetaminophen (TYLENOL) suppository 650 mg, 650 mg, Rectal, Q6H PRN, Rourk, Cristopher Estimable, MD .  cefTRIAXone (ROCEPHIN) 1 g in sodium chloride 0.9 % 100 mL IVPB, 1 g, Intravenous, Q24H, Harper, Kristen S, PA-C, Last Rate: 200 mL/hr at 10/10/20 1823, 1 g at 10/10/20 1823 .  Chlorhexidine Gluconate Cloth 2 % PADS 6 each, 6 each, Topical, Daily, Barb Merino, MD, 6 each at 10/10/20 1100 .  lactulose (CHRONULAC) 10 GM/15ML solution 20 g, 20 g, Oral, TID, Barb Merino, MD, 20 g at 10/10/20 2235 .  nadolol (CORGARD) tablet 20 mg, 20 mg, Oral, Daily, Montez Morita, Daniel, MD .  Margrett Rud octreotide (SANDOSTATIN) 2 mcg/mL load via infusion 50 mcg, 50 mcg, Intravenous, Once, 50 mcg at 10/10/20 1620 **AND** octreotide (SANDOSTATIN) 500 mcg in sodium chloride 0.9 % 250 mL (2 mcg/mL) infusion, 50 mcg/hr, Intravenous, Continuous, Montez Morita, Quillian Quince, MD, Last Rate: 25 mL/hr at 10/10/20 1612, 50 mcg/hr at 10/10/20 1612 .  ondansetron (ZOFRAN) tablet 4 mg, 4 mg, Oral, Q6H PRN **OR** ondansetron (ZOFRAN) injection 4 mg, 4 mg, Intravenous, Q6H PRN, Rourk, Cristopher Estimable, MD .  pantoprazole (PROTONIX) injection 40 mg, 40 mg, Intravenous, Q12H, Rourk, Cristopher Estimable, MD, 40 mg at 10/10/20 2235 .  potassium chloride SA (KLOR-CON) CR tablet 20 mEq, 20 mEq, Oral, BID, Barb Merino, MD, 20 mEq at 10/10/20 2235 .  rifaximin (XIFAXAN) tablet 550 mg, 550 mg, Oral, TID, Barb Merino, MD, 550 mg at 10/10/20 2234  Facility-Administered Medications Ordered in Other Encounters:  .  sodium chloride flush (NS) 0.9 % injection 10 mL, 10 mL, Intravenous, PRN, Derek Jack, MD, 10 mL at 05/23/20 1025 .  sodium chloride flush (NS) 0.9 % injection 10 mL, 10 mL, Intravenous, PRN, Derek Jack, MD, 10 mL at 05/23/20 0900   Objective: Blood pressure (!) 131/57, pulse 62, temperature 98 F (36.7 C), resp. rate 18, height _0  (1.626 m), weight 85.7 kg, SpO2 98 %. Patient is alert and in no acute distress. She appears to be comfortable sitting in a recliner chair. He does not have asterixis JVD is not elevated. She has PowerPort in the right pectoral region. Cardiac exam with regular rhythm normal S1 and S2.  She has grade 2/6 systolic ejection murmur best heard at aortic area. Auscultation lungs reveal vesicular breath sounds bilaterally. Abdomen is full but soft and nontender with organomegaly or masses No peripheral edema noted.    Labs/studies Results:  CBC Latest Ref Rng & Units 10/11/2020 10/10/2020 10/09/2020  WBC 4.0 - 10.5 K/uL 11.2(H) 9.6 11.5(H)  Hemoglobin 12.0 - 15.0 g/dL 8.9(L) 8.7(L) 9.0(L)  Hematocrit 36.0 - 46.0 % 27.6(L) 27.2(L) 27.3(L)  Platelets 150 - 400  K/uL 175 166 173    CMP Latest Ref Rng & Units 10/11/2020 10/10/2020 10/09/2020  Glucose 70 - 99 mg/dL 112(H) 122(H) 149(H)  BUN 8 - 23 mg/dL 37(H) 50(H) 72(H)  Creatinine 0.44 - 1.00 mg/dL 1.52(H) 1.51(H) 1.83(H)  Sodium 135 - 145 mmol/L 138 141 139  Potassium 3.5 - 5.1 mmol/L 3.8 3.3(L) 3.3(L)  Chloride 98 - 111 mmol/L 111 113(H) 111  CO2 22 - 32 mmol/L 20(L) 21(L) 19(L)  Calcium 8.9 - 10.3 mg/dL 7.7(L) 8.0(L) 7.9(L)  Total Protein 6.5 - 8.1 g/dL 4.7(L) 4.8(L)  4.7(L)  Total Bilirubin 0.3 - 1.2 mg/dL 1.4(H) 1.5(H) 1.4(H)  Alkaline Phos 38 - 126 U/L 76 77 72  AST 15 - 41 U/L 43(H) 44(H) 46(H)  ALT 0 - 44 U/L _0 Hepatic Function Latest Ref Rng & Units 10/11/2020 10/10/2020 10/09/2020  Total Protein 6.5 - 8.1 g/dL 4.7(L) 4.8(L) 4.7(L)  Albumin 3.5 - 5.0 g/dL 2.3(L) 2.3(L) 2.3(L)  AST 15 - 41 U/L 43(H) 44(H) 46(H)  ALT 0 - 44 U/L _1 Alk Phosphatase 38 - 126 U/L 76 77 72  Total Bilirubin 0.3 - 1.2 mg/dL 1.4(H) 1.5(H) 1.4(H)  Bilirubin, Direct 0.0 - 0.2 mg/dL - - -    INR 1.3  Abdominal pelvic CT from 04/26/2020 reviewed.  Liver contour changes suggestive of cirrhosis. EGD report from 10/07/2020 reviewed  Assessment:  #1.  Upper GI bleed.  Patient has received 3 units of PRBCs.  EGD 4 days ago revealed esophageal varices esophagitis hiatal hernia and erosive antral gastritis.  Examination was incomplete because of presence of large amount of coffee-ground material. Patient is on PPI octreotide and nadolol #2.  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  No prior history.  Consider limited work-up.  #3..  Breast carcinoma.  Patient is receiving chemotherapy.  #4.  Aortic stenosis   Recommendations  Proceed with esophagogastroduodenoscopy later today. Since condition discussed with her daughter who is at bedside.

## 2020-10-11 NOTE — Anesthesia Postprocedure Evaluation (Signed)
Anesthesia Post Note  Patient: [name]  Procedure(s) Performed: ESOPHAGOGASTRODUODENOSCOPY (EGD) WITH PROPOFOL (N/A )  Patient location during evaluation: PACU Anesthesia Type: General Level of consciousness: awake and alert and patient cooperative Pain management: satisfactory to patient Vital Signs Assessment: post-procedure vital signs reviewed and stable Respiratory status: spontaneous breathing Cardiovascular status: stable Postop Assessment: no apparent nausea or vomiting Anesthetic complications: no   No complications documented.   Last Vitals:  Vitals:   10/11/20 1446 10/11/20 1626  BP: (!) 160/75 (!) 118/59  Pulse:    Resp:  15  Temp:    SpO2: 97% 95%    Last Pain:  Vitals:   10/11/20 1610  TempSrc:   PainSc: 0-No pain                 Denys Salinger

## 2020-10-11 NOTE — Transfer of Care (Signed)
Immediate Anesthesia Transfer of Care Note  Patient: [name]  Procedure(s) Performed: ESOPHAGOGASTRODUODENOSCOPY (EGD) WITH PROPOFOL (N/A )  Patient Location: PACU  Anesthesia Type:General  Level of Consciousness: awake, alert  and patient cooperative  Airway & Oxygen Therapy: Patient Spontanous Breathing and Patient connected to nasal cannula oxygen  Post-op Assessment: Report given to RN and Post -op Vital signs reviewed and stable  Post vital signs: Reviewed and stable  Last Vitals:  Vitals Value Taken Time  BP 118/59 10/11/20 1626  Temp    Pulse    Resp 16 10/11/20 1627  SpO2    Vitals shown include unvalidated device data. SEE PACU FLOW SHEET  Last Pain:  Vitals:   10/11/20 1610  TempSrc:   PainSc: 0-No pain         Complications: No complications documented.

## 2020-10-11 NOTE — Progress Notes (Signed)
Has used bedpan and assisted to bedside commode and has voided both times but has had no bm today.  Remains npo for endo this afternoon.  Daughter at bedside.

## 2020-10-11 NOTE — Progress Notes (Signed)
Ate all of supper tray with no c/o nausea or pain.  Daughter at bedside.  No stools today

## 2020-10-11 NOTE — Progress Notes (Signed)
PROGRESS NOTE   [name]  VZD:[id number] DOB: February 02, 1930 DOA: 10/07/2020 PCP: [name], MD  Brief Narrative:  86 year old WF Metastatic right breast cancer followed by Dr. [name] status post chemo currently on Enhertu every 3 weekly Cirrhosis/ascites cryptogenic cause HTN HLD  Admit 10/07/2020 from home with frank melena multiple stools On admission hemoglobin 6.5 T-max 94-had AKI creatinine up from 1.1-1.4 Transfused 3 units on admission-started on Protonix gtt.-started on octreotide gtt. in addition EGD performed 12/11 was incomplete  Assessment & Plan:   Principal Problem:   GI bleed Active Problems:   Symptomatic anemia   AKI (acute kidney injury) ([place])   Near syncope   Moderate protein-calorie malnutrition (HCC)   Leukocytosis   Acute hepatic encephalopathy   Aortic valvar stenosis   1. Probable upper GI bleed from gastric varices a. Endoscopy 12/11 showed varices b. Repeat endoscopy scheduled for 12/15 c. Likely will need banding and is already on nadolol 20-continue Sandostatin as per GI as well as IV Protonix 40 twice daily d. Continue ceftriaxone and rifaximin --switch ceftriaxone to Vantin on discharge 2. 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 Much improved continue lactulose 20 3 times daily may be able to scale back to 20 twice daily in the next day or so b. Daughter confirms improvement c. Above meds as noted d. Holding Lasix 20 at this time 3. Metastatic right breast cancer on chemo a. Currently on Enhertu which does not cause cirrhosis but can cause an increase in transaminases b. Defer to Dr. [name] but would hold this medication ongoing for now 4. Severe aortic stenosis a. Some consideration for outpatient follow-up echo and work-up? b. May need Lasix added back 5. AKI on admission a. Quite azotemic on admission now much better closer to baseline 6. Hypokalemia on admission a. Improved  DVT prophylaxis:  SCD Code Status: DNR Family Communication: Discussed with daughter at bedside Disposition:  Status is: Inpatient  Remains inpatient appropriate because:Persistent severe electrolyte disturbances, Altered mental status and Ongoing diagnostic testing needed not appropriate for outpatient work up   Dispo: The patient is from: Home              Anticipated d/c is to: Home              Anticipated d/c date is: 1 day              Patient currently is not medically stable to d/c.       Consultants:   GI  Procedures:   Upper endoscopy 12/11 Impression:               -Esophageal varices as described without bleeding                            stigmata. Distal esophageal erosions/excoriations                            overlying varices (opposite walls). More consistent                            with trauma/reflux than variceal bleeding stigmata.                           -Stomach incompletely seen as described above due  to presence of old blood.. First and second portion                            of the duodenum appeared normal.                           -No actively bleeding lesions seen anywhere in the                            upper GI tract. Cannot rule out an occult lesion in                            the proximal stomach.  Antimicrobials: Rifaximin ceftriaxone   Subjective: Awake coherent no distress EOMI NCAT moving 4 limbs equally looks well Coherent able to tell me date time place  Objective: Vitals:   10/10/20 1227 10/10/20 2012 10/11/20 0401 10/11/20 0441  BP: (!) 146/62 (!) 116/57 (!) 131/57   Pulse: 70 (!) 51 62   Resp:  18 18   Temp: 97.9 F (36.6 C) 97.6 F (36.4 C) 98 F (36.7 C)   TempSrc: Oral Oral    SpO2: 99% 100% 98%   Weight:    85.7 kg  Height:        Intake/Output Summary (Last 24 hours) at 10/11/2020 0725 Last data filed at 10/11/2020 0400 Gross per 24 hour  Intake 338.47 ml  Output --  Net 338.47 ml    Filed Weights   10/07/20 0011 10/08/20 0429 10/11/20 0441  Weight: 79.3 kg 84.9 kg 85.7 kg    Examination: Awake pleasant Chest clear no rales no rhonchi V6-H2 holosystolic murmur Cannot appreciate hepatosplenomegaly Trace lower extremity edema No flapper tremor Power 5/5 Reflexes intact   Data Reviewed: I have personally reviewed following labs and imaging studies Hemoglobin 8.9 WBC up from 9.6-11.2 BUNs/creatinine 93/1.8-->37/1.5  Radiology Studies: [place] ASCITES (ABDOMEN LIMITED)  Result Date: 10/09/2020 CLINICAL DATA:  Ascites EXAM: LIMITED ABDOMEN ULTRASOUND FOR ASCITES TECHNIQUE: Limited ultrasound survey for ascites was performed in all four abdominal quadrants. COMPARISON:  CT abdomen and pelvis 08/03/2020 FINDINGS: Minimal ascites seen in the mid abdomen. No significant ascites is identified to permit paracentesis. IMPRESSION: Insufficient ascites for paracentesis. Electronically Signed   By: Lavonia Dana M.D.   On: 10/09/2020 15:23     Scheduled Meds: . Chlorhexidine Gluconate Cloth  6 each Topical Daily  . lactulose  20 g Oral TID  . nadolol  20 mg Oral Daily  . pantoprazole  40 mg Intravenous Q12H  . potassium chloride  20 mEq Oral BID  . rifaximin  550 mg Oral TID   Continuous Infusions: . sodium chloride    . sodium chloride    . sodium chloride 10 mL/hr (10/07/20 1813)  . cefTRIAXone (ROCEPHIN)  IV 1 g (10/10/20 1823)  . octreotide  (SANDOSTATIN)    IV infusion 50 mcg/hr (10/10/20 1612)     LOS: 4 days    Time spent: [place], MD [facility] To contact the attending provider between 7A-7P or the covering provider during after hours 7P-7A, please log into the web site [email or web address] and access using universal [place] password for that web site. If you do not have the password, please call the hospital operator.  10/11/2020, 7:25 AM

## 2020-10-11 NOTE — Anesthesia Preprocedure Evaluation (Signed)
Anesthesia Evaluation  Patient identified by MRN, date of birth, ID band Patient confused  General Assessment Comment:Very drowsy, opening eyes   Reviewed: Allergy & Precautions, NPO status , Patient's Chart, lab work & pertinent test results, reviewed documented beta blocker date and time   Airway Mallampati: II  TM Distance: >3 FB Neck ROM: Full    Dental  (+) Dental Advisory Given, Caps   Pulmonary pneumonia, resolved,    Pulmonary exam normal breath sounds clear to auscultation       Cardiovascular Exercise Tolerance: Poor hypertension, Pt. on medications + Past MI (High troponins)  + Valvular Problems/Murmurs (severe aortic stenosis) AS  Rhythm:Regular Rate:Bradycardia  1. Left ventricular ejection fraction, by estimation, is 65 to 70%. The  left ventricle has normal function. The left ventricle has no regional  wall motion abnormalities. There is mild left ventricular hypertrophy.  Left ventricular diastolic parameters  are consistent with Grade I diastolic dysfunction (impaired relaxation).  2. Right ventricular systolic function is normal. The right ventricular  size is normal. There is normal pulmonary artery systolic pressure. The  estimated right ventricular systolic pressure is 48.8 mmHg.  3. Left atrial size was mildly dilated.  4. The mitral valve is grossly normal. Mild mitral valve regurgitation.  5. The aortic valve is tricuspid, moderately calcified with decreased  cusp excursion. Aortic valve regurgitation is trivial. Severe aortic valve  stenosis. Aortic valve area, by VTI measures 0.66 cm. Aortic valve mean  gradient measures 44.0 mmHg. Aortic  valve Vmax measures 4.13 m/s. Dimentionless index 0.26.  6. The inferior vena cava is normal in size with greater than 50%  respiratory variability, suggesting right atrial pressure of 3 mmHg.   Neuro/Psych  Neuromuscular disease    GI/Hepatic Neg liver  ROS, GERD  Medicated,Upper GI bleeding   Endo/Other    Renal/GU Renal disease (AKI)     Musculoskeletal  (+) Arthritis ,   Abdominal   Peds  Hematology  (+) Blood dyscrasia, anemia ,   Anesthesia Other Findings   Reproductive/Obstetrics                             Anesthesia Physical  Anesthesia Plan  ASA: IV and emergent  Anesthesia Plan: General   Post-op Pain Management:    Induction: Intravenous  PONV Risk Score and Plan: TIVA  Airway Management Planned: Nasal Cannula, Natural Airway and Simple Face Mask  Additional Equipment:   Intra-op Plan:   Post-operative Plan: Possible Post-op intubation/ventilation  Informed Consent: I have reviewed the patients History and Physical, chart, labs and discussed the procedure including the risks, benefits and alternatives for the proposed anesthesia with the patient or authorized representative who has indicated his/her understanding and acceptance.     Dental advisory given and Consent reviewed with POA  Plan Discussed with: Surgeon  Anesthesia Plan Comments: (There is a very high risk of cardiac complications.  This patient is only an anesthesia candidate because the procedure is emergent, life-saving.)        Anesthesia Quick Evaluation

## 2020-10-11 NOTE — Progress Notes (Signed)
Brief EGD note.  Normal mucosa of proximal and esophagus Small grade 1 esophageal varices without stigmata of bleed. Erosions involving the distal 3 cm of esophagus Noncritical stricture at GE junction which was not manipulated.  GE J at 34 cm from the incisors. 1 cm sized sliding hiatal hernia. Gastritis with single prepyloric erosion without stigmata of bleed. Normal bulbar and post bulbar mucosa.

## 2020-10-11 NOTE — Plan of Care (Signed)
  Problem: Acute Rehab PT Goals(only PT should resolve) Goal: Pt Will Go Supine/Side To Sit Outcome: Progressing Flowsheets (Taken 10/11/2020 1415) Pt will go Supine/Side to Sit:  with min guard assist  with minimal assist Goal: Patient Will Transfer Sit To/From Stand Outcome: Progressing Flowsheets (Taken 10/11/2020 1415) Patient will transfer sit to/from stand:  with min guard assist  with minimal assist Goal: Pt Will Transfer Bed To Chair/Chair To Bed Outcome: Progressing Flowsheets (Taken 10/11/2020 1415) Pt will Transfer Bed to Chair/Chair to Bed:  min guard assist  with min assist Goal: Pt Will Ambulate Outcome: Progressing Flowsheets (Taken 10/11/2020 1415) Pt will Ambulate:  50 feet  with min guard assist  with minimal assist  with rolling walker   2:22 PM, 10/11/20 Lonell Grandchild, MPT Physical Therapist with [facility] [phone] office [phone] mobile phone

## 2020-10-12 ENCOUNTER — Encounter (HOSPITAL_COMMUNITY): Payer: Self-pay | Admitting: Internal Medicine

## 2020-10-12 ENCOUNTER — Telehealth: Payer: Self-pay | Admitting: Gastroenterology

## 2020-10-12 DIAGNOSIS — K7290 Hepatic failure, unspecified without coma: Secondary | ICD-10-CM

## 2020-10-12 DIAGNOSIS — K7460 Unspecified cirrhosis of liver: Secondary | ICD-10-CM

## 2020-10-12 LAB — CBC WITH DIFFERENTIAL/PLATELET
Abs Immature Granulocytes: 0.25 10*3/uL — ABNORMAL HIGH (ref 0.00–0.07)
Basophils Absolute: 0.1 10*3/uL (ref 0.0–0.1)
Basophils Relative: 1 %
Eosinophils Absolute: 1 10*3/uL — ABNORMAL HIGH (ref 0.0–0.5)
Eosinophils Relative: 9 %
HCT: 28.3 % — ABNORMAL LOW (ref 36.0–46.0)
Hemoglobin: 9.4 g/dL — ABNORMAL LOW (ref 12.0–15.0)
Immature Granulocytes: 2 %
Lymphocytes Relative: 19 %
Lymphs Abs: 1.9 10*3/uL (ref 0.7–4.0)
MCH: 33 pg (ref 26.0–34.0)
MCHC: 33.2 g/dL (ref 30.0–36.0)
MCV: 99.3 fL (ref 80.0–100.0)
Monocytes Absolute: 1.4 10*3/uL — ABNORMAL HIGH (ref 0.1–1.0)
Monocytes Relative: 13 %
Neutro Abs: 5.8 10*3/uL (ref 1.7–7.7)
Neutrophils Relative %: 56 %
Platelets: 187 10*3/uL (ref 150–400)
RBC: 2.85 MIL/uL — ABNORMAL LOW (ref 3.87–5.11)
RDW: 17.4 % — ABNORMAL HIGH (ref 11.5–15.5)
WBC: 10.4 10*3/uL (ref 4.0–10.5)
nRBC: 0 % (ref 0.0–0.2)

## 2020-10-12 LAB — GLUCOSE, CAPILLARY
Glucose-Capillary: 107 mg/dL — ABNORMAL HIGH (ref 70–99)
Glucose-Capillary: 135 mg/dL — ABNORMAL HIGH (ref 70–99)
Glucose-Capillary: 157 mg/dL — ABNORMAL HIGH (ref 70–99)
Glucose-Capillary: 205 mg/dL — ABNORMAL HIGH (ref 70–99)
Glucose-Capillary: 85 mg/dL (ref 70–99)
Glucose-Capillary: 94 mg/dL (ref 70–99)

## 2020-10-12 LAB — PROTIME-INR
INR: 1.2 (ref 0.8–1.2)
Prothrombin Time: 14.5 seconds (ref 11.4–15.2)

## 2020-10-12 LAB — HEPATITIS B SURFACE ANTIGEN: Hepatitis B Surface Ag: NONREACTIVE

## 2020-10-12 LAB — HEPATITIS C ANTIBODY: HCV Ab: NONREACTIVE

## 2020-10-12 MED ORDER — LACTULOSE 10 GM/15ML PO SOLN
20.0000 g | Freq: Two times a day (BID) | ORAL | Status: DC
  Administered 2020-10-12 – 2020-10-13 (×2): 20 g via ORAL
  Filled 2020-10-12 (×2): qty 30

## 2020-10-12 MED ORDER — RIFAXIMIN 550 MG PO TABS: 550.0000 mg | ORAL_TABLET | Freq: Two times a day (BID) | ORAL | 11 refills | Status: DC

## 2020-10-12 MED ORDER — CARBAMIDE PEROXIDE 6.5 % OT SOLN: 5.0000 [drp] | Freq: Two times a day (BID) | OTIC | Status: DC

## 2020-10-12 MED ORDER — HEPARIN SOD (PORK) LOCK FLUSH 100 UNIT/ML IV SOLN
500.0000 [IU] | Freq: Once | INTRAVENOUS | Status: AC
  Administered 2020-10-12: 500 [IU] via INTRAVENOUS
  Filled 2020-10-12: qty 5

## 2020-10-12 MED ORDER — PANTOPRAZOLE SODIUM 40 MG PO TBEC: 40.0000 mg | DELAYED_RELEASE_TABLET | Freq: Two times a day (BID) | ORAL | 11 refills | Status: DC

## 2020-10-12 MED ORDER — NADOLOL 20 MG PO TABS: 20.0000 mg | ORAL_TABLET | Freq: Every day | ORAL | 11 refills | Status: DC

## 2020-10-12 MED ORDER — RIFAXIMIN 550 MG PO TABS
550.0000 mg | ORAL_TABLET | Freq: Two times a day (BID) | ORAL | Status: DC
  Administered 2020-10-12 – 2020-10-13 (×3): 550 mg via ORAL
  Filled 2020-10-12 (×3): qty 1

## 2020-10-12 MED ORDER — CEPHALEXIN 500 MG PO CAPS: 500.0000 mg | ORAL_CAPSULE | Freq: Four times a day (QID) | ORAL | 0 refills | Status: AC

## 2020-10-12 MED ORDER — LACTULOSE 10 GM/15ML PO SOLN: 20.0000 g | Freq: Two times a day (BID) | ORAL | 0 refills | Status: AC

## 2020-10-12 MED ORDER — CARBAMIDE PEROXIDE 6.5 % OT SOLN
5.0000 [drp] | Freq: Two times a day (BID) | OTIC | Status: DC
  Administered 2020-10-12: 5 [drp] via OTIC
  Filled 2020-10-12: qty 15

## 2020-10-12 MED ORDER — CARBAMIDE PEROXIDE 6.5 % OT SOLN: 5.0000 [drp] | Freq: Two times a day (BID) | OTIC | 0 refills | Status: DC

## 2020-10-12 MED ORDER — ASPIRIN EC 81 MG PO TBEC: 81.0000 mg | DELAYED_RELEASE_TABLET | Freq: Every day | ORAL | 11 refills | Status: AC

## 2020-10-12 NOTE — Care Management Important Message (Signed)
Important Message  Patient Details  Name: [name] MRN: [id number] Date of Birth: 10-Jun-1930   Medicare Important Message Given:  Yes     Tommy Medal 10/12/2020, 2:13 PM

## 2020-10-12 NOTE — Progress Notes (Signed)
Port a cath in right upper chest flushed with 10 ml normal saline, followed by heparin lock flush for deaccess. Port deaccessed without complications, needle intact and without damage noted.

## 2020-10-12 NOTE — Clinical Social Work Note (Signed)
CSW informed that pts husband would like to speak with CSW about HH. CSW explained to Mr. Iversen what Felicita Gage, LCSW (see TOC transition note) had stated about the [facility] companies and setting pt up with outpatient PT. CSW visited pt and pts daughter in room to provide more information about outpatient PT services being set up. CSW explained importance of following up with PCP when new insurance plan is started so [facility] [place] PT services could begin. Due to Mr. Pancoast being main care giver CSW informed pts daughter of importance of Mr. Dall being first visitor due to visitor restrictions, pts daughter understanding. TOC signing off.

## 2020-10-12 NOTE — Plan of Care (Signed)
  Problem: Acute Rehab OT Goals (only OT should resolve) Goal: Pt. Will Perform Grooming Flowsheets (Taken 10/12/2020 0842) Pt Will Perform Grooming:  with supervision  standing Goal: Pt. Will Perform Upper Body Dressing Flowsheets (Taken 10/12/2020 0842) Pt Will Perform Upper Body Dressing:  with modified independence  sitting Goal: Pt. Will Perform Lower Body Dressing Flowsheets (Taken 10/12/2020 0842) Pt Will Perform Lower Body Dressing:  with supervision  sitting/lateral leans  sit to/from stand Goal: Pt. Will Transfer To Toilet Flowsheets (Taken 10/12/2020 [phone]) Pt Will Transfer to Toilet:  with supervision  ambulating  regular height toilet  grab bars Goal: Pt. Will Perform Toileting-Clothing Manipulation Flowsheets (Taken 10/12/2020 0842) Pt Will Perform Toileting - Clothing Manipulation and hygiene:  with supervision  sitting/lateral leans  sit to/from stand

## 2020-10-12 NOTE — Evaluation (Signed)
Occupational Therapy Evaluation Patient Details Name: [name] MRN: [id number] DOB: November 25, 1929 Today's Date: 10/12/2020    History of Present Illness [name]  is a 86 y.o. female, with history of thyroid nodule, pneumonia, hypertension, hyperlipidemia, GERD, breast cancer presents to the ER with a chief complaint of generalized weakness and near syncopal episode.  Most of history is obtained from husband at bedside.  It is difficult to distinguish between patient's inability to hear questions, versus her comprehension of the same questions.  In the room during the exam patient was putting her hand into her diaper scooping up the melena and putting it to her face, which indicates cognitive problems to me, but husband reports that she is normally alert and oriented x4, and attends to all her own ADLs.  Husband reports that patient had [place] around 12 PM.  They did not realize that that was indicative of GI bleed, and patient carried on normally with her day.  She had a total of 4 melanotic stools at home, and then started to have generalized weakness.  She called her husband that she was not able to move her legs and for him to come help.  He was not able to move her so they lowered her down to the floor.  He took some time to clean her up after that, and then they called EMS to come into the hospital.  He reports that she has been fatigued throughout the day, but she has had normal appetite she has not been nauseous or vomiting, and has not reported any kind of aches or pains.  He reports she was in her normal state of health up until today.  She is undergoing chemo with Dr. Raliegh Ip.     Clinical Impression   Pt up in chair on OT arrival and agreeable to OT evaluation. Pt reports independence in ADLs at home and family is available to assist as needed. Pt performing tasks at supervision/min guard level today including functional mobility with use of IV pole for stability. Encouraged use of RW at  home for stability and safety. Pt reports family is available to assist her as needed at home, recommend [facility] OT services on discharge to improve pt safety and independence in her home environment.     Follow Up Recommendations  Home health OT;Supervision/Assistance - 24 hour    Equipment Recommendations  None recommended by OT       Precautions / Restrictions Precautions Precautions: Fall Restrictions Weight Bearing Restrictions: No      Mobility Bed Mobility               General bed mobility comments: pt up in chair on OT arrival    Transfers Overall transfer level: Needs assistance Equipment used: None Transfers: Sit to/from Stand;Stand Pivot Transfers Sit to Stand: Supervision Stand pivot transfers: Min guard       General transfer comment: pt holding IV pole        ADL either performed or assessed with clinical judgement   ADL Overall ADL's : Needs assistance/impaired Eating/Feeding: Modified independent;Sitting   Grooming: Supervision/safety;Standing   Upper Body Bathing: Minimal assistance;Sitting Upper Body Bathing Details (indicate cue type and reason): min assist to reach back Lower Body Bathing: Supervison/ safety;Sitting/lateral leans;Sit to/from stand   Upper Body Dressing : Modified independent;Sitting   Lower Body Dressing: Supervision/safety;Sitting/lateral leans;Sit to/from stand   Toilet Transfer: Min guard;Ambulation;Grab bars Toilet Transfer Details (indicate cue type and reason): simulated with chair  Functional mobility during ADLs: Min guard       Vision Baseline Vision/History: No visual deficits Patient Visual Report: No change from baseline Vision Assessment?: No apparent visual deficits            Pertinent Vitals/Pain Pain Assessment: No/denies pain     Hand Dominance Right   Extremity/Trunk Assessment Upper Extremity Assessment Upper Extremity Assessment: Generalized weakness   Lower Extremity  Assessment Lower Extremity Assessment: Defer to PT evaluation   Cervical / Trunk Assessment Cervical / Trunk Assessment: Normal   Communication Communication Communication: HOH   Cognition Arousal/Alertness: Awake/alert Behavior During Therapy: WFL for tasks assessed/performed Overall Cognitive Status: Within Functional Limits for tasks assessed                                                Home Living Family/patient expects to be discharged to:: Private residence Living Arrangements: Spouse/significant other Available Help at Discharge: Family;Available 24 hours/day Type of Home: House Home Access: Stairs to enter [employer] of Steps: 4 Entrance Stairs-Rails: Right;Left;Can reach both Home Layout: One level     Bathroom Shower/Tub: Teacher, early years/pre: Standard     Home Equipment: Toilet riser;Walker - 2 wheels;Cane - single point;Grab bars - tub/shower          Prior Functioning/Environment Level of Independence: Needs assistance  Gait / Transfers Assistance Needed: household ambulator leaning on nearby objects for support, hand held assistance with longer distances ADL's / Homemaking Assistance Needed: assisted by family as needed for ADLs-mostly household and community tasks            OT Problem List: Decreased strength;Decreased activity tolerance;Impaired balance (sitting and/or standing);Decreased safety awareness;Decreased knowledge of use of DME or AE      OT Treatment/Interventions: Self-care/ADL training;Therapeutic exercise;DME and/or AE instruction;Therapeutic activities;Patient/family education    OT Goals(Current goals can be found in the care plan section) Acute Rehab OT Goals Patient Stated Goal: return home with family to assist OT Goal Formulation: With patient Time For Goal Achievement: 10/26/20 Potential to Achieve Goals: Good  OT Frequency: Min 1X/week    End of Session    Activity  Tolerance: Patient tolerated treatment well Patient left: in chair;with call bell/phone within reach;with chair alarm set  OT Visit Diagnosis: Muscle weakness (generalized) (M62.81)                Time: 0347-4259 OT Time Calculation (min): 13 min Charges:  OT General Charges $OT Visit: 1 Visit OT Evaluation $OT Eval Low Complexity: [place], OTR/L  [phone] 10/12/2020, 8:37 AM

## 2020-10-12 NOTE — Progress Notes (Signed)
Subjective:  Alert. Eating breakfast. Daughter at bedside. Patient has been eating very well. She has no complaints.   Objective: Vital signs in last 24 hours: Temp:  [97.9 F (36.6 C)-98.2 F (36.8 C)] 98.1 F (36.7 C) (12/16 0503) Pulse Rate:  [60-68] 63 (12/16 0503) Resp:  [15-22] 18 (12/16 0503) BP: (118-178)/(51-75) 123/53 (12/16 0503) SpO2:  [95 %-99 %] 98 % (12/16 0503) Weight:  [85.7 kg-85.8 kg] 85.8 kg (12/16 0500) Last BM Date: 10/11/20 General:   Alert, oriented to person/place/time. NAD Head:  Normocephalic and atraumatic. Eyes:  Sclera clear, no icterus.  Abdomen:  Soft, nontender and nondistended.    Extremities:  Without clubbing, deformity or edema. Neurologic:  Alert and  oriented x4;  grossly normal neurologically. No asterixis. Psych:  Alert and cooperative. Normal mood and affect.  Intake/Output from previous day: 12/15 0701 - 12/16 0700 In: 780 [P.O.:360; I.V.:420] Out: 600 [Urine:600] Intake/Output this shift: No intake/output data recorded.  Lab Results: CBC Recent Labs    10/10/20 0604 10/11/20 0621  WBC 9.6 11.2*  HGB 8.7* 8.9*  HCT 27.2* 27.6*  MCV 97.8 100.4*  PLT 166 175   BMET Recent Labs    10/10/20 0604 10/11/20 0621  NA 141 138  K 3.3* 3.8  CL 113* 111  CO2 21* 20*  GLUCOSE 122* 112*  BUN 50* 37*  CREATININE 1.51* 1.52*  CALCIUM 8.0* 7.7*   LFTs Recent Labs    10/10/20 0604 10/11/20 0621  BILITOT 1.5* 1.4*  ALKPHOS 77 76  AST 44* 43*  ALT 23 23  PROT 4.8* 4.7*  ALBUMIN 2.3* 2.3*   No results for input(s): LIPASE in the last 72 hours. PT/INR Recent Labs    10/10/20 0604 10/11/20 0621  LABPROT 15.1 15.2  INR 1.2 1.3*      Imaging Studies: CT HEAD WO CONTRAST  Result Date: 10/07/2020 CLINICAL DATA:  Altered mental status. EXAM: CT HEAD WITHOUT CONTRAST TECHNIQUE: Contiguous axial images were obtained from the base of the skull through the vertex without intravenous contrast. COMPARISON:  None. FINDINGS:  Brain: No evidence of acute infarction, hemorrhage, hydrocephalus, extra-axial collection or mass lesion/mass effect. Vascular: No hyperdense vessel or unexpected calcification. Skull: Normal. Negative for fracture or focal lesion. Sinuses/Orbits: No acute finding. Other: None. IMPRESSION: Normal head CT. Electronically Signed   By: Marijo Conception M.D.   On: 10/07/2020 15:34   [place] ASCITES (ABDOMEN LIMITED)  Result Date: 10/09/2020 CLINICAL DATA:  Ascites EXAM: LIMITED ABDOMEN ULTRASOUND FOR ASCITES TECHNIQUE: Limited ultrasound survey for ascites was performed in all four abdominal quadrants. COMPARISON:  CT abdomen and pelvis 08/03/2020 FINDINGS: Minimal ascites seen in the mid abdomen. No significant ascites is identified to permit paracentesis. IMPRESSION: Insufficient ascites for paracentesis. Electronically Signed   By: Lavonia Dana M.D.   On: 10/09/2020 15:23  [2 weeks]   Assessment: 86 year old female with history of cirrhosis (MELD Na 14), admitted with upper GI bleed. Developed hepatic encephalopathy since admission.  Upper GI bleed: Presented with melena. Hemoglobin 6.5 on admission. Received 3 units of packed red blood cells with posttransfusion hemoglobin 10.3. EGD 12/11 with old blood in stomach precluding full exam. Varices noted but no stigmata of bleeding. Esophagitis present. Repeat EGD 10/11/2020 showed grade 1 varices in the distal esophagus, LA grade B esophagitis, benign-appearing intrinsic mild stenosis of esophagus at 34 cm from the incisors, 4 cm hiatal hernia, erosive gastropathy with no stigmata of recent bleeding. She has completed octreotide infusion and PPI infusion. Nadolol  20 mg daily for variceal bleed prophylaxis.  Encephalopathy: In the setting of cirrhosis with upper GI bleed. Much improved. Currently on lactulose 20 g 3 times daily, xifaxin 500 mg 3 times daily (will switch to BID as per appropriate dose for HE).    Plan: 1. Follow-up H. pylori serologies 2. 2 g  sodium diet. 3. Follow-up liver serologies. 4. Titrate lactulose to have approximately 3 soft BMs daily. Will cut back to 20g bid for now.  5. Continue xifaxan 550mg  bid. 6. Complete 7-days antibiotics for SBP prophylaxis in the setting of GI bleeding/cirrhosis. 7. Follow up with Dr. Gala Romney as outpatient.   Laureen Ochs. Bernarda Caffey [facility] [phone] 12/16/202110:34 AM     LOS: 5 days

## 2020-10-12 NOTE — Telephone Encounter (Signed)
Patient scheduled and nurse on patient floor  will let patient know

## 2020-10-12 NOTE — Discharge Summary (Addendum)
Physician Discharge Summary  TIDA SANER VHQ:[id number] DOB: [birth year] DOA: 10/07/2020  PCP: [name], MD  Admit date: 10/07/2020 Discharge date: 10/12/2020  Time spent: 30 minutes  Recommendations for Outpatient Follow-up:  1. Needs outpatient follow-up with gastroenterology to be set up 2. New medications nadolol, lactulose, Xifaxan called into pharmacy in addition to Debrox eardrops 3. Needs outpatient Chem-12 CBC 1 week 4. Needs adjustment of Lasix dosing as per labs 5. Consider valve surgery if still an option in the outpatient setting once stabilizes 6. Consider outpatient goals of care 7. Resume asa at lower dose 81 on 12/20  Discharge Diagnoses:  Principal Problem:   GI bleed Active Problems:   Symptomatic anemia   AKI (acute kidney injury) ([place])   Near syncope   Moderate protein-calorie malnutrition (HCC)   Leukocytosis   Encephalopathy, hepatic (HCC)   Aortic valvar stenosis   Hepatic cirrhosis ([place])   Discharge Condition: Improved  Diet recommendation: Heart healthy very low salt  Filed Weights   10/11/20 0441 10/11/20 1442 10/12/20 0500  Weight: 85.7 kg 85.7 kg 85.8 kg    History of present illness:  86 year old WF Metastatic right breast cancer followed by Dr. [name] status post chemo currently on Enhertu every 3 weekly Cirrhosis/ascites cryptogenic cause HTN HLD  Admit 10/07/2020 from home with frank melena multiple stools On admission hemoglobin 6.5 T-max 94-had AKI creatinine up from 1.1-1.4 Transfused 3 units on admission-started on Protonix gtt.-started on octreotide gtt. in addition EGD performed 12/11 was incomplete And repeat one was done on 12/16  Hospital Course:  1. Probable upper GI bleed from gastric varices a. Endoscopy 12/11 showed varices b. Repeat endoscopy scheduled for 12/15 and performed showing esophageal stricture which was not manipulated at 34 cm and hiatal hernia c. Continue nadolol started this  admissionrifaximin --switch ceftriaxone to Keflex on discharge to complete 2 more days d. Nadolol started this admission and will need to be continued 2. 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 Ported this admission on lactulose cut back 20 twice daily in the next day or so initially but resume every other day dose b. Above meds as noted c. Holding Lasix 20 at this time 3. Metastatic right breast cancer on chemo a. Currently on Enhertu which does not cause cirrhosis but can cause an increase in transaminases b. Defer to Dr. [name] but would hold this medication ongoing for now 4. Severe aortic stenosis a. Some consideration for outpatient follow-up echo and work-up? b. May need Lasix added back 5. AKI on admission a. Quite azotemic on admission now much better closer to baseline 6. Hypokalemia on admission a. Improved  EGD repeat Procedures:  12/15   Normal mucosa of proximal and esophagus Small grade 1 esophageal varices without stigmata of bleed. Erosions involving the distal 3 cm of esophagus Noncritical stricture at GE junction which was not manipulated.  GE J at 34 cm from the incisors. 1 cm sized sliding hiatal hernia. Gastritis with single prepyloric erosion without stigmata of bleed. Normal bulbar and post bulbar mucosa.  Consultations:  GI  Discharge Exam: Vitals:   10/11/20 2019 10/12/20 0503  BP: (!) 129/51 (!) 123/53  Pulse: 60 63  Resp: 18 18  Temp: 98 F (36.7 C) 98.1 F (36.7 C)  SpO2: 98% 98%    General awake alert coherent no distress not confused he knows time date person Cardiovascular: S1-S2 murmur noted Respiratory: Clinically clear no added sound no rales no rhonchi No tremor Moving all 4  limbs equally Abdomen soft no rebound cannot appreciate organomegaly  Discharge Instructions   Discharge Instructions    Diet - low sodium heart healthy   Complete by: As directed    Discharge instructions   Complete by:  As directed    Your diagnosis admission with confusion probably related to your cirrhosis of the liver which is a condition where the liver starts to not work properly-this is associated with bleeding and you had bleeding from various areas in your stomach I would not take aspirin for the time being but follow-up wit yourh regular stomach doctor We have prescribed numerous new medications one of them was called lactulose this is a medication that helps detoxify the blood (the liver is the detox 4 the body) and ensure that you are not confused and causes diarrhea you need to continue this in addition you will be on Xifaxan which sterilizes the gut You will also need to stop your aspirin for the time being and monitor yourself for bleeding in addition to new medication called nadolol which helps prevent the bleeding and your esophagus should be taken Gastroenterology will follow up with you you will resume various other medications please get labs in a week at your primary physician office Good luck and have a good holiday   Increase activity slowly   Complete by: As directed      Allergies as of 10/12/2020      Reactions   Penicillins Rash   Has patient had a PCN reaction causing immediate rash, facial/tongue/throat swelling, SOB or lightheadedness with hypotension: No Has patient had a PCN reaction causing severe rash involving mucus membranes or skin necrosis: No Has patient had a PCN reaction that required hospitalization: No Has patient had a PCN reaction occurring within the last 10 years: No If all of the above answers are "NO", then may proceed with Cephalosporin use.      Medication List    STOP taking these medications   aspirin 325 MG tablet Replaced by: aspirin EC 81 MG tablet   cetirizine 10 MG tablet Commonly known as: ZYRTEC   OneTouch Ultra test strip Generic drug: glucose blood     TAKE these medications   acetaminophen 325 MG tablet Commonly known as:  TYLENOL Take 650 mg by mouth every 6 (six) hours as needed for mild pain.   aspirin EC 81 MG tablet Take 1 tablet (81 mg total) by mouth daily. Swallow whole. Start taking on: October 16, 2020 Replaces: aspirin 325 MG tablet   carbamide peroxide 6.5 % OTIC solution Commonly known as: DEBROX Place 5 drops into both ears 2 (two) times daily.   cephALEXin 500 MG capsule Commonly known as: KEFLEX Take 1 capsule (500 mg total) by mouth 4 (four) times daily for 2 days.   furosemide 20 MG tablet Commonly known as: LASIX Take 1 tablet (20 mg total) by mouth daily. What changed: when to take this   hydrocortisone 1 % ointment Apply 1 application topically 2 (two) times daily.   lactulose 10 GM/15ML solution Commonly known as: CHRONULAC Take 30 mLs (20 g total) by mouth 2 (two) times daily.   lidocaine-prilocaine cream Commonly known as: EMLA Apply to port site one hour prior to appointment and cover with plastic wrap.   Misc. Devices Misc Please provide lymphedema sleeve for right arm   multivitamin with minerals Tabs tablet Take 1 tablet by mouth daily.   nadolol 20 MG tablet Commonly known as: CORGARD Take 1 tablet (20  mg total) by mouth daily. Start taking on: October 13, 2020   pantoprazole 40 MG tablet Commonly known as: PROTONIX Take 1 tablet (40 mg total) by mouth 2 (two) times daily before a meal.   rifaximin 550 MG Tabs tablet Commonly known as: XIFAXAN Take 1 tablet (550 mg total) by mouth 2 (two) times daily.            Durable Medical Equipment  (From admission, onward)         Start     Ordered   10/12/20 1122  DME 3-in-1  Once        10/12/20 1124         Allergies  Allergen Reactions  . Penicillins Rash    Has patient had a PCN reaction causing immediate rash, facial/tongue/throat swelling, SOB or lightheadedness with hypotension: No Has patient had a PCN reaction causing severe rash involving mucus membranes or skin necrosis: No Has  patient had a PCN reaction that required hospitalization: No Has patient had a PCN reaction occurring within the last 10 years: No If all of the above answers are "NO", then may proceed with Cephalosporin use.     [place] Follow up.   Why: For outpatient palliative Contact information: [address] [place] [address] [phone]                The results of significant diagnostics from this hospitalization (including imaging, microbiology, ancillary and laboratory) are listed below for reference.    Significant Diagnostic Studies: CT HEAD WO CONTRAST  Result Date: 10/07/2020 CLINICAL DATA:  Altered mental status. EXAM: CT HEAD WITHOUT CONTRAST TECHNIQUE: Contiguous axial images were obtained from the base of the skull through the vertex without intravenous contrast. COMPARISON:  None. FINDINGS: Brain: No evidence of acute infarction, hemorrhage, hydrocephalus, extra-axial collection or mass lesion/mass effect. Vascular: No hyperdense vessel or unexpected calcification. Skull: Normal. Negative for fracture or focal lesion. Sinuses/Orbits: No acute finding. Other: None. IMPRESSION: Normal head CT. Electronically Signed   By: Marijo Conception M.D.   On: 10/07/2020 15:34   [place] ASCITES (ABDOMEN LIMITED)  Result Date: 10/09/2020 CLINICAL DATA:  Ascites EXAM: LIMITED ABDOMEN ULTRASOUND FOR ASCITES TECHNIQUE: Limited ultrasound survey for ascites was performed in all four abdominal quadrants. COMPARISON:  CT abdomen and pelvis 08/03/2020 FINDINGS: Minimal ascites seen in the mid abdomen. No significant ascites is identified to permit paracentesis. IMPRESSION: Insufficient ascites for paracentesis. Electronically Signed   By: Lavonia Dana M.D.   On: 10/09/2020 15:23    Microbiology: Recent Results (from the past 240 hour(s))  Resp Panel by RT-PCR (Flu A&B, Covid) Nasopharyngeal Swab     Status: None   Collection Time: 10/07/20  1:11 AM    Specimen: Nasopharyngeal Swab; Nasopharyngeal(NP) swabs in vial transport medium  Result Value Ref Range Status   SARS Coronavirus 2 by RT PCR NEGATIVE NEGATIVE Final    Comment: (NOTE) SARS-CoV-2 target nucleic acids are NOT DETECTED.  The SARS-CoV-2 RNA is generally detectable in upper respiratory specimens during the acute phase of infection. The lowest concentration of SARS-CoV-2 viral copies this assay can detect is 138 copies/mL. A negative result does not preclude SARS-Cov-2 infection and should not be used as the sole basis for treatment or other patient management decisions. A negative result may occur with  improper specimen collection/handling, submission of specimen other than nasopharyngeal swab, presence of viral mutation(s) within the areas targeted by this assay, and inadequate  number of viral copies(<138 copies/mL). A negative result must be combined with clinical observations, patient history, and epidemiological information. The expected result is Negative.  Fact Sheet for Patients:  EntrepreneurPulse.com.au  Fact Sheet for Healthcare Providers:  IncredibleEmployment.be  This test is no t yet approved or cleared by the [place] FDA and  has been authorized for detection and/or diagnosis of SARS-CoV-2 by FDA under an Emergency Use Authorization (EUA). This EUA will remain  in effect (meaning this test can be used) for the duration of the COVID-19 declaration under Section 564(b)(1) of the Act, 21 U.S.C.section 360bbb-3(b)(1), unless the authorization is terminated  or revoked sooner.       Influenza A by PCR NEGATIVE NEGATIVE Final   Influenza B by PCR NEGATIVE NEGATIVE Final    Comment: (NOTE) The Xpert Xpress SARS-CoV-2/FLU/RSV plus assay is intended as an aid in the diagnosis of influenza from Nasopharyngeal swab specimens and should not be used as a sole basis for treatment. Nasal washings and aspirates are  unacceptable for Xpert Xpress SARS-CoV-2/FLU/RSV testing.  Fact Sheet for Patients: EntrepreneurPulse.com.au  Fact Sheet for Healthcare Providers: IncredibleEmployment.be  This test is not yet approved or cleared by the [place] FDA and has been authorized for detection and/or diagnosis of SARS-CoV-2 by FDA under an Emergency Use Authorization (EUA). This EUA will remain in effect (meaning this test can be used) for the duration of the COVID-19 declaration under Section 564(b)(1) of the Act, 21 U.S.C. section 360bbb-3(b)(1), unless the authorization is terminated or revoked.  Performed at [facility], [address], [place], [place] [address]      Labs: Basic Metabolic Panel: Recent Labs  Lab 10/05/20 1202 10/07/20 0017 10/07/20 0940 10/08/20 0527 10/09/20 0628 10/10/20 0604 10/11/20 0621  NA 135   < > 138 139 139 141 138  K 3.4*   < > 4.2 4.0 3.3* 3.3* 3.8  CL 105   < > 109 110 111 113* 111  CO2 20*   < > 20* 18* 19* 21* 20*  GLUCOSE 104*   < > 170* 192* 149* 122* 112*  BUN 21   < > 81* 93* 72* 50* 37*  CREATININE 1.18*   < > 1.40* 1.83* 1.83* 1.51* 1.52*  CALCIUM 8.7*   < > 8.2* 8.3* 7.9* 8.0* 7.7*  MG 1.7  --  2.3  --  2.1  --   --   PHOS  --   --   --   --  3.8  --   --    < > = values in this interval not displayed.   Liver Function Tests: Recent Labs  Lab 10/07/20 0017 10/07/20 0940 10/09/20 0628 10/10/20 0604 10/11/20 0621  AST 42* 39 46* 44* 43*  ALT 23 21 23 23 23   ALKPHOS 125 104 72 77 76  BILITOT 0.8 1.4* 1.4* 1.5* 1.4*  PROT 5.3* 4.7* 4.7* 4.8* 4.7*  ALBUMIN 2.4* 2.3* 2.3* 2.3* 2.3*   No results for input(s): LIPASE, AMYLASE in the last 168 hours. Recent Labs  Lab 10/07/20 1800 10/09/20 0628 10/10/20 0604  AMMONIA 73* 16 43*   CBC: Recent Labs  Lab 10/08/20 1732 10/09/20 0628 10/10/20 0604 10/11/20 0621 10/12/20 0556  WBC 13.5* 11.5* 9.6 11.2* 10.4  NEUTROABS 8.9* 7.3 5.6 6.0 5.8  HGB  9.5* 9.0* 8.7* 8.9* 9.4*  HCT 28.0* 27.3* 27.2* 27.6* 28.3*  MCV 94.3 96.8 97.8 100.4* 99.3  PLT 188 173 166 175 187   Cardiac Enzymes: No  results for input(s): CKTOTAL, CKMB, CKMBINDEX, TROPONINI in the last 168 hours. BNP: BNP (last 3 results) No results for input(s): BNP in the last 8760 hours.  ProBNP (last 3 results) No results for input(s): PROBNP in the last 8760 hours.  CBG: Recent Labs  Lab 10/11/20 1720 10/11/20 2119 10/11/20 2341 10/12/20 0442 10/12/20 0819  GLUCAP 97 101* 77 85 94       Signed:  Nita Sells MD   [facility] 10/12/2020, 11:24 AM

## 2020-10-12 NOTE — TOC Transition Note (Signed)
Transition of Care [facility]) - CM/SW Discharge Note   Patient Details  Name: [name] MRN: [id number] Date of Birth: Mar 22, 1930  Transition of Care [facility]) CM/SW Contact:  Natasha Bence, LCSW Phone Number: 10/12/2020, 5:32 PM   Clinical Narrative:    CSW spoke with patient's daughter about SNF referral. Patient's daughter reported that they were declining SNF and would prefer HH. CSW contacted local [facility] agencies. [facility] reported that they would not be able to take patient. [facility] reported that they would not have a physical therapist until January 1st. [facility] reported that patient would be required to pay $83 per visit. Patien'ts daughter reported that patients husband would contact CSW. CSW did not receive call. CSW contacted patients husband. Patient's husband agreeable to OPPT and then to follow up with PCP for [facility] services once patient's insurance plan is upgraded and [facility] is available for [facility]. CSW referred patient to [facility] re[facility]. TOC signing off.    Final next level of care: [place] Barriers to Discharge: Continued Medical Work up   Patient Goals and CMS Choice Patient states their goals for this hospitalization and ongoing recovery are:: Return home CMS Medicare.gov Compare Post Acute Care list provided to:: Patient Choice offered to / list presented to : [facility]  Discharge Placement                       Discharge Plan and Services In-house Referral: Clinical Social Work Discharge Planning Services: NA Post Acute Care Choice:  (OP palliative)          DME Arranged: N/A DME Agency: NA                  Social Determinants of Health (SDOH) Interventions     Readmission Risk Interventions No flowsheet data found.

## 2020-10-12 NOTE — Telephone Encounter (Signed)
Patient needs hospital follow up in 3-4 weeks. Please make arrangements. She is RMR patient.

## 2020-10-13 LAB — CBC WITH DIFFERENTIAL/PLATELET
Abs Immature Granulocytes: 0.35 10*3/uL — ABNORMAL HIGH (ref 0.00–0.07)
Basophils Absolute: 0.1 10*3/uL (ref 0.0–0.1)
Basophils Relative: 1 %
Eosinophils Absolute: 0.8 10*3/uL — ABNORMAL HIGH (ref 0.0–0.5)
Eosinophils Relative: 8 %
HCT: 27.2 % — ABNORMAL LOW (ref 36.0–46.0)
Hemoglobin: 8.8 g/dL — ABNORMAL LOW (ref 12.0–15.0)
Immature Granulocytes: 4 %
Lymphocytes Relative: 23 %
Lymphs Abs: 2.3 10*3/uL (ref 0.7–4.0)
MCH: 32.2 pg (ref 26.0–34.0)
MCHC: 32.4 g/dL (ref 30.0–36.0)
MCV: 99.6 fL (ref 80.0–100.0)
Monocytes Absolute: 1.3 10*3/uL — ABNORMAL HIGH (ref 0.1–1.0)
Monocytes Relative: 13 %
Neutro Abs: 5.2 10*3/uL (ref 1.7–7.7)
Neutrophils Relative %: 51 %
Platelets: 163 10*3/uL (ref 150–400)
RBC: 2.73 MIL/uL — ABNORMAL LOW (ref 3.87–5.11)
RDW: 17.2 % — ABNORMAL HIGH (ref 11.5–15.5)
WBC: 10 10*3/uL (ref 4.0–10.5)
nRBC: 0 % (ref 0.0–0.2)

## 2020-10-13 LAB — GLUCOSE, CAPILLARY
Glucose-Capillary: 118 mg/dL — ABNORMAL HIGH (ref 70–99)
Glucose-Capillary: 108 mg/dL — ABNORMAL HIGH (ref 70–99)
Glucose-Capillary: 162 mg/dL — ABNORMAL HIGH (ref 70–99)

## 2020-10-13 LAB — MITOCHONDRIAL ANTIBODIES: Mitochondrial M2 Ab, IgG: <20 Units (ref 0.0–20.0)

## 2020-10-13 LAB — H. PYLORI ANTIBODY, IGG: H Pylori IgG: 0.43 Index Value (ref 0.00–0.79)

## 2020-10-13 LAB — PROTIME-INR
INR: 1.2 (ref 0.8–1.2)
Prothrombin Time: 14.7 seconds (ref 11.4–15.2)

## 2020-10-13 NOTE — Progress Notes (Signed)
Long discussion with husband on the phone several times between 1216 and 1217-they are interested in skilled care We will let social worker be aware of the same however I do not know if patient qualifies Patient can discharge at any time to either facility or home with home health as per social work No changes to discharge summary

## 2020-10-13 NOTE — Progress Notes (Signed)
Physical Therapy Treatment Patient Details Name: [name] MRN: [id number] DOB: [birth year] Today's Date: 10/13/2020    History of Present Illness [name]  is a 86 y.o. female, with history of thyroid nodule, pneumonia, hypertension, hyperlipidemia, GERD, breast cancer presents to the ER with a chief complaint of generalized weakness and near syncopal episode.  Most of history is obtained from husband at bedside.  It is difficult to distinguish between patient's inability to hear questions, versus her comprehension of the same questions.  In the room during the exam patient was putting her hand into her diaper scooping up the melena and putting it to her face, which indicates cognitive problems to me, but husband reports that she is normally alert and oriented x4, and attends to all her own ADLs.  Husband reports that patient had [place] around 12 PM.  They did not realize that that was indicative of GI bleed, and patient carried on normally with her day.  She had a total of 4 melanotic stools at home, and then started to have generalized weakness.  She called her husband that she was not able to move her legs and for him to come help.  He was not able to move her so they lowered her down to the floor.  He took some time to clean her up after that, and then they called EMS to come into the hospital.  He reports that she has been fatigued throughout the day, but she has had normal appetite she has not been nauseous or vomiting, and has not reported any kind of aches or pains.  He reports she was in her normal state of health up until today.  She is undergoing chemo with Dr. Raliegh Ip.  Unfortunately further history cannot be obtained as patient is not answering questions.    PT Comments    Patient seated in chair at beginning of session. Patient does not require physical assist to transfer to standing with RW. Patient ambulates increased distance today without loss of balance with RW and min guard for  safety/balance. Patient ends session seated in chair. Discussed with patient, patient's husband and daughter HHPT and going home  vs. SNF rehab and educated them on the differences. Educated and discussed that it would be beneficial to assist patient with mobility of the patient for safety. If the patient does return home, the patient needs a shower bench as they have a tub shower so it would be unsafe for the patient to step over the tub due to impaired balance. Patient also would benefit from a shower bench in order to reduce the risk of falling in the shower. Patient will benefit from continued physical therapy in hospital and recommended venue below to increase strength, balance, endurance for safe ADLs and gait.    Follow Up Recommendations  Supervision for mobility/OOB;Supervision - Intermittent;SNF     Equipment Recommendations  Other (comment) (shower bench)    Recommendations for Other Services       Precautions / Restrictions Precautions Precautions: Fall Restrictions Weight Bearing Restrictions: No    Mobility  Bed Mobility               General bed mobility comments: Patient seated in chair upon arrival for session  Transfers Overall transfer level: Needs assistance Equipment used: Rolling walker (2 wheeled) Transfers: Sit to/from [facility] Sit to Stand: Supervision Stand pivot transfers: Min guard       General transfer comment: Patient transfers to standing with RW  Ambulation/Gait Ambulation/Gait assistance: Min Gaffer (Feet): 60 Feet Assistive device: Rolling walker (2 wheeled) Gait Pattern/deviations: Decreased step length - right;Decreased step length - left;Decreased stride length Gait velocity: decreased   General Gait Details: slow cadence with RW, min guard for safety/balance, no loss of balance   Stairs             Wheelchair Mobility    Modified Rankin (Stroke Patients Only)        Balance Overall balance assessment: Needs assistance Sitting-balance support: Feet supported Sitting balance-Leahy Scale: Good Sitting balance - Comments: fair/good seated at EOB   Standing balance support: During functional activity;Bilateral upper extremity supported Standing balance-Leahy Scale: Fair Standing balance comment: fair/good using RW                            Cognition Arousal/Alertness: Awake/alert Behavior During Therapy: WFL for tasks assessed/performed Overall Cognitive Status: Within Functional Limits for tasks assessed                                        Exercises General Exercises - Lower Extremity Long Arc Quad: AROM;Both;10 reps;Seated Hip Flexion/Marching: AROM;Both;10 reps;Seated Toe Raises: AROM;Both;10 reps;Seated Heel Raises: AROM;Both;10 reps;Seated    General Comments        Pertinent Vitals/Pain Pain Assessment: No/denies pain    Home Living                      Prior Function            PT Goals (current goals can now be found in the care plan section) Acute Rehab PT Goals Patient Stated Goal: return home with family to assist PT Goal Formulation: With patient/family Time For Goal Achievement: 10/25/20 Potential to Achieve Goals: Good Progress towards PT goals: Progressing toward goals    Frequency    Min 3X/week      PT Plan Equipment recommendations need to be updated;Current plan remains appropriate    Co-evaluation              AM-PAC PT "6 Clicks" Mobility   Outcome Measure  Help needed turning from your back to your side while in a flat bed without using bedrails?: A Little Help needed moving from lying on your back to sitting on the side of a flat bed without using bedrails?: A Little Help needed moving to and from a bed to a chair (including a wheelchair)?: A Little Help needed standing up from a chair using your arms (e.g., wheelchair or bedside chair)?: A  Little Help needed to walk in hospital room?: A Little Help needed climbing 3-5 steps with a railing? : A Lot 6 Click Score: 17    End of Session   Activity Tolerance: Patient tolerated treatment well;Patient limited by fatigue Patient left: in chair;with call bell/phone within reach;with family/visitor present Nurse Communication: Mobility status PT Visit Diagnosis: Unsteadiness on feet (R26.81);Other abnormalities of gait and mobility (R26.89);Muscle weakness (generalized) (M62.81)     Time: 3664-4034 PT Time Calculation (min) (ACUTE ONLY): 20 min  Charges:  $Therapeutic Activity: 8-22 mins                      2:43 PM, 10/13/20 Mearl Latin PT, DPT Physical Therapist at [facility]

## 2020-10-13 NOTE — TOC Transition Note (Signed)
Transition of Care [facility]) - CM/SW Discharge Note  Patient Details  Name: [name] MRN: [id number] Date of Birth: 1929-11-14  Transition of Care [facility]) CM/SW Contact:  Sherie Don, LCSW Phone Number: 10/13/2020, 3:17 PM  Clinical Narrative: Patient ready for discharge. Family agreeable to [facility] and are aware that due to patient's insurance these services will start January 1. Family agreeable to private pay for tub bench. DME referral made to [facility]. Family agreeable to [facility]. CSW made [facility] referral to [place] with [facility]. Tim aware of limitations with patient's insurance. HH orders are in. TOC signing off.  Final next level of care: [place] Barriers to Discharge: Barriers Resolved  Patient Goals and CMS Choice Patient states their goals for this hospitalization and ongoing recovery are:: Return home CMS Medicare.gov Compare Post Acute Care list provided to:: Patient Choice offered to / list presented to : [facility]  Discharge Plan and Services In-house Referral: Clinical Social Work Discharge Planning Services: NA Post Acute Care Choice:  (OP palliative)          DME Arranged: Tub bench DME Agency: [employer] Date DME Agency Contacted: 10/13/20 Representative spoke with at DME Agency: [place]: PT,RN,Speech [place] Work [place]: [facility] [employer] [facility] [employer] Date [place]: 10/13/20 Representative spoke with at [place]: Tim  Readmission Risk Interventions No flowsheet data found.

## 2020-10-13 NOTE — Care Management Important Message (Signed)
Important Message  Patient Details  Name: [name] MRN: [id number] Date of Birth: 1930-05-29   Medicare Important Message Given:  Yes     Tommy Medal 10/13/2020, 3:59 PM

## 2020-10-16 ENCOUNTER — Other Ambulatory Visit: Payer: Self-pay | Admitting: *Deleted

## 2020-10-16 ENCOUNTER — Encounter (HOSPITAL_COMMUNITY): Payer: Self-pay | Admitting: Internal Medicine

## 2020-10-16 ENCOUNTER — Telehealth: Payer: Self-pay | Admitting: Internal Medicine

## 2020-10-16 NOTE — Patient Outreach (Signed)
[facility]) Care Management  10/16/2020  JARELLY RINCK 1930-05-18 [id number]   RED ON EMMI ALERT - General Discharge Day # 1 Date: 12/19/201 Red Alert Reason: Not read discharge papers, No follow up scheduled, Unfilled prescriptions, and Other questions/problems   Outreach attempt #1, unsuccessful, HIPAA compliant voice message left.   Plan: RN CM will send outreach letter and follow up within the next 3-4 business days.  [name], [place], MSN [place] [phone]

## 2020-10-16 NOTE — Telephone Encounter (Signed)
PATIENT CALLED AND SAID THAT SHE IS SUPPOSED TO HAVE PRIOR APPROVAL FROM [facility] FOR [name]

## 2020-10-17 NOTE — Telephone Encounter (Signed)
PA has been submitted through [email or web address]. waiting on an approval or denial for Xifaxan. Pt's spouse would like to bring pt in sooner. Please place pt on a cancellation list.

## 2020-10-17 NOTE — Telephone Encounter (Signed)
PA for Xifaxan 550 mg has been approved through 04/15/2021.  Talked with the pharmacy and they were notified of approval.

## 2020-10-17 NOTE — Telephone Encounter (Signed)
Added to wait list.

## 2020-10-18 DIAGNOSIS — E785 Hyperlipidemia, unspecified: Secondary | ICD-10-CM | POA: Diagnosis not present

## 2020-10-18 DIAGNOSIS — J449 Chronic obstructive pulmonary disease, unspecified: Secondary | ICD-10-CM | POA: Diagnosis not present

## 2020-10-18 DIAGNOSIS — I110 Hypertensive heart disease with heart failure: Secondary | ICD-10-CM | POA: Diagnosis not present

## 2020-10-18 DIAGNOSIS — K7290 Hepatic failure, unspecified without coma: Secondary | ICD-10-CM | POA: Diagnosis not present

## 2020-10-18 DIAGNOSIS — K922 Gastrointestinal hemorrhage, unspecified: Secondary | ICD-10-CM | POA: Diagnosis not present

## 2020-10-18 DIAGNOSIS — K7460 Unspecified cirrhosis of liver: Secondary | ICD-10-CM | POA: Diagnosis not present

## 2020-10-18 DIAGNOSIS — E440 Moderate protein-calorie malnutrition: Secondary | ICD-10-CM | POA: Diagnosis not present

## 2020-10-18 DIAGNOSIS — D630 Anemia in neoplastic disease: Secondary | ICD-10-CM | POA: Diagnosis not present

## 2020-10-18 DIAGNOSIS — I509 Heart failure, unspecified: Secondary | ICD-10-CM | POA: Diagnosis not present

## 2020-10-18 DIAGNOSIS — C50411 Malignant neoplasm of upper-outer quadrant of right female breast: Secondary | ICD-10-CM | POA: Diagnosis not present

## 2020-10-18 NOTE — Telephone Encounter (Signed)
Continue taking Lactulose, titrate for 3-4 soft bowel movements a day. Unfortunately there's no generic or alternative for Xifaxan (which works WITH lactulose to prevent mental status changes).  Continue to work toward patient assistance and hopefully we can get this to an affordable level for her.

## 2020-10-18 NOTE — Telephone Encounter (Signed)
Lmom, waiting on a return call.  

## 2020-10-18 NOTE — Telephone Encounter (Signed)
Pt's daughter called. Pts Xifaxan cost is still over $700.00 even though it was approved under insurance. Pt assistance is an option but will delay treatment until it is approved. Is there anything else pt should take in the place of Xifaxan per pts family? Please advise.

## 2020-10-19 ENCOUNTER — Other Ambulatory Visit: Payer: Self-pay | Admitting: *Deleted

## 2020-10-19 DIAGNOSIS — C7989 Secondary malignant neoplasm of other specified sites: Secondary | ICD-10-CM | POA: Diagnosis not present

## 2020-10-19 DIAGNOSIS — C50411 Malignant neoplasm of upper-outer quadrant of right female breast: Secondary | ICD-10-CM | POA: Diagnosis not present

## 2020-10-19 DIAGNOSIS — Z515 Encounter for palliative care: Secondary | ICD-10-CM | POA: Diagnosis not present

## 2020-10-19 DIAGNOSIS — Z171 Estrogen receptor negative status [ER-]: Secondary | ICD-10-CM | POA: Diagnosis not present

## 2020-10-19 DIAGNOSIS — N1832 Chronic kidney disease, stage 3b: Secondary | ICD-10-CM | POA: Diagnosis not present

## 2020-10-19 DIAGNOSIS — K7460 Unspecified cirrhosis of liver: Secondary | ICD-10-CM | POA: Diagnosis not present

## 2020-10-19 NOTE — Telephone Encounter (Signed)
Mailed pt assistance forms and will discuss further when pts family returns call.

## 2020-10-19 NOTE — Patient Outreach (Signed)
[facility]) Care Management  [place]  10/19/2020   [name] Mar 02, 1930 [id number]    EMMI Alert Follow Up  EMMI:  General Red on EMMI Alert: EMMI Day: #1 Date:  10/15/2020 Red Alert Reason:  Not read discharge papers, No follow up scheduled, unfilled prescriptions, and Other questions/problems   Outreach Attempt:  Outreach attempt #2 to patient for [facility] General Discharge Red Alert. No answer. RN Care Coordinator left voicemail message along with contact information.  Plan:  RN Care Coordinator will make another outreach attempt to patient within 3-4 business days if no return call back from patient.   [place] [phone] [email or web address]

## 2020-10-23 ENCOUNTER — Ambulatory Visit (HOSPITAL_COMMUNITY): Payer: Medicare HMO

## 2020-10-23 DIAGNOSIS — R945 Abnormal results of liver function studies: Secondary | ICD-10-CM | POA: Diagnosis not present

## 2020-10-23 DIAGNOSIS — E782 Mixed hyperlipidemia: Secondary | ICD-10-CM | POA: Diagnosis not present

## 2020-10-23 DIAGNOSIS — R7303 Prediabetes: Secondary | ICD-10-CM | POA: Diagnosis not present

## 2020-10-23 DIAGNOSIS — C50911 Malignant neoplasm of unspecified site of right female breast: Secondary | ICD-10-CM | POA: Diagnosis not present

## 2020-10-23 DIAGNOSIS — I129 Hypertensive chronic kidney disease with stage 1 through stage 4 chronic kidney disease, or unspecified chronic kidney disease: Secondary | ICD-10-CM | POA: Diagnosis not present

## 2020-10-23 DIAGNOSIS — N1832 Chronic kidney disease, stage 3b: Secondary | ICD-10-CM | POA: Diagnosis not present

## 2020-10-23 DIAGNOSIS — E559 Vitamin D deficiency, unspecified: Secondary | ICD-10-CM | POA: Diagnosis not present

## 2020-10-23 DIAGNOSIS — D631 Anemia in chronic kidney disease: Secondary | ICD-10-CM | POA: Diagnosis not present

## 2020-10-23 DIAGNOSIS — E663 Overweight: Secondary | ICD-10-CM | POA: Diagnosis not present

## 2020-10-23 DIAGNOSIS — R7301 Impaired fasting glucose: Secondary | ICD-10-CM | POA: Diagnosis not present

## 2020-10-24 ENCOUNTER — Other Ambulatory Visit: Payer: Self-pay | Admitting: *Deleted

## 2020-10-24 ENCOUNTER — Encounter: Payer: Self-pay | Admitting: *Deleted

## 2020-10-24 ENCOUNTER — Telehealth: Payer: Self-pay | Admitting: Internal Medicine

## 2020-10-24 NOTE — Patient Outreach (Signed)
[facility]) Care Management  [facility] Care Manager  10/24/2020   [name] Dec 13, 1929 [id number]   EMMI Alert Follow Up  EMMI:  General Red on EMMI Alert: EMMI Day: #1 Date:  10/15/2020 Red Alert Reason:  Not read discharge papers, No follow up scheduled, unfilled prescriptions, and Other questions/problems    Outreach Attempt:  Outreach attempt #3 to patient for [facility] General Discharge Red Alert and Nurse Call line follow up. No answer. RN Care Coordinator left voicemail message along with contact information.  Plan:  RN Care Coordinator will make final outreach attempt within the next 3 weeks if no return call back.   [name] RN [facility] Care Management  RN Telephonic Care Coordinator [phone] [name]

## 2020-10-24 NOTE — Telephone Encounter (Signed)
Patient called asking about the xiafaxin alternatives.  [phone]

## 2020-10-24 NOTE — Patient Outreach (Addendum)
[facility]) Care Management  [facility] Care Manager  10/24/2020   [name] [birth year] [id number]   EMMI Alert Follow Up  EMMI:  General Red on EMMI Alert: EMMI Day: #1 Date:  10/15/2020 Red Alert Reason:  Not read discharge papers, No follow up scheduled, unfilled prescriptions, and Other questions/problems   RN Telephonic Care Coordinator Initial Assessment  Referral Date:  10/16/20 Referral Source:  [facility] General Discharge Red Alert Reason for Referral:  EMMI Red Alert Insurance:  [employer] Medicare   Outreach Attempt:  Received telephone call back from patient's husband.  HIPAA verified with husband, states patient is hard of hearing and Release of Information on file.  Did speak with patient during conversation and she request we continue to speak with husband.  Discussed with husband red alerts on EMMI platform (Not read discharge papers, no follow up scheduled, unfilled prescriptions, and other questions/problems).  Husband reports patient has had follow up appointment with primary care provider and has scheduled gastroenterology appointment.  States primary care has refilled prescription for Lactulose and he is working with Gastroenterology about being unable to afford Xifaxan prescription (awaiting call back from them).  He has spoken with [facility] for home health and they are stating they will start services after the new year.  Reports [facility] has already been out to see patient.  Denies any questions or concerns regarding discharge at this point.  [facility] services reviewed and discussed.  Patient and husband verbally consent to services and outreaches.  Initial telephone assessment completed.  Social:  Patient lives at home with husband, whom is the primary caregiver.  Does get assistance from children.  Reports needing assistance with bathing and dressing of ADLs and cooking and cleaning of IADLs (patient still assist with  managing household bills).  Ambulates in the home with walker and denies any actual falls in the last year.  Does report episodes of passing out, being caught by family before hitting floor when blood pressure is low or when hemoglobin low (like this recent GI bleed episode).  Husband typically transports to medical appointments.  Children helped since being discharged from hospital.  Husband concerned about patient being too weak to safely get up and down steps coming into home but states at this time he and children can manage and they have used wheelchair at provider office.  Discussed [facility] Social Work referral for transportation options, but husband declines at this time.  DME in the home include:  Standard walker, Rolator walker, bilateral hearing aids, tub transfer bench, grab bars in the shower, CBG meter (not diabetic), eyeglasses, blood pressure cuff, scale and bedside commode.  Conditions:  Per chart review and discussion with family, PMH include but not limited to:  Right breast cancer, GERD, hyperlipidemia, hypertension, bilateral knee replacements, and bilateral cataract extractions.  Husband reports patient has been newly diagnosed with cirrhosis of the liver.  She continues to receive chemotherapy for her breast cancer, but they are wishing to speak with oncologist about discontinuing chemo at this time and just monitoring patient.  He and family feel the chemo may be causing the liver failure.  Reports recent hospitalization related to GI bleed due to varices and discharged home on 10/13/2020 with home health orders.  Prior to hospital admission, husband reports patient was independent with care and now she does need assistance. Husband is anxious to start home therapies to help patient regain her strength.  Reports patient was confused initially,  but since taking prescribed Lactulose, mentation is back to baseline today.  Monitors blood pressures daily with ranges of 120/60's.  Does report some  lower extremity edema, but states she just recently took her prescribed fluid medication.  Medications:  Reports taking about 6 medications daily.  Husband states he assist patient with managing medications without difficulties.  Reports primary care refilled Lactulose prescription yesterday.  Although prescription for Xifaxan was prior authorized, still cost $700 for which they are unable to afford.  He has contact Gastroenterology for other options.  Discuss with husband Gertie Fey has mailed patient assistance forms for medication and to contact their office when he receives forms.   Encounter Medications:  Outpatient Encounter Medications as of 10/24/2020  Medication Sig Note  . acetaminophen (TYLENOL) 325 MG tablet Take 650 mg by mouth every 6 (six) hours as needed for mild pain.   Marland Kitchen aspirin EC 81 MG tablet Take 1 tablet (81 mg total) by mouth daily. Swallow whole. 10/24/2020: Reports taking 1/4 tablet daily  . carbamide peroxide (DEBROX) 6.5 % OTIC solution Place 5 drops into both ears 2 (two) times daily. 10/24/2020: Reports taking as needed  . furosemide (LASIX) 20 MG tablet Take 1 tablet (20 mg total) by mouth daily. (Patient taking differently: Take 20 mg by mouth every other day.)   . lactulose (CHRONULAC) 10 GM/15ML solution Take 30 mLs (20 g total) by mouth 2 (two) times daily.   . Multiple Vitamin (MULTIVITAMIN WITH MINERALS) TABS tablet Take 1 tablet by mouth daily.   . nadolol (CORGARD) 20 MG tablet Take 1 tablet (20 mg total) by mouth daily.   . pantoprazole (PROTONIX) 40 MG tablet Take 1 tablet (40 mg total) by mouth 2 (two) times daily before a meal.   . hydrocortisone 1 % ointment Apply 1 application topically 2 (two) times daily. (Patient not taking: No sig reported)   . lidocaine-prilocaine (EMLA) cream Apply to port site one hour prior to appointment and cover with plastic wrap. (Patient not taking: No sig reported)   . Misc. Devices MISC Please provide lymphedema sleeve for  right arm   . rifaximin (XIFAXAN) 550 MG TABS tablet Take 1 tablet (550 mg total) by mouth 2 (two) times daily. (Patient not taking: Reported on 10/24/2020) 10/24/2020: Reports can not afford   Facility-Administered Encounter Medications as of 10/24/2020  Medication  . sodium chloride flush (NS) 0.9 % injection 10 mL  . sodium chloride flush (NS) 0.9 % injection 10 mL    Functional Status:  In your present state of health, do you have any difficulty performing the following activities: 10/07/2020 10/07/2020  Hearing? - N  Vision? - N  Difficulty concentrating or making decisions? - Y  Walking or climbing stairs? - Y  Dressing or bathing? - N  Doing errands, shopping? N -  Some recent data might be hidden    Fall/Depression Screening: Fall Risk  10/24/2020  Falls in the past year? 0  Number falls in past yr: 0  Injury with Fall? 0  Risk for fall due to : Impaired balance/gait;Impaired mobility;Medication side effect  Follow up Falls evaluation completed;Education provided;Falls prevention discussed   PHQ 2/9 Scores 10/24/2020  PHQ - 2 Score 0    Goals Addressed            This Visit's Progress   . [facility]) Find Help in My Community       Timeframe:  Short-Term Goal Priority:  High Start Date:  10/24/20  Expected End Date:  11/27/20                     Follow Up Date 10/31/20   - begin a notebook of services in my neighborhood or community - follow-up on any referrals for help I am given - think ahead to make sure my need does not become an emergency - make a list of family or friends that I can call  -Use [facility] Calendar Booklet to assist with keeping track of appointments and numbers for agencies -Verify with [facility] when to expect services to start (home health RN/PTST/SW)   Why is this important?    Knowing how and where to find help for yourself or family in your neighborhood and community is an important skill.   You will want  to take some steps to learn how.    Notes:     . [facility]) Increase My Muscle Strength       Timeframe:  Long-Range Goal Priority:  High Start Date:  10/24/20                           Expected End Date:  02/24/21                     Follow Up Date 10/31/20   - do the exercise that the therapist taught me - set a daily activity goal - start slowly and increase the amount of time every week  -participate with home health therapy once initiated   Why is this important?    Walking and easy exercises make you stronger. These also give you energy.   Every little bit helps, at any age.    Notes:     . [facility]) Learn More About My Health       Timeframe:  Short-Term Goal Priority:  Medium Start Date:  10/24/20                           Expected End Date:  11/27/20                      Follow Up Date 10/31/20    - tell my story and reason for my visit - make a list of questions - ask questions - repeat what I heard to make sure I understand - bring a list of my medicines to the visit - speak up when I don't understand    Why is this important?    The best way to learn about your health and care is by talking to the doctor and nurse.   They will answer your questions and give you information in the way that you like best.    Notes:     . [facility] Manage My Medicine       Timeframe:  Long-Range Goal Priority:  High Start Date:   10/24/20                          Expected End Date:   02/24/21                    Follow Up Date 10/31/20    - keep a list of all the medicines I take; vitamins and herbals too - use a pillbox to sort medicine  -complete patient assistance forms for Xifaxan -Discuss  with GI alternate medication for Xifaxan or if they have samples available for patient   Why is this important?   . These steps will help you keep on track with your medicines.   Notes:       Appointments:  Attended appointment with primary care provider, Dr. Margo Aye on 10/23/2020.  [facility] has seen patient on 10/16/20.  Has scheduled appointment with Gastroenterology on 11/22/20.  Continues to await for [facility] to verify when their services with start.  Advanced Directives:  Reports having Living Will and Health Care Power of Attorney in place and does not wish to make changes.  Does have MOST form on chart and husband states he has completed form as well.  Discussed with husband the need to display form on fridge or above bed.   Consent:  [facility] services reviewed and discussed.  Husband and patient verbally agree to services.  Plan: Follow-up:  Patient agrees to Care Plan and Follow-up.  RN Care Coordinator will send primary MD barriers letter. RN Care Coordinator will route initial telephone assessment note to primary MD. RN Care Coordinator will send patient [facility] Welcome Packet. RN Care Coordinator will send patient [facility] 2022 Calendar Booklet. RN Care Coordinator will send patient Education on Cirrhosis. RN Care Coordinator will send patient Education on Esophageal Varices. RN Care Coordinator will send patient Education on Ascites. RN Care Coordinator will make next telephone outreach to husband within the next week and patient husband to future outreach.  [name] RN [facility] Care Management  RN Telephonic Care Coordinator [phone] [email or web address]

## 2020-10-24 NOTE — Patient Instructions (Addendum)
Goals Addressed            This Visit's Progress   . [facility]) Find Help in My Community       Timeframe:  Short-Term Goal Priority:  High Start Date:  10/24/20                           Expected End Date:  11/27/20                     Follow Up Date 10/31/20   - begin a notebook of services in my neighborhood or community - follow-up on any referrals for help I am given - think ahead to make sure my need does not become an emergency - make a list of family or friends that I can call  -Use [facility] Calendar Booklet to assist with keeping track of appointments and numbers for agencies -Verify with [facility] when to expect services to start (home health RN/PTST/SW)   Why is this important?    Knowing how and where to find help for yourself or family in your neighborhood and community is an important skill.   You will want to take some steps to learn how.    Notes:     . [facility]) Increase My Muscle Strength       Timeframe:  Long-Range Goal Priority:  High Start Date:  10/24/20                           Expected End Date:  02/24/21                     Follow Up Date 10/31/20   - do the exercise that the therapist taught me - set a daily activity goal - start slowly and increase the amount of time every week  -participate with home health therapy once initiated   Why is this important?    Walking and easy exercises make you stronger. These also give you energy.   Every little bit helps, at any age.    Notes:     . [facility]) Learn More About My Health       Timeframe:  Short-Term Goal Priority:  Medium Start Date:  10/24/20                           Expected End Date:  11/27/20                      Follow Up Date 10/31/20    - tell my story and reason for my visit - make a list of questions - ask questions - repeat what I heard to make sure I understand - bring a list of my medicines to the visit - speak up when I don't understand    Why is this important?    The best  way to learn about your health and care is by talking to the doctor and nurse.   They will answer your questions and give you information in the way that you like best.    Notes:     . [facility]) Manage My Medicine       Timeframe:  Long-Range Goal Priority:  High Start Date:   10/24/20  Expected End Date:   02/24/21                    Follow Up Date 10/31/20    - keep a list of all the medicines I take; vitamins and herbals too - use a pillbox to sort medicine  -complete patient assistance forms for Xifaxan -Discuss with GI alternate medication for Xifaxan or if they have samples available for patient   Why is this important?   . These steps will help you keep on track with your medicines.   Notes:       Cirrhosis  Cirrhosis is long-term (chronic) liver injury. The liver is the body's largest internal organ, and it performs many functions. It converts food into energy, removes toxic material from the blood, makes important proteins, and absorbs necessary vitamins from food. In cirrhosis, healthy liver cells are replaced by scar tissue. This prevents blood from flowing through the liver, making it difficult for the liver to function. Scarring of the liver cannot be reversed, but treatment can prevent it from getting worse. What are the causes? Common causes of this condition are hepatitis C and long-term alcohol abuse. Other causes include:  Nonalcoholic fatty liver disease. This happens when fat is deposited in the liver by causes other than alcohol.  Hepatitis B infection.  Autoimmune hepatitis. In this condition, the body's defense system (immune system) mistakenly attacks the liver cells, causing irritation and swelling (inflammation).  Diseases that cause blockage of ducts inside the liver.  Inherited liver diseases, such as hemochromatosis. This is one of the most common inherited liver diseases. In this disease, deposits of iron collect in the liver and  other organs.  Reactions to certain long-term medicines, such as amiodarone, a heart medicine.  Parasitic infections. These include schistosomiasis, which is caused by a flatworm.  Long-term contact to certain toxins. These toxins include certain organic solvents, such as toluene and chloroform. What increases the risk? You are more likely to develop this condition if:  You have certain types of viral hepatitis.  You abuse alcohol, especially if you are female.  You are overweight.  You share needles.  You have unprotected sex with someone who has viral hepatitis. What are the signs or symptoms? You may not have any signs and symptoms at first. Symptoms may not develop until the damage to your liver starts to get worse. Early symptoms may include:  Weakness and tiredness (fatigue).  Changes in sleep patterns or having trouble sleeping.  Itchiness.  Tenderness in the right-upper part of your abdomen.  Weight loss and muscle loss.  Nausea.  Loss of appetite.  Appearance of tiny blood vessels under the skin. Later symptoms may include:  Fatigue or weakness that is getting worse.  Yellow skin and eyes (jaundice).  Buildup of fluid in the abdomen (ascites). You may notice that your clothes are tight around your waist.  Weight gain.  Swelling of the feet and ankles (edema).  Trouble breathing.  Easy bruising and bleeding.  Vomiting blood.  Black or bloody stool.  Mental confusion. How is this diagnosed? Your health care provider may suspect cirrhosis based on your symptoms and medical history, especially if you have other medical conditions or a history of alcohol abuse. Your health care provider will do a physical exam to feel your liver and to check for signs of cirrhosis. He or she may perform other tests, including:  Blood tests to check: ? For hepatitis B or C. ? Kidney  function. ? Liver function.  Imaging tests such as: ? MRI or CT scan to look for  changes seen in advanced cirrhosis. ? Ultrasound to see if normal liver tissue is being replaced by scar tissue.  A procedure in which a long needle is used to take a sample of liver tissue to be checked in a lab (biopsy). Liver biopsy can confirm the diagnosis of cirrhosis. How is this treated? Treatment for this condition depends on how damaged your liver is and what caused the damage. It may include treating the symptoms of cirrhosis, or treating the underlying causes in order to slow the damage. Treatment may include:  Making lifestyle changes, such as: ? Eating a healthy diet. You may need to work with your health care provider or a diet and nutrition specialist (dietitian) to develop an eating plan. ? Restricting salt intake. ? Maintaining a healthy weight. ? Not abusing drugs or alcohol.  Taking medicines to: ? Treat liver infections or other infections. ? Control itching. ? Reduce fluid buildup. ? Reduce certain blood toxins. ? Reduce risk of bleeding from enlarged blood vessels in the stomach or esophagus (varices).  Liver transplant. In this procedure, a liver from a donor is used to replace your diseased liver. This is done if cirrhosis has caused liver failure. Other treatments and procedures may be done depending on the problems that you get from cirrhosis. Common problems include liver-related kidney failure (hepatorenal syndrome). Follow these instructions at home:   Take medicines only as told by your health care provider. Do not use medicines that are toxic to your liver. Ask your health care provider before taking any new medicines, including over-the-counter medicines.  Rest as needed.  Eat a well-balanced diet. Ask your health care provider or dietitian for more information.  Limit your salt or water intake, if your health care provider asks you to do this.  Do not drink alcohol. This is especially important if you are taking acetaminophen.  Keep all follow-up  visits as told by your health care provider. This is important. Contact a health care provider if you:  Have fatigue or weakness that is getting worse.  Develop swelling of the hands, feet, legs, or face.  Have a fever.  Develop loss of appetite.  Have nausea or vomiting.  Develop jaundice.  Develop easy bruising or bleeding. Get help right away if you:  Vomit bright red blood or a material that looks like coffee grounds.  Have blood in your stools.  Notice that your stools appear black and tarry.  Become confused.  Have chest pain or trouble breathing. Summary  Cirrhosis is chronic liver injury. Liver damage cannot be reversed. Common causes are hepatitis C and long-term alcohol abuse.  Tests used to diagnose cirrhosis include blood tests, imaging tests, and liver biopsy.  Treatment for this condition involves treating the underlying cause. Avoid alcohol, drugs, salt, and medicines that may damage your liver.  Contact your health care provider if you develop ascites, edema, jaundice, fever, nausea or vomiting, easy bruising or bleeding, or worsening fatigue. This information is not intended to replace advice given to you by your health care provider. Make sure you discuss any questions you have with your health care provider. Document Revised: 02/03/2019 Document Reviewed: 09/03/2017 Elsevier Patient Education  2020 Elsevier Inc[employer]  Ascites  Ascites is a collection of too much fluid in the abdomen. Ascites can range from mild to severe. If ascites is not treated, it can get worse. What are  the causes? This condition may be caused by:  A liver condition called cirrhosis. This is the most common cause of ascites.  Long-term (chronic) or alcoholic hepatitis.  Infection or inflammation in the abdomen.  Cancer in the abdomen.  Heart failure.  Kidney disease.  Inflammation of the pancreas.  Clots in the veins of the liver. What are the signs or  symptoms? Symptoms of this condition include:  A feeling of fullness in the abdomen. This is common.  An increase in the size of the abdomen or waist.  Swelling in the legs.  Swelling of the scrotum (in men).  Difficulty breathing.  Pain in the abdomen.  Sudden weight gain. If the condition is mild, you may not have symptoms. How is this diagnosed? This condition is diagnosed based on your medical history and a physical exam. Your health care provider may order imaging tests, such as an ultrasound or CT scan of your abdomen. How is this treated? Treatment for this condition depends on the cause of the ascites. It may include:  Taking a pill to make you urinate. This is called a water pill (diuretic pill).  Strictly reducing your salt (sodium) intake. Salt can cause extra fluid to be kept (retained) in the body, and this makes ascites worse.  Having a procedure to remove fluid from your abdomen (paracentesis).  Having a procedure that connects two of the major veins within your liver and relieves pressure on your liver. This is called a TIPS procedure (transjugular intrahepatic portosystemic shunt procedure).  Placement of a drainage catheter (peritoneovenous shunt) to manage the extra fluid in the abdomen. Ascites may go away or improve when the condition that caused it is treated. Follow these instructions at home:  Keep track of your weight. To do this, weigh yourself at the same time every day and write down your weight.  Keep track of how much you drink and any changes in how much or how often you urinate.  Follow any instructions that your health care provider gives you about how much to drink.  Try not to eat salty (high-sodium) foods.  Take over-the-counter and prescription medicines only as told by your health care provider.  Keep all follow-up visits as told by your health care provider. This is important.  Report any changes in your health to your health care  provider, especially if you develop new symptoms or your symptoms get worse. Contact a health care provider if:  You gain more than 3 lb (1.36 kg) in 3 days.  Your waist size increases.  You have new swelling in your legs.  The swelling in your legs gets worse. Get help right away if:  You have a fever.  You are confused.  You have new or worsening breathing trouble.  You have new or worsening pain in your abdomen.  You have new or worsening swelling in the scrotum (in men). Summary  Ascites is a collection of too much fluid in the abdomen.  Ascites may be caused by various conditions, such as cirrhosis, hepatitis, cancer, or congestive heart failure.  Symptoms may include swelling of the abdomen and other areas due to extra fluid in the body.  Treatments may involve dietary changes, medicines, or procedures. This information is not intended to replace advice given to you by your health care provider. Make sure you discuss any questions you have with your health care provider. Document Revised: 09/15/2018 Document Reviewed: 06/26/2017 Elsevier Patient Education  2020 Elsevier Inc.  Esophageal Varices  Esophageal varices are enlarged veins in the part of the body that moves food from the mouth to the stomach (esophagus). They develop when extra blood is forced to flow through these veins because the blood's normal pathway is blocked. Without treatment, esophageal varices eventually break and bleed (hemorrhage), which can be life-threatening. What are the causes? This condition may be caused by:  Scarring of the liver (cirrhosis) due to alcoholism. This is the most common cause.  Long-term (chronic) liver disease.  Severe heart failure.  A blood clot in a vein that supplies the liver (portal vein).  A disease that causes inflammation in the organs and other body areas (sarcoidosis).  A parasitic infection that can cause liver damage (schistosomiasis). What are the  signs or symptoms? Esophageal varices usually do not cause symptoms unless they start to bleed. Symptoms of bleeding esophageal varices include:  Vomiting material that is bright red or that is black and looks like coffee grounds.  Coughing up blood.  Stools (feces) that look black and tarry.  Dizziness or light-headedness.  Low blood pressure.  Loss of consciousness. How is this diagnosed? This condition is diagnosed with a procedure called endoscopy. During endoscopy, your health care provider uses a flexible tube with a small camera on the end of it (endoscope) to look down your throat and examine your esophagus. You may also have other tests, including:  Imaging tests such as a CT scan or ultrasound.  Blood tests. How is this treated? This condition may be treated with medicines or procedures that reduce pressure in the varices and reduce the risk of bleeding. Medicines are usually used for varices that are not bleeding. Procedures that may be done for bleeding varices include:  Placing an elastic band around the varices to keep them from bleeding (variceal ligation).  Replacing blood that you have lost due to bleeding. This may include getting a transfusion of blood or parts of blood, such as platelets or clotting factors.  You may be given antibiotic medicine to help prevent infection.  Getting an injection that causes the varices to shrink and close (sclerotherapy). You may also be given medicines that tighten (constrict) blood vessels or change blood flow.  Placing a tube into your esophagus and then passing a balloon through the tube and inflating the balloon (balloon tamponade). The balloon applies pressure to the bleeding veins to help stop the bleeding.  Placing a small tube within the veins in the liver (transjugular intrahepatic portosystemic shunt, TIPS). This decreases blood flow and pressure in the esophageal varices. If other treatments do not work, you may need a  liver transplant. Follow these instructions at home:  Take over-the-counter and prescription medicines only as told by your health care provider.  If you were prescribed an antibiotic medicine, take it as told by your health care provider. Do not stop taking the antibiotic even if you start to feel better.  Do not take any NSAIDs (such as aspirin or ibuprofen) before first getting approval from your health care provider.  Do not drink alcohol.  Return to your normal activities as told by your health care provider. Ask your health care provider what activities are safe for you.  Keep all follow-up visits as told by your health care provider. This is important. Contact a health care provider if:  You have abdominal pain.  You are unable to eat or drink. Get help right away if:  You have blood in your stool or vomit.  You have stools  that look black or tarry.  You have chest pain.  You feel dizzy or have low blood pressure.  You lose consciousness. These symptoms may represent a serious problem that is an emergency. Do not wait to see if the symptoms will go away. Get medical help right away. Call your local emergency services (911 in the [place] Do not drive yourself to the hospital. Summary  Esophageal varices are enlarged veins in the esophagus, the part of your body that moves food from your mouth to your stomach.  Without treatment, esophageal varices eventually break and bleed (hemorrhage), which can be life-threatening.  Esophageal varices usually do not cause symptoms unless they start to bleed.  Keep all follow-up visits as told by your health care provider. This is important. This information is not intended to replace advice given to you by your health care provider. Make sure you discuss any questions you have with your health care provider. Document Revised: 09/26/2017 Document Reviewed: 07/16/2017 Elsevier Patient Education  2020 [employer].

## 2020-10-25 ENCOUNTER — Telehealth: Payer: Self-pay | Admitting: Internal Medicine

## 2020-10-25 NOTE — Telephone Encounter (Signed)
Pt's husband, Ron, was calling for Dr [employer] nurse. [phone]

## 2020-10-25 NOTE — Telephone Encounter (Signed)
Spoke with pts spouse. He was notified of Vladimir Faster PA recommendations of continuing Lactulose so pt can have 3-4 soft BMS daily. Spouse is aware that pt assistance forms were mailed to him. If he doesn't receive them, he was asked to call back.

## 2020-10-25 NOTE — Telephone Encounter (Signed)
Lmom, waiting on a return call.  

## 2020-10-26 ENCOUNTER — Inpatient Hospital Stay (HOSPITAL_COMMUNITY): Payer: Medicare HMO

## 2020-10-26 ENCOUNTER — Other Ambulatory Visit: Payer: Self-pay

## 2020-10-26 ENCOUNTER — Inpatient Hospital Stay (HOSPITAL_BASED_OUTPATIENT_CLINIC_OR_DEPARTMENT_OTHER): Payer: Medicare HMO | Admitting: Hematology

## 2020-10-26 ENCOUNTER — Encounter (HOSPITAL_COMMUNITY): Payer: Self-pay | Admitting: Hematology

## 2020-10-26 VITALS — BP 148/67 | HR 73 | Temp 97.1°F | Resp 20 | Wt 174.1 lb

## 2020-10-26 DIAGNOSIS — C50411 Malignant neoplasm of upper-outer quadrant of right female breast: Secondary | ICD-10-CM

## 2020-10-26 DIAGNOSIS — Z171 Estrogen receptor negative status [ER-]: Secondary | ICD-10-CM | POA: Diagnosis not present

## 2020-10-26 DIAGNOSIS — C792 Secondary malignant neoplasm of skin: Secondary | ICD-10-CM | POA: Diagnosis not present

## 2020-10-26 DIAGNOSIS — N189 Chronic kidney disease, unspecified: Secondary | ICD-10-CM | POA: Diagnosis not present

## 2020-10-26 DIAGNOSIS — C50412 Malignant neoplasm of upper-outer quadrant of left female breast: Secondary | ICD-10-CM | POA: Diagnosis present

## 2020-10-26 DIAGNOSIS — I129 Hypertensive chronic kidney disease with stage 1 through stage 4 chronic kidney disease, or unspecified chronic kidney disease: Secondary | ICD-10-CM | POA: Diagnosis not present

## 2020-10-26 DIAGNOSIS — R948 Abnormal results of function studies of other organs and systems: Secondary | ICD-10-CM | POA: Diagnosis not present

## 2020-10-26 DIAGNOSIS — K7460 Unspecified cirrhosis of liver: Secondary | ICD-10-CM | POA: Diagnosis not present

## 2020-10-26 DIAGNOSIS — R188 Other ascites: Secondary | ICD-10-CM | POA: Diagnosis not present

## 2020-10-26 DIAGNOSIS — D509 Iron deficiency anemia, unspecified: Secondary | ICD-10-CM | POA: Diagnosis not present

## 2020-10-26 LAB — CBC WITH DIFFERENTIAL/PLATELET
Abs Immature Granulocytes: 0.01 10*3/uL (ref 0.00–0.07)
Basophils Absolute: 0.1 10*3/uL (ref 0.0–0.1)
Basophils Relative: 1 %
Eosinophils Absolute: 0.5 10*3/uL (ref 0.0–0.5)
Eosinophils Relative: 7 %
HCT: 28.8 % — ABNORMAL LOW (ref 36.0–46.0)
Hemoglobin: 9.2 g/dL — ABNORMAL LOW (ref 12.0–15.0)
Immature Granulocytes: 0 %
Lymphocytes Relative: 34 %
Lymphs Abs: 2.3 10*3/uL (ref 0.7–4.0)
MCH: 32.1 pg (ref 26.0–34.0)
MCHC: 31.9 g/dL (ref 30.0–36.0)
MCV: 100.3 fL — ABNORMAL HIGH (ref 80.0–100.0)
Monocytes Absolute: 1.4 10*3/uL — ABNORMAL HIGH (ref 0.1–1.0)
Monocytes Relative: 21 %
Neutro Abs: 2.5 10*3/uL (ref 1.7–7.7)
Neutrophils Relative %: 37 %
Platelets: 163 10*3/uL (ref 150–400)
RBC: 2.87 MIL/uL — ABNORMAL LOW (ref 3.87–5.11)
RDW: 18.6 % — ABNORMAL HIGH (ref 11.5–15.5)
WBC: 6.8 10*3/uL (ref 4.0–10.5)
nRBC: 0 % (ref 0.0–0.2)

## 2020-10-26 LAB — COMPREHENSIVE METABOLIC PANEL
ALT: 25 U/L (ref 0–44)
AST: 51 U/L — ABNORMAL HIGH (ref 15–41)
Albumin: 2.6 g/dL — ABNORMAL LOW (ref 3.5–5.0)
Alkaline Phosphatase: 136 U/L — ABNORMAL HIGH (ref 38–126)
Anion gap: 7 (ref 5–15)
BUN: 21 mg/dL (ref 8–23)
CO2: 22 mmol/L (ref 22–32)
Calcium: 8.6 mg/dL — ABNORMAL LOW (ref 8.9–10.3)
Chloride: 107 mmol/L (ref 98–111)
Creatinine, Ser: 1.24 mg/dL — ABNORMAL HIGH (ref 0.44–1.00)
GFR, Estimated: 42 mL/min — ABNORMAL LOW (ref >60)
Glucose, Bld: 104 mg/dL — ABNORMAL HIGH (ref 70–99)
Potassium: 3.8 mmol/L (ref 3.5–5.1)
Sodium: 136 mmol/L (ref 135–145)
Total Bilirubin: 1 mg/dL (ref 0.3–1.2)
Total Protein: 5.7 g/dL — ABNORMAL LOW (ref 6.5–8.1)

## 2020-10-26 LAB — MAGNESIUM: Magnesium: 1.9 mg/dL (ref 1.7–2.4)

## 2020-10-26 NOTE — Patient Instructions (Signed)
[facility] at [facility] Discharge Instructions  You were seen today by Dr. Ellin Saba. He went over your recent results. Your chemo treatments will be stopped. You will be scheduled for a PET scan prior to your next visit to determine the stage of your cancer. Dr. Ellin Saba will see you back in 3 weeks for follow up.   Thank you for choosing [facility] at [facility] to provide your oncology and hematology care.  To afford each patient quality time with our provider, please arrive at least 15 minutes before your scheduled appointment time.   If you have a lab appointment with the Cancer Center please come in thru the Main Entrance and check in at the main information desk  You need to re-schedule your appointment should you arrive 10 or more minutes late.  We strive to give you quality time with our providers, and arriving late affects you and other patients whose appointments are after yours.  Also, if you no show three or more times for appointments you may be dismissed from the clinic at the providers discretion.     Again, thank you for choosing [facility].  Our hope is that these requests will decrease the amount of time that you wait before being seen by our physicians.       _____________________________________________________________  Should you have questions after your visit to [facility], please contact our office at [phone] between the hours of 8:00 a.m. and 4:30 p.m.  Voicemails left after 4:00 p.m. will not be returned until the following business day.  For prescription refill requests, have your pharmacy contact our office and allow 72 hours.    Cancer Center Support Programs:   > Cancer Support Group  2nd Tuesday of the month 1pm-2pm, Journey Room

## 2020-10-26 NOTE — Progress Notes (Signed)
[name] presents today for [employer]. Upon obtaining her lab work from her port, she tells me that she does not want treatment today. She states her lower extremity swelling has been worse and she just doesn't feel like getting the infusion today. Husband in room and agreeable. Pt to see Dr. Ellin Saba. Lab work drawn and port deaccessed per protocol per pt request.  Per Dr. Ellin Saba, we will be stopping all further treatment.   Pt discharged in satisfactory condition in company of husband.

## 2020-10-26 NOTE — Progress Notes (Signed)
[facility] [address], [place] [address]   CLINIC:  Medical Oncology/Hematology  PCP:  [name], MD [address] [name] [address] [phone]   REASON FOR VISIT:  Follow-up for HER-2 positive right breast cancer to skin  PRIOR THERAPY:  1. Herceptin and Taxol x 12 cycles from 07/31/2018 to 10/30/2018. 2. Kadcylax 17 cyclesfrom 02/15/2019 to 04/06/2020.  NGS Results: Not done  CURRENT THERAPY: Enhertu and Aloxi every 3 weeks  BRIEF ONCOLOGIC HISTORY:  Oncology History  Malignant neoplasm of upper-outer quadrant of right breast in female, estrogen receptor negative ([place])  07/03/2018 Initial Diagnosis   Palpable right breast mass for several months with skin discoloration, clinically skin trabecular thickening with nipple retraction measuring 7.2 x 4.3 x 4.2 cm, additional mass 4 o'clock position 2.6 cm, right axillary tail 1.2 cm right axillary lymph node 1.8 cm left breast irregular mass 1 cm, adjacent mass 0.9 cm together measuring 1.9 cm, no lymphadenopathy   07/03/2018 Pathology Results   Right breast biopsy: IDC grade 2, ER 0%, PR 0%, HER-2 positive, Ki-67 40%, lymph node biopsy negative, left breast biopsy complex sclerosing lesion    07/15/2018 Cancer Staging   Staging form: Breast, AJCC 8th Edition - Clinical: Stage IIB (cT3, cN0, cM0, G2, ER-, PR-, HER2+) - Signed by [name], MD on 07/15/2018   07/31/2018 - 10/30/2018 Chemotherapy   The patient had trastuzumab (HERCEPTIN) 336 mg in sodium chloride 0.9 % 250 mL chemo infusion, 4 mg/kg = 336 mg, Intravenous,  Once, 4 of 4 cycles Administration: 336 mg (07/31/2018), 168 mg (08/07/2018), 168 mg (08/28/2018), 168 mg (08/14/2018), 168 mg (08/21/2018), 168 mg (09/04/2018), 168 mg (09/11/2018), 168 mg (09/18/2018), 168 mg (10/02/2018), 168 mg (10/09/2018), 168 mg (10/16/2018), 168 mg (10/23/2018), 168 mg (10/30/2018) PACLitaxel (TAXOL) 78 mg in sodium chloride 0.9 % 250 mL chemo infusion  (</= 50m/m2), 40 mg/m2 = 78 mg (50 % of original dose 80 mg/m2), Intravenous,  Once, 4 of 4 cycles Dose modification: 40 mg/m2 (50 % of original dose 80 mg/m2, Cycle 1, Reason: Patient Age), 53.3333 mg/m2 (66.7 % of original dose 80 mg/m2, Cycle 1, Reason: Provider Judgment), 45 mg/m2 (original dose 80 mg/m2, Cycle 3, Reason: Provider Judgment) Administration: 78 mg (07/31/2018), 102 mg (08/07/2018), 102 mg (08/28/2018), 102 mg (08/14/2018), 102 mg (08/21/2018), 102 mg (09/04/2018), 102 mg (09/18/2018), 84 mg (10/02/2018), 84 mg (10/09/2018), 84 mg (10/16/2018), 84 mg (10/23/2018), 84 mg (10/30/2018)  for chemotherapy treatment.    11/13/2018 - 01/25/2019 Chemotherapy   The patient had trastuzumab (HERCEPTIN) 504 mg in sodium chloride 0.9 % 250 mL chemo infusion, 6 mg/kg = 504 mg (100 % of original dose 6 mg/kg), Intravenous,  Once, 4 of 5 cycles Dose modification: 6 mg/kg (original dose 6 mg/kg, Cycle 1, Reason: Other (see comments), Comment: pt receving it ) Administration: 504 mg (11/13/2018), 504 mg (12/04/2018), 504 mg (01/04/2019), 504 mg (01/25/2019)  for chemotherapy treatment.    02/15/2019 - 04/06/2020 Chemotherapy   The patient had ado-trastuzumab emtansine (KADCYLA) 240 mg in sodium chloride 0.9 % 250 mL chemo infusion, 3 mg/kg = 240 mg (100 % of original dose 3 mg/kg), Intravenous, Once, 17 of 18 cycles Dose modification: 3 mg/kg (original dose 3 mg/kg, Cycle 1, Reason: Provider Judgment), 3 mg/kg (original dose 3 mg/kg, Cycle 2, Reason: Provider Judgment), 2.4 mg/kg (original dose 3 mg/kg, Cycle 11, Reason: Other (see comments), Comment: bili upper limit of normal), 2.4 mg/kg (original dose 3  mg/kg, Cycle 12, Reason: Other (see comments), Comment: elevated bilirubin) Administration: 240 mg (02/15/2019), 240 mg (03/08/2019), 240 mg (05/10/2019), 240 mg (03/29/2019), 240 mg (04/19/2019), 240 mg (06/01/2019), 240 mg (06/22/2019), 240 mg (07/20/2019), 240 mg (08/10/2019), 240 mg (09/01/2019), 200 mg (09/22/2019), 180  mg (11/03/2019), 180 mg (12/09/2019), 180 mg (01/06/2020), 180 mg (02/03/2020), 180 mg (03/09/2020), 180 mg (04/06/2020)  for chemotherapy treatment.    05/15/2020 -  Chemotherapy   The patient had dexamethasone (DECADRON) 4 MG tablet, 8 mg, Oral, Daily, 1 of 1 cycle, Start date: --, End date: -- palonosetron (ALOXI) injection 0.25 mg, 0.25 mg, Intravenous,  Once, 7 of 9 cycles Administration: 0.25 mg (05/15/2020), 0.25 mg (06/05/2020), 0.25 mg (08/24/2020), 0.25 mg (09/14/2020), 0.25 mg (06/26/2020), 0.25 mg (07/17/2020), 0.25 mg (10/05/2020) fam-trastuzumab deruxtecan-nxki (ENHERTU) 226 mg in dextrose 5 % 100 mL chemo infusion, 3.2 mg/kg = 226 mg (59.3 % of original dose 5.4 mg/kg), Intravenous,  Once, 7 of 9 cycles Dose modification: 3.2 mg/kg (original dose 5.4 mg/kg, Cycle 1, Reason: Provider Judgment), 4.4 mg/kg (original dose 5.4 mg/kg, Cycle 2, Reason: Provider Judgment), 4.4 mg/kg (original dose 5.4 mg/kg, Cycle 5, Reason: Provider Judgment), 3.2 mg/kg (original dose 5.4 mg/kg, Cycle 5, Reason: Provider Judgment), 3.2 mg/kg (original dose 5.4 mg/kg, Cycle 6, Reason: Provider Judgment), 4.4 mg/kg (original dose 5.4 mg/kg, Cycle 3, Reason: Provider Judgment), 200 mg (original dose 5.4 mg/kg, Cycle 5, Reason: Other (see comments), Comment: all pharmacy has on hand) Administration: 226 mg (05/15/2020), 312 mg (06/05/2020), 226 mg (09/14/2020), 312 mg (06/26/2020), 312 mg (07/17/2020), 200 mg (08/24/2020), 226 mg (10/05/2020)  for chemotherapy treatment.      CANCER STAGING: Cancer Staging Malignant neoplasm of upper-outer quadrant of right breast in female, estrogen receptor negative ([place]) Staging form: Breast, AJCC 8th Edition - Clinical: Stage IIB (cT3, cN0, cM0, G2, ER-, PR-, HER2+) - Signed by [name], MD on 07/15/2018   INTERVAL HISTORY:  Ms. PERSIA LINTNER, a 86 y.o. female, returns for routine follow-up and consideration for next cycle of chemotherapy. Grethel was last seen on 09/14/2020.  Due for  cycle #8 of Enhertu and Aloxi today.   Today she is accompanied by her husband. Overall, she tells me she has been feeling okay. She went to [facility] on 12/11 after having 4 melanotic BM's and nearly fainted afterwards. She was also confused, but has improved since she started lactulose. Her legs have been swollen and she keeps them propped up.  She will stop treatments to see if her symptoms improve and if she wants to continue treatments.   REVIEW OF SYSTEMS:  Review of Systems  Constitutional: Positive for fatigue (50%). Negative for appetite change.  Cardiovascular: Positive for leg swelling.  Gastrointestinal: Positive for blood in stool (melanotic BM's post chemo).  All other systems reviewed and are negative.   PAST MEDICAL/SURGICAL HISTORY:  Past Medical History:  Diagnosis Date  . Cancer ([place]) 06/2018   right breast cancer  . GERD (gastroesophageal reflux disease)    occasional   . Hyperlipidemia   . Hypertension    clearance with note Dr Nevada Crane on chart  . Pneumonia    2 years ago/ states occ cough with sinus drainage- no fever  . Thyroid nodule    with biopsy- states following medically   Past Surgical History:  Procedure Laterality Date  . CATARACT EXTRACTION W/PHACO  11/30/2012   Procedure: CATARACT EXTRACTION PHACO AND INTRAOCULAR LENS PLACEMENT (IOC);  Surgeon: Williams Che, MD;  Location: [facility];  Service: Ophthalmology;  Laterality: Left;  CDE:11.49  . CATARACT EXTRACTION W/PHACO Right 03/15/2013   Procedure: CATARACT EXTRACTION PHACO AND INTRAOCULAR LENS PLACEMENT (IOC);  Surgeon: Williams Che, MD;  Location: [facility];  Service: Ophthalmology;  Laterality: Right;  CDE 16.15  . COLONOSCOPY    . ESOPHAGOGASTRODUODENOSCOPY (EGD) WITH PROPOFOL N/A 10/07/2020   Procedure: ESOPHAGOGASTRODUODENOSCOPY (EGD) WITH PROPOFOL;  Surgeon: Daneil Dolin, MD;  Location[facility];  Service: Endoscopy;  Laterality: N/A;  . ESOPHAGOGASTRODUODENOSCOPY (EGD) WITH PROPOFOL N/A  10/11/2020   Procedure: ESOPHAGOGASTRODUODENOSCOPY (EGD) WITH PROPOFOL;  Surgeon: Rogene Houston, MD;  [facility];  Service: Endoscopy;  Laterality: N/A;  . EXCISION OF SKIN TAG Right 06/17/2016   Procedure: SHAVE OF SKIN TAG RIGHT BUTTOCK;  Surgeon: Aviva Signs, MD;  Location: [facility];  Service: General;  Laterality: Right;  . JOINT REPLACEMENT     left knee  . MASS EXCISION Left 06/17/2016   Procedure: EXCISION SKIN MALIGNANT LESION LEFT BUTTOCK;  Surgeon: Aviva Signs, MD;  Location: [facility];  Service: General;  Laterality: Left;  . PORTACATH PLACEMENT Right 07/28/2018   Procedure: INSERTION PORT-A-CATH WITH ULTRASOUND;  Surgeon: Rolm Bookbinder, MD;  Location: [facility];  Service: General;  Laterality: Right;  . PUNCH BIOPSY OF SKIN Right 07/28/2018   Procedure: PUNCH BIOPSY OF SKIN RIGHT BREAST;  Surgeon: Rolm Bookbinder, MD;  Location: [facility];  Service: General;  Laterality: Right;  . TOTAL KNEE ARTHROPLASTY  12/16/2011   Procedure: TOTAL KNEE ARTHROPLASTY;  Surgeon: Gearlean Alf, MD;  Location: [facility];  Service: Orthopedics;  Laterality: Right;  . TUBAL LIGATION      SOCIAL HISTORY:  Social History   Socioeconomic History  . Marital status: Married    Spouse name: Not on file  . Number of children: 2  . Years of education: some business school after HS  . Highest education level: Not on file  Occupational History  . Occupation: Cabin crew  . Occupation: Network engineer   Tobacco Use  . Smoking status: Never Smoker  . Smokeless tobacco: Never Used  Vaping Use  . Vaping Use: Never used  Substance and Sexual Activity  . Alcohol use: No  . Drug use: No  . Sexual activity: Yes    Birth control/protection: None  Other Topics Concern  . Not on file  Social History Narrative   Lives at home with her husband.   4 cups caffeine per day.   Right-handed.   Social Determinants of Health   Financial Resource Strain: Low Risk   .  Difficulty of Paying Living Expenses: Not hard at all  Food Insecurity: No Food Insecurity  . Worried About Charity fundraiser in the Last Year: Never true  . Ran Out of Food in the Last Year: Never true  Transportation Needs: No Transportation Needs  . Lack of Transportation (Medical): No  . Lack of Transportation (Non-Medical): No  Physical Activity: Not on file  Stress: Not on file  Social Connections: Not on file  Intimate Partner Violence: Not At Risk  . Fear of Current or Ex-Partner: No  . Emotionally Abused: No  . Physically Abused: No  . Sexually Abused: No    FAMILY HISTORY:  Family History  Problem Relation Age of Onset  . Heart attack Mother   . Bronchitis Father   . Diabetes Sister   . Leukemia Brother   . Lung disease Brother   . Lung disease Sister  CURRENT MEDICATIONS:  Current Outpatient Medications  Medication Sig Dispense Refill  . acetaminophen (TYLENOL) 325 MG tablet Take 650 mg by mouth every 6 (six) hours as needed for mild pain.    [place] Kitchen aspirin EC 81 MG tablet Take 1 tablet (81 mg total) by mouth daily. Swallow whole. 30 tablet 11  . carbamide peroxide (DEBROX) 6.5 % OTIC solution Place 5 drops into both ears 2 (two) times daily. 15 mL 0  . furosemide (LASIX) 20 MG tablet Take 1 tablet (20 mg total) by mouth daily. (Patient taking differently: Take 20 mg by mouth every other day.) 30 tablet 3  . hydrocortisone 1 % ointment Apply 1 application topically 2 (two) times daily. 60 g 0  . lactulose (CHRONULAC) 10 GM/15ML solution Take 30 mLs (20 g total) by mouth 2 (two) times daily. 236 mL 0  . lidocaine-prilocaine (EMLA) cream Apply to port site one hour prior to appointment and cover with plastic wrap. 30 g 3  . Misc. Devices MISC Please provide lymphedema sleeve for right arm 2 each 0  . Multiple Vitamin (MULTIVITAMIN WITH MINERALS) TABS tablet Take 1 tablet by mouth daily.    . nadolol (CORGARD) 20 MG tablet Take 1 tablet (20 mg total) by mouth daily. 30  tablet 11  . pantoprazole (PROTONIX) 40 MG tablet Take 1 tablet (40 mg total) by mouth 2 (two) times daily before a meal. 60 tablet 11  . rifaximin (XIFAXAN) 550 MG TABS tablet Take 1 tablet (550 mg total) by mouth 2 (two) times daily. 42 tablet 11   No current facility-administered medications for this visit.   Facility-Administered Medications Ordered in Other Visits  Medication Dose Route Frequency Provider Last Rate Last Admin  . sodium chloride flush (NS) 0.9 % injection 10 mL  10 mL Intravenous PRN Derek Jack, MD   10 mL at 05/23/20 1025  . sodium chloride flush (NS) 0.9 % injection 10 mL  10 mL Intravenous PRN Derek Jack, MD   10 mL at 05/23/20 0900    ALLERGIES:  Allergies  Allergen Reactions  . Penicillins Rash    Has patient had a PCN reaction causing immediate rash, facial/tongue/throat swelling, SOB or lightheadedness with hypotension: No Has patient had a PCN reaction causing severe rash involving mucus membranes or skin necrosis: No Has patient had a PCN reaction that required hospitalization: No Has patient had a PCN reaction occurring within the last 10 years: No If all of the above answers are "NO", then may proceed with Cephalosporin use.     PHYSICAL EXAM:   Performance status (ECOG): 1 - Symptomatic but completely ambulatory  Vitals:   10/26/20 1225  BP: (!) 148/67  Pulse: 73  Resp: 20  Temp: (!) 97.1 F (36.2 C)  SpO2: 99%   Wt Readings from Last 3 Encounters:  10/26/20 174 lb 1.6 oz (79 kg)  10/13/20 189 lb 6 oz (85.9 kg)  10/05/20 174 lb 12.8 oz (79.3 kg)   Physical Exam Chest:     Comments: Port-a-Cath in R chest    LABORATORY DATA:  I have reviewed the labs as listed.  CBC Latest Ref Rng & Units 10/26/2020 10/13/2020 10/12/2020  WBC 4.0 - 10.5 K/uL 6.8 10.0 10.4  Hemoglobin 12.0 - 15.0 g/dL 9.2(L) 8.8(L) 9.4(L)  Hematocrit 36.0 - 46.0 % 28.8(L) 27.2(L) 28.3(L)  Platelets 150 - 400 K/uL 163 163 187   CMP Latest Ref Rng  & Units 10/26/2020 10/11/2020 10/10/2020  Glucose 70 - 99 mg/dL 104(H)  112(H) 122(H)  BUN 8 - 23 mg/dL 21 37(H) 50(H)  Creatinine 0.44 - 1.00 mg/dL 1.24(H) 1.52(H) 1.51(H)  Sodium 135 - 145 mmol/L 136 138 141  Potassium 3.5 - 5.1 mmol/L 3.8 3.8 3.3(L)  Chloride 98 - 111 mmol/L 107 111 113(H)  CO2 22 - 32 mmol/L 22 20(L) 21(L)  Calcium 8.9 - 10.3 mg/dL 8.6(L) 7.7(L) 8.0(L)  Total Protein 6.5 - 8.1 g/dL 5.7(L) 4.7(L) 4.8(L)  Total Bilirubin 0.3 - 1.2 mg/dL 1.0 1.4(H) 1.5(H)  Alkaline Phos 38 - 126 U/L 136(H) 76 77  AST 15 - 41 U/L 51(H) 43(H) 44(H)  ALT 0 - 44 U/L 25 23 23     DIAGNOSTIC IMAGING:  I have independently reviewed the scans and discussed with the patient. CT HEAD WO CONTRAST  Result Date: 10/07/2020 CLINICAL DATA:  Altered mental status. EXAM: CT HEAD WITHOUT CONTRAST TECHNIQUE: Contiguous axial images were obtained from the base of the skull through the vertex without intravenous contrast. COMPARISON:  None. FINDINGS: Brain: No evidence of acute infarction, hemorrhage, hydrocephalus, extra-axial collection or mass lesion/mass effect. Vascular: No hyperdense vessel or unexpected calcification. Skull: Normal. Negative for fracture or focal lesion. Sinuses/Orbits: No acute finding. Other: None. IMPRESSION: Normal head CT. Electronically Signed   By: Marijo Conception M.D.   On: 10/07/2020 15:34   [place] ASCITES (ABDOMEN LIMITED)  Result Date: 10/09/2020 CLINICAL DATA:  Ascites EXAM: LIMITED ABDOMEN ULTRASOUND FOR ASCITES TECHNIQUE: Limited ultrasound survey for ascites was performed in all four abdominal quadrants. COMPARISON:  CT abdomen and pelvis 08/03/2020 FINDINGS: Minimal ascites seen in the mid abdomen. No significant ascites is identified to permit paracentesis. IMPRESSION: Insufficient ascites for paracentesis. Electronically Signed   By: Lavonia Dana M.D.   On: 10/09/2020 15:23     ASSESSMENT:  1. Metastatic right breast cancer to the skin, ER/PR negative, HER-2  positive: -Presentation with skin involvement of the right breast, axillary region and posterior chest wall. -12 cycles of weekly Taxol and Herceptin from 07/31/2018 through 10/30/2018 with PET scan showing progression. -Kadcyla from 02/15/2019 through 04/06/2020 with progression. -CT CAP on 04/26/2020 shows right breast mass measuring 3.7 x 2.8 (2.7 x 2.3). CC dimension measures 4.3 cm (3.9 cm). No new areas of metastatic disease. No skeletal metastasis. -Enhertustarted on 05/15/2020 at 3.4 mg/kg. -CT CAP from 08/03/2020 shows no evidence of lymphadenopathy or metastatic disease in the chest, abdomen or pelvis. Cirrhotic morphology of the liver with moderate volume ascites throughout the abdomen and pelvis. Wall thickening of the cecal base.  2. High risk drug monitoring: -2D echo on 04/03/2020 shows EF 65 to 70%.   PLAN:  1. Metastatic right breast cancer to the skin, ER/PR negative, HER-2 positive: -She could not do CT CAP as she cannot drink oral contrast. -Because of her recent hospitalization and new cirrhosis, we have recommended to hold off on any further treatments.  The patient and her husband are agreeable. -I will schedule her for PET CT scan and follow-up after that.  2. High risk drug monitoring: -No clinical signs of CHF.  3. Elevated LFTs: -AST is 51, likely from cirrhosis.  4. CKD: -Creatinine today is 1.24.  5. Macrocytic anemia: -Anemia from iron deficiency, CKD, blood loss.  Hemoglobin today is 9.2.  6. New onset cirrhosis and ascites: -Admitted to the hospital from 10/06/2020 through12/17/2021 with GI bleeding. -Received 3 units of PRBC.  EGD showed grade 1 varices. -She is also being treated for encephalopathy with lactulose.   Orders placed this encounter:  Orders Placed This Encounter  Procedures  . NM PET Image Restag (PS) Skull Base To Thigh     Derek Jack, MD [place] [phone]   I, Milinda Antis, am  acting as a scribe for Dr. Sanda Linger.  I, Derek Jack MD, have reviewed the above documentation for accuracy and completeness, and I agree with the above.

## 2020-10-26 NOTE — Progress Notes (Signed)
Patient will not receive treatment today per Dr. Ellin Saba. Order placed for restaging PET scan per Dr. Ellin Saba

## 2020-10-31 ENCOUNTER — Other Ambulatory Visit: Payer: Self-pay | Admitting: *Deleted

## 2020-10-31 NOTE — Patient Outreach (Signed)
[facility]) Care Management  10/31/2020  [name] 1930/06/07 [id number]   RN Telephonic Care Coordinator Follow Up Home Health Outreach  Referral Date:  10/16/20 Referral Source:  EMMI General Discharge Red Alert Reason for Referral:  EMMI Red Alert Insurance:  [employer] Medicare   Outreach Attempt:  Successful telephone outreach to patient's husband.  Husband verified HIPAA.  Reports patient is doing well, just anxiously waiting to begin home therapy.  Husband states he has not heard anything from home health agency.  RN Care Coordinator outreached to [facility] [phone]) and was informed there is plans for scheduled home visit with home nursing and therapy on 11/01/2020.  Updated husband.  Husband also reports he paid out of pocket for patient's medication Xiafaxin and has the patient assistance forms he needs to complete.  Encouraged husband to complete forms as soon as possible and return.  Husband states no further needs or issues at this time.  Appointments:  Patient has scheduled Gastroenterology follow up appointment on 11/22/2020.  Plan:  RN Care Coordinator will make next telephone outreach to patient/husband within the next 14 business days and husband agrees to care plan and future outreach.   [name] RN [facility] Care Management  RN Telephonic Care Coordinator [phone] [name]

## 2020-10-31 NOTE — Patient Instructions (Signed)
Goals Addressed            This Visit's Progress   . [facility]) Find Help in My Community   On track    Timeframe:  Short-Term Goal Priority:  High Start Date:  10/24/20                           Expected End Date:  11/27/20                     Follow Up Date 11/14/20   - begin a notebook of services in my neighborhood or community - follow-up on any referrals for help I am given - think ahead to make sure my need does not become an emergency - make a list of family or friends that I can call  -Use [facility] Calendar Booklet to assist with keeping track of appointments and numbers for agencies -Verify with [facility] when to expect services to start (home health RN/PTST/SW)   Why is this important?    Knowing how and where to find help for yourself or family in your neighborhood and community is an important skill.   You will want to take some steps to learn how.    Notes:  10/31/20-RN Care Coordinator outreached to [facility] and verified they plan to make home visit (Nursing and Therapy) on 11/01/20; updated Husband and encouraged him to contact agency if visit not completed, provided husband with contact number for home health agency    . [facility]) Increase My Muscle Strength   On track    Timeframe:  Long-Range Goal Priority:  High Start Date:  10/24/20                           Expected End Date:  02/24/21                     Follow Up Date 11/14/20   - do the exercise that the therapist taught me - set a daily activity goal - start slowly and increase the amount of time every week  -participate with home health therapy once initiated   Why is this important?    Walking and easy exercises make you stronger. These also give you energy.   Every little bit helps, at any age.    Notes:  10/31/20-husband reports patient has been ambulating with walker to help strength and is anxious to start home therapy    . [facility]) Learn More About My Health   On track    Timeframe:   Short-Term Goal Priority:  Medium Start Date:  10/24/20                           Expected End Date:  11/27/20                      Follow Up Date 11/14/20    - tell my story and reason for my visit - make a list of questions - ask questions - repeat what I heard to make sure I understand - bring a list of my medicines to the visit - speak up when I don't understand    Why is this important?    The best way to learn about your health and care is by talking to the doctor and nurse.   They will  answer your questions and give you information in the way that you like best.    Notes:     . [facility]) Manage My Medicine   On track    Timeframe:  Long-Range Goal Priority:  High Start Date:   10/24/20                          Expected End Date:   02/24/21                    Follow Up Date 11/14/20    - keep a list of all the medicines I take; vitamins and herbals too - use a pillbox to sort medicine  -complete patient assistance forms for Xifaxan -Discuss with GI alternate medication for Xifaxan or if they have samples available for patient   Why is this important?   . These steps will help you keep on track with your medicines.   Notes:  10/31/20-husband reports paying out of pocket for medication (Xifaxan) and has patient assistance forms from office he will complete and return

## 2020-11-13 ENCOUNTER — Ambulatory Visit (HOSPITAL_COMMUNITY): Payer: Medicare HMO

## 2020-11-14 ENCOUNTER — Other Ambulatory Visit: Payer: Self-pay | Admitting: *Deleted

## 2020-11-14 NOTE — Patient Outreach (Signed)
[place] [facility]) Care Management  11/14/2020  [name] 1930/04/08 [id number]   Call placed to member/husband to follow up on member's ongoing hospital recovery.  No answer, HIPAA compliant voice message left.  Will follow up within the next 3-4 business days.  [name], [place], MSN [place] [phone]

## 2020-11-16 ENCOUNTER — Ambulatory Visit (HOSPITAL_COMMUNITY): Payer: Medicare HMO | Admitting: Hematology

## 2020-11-17 ENCOUNTER — Other Ambulatory Visit: Payer: Self-pay | Admitting: *Deleted

## 2020-11-17 DIAGNOSIS — K7460 Unspecified cirrhosis of liver: Secondary | ICD-10-CM | POA: Diagnosis not present

## 2020-11-17 DIAGNOSIS — C50411 Malignant neoplasm of upper-outer quadrant of right female breast: Secondary | ICD-10-CM | POA: Diagnosis not present

## 2020-11-17 DIAGNOSIS — J449 Chronic obstructive pulmonary disease, unspecified: Secondary | ICD-10-CM | POA: Diagnosis not present

## 2020-11-17 DIAGNOSIS — D630 Anemia in neoplastic disease: Secondary | ICD-10-CM | POA: Diagnosis not present

## 2020-11-17 DIAGNOSIS — E440 Moderate protein-calorie malnutrition: Secondary | ICD-10-CM | POA: Diagnosis not present

## 2020-11-17 DIAGNOSIS — I509 Heart failure, unspecified: Secondary | ICD-10-CM | POA: Diagnosis not present

## 2020-11-17 DIAGNOSIS — K7290 Hepatic failure, unspecified without coma: Secondary | ICD-10-CM | POA: Diagnosis not present

## 2020-11-17 DIAGNOSIS — I110 Hypertensive heart disease with heart failure: Secondary | ICD-10-CM | POA: Diagnosis not present

## 2020-11-17 DIAGNOSIS — K922 Gastrointestinal hemorrhage, unspecified: Secondary | ICD-10-CM | POA: Diagnosis not present

## 2020-11-17 DIAGNOSIS — E785 Hyperlipidemia, unspecified: Secondary | ICD-10-CM | POA: Diagnosis not present

## 2020-11-17 NOTE — Patient Outreach (Signed)
[facility]) Care Management  11/17/2020  [name] Aug 22, 1930 [id number]   Outreach attempt #2, unsuccessful, HIPAA compliant voice message left. Will send outreach letter and follow up within the next 3-4 business days.  [name], [place], MSN [place] [phone]

## 2020-11-22 ENCOUNTER — Ambulatory Visit: Payer: Medicare HMO | Admitting: Gastroenterology

## 2020-11-22 DIAGNOSIS — D630 Anemia in neoplastic disease: Secondary | ICD-10-CM | POA: Diagnosis not present

## 2020-11-22 DIAGNOSIS — E785 Hyperlipidemia, unspecified: Secondary | ICD-10-CM | POA: Diagnosis not present

## 2020-11-22 DIAGNOSIS — K7460 Unspecified cirrhosis of liver: Secondary | ICD-10-CM | POA: Diagnosis not present

## 2020-11-22 DIAGNOSIS — J449 Chronic obstructive pulmonary disease, unspecified: Secondary | ICD-10-CM | POA: Diagnosis not present

## 2020-11-22 DIAGNOSIS — K7290 Hepatic failure, unspecified without coma: Secondary | ICD-10-CM | POA: Diagnosis not present

## 2020-11-22 DIAGNOSIS — I509 Heart failure, unspecified: Secondary | ICD-10-CM | POA: Diagnosis not present

## 2020-11-22 DIAGNOSIS — C50411 Malignant neoplasm of upper-outer quadrant of right female breast: Secondary | ICD-10-CM | POA: Diagnosis not present

## 2020-11-22 DIAGNOSIS — I110 Hypertensive heart disease with heart failure: Secondary | ICD-10-CM | POA: Diagnosis not present

## 2020-11-22 DIAGNOSIS — K922 Gastrointestinal hemorrhage, unspecified: Secondary | ICD-10-CM | POA: Diagnosis not present

## 2020-11-22 DIAGNOSIS — E440 Moderate protein-calorie malnutrition: Secondary | ICD-10-CM | POA: Diagnosis not present

## 2020-11-24 ENCOUNTER — Other Ambulatory Visit: Payer: Self-pay | Admitting: *Deleted

## 2020-11-24 NOTE — Patient Outreach (Signed)
[place] [facility]) Care Management  11/24/2020  [name] [birth year] [id number]   Outreach attempt #3, unsuccessful, HIPAA compliant voice message left.  Will make 4th and final attempt within the next 4 weeks, if remain unsuccessful will close case due to inability to maintain contact.  [name], [place], MSN [employer] [phone]

## 2020-11-27 ENCOUNTER — Other Ambulatory Visit: Payer: Self-pay

## 2020-11-27 ENCOUNTER — Ambulatory Visit (HOSPITAL_COMMUNITY)
Admission: RE | Admit: 2020-11-27 | Discharge: 2020-11-27 | Disposition: A | Discharge status: Home / Self Care | Payer: Medicare HMO | Source: Ambulatory Visit | Attending: Hematology | Admitting: Hematology

## 2020-11-27 DIAGNOSIS — Z171 Estrogen receptor negative status [ER-]: Secondary | ICD-10-CM | POA: Insufficient documentation

## 2020-11-27 DIAGNOSIS — C50919 Malignant neoplasm of unspecified site of unspecified female breast: Secondary | ICD-10-CM | POA: Diagnosis not present

## 2020-11-27 DIAGNOSIS — C50411 Malignant neoplasm of upper-outer quadrant of right female breast: Secondary | ICD-10-CM

## 2020-11-27 MED ORDER — FLUDEOXYGLUCOSE F - 18 (FDG) INJECTION
10.3000 | Freq: Once | INTRAVENOUS | Status: AC | PRN
  Administered 2020-11-27: 10.3 via INTRAVENOUS

## 2020-11-28 DIAGNOSIS — D630 Anemia in neoplastic disease: Secondary | ICD-10-CM | POA: Diagnosis not present

## 2020-11-28 DIAGNOSIS — E785 Hyperlipidemia, unspecified: Secondary | ICD-10-CM | POA: Diagnosis not present

## 2020-11-28 DIAGNOSIS — J449 Chronic obstructive pulmonary disease, unspecified: Secondary | ICD-10-CM | POA: Diagnosis not present

## 2020-11-28 DIAGNOSIS — E440 Moderate protein-calorie malnutrition: Secondary | ICD-10-CM | POA: Diagnosis not present

## 2020-11-28 DIAGNOSIS — I110 Hypertensive heart disease with heart failure: Secondary | ICD-10-CM | POA: Diagnosis not present

## 2020-11-28 DIAGNOSIS — C50411 Malignant neoplasm of upper-outer quadrant of right female breast: Secondary | ICD-10-CM | POA: Diagnosis not present

## 2020-11-28 DIAGNOSIS — K7460 Unspecified cirrhosis of liver: Secondary | ICD-10-CM | POA: Diagnosis not present

## 2020-11-28 DIAGNOSIS — I509 Heart failure, unspecified: Secondary | ICD-10-CM | POA: Diagnosis not present

## 2020-11-28 DIAGNOSIS — K7290 Hepatic failure, unspecified without coma: Secondary | ICD-10-CM | POA: Diagnosis not present

## 2020-11-28 DIAGNOSIS — K922 Gastrointestinal hemorrhage, unspecified: Secondary | ICD-10-CM | POA: Diagnosis not present

## 2020-11-30 ENCOUNTER — Other Ambulatory Visit: Payer: Self-pay

## 2020-11-30 ENCOUNTER — Inpatient Hospital Stay (HOSPITAL_COMMUNITY): Payer: Medicare HMO

## 2020-11-30 ENCOUNTER — Encounter (HOSPITAL_COMMUNITY): Payer: Self-pay | Admitting: Hematology

## 2020-11-30 ENCOUNTER — Inpatient Hospital Stay (HOSPITAL_COMMUNITY): Payer: Medicare HMO | Attending: Hematology | Admitting: Hematology

## 2020-11-30 VITALS — BP 175/67 | HR 68 | Temp 97.1°F | Resp 18 | Wt 163.8 lb

## 2020-11-30 DIAGNOSIS — R410 Disorientation, unspecified: Secondary | ICD-10-CM | POA: Diagnosis not present

## 2020-11-30 DIAGNOSIS — K7460 Unspecified cirrhosis of liver: Secondary | ICD-10-CM | POA: Insufficient documentation

## 2020-11-30 DIAGNOSIS — C50411 Malignant neoplasm of upper-outer quadrant of right female breast: Secondary | ICD-10-CM

## 2020-11-30 DIAGNOSIS — Z7982 Long term (current) use of aspirin: Secondary | ICD-10-CM | POA: Insufficient documentation

## 2020-11-30 DIAGNOSIS — I129 Hypertensive chronic kidney disease with stage 1 through stage 4 chronic kidney disease, or unspecified chronic kidney disease: Secondary | ICD-10-CM | POA: Diagnosis not present

## 2020-11-30 DIAGNOSIS — R188 Other ascites: Secondary | ICD-10-CM | POA: Diagnosis not present

## 2020-11-30 DIAGNOSIS — D631 Anemia in chronic kidney disease: Secondary | ICD-10-CM | POA: Diagnosis not present

## 2020-11-30 DIAGNOSIS — Z79899 Other long term (current) drug therapy: Secondary | ICD-10-CM | POA: Insufficient documentation

## 2020-11-30 DIAGNOSIS — Z171 Estrogen receptor negative status [ER-]: Secondary | ICD-10-CM | POA: Diagnosis not present

## 2020-11-30 DIAGNOSIS — R5383 Other fatigue: Secondary | ICD-10-CM | POA: Insufficient documentation

## 2020-11-30 DIAGNOSIS — N189 Chronic kidney disease, unspecified: Secondary | ICD-10-CM | POA: Diagnosis not present

## 2020-11-30 DIAGNOSIS — Z9221 Personal history of antineoplastic chemotherapy: Secondary | ICD-10-CM | POA: Diagnosis not present

## 2020-11-30 DIAGNOSIS — R948 Abnormal results of function studies of other organs and systems: Secondary | ICD-10-CM | POA: Diagnosis not present

## 2020-11-30 LAB — COMPREHENSIVE METABOLIC PANEL
ALT: 35 U/L (ref 0–44)
AST: 76 U/L — ABNORMAL HIGH (ref 15–41)
Albumin: 3 g/dL — ABNORMAL LOW (ref 3.5–5.0)
Alkaline Phosphatase: 166 U/L — ABNORMAL HIGH (ref 38–126)
Anion gap: 6 (ref 5–15)
BUN: 28 mg/dL — ABNORMAL HIGH (ref 8–23)
CO2: 24 mmol/L (ref 22–32)
Calcium: 9.4 mg/dL (ref 8.9–10.3)
Chloride: 107 mmol/L (ref 98–111)
Creatinine, Ser: 1.31 mg/dL — ABNORMAL HIGH (ref 0.44–1.00)
GFR, Estimated: 39 mL/min — ABNORMAL LOW (ref >60)
Glucose, Bld: 110 mg/dL — ABNORMAL HIGH (ref 70–99)
Potassium: 4 mmol/L (ref 3.5–5.1)
Sodium: 137 mmol/L (ref 135–145)
Total Bilirubin: 1.2 mg/dL (ref 0.3–1.2)
Total Protein: 6.5 g/dL (ref 6.5–8.1)

## 2020-11-30 LAB — CBC WITH DIFFERENTIAL/PLATELET
Abs Immature Granulocytes: 0 10*3/uL (ref 0.00–0.07)
Basophils Absolute: 0.1 10*3/uL (ref 0.0–0.1)
Basophils Relative: 2 %
Eosinophils Absolute: 1 10*3/uL — ABNORMAL HIGH (ref 0.0–0.5)
Eosinophils Relative: 14 %
HCT: 32.1 % — ABNORMAL LOW (ref 36.0–46.0)
Hemoglobin: 10.3 g/dL — ABNORMAL LOW (ref 12.0–15.0)
Immature Granulocytes: 0 %
Lymphocytes Relative: 32 %
Lymphs Abs: 2.2 10*3/uL (ref 0.7–4.0)
MCH: 30.6 pg (ref 26.0–34.0)
MCHC: 32.1 g/dL (ref 30.0–36.0)
MCV: 95.3 fL (ref 80.0–100.0)
Monocytes Absolute: 0.9 10*3/uL (ref 0.1–1.0)
Monocytes Relative: 13 %
Neutro Abs: 2.7 10*3/uL (ref 1.7–7.7)
Neutrophils Relative %: 39 %
Platelets: 181 10*3/uL (ref 150–400)
RBC: 3.37 MIL/uL — ABNORMAL LOW (ref 3.87–5.11)
RDW: 17.2 % — ABNORMAL HIGH (ref 11.5–15.5)
WBC: 6.8 10*3/uL (ref 4.0–10.5)
nRBC: 0 % (ref 0.0–0.2)

## 2020-11-30 LAB — MAGNESIUM: Magnesium: 1.8 mg/dL (ref 1.7–2.4)

## 2020-11-30 MED ORDER — FUROSEMIDE 20 MG PO TABS: 20.0000 mg | ORAL_TABLET | Freq: Every day | ORAL | 3 refills | Status: DC | PRN

## 2020-11-30 NOTE — Patient Instructions (Signed)
[place] at [facility] Discharge Instructions  You were seen today by Dr. [name]. He went over your recent results and scans. You will receive a refill on your Lasix; take it as needed for your leg swelling. Dr. [name] will see you back in 2 months for labs and follow up.   Thank you for choosing [place] at [facility] to provide your oncology and hematology care.  To afford each patient quality time with our provider, please arrive at least 15 minutes before your scheduled appointment time.   If you have a lab appointment with the [facility] please come in thru the Main Entrance and check in at the main information desk  You need to re-schedule your appointment should you arrive 10 or more minutes late.  We strive to give you quality time with our providers, and arriving late affects you and other patients whose appointments are after yours.  Also, if you no show three or more times for appointments you may be dismissed from the clinic at the providers discretion.     Again, thank you for choosing [facility].  Our hope is that these requests will decrease the amount of time that you wait before being seen by our physicians.       _____________________________________________________________  Should you have questions after your visit to [facility], please contact our office at [phone] between the hours of 8:00 a.m. and 4:30 p.m.  Voicemails left after 4:00 p.m. will not be returned until the following business day.  For prescription refill requests, have your pharmacy contact our office and allow 72 hours.    Cancer Center Support Programs:   > Cancer Support Group  2nd Tuesday of the month 1pm-2pm, Journey Room

## 2020-11-30 NOTE — Progress Notes (Signed)
[place] [address], [place] [address]   Patient Care Team: [name], MD as PCP - General (Internal Medicine) [name], MD as Consulting Physician (Dermatology) Gala Romney Cristopher Estimable, MD as Consulting Physician (Gastroenterology) [name], RN as [facility] Management  SUMMARY OF ONCOLOGIC HISTORY: Oncology History  Malignant neoplasm of upper-outer quadrant of right breast in female, estrogen receptor negative ([place])  07/03/2018 Initial Diagnosis   Palpable right breast mass for several months with skin discoloration, clinically skin trabecular thickening with nipple retraction measuring 7.2 x 4.3 x 4.2 cm, additional mass 4 o'clock position 2.6 cm, right axillary tail 1.2 cm right axillary lymph node 1.8 cm left breast irregular mass 1 cm, adjacent mass 0.9 cm together measuring 1.9 cm, no lymphadenopathy   07/03/2018 Pathology Results   Right breast biopsy: IDC grade 2, ER 0%, PR 0%, HER-2 positive, Ki-67 40%, lymph node biopsy negative, left breast biopsy complex sclerosing lesion    07/15/2018 Cancer Staging   Staging form: Breast, AJCC 8th Edition - Clinical: Stage IIB (cT3, cN0, cM0, G2, ER-, PR-, HER2+) - Signed by [name], MD on 07/15/2018   07/31/2018 - 10/30/2018 Chemotherapy   The patient had trastuzumab (HERCEPTIN) 336 mg in sodium chloride 0.9 % 250 mL chemo infusion, 4 mg/kg = 336 mg, Intravenous,  Once, 4 of 4 cycles Administration: 336 mg (07/31/2018), 168 mg (08/07/2018), 168 mg (08/28/2018), 168 mg (08/14/2018), 168 mg (08/21/2018), 168 mg (09/04/2018), 168 mg (09/11/2018), 168 mg (09/18/2018), 168 mg (10/02/2018), 168 mg (10/09/2018), 168 mg (10/16/2018), 168 mg (10/23/2018), 168 mg (10/30/2018) PACLitaxel (TAXOL) 78 mg in sodium chloride 0.9 % 250 mL chemo infusion (</= 78m/m2), 40 mg/m2 = 78 mg (50 % of original dose 80 mg/m2), Intravenous,  Once, 4 of 4 cycles Dose modification: 40 mg/m2 (50 % of original dose 80  mg/m2, Cycle 1, Reason: Patient Age), 53.3333 mg/m2 (66.7 % of original dose 80 mg/m2, Cycle 1, Reason: Provider Judgment), 45 mg/m2 (original dose 80 mg/m2, Cycle 3, Reason: Provider Judgment) Administration: 78 mg (07/31/2018), 102 mg (08/07/2018), 102 mg (08/28/2018), 102 mg (08/14/2018), 102 mg (08/21/2018), 102 mg (09/04/2018), 102 mg (09/18/2018), 84 mg (10/02/2018), 84 mg (10/09/2018), 84 mg (10/16/2018), 84 mg (10/23/2018), 84 mg (10/30/2018)  for chemotherapy treatment.    11/13/2018 - 01/25/2019 Chemotherapy   The patient had trastuzumab (HERCEPTIN) 504 mg in sodium chloride 0.9 % 250 mL chemo infusion, 6 mg/kg = 504 mg (100 % of original dose 6 mg/kg), Intravenous,  Once, 4 of 5 cycles Dose modification: 6 mg/kg (original dose 6 mg/kg, Cycle 1, Reason: Other (see comments), Comment: pt receving it ) Administration: 504 mg (11/13/2018), 504 mg (12/04/2018), 504 mg (01/04/2019), 504 mg (01/25/2019)  for chemotherapy treatment.    02/15/2019 - 04/06/2020 Chemotherapy   The patient had ado-trastuzumab emtansine (KADCYLA) 240 mg in sodium chloride 0.9 % 250 mL chemo infusion, 3 mg/kg = 240 mg (100 % of original dose 3 mg/kg), Intravenous, Once, 17 of 18 cycles Dose modification: 3 mg/kg (original dose 3 mg/kg, Cycle 1, Reason: Provider Judgment), 3 mg/kg (original dose 3 mg/kg, Cycle 2, Reason: Provider Judgment), 2.4 mg/kg (original dose 3 mg/kg, Cycle 11, Reason: Other (see comments), Comment: bili upper limit of normal), 2.4 mg/kg (original dose 3 mg/kg, Cycle 12, Reason: Other (see comments), Comment: elevated bilirubin) Administration: 240 mg (02/15/2019), 240 mg (03/08/2019), 240 mg (05/10/2019), 240 mg (03/29/2019), 240 mg (04/19/2019), 240 mg (06/01/2019), 240 mg (06/22/2019), 240 mg (07/20/2019),  240 mg (08/10/2019), 240 mg (09/01/2019), 200 mg (09/22/2019), 180 mg (11/03/2019), 180 mg (12/09/2019), 180 mg (01/06/2020), 180 mg (02/03/2020), 180 mg (03/09/2020), 180 mg (04/06/2020)  for chemotherapy treatment.     05/15/2020 -  Chemotherapy   The patient had dexamethasone (DECADRON) 4 MG tablet, 8 mg, Oral, Daily, 1 of 1 cycle, Start date: --, End date: -- palonosetron (ALOXI) injection 0.25 mg, 0.25 mg, Intravenous,  Once, 7 of 9 cycles Administration: 0.25 mg (05/15/2020), 0.25 mg (06/05/2020), 0.25 mg (08/24/2020), 0.25 mg (09/14/2020), 0.25 mg (06/26/2020), 0.25 mg (07/17/2020), 0.25 mg (10/05/2020) fam-trastuzumab deruxtecan-nxki (ENHERTU) 226 mg in dextrose 5 % 100 mL chemo infusion, 3.2 mg/kg = 226 mg (59.3 % of original dose 5.4 mg/kg), Intravenous,  Once, 7 of 9 cycles Dose modification: 3.2 mg/kg (original dose 5.4 mg/kg, Cycle 1, Reason: Provider Judgment), 4.4 mg/kg (original dose 5.4 mg/kg, Cycle 2, Reason: Provider Judgment), 4.4 mg/kg (original dose 5.4 mg/kg, Cycle 5, Reason: Provider Judgment), 3.2 mg/kg (original dose 5.4 mg/kg, Cycle 5, Reason: Provider Judgment), 3.2 mg/kg (original dose 5.4 mg/kg, Cycle 6, Reason: Provider Judgment), 4.4 mg/kg (original dose 5.4 mg/kg, Cycle 3, Reason: Provider Judgment), 200 mg (original dose 5.4 mg/kg, Cycle 5, Reason: Other (see comments), Comment: all pharmacy has on hand) Administration: 226 mg (05/15/2020), 312 mg (06/05/2020), 226 mg (09/14/2020), 312 mg (06/26/2020), 312 mg (07/17/2020), 200 mg (08/24/2020), 226 mg (10/05/2020)  for chemotherapy treatment.      CHIEF COMPLIANT: Follow-up for HER-2 positive right breast cancer to skin   INTERVAL HISTORY: [name] is a 87 y.o. female here today for follow up of her HER-2 positive right breast cancer to skin. Her last visit was on 10/26/2020.   Today she is accompanied by her son and she reports feeling well. Her appetite is excellent. She is moving around more and feeling better since the ammonia buildup resolved and her confusion is improving. She stopped taking Lasix 20 mg after running out on 01/30; she denies having any leg swelling. She is no longer scratching since getting a soft bristle shower  brush.   Her last COVID booster was several months ago, along with her flu shot.   REVIEW OF SYSTEMS:   Review of Systems  Constitutional: Positive for fatigue (75%). Negative for appetite change.  Cardiovascular: Negative for leg swelling.  Skin: Negative for itching.  Psychiatric/Behavioral: Positive for confusion (improving).  All other systems reviewed and are negative.   I have reviewed the past medical history, past surgical history, social history and family history with the patient and they are unchanged from previous note.   ALLERGIES:   is allergic to penicillins.   MEDICATIONS:  Current Outpatient Medications  Medication Sig Dispense Refill  . acetaminophen (TYLENOL) 325 MG tablet Take 650 mg by mouth every 6 (six) hours as needed for mild pain.    Marland Kitchen aspirin EC 81 MG tablet Take 1 tablet (81 mg total) by mouth daily. Swallow whole. 30 tablet 11  . carbamide peroxide (DEBROX) 6.5 % OTIC solution Place 5 drops into both ears 2 (two) times daily. 15 mL 0  . hydrocortisone 1 % ointment Apply 1 application topically 2 (two) times daily. 60 g 0  . lactulose (CHRONULAC) 10 GM/15ML solution Take 30 mLs (20 g total) by mouth 2 (two) times daily. 236 mL 0  . lidocaine-prilocaine (EMLA) cream Apply to port site one hour prior to appointment and cover with plastic wrap. 30 g 3  . Multiple Vitamin (MULTIVITAMIN WITH MINERALS)  TABS tablet Take 1 tablet by mouth daily.    . nadolol (CORGARD) 20 MG tablet Take 1 tablet (20 mg total) by mouth daily. 30 tablet 11  . pantoprazole (PROTONIX) 40 MG tablet Take 1 tablet (40 mg total) by mouth 2 (two) times daily before a meal. 60 tablet 11  . furosemide (LASIX) 20 MG tablet Take 1 tablet (20 mg total) by mouth daily as needed. 30 tablet 3  . rifaximin (XIFAXAN) 550 MG TABS tablet Take 1 tablet (550 mg total) by mouth 2 (two) times daily. (Patient not taking: Reported on 11/30/2020) 42 tablet 11   No current facility-administered medications  for this visit.   Facility-Administered Medications Ordered in Other Visits  Medication Dose Route Frequency Provider Last Rate Last Admin  . sodium chloride flush (NS) 0.9 % injection 10 mL  10 mL Intravenous PRN Derek Jack, MD   10 mL at 05/23/20 1025  . sodium chloride flush (NS) 0.9 % injection 10 mL  10 mL Intravenous PRN Derek Jack, MD   10 mL at 05/23/20 0900     PHYSICAL EXAMINATION: Performance status (ECOG): 1 - Symptomatic but completely ambulatory  Vitals:   11/30/20 1018  BP: (!) 175/67  Pulse: 68  Resp: 18  Temp: (!) 97.1 F (36.2 C)   Wt Readings from Last 3 Encounters:  11/30/20 163 lb 12.8 oz (74.3 kg)  10/26/20 174 lb 1.6 oz (79 kg)  10/13/20 189 lb 6 oz (85.9 kg)   Physical Exam Vitals reviewed.  Constitutional:      Appearance: Normal appearance.     Comments: In wheelchair  Cardiovascular:     Rate and Rhythm: Normal rate and regular rhythm.     Pulses: Normal pulses.     Heart sounds: Normal heart sounds.  Pulmonary:     Effort: Pulmonary effort is normal.     Breath sounds: Normal breath sounds.  Chest:  Breasts:     Right: No swelling, bleeding, inverted nipple, mass, nipple discharge, skin change, tenderness or axillary adenopathy.     Left: No inverted nipple, nipple discharge or axillary adenopathy.    Musculoskeletal:     Right lower leg: No edema.     Left lower leg: No edema.  Lymphadenopathy:     Upper Body:     Right upper body: No axillary or pectoral adenopathy.     Left upper body: No axillary or pectoral adenopathy.  Neurological:     General: No focal deficit present.     Mental Status: She is alert and oriented to person, place, and time.  Psychiatric:        Mood and Affect: Mood normal.        Behavior: Behavior normal.     Breast Exam Chaperone: Milinda Antis, MD     LABORATORY DATA:  I have reviewed the data as listed CMP Latest Ref Rng & Units 11/30/2020 10/26/2020 10/11/2020  Glucose 70 -  99 mg/dL 110(H) 104(H) 112(H)  BUN 8 - 23 mg/dL 28(H) 21 37(H)  Creatinine 0.44 - 1.00 mg/dL 1.31(H) 1.24(H) 1.52(H)  Sodium 135 - 145 mmol/L 137 136 138  Potassium 3.5 - 5.1 mmol/L 4.0 3.8 3.8  Chloride 98 - 111 mmol/L 107 107 111  CO2 22 - 32 mmol/L 24 22 20(L)  Calcium 8.9 - 10.3 mg/dL 9.4 8.6(L) 7.7(L)  Total Protein 6.5 - 8.1 g/dL 6.5 5.7(L) 4.7(L)  Total Bilirubin 0.3 - 1.2 mg/dL 1.2 1.0 1.4(H)  Alkaline Phos 38 - 126  U/L 166(H) 136(H) 76  AST 15 - 41 U/L 76(H) 51(H) 43(H)  ALT 0 - 44 U/L 35 25 23   No results found for: LTR320 Lab Results  Component Value Date   WBC 6.8 11/30/2020   HGB 10.3 (L) 11/30/2020   HCT 32.1 (L) 11/30/2020   MCV 95.3 11/30/2020   PLT 181 11/30/2020   NEUTROABS 2.7 11/30/2020    ASSESSMENT:  1. Metastatic right breast cancer to the skin, ER/PR negative, HER-2 positive: -Presentation with skin involvement of the right breast, axillary region and posterior chest wall. -12 cycles of weekly Taxol and Herceptin from 07/31/2018 through 10/30/2018 with PET scan showing progression. -Kadcyla from 02/15/2019 through 04/06/2020 with progression. -CT CAP on 04/26/2020 shows right breast mass measuring 3.7 x 2.8 (2.7 x 2.3). CC dimension measures 4.3 cm (3.9 cm). No new areas of metastatic disease. No skeletal metastasis. -Enhertustarted on 05/15/2020 at 3.4 mg/kg. -CT CAP from 08/03/2020 shows no evidence of lymphadenopathy or metastatic disease in the chest, abdomen or pelvis. Cirrhotic morphology of the liver with moderate volume ascites throughout the abdomen and pelvis. Wall thickening of the cecal base.  2. High risk drug monitoring: -2D echo on 04/03/2020 shows EF 65 to 70%.  3. New onset cirrhosis/ascites: -Admitted to hospital from 10/06/2020 through 10/13/2020 with GI bleed. -EGD showed grade 1 varices. She received 3 units PRBC.   PLAN:  1. Metastatic right breast cancer to the skin, ER/PR negative, HER-2 positive: -Physical examination today  did not reveal any erythema over her right back. Mild erythema in the right breast region. Right breast masses palpable. -Reviewed images of the PET scan with the patient and her husband. Persistent but clearly decreased hypermetabolism in the right breast lesion with skin thickening. There is hypermetabolic and enlarged posterior left axillary lymph node indeterminate. She received vaccine 2 months ago. No other findings for metastatic disease. -Also reviewed labs with her. We have stopped her treatments because of new onset cirrhosis and hospitalization. We will continue to closely monitor her. -RTC 2 months with physical exam and labs. I plan to repeat scan in 3 to 4 months.  2. Elevated LFTs: -AST is elevated at 76, total bilirubin 1.2, likely from cirrhosis.  3. CKD: -Creatinine today is 1.3.  4. Normocytic anemia: -Hemoglobin today improved to 10.3. Macrocytosis also improved.  5. New onset cirrhosis and ascites: -Continue lactulose for encephalopathy. -Continue Lasix as needed.    No orders of the defined types were placed in this encounter.  The patient has a good understanding of the overall plan. she agrees with it. she will call with any problems that may develop before the next visit here.    Derek Jack, MD [place] [phone]   I, Milinda Antis, am acting as a scribe for Dr. Sanda Linger.  I, Derek Jack MD, have reviewed the above documentation for accuracy and completeness, and I agree with the above.

## 2020-12-01 ENCOUNTER — Other Ambulatory Visit (HOSPITAL_COMMUNITY): Payer: Self-pay

## 2020-12-01 DIAGNOSIS — Z171 Estrogen receptor negative status [ER-]: Secondary | ICD-10-CM

## 2020-12-01 DIAGNOSIS — C50411 Malignant neoplasm of upper-outer quadrant of right female breast: Secondary | ICD-10-CM

## 2020-12-20 ENCOUNTER — Other Ambulatory Visit: Payer: Self-pay | Admitting: *Deleted

## 2020-12-20 NOTE — Patient Outreach (Signed)
[facility]) Care Management  12/20/2020  FAELYNN WYNDER 1930/05/12 [id number]   Outreach attempt #4, unsuccessful, HIPAA compliant voice message left.  No response from member after multiple unsuccessful outreach attempts and letter sent.  Will close case at this time due to inability to maintain contact.  Will notify member and primary MD of case closure.  [name], [place], MSN [place] [phone]

## 2020-12-25 DIAGNOSIS — J449 Chronic obstructive pulmonary disease, unspecified: Secondary | ICD-10-CM | POA: Diagnosis not present

## 2020-12-25 DIAGNOSIS — D630 Anemia in neoplastic disease: Secondary | ICD-10-CM | POA: Diagnosis not present

## 2020-12-25 DIAGNOSIS — E440 Moderate protein-calorie malnutrition: Secondary | ICD-10-CM | POA: Diagnosis not present

## 2020-12-25 DIAGNOSIS — K7290 Hepatic failure, unspecified without coma: Secondary | ICD-10-CM | POA: Diagnosis not present

## 2020-12-25 DIAGNOSIS — I110 Hypertensive heart disease with heart failure: Secondary | ICD-10-CM | POA: Diagnosis not present

## 2020-12-25 DIAGNOSIS — K7460 Unspecified cirrhosis of liver: Secondary | ICD-10-CM | POA: Diagnosis not present

## 2020-12-25 DIAGNOSIS — I509 Heart failure, unspecified: Secondary | ICD-10-CM | POA: Diagnosis not present

## 2020-12-25 DIAGNOSIS — E785 Hyperlipidemia, unspecified: Secondary | ICD-10-CM | POA: Diagnosis not present

## 2020-12-25 DIAGNOSIS — K922 Gastrointestinal hemorrhage, unspecified: Secondary | ICD-10-CM | POA: Diagnosis not present

## 2021-01-01 ENCOUNTER — Other Ambulatory Visit: Payer: Self-pay

## 2021-01-01 ENCOUNTER — Ambulatory Visit: Payer: Medicare HMO | Admitting: Gastroenterology

## 2021-01-01 ENCOUNTER — Encounter: Payer: Self-pay | Admitting: Gastroenterology

## 2021-01-01 VITALS — BP 150/74 | HR 72 | Temp 97.1°F | Ht 64.0 in | Wt 167.2 lb

## 2021-01-01 DIAGNOSIS — K7460 Unspecified cirrhosis of liver: Secondary | ICD-10-CM | POA: Diagnosis not present

## 2021-01-01 DIAGNOSIS — K921 Melena: Secondary | ICD-10-CM | POA: Diagnosis not present

## 2021-01-01 NOTE — Patient Instructions (Addendum)
1. Continue lactulose 30 cc twice daily, goal of 2-3 soft bowel movements daily.  May adjust dose accordingly. 2. Continue nadolol 20 mg daily to try to prevent bleeding from esophageal varices. 3. Continue pantoprazole 40 mg twice daily 4. Please have your labs done. 5. We will wait on ordering liver ultrasound until after you see Dr. [name] as he may do additional imaging that may see the liver. 6. We will need to see you every 6 months.  Call sooner if you have any questions or concerns.  Call if you have episodes of confusion, abdominal pain, blood in the stool or black stools, swelling in the legs or stomach.  At [facility] we value your feedback. You may receive a survey about your visit today. Please share your experience as we strive to create trusting relationships with our patients to provide genuine, compassionate, quality care.   We appreciate your understanding and patience as we review any laboratory studies, imaging, and other diagnostic tests that are ordered as we care for you. Our office policy is 5 business days for review of these results, and any emergent or urgent results are addressed in a timely manner for your best interest. If you do not hear from our office in 1 week, please contact us.    We also encourage the use of MyChart, which contains your medical information for your review as well. If you are not enrolled in this feature, an access code is on this after visit summary for your convenience. Thank you for allowing [place] to be involved in your care.

## 2021-01-01 NOTE — Progress Notes (Unsigned)
Primary Care Physician: [name], MD  Primary Gastroenterologist:  Garfield Cornea, MD   Chief Complaint  Patient presents with  . Follow-up    F/U from hospital, f/u -black stools, weakness, doing good recently    HPI: [name] is a 87 y.o. female here for hospital follow-up.  She was seen back in December. She has a history of breast cancer with ongoing chemotherapy who presented to the hospital back in December with acute onset weakness, near syncopal episode at home.  She passed multiple black BMs.  Hemoglobin 6.5 down from 11.8 just a few weeks prior.  She received multiple units of packed red blood cells, at least 3.  It was noted that her most recent CT imaging in October revealed cirrhotic appearing liver with ascites.  Initial EGD December 11 was incomplete demonstrating old blood in the proximal stomach which precluded complete examination.  Esophageal varices present without stigmata of bleeding.  Esophagitis present.  Coexisting Mallory-Weiss tear equivalent not excluded.  She developed decline in mental status/near coma during her hospitalization likely due to large protein load with upper GI bleed.MELD Na 16.  Treated with PPI infusion and octreotide infusion.  Started on propanolol for variceal bleed prophylaxis.  Paracentesis attempted but not enough fluid.  Repeat EGD December 15 showed grade 1 varices in the distal esophagus, LA grade B esophagitis, benign-appearing intrinsic mild stenosis of the esophagus, 4 cm hiatal hernia, erosive gastropathy. Mitochondrial antibodies were negative, hepatitis C antibody negative, hepatitis B surface antigen negative, H. pylori IgG negative.  Today: States she feels great. No episodes of confusion.  BM 3-4 per day on lactulose. No melena, brbpr. No abdominal pain.  No heartburn, dysphagia.  Thinks her bleeding was related to her chemotherapy.  Episode happened the day after one of her treatments.  She has not went back on chemotherapy  since that time and according to husband there is no plans to restart.  Appointment for follow-up next month with Dr. [name].  Lasix just prn    Current Outpatient Medications  Medication Sig Dispense Refill  . aspirin EC 81 MG tablet Take 1 tablet (81 mg total) by mouth daily. Swallow whole. 30 tablet 11  . carbamide peroxide (DEBROX) 6.5 % OTIC solution Place 5 drops into both ears 2 (two) times daily. 15 mL 0  . hydrocortisone 1 % ointment Apply 1 application topically 2 (two) times daily. (Patient taking differently: Apply 1 application topically as needed.) 60 g 0  . lactulose (CHRONULAC) 10 GM/15ML solution Take 30 mLs (20 g total) by mouth 2 (two) times daily. (Patient taking differently: Take 20 g by mouth as needed.) 236 mL 0  . Multiple Vitamin (MULTIVITAMIN WITH MINERALS) TABS tablet Take 1 tablet by mouth daily.    . nadolol (CORGARD) 20 MG tablet Take 1 tablet (20 mg total) by mouth daily. 30 tablet 11  . pantoprazole (PROTONIX) 40 MG tablet Take 1 tablet (40 mg total) by mouth 2 (two) times daily before a meal. (Patient taking differently: Take 40 mg by mouth daily.) 60 tablet 11   No current facility-administered medications for this visit.   Facility-Administered Medications Ordered in Other Visits  Medication Dose Route Frequency Provider Last Rate Last Admin  . sodium chloride flush (NS) 0.9 % injection 10 mL  10 mL Intravenous PRN Derek Jack, MD   10 mL at 05/23/20 1025  . sodium chloride flush (NS) 0.9 % injection 10 mL  10 mL Intravenous PRN Derek Jack,  MD   10 mL at 05/23/20 0900    Allergies as of 01/01/2021 - Review Complete 01/01/2021  Allergen Reaction Noted  . Penicillins Rash     ROS:  General: Negative for anorexia, weight loss, fever, chills, fatigue, weakness. ENT: Negative for hoarseness, difficulty swallowing , nasal congestion. CV: Negative for chest pain, angina, palpitations, dyspnea on exertion, peripheral edema.   Respiratory: Negative for dyspnea at rest, dyspnea on exertion, cough, sputum, wheezing.  GI: See history of present illness. GU:  Negative for dysuria, hematuria, urinary incontinence, urinary frequency, nocturnal urination.  Endo: Negative for unusual weight change.    Physical Examination:   BP (!) 150/74   Pulse 72   Temp (!) 97.1 F (36.2 C) (Temporal)   Ht 5\' 4"  (1.626 m)   Wt 167 lb 3.2 oz (75.8 kg)   BMI 28.70 kg/m   General: Well-nourished, well-developed in no acute distress.  Eyes: No icterus. Mouth: masked Lungs: Clear to auscultation bilaterally.  Heart: Regular rate and rhythm, no murmurs rubs or gallops.  Abdomen: Bowel sounds are normal, nontender, nondistended, no hepatosplenomegaly or masses, no abdominal bruits or hernia , no rebound or guarding.   Extremities: No lower extremity edema. No clubbing or deformities. Neuro: Alert and oriented x 4   Skin: Warm and dry, no jaundice.   Psych: Alert and cooperative, normal mood and affect.  Labs:  Lab Results  Component Value Date   CREATININE 1.31 (H) 11/30/2020   BUN 28 (H) 11/30/2020   NA 137 11/30/2020   K 4.0 11/30/2020   CL 107 11/30/2020   CO2 24 11/30/2020   Lab Results  Component Value Date   ALT 35 11/30/2020   AST 76 (H) 11/30/2020   ALKPHOS 166 (H) 11/30/2020   BILITOT 1.2 11/30/2020   Lab Results  Component Value Date   WBC 6.8 11/30/2020   HGB 10.3 (L) 11/30/2020   HCT 32.1 (L) 11/30/2020   MCV 95.3 11/30/2020   PLT 181 11/30/2020   Lab Results  Component Value Date   INR 1.2 10/13/2020   INR 1.2 10/12/2020   INR 1.3 (H) 10/11/2020     Imaging Studies: No results found.  Assessment/plan:  87 year old female with history of metastatic breast cancer, recent admission for upper GI bleed, newly diagnosed cirrhosis complicated by hepatic encephalopathy in the setting of upper GI bleed and esophageal varices.  Clinically she feels well.  No further bleeding noted.  Hemoglobin  back up to baseline.  LFTs with slight increase in alkaline phosphatase and AST which could be related to cirrhosis but also could be from nonliver related sources.  Patient has had no further episodes of hepatic encephalopathy since discharge.  Continues lactulose.  Continues nadolol for esophageal variceal bleeding prophylaxis.  She will be due for hepatoma screening in the near future but will wait to see if Dr. [name] orders any imaging to follow-up on her metastatic breast cancer.  She has an appointment to see him next month.  We will update labs at this time.   1. Continue lactulose 30 cc twice daily, goal of 2-3 soft bowel movements daily.  Titrate as needed. 2. Continue nadolol 20 mg daily. 3. Continue pantoprazole 40 mg twice daily. 4. AFP tumor marker, CBC,CMET, INR, hepatitis B surface antibody, hepatitis A total antibody, iron/TIBC/ferritin.   5. Will be due for hepatoma screening in April, wait until after she sees Dr. [name] to see if further imaging ordered that may allow [place] see liver/screen for  hepatoma.  If not, we will order right upper quadrant ultrasound. 6. Return office visit in 6 months.

## 2021-01-04 ENCOUNTER — Encounter: Payer: Self-pay | Admitting: Gastroenterology

## 2021-01-05 NOTE — Progress Notes (Signed)
Cc'ed to pcp °

## 2021-01-08 DIAGNOSIS — Z515 Encounter for palliative care: Secondary | ICD-10-CM | POA: Diagnosis not present

## 2021-01-08 DIAGNOSIS — C7989 Secondary malignant neoplasm of other specified sites: Secondary | ICD-10-CM | POA: Diagnosis not present

## 2021-01-08 DIAGNOSIS — C50411 Malignant neoplasm of upper-outer quadrant of right female breast: Secondary | ICD-10-CM | POA: Diagnosis not present

## 2021-01-08 DIAGNOSIS — Z171 Estrogen receptor negative status [ER-]: Secondary | ICD-10-CM | POA: Diagnosis not present

## 2021-01-08 DIAGNOSIS — K7460 Unspecified cirrhosis of liver: Secondary | ICD-10-CM | POA: Diagnosis not present

## 2021-01-08 DIAGNOSIS — N1832 Chronic kidney disease, stage 3b: Secondary | ICD-10-CM | POA: Diagnosis not present

## 2021-01-13 IMAGING — CT CT HEAD W/O CM
3 series · 16 of 47 positions shown, 19 images · non-contrast
Comparison: None.

CLINICAL DATA: Altered mental status.

EXAM:
CT HEAD WITHOUT CONTRAST
TECHNIQUE: Contiguous axial images were obtained from the base of the skull
through the vertex without intravenous contrast.

[Series 2: head w o · axial · 0.41mm/px · z∈[-15,+115]mm · 10 of 32 slices shown, 13 images]
[im 3/32  brain]
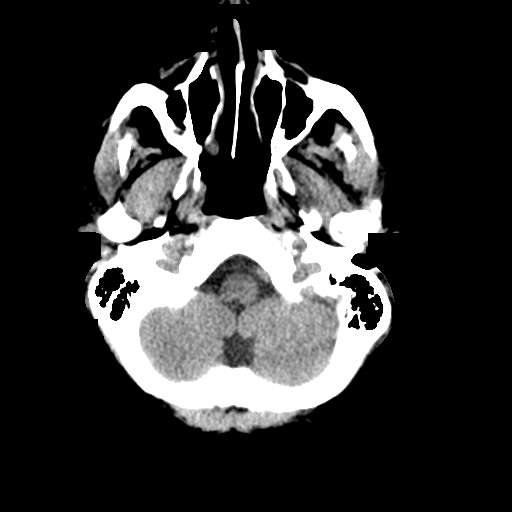
[im 3/32  bone]
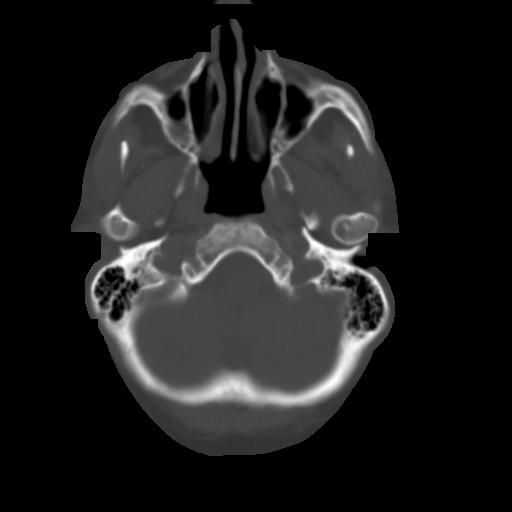
[im 6/32  brain]
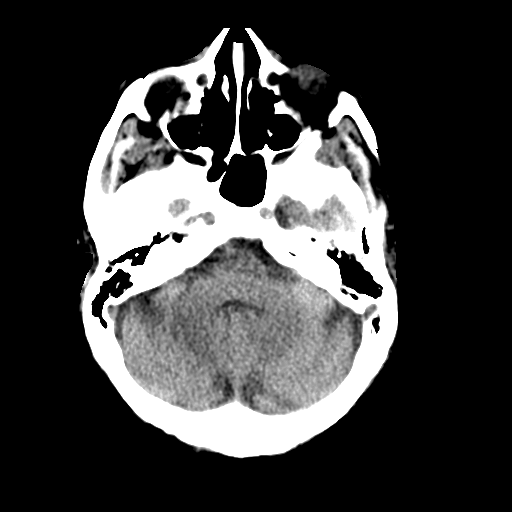
[im 9/32  brain]
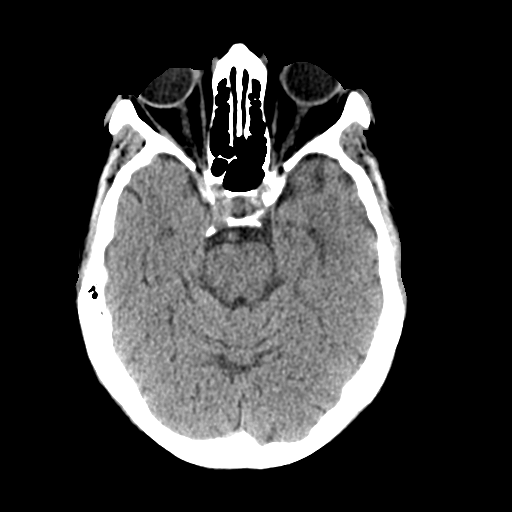
[im 11/32  brain]
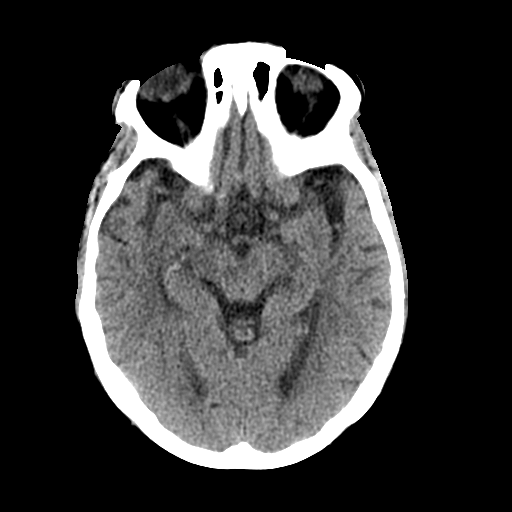
[im 14/32  brain]
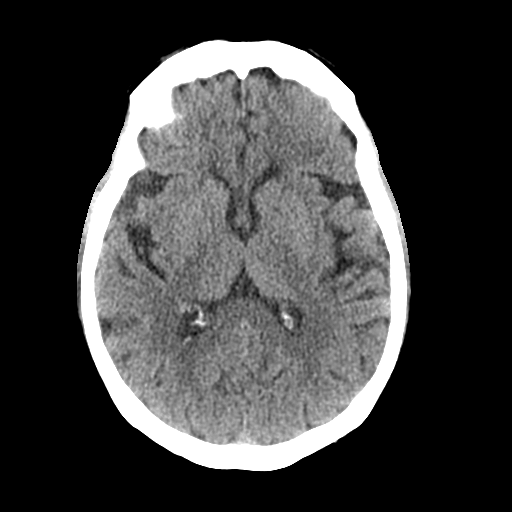
[im 14/32  bone]
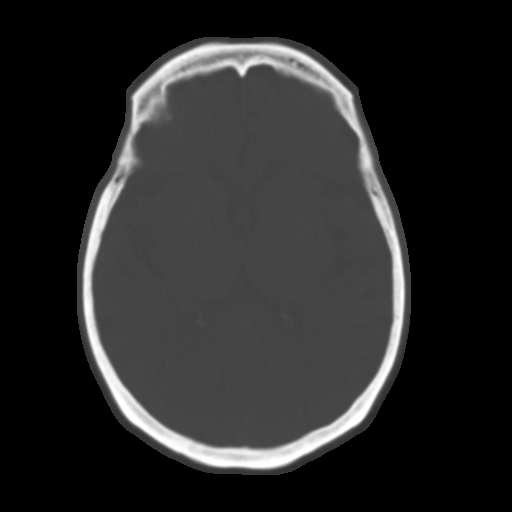
[im 18/32  brain]
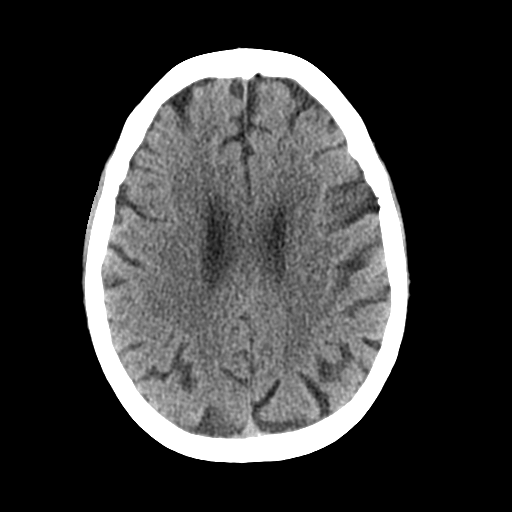
[im 21/32  brain]
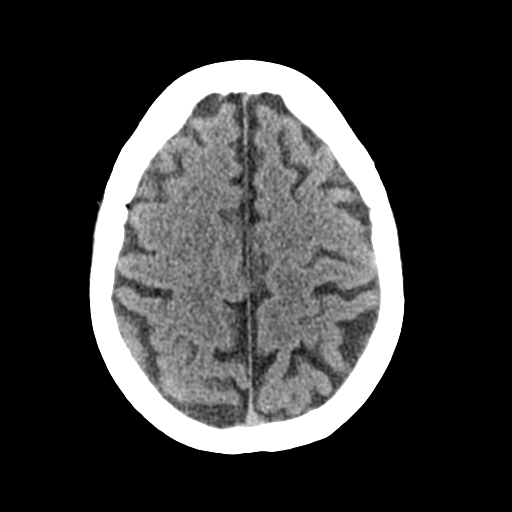
[im 24/32  brain]
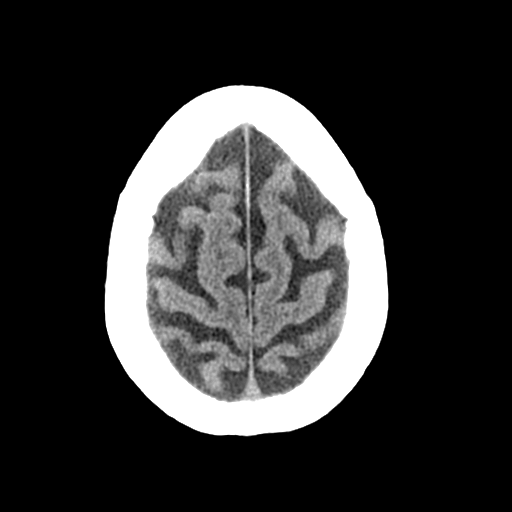
[im 26/32  brain]
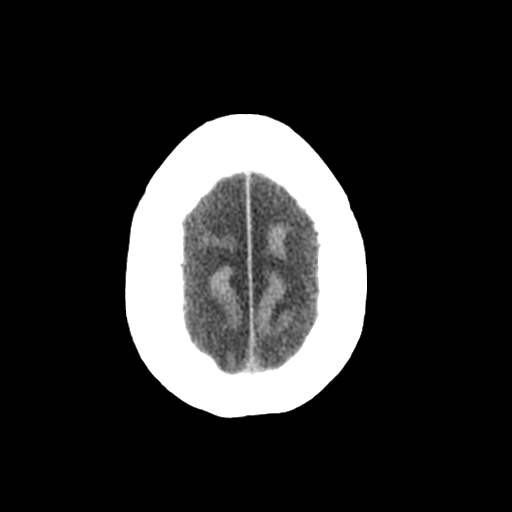
[im 26/32  bone]
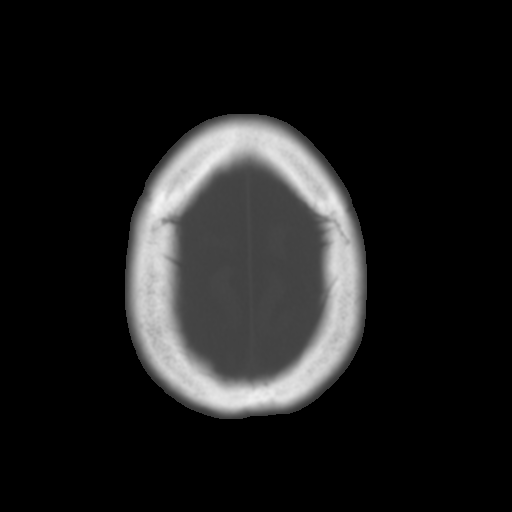
[im 29/32  brain]
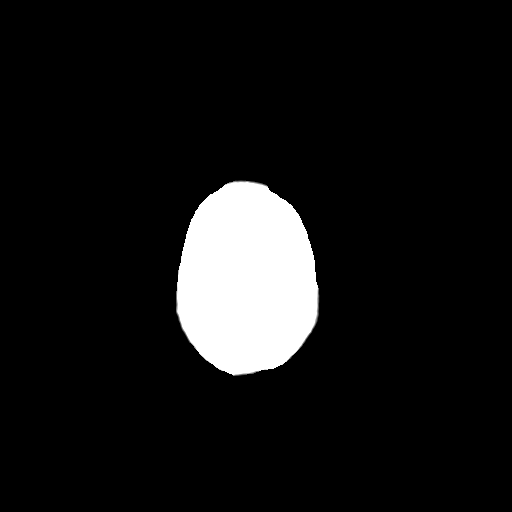

[Series 4: coronal soft · coronal · 0.31mm/px · 3 of 66 slices shown]
[im 22/66  brain]
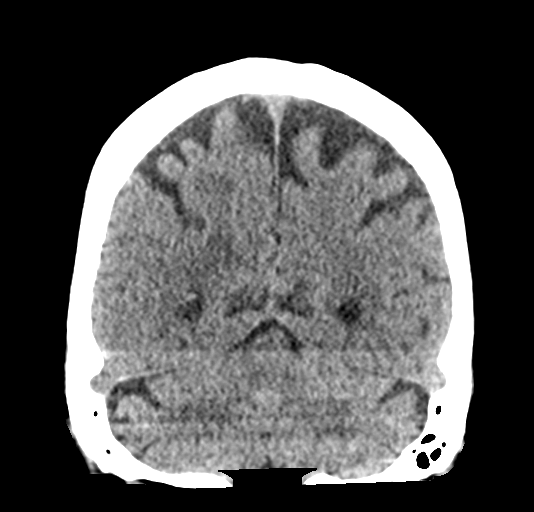
[im 29/66  brain]
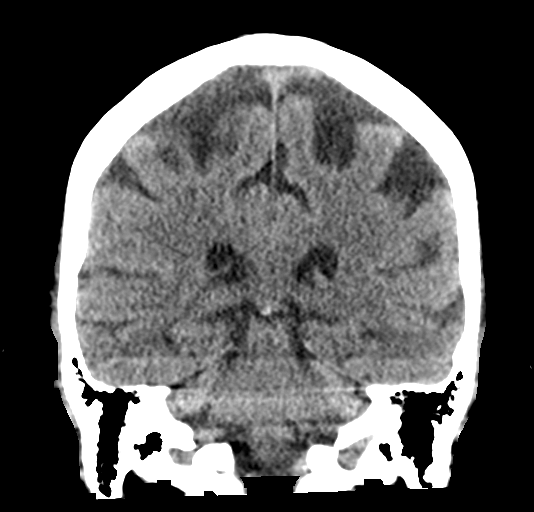
[im 37/66  brain]
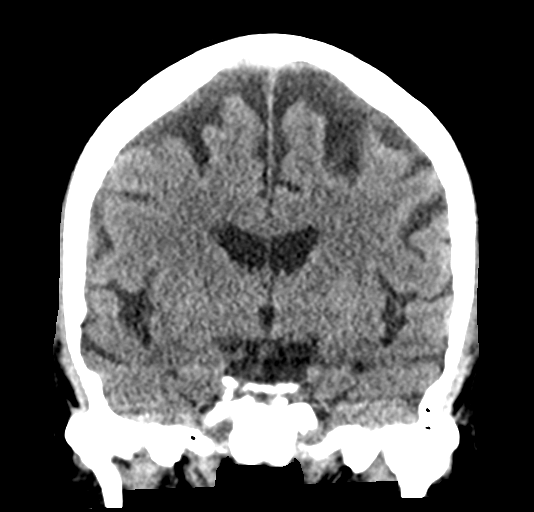

[Series 5: sagittal soft · sagittal · 0.31mm/px · 3 of 56 slices shown]
[im 19/56  brain]
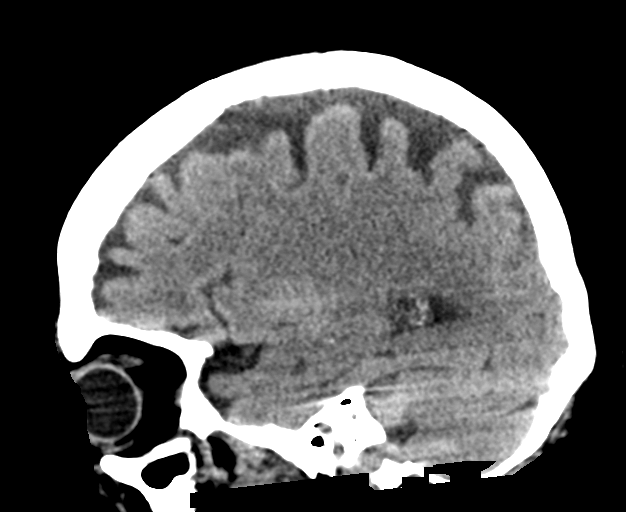
[im 28/56  brain]
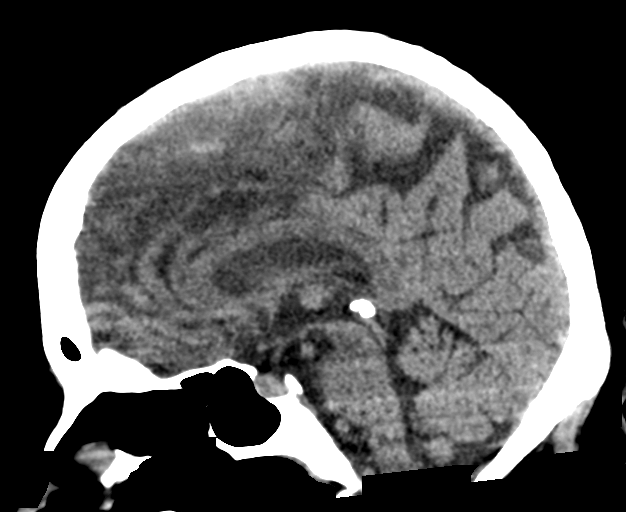
[im 37/56  brain]
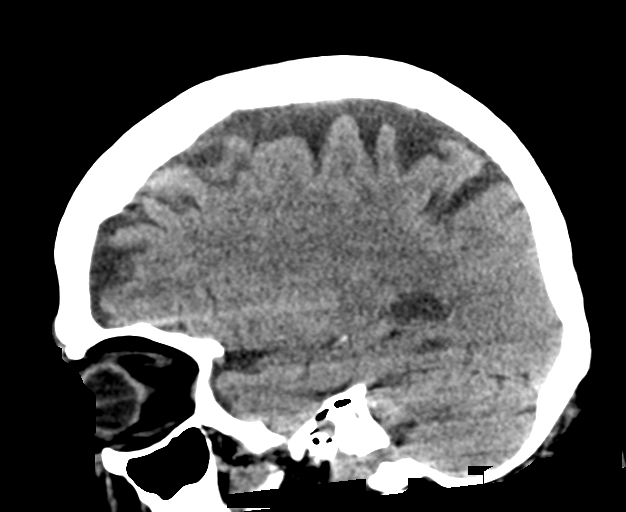

[16 of 47 positions shown; findings below may reference images not displayed]

FINDINGS: Brain: No evidence of acute infarction, hemorrhage, hydrocephalus,
extra-axial collection or mass lesion/mass effect.

Vascular: No hyperdense vessel or unexpected calcification.

Skull: Normal. Negative for fracture or focal lesion.

Sinuses/Orbits: No acute finding.

Other: None.
IMPRESSION: Normal head CT.

## 2021-01-15 IMAGING — US US ABDOMEN LIMITED
1 series · 3 of 3 positions shown · non-contrast
Comparison: CT abdomen and pelvis 08/03/2020

CLINICAL DATA: Ascites

EXAM:
LIMITED ABDOMEN ULTRASOUND FOR ASCITES
TECHNIQUE: Limited ultrasound survey for ascites was performed in all four
abdominal quadrants.

[Series 1: us paracentesis · 3 of 3 slices shown]
[im 1/3]
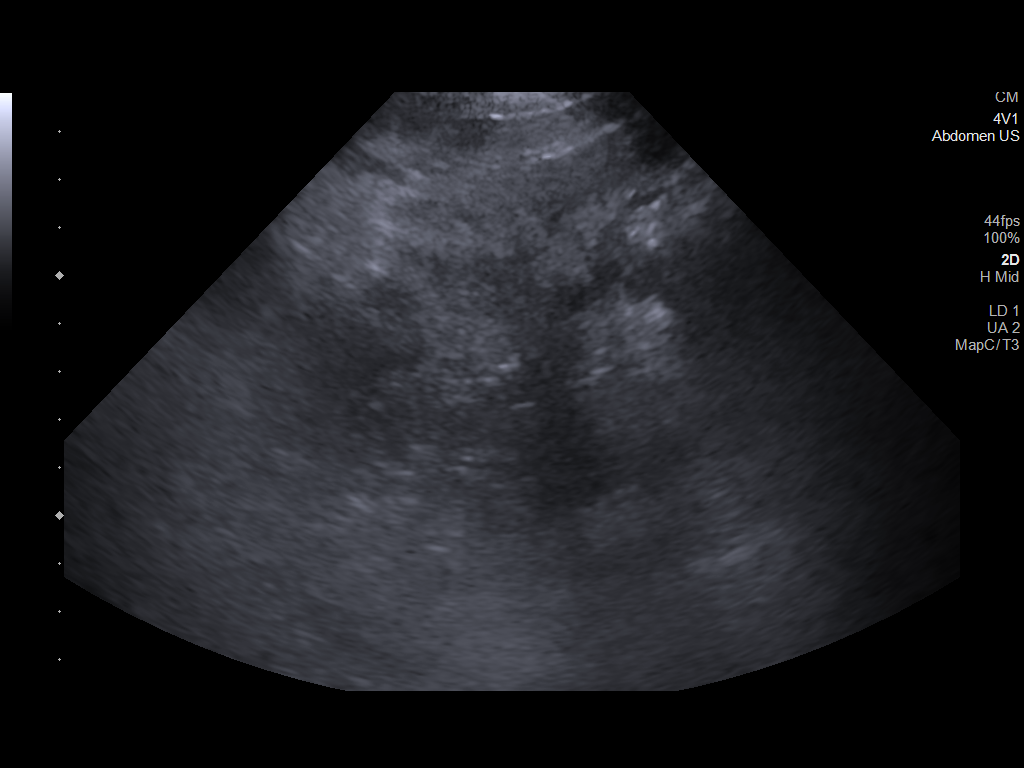
[im 2/3]
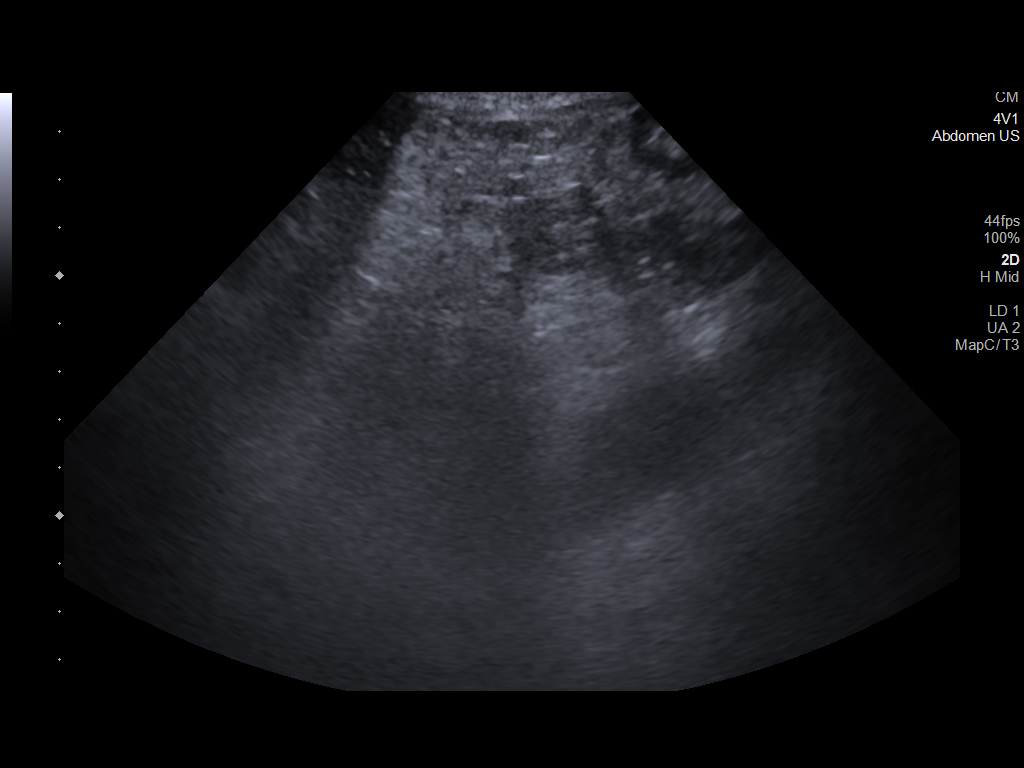
[im 3/3]
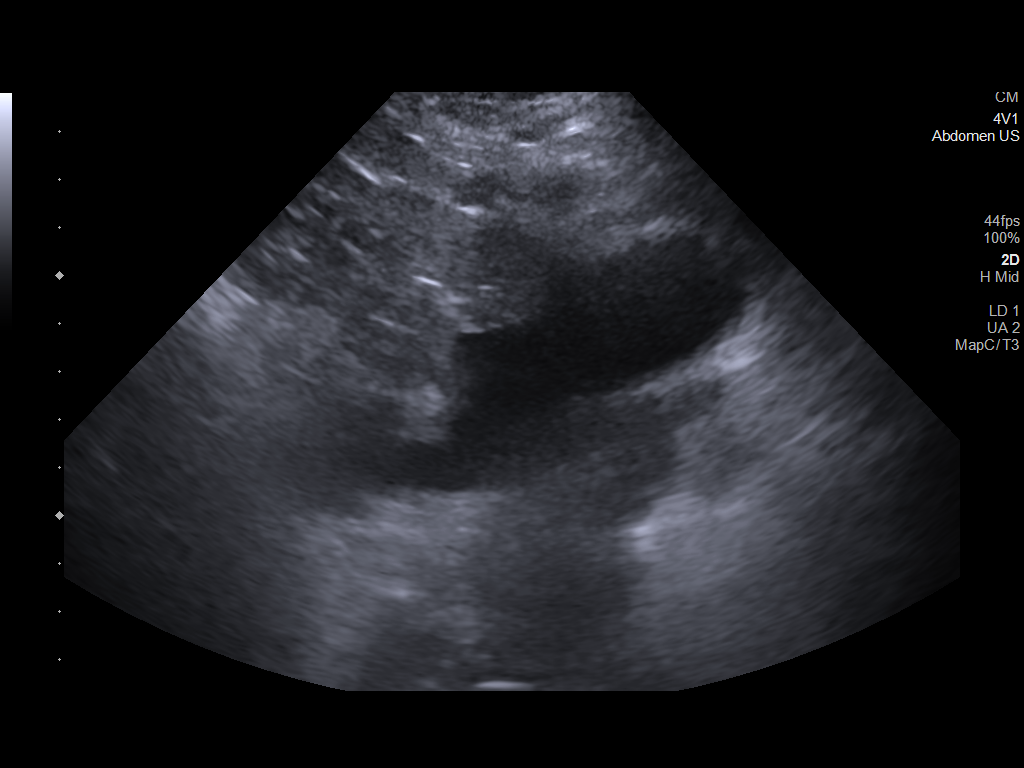

[3 of 3 positions shown; findings below may reference images not displayed]

FINDINGS: Minimal ascites seen in the mid abdomen.

No significant ascites is identified to permit paracentesis.
IMPRESSION: Insufficient ascites for paracentesis.

## 2021-01-18 DIAGNOSIS — N1832 Chronic kidney disease, stage 3b: Secondary | ICD-10-CM | POA: Diagnosis not present

## 2021-01-18 DIAGNOSIS — K7460 Unspecified cirrhosis of liver: Secondary | ICD-10-CM | POA: Diagnosis not present

## 2021-01-18 DIAGNOSIS — R945 Abnormal results of liver function studies: Secondary | ICD-10-CM | POA: Diagnosis not present

## 2021-01-18 DIAGNOSIS — E785 Hyperlipidemia, unspecified: Secondary | ICD-10-CM | POA: Diagnosis not present

## 2021-01-18 DIAGNOSIS — I110 Hypertensive heart disease with heart failure: Secondary | ICD-10-CM | POA: Diagnosis not present

## 2021-01-18 DIAGNOSIS — E559 Vitamin D deficiency, unspecified: Secondary | ICD-10-CM | POA: Diagnosis not present

## 2021-01-18 DIAGNOSIS — I129 Hypertensive chronic kidney disease with stage 1 through stage 4 chronic kidney disease, or unspecified chronic kidney disease: Secondary | ICD-10-CM | POA: Diagnosis not present

## 2021-01-18 DIAGNOSIS — I10 Essential (primary) hypertension: Secondary | ICD-10-CM | POA: Diagnosis not present

## 2021-01-18 DIAGNOSIS — E1121 Type 2 diabetes mellitus with diabetic nephropathy: Secondary | ICD-10-CM | POA: Diagnosis not present

## 2021-01-18 DIAGNOSIS — K7290 Hepatic failure, unspecified without coma: Secondary | ICD-10-CM | POA: Diagnosis not present

## 2021-01-23 DIAGNOSIS — E559 Vitamin D deficiency, unspecified: Secondary | ICD-10-CM | POA: Diagnosis not present

## 2021-01-23 DIAGNOSIS — C50911 Malignant neoplasm of unspecified site of right female breast: Secondary | ICD-10-CM | POA: Diagnosis not present

## 2021-01-23 DIAGNOSIS — R7303 Prediabetes: Secondary | ICD-10-CM | POA: Diagnosis not present

## 2021-01-23 DIAGNOSIS — R7301 Impaired fasting glucose: Secondary | ICD-10-CM | POA: Diagnosis not present

## 2021-01-23 DIAGNOSIS — R945 Abnormal results of liver function studies: Secondary | ICD-10-CM | POA: Diagnosis not present

## 2021-01-23 DIAGNOSIS — I129 Hypertensive chronic kidney disease with stage 1 through stage 4 chronic kidney disease, or unspecified chronic kidney disease: Secondary | ICD-10-CM | POA: Diagnosis not present

## 2021-01-23 DIAGNOSIS — N1832 Chronic kidney disease, stage 3b: Secondary | ICD-10-CM | POA: Diagnosis not present

## 2021-01-23 DIAGNOSIS — E782 Mixed hyperlipidemia: Secondary | ICD-10-CM | POA: Diagnosis not present

## 2021-01-23 DIAGNOSIS — E663 Overweight: Secondary | ICD-10-CM | POA: Diagnosis not present

## 2021-01-23 DIAGNOSIS — D631 Anemia in chronic kidney disease: Secondary | ICD-10-CM | POA: Diagnosis not present

## 2021-01-24 DIAGNOSIS — I10 Essential (primary) hypertension: Secondary | ICD-10-CM | POA: Diagnosis not present

## 2021-01-24 DIAGNOSIS — E7801 Familial hypercholesterolemia: Secondary | ICD-10-CM | POA: Diagnosis not present

## 2021-01-24 DIAGNOSIS — E1165 Type 2 diabetes mellitus with hyperglycemia: Secondary | ICD-10-CM | POA: Diagnosis not present

## 2021-01-24 DIAGNOSIS — E559 Vitamin D deficiency, unspecified: Secondary | ICD-10-CM | POA: Diagnosis not present

## 2021-01-24 DIAGNOSIS — R7301 Impaired fasting glucose: Secondary | ICD-10-CM | POA: Diagnosis not present

## 2021-01-24 DIAGNOSIS — E1121 Type 2 diabetes mellitus with diabetic nephropathy: Secondary | ICD-10-CM | POA: Diagnosis not present

## 2021-01-24 DIAGNOSIS — N182 Chronic kidney disease, stage 2 (mild): Secondary | ICD-10-CM | POA: Diagnosis not present

## 2021-01-24 DIAGNOSIS — S31829A Unspecified open wound of left buttock, initial encounter: Secondary | ICD-10-CM | POA: Diagnosis not present

## 2021-01-24 DIAGNOSIS — R2243 Localized swelling, mass and lump, lower limb, bilateral: Secondary | ICD-10-CM | POA: Diagnosis not present

## 2021-01-24 DIAGNOSIS — R2240 Localized swelling, mass and lump, unspecified lower limb: Secondary | ICD-10-CM | POA: Diagnosis not present

## 2021-02-05 DIAGNOSIS — C50411 Malignant neoplasm of upper-outer quadrant of right female breast: Secondary | ICD-10-CM | POA: Diagnosis not present

## 2021-02-05 DIAGNOSIS — K7460 Unspecified cirrhosis of liver: Secondary | ICD-10-CM | POA: Diagnosis not present

## 2021-02-05 DIAGNOSIS — Z515 Encounter for palliative care: Secondary | ICD-10-CM | POA: Diagnosis not present

## 2021-02-05 DIAGNOSIS — N1832 Chronic kidney disease, stage 3b: Secondary | ICD-10-CM | POA: Diagnosis not present

## 2021-02-05 DIAGNOSIS — Z171 Estrogen receptor negative status [ER-]: Secondary | ICD-10-CM | POA: Diagnosis not present

## 2021-02-05 DIAGNOSIS — C7989 Secondary malignant neoplasm of other specified sites: Secondary | ICD-10-CM | POA: Diagnosis not present

## 2021-02-05 NOTE — Patient Instructions (Signed)
Fe/TIBC/Fer, CBC, CMP, PT/INR, HgbA1c, AFP tumor marker, Hep B surface Ab, Vitamin D, and Hep A Ab results placed in Neil Crouch PA box.

## 2021-02-07 ENCOUNTER — Inpatient Hospital Stay (HOSPITAL_COMMUNITY): Payer: Medicare HMO

## 2021-02-07 ENCOUNTER — Inpatient Hospital Stay (HOSPITAL_COMMUNITY): Payer: Medicare HMO | Attending: Hematology | Admitting: Hematology

## 2021-02-07 ENCOUNTER — Other Ambulatory Visit: Payer: Self-pay

## 2021-02-07 VITALS — BP 162/79 | HR 82 | Temp 97.2°F | Resp 18 | Wt 167.5 lb

## 2021-02-07 DIAGNOSIS — I129 Hypertensive chronic kidney disease with stage 1 through stage 4 chronic kidney disease, or unspecified chronic kidney disease: Secondary | ICD-10-CM | POA: Insufficient documentation

## 2021-02-07 DIAGNOSIS — C50411 Malignant neoplasm of upper-outer quadrant of right female breast: Secondary | ICD-10-CM

## 2021-02-07 DIAGNOSIS — Z171 Estrogen receptor negative status [ER-]: Secondary | ICD-10-CM | POA: Diagnosis not present

## 2021-02-07 DIAGNOSIS — Z7982 Long term (current) use of aspirin: Secondary | ICD-10-CM | POA: Diagnosis not present

## 2021-02-07 DIAGNOSIS — D649 Anemia, unspecified: Secondary | ICD-10-CM | POA: Diagnosis not present

## 2021-02-07 DIAGNOSIS — N189 Chronic kidney disease, unspecified: Secondary | ICD-10-CM | POA: Diagnosis not present

## 2021-02-07 DIAGNOSIS — R188 Other ascites: Secondary | ICD-10-CM | POA: Insufficient documentation

## 2021-02-07 DIAGNOSIS — R948 Abnormal results of function studies of other organs and systems: Secondary | ICD-10-CM | POA: Diagnosis not present

## 2021-02-07 LAB — CBC WITH DIFFERENTIAL/PLATELET
Abs Immature Granulocytes: 0.01 10*3/uL (ref 0.00–0.07)
Basophils Absolute: 0.1 10*3/uL (ref 0.0–0.1)
Basophils Relative: 1 %
Eosinophils Absolute: 0.6 10*3/uL — ABNORMAL HIGH (ref 0.0–0.5)
Eosinophils Relative: 11 %
HCT: 34.7 % — ABNORMAL LOW (ref 36.0–46.0)
Hemoglobin: 11.2 g/dL — ABNORMAL LOW (ref 12.0–15.0)
Immature Granulocytes: 0 %
Lymphocytes Relative: 35 %
Lymphs Abs: 2 10*3/uL (ref 0.7–4.0)
MCH: 29.9 pg (ref 26.0–34.0)
MCHC: 32.3 g/dL (ref 30.0–36.0)
MCV: 92.8 fL (ref 80.0–100.0)
Monocytes Absolute: 0.9 10*3/uL (ref 0.1–1.0)
Monocytes Relative: 15 %
Neutro Abs: 2.2 10*3/uL (ref 1.7–7.7)
Neutrophils Relative %: 38 %
Platelets: 164 10*3/uL (ref 150–400)
RBC: 3.74 MIL/uL — ABNORMAL LOW (ref 3.87–5.11)
RDW: 19.7 % — ABNORMAL HIGH (ref 11.5–15.5)
WBC: 5.7 10*3/uL (ref 4.0–10.5)
nRBC: 0.3 % — ABNORMAL HIGH (ref 0.0–0.2)

## 2021-02-07 LAB — COMPREHENSIVE METABOLIC PANEL
ALT: 23 U/L (ref 0–44)
AST: 47 U/L — ABNORMAL HIGH (ref 15–41)
Albumin: 3.2 g/dL — ABNORMAL LOW (ref 3.5–5.0)
Alkaline Phosphatase: 167 U/L — ABNORMAL HIGH (ref 38–126)
Anion gap: 10 (ref 5–15)
BUN: 27 mg/dL — ABNORMAL HIGH (ref 8–23)
CO2: 23 mmol/L (ref 22–32)
Calcium: 9.2 mg/dL (ref 8.9–10.3)
Chloride: 105 mmol/L (ref 98–111)
Creatinine, Ser: 1.43 mg/dL — ABNORMAL HIGH (ref 0.44–1.00)
GFR, Estimated: 35 mL/min — ABNORMAL LOW (ref >60)
Glucose, Bld: 92 mg/dL (ref 70–99)
Potassium: 3.5 mmol/L (ref 3.5–5.1)
Sodium: 138 mmol/L (ref 135–145)
Total Bilirubin: 1.3 mg/dL — ABNORMAL HIGH (ref 0.3–1.2)
Total Protein: 6.7 g/dL (ref 6.5–8.1)

## 2021-02-07 LAB — MAGNESIUM: Magnesium: 1.9 mg/dL (ref 1.7–2.4)

## 2021-02-07 NOTE — Patient Instructions (Signed)
[place] at [facility] Discharge Instructions  You were seen today by Dr. [name]. He went over your recent results. You will be scheduled to have a PET scan done before your next visit. Dr. [name] will see you back in 2 months for labs and follow up.   Thank you for choosing [facility] at [facility] to provide your oncology and hematology care.  To afford each patient quality time with our provider, please arrive at least 15 minutes before your scheduled appointment time.   If you have a lab appointment with the [facility] please come in thru the Main Entrance and check in at the main information desk  You need to re-schedule your appointment should you arrive 10 or more minutes late.  We strive to give you quality time with our providers, and arriving late affects you and other patients whose appointments are after yours.  Also, if you no show three or more times for appointments you may be dismissed from the clinic at the providers discretion.     Again, thank you for choosing [facility].  Our hope is that these requests will decrease the amount of time that you wait before being seen by our physicians.       _____________________________________________________________  Should you have questions after your visit to [facility], please contact our office at [phone] between the hours of 8:00 a.m. and 4:30 p.m.  Voicemails left after 4:00 p.m. will not be returned until the following business day.  For prescription refill requests, have your pharmacy contact our office and allow 72 hours.    Cancer Center Support Programs:   > Cancer Support Group  2nd Tuesday of the month 1pm-2pm, Journey Room

## 2021-02-07 NOTE — Progress Notes (Signed)
[name] [address], [place] [address]   Patient Care Team: [name], MD as PCP - General (Internal Medicine) [name], MD as Consulting Physician (Dermatology) Gala Romney Cristopher Estimable, MD as Consulting Physician (Gastroenterology)  SUMMARY OF ONCOLOGIC HISTORY: Oncology History  Malignant neoplasm of upper-outer quadrant of right breast in female, estrogen receptor negative ([place])  07/03/2018 Initial Diagnosis   Palpable right breast mass for several months with skin discoloration, clinically skin trabecular thickening with nipple retraction measuring 7.2 x 4.3 x 4.2 cm, additional mass 4 o'clock position 2.6 cm, right axillary tail 1.2 cm right axillary lymph node 1.8 cm left breast irregular mass 1 cm, adjacent mass 0.9 cm together measuring 1.9 cm, no lymphadenopathy   07/03/2018 Pathology Results   Right breast biopsy: IDC grade 2, ER 0%, PR 0%, HER-2 positive, Ki-67 40%, lymph node biopsy negative, left breast biopsy complex sclerosing lesion    07/15/2018 Cancer Staging   Staging form: Breast, AJCC 8th Edition - Clinical: Stage IIB (cT3, cN0, cM0, G2, ER-, PR-, HER2+) - Signed by [name], MD on 07/15/2018   07/31/2018 - 10/30/2018 Chemotherapy   The patient had trastuzumab (HERCEPTIN) 336 mg in sodium chloride 0.9 % 250 mL chemo infusion, 4 mg/kg = 336 mg, Intravenous,  Once, 4 of 4 cycles Administration: 336 mg (07/31/2018), 168 mg (08/07/2018), 168 mg (08/28/2018), 168 mg (08/14/2018), 168 mg (08/21/2018), 168 mg (09/04/2018), 168 mg (09/11/2018), 168 mg (09/18/2018), 168 mg (10/02/2018), 168 mg (10/09/2018), 168 mg (10/16/2018), 168 mg (10/23/2018), 168 mg (10/30/2018) PACLitaxel (TAXOL) 78 mg in sodium chloride 0.9 % 250 mL chemo infusion (</= 31m/m2), 40 mg/m2 = 78 mg (50 % of original dose 80 mg/m2), Intravenous,  Once, 4 of 4 cycles Dose modification: 40 mg/m2 (50 % of original dose 80 mg/m2, Cycle 1, Reason: Patient Age), 53.3333 mg/m2 (66.7 % of  original dose 80 mg/m2, Cycle 1, Reason: Provider Judgment), 45 mg/m2 (original dose 80 mg/m2, Cycle 3, Reason: Provider Judgment) Administration: 78 mg (07/31/2018), 102 mg (08/07/2018), 102 mg (08/28/2018), 102 mg (08/14/2018), 102 mg (08/21/2018), 102 mg (09/04/2018), 102 mg (09/18/2018), 84 mg (10/02/2018), 84 mg (10/09/2018), 84 mg (10/16/2018), 84 mg (10/23/2018), 84 mg (10/30/2018)  for chemotherapy treatment.    11/13/2018 - 01/25/2019 Chemotherapy   The patient had trastuzumab (HERCEPTIN) 504 mg in sodium chloride 0.9 % 250 mL chemo infusion, 6 mg/kg = 504 mg (100 % of original dose 6 mg/kg), Intravenous,  Once, 4 of 5 cycles Dose modification: 6 mg/kg (original dose 6 mg/kg, Cycle 1, Reason: Other (see comments), Comment: pt receving it ) Administration: 504 mg (11/13/2018), 504 mg (12/04/2018), 504 mg (01/04/2019), 504 mg (01/25/2019)  for chemotherapy treatment.    02/15/2019 - 04/06/2020 Chemotherapy   The patient had ado-trastuzumab emtansine (KADCYLA) 240 mg in sodium chloride 0.9 % 250 mL chemo infusion, 3 mg/kg = 240 mg (100 % of original dose 3 mg/kg), Intravenous, Once, 17 of 18 cycles Dose modification: 3 mg/kg (original dose 3 mg/kg, Cycle 1, Reason: Provider Judgment), 3 mg/kg (original dose 3 mg/kg, Cycle 2, Reason: Provider Judgment), 2.4 mg/kg (original dose 3 mg/kg, Cycle 11, Reason: Other (see comments), Comment: bili upper limit of normal), 2.4 mg/kg (original dose 3 mg/kg, Cycle 12, Reason: Other (see comments), Comment: elevated bilirubin) Administration: 240 mg (02/15/2019), 240 mg (03/08/2019), 240 mg (05/10/2019), 240 mg (03/29/2019), 240 mg (04/19/2019), 240 mg (06/01/2019), 240 mg (06/22/2019), 240 mg (07/20/2019), 240 mg (08/10/2019), 240 mg (09/01/2019), 200 mg (09/22/2019),  180 mg (11/03/2019), 180 mg (12/09/2019), 180 mg (01/06/2020), 180 mg (02/03/2020), 180 mg (03/09/2020), 180 mg (04/06/2020)  for chemotherapy treatment.    05/15/2020 -  Chemotherapy   The patient had dexamethasone (DECADRON)  4 MG tablet, 8 mg, Oral, Daily, 1 of 1 cycle, Start date: --, End date: -- palonosetron (ALOXI) injection 0.25 mg, 0.25 mg, Intravenous,  Once, 7 of 9 cycles Administration: 0.25 mg (05/15/2020), 0.25 mg (06/05/2020), 0.25 mg (08/24/2020), 0.25 mg (09/14/2020), 0.25 mg (06/26/2020), 0.25 mg (07/17/2020), 0.25 mg (10/05/2020) fam-trastuzumab deruxtecan-nxki (ENHERTU) 226 mg in dextrose 5 % 100 mL chemo infusion, 3.2 mg/kg = 226 mg (59.3 % of original dose 5.4 mg/kg), Intravenous,  Once, 7 of 9 cycles Dose modification: 3.2 mg/kg (original dose 5.4 mg/kg, Cycle 1, Reason: Provider Judgment), 4.4 mg/kg (original dose 5.4 mg/kg, Cycle 2, Reason: Provider Judgment), 4.4 mg/kg (original dose 5.4 mg/kg, Cycle 5, Reason: Provider Judgment), 3.2 mg/kg (original dose 5.4 mg/kg, Cycle 5, Reason: Provider Judgment), 3.2 mg/kg (original dose 5.4 mg/kg, Cycle 6, Reason: Provider Judgment), 4.4 mg/kg (original dose 5.4 mg/kg, Cycle 3, Reason: Provider Judgment), 200 mg (original dose 5.4 mg/kg, Cycle 5, Reason: Other (see comments), Comment: all pharmacy has on hand) Administration: 226 mg (05/15/2020), 312 mg (06/05/2020), 226 mg (09/14/2020), 312 mg (06/26/2020), 312 mg (07/17/2020), 200 mg (08/24/2020), 226 mg (10/05/2020)  for chemotherapy treatment.      CHIEF COMPLIANT: Follow-up for right breast cancer   INTERVAL HISTORY: [name] is a 87 y.o. female here today for follow up of her right breast cancer. Her last visit was on 11/30/2020.   Today she is accompanied by her husband and she reports feeling well. Her appetite is excellent and she denies having any new pain. She denies having any more hematemesis or SOB. She continues taking lactulose though she reports having bloating and gas while she is taking it. She gets leg swelling and has to prop up her legs which helps decrease the swelling.   REVIEW OF SYSTEMS:   Review of Systems  Constitutional: Positive for fatigue (75%). Negative for appetite change.   Respiratory: Negative for hemoptysis and shortness of breath.   Cardiovascular: Positive for leg swelling. Negative for chest pain.  Gastrointestinal: Negative for abdominal distention.  Musculoskeletal: Negative for arthralgias and myalgias.  All other systems reviewed and are negative.   I have reviewed the past medical history, past surgical history, social history and family history with the patient and they are unchanged from previous note.   ALLERGIES:   is allergic to penicillins.   MEDICATIONS:  Current Outpatient Medications  Medication Sig Dispense Refill  . aspirin EC 81 MG tablet Take 1 tablet (81 mg total) by mouth daily. Swallow whole. 30 tablet 11  . carbamide peroxide (DEBROX) 6.5 % OTIC solution Place 5 drops into both ears 2 (two) times daily. 15 mL 0  . hydrocortisone 1 % ointment Apply 1 application topically 2 (two) times daily. (Patient taking differently: Apply 1 application topically as needed.) 60 g 0  . lactulose (CHRONULAC) 10 GM/15ML solution Take 30 mLs (20 g total) by mouth 2 (two) times daily. (Patient taking differently: Take 20 g by mouth as needed.) 236 mL 0  . Multiple Vitamin (MULTIVITAMIN WITH MINERALS) TABS tablet Take 1 tablet by mouth daily.    . nadolol (CORGARD) 20 MG tablet Take 1 tablet (20 mg total) by mouth daily. 30 tablet 11  . ONETOUCH ULTRA test strip     . pantoprazole (PROTONIX)  40 MG tablet Take 1 tablet (40 mg total) by mouth 2 (two) times daily before a meal. (Patient taking differently: Take 40 mg by mouth daily.) 60 tablet 11   No current facility-administered medications for this visit.   Facility-Administered Medications Ordered in Other Visits  Medication Dose Route Frequency Provider Last Rate Last Admin  . sodium chloride flush (NS) 0.9 % injection 10 mL  10 mL Intravenous PRN Derek Jack, MD   10 mL at 05/23/20 1025  . sodium chloride flush (NS) 0.9 % injection 10 mL  10 mL Intravenous PRN Derek Jack,  MD   10 mL at 05/23/20 0900     PHYSICAL EXAMINATION: Performance status (ECOG): 1 - Symptomatic but completely ambulatory  Vitals:   02/07/21 1103  BP: (!) 162/79  Pulse: 82  Resp: 18  Temp: (!) 97.2 F (36.2 C)  SpO2: 100%   Wt Readings from Last 3 Encounters:  02/07/21 167 lb 8 oz (76 kg)  01/01/21 167 lb 3.2 oz (75.8 kg)  11/30/20 163 lb 12.8 oz (74.3 kg)   Physical Exam Vitals reviewed.  Constitutional:      Appearance: Normal appearance.     Comments: In wheelchair  Cardiovascular:     Rate and Rhythm: Normal rate and regular rhythm.     Pulses: Normal pulses.     Heart sounds: Murmur (systolic ejection) heard.    Pulmonary:     Effort: Pulmonary effort is normal.     Breath sounds: Normal breath sounds.  Chest:  Breasts:     Right: Inverted nipple, mass (in UOQ) and skin change (mild erythema) present. No nipple discharge, tenderness or axillary adenopathy.    Abdominal:     Palpations: Abdomen is soft. There is no mass.     Tenderness: There is no abdominal tenderness.  Musculoskeletal:     Right lower leg: Edema (1+) present.     Left lower leg: Edema (1+) present.  Lymphadenopathy:     Upper Body:     Right upper body: No axillary or pectoral adenopathy.  Skin:    Findings: No abrasion.  Neurological:     General: No focal deficit present.     Mental Status: She is alert and oriented to person, place, and time.  Psychiatric:        Mood and Affect: Mood normal.        Behavior: Behavior normal.     Breast Exam Chaperone: Adrienne Antis, MD     LABORATORY DATA:  I have reviewed the data as listed CMP Latest Ref Rng & Units 02/07/2021 11/30/2020 10/26/2020  Glucose 70 - 99 mg/dL 92 110(H) 104(H)  BUN 8 - 23 mg/dL 27(H) 28(H) 21  Creatinine 0.44 - 1.00 mg/dL 1.43(H) 1.31(H) 1.24(H)  Sodium 135 - 145 mmol/L 138 137 136  Potassium 3.5 - 5.1 mmol/L 3.5 4.0 3.8  Chloride 98 - 111 mmol/L 105 107 107  CO2 22 - 32 mmol/L _0 Calcium 8.9 -  10.3 mg/dL 9.2 9.4 8.6(L)  Total Protein 6.5 - 8.1 g/dL 6.7 6.5 5.7(L)  Total Bilirubin 0.3 - 1.2 mg/dL 1.3(H) 1.2 1.0  Alkaline Phos 38 - 126 U/L 167(H) 166(H) 136(H)  AST 15 - 41 U/L 47(H) 76(H) 51(H)  ALT 0 - 44 U/L 23 35 25   No results found for: MBP112 Lab Results  Component Value Date   WBC 5.7 02/07/2021   HGB 11.2 (L) 02/07/2021   HCT 34.7 (L) 02/07/2021   MCV  92.8 02/07/2021   PLT 164 02/07/2021   NEUTROABS 2.2 02/07/2021    ASSESSMENT:  1. Metastatic right breast cancer to the skin, ER/PR negative, HER-2 positive: -Presentation with skin involvement of the right breast, axillary region and posterior chest wall. -12 cycles of weekly Taxol and Herceptin from 07/31/2018 through 10/30/2018 with PET scan showing progression. -Kadcyla from 02/15/2019 through 04/06/2020 with progression. -CT CAP on 04/26/2020 shows right breast mass measuring 3.7 x 2.8 (2.7 x 2.3). CC dimension measures 4.3 cm (3.9 cm). No new areas of metastatic disease. No skeletal metastasis. -Enhertustarted on 05/15/2020 at 3.4 mg/kg. -CT CAP from 08/03/2020 shows no evidence of lymphadenopathy or metastatic disease in the chest, abdomen or pelvis. Cirrhotic morphology of the liver with moderate volume ascites throughout the abdomen and pelvis. Wall thickening of the cecal base.  2. High risk drug monitoring: -2D echo on 04/03/2020 shows EF 65 to 70%.  3. New onset cirrhosis/ascites: -Admitted to hospital from 10/06/2020 through 10/13/2020 with GI bleed. -EGD showed grade 1 varices. She received 3 units PRBC.   PLAN:  1. Metastatic right breast cancer to the skin, ER/PR negative, HER-2 positive: -Physical examination today did not reveal any erythema over the right back.  Mild erythema in the right breast region.  Right breast mass above the areola palpable.  No palpable adenopathy. -Her last PET CT scan on 11/27/2020 slightly improved the right breast mass. -Reviewed her labs today.  Recommend  follow-up visit in 2 months with a repeat PET CT scan.  2. Elevated LFTs: -AST elevated at 47, improved from last time.  Total bilirubin 1.3 likely from cirrhosis.  3. CKD: -Creatinine today is 1.43, slightly up from 1.31.  Likely from Lasix.  4. Normocytic anemia: -Hemoglobin today improved to 11.2.  MCV has normalized.  5. New onset cirrhosis and ascites: -She stopped taking lactulose.  Continue follow-up with GI.    No orders of the defined types were placed in this encounter.  The patient has a good understanding of the overall plan. she agrees with it. she will call with any problems that may develop before the next visit here.    Derek Jack, MD [place] [phone]   I, [name], am acting as a scribe for Dr. Sanda Linger.  I, Derek Jack MD, have reviewed the above documentation for accuracy and completeness, and I agree with the above.

## 2021-02-12 DIAGNOSIS — Z961 Presence of intraocular lens: Secondary | ICD-10-CM | POA: Diagnosis not present

## 2021-02-12 DIAGNOSIS — H16143 Punctate keratitis, bilateral: Secondary | ICD-10-CM | POA: Diagnosis not present

## 2021-02-12 DIAGNOSIS — H43393 Other vitreous opacities, bilateral: Secondary | ICD-10-CM | POA: Diagnosis not present

## 2021-02-12 DIAGNOSIS — H0102B Squamous blepharitis left eye, upper and lower eyelids: Secondary | ICD-10-CM | POA: Diagnosis not present

## 2021-02-12 DIAGNOSIS — H0102A Squamous blepharitis right eye, upper and lower eyelids: Secondary | ICD-10-CM | POA: Diagnosis not present

## 2021-02-25 DIAGNOSIS — K219 Gastro-esophageal reflux disease without esophagitis: Secondary | ICD-10-CM | POA: Diagnosis not present

## 2021-02-25 DIAGNOSIS — E1165 Type 2 diabetes mellitus with hyperglycemia: Secondary | ICD-10-CM | POA: Diagnosis not present

## 2021-03-05 DIAGNOSIS — C50411 Malignant neoplasm of upper-outer quadrant of right female breast: Secondary | ICD-10-CM | POA: Diagnosis not present

## 2021-03-05 DIAGNOSIS — Z171 Estrogen receptor negative status [ER-]: Secondary | ICD-10-CM | POA: Diagnosis not present

## 2021-03-05 DIAGNOSIS — N1832 Chronic kidney disease, stage 3b: Secondary | ICD-10-CM | POA: Diagnosis not present

## 2021-03-05 DIAGNOSIS — C7989 Secondary malignant neoplasm of other specified sites: Secondary | ICD-10-CM | POA: Diagnosis not present

## 2021-03-05 DIAGNOSIS — Z515 Encounter for palliative care: Secondary | ICD-10-CM | POA: Diagnosis not present

## 2021-03-05 DIAGNOSIS — K7460 Unspecified cirrhosis of liver: Secondary | ICD-10-CM | POA: Diagnosis not present

## 2021-03-12 DIAGNOSIS — U071 COVID-19: Secondary | ICD-10-CM | POA: Diagnosis not present

## 2021-03-12 DIAGNOSIS — J069 Acute upper respiratory infection, unspecified: Secondary | ICD-10-CM | POA: Diagnosis not present

## 2021-03-12 DIAGNOSIS — B349 Viral infection, unspecified: Secondary | ICD-10-CM | POA: Diagnosis not present

## 2021-03-12 NOTE — Progress Notes (Signed)
Labs from January 18, 2021: TIBC 387, iron 55, iron saturations 14%, ferritin 54, white blood cell count 6200, hemoglobin 10.8, hematocrit 33.6, MCV 92, platelets 189,000, BUN 24, creatinine 1.36, sodium 139 albumin 3.3, alkaline phosphatase 189, AST 56, ALT 25, total bilirubin 0.8, INR 1.1, A1c 5.6, AFP tumor marker 2.8, hepatitis B surface antibody nonreactive, hepatitis A antibody positive.  MELD Na 11 Immune to Hep A Not immune to Hep B.  No hemochromatosis. Keep ov as planned.  Will follow up on June imaging of the liver for hepatoma screening (planned by Dr. Raliegh Ip).

## 2021-03-15 DIAGNOSIS — U071 COVID-19: Secondary | ICD-10-CM | POA: Diagnosis not present

## 2021-03-15 DIAGNOSIS — B349 Viral infection, unspecified: Secondary | ICD-10-CM | POA: Diagnosis not present

## 2021-03-19 DIAGNOSIS — J209 Acute bronchitis, unspecified: Secondary | ICD-10-CM | POA: Diagnosis not present

## 2021-03-19 DIAGNOSIS — U099 Post covid-19 condition, unspecified: Secondary | ICD-10-CM | POA: Diagnosis not present

## 2021-03-21 ENCOUNTER — Other Ambulatory Visit: Payer: Self-pay

## 2021-03-21 ENCOUNTER — Emergency Department (HOSPITAL_COMMUNITY): Payer: Medicare HMO

## 2021-03-21 ENCOUNTER — Encounter (HOSPITAL_COMMUNITY): Payer: Self-pay

## 2021-03-21 ENCOUNTER — Emergency Department (HOSPITAL_COMMUNITY)
Admission: EM | Admit: 2021-03-21 | Discharge: 2021-03-22 | Disposition: A | Discharge status: Home / Self Care | Payer: Medicare HMO | Attending: Emergency Medicine | Admitting: Emergency Medicine

## 2021-03-21 DIAGNOSIS — X58XXXA Exposure to other specified factors, initial encounter: Secondary | ICD-10-CM | POA: Diagnosis not present

## 2021-03-21 DIAGNOSIS — Z79899 Other long term (current) drug therapy: Secondary | ICD-10-CM | POA: Insufficient documentation

## 2021-03-21 DIAGNOSIS — R251 Tremor, unspecified: Secondary | ICD-10-CM | POA: Diagnosis not present

## 2021-03-21 DIAGNOSIS — U071 COVID-19: Secondary | ICD-10-CM | POA: Diagnosis not present

## 2021-03-21 DIAGNOSIS — I10 Essential (primary) hypertension: Secondary | ICD-10-CM | POA: Diagnosis not present

## 2021-03-21 DIAGNOSIS — T3695XA Adverse effect of unspecified systemic antibiotic, initial encounter: Secondary | ICD-10-CM | POA: Insufficient documentation

## 2021-03-21 DIAGNOSIS — R4182 Altered mental status, unspecified: Secondary | ICD-10-CM | POA: Diagnosis not present

## 2021-03-21 DIAGNOSIS — R2681 Unsteadiness on feet: Secondary | ICD-10-CM | POA: Insufficient documentation

## 2021-03-21 DIAGNOSIS — R059 Cough, unspecified: Secondary | ICD-10-CM | POA: Diagnosis not present

## 2021-03-21 DIAGNOSIS — Z7982 Long term (current) use of aspirin: Secondary | ICD-10-CM | POA: Diagnosis not present

## 2021-03-21 DIAGNOSIS — R000 Tachycardia, unspecified: Secondary | ICD-10-CM | POA: Diagnosis not present

## 2021-03-21 DIAGNOSIS — Z853 Personal history of malignant neoplasm of breast: Secondary | ICD-10-CM | POA: Diagnosis not present

## 2021-03-21 DIAGNOSIS — T50905A Adverse effect of unspecified drugs, medicaments and biological substances, initial encounter: Secondary | ICD-10-CM

## 2021-03-21 DIAGNOSIS — Z96653 Presence of artificial knee joint, bilateral: Secondary | ICD-10-CM | POA: Diagnosis not present

## 2021-03-21 LAB — BASIC METABOLIC PANEL
Anion gap: 8 (ref 5–15)
BUN: 38 mg/dL — ABNORMAL HIGH (ref 8–23)
CO2: 19 mmol/L — ABNORMAL LOW (ref 22–32)
Calcium: 8.9 mg/dL (ref 8.9–10.3)
Chloride: 108 mmol/L (ref 98–111)
Creatinine, Ser: 1.26 mg/dL — ABNORMAL HIGH (ref 0.44–1.00)
GFR, Estimated: 41 mL/min — ABNORMAL LOW (ref >60)
Glucose, Bld: 117 mg/dL — ABNORMAL HIGH (ref 70–99)
Potassium: 3.5 mmol/L (ref 3.5–5.1)
Sodium: 135 mmol/L (ref 135–145)

## 2021-03-21 LAB — CBC WITH DIFFERENTIAL/PLATELET
Abs Immature Granulocytes: 0.05 10*3/uL (ref 0.00–0.07)
Basophils Absolute: 0 10*3/uL (ref 0.0–0.1)
Basophils Relative: 0 %
Eosinophils Absolute: 0 10*3/uL (ref 0.0–0.5)
Eosinophils Relative: 0 %
HCT: 34.9 % — ABNORMAL LOW (ref 36.0–46.0)
Hemoglobin: 11.8 g/dL — ABNORMAL LOW (ref 12.0–15.0)
Immature Granulocytes: 1 %
Lymphocytes Relative: 14 %
Lymphs Abs: 1 10*3/uL (ref 0.7–4.0)
MCH: 30.7 pg (ref 26.0–34.0)
MCHC: 33.8 g/dL (ref 30.0–36.0)
MCV: 90.9 fL (ref 80.0–100.0)
Monocytes Absolute: 1.4 10*3/uL — ABNORMAL HIGH (ref 0.1–1.0)
Monocytes Relative: 19 %
Neutro Abs: 4.9 10*3/uL (ref 1.7–7.7)
Neutrophils Relative %: 66 %
Platelets: 123 10*3/uL — ABNORMAL LOW (ref 150–400)
RBC: 3.84 MIL/uL — ABNORMAL LOW (ref 3.87–5.11)
RDW: 19 % — ABNORMAL HIGH (ref 11.5–15.5)
WBC: 7.5 10*3/uL (ref 4.0–10.5)
nRBC: 0 % (ref 0.0–0.2)

## 2021-03-21 LAB — CK: Total CK: 76 U/L (ref 38–234)

## 2021-03-21 MED ORDER — SODIUM CHLORIDE 0.9 % IV SOLN: INTRAVENOUS | Status: DC

## 2021-03-21 NOTE — ED Provider Notes (Signed)
[facility] EMERGENCY DEPARTMENT Provider Note   CSN: [id number] Arrival date & time: 03/21/21  1842     History Chief Complaint  Patient presents with  . Allergic Reaction    [name] is a 87 y.o. female.  HPI   Patient was brought in by family because of unsteadiness and tremors.  Patient was diagnosed with COVID 10 days ago.  She was treated with ivermectin.  Patient followed up with her doctor and was then diagnosed with bronchitis.  She was treated with doxycycline and then switched to Levaquin on Monday.  Since that time the patient has had issues with tremulousness.  She is more unsteady with walking.  She is not had any fevers.  No shortness of breath.  No vomiting or diarrhea.  Past Medical History:  Diagnosis Date  . Cancer ([place]) 06/2018   right breast cancer  . GERD (gastroesophageal reflux disease)    occasional   . Hyperlipidemia   . Hypertension    clearance with note Dr Nevada Crane on chart  . Pneumonia    2 years ago/ states occ cough with sinus drainage- no fever  . Thyroid nodule    with biopsy- states following medically    Patient Active Problem List   Diagnosis Date Noted  . Hepatic cirrhosis ([place])   . Encephalopathy, hepatic ([place]) 10/08/2020  . Aortic valvar stenosis 10/08/2020  . GI bleed 10/07/2020  . AKI (acute kidney injury) ([place]) 10/07/2020  . Near syncope 10/07/2020  . Moderate protein-calorie malnutrition ([place]) 10/07/2020  . Leukocytosis 10/07/2020  . Goals of care, counseling/discussion 02/15/2019  . Malignant neoplasm of upper-outer quadrant of right breast in female, estrogen receptor negative ([place]) 07/15/2018  . Peripheral polyneuropathy 12/12/2017  . Paresthesia 11/24/2017  . Symptomatic anemia 12/19/2011  . Postop Hyponatremia 12/17/2011  . OA (osteoarthritis) of knee 12/16/2011  . KNEE, ARTHRITIS, DEGEN./OSTEO 10/10/2010  . TEAR MEDIAL MENISCUS 10/10/2010  . TEAR LATERAL MENISCUS 10/10/2010    Past Surgical History:  Procedure  Laterality Date  . CATARACT EXTRACTION W/PHACO  11/30/2012   Procedure: CATARACT EXTRACTION PHACO AND INTRAOCULAR LENS PLACEMENT (IOC);  Surgeon: Williams Che, MD;  Location: [facility];  Service: Ophthalmology;  Laterality: Left;  CDE:11.49  . CATARACT EXTRACTION W/PHACO Right 03/15/2013   Procedure: CATARACT EXTRACTION PHACO AND INTRAOCULAR LENS PLACEMENT (IOC);  Surgeon: Williams Che, MD;  Location: [facility];  Service: Ophthalmology;  Laterality: Right;  CDE 16.15  . COLONOSCOPY    . ESOPHAGOGASTRODUODENOSCOPY (EGD) WITH PROPOFOL N/A 10/07/2020   Dr. 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  Distal esophageal erosion/excoriations overlying varices.  More consistent with trauma/reflux than variceal bleeding stigmata.  Stomach incompletely seen due to presence of old blood.  Moderate sized hiatal hernia.  4 mm antral erosion, innocent appearing.  Repeat EGD in 2 days.  . ESOPHAGOGASTRODUODENOSCOPY (EGD) WITH PROPOFOL N/A 10/11/2020   Dr. Laural Golden: Grade 1 varices in the distal esophagus.  LA grade B esophagitis, 1 benign-appearing intrinsic mild stenosis at 34 cm from the incisor, 4 cm hiatal hernia, single erosion in the gastric antrum and prepyloric region.  Marland Kitchen EXCISION OF SKIN TAG Right 06/17/2016   Procedure: SHAVE OF SKIN TAG RIGHT BUTTOCK;  Surgeon: Aviva Signs, MD;  Location: [facility];  Service: General;  Laterality: Right;  . JOINT REPLACEMENT     left knee  . MASS EXCISION Left 06/17/2016   Procedure: EXCISION SKIN MALIGNANT LESION LEFT BUTTOCK;  Surgeon: Aviva Signs, MD;  Location: [facility];  Service: General;  Laterality: Left;  . PORTACATH PLACEMENT Right 07/28/2018   Procedure: INSERTION PORT-A-CATH WITH ULTRASOUND;  Surgeon: Rolm Bookbinder, MD;  Location: [facility];  Service: General;  Laterality: Right;  . PUNCH BIOPSY OF SKIN Right 07/28/2018   Procedure: PUNCH BIOPSY OF SKIN RIGHT BREAST;  Surgeon: Rolm Bookbinder, MD;   Location: [facility];  Service: General;  Laterality: Right;  . TOTAL KNEE ARTHROPLASTY  12/16/2011   Procedure: TOTAL KNEE ARTHROPLASTY;  Surgeon: Gearlean Alf, MD;  Location: [facility];  Service: Orthopedics;  Laterality: Right;  . TUBAL LIGATION       OB History   No obstetric history on file.     Family History  Problem Relation Age of Onset  . Heart attack Mother   . Bronchitis Father   . Diabetes Sister   . Leukemia Brother   . Lung disease Brother   . Lung disease Sister     Social History   Tobacco Use  . Smoking status: Never Smoker  . Smokeless tobacco: Never Used  Vaping Use  . Vaping Use: Never used  Substance Use Topics  . Alcohol use: No  . Drug use: No    Home Medications Prior to Admission medications   Medication Sig Start Date End Date Taking? Authorizing Provider  aspirin EC 81 MG tablet Take 1 tablet (81 mg total) by mouth daily. Swallow whole. 10/16/20 10/16/21  Nita Sells, MD  carbamide peroxide (DEBROX) 6.5 % OTIC solution Place 5 drops into both ears 2 (two) times daily. 10/12/20   Nita Sells, MD  hydrocortisone 1 % ointment Apply 1 application topically 2 (two) times daily. Patient taking differently: Apply 1 application topically as needed. 09/18/18   Lockamy, Randi L, NP-C  lactulose (CHRONULAC) 10 GM/15ML solution Take 30 mLs (20 g total) by mouth 2 (two) times daily. Patient taking differently: Take 20 g by mouth as needed. 10/12/20   Nita Sells, MD  Multiple Vitamin (MULTIVITAMIN WITH MINERALS) TABS tablet Take 1 tablet by mouth daily.    [provider]  nadolol (CORGARD) 20 MG tablet Take 1 tablet (20 mg total) by mouth daily. 10/13/20   Nita Sells, MD  [facility] ULTRA test strip  02/06/21   [provider]  pantoprazole (PROTONIX) 40 MG tablet Take 1 tablet (40 mg total) by mouth 2 (two) times daily before a meal. Patient taking differently: Take 40 mg by mouth daily.  10/12/20   Nita Sells, MD    Allergies    Penicillins  Review of Systems   Review of Systems  All other systems reviewed and are negative.   Physical Exam Updated Vital Signs BP (!) 149/65   Pulse (!) 59   Temp 98.5 F (36.9 C) (Oral)   Resp 17   Ht 1.727 m (5\' 8" )   Wt 74.8 kg   SpO2 100%   BMI 25.09 kg/m   Physical Exam Vitals and nursing note reviewed.  Constitutional:      General: She is not in acute distress.    Appearance: She is well-developed.  HENT:     Head: Normocephalic and atraumatic.     Right Ear: External ear normal.     Left Ear: External ear normal.  Eyes:     General: No scleral icterus.       Right eye: No discharge.        Left eye: No discharge.     Conjunctiva/sclera: Conjunctivae normal.  Neck:     Trachea: No tracheal deviation.  Cardiovascular:     Rate and Rhythm: Normal rate and regular rhythm.  Pulmonary:     Effort: Pulmonary effort is normal. No respiratory distress.     Breath sounds: Normal breath sounds. No stridor. No wheezing or rales.  Abdominal:     General: Bowel sounds are normal. There is no distension.     Palpations: Abdomen is soft.     Tenderness: There is no abdominal tenderness. There is no guarding or rebound.  Musculoskeletal:        General: No tenderness.     Cervical back: Neck supple.  Skin:    General: Skin is warm and dry.     Findings: No rash.  Neurological:     Mental Status: She is alert.     Cranial Nerves: No cranial nerve deficit (no facial droop, extraocular movements intact, no slurred speech).     Sensory: No sensory deficit.     Motor: Weakness present. No abnormal muscle tone or seizure activity.     Coordination: Coordination normal.     Comments: Mild tremor, patient's able to lift both arms off the bed without difficulty, more difficulty lifting her legs off the bed     ED Results / Procedures / Treatments   Labs (all labs ordered are listed, but only abnormal results  are displayed) Labs Reviewed  CBC WITH DIFFERENTIAL/PLATELET - Abnormal; Notable for the following components:      Result Value   RBC 3.84 (*)    Hemoglobin 11.8 (*)    HCT 34.9 (*)    RDW 19.0 (*)    Platelets 123 (*)    Monocytes Absolute 1.4 (*)    All other components within normal limits  BASIC METABOLIC PANEL - Abnormal; Notable for the following components:   CO2 19 (*)    Glucose, Bld 117 (*)    BUN 38 (*)    Creatinine, Ser 1.26 (*)    GFR, Estimated 41 (*)    All other components within normal limits  CK  URINALYSIS, ROUTINE W REFLEX MICROSCOPIC    EKG EKG Interpretation  Date/Time:  Wednesday Mar 21 2021 18:58:55 EDT Ventricular Rate:  111 PR Interval:  138 QRS Duration: 78 QT Interval:  354 QTC Calculation: 481 R Axis:   -25 Text Interpretation: Sinus tachycardia with occasional Premature ventricular complexes Left ventricular hypertrophy with repolarization abnormality ( R in aVL , Cornell product ) Abnormal ECG pvc new since last tracing Confirmed by Dorie Rank [phone]) on 03/21/2021 7:17:20 PM   Radiology CT Head Wo Contrast  Result Date: 03/21/2021 CLINICAL DATA:  87 year old female with altered mental status. EXAM: CT HEAD WITHOUT CONTRAST TECHNIQUE: Contiguous axial images were obtained from the base of the skull through the vertex without intravenous contrast. COMPARISON:  Head CT dated 10/07/2020. FINDINGS: Brain: Mild age-related atrophy and chronic microvascular ischemic changes. There is no acute intracranial hemorrhage. No mass effect or midline shift no extra-axial fluid collection. Vascular: No hyperdense vessel or unexpected calcification. Skull: Normal. Negative for fracture or focal lesion. Sinuses/Orbits: No acute finding. Other: None IMPRESSION: 1. No acute intracranial pathology. 2. Mild age-related atrophy and chronic microvascular ischemic changes. Electronically Signed   By: Anner Crete M.D.   On: 03/21/2021 22:17   DG Chest Portable 1  View  Result Date: 03/21/2021 CLINICAL DATA:  Cough.  Recent COVID. EXAM: PORTABLE CHEST 1 VIEW COMPARISON:  Radiograph 07/28/2018, PET CT 11/27/2020 FINDINGS: Right chest  port in place. Stable upper normal heart size. Unchanged mediastinal contours with aortic atherosclerosis. No acute airspace disease. No pulmonary edema or pleural fluid. No pneumothorax. No acute osseous abnormalities are seen. IMPRESSION: No acute abnormality. Aortic Atherosclerosis (ICD10-I70.0). Electronically Signed   By: Keith Rake M.D.   On: 03/21/2021 22:15    Procedures Procedures   Medications Ordered in ED Medications  0.9 %  sodium chloride infusion ( Intravenous New Bag/Given 03/21/21 2149)    ED Course  I have reviewed the triage vital signs and the nursing notes.  Pertinent labs & imaging results that were available during my care of the patient were reviewed by me and considered in my medical decision making (see chart for details).  Clinical Course as of 03/22/21 0000  Wed Mar 21, 2021  2329 CT scan without acute findings [JK]  2329 Chest x-ray without acute abnormalities [JK]  2329 CBC shows stable anemia.  Metabolic panel shows stable renal sufficiency [JK]    Clinical Course User Index [JK] Dorie Rank, MD   MDM Rules/Calculators/A&P                         Patient presented to the ED for evaluation of tremulousness and some instability.  Patient was diagnosed with COVID 10 days ago.  She was also started on doxycycline and Levaquin by her primary care doctor.  Patient's ED work-up is reassuring.  No signs of significant electrolyte abnormalities.  She is not anemic.  Chest x-ray does not show evidence of pneumonia.  She is not requiring oxygen.  Head CT did not show any acute findings.  Suspect patient's symptoms are most likely related to her levaquin.  No clear indication for her to continue that medication.    Final Clinical Impression(s) / ED Diagnoses Final diagnoses:  Adverse effect  of drug, initial encounter  Tremor    Rx / DC Orders ED Discharge Orders    None       Dorie Rank, MD 03/22/21 0000

## 2021-03-21 NOTE — Discharge Instructions (Addendum)
Stop taking the doxycycline and Levaquin as that could be contributing to some of the tremors and unsteadiness.  Otherwise continue your current medications.  Follow-up with your primary care doctor to be rechecked

## 2021-03-21 NOTE — ED Triage Notes (Signed)
Family says pt diagnosed with covid 10 days ago and was also diagnosed with bronchitis.  Reports was put on doxycycline and  was switched to  levaquin  Monday.  Reports Very unstable on her feet and having tremors.  Denies any rash, difficulty breathing, or swallowing.  Pt very HOH.

## 2021-03-21 NOTE — ED Notes (Signed)
Pt was max assist from wheelchair to bed.

## 2021-04-12 ENCOUNTER — Other Ambulatory Visit: Payer: Self-pay

## 2021-04-12 ENCOUNTER — Ambulatory Visit (HOSPITAL_COMMUNITY)
Admission: RE | Admit: 2021-04-12 | Discharge: 2021-04-12 | Disposition: A | Discharge status: Home / Self Care | Payer: Medicare HMO | Source: Ambulatory Visit | Attending: Hematology | Admitting: Hematology

## 2021-04-12 DIAGNOSIS — C50411 Malignant neoplasm of upper-outer quadrant of right female breast: Secondary | ICD-10-CM | POA: Insufficient documentation

## 2021-04-12 DIAGNOSIS — I700 Atherosclerosis of aorta: Secondary | ICD-10-CM | POA: Insufficient documentation

## 2021-04-12 DIAGNOSIS — Z171 Estrogen receptor negative status [ER-]: Secondary | ICD-10-CM | POA: Insufficient documentation

## 2021-04-12 DIAGNOSIS — I2510 Atherosclerotic heart disease of native coronary artery without angina pectoris: Secondary | ICD-10-CM | POA: Insufficient documentation

## 2021-04-12 DIAGNOSIS — C50911 Malignant neoplasm of unspecified site of right female breast: Secondary | ICD-10-CM | POA: Diagnosis not present

## 2021-04-12 LAB — GLUCOSE, CAPILLARY: Glucose-Capillary: 102 mg/dL — ABNORMAL HIGH (ref 70–99)

## 2021-04-12 MED ORDER — FLUDEOXYGLUCOSE F - 18 (FDG) INJECTION
8.6000 | Freq: Once | INTRAVENOUS | Status: AC
  Administered 2021-04-12: 8.8 via INTRAVENOUS

## 2021-04-18 ENCOUNTER — Encounter (HOSPITAL_COMMUNITY): Payer: Self-pay | Admitting: Hematology and Oncology

## 2021-04-18 ENCOUNTER — Other Ambulatory Visit: Payer: Self-pay

## 2021-04-18 ENCOUNTER — Inpatient Hospital Stay (HOSPITAL_COMMUNITY): Payer: Medicare HMO | Attending: Hematology and Oncology | Admitting: Hematology and Oncology

## 2021-04-18 ENCOUNTER — Inpatient Hospital Stay (HOSPITAL_COMMUNITY): Payer: Medicare HMO

## 2021-04-18 DIAGNOSIS — E785 Hyperlipidemia, unspecified: Secondary | ICD-10-CM | POA: Insufficient documentation

## 2021-04-18 DIAGNOSIS — K449 Diaphragmatic hernia without obstruction or gangrene: Secondary | ICD-10-CM | POA: Insufficient documentation

## 2021-04-18 DIAGNOSIS — Z171 Estrogen receptor negative status [ER-]: Secondary | ICD-10-CM

## 2021-04-18 DIAGNOSIS — Z9221 Personal history of antineoplastic chemotherapy: Secondary | ICD-10-CM | POA: Insufficient documentation

## 2021-04-18 DIAGNOSIS — R531 Weakness: Secondary | ICD-10-CM | POA: Diagnosis not present

## 2021-04-18 DIAGNOSIS — Z7189 Other specified counseling: Secondary | ICD-10-CM | POA: Diagnosis not present

## 2021-04-18 DIAGNOSIS — I10 Essential (primary) hypertension: Secondary | ICD-10-CM | POA: Insufficient documentation

## 2021-04-18 DIAGNOSIS — I700 Atherosclerosis of aorta: Secondary | ICD-10-CM | POA: Insufficient documentation

## 2021-04-18 DIAGNOSIS — K7460 Unspecified cirrhosis of liver: Secondary | ICD-10-CM | POA: Insufficient documentation

## 2021-04-18 DIAGNOSIS — C50411 Malignant neoplasm of upper-outer quadrant of right female breast: Secondary | ICD-10-CM

## 2021-04-18 DIAGNOSIS — Z7982 Long term (current) use of aspirin: Secondary | ICD-10-CM | POA: Insufficient documentation

## 2021-04-18 LAB — CBC WITH DIFFERENTIAL/PLATELET
Abs Immature Granulocytes: 0.01 10*3/uL (ref 0.00–0.07)
Basophils Absolute: 0.1 10*3/uL (ref 0.0–0.1)
Basophils Relative: 1 %
Eosinophils Absolute: 0.3 10*3/uL (ref 0.0–0.5)
Eosinophils Relative: 4 %
HCT: 35.4 % — ABNORMAL LOW (ref 36.0–46.0)
Hemoglobin: 11.8 g/dL — ABNORMAL LOW (ref 12.0–15.0)
Immature Granulocytes: 0 %
Lymphocytes Relative: 32 %
Lymphs Abs: 2.5 10*3/uL (ref 0.7–4.0)
MCH: 31.5 pg (ref 26.0–34.0)
MCHC: 33.3 g/dL (ref 30.0–36.0)
MCV: 94.4 fL (ref 80.0–100.0)
Monocytes Absolute: 1 10*3/uL (ref 0.1–1.0)
Monocytes Relative: 13 %
Neutro Abs: 4 10*3/uL (ref 1.7–7.7)
Neutrophils Relative %: 50 %
Platelets: 181 10*3/uL (ref 150–400)
RBC: 3.75 MIL/uL — ABNORMAL LOW (ref 3.87–5.11)
RDW: 18.2 % — ABNORMAL HIGH (ref 11.5–15.5)
WBC: 7.9 10*3/uL (ref 4.0–10.5)
nRBC: 0 % (ref 0.0–0.2)

## 2021-04-18 LAB — COMPREHENSIVE METABOLIC PANEL
ALT: 21 U/L (ref 0–44)
AST: 40 U/L (ref 15–41)
Albumin: 3.1 g/dL — ABNORMAL LOW (ref 3.5–5.0)
Alkaline Phosphatase: 169 U/L — ABNORMAL HIGH (ref 38–126)
Anion gap: 9 (ref 5–15)
BUN: 27 mg/dL — ABNORMAL HIGH (ref 8–23)
CO2: 20 mmol/L — ABNORMAL LOW (ref 22–32)
Calcium: 9.1 mg/dL (ref 8.9–10.3)
Chloride: 109 mmol/L (ref 98–111)
Creatinine, Ser: 1.18 mg/dL — ABNORMAL HIGH (ref 0.44–1.00)
GFR, Estimated: 44 mL/min — ABNORMAL LOW (ref >60)
Glucose, Bld: 102 mg/dL — ABNORMAL HIGH (ref 70–99)
Potassium: 3.7 mmol/L (ref 3.5–5.1)
Sodium: 138 mmol/L (ref 135–145)
Total Bilirubin: 0.8 mg/dL (ref 0.3–1.2)
Total Protein: 6.6 g/dL (ref 6.5–8.1)

## 2021-04-18 LAB — MAGNESIUM: Magnesium: 1.9 mg/dL (ref 1.7–2.4)

## 2021-04-18 NOTE — Progress Notes (Signed)
[place] progress notes  Patient Care Team: [name], MD as PCP - General (Internal Medicine) Adrienne Sine, MD as Consulting Physician (Dermatology) Adrienne Romney Cristopher Estimable, MD as Consulting Physician (Gastroenterology)  CHIEF COMPLAINTS/PURPOSE OF VISIT:  Locally advanced right breast cancer, for further management  HISTORY OF PRESENTING ILLNESS:  [name] 87 y.o. female is seen because her primary oncologist is not available Her husband is present throughout the visit and provided most of the history I have reviewed her records extensively She had recent PET CT scan performed. In comparison to her prior PET CT scan, she have signs of recurrence and active disease on the right breast According to her husband, he has noted a new lump on the right breast She is very weak She had received recent blood transfusion due to GI bleed secondary to varices She also have history of hepatic encephalopathy but now appears to have compensated liver failure She is established with home-based palliative care She denies pain or discomfort  I reviewed the patient's records extensive and collaborated the history with the patient. Summary of her history is as follows: Oncology History  Malignant neoplasm of upper-outer quadrant of right breast in female, estrogen receptor negative ([place])  07/03/2018 Initial Diagnosis   Palpable right breast mass for several months with skin discoloration, clinically skin trabecular thickening with nipple retraction measuring 7.2 x 4.3 x 4.2 cm, additional mass 4 o'clock position 2.6 cm, right axillary tail 1.2 cm right axillary lymph node 1.8 cm left breast irregular mass 1 cm, adjacent mass 0.9 cm together measuring 1.9 cm, no lymphadenopathy    07/03/2018 Pathology Results   Right breast biopsy: IDC grade 2, ER 0%, PR 0%, HER-2 positive, Ki-67 40%, lymph node biopsy negative, left breast biopsy complex sclerosing lesion     07/15/2018  Cancer Staging   Staging form: Breast, AJCC 8th Edition - Clinical: Stage IIB (cT3, cN0, cM0, G2, ER-, PR-, HER2+) - Signed by [name], MD on 07/15/2018    07/31/2018 - 10/30/2018 Chemotherapy   The patient had trastuzumab (HERCEPTIN) 336 mg in sodium chloride 0.9 % 250 mL chemo infusion, 4 mg/kg = 336 mg, Intravenous,  Once, 4 of 4 cycles Administration: 336 mg (07/31/2018), 168 mg (08/07/2018), 168 mg (08/28/2018), 168 mg (08/14/2018), 168 mg (08/21/2018), 168 mg (09/04/2018), 168 mg (09/11/2018), 168 mg (09/18/2018), 168 mg (10/02/2018), 168 mg (10/09/2018), 168 mg (10/16/2018), 168 mg (10/23/2018), 168 mg (10/30/2018) PACLitaxel (TAXOL) 78 mg in sodium chloride 0.9 % 250 mL chemo infusion (</= 74m/m2), 40 mg/m2 = 78 mg (50 % of original dose 80 mg/m2), Intravenous,  Once, 4 of 4 cycles Dose modification: 40 mg/m2 (50 % of original dose 80 mg/m2, Cycle 1, Reason: Patient Age), 53.3333 mg/m2 (66.7 % of original dose 80 mg/m2, Cycle 1, Reason: Provider Judgment), 45 mg/m2 (original dose 80 mg/m2, Cycle 3, Reason: Provider Judgment) Administration: 78 mg (07/31/2018), 102 mg (08/07/2018), 102 mg (08/28/2018), 102 mg (08/14/2018), 102 mg (08/21/2018), 102 mg (09/04/2018), 102 mg (09/18/2018), 84 mg (10/02/2018), 84 mg (10/09/2018), 84 mg (10/16/2018), 84 mg (10/23/2018), 84 mg (10/30/2018)   for chemotherapy treatment.     11/13/2018 - 01/25/2019 Chemotherapy   The patient had trastuzumab (HERCEPTIN) 504 mg in sodium chloride 0.9 % 250 mL chemo infusion, 6 mg/kg = 504 mg (100 % of original dose 6 mg/kg), Intravenous,  Once, 4 of 5 cycles Dose modification: 6 mg/kg (original dose 6 mg/kg, Cycle 1, Reason: Other (see comments), Comment: pt receving  it ) Administration: 504 mg (11/13/2018), 504 mg (12/04/2018), 504 mg (01/04/2019), 504 mg (01/25/2019)   for chemotherapy treatment.     02/15/2019 - 04/06/2020 Chemotherapy   The patient had ado-trastuzumab emtansine (KADCYLA) 240 mg in sodium chloride 0.9 % 250 mL  chemo infusion, 3 mg/kg = 240 mg (100 % of original dose 3 mg/kg), Intravenous, Once, 17 of 18 cycles Dose modification: 3 mg/kg (original dose 3 mg/kg, Cycle 1, Reason: Provider Judgment), 3 mg/kg (original dose 3 mg/kg, Cycle 2, Reason: Provider Judgment), 2.4 mg/kg (original dose 3 mg/kg, Cycle 11, Reason: Other (see comments), Comment: bili upper limit of normal), 2.4 mg/kg (original dose 3 mg/kg, Cycle 12, Reason: Other (see comments), Comment: elevated bilirubin) Administration: 240 mg (02/15/2019), 240 mg (03/08/2019), 240 mg (05/10/2019), 240 mg (03/29/2019), 240 mg (04/19/2019), 240 mg (06/01/2019), 240 mg (06/22/2019), 240 mg (07/20/2019), 240 mg (08/10/2019), 240 mg (09/01/2019), 200 mg (09/22/2019), 180 mg (11/03/2019), 180 mg (12/09/2019), 180 mg (01/06/2020), 180 mg (02/03/2020), 180 mg (03/09/2020), 180 mg (04/06/2020)   for chemotherapy treatment.     05/15/2020 -  Chemotherapy   The patient had dexamethasone (DECADRON) 4 MG tablet, 8 mg, Oral, Daily, 1 of 1 cycle, Start date: --, End date: -- palonosetron (ALOXI) injection 0.25 mg, 0.25 mg, Intravenous,  Once, 7 of 9 cycles Administration: 0.25 mg (05/15/2020), 0.25 mg (06/05/2020), 0.25 mg (08/24/2020), 0.25 mg (09/14/2020), 0.25 mg (06/26/2020), 0.25 mg (07/17/2020), 0.25 mg (10/05/2020) fam-trastuzumab deruxtecan-nxki (ENHERTU) 226 mg in dextrose 5 % 100 mL chemo infusion, 3.2 mg/kg = 226 mg (59.3 % of original dose 5.4 mg/kg), Intravenous,  Once, 7 of 9 cycles Dose modification: 3.2 mg/kg (original dose 5.4 mg/kg, Cycle 1, Reason: Provider Judgment), 4.4 mg/kg (original dose 5.4 mg/kg, Cycle 2, Reason: Provider Judgment), 4.4 mg/kg (original dose 5.4 mg/kg, Cycle 5, Reason: Provider Judgment), 3.2 mg/kg (original dose 5.4 mg/kg, Cycle 5, Reason: Provider Judgment), 3.2 mg/kg (original dose 5.4 mg/kg, Cycle 6, Reason: Provider Judgment), 4.4 mg/kg (original dose 5.4 mg/kg, Cycle 3, Reason: Provider Judgment), 200 mg (original dose 5.4 mg/kg, Cycle 5, Reason:  Other (see comments), Comment: all pharmacy has on hand) Administration: 226 mg (05/15/2020), 312 mg (06/05/2020), 226 mg (09/14/2020), 312 mg (06/26/2020), 312 mg (07/17/2020), 200 mg (08/24/2020), 226 mg (10/05/2020)   for chemotherapy treatment.       MEDICAL HISTORY:  Past Medical History:  Diagnosis Date   Cancer ([place]) 06/2018   right breast cancer   GERD (gastroesophageal reflux disease)    occasional    Hyperlipidemia    Hypertension    clearance with note Dr Nevada Crane on chart   Pneumonia    2 years ago/ states occ cough with sinus drainage- no fever   Thyroid nodule    with biopsy- states following medically    SURGICAL HISTORY: Past Surgical History:  Procedure Laterality Date   CATARACT EXTRACTION W/PHACO  11/30/2012   Procedure: CATARACT EXTRACTION PHACO AND INTRAOCULAR LENS PLACEMENT ([place]);  Surgeon: Williams Che, MD;  Location: [facility];  Service: Ophthalmology;  Laterality: Left;  CDE:11.49   CATARACT EXTRACTION W/PHACO Right 03/15/2013   Procedure: CATARACT EXTRACTION PHACO AND INTRAOCULAR LENS PLACEMENT ([facility]);  Surgeon: Williams Che, MD;  Location: [facility];  Service: Ophthalmology;  Laterality: Right;  CDE 16.15   COLONOSCOPY     ESOPHAGOGASTRODUODENOSCOPY (EGD) WITH PROPOFOL N/A 10/07/2020   Dr. 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  Distal esophageal erosion/excoriations overlying varices.  More consistent with trauma/reflux  than variceal bleeding stigmata.  Stomach incompletely seen due to presence of old blood.  Moderate sized hiatal hernia.  4 mm antral erosion, innocent appearing.  Repeat EGD in 2 days.   ESOPHAGOGASTRODUODENOSCOPY (EGD) WITH PROPOFOL N/A 10/11/2020   Dr. Laural Golden: Grade 1 varices in the distal esophagus.  LA grade B esophagitis, 1 benign-appearing intrinsic mild stenosis at 34 cm from the incisor, 4 cm hiatal hernia, single erosion in the gastric antrum and prepyloric region.   EXCISION OF SKIN TAG Right  06/17/2016   Procedure: SHAVE OF SKIN TAG RIGHT BUTTOCK;  Surgeon: Aviva Signs, MD;  Location: [facility];  Service: General;  Laterality: Right;   JOINT REPLACEMENT     left knee   MASS EXCISION Left 06/17/2016   Procedure: EXCISION SKIN MALIGNANT LESION LEFT BUTTOCK;  Surgeon: Aviva Signs, MD;  Location: [facility];  Service: General;  Laterality: Left;   PORTACATH PLACEMENT Right 07/28/2018   Procedure: INSERTION PORT-A-CATH WITH ULTRASOUND;  Surgeon: Rolm Bookbinder, MD;  Location: [facility];  Service: General;  Laterality: Right;   PUNCH BIOPSY OF SKIN Right 07/28/2018   Procedure: PUNCH BIOPSY OF SKIN RIGHT BREAST;  Surgeon: Rolm Bookbinder, MD;  Location: [facility];  Service: General;  Laterality: Right;   TOTAL KNEE ARTHROPLASTY  12/16/2011   Procedure: TOTAL KNEE ARTHROPLASTY;  Surgeon: Gearlean Alf, MD;  Location: [facility];  Service: Orthopedics;  Laterality: Right;   TUBAL LIGATION      SOCIAL HISTORY: Social History   Socioeconomic History   Marital status: Married    Spouse name: Not on file   Number of children: 2   Years of education: some business school after HS   Highest education level: Not on file  Occupational History   Occupation: Cabin crew   Occupation: Network engineer   Tobacco Use   Smoking status: Never   Smokeless tobacco: Never  Vaping Use   Vaping Use: Never used  Substance and Sexual Activity   Alcohol use: No   Drug use: No   Sexual activity: Yes    Birth control/protection: None  Other Topics Concern   Not on file  Social History Narrative   Lives at home with her husband.   4 cups caffeine per day.   Right-handed.   Social Determinants of Health   Financial Resource Strain: Low Risk    Difficulty of Paying Living Expenses: Not hard at all  Food Insecurity: No Food Insecurity   Worried About Charity fundraiser in the Last Year: Never true   [place] in the Last Year: Never true  Transportation Needs: No  Transportation Needs   Lack of Transportation (Medical): No   Lack of Transportation (Non-Medical): No  Physical Activity: Not on file  Stress: Not on file  Social Connections: Not on file  Intimate Partner Violence: Not At Risk   Fear of Current or Ex-Partner: No   Emotionally Abused: No   Physically Abused: No   Sexually Abused: No    FAMILY HISTORY: Family History  Problem Relation Age of Onset   Heart attack Mother    Bronchitis Father    Diabetes Sister    Leukemia Brother    Lung disease Brother    Lung disease Sister     ALLERGIES:  is allergic to penicillins.  MEDICATIONS:  Current Outpatient Medications  Medication Sig Dispense Refill   aspirin EC 81 MG tablet Take 1 tablet (81 mg total) by mouth daily. Swallow whole.  30 tablet 11   carbamide peroxide (DEBROX) 6.5 % OTIC solution Place 5 drops into both ears 2 (two) times daily. 15 mL 0   hydrocortisone 1 % ointment Apply 1 application topically 2 (two) times daily. (Patient taking differently: Apply 1 application topically as needed.) 60 g 0   lactulose (CHRONULAC) 10 GM/15ML solution Take 30 mLs (20 g total) by mouth 2 (two) times daily. (Patient taking differently: Take 20 g by mouth as needed.) 236 mL 0   Multiple Vitamin (MULTIVITAMIN WITH MINERALS) TABS tablet Take 1 tablet by mouth daily.     nadolol (CORGARD) 20 MG tablet Take 1 tablet (20 mg total) by mouth daily. 30 tablet 11   ONETOUCH ULTRA test strip      pantoprazole (PROTONIX) 40 MG tablet Take 1 tablet (40 mg total) by mouth 2 (two) times daily before a meal. (Patient taking differently: Take 40 mg by mouth daily.) 60 tablet 11   No current facility-administered medications for this visit.   Facility-Administered Medications Ordered in Other Visits  Medication Dose Route Frequency Provider Last Rate Last Admin   sodium chloride flush (NS) 0.9 % injection 10 mL  10 mL Intravenous PRN Derek Jack, MD   10 mL at 05/23/20 1025   sodium chloride  flush (NS) 0.9 % injection 10 mL  10 mL Intravenous PRN Derek Jack, MD   10 mL at 05/23/20 0900    REVIEW OF SYSTEMS:   Constitutional: Denies fevers, chills or abnormal night sweats Eyes: Denies blurriness of vision, double vision or watery eyes Ears, nose, mouth, throat, and face: Denies mucositis or sore throat Respiratory: Denies cough, dyspnea or wheezes Cardiovascular: Denies palpitation, chest discomfort or lower extremity swelling Gastrointestinal:  Denies nausea, heartburn or change in bowel habits Skin: Denies abnormal skin rashes Lymphatics: Denies new lymphadenopathy or easy bruising Neurological:Denies numbness, tingling or new weaknesses Behavioral/Psych: Mood is stable, no new changes  All other systems were reviewed with the patient and are negative.  PHYSICAL EXAMINATION: ECOG PERFORMANCE STATUS: 2 - Symptomatic, <50% confined to bed  Vitals:   04/18/21 1414  BP: (!) 176/95  Pulse: 88  Resp: 18  Temp: (!) 96.8 F (36 C)  SpO2: 100%   Filed Weights   04/18/21 1414  Weight: 169 lb 8.5 oz (76.9 kg)    GENERAL:alert, no distress and comfortable.  She looks frail.  Limited examination is performed while the patient is sitting on her chair SKIN: Noted abnormal skin thickening on the right breast.  There is palpable right breast nodule approximately 12 o'clock position 3 cm away from the nipple, measures approximately 3 cm in size NEURO: no focal motor/sensory deficits  LABORATORY DATA:  I have reviewed the data as listed Lab Results  Component Value Date   WBC 7.9 04/18/2021   HGB 11.8 (L) 04/18/2021   HCT 35.4 (L) 04/18/2021   MCV 94.4 04/18/2021   PLT 181 04/18/2021   Recent Labs    06/05/20 0939 06/26/20 1230 07/17/20 1205 08/08/20 1204 11/30/20 0830 02/07/21 0958 03/21/21 2146 04/18/21 1333  NA 136 138 137   < > 137 138 135 138  K 3.5 4.0 3.7   < > 4.0 3.5 3.5 3.7  CL 107 109 108   < > 107 105 108 109  CO2 19* 19* 20*   < > 24 23  19* 20*  GLUCOSE 90 122* 93   < > 110* 92 117* 102*  BUN 24* 26* 21   < >  28* 27* 38* 27*  CREATININE 1.11* 1.19* 1.04*   < > 1.31* 1.43* 1.26* 1.18*  CALCIUM 8.4* 9.4 8.7*   < > 9.4 9.2 8.9 9.1  GFRNONAA 44* 41* 48*   < > 39* 35* 41* 44*  GFRAA 51* 47* 56*  --   --   --   --   --   PROT 6.2* 5.9* 6.2*   < > 6.5 6.7  --  6.6  ALBUMIN 2.8* 2.7* 2.8*   < > 3.0* 3.2*  --  3.1*  AST 44* 52* 51*   < > 76* 47*  --  40  ALT 21 24 26    < > 35 23  --  21  ALKPHOS 112 120 138*   < > 166* 167*  --  169*  BILITOT 1.1 1.1 1.2   < > 1.2 1.3*  --  0.8   < > = values in this interval not displayed.    RADIOGRAPHIC STUDIES: I have reviewed PET CT scan with the patient and her husband I have personally reviewed the radiological images as listed and agreed with the findings in the report. CT Head Wo Contrast  Result Date: 03/21/2021 CLINICAL DATA:  87 year old female with altered mental status. EXAM: CT HEAD WITHOUT CONTRAST TECHNIQUE: Contiguous axial images were obtained from the base of the skull through the vertex without intravenous contrast. COMPARISON:  Head CT dated 10/07/2020. FINDINGS: Brain: Mild age-related atrophy and chronic microvascular ischemic changes. There is no acute intracranial hemorrhage. No mass effect or midline shift no extra-axial fluid collection. Vascular: No hyperdense vessel or unexpected calcification. Skull: Normal. Negative for fracture or focal lesion. Sinuses/Orbits: No acute finding. Other: None IMPRESSION: 1. No acute intracranial pathology. 2. Mild age-related atrophy and chronic microvascular ischemic changes. Electronically Signed   By: Anner Crete M.D.   On: 03/21/2021 22:17   NM PET Image Restag (PS) Skull Base To Thigh  Result Date: 04/13/2021 CLINICAL DATA:  Subsequent treatment strategy for right breast cancer. EXAM: NUCLEAR MEDICINE PET SKULL BASE TO THIGH TECHNIQUE: 8.8 mCi F-18 FDG was injected intravenously. Full-ring PET imaging was performed from the  skull base to thigh after the radiotracer. CT data was obtained and used for attenuation correction and anatomic localization. Fasting blood glucose: 102 mg/dl COMPARISON:  PET-CT dated 11/27/2020 FINDINGS: Mediastinal blood pool activity: SUV max 2.3 Liver activity: SUV max NA NECK: No metabolic cervical lymphadenopathy. Mild abdominal is along the right vocal fold, max SUV 4.7, likely physiologic. Incidental CT findings: none CHEST: Residual 2.7 cm upper/central right breast mass (series 4/image 37), max SUV 15.1, previously 5.2. No hypermetabolic thoracic lymphadenopathy. No suspicious pulmonary nodules. Right chest port terminates in the lower SVC. Incidental CT findings: Atherosclerotic calcifications of the aortic root/arch. Three vessel coronary atherosclerosis. ABDOMEN/PELVIS: No abnormal hypermetabolism in the liver, spleen, pancreas, or adrenal glands. No hypermetabolic abdominopelvic lymphadenopathy. Incidental CT findings: Small hiatal hernia. Tiny layering gallstones (series 4/image 98). Atherosclerotic calcifications the abdominal aorta and branch vessels. Fusiform ectasia of the infrarenal abdominal aorta measuring 2.6 cm (series 4/image 126). Sigmoid diverticulosis, without evidence of diverticulitis. SKELETON: Mild hypermetabolism along the medial left clavicular head, max SUV 4.0, favored to reflect degenerative changes. Additional mild/vague hypermetabolism at the left glenohumeral joint, max SUV 3.5, also favored to be degenerative/physiologic. No focal hypermetabolic activity to suggest skeletal metastases. Incidental CT findings: Degenerative changes of the visualized thoracolumbar spine. IMPRESSION: Persistent right breast mass with increased hypermetabolism, corresponding to the patient's known primary bronchogenic neoplasm.  No findings suspicious for metastatic disease. Additional ancillary findings as above. Electronically Signed   By: Julian Hy M.D.   On: 04/13/2021 08:47   DG  Chest Portable 1 View  Result Date: 03/21/2021 CLINICAL DATA:  Cough.  Recent COVID. EXAM: PORTABLE CHEST 1 VIEW COMPARISON:  Radiograph 07/28/2018, PET CT 11/27/2020 FINDINGS: Right chest port in place. Stable upper normal heart size. Unchanged mediastinal contours with aortic atherosclerosis. No acute airspace disease. No pulmonary edema or pleural fluid. No pneumothorax. No acute osseous abnormalities are seen. IMPRESSION: No acute abnormality. Aortic Atherosclerosis (ICD10-I70.0). Electronically Signed   By: Keith Rake M.D.   On: 03/21/2021 22:15    ASSESSMENT & PLAN:  Malignant neoplasm of upper-outer quadrant of right breast in female, estrogen receptor negative ([place]) I have reviewed PET CT imaging studies with the patient and her husband Both the patient and husband has made informed decision not to pursue further palliative systemic chemotherapy She is already established with home-based palliative care At the end of the visit, her husband is wondering about the role of radiation treatment She is relatively asymptomatic from the disease recurrence but left untreated, that breast lesion could cause potential issues in the future However, this has to be balanced with the risks and side effects of radiation treatment At the end of today's visit, she is undecided I will bring her case to Dr. Tomie China attention and advise her husband that he will call him when he returns to work for further plan of care  Hepatic cirrhosis ([place]) She is currently not encephalopathic I recommend her not to pursue any form of complementary medicine because of risk of liver failure  Goals of care, counseling/discussion She is weak with poor performance status She had multiple comorbidities She is already established with home-based palliative care I recommend consideration for full palliative care with hospice program We discussed CODE STATUS She has advanced directives and living will Depending on  the circumstances, she may or may not want to be resuscitated; she is clear that she does not want to be resuscitated in the event of terminal illness but will consider to be resuscitated on the case if the condition is treatable Ultimately, as she is not clear whether she wants to pursue palliative radiation or not I will get Dr. [name] to call her for further discussion about next step I have not made a return appointment for her to come back  No orders of the defined types were placed in this encounter.   All questions were answered. The patient knows to call the clinic with any problems, questions or concerns. The total time spent in the appointment was 30 minutes encounter with patients including review of chart and various tests results, discussions about plan of care and coordination of care plan   Heath Lark, MD 04/18/2021 4:56 PM

## 2021-04-18 NOTE — Assessment & Plan Note (Signed)
She is currently not encephalopathic I recommend her not to pursue any form of complementary medicine because of risk of liver failure

## 2021-04-18 NOTE — Assessment & Plan Note (Signed)
I have reviewed PET CT imaging studies with the patient and her husband Both the patient and husband has made informed decision not to pursue further palliative systemic chemotherapy She is already established with home-based palliative care At the end of the visit, her husband is wondering about the role of radiation treatment She is relatively asymptomatic from the disease recurrence but left untreated, that breast lesion could cause potential issues in the future However, this has to be balanced with the risks and side effects of radiation treatment At the end of today's visit, she is undecided I will bring her case to Dr. Tomie China attention and advise her husband that he will call him when he returns to work for further plan of care

## 2021-04-18 NOTE — Assessment & Plan Note (Signed)
She is weak with poor performance status She had multiple comorbidities She is already established with home-based palliative care I recommend consideration for full palliative care with hospice program We discussed CODE STATUS She has advanced directives and living will Depending on the circumstances, she may or may not want to be resuscitated; she is clear that she does not want to be resuscitated in the event of terminal illness but will consider to be resuscitated on the case if the condition is treatable Ultimately, as she is not clear whether she wants to pursue palliative radiation or not I will get Dr. [name] to call her for further discussion about next step I have not made a return appointment for her to come back

## 2021-04-19 ENCOUNTER — Encounter (HOSPITAL_COMMUNITY): Payer: Self-pay | Admitting: Lab

## 2021-04-19 LAB — CANCER ANTIGEN 27.29: CA 27.29: 15.6 U/mL (ref 0.0–38.6)

## 2021-04-19 LAB — CANCER ANTIGEN 15-3: CA 15-3: 13 U/mL (ref 0.0–25.0)

## 2021-04-19 NOTE — Progress Notes (Unsigned)
Referral to rad onc [facility].  Records faxed on 6/23

## 2021-04-20 ENCOUNTER — Other Ambulatory Visit (HOSPITAL_COMMUNITY): Payer: Self-pay

## 2021-04-24 DIAGNOSIS — C50411 Malignant neoplasm of upper-outer quadrant of right female breast: Secondary | ICD-10-CM | POA: Diagnosis not present

## 2021-04-24 DIAGNOSIS — K7460 Unspecified cirrhosis of liver: Secondary | ICD-10-CM | POA: Diagnosis not present

## 2021-04-24 DIAGNOSIS — C7989 Secondary malignant neoplasm of other specified sites: Secondary | ICD-10-CM | POA: Diagnosis not present

## 2021-04-24 DIAGNOSIS — Z171 Estrogen receptor negative status [ER-]: Secondary | ICD-10-CM | POA: Diagnosis not present

## 2021-04-24 DIAGNOSIS — N1832 Chronic kidney disease, stage 3b: Secondary | ICD-10-CM | POA: Diagnosis not present

## 2021-04-24 DIAGNOSIS — Z515 Encounter for palliative care: Secondary | ICD-10-CM | POA: Diagnosis not present

## 2021-05-02 DIAGNOSIS — K219 Gastro-esophageal reflux disease without esophagitis: Secondary | ICD-10-CM | POA: Diagnosis not present

## 2021-05-02 DIAGNOSIS — E1165 Type 2 diabetes mellitus with hyperglycemia: Secondary | ICD-10-CM | POA: Diagnosis not present

## 2021-05-10 ENCOUNTER — Other Ambulatory Visit (HOSPITAL_COMMUNITY): Payer: Self-pay

## 2021-05-10 DIAGNOSIS — C50411 Malignant neoplasm of upper-outer quadrant of right female breast: Secondary | ICD-10-CM

## 2021-05-10 DIAGNOSIS — Z171 Estrogen receptor negative status [ER-]: Secondary | ICD-10-CM

## 2021-05-10 NOTE — Progress Notes (Signed)
Order placed for CT CAP and labs to be drawn in approximately 4 months.

## 2021-06-08 ENCOUNTER — Telehealth: Payer: Self-pay | Admitting: Gastroenterology

## 2021-06-08 NOTE — Telephone Encounter (Signed)
Patient was due for hepatoma screening 01/2021. PET reviewed, no hypermetabolic activity in liver (done for breast cancer). She is scheduled for CT chest/abd/pelvis in 08/2021. Will follow, for purpose of hepatoma screening.

## 2021-07-04 ENCOUNTER — Ambulatory Visit (INDEPENDENT_AMBULATORY_CARE_PROVIDER_SITE_OTHER): Payer: Medicare HMO | Admitting: Gastroenterology

## 2021-07-04 ENCOUNTER — Other Ambulatory Visit: Payer: Self-pay

## 2021-07-04 ENCOUNTER — Encounter: Payer: Self-pay | Admitting: Gastroenterology

## 2021-07-04 VITALS — BP 141/82 | HR 102 | Temp 96.8°F | Ht 64.0 in | Wt 176.4 lb

## 2021-07-04 DIAGNOSIS — K7460 Unspecified cirrhosis of liver: Secondary | ICD-10-CM

## 2021-07-04 MED ORDER — NADOLOL 20 MG PO TABS: 20.0000 mg | ORAL_TABLET | Freq: Every day | ORAL | 11 refills | Status: DC

## 2021-07-04 NOTE — Progress Notes (Signed)
Primary Care Physician: [name], MD  Primary Gastroenterologist:  Garfield Cornea, MD   Chief Complaint  Patient presents with   Cirrhosis    HPI: [name] is a 87 y.o. female here for follow-up of cirrhosis, GI bleeding.  Last seen in the office in March.  History of GI bleeding with melena in December 2021 during hospitalization.  She dropped her hemoglobin from 11.8 just a few weeks prior, presenting hemoglobin 6.5.  Received multiple units of packed red blood cells.  CT noted cirrhotic liver.  Initial EGD on December 11 was incomplete due to old blood throughout the stomach.  Esophageal varices noted at that time without stigmata of bleeding.  Esophagitis present.  Coexisting Mallory-Weiss tear equivalent not excluded.  Repeat EGD same admission, with grade 1 varices in the distal esophagus, LA grade B esophagitis, benign-appearing intrinsic mild stenosis of the esophagus, 4 cm hiatal hernia, erosive gastropathy.  Hospitalization was complicated by hepatic encephalopathy/near coma likely due to large protein load with upper GI bleeding.  Meld sodium of 16 while inpatient.  Continues to follow with oncology for breast cancer. No further chemotherapy was provided after 09/2020. Scans scheduled for 08/2021.  Labs in March 2022: Hepatitis A total antibody positive.  Hepatitis B surface antibody negative.  Previous viral markers negative for active hepatitis B and C, mitochondrial antibodies negative, no evidence of iron overload.  H. pylori serologies negative in December 2021.  Today: She states she feels great.  Denies any confusion or lethargy.  Husband states they stopped lactulose because she was having 5 and 6 stools a day.  Increased dietary fiber, having at least 2 stools sometimes 3/day.  Did not skip any days.  No hard stools.  No rectal bleeding or melena.  No abdominal pain, heartburn, vomiting, dysphagia.  She stopped nadolol at some point, they are not sure why.   Denies any side effects from medication.  Had COVID in May 2022.  Husband states they were able to find a provider to give her ivermectin.  Was also given doxycycline and Levaquin.  Ended up to the ED with tremulousness, unsteadiness.  Work-up including chest x-ray, head CT without acute findings.  Ammonia level not obtained.      Current Outpatient Medications  Medication Sig Dispense Refill   aspirin EC 81 MG tablet Take 1 tablet (81 mg total) by mouth daily. Swallow whole. 30 tablet 11   hydrocortisone 1 % ointment Apply 1 application topically 2 (two) times daily. (Patient taking differently: Apply 1 application topically as needed.) 60 g 0   lactulose (CHRONULAC) 10 GM/15ML solution Take 30 mLs (20 g total) by mouth 2 (two) times daily. (Patient taking differently: Take 20 g by mouth as needed.) 236 mL 0   Misc Natural Products (ESSIAC TONIC PO) Take by mouth daily.     Multiple Vitamin (MULTIVITAMIN WITH MINERALS) TABS tablet Take 1 tablet by mouth daily.     ONETOUCH ULTRA test strip      OVER THE COUNTER MEDICATION Fanbendosol once daily     pantoprazole (PROTONIX) 40 MG tablet Take 1 tablet (40 mg total) by mouth 2 (two) times daily before a meal. (Patient taking differently: Take 40 mg by mouth daily.) 60 tablet 11   carbamide peroxide (DEBROX) 6.5 % OTIC solution Place 5 drops into both ears 2 (two) times daily. (Patient not taking: Reported on 07/04/2021) 15 mL 0   nadolol (CORGARD) 20 MG tablet Take 1 tablet (  20 mg total) by mouth daily. (Patient not taking: Reported on 07/04/2021) 30 tablet 11   No current facility-administered medications for this visit.   Facility-Administered Medications Ordered in Other Visits  Medication Dose Route Frequency Provider Last Rate Last Admin   sodium chloride flush (NS) 0.9 % injection 10 mL  10 mL Intravenous PRN Derek Jack, MD   10 mL at 05/23/20 1025   sodium chloride flush (NS) 0.9 % injection 10 mL  10 mL Intravenous PRN Derek Jack, MD   10 mL at 05/23/20 0900    Allergies as of 07/04/2021 - Review Complete 07/04/2021  Allergen Reaction Noted   Penicillins Rash     ROS:  General: Negative for anorexia, weight loss, fever, chills, fatigue, weakness. ENT: Negative for hoarseness, difficulty swallowing , nasal congestion. CV: Negative for chest pain, angina, palpitations, dyspnea on exertion, peripheral edema.  Respiratory: Negative for dyspnea at rest, dyspnea on exertion, cough, sputum, wheezing.  GI: See history of present illness. GU:  Negative for dysuria, hematuria, urinary incontinence, urinary frequency, nocturnal urination.  Endo: Negative for unusual weight change.    Physical Examination:   BP (!) 141/82   Pulse (!) 107   Temp (!) 96.8 F (36 C) (Temporal)   Ht '5\' 4"'$  (1.626 m)   Wt 176 lb 6.4 oz (80 kg)   BMI 30.28 kg/m   General: Well-nourished, well-developed in no acute distress.  Eyes: No icterus. Mouth: masked Lungs: Clear to auscultation bilaterally.  Heart: Regular rate and rhythm, no murmurs rubs or gallops.  Abdomen: Bowel sounds are normal, nontender, nondistended, no hepatosplenomegaly or masses, no abdominal bruits or hernia , no rebound or guarding.   Extremities: No lower extremity edema. No clubbing or deformities. Neuro: Alert and oriented x 4   Skin: Warm and dry, no jaundice.   Psych: Alert and cooperative, normal mood and affect.  Labs:  Lab Results  Component Value Date   ALT 21 04/18/2021   AST 40 04/18/2021   ALKPHOS 169 (H) 04/18/2021   BILITOT 0.8 04/18/2021   Lab Results  Component Value Date   CREATININE 1.18 (H) 04/18/2021   BUN 27 (H) 04/18/2021   NA 138 04/18/2021   K 3.7 04/18/2021   CL 109 04/18/2021   CO2 20 (L) 04/18/2021   Lab Results  Component Value Date   WBC 7.9 04/18/2021   HGB 11.8 (L) 04/18/2021   HCT 35.4 (L) 04/18/2021   MCV 94.4 04/18/2021   PLT 181 04/18/2021     Imaging Studies: No results  found.   Assessment: 87 year old female with history of metastatic breast cancer, upper GI bleed December 2021, diagnosed with cirrhosis at that time, complicated by hepatic encephalopathy apathy in the setting of upper GI bleed.  Noted to have small grade 1 varices, LA grade B esophagitis, erosive gastropathy.  From a GI standpoint she has remained stable.  No evidence of recurrent bleeding or hepatic encephalopathy.  She stopped lactulose due to to frequent stools.  Currently having 2-3 stools per day.  Advised to closely monitor for any signs of hepatic encephalopathy such as lethargy, confusion, tremors.  If noted she will need to go back on lactulose, titrate to 3-4 soft stools daily.  She also stopped nadolol at some point.  Patient and spouse both deny any side effects.  Pulse today is 100.  She is due for labs. Will also follow up upcoming scans which will be adequate for hepatoma screening.    Plan: Will  follow-up CT chest/abdomen/pelvis plain in November 2022 which will be adequate for hepatoma screening. Labs, will due with Dr. Juel Burrow next month.  Resume nadolol 20 mg daily.  Call if pulse less than 60.  Report pulse rates obtained at home by pulse ox in 3-4 weeks to make sure she is at goal. Will hold off on Hep B vaccinations. Discussed with patient. She is immune to hep A. Continue pantoprazole '40mg'$  once daily.   Return to office in six months or sooner if needed.

## 2021-07-04 NOTE — Patient Instructions (Addendum)
Please resume nadolol '20mg'$  daily. New RX sent to your pharmacy. Make sure you have 2-3 soft bowel movements daily to try and prevent your ammonia level from getting high and causing lethargy and confusion. You can use lactulose to keep bowels moving adequately, smallest dose to allow 2-3 soft stools daily. Continue pantoprazole '40mg'$  once daily. Update labs as discussed. Call with pulse rate in 3-4 weeks or sooner if lower than 60 or you become weak or lightheaded. Return to the office in six months.

## 2021-07-25 DIAGNOSIS — E119 Type 2 diabetes mellitus without complications: Secondary | ICD-10-CM | POA: Diagnosis not present

## 2021-07-25 DIAGNOSIS — E782 Mixed hyperlipidemia: Secondary | ICD-10-CM | POA: Diagnosis not present

## 2021-07-25 DIAGNOSIS — D509 Iron deficiency anemia, unspecified: Secondary | ICD-10-CM | POA: Diagnosis not present

## 2021-07-31 DIAGNOSIS — K2100 Gastro-esophageal reflux disease with esophagitis, without bleeding: Secondary | ICD-10-CM | POA: Diagnosis not present

## 2021-07-31 DIAGNOSIS — D509 Iron deficiency anemia, unspecified: Secondary | ICD-10-CM | POA: Diagnosis not present

## 2021-07-31 DIAGNOSIS — N1832 Chronic kidney disease, stage 3b: Secondary | ICD-10-CM | POA: Diagnosis not present

## 2021-07-31 DIAGNOSIS — E782 Mixed hyperlipidemia: Secondary | ICD-10-CM | POA: Diagnosis not present

## 2021-07-31 DIAGNOSIS — Z0001 Encounter for general adult medical examination with abnormal findings: Secondary | ICD-10-CM | POA: Diagnosis not present

## 2021-07-31 DIAGNOSIS — C50911 Malignant neoplasm of unspecified site of right female breast: Secondary | ICD-10-CM | POA: Diagnosis not present

## 2021-07-31 DIAGNOSIS — E559 Vitamin D deficiency, unspecified: Secondary | ICD-10-CM | POA: Diagnosis not present

## 2021-07-31 DIAGNOSIS — Z23 Encounter for immunization: Secondary | ICD-10-CM | POA: Diagnosis not present

## 2021-07-31 DIAGNOSIS — K7460 Unspecified cirrhosis of liver: Secondary | ICD-10-CM | POA: Diagnosis not present

## 2021-07-31 DIAGNOSIS — E119 Type 2 diabetes mellitus without complications: Secondary | ICD-10-CM | POA: Diagnosis not present

## 2021-08-01 ENCOUNTER — Encounter (HOSPITAL_COMMUNITY): Payer: Self-pay | Admitting: Emergency Medicine

## 2021-08-01 ENCOUNTER — Observation Stay (HOSPITAL_COMMUNITY): Payer: Medicare HMO

## 2021-08-01 ENCOUNTER — Other Ambulatory Visit: Payer: Self-pay

## 2021-08-01 ENCOUNTER — Emergency Department (HOSPITAL_COMMUNITY): Payer: Medicare HMO

## 2021-08-01 ENCOUNTER — Inpatient Hospital Stay (HOSPITAL_COMMUNITY)
Admission: EM | Admit: 2021-08-01 | Discharge: 2021-08-02 | DRG: 309 | Disposition: A | Discharge status: Home / Self Care | Payer: Medicare HMO | Attending: Internal Medicine | Admitting: Internal Medicine

## 2021-08-01 DIAGNOSIS — E785 Hyperlipidemia, unspecified: Secondary | ICD-10-CM | POA: Diagnosis present

## 2021-08-01 DIAGNOSIS — Z9842 Cataract extraction status, left eye: Secondary | ICD-10-CM

## 2021-08-01 DIAGNOSIS — K922 Gastrointestinal hemorrhage, unspecified: Secondary | ICD-10-CM | POA: Diagnosis present

## 2021-08-01 DIAGNOSIS — I110 Hypertensive heart disease with heart failure: Secondary | ICD-10-CM | POA: Diagnosis present

## 2021-08-01 DIAGNOSIS — K2100 Gastro-esophageal reflux disease with esophagitis, without bleeding: Secondary | ICD-10-CM | POA: Diagnosis present

## 2021-08-01 DIAGNOSIS — H9190 Unspecified hearing loss, unspecified ear: Secondary | ICD-10-CM | POA: Diagnosis present

## 2021-08-01 DIAGNOSIS — C799 Secondary malignant neoplasm of unspecified site: Secondary | ICD-10-CM | POA: Diagnosis present

## 2021-08-01 DIAGNOSIS — D649 Anemia, unspecified: Secondary | ICD-10-CM | POA: Diagnosis not present

## 2021-08-01 DIAGNOSIS — K7460 Unspecified cirrhosis of liver: Secondary | ICD-10-CM | POA: Diagnosis present

## 2021-08-01 DIAGNOSIS — T50B95A Adverse effect of other viral vaccines, initial encounter: Secondary | ICD-10-CM | POA: Diagnosis present

## 2021-08-01 DIAGNOSIS — Z8249 Family history of ischemic heart disease and other diseases of the circulatory system: Secondary | ICD-10-CM | POA: Diagnosis not present

## 2021-08-01 DIAGNOSIS — Z825 Family history of asthma and other chronic lower respiratory diseases: Secondary | ICD-10-CM | POA: Diagnosis not present

## 2021-08-01 DIAGNOSIS — I4891 Unspecified atrial fibrillation: Secondary | ICD-10-CM | POA: Diagnosis not present

## 2021-08-01 DIAGNOSIS — Z7982 Long term (current) use of aspirin: Secondary | ICD-10-CM

## 2021-08-01 DIAGNOSIS — Z833 Family history of diabetes mellitus: Secondary | ICD-10-CM

## 2021-08-01 DIAGNOSIS — E871 Hypo-osmolality and hyponatremia: Secondary | ICD-10-CM | POA: Diagnosis not present

## 2021-08-01 DIAGNOSIS — R5083 Postvaccination fever: Secondary | ICD-10-CM | POA: Diagnosis present

## 2021-08-01 DIAGNOSIS — I4819 Other persistent atrial fibrillation: Principal | ICD-10-CM | POA: Diagnosis present

## 2021-08-01 DIAGNOSIS — I959 Hypotension, unspecified: Secondary | ICD-10-CM | POA: Diagnosis present

## 2021-08-01 DIAGNOSIS — Z9841 Cataract extraction status, right eye: Secondary | ICD-10-CM | POA: Diagnosis not present

## 2021-08-01 DIAGNOSIS — I480 Paroxysmal atrial fibrillation: Secondary | ICD-10-CM | POA: Diagnosis present

## 2021-08-01 DIAGNOSIS — I700 Atherosclerosis of aorta: Secondary | ICD-10-CM | POA: Diagnosis not present

## 2021-08-01 DIAGNOSIS — R000 Tachycardia, unspecified: Secondary | ICD-10-CM | POA: Diagnosis not present

## 2021-08-01 DIAGNOSIS — I495 Sick sinus syndrome: Secondary | ICD-10-CM | POA: Diagnosis present

## 2021-08-01 DIAGNOSIS — I350 Nonrheumatic aortic (valve) stenosis: Secondary | ICD-10-CM

## 2021-08-01 DIAGNOSIS — Z806 Family history of leukemia: Secondary | ICD-10-CM

## 2021-08-01 DIAGNOSIS — Z880 Allergy status to penicillin: Secondary | ICD-10-CM

## 2021-08-01 DIAGNOSIS — N179 Acute kidney failure, unspecified: Secondary | ICD-10-CM | POA: Diagnosis present

## 2021-08-01 DIAGNOSIS — Y929 Unspecified place or not applicable: Secondary | ICD-10-CM | POA: Diagnosis not present

## 2021-08-01 DIAGNOSIS — Z96651 Presence of right artificial knee joint: Secondary | ICD-10-CM | POA: Diagnosis present

## 2021-08-01 DIAGNOSIS — Z853 Personal history of malignant neoplasm of breast: Secondary | ICD-10-CM

## 2021-08-01 DIAGNOSIS — Z20822 Contact with and (suspected) exposure to covid-19: Secondary | ICD-10-CM | POA: Diagnosis present

## 2021-08-01 DIAGNOSIS — Z961 Presence of intraocular lens: Secondary | ICD-10-CM | POA: Diagnosis present

## 2021-08-01 DIAGNOSIS — B349 Viral infection, unspecified: Secondary | ICD-10-CM | POA: Diagnosis present

## 2021-08-01 DIAGNOSIS — R509 Fever, unspecified: Secondary | ICD-10-CM | POA: Diagnosis not present

## 2021-08-01 DIAGNOSIS — I083 Combined rheumatic disorders of mitral, aortic and tricuspid valves: Secondary | ICD-10-CM | POA: Diagnosis present

## 2021-08-01 DIAGNOSIS — Z515 Encounter for palliative care: Secondary | ICD-10-CM

## 2021-08-01 DIAGNOSIS — Z79899 Other long term (current) drug therapy: Secondary | ICD-10-CM

## 2021-08-01 HISTORY — DX: Nonrheumatic aortic (valve) stenosis: I35.0

## 2021-08-01 LAB — URINALYSIS, ROUTINE W REFLEX MICROSCOPIC
Bilirubin Urine: NEGATIVE
Glucose, UA: NEGATIVE mg/dL
Hgb urine dipstick: NEGATIVE
Ketones, ur: NEGATIVE mg/dL
Leukocytes,Ua: NEGATIVE
Nitrite: NEGATIVE
Protein, ur: >=300 mg/dL — AB
Specific Gravity, Urine: 1.013 (ref 1.005–1.030)
pH: 6 (ref 5.0–8.0)

## 2021-08-01 LAB — COMPREHENSIVE METABOLIC PANEL
ALT: 22 U/L (ref 0–44)
AST: 49 U/L — ABNORMAL HIGH (ref 15–41)
Albumin: 3.1 g/dL — ABNORMAL LOW (ref 3.5–5.0)
Alkaline Phosphatase: 176 U/L — ABNORMAL HIGH (ref 38–126)
Anion gap: 8 (ref 5–15)
BUN: 23 mg/dL (ref 8–23)
CO2: 20 mmol/L — ABNORMAL LOW (ref 22–32)
Calcium: 8.9 mg/dL (ref 8.9–10.3)
Chloride: 106 mmol/L (ref 98–111)
Creatinine, Ser: 1.21 mg/dL — ABNORMAL HIGH (ref 0.44–1.00)
GFR, Estimated: 43 mL/min — ABNORMAL LOW (ref >60)
Glucose, Bld: 134 mg/dL — ABNORMAL HIGH (ref 70–99)
Potassium: 3.9 mmol/L (ref 3.5–5.1)
Sodium: 134 mmol/L — ABNORMAL LOW (ref 135–145)
Total Bilirubin: 1.4 mg/dL — ABNORMAL HIGH (ref 0.3–1.2)
Total Protein: 6.9 g/dL (ref 6.5–8.1)

## 2021-08-01 LAB — CBC WITH DIFFERENTIAL/PLATELET
Abs Immature Granulocytes: 0.02 10*3/uL (ref 0.00–0.07)
Basophils Absolute: 0.1 10*3/uL (ref 0.0–0.1)
Basophils Relative: 1 %
Eosinophils Absolute: 0.2 10*3/uL (ref 0.0–0.5)
Eosinophils Relative: 3 %
HCT: 35 % — ABNORMAL LOW (ref 36.0–46.0)
Hemoglobin: 12 g/dL (ref 12.0–15.0)
Immature Granulocytes: 0 %
Lymphocytes Relative: 7 %
Lymphs Abs: 0.5 10*3/uL — ABNORMAL LOW (ref 0.7–4.0)
MCH: 31.8 pg (ref 26.0–34.0)
MCHC: 34.3 g/dL (ref 30.0–36.0)
MCV: 92.8 fL (ref 80.0–100.0)
Monocytes Absolute: 0.8 10*3/uL (ref 0.1–1.0)
Monocytes Relative: 10 %
Neutro Abs: 6.2 10*3/uL (ref 1.7–7.7)
Neutrophils Relative %: 79 %
Platelets: 185 10*3/uL (ref 150–400)
RBC: 3.77 MIL/uL — ABNORMAL LOW (ref 3.87–5.11)
RDW: 15.5 % (ref 11.5–15.5)
WBC: 7.8 10*3/uL (ref 4.0–10.5)
nRBC: 0 % (ref 0.0–0.2)

## 2021-08-01 LAB — ECHOCARDIOGRAM COMPLETE
AR max vel: 0.61 cm2
AV Area VTI: 0.69 cm2
AV Area mean vel: 0.62 cm2
AV Mean grad: 35.7 mmHg
AV Peak grad: 63.6 mmHg
Ao pk vel: 3.99 m/s
Area-P 1/2: 6.07 cm2
Height: 64 in
MV VTI: 3.04 cm2
S' Lateral: 2.7 cm
Weight: 2821.89 oz

## 2021-08-01 LAB — HEMOGLOBIN A1C
Hgb A1c MFr Bld: 5.7 % — ABNORMAL HIGH (ref 4.8–5.6)
Mean Plasma Glucose: 116.89 mg/dL

## 2021-08-01 LAB — CBG MONITORING, ED
Glucose-Capillary: 98 mg/dL (ref 70–99)
Glucose-Capillary: 76 mg/dL (ref 70–99)

## 2021-08-01 LAB — GLUCOSE, CAPILLARY
Glucose-Capillary: 136 mg/dL — ABNORMAL HIGH (ref 70–99)
Glucose-Capillary: 103 mg/dL — ABNORMAL HIGH (ref 70–99)

## 2021-08-01 LAB — TSH: TSH: 1.633 u[IU]/mL (ref 0.350–4.500)

## 2021-08-01 LAB — LACTIC ACID, PLASMA
Lactic Acid, Venous: 2.1 mmol/L (ref 0.5–1.9)
Lactic Acid, Venous: 1.2 mmol/L (ref 0.5–1.9)

## 2021-08-01 LAB — RESP PANEL BY RT-PCR (FLU A&B, COVID) ARPGX2
Influenza A by PCR: NEGATIVE
Influenza B by PCR: NEGATIVE
SARS Coronavirus 2 by RT PCR: NEGATIVE

## 2021-08-01 LAB — MAGNESIUM: Magnesium: 1.8 mg/dL (ref 1.7–2.4)

## 2021-08-01 LAB — AMMONIA: Ammonia: 67 umol/L — ABNORMAL HIGH (ref 9–35)

## 2021-08-01 MED ORDER — HEPARIN SODIUM (PORCINE) 5000 UNIT/ML IJ SOLN
5000.0000 [IU] | Freq: Three times a day (TID) | INTRAMUSCULAR | Status: DC
  Administered 2021-08-01 – 2021-08-02 (×3): 5000 [IU] via SUBCUTANEOUS
  Filled 2021-08-01 (×3): qty 1

## 2021-08-01 MED ORDER — DILTIAZEM HCL-DEXTROSE 125-5 MG/125ML-% IV SOLN (PREMIX)
5.0000 mg/h | INTRAVENOUS | Status: DC
  Administered 2021-08-01: 5 mg/h via INTRAVENOUS
  Filled 2021-08-01: qty 125

## 2021-08-01 MED ORDER — SODIUM CHLORIDE 0.9 % IV BOLUS
500.0000 mL | Freq: Once | INTRAVENOUS | Status: AC
  Administered 2021-08-01: 500 mL via INTRAVENOUS

## 2021-08-01 MED ORDER — MAGNESIUM SULFATE 2 GM/50ML IV SOLN
2.0000 g | Freq: Once | INTRAVENOUS | Status: AC
  Administered 2021-08-01: 2 g via INTRAVENOUS
  Filled 2021-08-01: qty 50

## 2021-08-01 MED ORDER — MORPHINE SULFATE (PF) 2 MG/ML IV SOLN: 2.0000 mg | INTRAVENOUS | Status: DC | PRN

## 2021-08-01 MED ORDER — PANTOPRAZOLE SODIUM 40 MG PO TBEC
40.0000 mg | DELAYED_RELEASE_TABLET | Freq: Every day | ORAL | Status: DC
  Administered 2021-08-01 – 2021-08-02 (×2): 40 mg via ORAL
  Filled 2021-08-01 (×2): qty 1

## 2021-08-01 MED ORDER — SODIUM CHLORIDE 0.9 % IV SOLN: INTRAVENOUS | Status: DC

## 2021-08-01 MED ORDER — ASPIRIN EC 81 MG PO TBEC
81.0000 mg | DELAYED_RELEASE_TABLET | Freq: Every day | ORAL | Status: DC
  Administered 2021-08-01 – 2021-08-02 (×2): 81 mg via ORAL
  Filled 2021-08-01 (×2): qty 1

## 2021-08-01 MED ORDER — ONDANSETRON HCL 4 MG PO TABS: 4.0000 mg | ORAL_TABLET | Freq: Four times a day (QID) | ORAL | Status: DC | PRN

## 2021-08-01 MED ORDER — LACTULOSE 10 GM/15ML PO SOLN: 20.0000 g | Freq: Every day | ORAL | Status: DC | PRN

## 2021-08-01 MED ORDER — METOPROLOL TARTRATE 25 MG PO TABS
12.5000 mg | ORAL_TABLET | Freq: Two times a day (BID) | ORAL | Status: DC
  Administered 2021-08-01 – 2021-08-02 (×2): 12.5 mg via ORAL
  Filled 2021-08-01 (×2): qty 1

## 2021-08-01 MED ORDER — ONDANSETRON HCL 4 MG/2ML IJ SOLN: 4.0000 mg | Freq: Four times a day (QID) | INTRAMUSCULAR | Status: DC | PRN

## 2021-08-01 MED ORDER — ACETAMINOPHEN 325 MG PO TABS: 650.0000 mg | ORAL_TABLET | Freq: Four times a day (QID) | ORAL | Status: DC | PRN

## 2021-08-01 MED ORDER — ACETAMINOPHEN 650 MG RE SUPP: 650.0000 mg | Freq: Four times a day (QID) | RECTAL | Status: DC | PRN

## 2021-08-01 MED ORDER — INSULIN ASPART 100 UNIT/ML IJ SOLN: 0.0000 [IU] | Freq: Three times a day (TID) | INTRAMUSCULAR | Status: DC

## 2021-08-01 MED ORDER — ACETAMINOPHEN 325 MG PO TABS
650.0000 mg | ORAL_TABLET | Freq: Once | ORAL | Status: AC | PRN
  Administered 2021-08-01: 650 mg via ORAL
  Filled 2021-08-01: qty 2

## 2021-08-01 NOTE — ED Notes (Signed)
Pt having frequent periods of pauses, Cardizem was stopped when noted, notified EDP, he is aware of the same.

## 2021-08-01 NOTE — ED Triage Notes (Signed)
Per husband pt has been running fever and high heart rate since getting flu shot Tuesday.

## 2021-08-01 NOTE — Progress Notes (Signed)
Palliative: Thank you for this consult. Unfortunately due to high volume of consults there will be a delay in a Palliative Provider seeing this patient. Palliative Medicine is anticipated to return to service on 10/6 and will see patient at that time.  No charge Quinn Axe, NP Palliative Medicine Please call Palliative Medicine team phone with any questions [phone]. For individual providers please see [facility].

## 2021-08-01 NOTE — Progress Notes (Signed)
*  PRELIMINARY RESULTS* Echocardiogram 2D Echocardiogram has been performed.  [name] 08/01/2021, 11:19 AM

## 2021-08-01 NOTE — ED Provider Notes (Signed)
[facility] Provider Note   CSN: [id number] Arrival date & time: 08/01/21  0054     History Chief Complaint  Patient presents with   Fever    [name] is a 87 y.o. female.  Patient is an 87 year old female with past medical history of hypertension, hyperlipidemia, GERD.  Patient presenting for evaluation of elevated heart rate and fever.  According to the husband, patient had a flu shot this morning given by her primary doctor.  This evening she began to feel poorly with high heart rate.  Patient denies any specific symptoms such as urinary complaints, diarrhea, cough, or other explanation for the fever.  She denies ill contacts.  The history is provided by the patient.      Past Medical History:  Diagnosis Date   Cancer ([place]) 06/2018   right breast cancer   Cirrhosis ([place])    GERD (gastroesophageal reflux disease)    occasional    Hyperlipidemia    Hypertension    clearance with note Dr Nevada Crane on chart   Pneumonia    2 years ago/ states occ cough with sinus drainage- no fever   Thyroid nodule    with biopsy- states following medically    Patient Active Problem List   Diagnosis Date Noted   Hepatic cirrhosis ([place])    Encephalopathy, hepatic 10/08/2020   Aortic valvar stenosis 10/08/2020   GI bleed 10/07/2020   AKI (acute kidney injury) ([place]) 10/07/2020   Near syncope 10/07/2020   Moderate protein-calorie malnutrition ([place]) 10/07/2020   Leukocytosis 10/07/2020   Goals of care, counseling/discussion 02/15/2019   Malignant neoplasm of upper-outer quadrant of right breast in female, estrogen receptor negative ([place]) 07/15/2018   Peripheral polyneuropathy 12/12/2017   Paresthesia 11/24/2017   Symptomatic anemia 12/19/2011   Postop Hyponatremia 12/17/2011   OA (osteoarthritis) of knee 12/16/2011   KNEE, ARTHRITIS, DEGEN./OSTEO 10/10/2010   TEAR MEDIAL MENISCUS 10/10/2010   TEAR LATERAL MENISCUS 10/10/2010    Past Surgical History:   Procedure Laterality Date   CATARACT EXTRACTION W/PHACO  11/30/2012   Procedure: CATARACT EXTRACTION PHACO AND INTRAOCULAR LENS PLACEMENT ([place]);  Surgeon: Williams Che, MD;  Location: [facility];  Service: Ophthalmology;  Laterality: Left;  CDE:11.49   CATARACT EXTRACTION W/PHACO Right 03/15/2013   Procedure: CATARACT EXTRACTION PHACO AND INTRAOCULAR LENS PLACEMENT (I[facility]);  Surgeon: Williams Che, MD;  Location: [facility];  Service: Ophthalmology;  Laterality: Right;  CDE 16.15   COLONOSCOPY     ESOPHAGOGASTRODUODENOSCOPY (EGD) WITH PROPOFOL N/A 10/07/2020   Dr. 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  Distal esophageal erosion/excoriations overlying varices.  More consistent with trauma/reflux than variceal bleeding stigmata.  Stomach incompletely seen due to presence of old blood.  Moderate sized hiatal hernia.  4 mm antral erosion, innocent appearing.  Repeat EGD in 2 days.   ESOPHAGOGASTRODUODENOSCOPY (EGD) WITH PROPOFOL N/A 10/11/2020   Dr. Laural Golden: Grade 1 varices in the distal esophagus.  LA grade B esophagitis, 1 benign-appearing intrinsic mild stenosis at 34 cm from the incisor, 4 cm hiatal hernia, single erosion in the gastric antrum and prepyloric region.   EXCISION OF SKIN TAG Right 06/17/2016   Procedure: SHAVE OF SKIN TAG RIGHT BUTTOCK;  Surgeon: Aviva Signs, MD;  Location: [facility];  Service: General;  Laterality: Right;   JOINT REPLACEMENT     left knee   MASS EXCISION Left 06/17/2016   Procedure: EXCISION SKIN MALIGNANT LESION LEFT BUTTOCK;  Surgeon: Aviva Signs,  MD;  Location: [facility];  Service: General;  Laterality: Left;   PORTACATH PLACEMENT Right 07/28/2018   Procedure: INSERTION PORT-A-CATH WITH ULTRASOUND;  Surgeon: Rolm Bookbinder, MD;  Location: [facility];  Service: General;  Laterality: Right;   PUNCH BIOPSY OF SKIN Right 07/28/2018   Procedure: PUNCH BIOPSY OF SKIN RIGHT BREAST;  Surgeon: Rolm Bookbinder, MD;   Location: [facility];  Service: General;  Laterality: Right;   TOTAL KNEE ARTHROPLASTY  12/16/2011   Procedure: TOTAL KNEE ARTHROPLASTY;  Surgeon: Gearlean Alf, MD;  Location: [facility];  Service: Orthopedics;  Laterality: Right;   TUBAL LIGATION       OB History   No obstetric history on file.     Family History  Problem Relation Age of Onset   Heart attack Mother    Bronchitis Father    Diabetes Sister    Leukemia Brother    Lung disease Brother    Lung disease Sister     Social History   Tobacco Use   Smoking status: Never   Smokeless tobacco: Never  Vaping Use   Vaping Use: Never used  Substance Use Topics   Alcohol use: No   Drug use: No    Home Medications Prior to Admission medications   Medication Sig Start Date End Date Taking? Authorizing Provider  aspirin EC 81 MG tablet Take 1 tablet (81 mg total) by mouth daily. Swallow whole. 10/16/20 10/16/21  Nita Sells, MD  hydrocortisone 1 % ointment Apply 1 application topically 2 (two) times daily. Patient taking differently: Apply 1 application topically as needed. 09/18/18   Lockamy, Randi L, NP-C  lactulose (CHRONULAC) 10 GM/15ML solution Take 30 mLs (20 g total) by mouth 2 (two) times daily. Patient taking differently: Take 20 g by mouth as needed. 10/12/20   Nita Sells, MD  Misc Natural Products (ESSIAC TONIC PO) Take by mouth daily.    [provider]  Multiple Vitamin (MULTIVITAMIN WITH MINERALS) TABS tablet Take 1 tablet by mouth daily.    [provider]  nadolol (CORGARD) 20 MG tablet Take 1 tablet (20 mg total) by mouth daily. 07/04/21   Mahala Menghini, PA-C  ONETOUCH ULTRA test strip  02/06/21   [provider]  OVER THE COUNTER MEDICATION Fanbendosol once daily    [provider]  pantoprazole (PROTONIX) 40 MG tablet Take 1 tablet (40 mg total) by mouth 2 (two) times daily before a meal. Patient taking differently: Take 40 mg by mouth  daily. 10/12/20   Nita Sells, MD    Allergies    Penicillins  Review of Systems   Review of Systems  All other systems reviewed and are negative.  Physical Exam Updated Vital Signs BP (!) 130/107   Pulse (!) 126   Temp (!) 102 F (38.9 C) (Rectal)   Resp 20   Ht 5\' 4"  (1.626 m)   Wt 80 kg   SpO2 96%   BMI 30.27 kg/m   Physical Exam Vitals and nursing note reviewed.  Constitutional:      General: She is not in acute distress.    Appearance: She is well-developed. She is not diaphoretic.  HENT:     Head: Normocephalic and atraumatic.  Cardiovascular:     Rate and Rhythm: Tachycardia present. Rhythm irregular.     Heart sounds: No murmur heard.   No friction rub. No gallop.  Pulmonary:     Effort: Pulmonary effort is normal. No respiratory distress.  Breath sounds: Normal breath sounds. No wheezing.  Abdominal:     General: Bowel sounds are normal. There is no distension.     Palpations: Abdomen is soft.     Tenderness: There is no abdominal tenderness.  Musculoskeletal:        General: Normal range of motion.     Cervical back: Normal range of motion and neck supple.  Skin:    General: Skin is warm and dry.  Neurological:     General: No focal deficit present.     Mental Status: She is alert and oriented to person, place, and time.    ED Results / Procedures / Treatments   Labs (all labs ordered are listed, but only abnormal results are displayed) Labs Reviewed  CULTURE, BLOOD (ROUTINE X 2)  CULTURE, BLOOD (ROUTINE X 2)  LACTIC ACID, PLASMA  LACTIC ACID, PLASMA  COMPREHENSIVE METABOLIC PANEL  CBC WITH DIFFERENTIAL/PLATELET  URINALYSIS, ROUTINE W REFLEX MICROSCOPIC    EKG EKG Interpretation  Date/Time:  Wednesday August 01 2021 01:12:13 EDT Ventricular Rate:  126 PR Interval:    QRS Duration: 80 QT Interval:  359 QTC Calculation: 503 R Axis:   -9 Text Interpretation: Atrial fibrillation Probable anteroseptal infarct, old Prolonged  QT interval When compared with prior ecgs, atrial fibrillation has replaced sinus rhythm Confirmed by Veryl Speak [id number]) on 08/01/2021 1:23:26 AM  Radiology No results found.  Procedures Procedures   Medications Ordered in ED Medications  acetaminophen (TYLENOL) tablet 650 mg (has no administration in time range)  sodium chloride 0.9 % bolus 500 mL (has no administration in time range)    ED Course  I have reviewed the triage vital signs and the nursing notes.  Pertinent labs & imaging results that were available during my care of the patient were reviewed by me and considered in my medical decision making (see chart for details).    MDM Rules/Calculators/A&P  Patient presenting with fever and rapid heartbeat.  She arrives here with temperature of 102 and initial EKG reveals atrial fibrillation with RVR.  This is a new finding for her.  I am uncertain as to the source of the fever, whether this is related to her flu shot she received this morning or something different.  Blood cultures were obtained and are pending.  Chest x-ray is clear.  Urine sample pending as well.  She has had episodes of atrial fibrillation followed by bouts of irregular sinus rhythm, but continues to be mainly in A. fib.  A Cardizem drip will be initiated and patient admitted to the hospitalist service.  CRITICAL CARE Performed by: Veryl Speak Total critical care time: 40 minutes Critical care time was exclusive of separately billable procedures and treating other patients. Critical care was necessary to treat or prevent imminent or life-threatening deterioration. Critical care was time spent personally by me on the following activities: development of treatment plan with patient and/or surrogate as well as nursing, discussions with consultants, evaluation of patient's response to treatment, examination of patient, obtaining history from patient or surrogate, ordering and performing treatments and  interventions, ordering and review of laboratory studies, ordering and review of radiographic studies, pulse oximetry and re-evaluation of patient's condition.   Final Clinical Impression(s) / ED Diagnoses Final diagnoses:  None    Rx / DC Orders ED Discharge Orders     None        Veryl Speak, MD 08/01/21 [phone]

## 2021-08-01 NOTE — H&P (Signed)
TRH H&P    Patient Demographics:    [name], is a 87 y.o. female  MRN: [id number]  DOB - Sep 04, 1930  Admit Date - 08/01/2021  Referring MD/NP/PA: Stark Jock  Outpatient Primary MD for the patient is [name], MD  Patient coming from: Home  Chief complaint- Fever   HPI:    [name]  is a 87 y.o. female, with history of breast cancer, cirrhosis, GERD, hyperlipidemia, hypertension, thyroid nodule, MR presents ED with a chief complaint of fever.  Patient is very hard of hearing and husband at bedside helps to provide history.  They report that patient went for her routine wellness exam with her PCP, where she received her flu shot.  She started running a fever later measured at 101.5 according to husband.  He did not take any Tylenol or ibuprofen.  He also noticed on the blood pressure machine that her heart rate was up to 130.  They called the on-call nurse for at night who advised him to come into the ED.  Patient reports a day.  She had no chest pain, no nausea, no vomiting, no diaphoresis.  She did have chills.  Patient has no history of arrhythmia.  She does have a history of a heart murmur.  She reports that up until this she had been in her normal state of health, and at her wellness exam everything was fine.  Patient lives at home with husband, and ambulates with a walker.  Patient does not smoke, does not drink alcohol, does not use illicit drugs.  She had 2 vaccines for COVID.  She is full code.  In the ED Temp 102, after Tylenol 99.5, heart rate 94-1 26, respiratory rate 16-21, blood pressure 105/67 EKG shows heart rate 126 A. fib QTC 503 Respiratory panel negative, blood cultures pending, UA pending Chest x-ray shows no active disease No leukocytosis with a white blood cell count of 7.8, hemoglobin 12.0 Chemistry panel reveals a creatinine at baseline of 1.21 1 L normal saline given in the ED, ,  Given, Patient was started on Cardizem drip.  She had 2 pauses, and then a long pause that resulted in the Cardizem drip being turned off.  Heart rate at the time of my exam 101, defer to cardiology for management of A. fib in a patient who is very sensitive to rate controlling medications  Patient had been on a beta-blocker as prescribed by GI for tachycardia in the past.  She reports she is never had an arrhythmia, but its not clear if she has been having paroxysmal atrial fibrillation or not.   Review of systems:    In addition to the HPI above,  Admits to fever and chills No Headache, No changes with Vision or hearing, No problems swallowing food or Liquids, No Chest pain, Cough or Shortness of Breath, No Abdominal pain, No Nausea or Vomiting, bowel movements are regular, No Blood in stool or Urine, No dysuria, No new skin rashes or bruises, No new joints pains-aches,  No new weakness, tingling, numbness  in any extremity, No recent weight gain or loss, No polyuria, polydypsia or polyphagia, No significant Mental Stressors.  All other systems reviewed and are negative.    Past History of the following :    Past Medical History:  Diagnosis Date   Cancer ([place]) 06/2018   right breast cancer   Cirrhosis ([place])    GERD (gastroesophageal reflux disease)    occasional    Hyperlipidemia    Hypertension    clearance with note Dr Nevada Crane on chart   Pneumonia    2 years ago/ states occ cough with sinus drainage- no fever   Thyroid nodule    with biopsy- states following medically      Past Surgical History:  Procedure Laterality Date   CATARACT EXTRACTION [facility]  11/30/2012   Procedure: CATARACT EXTRACTION PHACO AND INTRAOCULAR LENS PLACEMENT ([place]);  Surgeon: Williams Che, MD;  Location: [facility];  Service: Ophthalmology;  Laterality: Left;  CDE:11.49   CATARACT EXTRACTION W/PHACO Right 03/15/2013   Procedure: CATARACT EXTRACTION PHACO AND INTRAOCULAR LENS PLACEMENT ([facility]);   Surgeon: Williams Che, MD;  Location: [facility];  Service: Ophthalmology;  Laterality: Right;  CDE 16.15   COLONOSCOPY     ESOPHAGOGASTRODUODENOSCOPY (EGD) WITH PROPOFOL N/A 10/07/2020   Dr. 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  Distal esophageal erosion/excoriations overlying varices.  More consistent with trauma/reflux than variceal bleeding stigmata.  Stomach incompletely seen due to presence of old blood.  Moderate sized hiatal hernia.  4 mm antral erosion, innocent appearing.  Repeat EGD in 2 days.   ESOPHAGOGASTRODUODENOSCOPY (EGD) WITH PROPOFOL N/A 10/11/2020   Dr. Laural Golden: Grade 1 varices in the distal esophagus.  LA grade B esophagitis, 1 benign-appearing intrinsic mild stenosis at 34 cm from the incisor, 4 cm hiatal hernia, single erosion in the gastric antrum and prepyloric region.   EXCISION OF SKIN TAG Right 06/17/2016   Procedure: SHAVE OF SKIN TAG RIGHT BUTTOCK;  Surgeon: Aviva Signs, MD;  Location: [facility];  Service: General;  Laterality: Right;   JOINT REPLACEMENT     left knee   MASS EXCISION Left 06/17/2016   Procedure: EXCISION SKIN MALIGNANT LESION LEFT BUTTOCK;  Surgeon: Aviva Signs, MD;  Location: [facility];  Service: General;  Laterality: Left;   PORTACATH PLACEMENT Right 07/28/2018   Procedure: INSERTION PORT-A-CATH WITH ULTRASOUND;  Surgeon: Rolm Bookbinder, MD;  Location: [place];  Service: General;  Laterality: Right;   PUNCH BIOPSY OF SKIN Right 07/28/2018   Procedure: PUNCH BIOPSY OF SKIN RIGHT BREAST;  Surgeon: Rolm Bookbinder, MD;  Location: [place];  Service: General;  Laterality: Right;   TOTAL KNEE ARTHROPLASTY  12/16/2011   Procedure: TOTAL KNEE ARTHROPLASTY;  Surgeon: Gearlean Alf, MD;  Location: [facility];  Service: Orthopedics;  Laterality: Right;   TUBAL LIGATION        Social History:      Social History   Tobacco Use   Smoking status: Never   Smokeless tobacco:  Never  Substance Use Topics   Alcohol use: No       Family History :     Family History  Problem Relation Age of Onset   Heart attack Mother    Bronchitis Father    Diabetes Sister    Leukemia Brother    Lung disease Brother    Lung disease Sister       Home Medications:   Prior to Admission medications   Medication  Sig Start Date End Date Taking? Authorizing Provider  aspirin EC 81 MG tablet Take 1 tablet (81 mg total) by mouth daily. Swallow whole. 10/16/20 10/16/21  Nita Sells, MD  hydrocortisone 1 % ointment Apply 1 application topically 2 (two) times daily. Patient taking differently: Apply 1 application topically as needed. 09/18/18   Lockamy, Randi L, NP-C  lactulose (CHRONULAC) 10 GM/15ML solution Take 30 mLs (20 g total) by mouth 2 (two) times daily. Patient taking differently: Take 20 g by mouth as needed. 10/12/20   Nita Sells, MD  Misc Natural Products (ESSIAC TONIC PO) Take by mouth daily.    [provider]  Multiple Vitamin (MULTIVITAMIN WITH MINERALS) TABS tablet Take 1 tablet by mouth daily.    [provider]  nadolol (CORGARD) 20 MG tablet Take 1 tablet (20 mg total) by mouth daily. 07/04/21   Mahala Menghini, PA-C  ONETOUCH ULTRA test strip  02/06/21   [provider]  OVER THE COUNTER MEDICATION Fanbendosol once daily    [provider]  pantoprazole (PROTONIX) 40 MG tablet Take 1 tablet (40 mg total) by mouth 2 (two) times daily before a meal. Patient taking differently: Take 40 mg by mouth daily. 10/12/20   Nita Sells, MD     Allergies:     Allergies  Allergen Reactions   Penicillins Rash    Has patient had a PCN reaction causing immediate rash, facial/tongue/throat swelling, SOB or lightheadedness with hypotension: No Has patient had a PCN reaction causing severe rash involving mucus membranes or skin necrosis: No Has patient had a PCN reaction that required hospitalization: No Has  patient had a PCN reaction occurring within the last 10 years: No If all of the above answers are "NO", then may proceed with Cephalosporin use.      Physical Exam:   Vitals  Blood pressure 117/66, pulse (!) 107, temperature 99.5 F (37.5 C), temperature source Oral, resp. rate 18, height 5\' 4"  (1.626 m), weight 80 kg, SpO2 91 %.  1.  General: Patient lying supine in bed,  no acute distress   2. Psychiatric: Alert and oriented,  mood and behavior normal for situation, pleasant and cooperative with exam   3. Neurologic: Speech and language are normal, face is symmetric, moves all 4 extremities voluntarily, at baseline without acute deficits on limited exam   4. HEENMT:  Head is atraumatic, normocephalic, pupils reactive to light, neck is supple, trachea is midline, mucous membranes are moist   5. Respiratory : Lungs are clear to auscultation bilaterally without wheezing, rhonchi, rales, no cyanosis, no increase in work of breathing or accessory muscle use   6. Cardiovascular : Heart rate normal, rhythm is regular, murmur present, rubs or gallops, no peripheral edema, peripheral pulses palpated   7. Gastrointestinal:  Abdomen is soft, nondistended, nontender to palpation bowel sounds active, no masses or organomegaly palpated   8. Skin:  Skin is warm, dry and intact without rashes, acute lesions, or ulcers on limited exam   9.Musculoskeletal:  No acute deformities or trauma, no asymmetry in tone, no peripheral edema, peripheral pulses palpated, no tenderness to palpation in the extremities     Data Review:    CBC Recent Labs  Lab 08/01/21 0120  WBC 7.8  HGB 12.0  HCT 35.0*  PLT 185  MCV 92.8  MCH 31.8  MCHC 34.3  RDW 15.5  LYMPHSABS 0.5*  MONOABS 0.8  EOSABS 0.2  BASOSABS 0.1   ------------------------------------------------------------------------------------------------------------------  Results for orders placed  or performed during the hospital  encounter of 08/01/21 (from the past 48 hour(s))  Lactic acid, plasma     Status: Abnormal   Collection Time: 08/01/21  1:20 AM  Result Value Ref Range   Lactic Acid, Venous 2.1 (HH) 0.5 - 1.9 mmol/L    Comment: CRITICAL RESULT CALLED TO, READ BACK BY AND VERIFIED WITH: Gibson,K@0252  by [email or web address],b 10.5.22 Performed at [facility], [address] [place], [place] [address]   Comprehensive metabolic panel     Status: Abnormal   Collection Time: 08/01/21  1:20 AM  Result Value Ref Range   Sodium 134 (L) 135 - 145 mmol/L   Potassium 3.9 3.5 - 5.1 mmol/L   Chloride 106 98 - 111 mmol/L   CO2 20 (L) 22 - 32 mmol/L   Glucose, Bld 134 (H) 70 - 99 mg/dL    Comment: Glucose reference range applies only to samples taken after fasting for at least 8 hours.   BUN 23 8 - 23 mg/dL   Creatinine, Ser 1.21 (H) 0.44 - 1.00 mg/dL   Calcium 8.9 8.9 - 10.3 mg/dL   Total Protein 6.9 6.5 - 8.1 g/dL   Albumin 3.1 (L) 3.5 - 5.0 g/dL   AST 49 (H) 15 - 41 U/L   ALT 22 0 - 44 U/L   Alkaline Phosphatase 176 (H) 38 - 126 U/L   Total Bilirubin 1.4 (H) 0.3 - 1.2 mg/dL   GFR, Estimated 43 (L) >60 mL/min    Comment: (NOTE) Calculated using the CKD-EPI Creatinine Equation (2021)    Anion gap 8 5 - 15    Comment: Performed at [facility], [address] [place], [place] [address]  CBC with Differential     Status: Abnormal   Collection Time: 08/01/21  1:20 AM  Result Value Ref Range   WBC 7.8 4.0 - 10.5 K/uL   RBC 3.77 (L) 3.87 - 5.11 MIL/uL   Hemoglobin 12.0 12.0 - 15.0 g/dL   HCT 35.0 (L) 36.0 - 46.0 %   MCV 92.8 80.0 - 100.0 fL   MCH 31.8 26.0 - 34.0 pg   MCHC 34.3 30.0 - 36.0 g/dL   RDW 15.5 11.5 - 15.5 %   Platelets 185 150 - 400 K/uL   nRBC 0.0 0.0 - 0.2 %   Neutrophils Relative % 79 %   Neutro Abs 6.2 1.7 - 7.7 K/uL   Lymphocytes Relative 7 %   Lymphs Abs 0.5 (L) 0.7 - 4.0 K/uL   Monocytes Relative 10 %   Monocytes Absolute 0.8 0.1 - 1.0 K/uL   Eosinophils Relative 3 %   Eosinophils Absolute  0.2 0.0 - 0.5 K/uL   Basophils Relative 1 %   Basophils Absolute 0.1 0.0 - 0.1 K/uL   Immature Granulocytes 0 %   Abs Immature Granulocytes 0.02 0.00 - 0.07 K/uL    Comment: Performed at [facility], [address], [place], [place] [address]  Culture, blood (routine x 2)     Status: None (Preliminary result)   Collection Time: 08/01/21  1:20 AM   Specimen: BLOOD  Result Value Ref Range   Specimen Description BLOOD BLOOD RIGHT WRIST    Special Requests      Blood Culture adequate volume BOTTLES DRAWN AEROBIC AND ANAEROBIC Performed at [facility], [address], [place], [place] [address]    Culture PENDING    Report Status PENDING   Culture, blood (routine x 2)     Status: None (Preliminary result)  Collection Time: 08/01/21  1:20 AM   Specimen: BLOOD RIGHT ARM  Result Value Ref Range   Specimen Description BLOOD RIGHT ARM    Special Requests      BOTTLES DRAWN AEROBIC AND ANAEROBIC Blood Culture adequate volume Performed at [facility], [address] [place], [place] [address]    Culture PENDING    Report Status PENDING   Resp Panel by RT-PCR (Flu A&B, Covid) Nasopharyngeal Swab     Status: None   Collection Time: 08/01/21  2:03 AM   Specimen: Nasopharyngeal Swab; Nasopharyngeal(NP) swabs in vial transport medium  Result Value Ref Range   SARS Coronavirus 2 by RT PCR NEGATIVE NEGATIVE    Comment: (NOTE) SARS-CoV-2 target nucleic acids are NOT DETECTED.  The SARS-CoV-2 RNA is generally detectable in upper respiratory specimens during the acute phase of infection. The lowest concentration of SARS-CoV-2 viral copies this assay can detect is 138 copies/mL. A negative result does not preclude SARS-Cov-2 infection and should not be used as the sole basis for treatment or other patient management decisions. A negative result may occur with  improper specimen collection/handling, submission of specimen other than nasopharyngeal swab, presence of viral mutation(s) within  the areas targeted by this assay, and inadequate number of viral copies(<138 copies/mL). A negative result must be combined with clinical observations, patient history, and epidemiological information. The expected result is Negative.  Fact Sheet for Patients:  EntrepreneurPulse.com.au  Fact Sheet for Healthcare Providers:  IncredibleEmployment.be  This test is no t yet approved or cleared by the [place] FDA and  has been authorized for detection and/or diagnosis of SARS-CoV-2 by FDA under an Emergency Use Authorization (EUA). This EUA will remain  in effect (meaning this test can be used) for the duration of the COVID-19 declaration under Section 564(b)(1) of the Act, 21 U.S.C.section 360bbb-3(b)(1), unless the authorization is terminated  or revoked sooner.       Influenza A by PCR NEGATIVE NEGATIVE   Influenza B by PCR NEGATIVE NEGATIVE    Comment: (NOTE) The Xpert Xpress SARS-CoV-2/FLU/RSV plus assay is intended as an aid in the diagnosis of influenza from Nasopharyngeal swab specimens and should not be used as a sole basis for treatment. Nasal washings and aspirates are unacceptable for Xpert Xpress SARS-CoV-2/FLU/RSV testing.  Fact Sheet for Patients: EntrepreneurPulse.com.au  Fact Sheet for Healthcare Providers: IncredibleEmployment.be  This test is not yet approved or cleared by the [place] FDA and has been authorized for detection and/or diagnosis of SARS-CoV-2 by FDA under an Emergency Use Authorization (EUA). This EUA will remain in effect (meaning this test can be used) for the duration of the COVID-19 declaration under Section 564(b)(1) of the Act, 21 U.S.C. section 360bbb-3(b)(1), unless the authorization is terminated or revoked.  Performed at [facility], [address], [place], [place] [address]   Lactic acid, plasma     Status: None   Collection Time: 08/01/21  3:40 AM   Result Value Ref Range   Lactic Acid, Venous 1.2 0.5 - 1.9 mmol/L    Comment: Performed at [facility], [address] [place], [place] [address]    Chemistries  Recent Labs  Lab 08/01/21 0120  NA 134*  K 3.9  CL 106  CO2 20*  GLUCOSE 134*  BUN 23  CREATININE 1.21*  CALCIUM 8.9  AST 49*  ALT 22  ALKPHOS 176*  BILITOT 1.4*   ------------------------------------------------------------------------------------------------------------------  ------------------------------------------------------------------------------------------------------------------ GFR: Estimated Creatinine Clearance: 32.2 mL/min (A) (by C-G formula based on SCr  of 1.21 mg/dL (H)). Liver Function Tests: Recent Labs  Lab 08/01/21 0120  AST 49*  ALT 22  ALKPHOS 176*  BILITOT 1.4*  PROT 6.9  ALBUMIN 3.1*   No results for input(s): LIPASE, AMYLASE in the last 168 hours. No results for input(s): AMMONIA in the last 168 hours. Coagulation Profile: No results for input(s): INR, PROTIME in the last 168 hours. Cardiac Enzymes: No results for input(s): CKTOTAL, CKMB, CKMBINDEX, TROPONINI in the last 168 hours. BNP (last 3 results) No results for input(s): PROBNP in the last 8760 hours. HbA1C: No results for input(s): HGBA1C in the last 72 hours. CBG: No results for input(s): GLUCAP in the last 168 hours. Lipid Profile: No results for input(s): CHOL, HDL, LDLCALC, TRIG, CHOLHDL, LDLDIRECT in the last 72 hours. Thyroid Function Tests: No results for input(s): TSH, T4TOTAL, FREET4, T3FREE, THYROIDAB in the last 72 hours. Anemia Panel: No results for input(s): VITAMINB12, FOLATE, FERRITIN, TIBC, IRON, RETICCTPCT in the last 72 hours.  --------------------------------------------------------------------------------------------------------------- Urine analysis:    Component Value Date/Time   COLORURINE YELLOW 01/21/2018 1615   APPEARANCEUR CLOUDY (A) 01/21/2018 1615   LABSPEC 1.014 01/21/2018 1615    PHURINE 6.0 01/21/2018 1615   GLUCOSEU NEGATIVE 01/21/2018 1615   HGBUR NEGATIVE 01/21/2018 1615   BILIRUBINUR NEGATIVE 01/21/2018 1615   KETONESUR NEGATIVE 01/21/2018 1615   PROTEINUR 100 (A) 01/21/2018 1615   UROBILINOGEN 0.2 12/05/2011 1037   NITRITE POSITIVE (A) 01/21/2018 1615   LEUKOCYTESUR LARGE (A) 01/21/2018 1615      Imaging Results:    DG Chest 2 View  Result Date: 08/01/2021 CLINICAL DATA:  Fever and tachycardia. EXAM: CHEST - 2 VIEW COMPARISON:  Mar 21, 2021 FINDINGS: There is stable right-sided venous Port-A-Cath positioning. There is no evidence of acute infiltrate, pleural effusion or pneumothorax. The heart size and mediastinal contours are within normal limits. Mild to moderate severity calcification of the aortic arch is seen. The visualized skeletal structures are unremarkable. IMPRESSION: No active cardiopulmonary disease. Electronically Signed   By: Virgina Norfolk M.D.   On: 08/01/2021 02:10    My personal review of EKG: A. fib with RVR rate of 126, QTc 503  Assessment & Plan:    Active Problems:   New onset atrial fibrillation ([place])   New onset atrial fibrillation with RVR Patient is started on Cardizem drip for rate control, but was not able to tolerate Consult cardiology CHA2DS2-VASc 5 Continue aspirin Etiology could be secondary to infection in the setting of fever-on the source has not been identified for worsening murmur.  Echo this a.m. Monitor on telemetry Liver cirrhosis Mentation is normal at this time.  Check ammonia if mentation should change.  Lactulose as needed for constipation Hypertension Patient had not been taking her nadolol at home.  Holding any beta-blockers given the pauses before.  Blood pressure is well controlled in the ED at 105/67 GERD Continue Protonix    DVT Prophylaxis-   Heparin- SCDs   AM Labs Ordered, also please review Full Orders  Family Communication: Admission, patients condition and plan of care including  tests being ordered have been discussed with the patient and husband who indicate understanding and agree with the plan and Code Status.  Code Status: Full  Admission status: ObservationTime spent in minutes : [place]

## 2021-08-01 NOTE — ED Notes (Signed)
ED Provider at bedside. 

## 2021-08-01 NOTE — Consult Note (Addendum)
Cardiology Consultation:   Patient ID: [name] MRN: [id number]; DOB: June 22, 1930  Admit date: 08/01/2021 Date of Consult: 08/01/2021  PCP:  Adrienne Squibb, MD   [place] [facility] Cardiologist: New to [facility]  Patient Profile:   [name] is a 87 y.o. female with a hx of breast cancer (metastatic to the skin and s/p chemotherapy and being followed by outpatient palliative care), aortic stenosis, Stage 3 CKD, cirrhosis (work-up in 09/2020 showed Grade 1 varices, esophagitis and erosive gastropathy), history of GIB (occurring in 09/2020), HTN and HLD who is being seen 08/01/2021 for the evaluation of atrial fibrillation with RVR at the request of Dr. Clearence Ped.  History of Present Illness:   [name] presented to [facility] ED during the early morning hours of 08/01/2021 for evaluation of a fever and elevated HR starting after she received her flu shot the day prior to admission. In talking with the patient today, she reports having her flu shot yesterday and felt "off" throughout the day. She is aware she had a fever a majority of the day but denies any associated symptoms. Says that her heart rate was elevated at home but denies any associated chest pain or palpitations. No recent dyspnea on exertion, orthopnea, PND or lower extremity edema. She is alert and oriented x3 at the time of this encounter but repeats herself frequently. Says that her husband helps with her medical issues but he is not currently at the bedside. She is unaware of having been evaluated by a Cardiologist in the past. No known history of CAD or CHF. Reports being told in the past that she had a "heart murmur".  Initial temperature upon arrival was 21 F and also found to be in atrial fibrillation with RVR with HR in the 140's. Was started on IV Cardizem but this has since been discontinued given pauses (frequent pauses up to 2 seconds and the longest being 4.7 seconds). Currently not on any AV nodal blocking agents  and HR in the low-100's (was on Nadolol 20mg  daily prior to admission due to her cirrhosis). Initial labs show WBC 7.8, Hgb 12.0, platelets 185, Na+ 134, K+ 3.9 and creatinine 1.21 (close to baseline). TSH 1.633. Mg 1.8. Ammonia 67. COVID negative. Lactic Acid 2.1. Blood cultures pending. CXR showing no active cardiopulmonary disease. Echocardiogram pending but prior imaging from 03/2020 showed a preserved EF of 65-70% with no regional WMA. Was noted to have mild LVH, Grade 1 DD, normal RV function, mild LA dilation and severe aortic stenosis with a mean gradient of 44 mmHg.    Past Medical History:  Diagnosis Date   Aortic stenosis    Cancer ([place]) 06/2018   right breast cancer   Cirrhosis ([place])    GERD (gastroesophageal reflux disease)    occasional    Hyperlipidemia    Hypertension    clearance with note Dr Nevada Crane on chart   Pneumonia    2 years ago/ states occ cough with sinus drainage- no fever   Thyroid nodule    with biopsy- states following medically    Past Surgical History:  Procedure Laterality Date   CATARACT EXTRACTION [facility]  11/30/2012   Procedure: CATARACT EXTRACTION PHACO AND INTRAOCULAR LENS PLACEMENT ([place]);  Surgeon: Williams Che, MD;  Location: [facility] ORS;  Service: Ophthalmology;  Laterality: Left;  CDE:11.49   CATARACT EXTRACTION W/PHACO Right 03/15/2013   Procedure: CATARACT EXTRACTION PHACO AND INTRAOCULAR LENS PLACEMENT ([facility]);  Surgeon: Williams Che, MD;  Location: [facility]  ORS;  Service: Ophthalmology;  Laterality: Right;  CDE 16.15   COLONOSCOPY     ESOPHAGOGASTRODUODENOSCOPY (EGD) WITH PROPOFOL N/A 10/07/2020   Dr. 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  Distal esophageal erosion/excoriations overlying varices.  More consistent with trauma/reflux than variceal bleeding stigmata.  Stomach incompletely seen due to presence of old blood.  Moderate sized hiatal hernia.  4 mm antral erosion, innocent appearing.  Repeat EGD in 2  days.   ESOPHAGOGASTRODUODENOSCOPY (EGD) WITH PROPOFOL N/A 10/11/2020   Dr. Laural Golden: Grade 1 varices in the distal esophagus.  LA grade B esophagitis, 1 benign-appearing intrinsic mild stenosis at 34 cm from the incisor, 4 cm hiatal hernia, single erosion in the gastric antrum and prepyloric region.   EXCISION OF SKIN TAG Right 06/17/2016   Procedure: SHAVE OF SKIN TAG RIGHT BUTTOCK;  Surgeon: Aviva Signs, MD;  Location: [facility];  Service: General;  Laterality: Right;   JOINT REPLACEMENT     left knee   MASS EXCISION Left 06/17/2016   Procedure: EXCISION SKIN MALIGNANT LESION LEFT BUTTOCK;  Surgeon: Aviva Signs, MD;  Location: [facility];  Service: General;  Laterality: Left;   PORTACATH PLACEMENT Right 07/28/2018   Procedure: INSERTION PORT-A-CATH WITH ULTRASOUND;  Surgeon: Rolm Bookbinder, MD;  Location: [place];  Service: General;  Laterality: Right;   PUNCH BIOPSY OF SKIN Right 07/28/2018   Procedure: PUNCH BIOPSY OF SKIN RIGHT BREAST;  Surgeon: Rolm Bookbinder, MD;  Location: [place];  Service: General;  Laterality: Right;   TOTAL KNEE ARTHROPLASTY  12/16/2011   Procedure: TOTAL KNEE ARTHROPLASTY;  Surgeon: Gearlean Alf, MD;  Location: [facility];  Service: Orthopedics;  Laterality: Right;   TUBAL LIGATION       Home Medications:  Prior to Admission medications   Medication Sig Start Date End Date Taking? Authorizing Provider  aspirin EC 81 MG tablet Take 1 tablet (81 mg total) by mouth daily. Swallow whole. 10/16/20 10/16/21  Nita Sells, MD  hydrocortisone 1 % ointment Apply 1 application topically 2 (two) times daily. Patient taking differently: Apply 1 application topically as needed. 09/18/18   Lockamy, Randi L, NP-C  lactulose (CHRONULAC) 10 GM/15ML solution Take 30 mLs (20 g total) by mouth 2 (two) times daily. Patient taking differently: Take 20 g by mouth as needed. 10/12/20   Nita Sells, MD  Misc Natural Products (ESSIAC  TONIC PO) Take by mouth daily.    [provider]  Multiple Vitamin (MULTIVITAMIN WITH MINERALS) TABS tablet Take 1 tablet by mouth daily.    [provider]  nadolol (CORGARD) 20 MG tablet Take 1 tablet (20 mg total) by mouth daily. 07/04/21   Mahala Menghini, PA-C  ONETOUCH ULTRA test strip  02/06/21   [provider]  OVER THE COUNTER MEDICATION Fanbendosol once daily    [provider]  pantoprazole (PROTONIX) 40 MG tablet Take 1 tablet (40 mg total) by mouth 2 (two) times daily before a meal. Patient taking differently: Take 40 mg by mouth daily. 10/12/20   Nita Sells, MD    Inpatient Medications: Scheduled Meds:  aspirin EC  81 mg Oral Daily   heparin  5,000 Units Subcutaneous Q8H   insulin aspart  0-6 Units Subcutaneous TID WC   metoprolol tartrate  12.5 mg Oral BID   pantoprazole  40 mg Oral Daily   Continuous Infusions:  sodium chloride 75 mL/hr at 08/01/21 0619   diltiazem (CARDIZEM) infusion Stopped (08/01/21  0512)   PRN Meds: acetaminophen **OR** acetaminophen, lactulose, morphine injection, ondansetron **OR** ondansetron (ZOFRAN) IV  Allergies:    Allergies  Allergen Reactions   Penicillins Rash    Has patient had a PCN reaction causing immediate rash, facial/tongue/throat swelling, SOB or lightheadedness with hypotension: No Has patient had a PCN reaction causing severe rash involving mucus membranes or skin necrosis: No Has patient had a PCN reaction that required hospitalization: No Has patient had a PCN reaction occurring within the last 10 years: No If all of the above answers are "NO", then may proceed with Cephalosporin use.     Social History:   Social History   Socioeconomic History   Marital status: Married    Spouse name: Not on file   Number of children: 2   Years of education: some business school after HS   Highest education level: Not on file  Occupational History   Occupation: Cabin crew   Occupation:  Network engineer   Tobacco Use   Smoking status: Never   Smokeless tobacco: Never  Vaping Use   Vaping Use: Never used  Substance and Sexual Activity   Alcohol use: No   Drug use: No   Sexual activity: Yes    Birth control/protection: None  Other Topics Concern   Not on file  Social History Narrative   Lives at home with her husband.   4 cups caffeine per day.   Right-handed.   Social Determinants of Health   Financial Resource Strain: Low Risk    Difficulty of Paying Living Expenses: Not hard at all  Food Insecurity: No Food Insecurity   Worried About Charity fundraiser in the Last Year: Never true   [place] in the Last Year: Never true  Transportation Needs: No Transportation Needs   Lack of Transportation (Medical): No   Lack of Transportation (Non-Medical): No  Physical Activity: Not on file  Stress: Not on file  Social Connections: Not on file  Intimate Partner Violence: Not At Risk   Fear of Current or Ex-Partner: No   Emotionally Abused: No   Physically Abused: No   Sexually Abused: No    Family History:    Family History  Problem Relation Age of Onset   Heart attack Mother    Bronchitis Father    Diabetes Sister    Leukemia Brother    Lung disease Brother    Lung disease Sister      ROS:  Please see the history of present illness.   All other ROS reviewed and negative.     Physical Exam/Data:   Vitals:   08/01/21 0645 08/01/21 0700 08/01/21 0715 08/01/21 0730  BP: 123/82 (!) 125/96 129/88 114/83  Pulse: 85 (!) 113 (!) 113 (!) 104  Resp: 17 18 (!) 22 17  Temp:      TempSrc:      SpO2: 92% 92% 91% 93%  Weight:      Height:        Intake/Output Summary (Last 24 hours) at 08/01/2021 1009 Last data filed at 08/01/2021 0519 Gross per 24 hour  Intake 2.39 ml  Output --  Net 2.39 ml   Last 3 Weights 08/01/2021 07/04/2021 04/18/2021  Weight (lbs) 176 lb 5.9 oz 176 lb 6.4 oz 169 lb 8.5 oz  Weight (kg) 80 kg 80.015 kg 76.9 kg     Body mass index  is 30.27 kg/m.  General: Pleasant elderly female appearing in no acute distress HEENT: normal Neck: no  JVD Vascular: No carotid bruits; Distal pulses 2+ bilaterally Cardiac:  Irregularly irregular, 3/6 SEM along RUSB.  Lungs:  clear to auscultation bilaterally, no wheezing, rhonchi or rales  Abd: soft, nontender, no hepatomegaly  Ext: trace ankle edema bilaterally Musculoskeletal:  No deformities, BUE and BLE strength normal and equal Skin: warm and dry  Neuro:  CNs 2-12 intact, no focal abnormalities noted Psych:  Normal affect   EKG:  The EKG was personally reviewed and demonstrates:  Atrial fibrillation with RVR, HR 126.  Telemetry:  Telemetry was personally reviewed and demonstrates: Atrial fibrillation, HR initially in the 140's, now in the 80's to 90's. Pauses up to 4.7 seconds overnight while on IV Cardizem.   Relevant CV Studies:  Echocardiogram: 03/2020 IMPRESSIONS     1. Left ventricular ejection fraction, by estimation, is 65 to 70%. The  left ventricle has normal function. The left ventricle has no regional  wall motion abnormalities. There is mild left ventricular hypertrophy.  Left ventricular diastolic parameters  are consistent with Grade I diastolic dysfunction (impaired relaxation).   2. Right ventricular systolic function is normal. The right ventricular  size is normal. There is normal pulmonary artery systolic pressure. The  estimated right ventricular systolic pressure is 91.6 mmHg.   3. Left atrial size was mildly dilated.   4. The mitral valve is grossly normal. Mild mitral valve regurgitation.   5. The aortic valve is tricuspid, moderately calcified with decreased  cusp excursion. Aortic valve regurgitation is trivial. Severe aortic valve  stenosis. Aortic valve area, by VTI measures 0.66 cm. Aortic valve mean  gradient measures 44.0 mmHg. Aortic   valve Vmax measures 4.13 m/s. Dimentionless index 0.26.   6. The inferior vena cava is normal in size  with greater than 50%  respiratory variability, suggesting right atrial pressure of 3 mmHg.   Laboratory Data:  High Sensitivity Troponin:  No results for input(s): TROPONINIHS in the last 720 hours.   Chemistry Recent Labs  Lab 08/01/21 0120  NA 134*  K 3.9  CL 106  CO2 20*  GLUCOSE 134*  BUN 23  CREATININE 1.21*  CALCIUM 8.9  MG 1.8  GFRNONAA 43*  ANIONGAP 8    Recent Labs  Lab 08/01/21 0120  PROT 6.9  ALBUMIN 3.1*  AST 49*  ALT 22  ALKPHOS 176*  BILITOT 1.4*   Lipids No results for input(s): CHOL, TRIG, HDL, LABVLDL, LDLCALC, CHOLHDL in the last 168 hours.  Hematology Recent Labs  Lab 08/01/21 0120  WBC 7.8  RBC 3.77*  HGB 12.0  HCT 35.0*  MCV 92.8  MCH 31.8  MCHC 34.3  RDW 15.5  PLT 185   Thyroid  Recent Labs  Lab 08/01/21 0120  TSH 1.633    BNPNo results for input(s): BNP, PROBNP in the last 168 hours.  DDimer No results for input(s): DDIMER in the last 168 hours.   Radiology/Studies:  DG Chest 2 View  Result Date: 08/01/2021 CLINICAL DATA:  Fever and tachycardia. EXAM: CHEST - 2 VIEW COMPARISON:  Mar 21, 2021 FINDINGS: There is stable right-sided venous Port-A-Cath positioning. There is no evidence of acute infiltrate, pleural effusion or pneumothorax. The heart size and mediastinal contours are within normal limits. Mild to moderate severity calcification of the aortic arch is seen. The visualized skeletal structures are unremarkable. IMPRESSION: No active cardiopulmonary disease. Electronically Signed   By: Virgina Norfolk M.D.   On: 08/01/2021 02:10     Assessment and Plan:   1. Atrial Fibrillation  complicated by Tachy-Brady Syndrome - Atrial fibrillation is a new diagnosis for the patient and per her report, she never had issues with an elevated HR prior to receiving her flu shot yesterday. Given her variable history, I did try calling her husband to verify this but no answer and voicemail was full.  - Mg was low at 1.8 and is being  replaced. TSH and K+ WNL. Echo pending.  - She was initially on IV Cardizem but developed pauses up to 4.7 seconds while on the drip and this was discontinued around 0500. She was on Nadolol for her cirrhosis prior to admission and had overall tolerated well. Reviewed with Dr. Domenic Polite and will try starting Lopressor 12.5mg  BID and titrate cautiously. Not an ideal PPM candidate given her multiple medical issues.  - Her CHA2DS2-VASc Score is at least 5. Given her history of prior GIB requiring transfusions and known cirrhosis with esophageal varices, likely not an ideal candidate for long-term anticoagulation. Will review with her husband as well as the patient seems to rely on him for most of her medical decisions based on our discussion today. Overall, would greatly benefit from a goals of care discussion given her multiple medical issues and she is already being followed by [facility] per review of the chart.   2. Aortic Stenosis - She reports being told she had a murmur in the past but has never been evaluated by Cardiology. Prior echocardiogram in 03/2020 showed severe AS with a mean gradient of 44 mmHg. Repeat imaging is pending but she would not be an ideal TAVR candidate given her known malignancy and already being followed by Palliative services.   3. Cirrhosis/History of GIB - Her GIB occurred in 09/2020 and she received transfusions that admission with EGD showing Grade 1 varices, esophagitis and erosive gastropathy. She is followed by GI as an outpatient and was on Lactulose and Nadolol 20mg  daily prior to admission.   4. Breast Cancer - By review of Oncology notes from 03/2021, she was not planning to pursue further palliative chemotherapy and was being followed by home-based Palliative Care ([facility] by review of Epic).    Risk Assessment/Risk Scores:   CHA2DS2-VASc Score = 5   This indicates a 7.2% annual risk of stroke. The patient's score is  based upon: CHF History: 0 HTN History: 1 Diabetes History: 0 Stroke History: 0 Vascular Disease History: 1 Age Score: 2 Gender Score: 1     For questions or updates, please contact [place] Please consult [email or web address] for contact info under    Signed, Erma Heritage, PA-C  08/01/2021 10:09 AM   Attending note:  Patient seen and examined, I reviewed her records and discussed the case with Ms. Delano Metz, I agree with her above findings.  Ms. Hilscher is a medically complex 87 year old woman currently undergoing home-based palliative care in the setting of breast cancer, also with cirrhosis/esophageal varices and history of GI bleed, severe aortic stenosis that has presumably been managed conservatively without cardiology involvement, now presenting with newly documented rapid atrial fibrillation.  She reports rapid heart rate after having influenza vaccine although she is somewhat of a tangential historian so absolute duration is uncertain.  She was placed on intravenous diltiazem in the ER initially, developed a 4.7 second pause resulting in discontinuation of infusion.  She had already been on nadolol as an outpatient.  CHA2DS2-VASc score is at least 5.  She is in no distress.  Initially febrile with temperature  102 degrees rectally, heart rate around 100 and atrial fibrillation, systolic blood pressure 532D to 140s.  Lungs are clear.  Cardiac exam with irregularly irregular rhythm and 3/6 systolic murmur consistent with aortic stenosis.  Trace ankle edema.  Pertinent lab work includes potassium 3.9, BUN 23, creatinine 1.21, ammonia 67, lactate 1.2 down from 2.1, hemoglobin 12.3, platelets 185, COVID-19 negative, urinalysis with hazy appearance and greater than 300 protein but rare bacteria, blood cultures sent and pending.  Chest x-ray reports no acute process.  I personally reviewed her ECG from today which shows atrial fibrillation with RVR, poor R wave progression.  Her  last echocardiogram in June 2021 revealed LVEF 65 to 70% with mild diastolic dysfunction, mildly dilated left atrium, mild mitral regurgitation, and severe calcific aortic stenosis with mean gradient 44 mmHg.  At this time would suggest attempting low-dose Lopressor 12.5 mg twice daily and titrate for heart rate control.  She is not a good candidate for anticoagulation despite increased thromboembolic risk score in light of her history of cirrhosis with esophageal varices and GI bleed.  She looks to have component of tachycardia-bradycardia syndrome, but would also avoid pacemaker placement if at all possible.  She has severe aortic stenosis which has likely progressed over the last year as well, but considering her comorbidities and home palliative care in place, would not pursue TAVR work-up.  Satira Sark, M.D., F.A.C.C.

## 2021-08-01 NOTE — ED Notes (Signed)
Admit Provider at bedside. 

## 2021-08-01 NOTE — Progress Notes (Signed)
PROGRESS NOTE    Patient: [name]                            PCP: [name], MD                    DOB: 1930-07-16            DOA: 08/01/2021 ION:[id number]             DOS: 08/01/2021, 11:06 AM   LOS: 0 days   Date of Service: The patient was seen and examined on 08/01/2021  Subjective:   The patient was seen and examined this morning. Stable at this time.  Afebrile -mildly hypotensive Not complaining of Chest pain or shortness of breath Otherwise no issues overnight .  Brief Narrative:   [name]  is a 87 y.o. female, with history of breast cancer, cirrhosis, GERD, hyperlipidemia, hypertension, thyroid nodule, MR presents ED with a chief complaint of fever.  Patient is very hard of hearing and husband at bedside helps to provide history.  They report that patient went for her routine wellness exam with her PCP, where she received her flu shot.  She started running a fever later measured at 101.5 according to husband.  He did not take any Tylenol or ibuprofen.  He also noticed on the blood pressure machine that her heart rate was up to 130.     She had no chest pain, no nausea, no vomiting, no diaphoresis.  She did have chills.  Patient has no history of arrhythmia.  She does have a history of a heart murmur.    Patient does not smoke, does not drink alcohol, does not use illicit drugs.  She had 2 vaccines for COVID.   Full code.   ED:  Temp 102, after Tylenol 99.5, heart rate 94-1 26, respiratory rate 16-21, blood pressure 105/67 EKG shows heart rate 126 A. fib QTC 503 Respiratory panel negative, blood cultures pending, UA pending Chest x-ray shows no active disease No leukocytosis with a white blood cell count of 7.8, hemoglobin 12.0 Chemistry panel reveals a creatinine at baseline of 1.21 1 L normal saline given in the ED,   Assessment & Plan:    Principal Problem:   New onset atrial fibrillation ([place]) Active Problems:   GI bleed   Aortic valvar stenosis    Hepatic cirrhosis (HCC)   Fever 106 degrees F or over   AKI (acute kidney injury) ([place])       Principal Problem:  New onset atrial fibrillation (HCC) Patient is started on Cardizem drip for rate control, but was not able to tolerate Consulting  cardiology CHA2DS2-VASc 5 - ? initiating chronic anticoagulation treatment for stroke prevention -We will further discuss risk and benefit of chronic anticoagulation with patient and cardiology -Patient has extensive history of GI bleed last year receiving blood transfusion. -We will obtain EKG, -2D echocardiogram -Continue telemetry monitored bed -Cardiology consulted-appreciate input  Febrile illness -No leukocytosis, lactic acid , afebrile now  -Likely reactive versus viral infection -Afebrile at this time-as needed Tylenol -Monitoring closely, abstaining from antibiotic use at this time     Active Problems:   H/o  GI bleed -2021,  Upper GI bleed from gastric varices status post EGD 10/07/2020 repeated 10/12/2020 -Was placed on nadolol and PPI  -Monitoring H&H currently stable -Approximately a year ago had a GI bleed receiving 2 units of blood transfusion  H/o Aortic valvar stenosis -Obtain 2D echocardiogram for further evaluation -Cardiology consulted, appreciate input   Hepatic cirrhosis ([place])   -Monitoring LFTs, as needed Tylenol, avoiding statins, - Monitoring ammonia level -Continue as needed lactulose         AKI (acute kidney injury) ([place]) -Continue gentle IV fluid hydration   GERD: -History of upper GI bleed, gastric varices -Continue PPI -On Protonix   Hypertension -BP running mildly soft, with holding BP meds, monitoring closely   History of metastatic right breast cancer -Patient has been on chemo -Continue outpatient follow-up with Dr. [name]   ----------------------------------------------------------------------------------------------------------------------------------------------- Nutritional status:  The patient's BMI is: Body mass index is 30.27 kg/m. I agree with the assessment and plan as outlined below: Nutrition Status:   -------------------------------------------------------------------------------------------------------------------------------- Cultures; Blood cultures x2     Antimicrobials: None      Consultants: Cardiology    ---------------------------------------------------------------------------------------------------------------------------------   DVT prophylaxis:  SCD/Compression stockings and Heparin SQ Code Status:   Code Status: Full Code   Family Communication: None at bedside, will try to reach her husband The above findings and plan of care has been discussed with patient (and family)  in detail,  they expressed understanding and agreement of above. -Advance care planning has been discussed.    Admission status:   Status is: Observation   The patient remains OBS appropriate and will d/c before 2 midnights.   Dispo: The patient is from: Home              Anticipated d/c is to: Home in 1-2 days               Patient currently is not medically stable to d/c.              Difficult to place patient No      Level of care: Stepdown   Procedures:   No admission procedures for hospital encounter.    Antimicrobials:  Anti-infectives (From admission, onward)    None        Medication:   aspirin EC  81 mg Oral Daily   heparin  5,000 Units Subcutaneous Q8H   insulin aspart  0-6 Units Subcutaneous TID WC   metoprolol tartrate  12.5 mg Oral BID   pantoprazole  40 mg Oral Daily    acetaminophen **OR** acetaminophen, lactulose, morphine injection, ondansetron **OR** ondansetron (ZOFRAN) IV   Objective:   Vitals:   08/01/21 0645 08/01/21 0700 08/01/21 0715 08/01/21  0730  BP: 123/82 (!) 125/96 129/88 114/83  Pulse: 85 (!) 113 (!) 113 (!) 104  Resp: 17 18 (!) 22 17  Temp:      TempSrc:      SpO2: 92% 92% 91% 93%  Weight:      Height:        Intake/Output Summary (Last 24 hours) at 08/01/2021 1106 Last data filed at 08/01/2021 2426 Gross per 24 hour  Intake 2.39 ml  Output --  Net 2.39 ml   Filed Weights   08/01/21 0106  Weight: 80 kg     Examination:   Physical Exam  Constitution:  Alert, cooperative, no distress,  Appears calm and comfortable  Psychiatric: Normal and stable mood and affect, cognition intact,   HEENT: Normocephalic, PERRL, otherwise with in Normal limits  Chest:Chest symmetric Cardio vascular:  S1/S2, irregularly irregular, crescendo decrescendo murmur, No Rubs or Gallops  pulmonary: Clear to auscultation bilaterally, respirations unlabored, negative wheezes / crackles Abdomen: Soft, non-tender, non-distended, bowel sounds,no masses, no organomegaly Muscular skeletal: Limited  exam - in bed, able to move all 4 extremities, Normal strength,  Neuro: CNII-XII intact. , normal motor and sensation, reflexes intact  Extremities: No pitting edema lower extremities, +2 pulses  Skin: Dry, warm to touch, negative for any Rashes, No open wounds Wounds: per nursing documentation    ------------------------------------------------------------------------------------------------------------------------------------------    LABs:  CBC Latest Ref Rng & Units 08/01/2021 04/18/2021 03/21/2021  WBC 4.0 - 10.5 K/uL 7.8 7.9 7.5  Hemoglobin 12.0 - 15.0 g/dL 12.0 11.8(L) 11.8(L)  Hematocrit 36.0 - 46.0 % 35.0(L) 35.4(L) 34.9(L)  Platelets 150 - 400 K/uL 185 181 123(L)   CMP Latest Ref Rng & Units 08/01/2021 04/18/2021 03/21/2021  Glucose 70 - 99 mg/dL 134(H) 102(H) 117(H)  BUN 8 - 23 mg/dL 23 27(H) 38(H)  Creatinine 0.44 - 1.00 mg/dL 1.21(H) 1.18(H) 1.26(H)  Sodium 135 - 145 mmol/L 134(L) 138 135  Potassium 3.5 - 5.1 mmol/L 3.9 3.7 3.5   Chloride 98 - 111 mmol/L 106 109 108  CO2 22 - 32 mmol/L 20(L) 20(L) 19(L)  Calcium 8.9 - 10.3 mg/dL 8.9 9.1 8.9  Total Protein 6.5 - 8.1 g/dL 6.9 6.6 -  Total Bilirubin 0.3 - 1.2 mg/dL 1.4(H) 0.8 -  Alkaline Phos 38 - 126 U/L 176(H) 169(H) -  AST 15 - 41 U/L 49(H) 40 -  ALT 0 - 44 U/L 22 21 -       Micro Results Recent Results (from the past 240 hour(s))  Culture, blood (routine x 2)     Status: None (Preliminary result)   Collection Time: 08/01/21  1:20 AM   Specimen: BLOOD  Result Value Ref Range Status   Specimen Description BLOOD BLOOD RIGHT WRIST  Final   Special Requests   Final    Blood Culture adequate volume BOTTLES DRAWN AEROBIC AND ANAEROBIC   Culture   Final    NO GROWTH < 12 HOURS Performed at [facility], [address], [place], [place] [address]    Report Status PENDING  Incomplete  Culture, blood (routine x 2)     Status: None (Preliminary result)   Collection Time: 08/01/21  1:20 AM   Specimen: BLOOD RIGHT ARM  Result Value Ref Range Status   Specimen Description BLOOD RIGHT ARM  Final   Special Requests   Final    BOTTLES DRAWN AEROBIC AND ANAEROBIC Blood Culture adequate volume   Culture   Final    NO GROWTH < 12 HOURS Performed at [facility], [address] [place], [place] [address]    Report Status PENDING  Incomplete  Resp Panel by RT-PCR (Flu A&B, Covid) Nasopharyngeal Swab     Status: None   Collection Time: 08/01/21  2:03 AM   Specimen: Nasopharyngeal Swab; Nasopharyngeal(NP) swabs in vial transport medium  Result Value Ref Range Status   SARS Coronavirus 2 by RT PCR NEGATIVE NEGATIVE Final    Comment: (NOTE) SARS-CoV-2 target nucleic acids are NOT DETECTED.  The SARS-CoV-2 RNA is generally detectable in upper respiratory specimens during the acute phase of infection. The lowest concentration of SARS-CoV-2 viral copies this assay can detect is 138 copies/mL. A negative result does not preclude SARS-Cov-2 infection and should not be  used as the sole basis for treatment or other patient management decisions. A negative result may occur with  improper specimen collection/handling, submission of specimen other than nasopharyngeal swab, presence of viral mutation(s) within the areas targeted by this assay, and inadequate number of viral copies(<138 copies/mL). A negative result must be combined  with clinical observations, patient history, and epidemiological information. The expected result is Negative.  Fact Sheet for Patients:  EntrepreneurPulse.com.au  Fact Sheet for Healthcare Providers:  IncredibleEmployment.be  This test is no t yet approved or cleared by the [place] FDA and  has been authorized for detection and/or diagnosis of SARS-CoV-2 by FDA under an Emergency Use Authorization (EUA). This EUA will remain  in effect (meaning this test can be used) for the duration of the COVID-19 declaration under Section 564(b)(1) of the Act, 21 U.S.C.section 360bbb-3(b)(1), unless the authorization is terminated  or revoked sooner.       Influenza A by PCR NEGATIVE NEGATIVE Final   Influenza B by PCR NEGATIVE NEGATIVE Final    Comment: (NOTE) The Xpert Xpress SARS-CoV-2/FLU/RSV plus assay is intended as an aid in the diagnosis of influenza from Nasopharyngeal swab specimens and should not be used as a sole basis for treatment. Nasal washings and aspirates are unacceptable for Xpert Xpress SARS-CoV-2/FLU/RSV testing.  Fact Sheet for Patients: EntrepreneurPulse.com.au  Fact Sheet for Healthcare Providers: IncredibleEmployment.be  This test is not yet approved or cleared by the [place] FDA and has been authorized for detection and/or diagnosis of SARS-CoV-2 by FDA under an Emergency Use Authorization (EUA). This EUA will remain in effect (meaning this test can be used) for the duration of the COVID-19 declaration under Section  564(b)(1) of the Act, 21 U.S.C. section 360bbb-3(b)(1), unless the authorization is terminated or revoked.  Performed at [facility], [address], [place], [place] [address]     Radiology Reports DG Chest 2 View  Result Date: 08/01/2021 CLINICAL DATA:  Fever and tachycardia. EXAM: CHEST - 2 VIEW COMPARISON:  Mar 21, 2021 FINDINGS: There is stable right-sided venous Port-A-Cath positioning. There is no evidence of acute infiltrate, pleural effusion or pneumothorax. The heart size and mediastinal contours are within normal limits. Mild to moderate severity calcification of the aortic arch is seen. The visualized skeletal structures are unremarkable. IMPRESSION: No active cardiopulmonary disease. Electronically Signed   By: Virgina Norfolk M.D.   On: 08/01/2021 02:10    SIGNED: Deatra [name], MD, FHM. [facility],  Pager (please use [email or web address] to page/text) Please use Epic Secure Chat for non-urgent communication (7AM-7PM)  If 7PM-7AM, please contact night-coverage [email or web address], 08/01/2021, 11:06 AM

## 2021-08-02 DIAGNOSIS — I350 Nonrheumatic aortic (valve) stenosis: Secondary | ICD-10-CM | POA: Diagnosis not present

## 2021-08-02 DIAGNOSIS — I4891 Unspecified atrial fibrillation: Secondary | ICD-10-CM | POA: Diagnosis not present

## 2021-08-02 LAB — CBC WITH DIFFERENTIAL/PLATELET
Abs Immature Granulocytes: 0.03 10*3/uL (ref 0.00–0.07)
Basophils Absolute: 0.1 10*3/uL (ref 0.0–0.1)
Basophils Relative: 1 %
Eosinophils Absolute: 0.5 10*3/uL (ref 0.0–0.5)
Eosinophils Relative: 8 %
HCT: 31 % — ABNORMAL LOW (ref 36.0–46.0)
Hemoglobin: 10.1 g/dL — ABNORMAL LOW (ref 12.0–15.0)
Immature Granulocytes: 0 %
Lymphocytes Relative: 25 %
Lymphs Abs: 1.7 10*3/uL (ref 0.7–4.0)
MCH: 31 pg (ref 26.0–34.0)
MCHC: 32.6 g/dL (ref 30.0–36.0)
MCV: 95.1 fL (ref 80.0–100.0)
Monocytes Absolute: 1.1 10*3/uL — ABNORMAL HIGH (ref 0.1–1.0)
Monocytes Relative: 15 %
Neutro Abs: 3.5 10*3/uL (ref 1.7–7.7)
Neutrophils Relative %: 51 %
Platelets: 152 10*3/uL (ref 150–400)
RBC: 3.26 MIL/uL — ABNORMAL LOW (ref 3.87–5.11)
RDW: 16 % — ABNORMAL HIGH (ref 11.5–15.5)
WBC: 6.9 10*3/uL (ref 4.0–10.5)
nRBC: 0 % (ref 0.0–0.2)

## 2021-08-02 LAB — COMPREHENSIVE METABOLIC PANEL
ALT: 21 U/L (ref 0–44)
AST: 38 U/L (ref 15–41)
Albumin: 2.5 g/dL — ABNORMAL LOW (ref 3.5–5.0)
Alkaline Phosphatase: 132 U/L — ABNORMAL HIGH (ref 38–126)
Anion gap: 5 (ref 5–15)
BUN: 29 mg/dL — ABNORMAL HIGH (ref 8–23)
CO2: 21 mmol/L — ABNORMAL LOW (ref 22–32)
Calcium: 8 mg/dL — ABNORMAL LOW (ref 8.9–10.3)
Chloride: 110 mmol/L (ref 98–111)
Creatinine, Ser: 1.46 mg/dL — ABNORMAL HIGH (ref 0.44–1.00)
GFR, Estimated: 34 mL/min — ABNORMAL LOW (ref >60)
Glucose, Bld: 91 mg/dL (ref 70–99)
Potassium: 3.8 mmol/L (ref 3.5–5.1)
Sodium: 136 mmol/L (ref 135–145)
Total Bilirubin: 1.1 mg/dL (ref 0.3–1.2)
Total Protein: 5.5 g/dL — ABNORMAL LOW (ref 6.5–8.1)

## 2021-08-02 LAB — GLUCOSE, CAPILLARY: Glucose-Capillary: 96 mg/dL (ref 70–99)

## 2021-08-02 MED ORDER — METOPROLOL TARTRATE 25 MG PO TABS: 12.5000 mg | ORAL_TABLET | Freq: Two times a day (BID) | ORAL | 2 refills | Status: AC

## 2021-08-02 NOTE — Discharge Instructions (Addendum)
Discontinue home nadolol in favor of metoprolol tartrate 12.5 mg p.o. twice daily per cardiology recommendations

## 2021-08-02 NOTE — Progress Notes (Signed)
Progress Note  Patient Name: [name] Date of Encounter: 08/02/2021  Primary Cardiologist: New to [facility]  Subjective   No shortness of breath or chest pain. No palpitations.  Inpatient Medications    Scheduled Meds:  aspirin EC  81 mg Oral Daily   heparin  5,000 Units Subcutaneous Q8H   insulin aspart  0-6 Units Subcutaneous TID WC   metoprolol tartrate  12.5 mg Oral BID   pantoprazole  40 mg Oral Daily   Continuous Infusions:  diltiazem (CARDIZEM) infusion Stopped (08/01/21 0512)   PRN Meds: acetaminophen **OR** acetaminophen, lactulose, morphine injection, ondansetron **OR** ondansetron (ZOFRAN) IV   Vital Signs    Vitals:   08/01/21 1400 08/01/21 1556 08/01/21 2008 08/02/21 0610  BP: (!) 158/91 134/66 (!) 136/56 (!) 151/69  Pulse: 75 (!) 58 69 70  Resp:  20 15 17   Temp: 97.7 F (36.5 C) 98 F (36.7 C) 98.2 F (36.8 C) 98.1 F (36.7 C)  TempSrc: Oral  Oral Oral  SpO2: 92% 100% 99% 100%  Weight:      Height:        Intake/Output Summary (Last 24 hours) at 08/02/2021 0859 Last data filed at 08/02/2021 0636 Gross per 24 hour  Intake 1000 ml  Output 500 ml  Net 500 ml   Filed Weights   08/01/21 0106  Weight: 80 kg    Telemetry    Sinus rhythm.  Personally reviewed.  ECG    An ECG dated 08/01/2021 was personally reviewed today and demonstrated:  Atrial fibrillation with poor R wave progression.  Physical Exam   GEN: Elderly woman.  No acute distress.   Neck: No JVD. Cardiac: RRR, 3/6 systolic murmur, no gallop.  Respiratory: Nonlabored. Clear to auscultation bilaterally. GI: Soft, nontender, bowel sounds present. MS: No edema.  Labs    Chemistry Recent Labs  Lab 08/01/21 0120 08/02/21 0513  NA 134* 136  K 3.9 3.8  CL 106 110  CO2 20* 21*  GLUCOSE 134* 91  BUN 23 29*  CREATININE 1.21* 1.46*  CALCIUM 8.9 8.0*  PROT 6.9 5.5*  ALBUMIN 3.1* 2.5*  AST 49* 38  ALT 22 21  ALKPHOS 176* 132*  BILITOT 1.4* 1.1  GFRNONAA 43* 34*   ANIONGAP 8 5     Hematology Recent Labs  Lab 08/01/21 0120 08/02/21 0513  WBC 7.8 6.9  RBC 3.77* 3.26*  HGB 12.0 10.1*  HCT 35.0* 31.0*  MCV 92.8 95.1  MCH 31.8 31.0  MCHC 34.3 32.6  RDW 15.5 16.0*  PLT 185 152    Radiology    DG Chest 2 View  Result Date: 08/01/2021 CLINICAL DATA:  Fever and tachycardia. EXAM: CHEST - 2 VIEW COMPARISON:  Mar 21, 2021 FINDINGS: There is stable right-sided venous Port-A-Cath positioning. There is no evidence of acute infiltrate, pleural effusion or pneumothorax. The heart size and mediastinal contours are within normal limits. Mild to moderate severity calcification of the aortic arch is seen. The visualized skeletal structures are unremarkable. IMPRESSION: No active cardiopulmonary disease. Electronically Signed   By: Virgina Norfolk M.D.   On: 08/01/2021 02:10   ECHOCARDIOGRAM COMPLETE  Result Date: 08/01/2021    ECHOCARDIOGRAM REPORT   Patient Name:   [name] Date of Exam: 08/01/2021 Medical Rec #:  [id number]    Height:       64.0 in Accession #:    [id number]   Weight:       176.4 lb Date of Birth:  Nov 10, 1929  BSA:          1.855 m Patient Age:    87 years     BP:           114/83 mmHg Patient Gender: F            HR:           104 bpm. Exam Location:  [facility] Procedure: 2D Echo, Cardiac Doppler and Color Doppler Indications:    Atrial Fibrillation  History:        Patient has prior history of Echocardiogram examinations, most                 recent 04/03/2020. Arrythmias:Atrial Fibrillation,                 Signs/Symptoms:Syncope; Risk Factors:Dyslipidemia. Breast CA.  Sonographer:    Wenda Low Referring Phys: [id number] ASIA B Martin  1. Left ventricular ejection fraction, by estimation, is 55 to 60%. The left ventricle has normal function. The left ventricle has no regional wall motion abnormalities. There is moderate asymmetric left ventricular hypertrophy of the septal segment. Left ventricular diastolic parameters  are indeterminate.  2. Right ventricular systolic function is normal. The right ventricular size is normal. There is moderately elevated pulmonary artery systolic pressure. The estimated right ventricular systolic pressure is 44.3 mmHg.  3. Left atrial size was mildly dilated.  4. The mitral valve is abnormal. Trivial mitral valve regurgitation. The mean mitral valve gradient is 4.0 mmHg. Moderate mitral annular calcification.  5. Tricuspid valve regurgitation is moderate.  6. The aortic valve is tricuspid. There is severe calcifcation of the aortic valve. Severe aortic valve stenosis. Aortic valve mean gradient measures 35.7 mmHg. Dimentionless index 0.22.  7. The inferior vena cava is dilated in size with <50% respiratory variability, suggesting right atrial pressure of 15 mmHg. Comparison(s): Prior images reviewed side by side. Aortic stenosis severe as before, although mean gradient measures somewhat less at 36 mmHg. FINDINGS  Left Ventricle: Left ventricular ejection fraction, by estimation, is 55 to 60%. The left ventricle has normal function. The left ventricle has no regional wall motion abnormalities. The left ventricular internal cavity size was normal in size. There is  moderate asymmetric left ventricular hypertrophy of the septal segment. Left ventricular diastolic parameters are indeterminate. Right Ventricle: The right ventricular size is normal. No increase in right ventricular wall thickness. Right ventricular systolic function is normal. There is moderately elevated pulmonary artery systolic pressure. The tricuspid regurgitant velocity is 2.81 m/s, and with an assumed right atrial pressure of 15 mmHg, the estimated right ventricular systolic pressure is 15.4 mmHg. Left Atrium: Left atrial size was mildly dilated. Right Atrium: Right atrial size was normal in size. Pericardium: There is no evidence of pericardial effusion. Presence of pericardial fat pad. Mitral Valve: The mitral valve is abnormal.  There is mild thickening of the mitral valve leaflet(s). There is mild calcification of the mitral valve leaflet(s). Moderate mitral annular calcification. Trivial mitral valve regurgitation. MV peak gradient, 6.5 mmHg. The mean mitral valve gradient is 4.0 mmHg. Tricuspid Valve: The tricuspid valve is grossly normal. Tricuspid valve regurgitation is moderate. Aortic Valve: The aortic valve is tricuspid. There is severe calcifcation of the aortic valve. There is mild aortic valve annular calcification. Aortic valve regurgitation is not visualized. Severe aortic stenosis is present. Aortic valve mean gradient measures 35.7 mmHg. Aortic valve peak gradient measures 63.6 mmHg. Aortic valve area, by VTI measures 0.69 cm. Pulmonic Valve: The pulmonic  valve was grossly normal. Pulmonic valve regurgitation is trivial. Aorta: The aortic root is normal in size and structure. Venous: The inferior vena cava is dilated in size with less than 50% respiratory variability, suggesting right atrial pressure of 15 mmHg. IAS/Shunts: No atrial level shunt detected by color flow Doppler.  LEFT VENTRICLE PLAX 2D LVIDd:         3.80 cm  Diastology LVIDs:         2.70 cm  LV e' medial:    5.77 cm/s LV PW:         1.40 cm  LV E/e' medial:  20.5 LV IVS:        1.50 cm  LV e' lateral:   6.09 cm/s LVOT diam:     2.00 cm  LV E/e' lateral: 19.4 LV SV:         68 LV SV Index:   37 LVOT Area:     3.14 cm  RIGHT VENTRICLE RV Basal diam:  3.65 cm RV Mid diam:    2.50 cm RV S prime:     8.59 cm/s TAPSE (M-mode): 2.6 cm LEFT ATRIUM             Index       RIGHT ATRIUM           Index LA diam:        3.50 cm 1.89 cm/m  RA Area:     18.70 cm LA Vol (A2C):   61.6 ml 33.22 ml/m RA Volume:   52.20 ml  28.15 ml/m LA Vol (A4C):   61.9 ml 33.38 ml/m LA Biplane Vol: 61.1 ml 32.95 ml/m  AORTIC VALVE                    PULMONIC VALVE AV Area (Vmax):    0.61 cm     PV Vmax:       0.69 m/s AV Area (Vmean):   0.62 cm     PV Peak grad:  1.9 mmHg AV Area  (VTI):     0.69 cm AV Vmax:           398.67 cm/s AV Vmean:          277.667 cm/s AV VTI:            0.995 m AV Peak Grad:      63.6 mmHg AV Mean Grad:      35.7 mmHg LVOT Vmax:         78.00 cm/s LVOT Vmean:        54.750 cm/s LVOT VTI:          0.218 m LVOT/AV VTI ratio: 0.22  AORTA Ao Root diam: 3.30 cm Ao Asc diam:  2.60 cm MITRAL VALVE                TRICUSPID VALVE MV Area (PHT): 6.07 cm     TR Peak grad:   31.6 mmHg MV Area VTI:   3.04 cm     TR Vmax:        281.00 cm/s MV Peak grad:  6.5 mmHg MV Mean grad:  4.0 mmHg     SHUNTS MV Vmax:       1.27 m/s     Systemic VTI:  0.22 m MV Vmean:      90.4 cm/s    Systemic Diam: 2.00 cm MV Decel Time: 125 msec MV E velocity: 118.00 cm/s Rozann Lesches MD Electronically signed by Rozann Lesches MD  Signature Date/Time: 08/01/2021/4:01:09 PM    Final     Assessment & Plan    1.  Newly documented persistent atrial fibrillation with RVR, spontaneously converted to sinus rhythm.  CHA2DS2-VASc score is at least 5 but she is not a good candidate for anticoagulation given history of previous GI bleed, cirrhosis with esophageal varices.  She has been placed on low-dose Lopressor (taken off nadolol).  2.  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  3.  Severe calcific aortic stenosis.  Patient is not a good candidate for TAVR evaluation given known malignancy and with palliative care services at baseline.  4.  History of breast cancer, patient has palliative care services at home through [facility], no further palliative chemotherapy is planned.  Continue low-dose aspirin and Lopressor 12.5 mg twice daily.  No further cardiac testing is planned at this point, echocardiogram reviewed above.  Anticipate conservative management of aortic stenosis and PAF as discussed.  Would continue to follow with PCP and hospice services.  We will sign  off.  Signed, Rozann Lesches, MD  08/02/2021, 8:59 AM

## 2021-08-02 NOTE — Discharge Summary (Signed)
Physician Discharge Summary  [name] LOV:[id number] DOB: 1930-04-21 DOA: 08/01/2021  PCP: [name], MD  Admit date: 08/01/2021 Discharge date: 08/02/2021  Admitted From: Home Disposition: Home  Recommendations for Outpatient Follow-up:  Follow up with PCP in 1-2 weeks Home nadolol discontinued in favor of metoprolol tartrate 12.5 mg p.o. twice daily for A. fib with RVR per cardiology Please obtain BMP in one week to recheck renal function  Home Health: Continue home hospice on discharge Equipment/Devices: None  Discharge Condition: Stable CODE STATUS: Full code; hospice to address on discharge Diet recommendation: Regular diet  History of present illness:  [name] is a 87 year old female with past medical history significant for breast cancer, cirrhosis, GERD, hyperlipidemia, hypertension, history of thyroid nodule, history of GI bleed secondary to gastric varices, severe aortic stenosis who presented to [facility] on 10/5 with fever.  Husband at bedside assisted with history.  Recently had a wellness exam with her PCP which she received her flu shot.  Patient started running a fever of 1-1.5 at home according to her husband.  She did not take any Tylenol or ibuprofen.  Also noted that on her blood pressure machine that her heart rate was up to 130.  Patient denied chest pain, no nausea/vomiting, no diaphoresis.  In the ED, temperature 102.0 F.  HR 94-1 26, RR 16-21, BP 105/67.  Sodium 134, potassium 3.9, chloride 106, CO2 20, glucose 134, BUN 23, creatinine 1.21, magnesium 1.8, alkaline phosphatase 176, AST 49, ALT 22, total bilirubin 1.4.  Lactic acid 2.1.  WBC 7.8, hemoglobin 12.0, platelets 185.  TSH 1.633.  COVID-19 PCR negative.  Influenza A/B PCR negative.  Urinalysis unrevealing. EKG with atrial fibrillation with RVR, rate 126.  Cardiology was consulted.  Started on 1 L IV fluid bolus and Cardizem drip.  Duration consulted for further evaluation and management of  febrile illness likely related to recent vaccination for influenza and atrial fibrillation with RVR.  Hospital course:  Paroxysmal atrial fibrillation with RVR, new onset In the ED, patient was noted to have an elevated heart rate, EKG notable for A. fib with RVR.  No previous history.  Likely from febrile illness versus underlying aortic stenosis.  Was given IV fluid bolus, started on Cardizem drip.  Nadolol discontinued and started on low-dose metoprolol tartrate 12.5 mg p.o. twice daily.  Seen by cardiology, not a good candidate for anticoagulation given her history of previous GI bleed, cirrhosis with varices and on hospice care at home.  Now converted back to normal sinus rhythm on telemetry with appropriate rate.  Per cardiology, discontinue nadolol, continue metoprolol tartrate 12.5 mg p.o. twice daily.  Low-dose aspirin.  Outpatient follow-up with PCP.  Febrile illness: Resolved Patient presenting to ED with fever 102.0.  Etiology likely viral illness versus side effect from influenza vaccine.  No leukocytosis, urinalysis unremarkable.  Chest x-ray with no acute cardiopulmonary disease process.  Patient received Tylenol with resolution of fever.  No further fever for 24 hours.  No indication for antibiotics for an acute infectious process at this time.  Recommend Tylenol as needed.  Severe aortic stenosis Read on TTE.  Seen by cardiology, given known malignancy on hospice outpatient, not a good candidate for TAVR. Continue medical management.  Hepatic cirrhosis Hx upper GI bleed from gastric varices Was on nadolol 20 mg p.o. daily and PPI outpatient.  Nadolol was discontinued in favor of metoprolol tartrate as above.  Outpatient follow-up PCP/GI.  GERD: Continue PPI  Essential  hypertension:  Nadolol discontinued in favor of metoprolol tartrate 12.5 mg p.o. twice daily.  Metastatic breast cancer:  Continue follow-up with outpatient oncology, Dr. [name].  Home hospice  Discharge  Diagnoses:  Principal Problem:   New onset atrial fibrillation (HCC) Active Problems:   AKI (acute kidney injury) ([place])   Aortic valvar stenosis   Hepatic cirrhosis (HCC)   A-fib [facility])    Discharge Instructions  Discharge Instructions     Call MD for:  difficulty breathing, headache or visual disturbances   Complete by: As directed    Call MD for:  extreme fatigue   Complete by: As directed    Call MD for:  persistant dizziness or light-headedness   Complete by: As directed    Call MD for:  persistant nausea and vomiting   Complete by: As directed    Call MD for:  severe uncontrolled pain   Complete by: As directed    Call MD for:  temperature >100.4   Complete by: As directed    Diet - low sodium heart healthy   Complete by: As directed    Increase activity slowly   Complete by: As directed       Allergies as of 08/02/2021       Reactions   Penicillins Rash   Has patient had a PCN reaction causing immediate rash, facial/tongue/throat swelling, SOB or lightheadedness with hypotension: No Has patient had a PCN reaction causing severe rash involving mucus membranes or skin necrosis: No Has patient had a PCN reaction that required hospitalization: No Has patient had a PCN reaction occurring within the last 10 years: No If all of the above answers are "NO", then may proceed with Cephalosporin use.        Medication List     STOP taking these medications    nadolol 20 MG tablet Commonly known as: CORGARD       TAKE these medications    aspirin EC 81 MG tablet Take 1 tablet (81 mg total) by mouth daily. Swallow whole.   ESSIAC TONIC PO Take by mouth daily.   hydrocortisone 1 % ointment Apply 1 application topically 2 (two) times daily. What changed:  when to take this reasons to take this   lactulose 10 GM/15ML solution Commonly known as: CHRONULAC Take 30 mLs (20 g total) by mouth 2 (two) times daily. What changed:  when to take this reasons to  take this   metoprolol tartrate 25 MG tablet Commonly known as: LOPRESSOR Take 0.5 tablets (12.5 mg total) by mouth 2 (two) times daily.   multivitamin with minerals Tabs tablet Take 1 tablet by mouth daily.   OneTouch Ultra test strip Generic drug: glucose blood   OVER THE COUNTER MEDICATION Fanbendosol once daily   pantoprazole 40 MG tablet Commonly known as: PROTONIX Take 1 tablet (40 mg total) by mouth 2 (two) times daily before a meal. What changed: when to take this        Follow-up Information     [name], MD. Schedule an appointment as soon as possible for a visit in 1 week(s).   Specialty: Internal Medicine Contact information: [place] [address] [phone]                Allergies  Allergen Reactions   Penicillins Rash    Has patient had a PCN reaction causing immediate rash, facial/tongue/throat swelling, SOB or lightheadedness with hypotension: No Has patient had a PCN reaction causing  severe rash involving mucus membranes or skin necrosis: No Has patient had a PCN reaction that required hospitalization: No Has patient had a PCN reaction occurring within the last 10 years: No If all of the above answers are "NO", then may proceed with Cephalosporin use.     Consultations: Cardiology, Dr. Domenic Polite   Procedures/Studies: DG Chest 2 View  Result Date: 08/01/2021 CLINICAL DATA:  Fever and tachycardia. EXAM: CHEST - 2 VIEW COMPARISON:  Mar 21, 2021 FINDINGS: There is stable right-sided venous Port-A-Cath positioning. There is no evidence of acute infiltrate, pleural effusion or pneumothorax. The heart size and mediastinal contours are within normal limits. Mild to moderate severity calcification of the aortic arch is seen. The visualized skeletal structures are unremarkable. IMPRESSION: No active cardiopulmonary disease. Electronically Signed   By: Virgina Norfolk M.D.   On: 08/01/2021 02:10   ECHOCARDIOGRAM  COMPLETE  Result Date: 08/01/2021    ECHOCARDIOGRAM REPORT   Patient Name:   DATHA KISSINGER Hojnacki Date of Exam: 08/01/2021 Medical Rec #:  [id number]    Height:       64.0 in Accession #:    [id number]   Weight:       176.4 lb Date of Birth:  Mar 08, 1930    BSA:          1.855 m Patient Age:    19 years     BP:           114/83 mmHg Patient Gender: F            HR:           104 bpm. Exam Location:  [facility] Procedure: 2D Echo, Cardiac Doppler and Color Doppler Indications:    Atrial Fibrillation  History:        Patient has prior history of Echocardiogram examinations, most                 recent 04/03/2020. Arrythmias:Atrial Fibrillation,                 Signs/Symptoms:Syncope; Risk Factors:Dyslipidemia. Breast CA.  Sonographer:    Wenda Low Referring Phys: [id number] ASIA B [place]  1. Left ventricular ejection fraction, by estimation, is 55 to 60%. The left ventricle has normal function. The left ventricle has no regional wall motion abnormalities. There is moderate asymmetric left ventricular hypertrophy of the septal segment. Left ventricular diastolic parameters are indeterminate.  2. Right ventricular systolic function is normal. The right ventricular size is normal. There is moderately elevated pulmonary artery systolic pressure. The estimated right ventricular systolic pressure is 67.5 mmHg.  3. Left atrial size was mildly dilated.  4. The mitral valve is abnormal. Trivial mitral valve regurgitation. The mean mitral valve gradient is 4.0 mmHg. Moderate mitral annular calcification.  5. Tricuspid valve regurgitation is moderate.  6. The aortic valve is tricuspid. There is severe calcifcation of the aortic valve. Severe aortic valve stenosis. Aortic valve mean gradient measures 35.7 mmHg. Dimentionless index 0.22.  7. The inferior vena cava is dilated in size with <50% respiratory variability, suggesting right atrial pressure of 15 mmHg. Comparison(s): Prior images reviewed side by side. Aortic  stenosis severe as before, although mean gradient measures somewhat less at 36 mmHg. FINDINGS  Left Ventricle: Left ventricular ejection fraction, by estimation, is 55 to 60%. The left ventricle has normal function. The left ventricle has no regional wall motion abnormalities. The left ventricular internal cavity size was normal in size. There is  moderate asymmetric left ventricular  hypertrophy of the septal segment. Left ventricular diastolic parameters are indeterminate. Right Ventricle: The right ventricular size is normal. No increase in right ventricular wall thickness. Right ventricular systolic function is normal. There is moderately elevated pulmonary artery systolic pressure. The tricuspid regurgitant velocity is 2.81 m/s, and with an assumed right atrial pressure of 15 mmHg, the estimated right ventricular systolic pressure is 74.0 mmHg. Left Atrium: Left atrial size was mildly dilated. Right Atrium: Right atrial size was normal in size. Pericardium: There is no evidence of pericardial effusion. Presence of pericardial fat pad. Mitral Valve: The mitral valve is abnormal. There is mild thickening of the mitral valve leaflet(s). There is mild calcification of the mitral valve leaflet(s). Moderate mitral annular calcification. Trivial mitral valve regurgitation. MV peak gradient, 6.5 mmHg. The mean mitral valve gradient is 4.0 mmHg. Tricuspid Valve: The tricuspid valve is grossly normal. Tricuspid valve regurgitation is moderate. Aortic Valve: The aortic valve is tricuspid. There is severe calcifcation of the aortic valve. There is mild aortic valve annular calcification. Aortic valve regurgitation is not visualized. Severe aortic stenosis is present. Aortic valve mean gradient measures 35.7 mmHg. Aortic valve peak gradient measures 63.6 mmHg. Aortic valve area, by VTI measures 0.69 cm. Pulmonic Valve: The pulmonic valve was grossly normal. Pulmonic valve regurgitation is trivial. Aorta: The aortic root is  normal in size and structure. Venous: The inferior vena cava is dilated in size with less than 50% respiratory variability, suggesting right atrial pressure of 15 mmHg. IAS/Shunts: No atrial level shunt detected by color flow Doppler.  LEFT VENTRICLE PLAX 2D LVIDd:         3.80 cm  Diastology LVIDs:         2.70 cm  LV e' medial:    5.77 cm/s LV PW:         1.40 cm  LV E/e' medial:  20.5 LV IVS:        1.50 cm  LV e' lateral:   6.09 cm/s LVOT diam:     2.00 cm  LV E/e' lateral: 19.4 LV SV:         68 LV SV Index:   37 LVOT Area:     3.14 cm  RIGHT VENTRICLE RV Basal diam:  3.65 cm RV Mid diam:    2.50 cm RV S prime:     8.59 cm/s TAPSE (M-mode): 2.6 cm LEFT ATRIUM             Index       RIGHT ATRIUM           Index LA diam:        3.50 cm 1.89 cm/m  RA Area:     18.70 cm LA Vol (A2C):   61.6 ml 33.22 ml/m RA Volume:   52.20 ml  28.15 ml/m LA Vol (A4C):   61.9 ml 33.38 ml/m LA Biplane Vol: 61.1 ml 32.95 ml/m  AORTIC VALVE                    PULMONIC VALVE AV Area (Vmax):    0.61 cm     PV Vmax:       0.69 m/s AV Area (Vmean):   0.62 cm     PV Peak grad:  1.9 mmHg AV Area (VTI):     0.69 cm AV Vmax:           398.67 cm/s AV Vmean:          277.667 cm/s AV VTI:  0.995 m AV Peak Grad:      63.6 mmHg AV Mean Grad:      35.7 mmHg LVOT Vmax:         78.00 cm/s LVOT Vmean:        54.750 cm/s LVOT VTI:          0.218 m LVOT/AV VTI ratio: 0.22  AORTA Ao Root diam: 3.30 cm Ao Asc diam:  2.60 cm MITRAL VALVE                TRICUSPID VALVE MV Area (PHT): 6.07 cm     TR Peak grad:   31.6 mmHg MV Area VTI:   3.04 cm     TR Vmax:        281.00 cm/s MV Peak grad:  6.5 mmHg MV Mean grad:  4.0 mmHg     SHUNTS MV Vmax:       1.27 m/s     Systemic VTI:  0.22 m MV Vmean:      90.4 cm/s    Systemic Diam: 2.00 cm MV Decel Time: 125 msec MV E velocity: 118.00 cm/s Rozann Lesches MD Electronically signed by Rozann Lesches MD Signature Date/Time: 08/01/2021/4:01:09 PM    Final      Subjective:   Discharge  Exam: Vitals:   08/01/21 2008 08/02/21 0610  BP: (!) 136/56 (!) 151/69  Pulse: 69 70  Resp: 15 17  Temp: 98.2 F (36.8 C) 98.1 F (36.7 C)  SpO2: 99% 100%   Vitals:   08/01/21 1400 08/01/21 1556 08/01/21 2008 08/02/21 0610  BP: (!) 158/91 134/66 (!) 136/56 (!) 151/69  Pulse: 75 (!) 58 69 70  Resp:  20 15 17   Temp: 97.7 F (36.5 C) 98 F (36.7 C) 98.2 F (36.8 C) 98.1 F (36.7 C)  TempSrc: Oral  Oral Oral  SpO2: 92% 100% 99% 100%  Weight:      Height:        General: Pt is alert, awake, not in acute distress, elderly in appearance Cardiovascular: RRR, S1/S2 +, 3/6 SEM RUSB, no rubs, no gallops Respiratory: CTA bilaterally, no wheezing, no rhonchi, on room air Abdominal: Soft, NT, ND, bowel sounds + Extremities: no edema, no cyanosis    The results of significant diagnostics from this hospitalization (including imaging, microbiology, ancillary and laboratory) are listed below for reference.     Microbiology: Recent Results (from the past 240 hour(s))  Culture, blood (routine x 2)     Status: None (Preliminary result)   Collection Time: 08/01/21  1:20 AM   Specimen: BLOOD  Result Value Ref Range Status   Specimen Description BLOOD BLOOD RIGHT WRIST  Final   Special Requests   Final    Blood Culture adequate volume BOTTLES DRAWN AEROBIC AND ANAEROBIC   Culture   Final    NO GROWTH 1 DAY Performed at [facility], [address], [place], [place] [address]    Report Status PENDING  Incomplete  Culture, blood (routine x 2)     Status: None (Preliminary result)   Collection Time: 08/01/21  1:20 AM   Specimen: BLOOD RIGHT ARM  Result Value Ref Range Status   Specimen Description BLOOD RIGHT ARM  Final   Special Requests   Final    BOTTLES DRAWN AEROBIC AND ANAEROBIC Blood Culture adequate volume   Culture   Final    NO GROWTH 1 DAY Performed at [facility], [address] [place], [place] [address]    Report Status  PENDING  Incomplete  Resp Panel by RT-PCR (Flu  A&B, Covid) Nasopharyngeal Swab     Status: None   Collection Time: 08/01/21  2:03 AM   Specimen: Nasopharyngeal Swab; Nasopharyngeal(NP) swabs in vial transport medium  Result Value Ref Range Status   SARS Coronavirus 2 by RT PCR NEGATIVE NEGATIVE Final    Comment: (NOTE) SARS-CoV-2 target nucleic acids are NOT DETECTED.  The SARS-CoV-2 RNA is generally detectable in upper respiratory specimens during the acute phase of infection. The lowest concentration of SARS-CoV-2 viral copies this assay can detect is 138 copies/mL. A negative result does not preclude SARS-Cov-2 infection and should not be used as the sole basis for treatment or other patient management decisions. A negative result may occur with  improper specimen collection/handling, submission of specimen other than nasopharyngeal swab, presence of viral mutation(s) within the areas targeted by this assay, and inadequate number of viral copies(<138 copies/mL). A negative result must be combined with clinical observations, patient history, and epidemiological information. The expected result is Negative.  Fact Sheet for Patients:  EntrepreneurPulse.com.au  Fact Sheet for Healthcare Providers:  IncredibleEmployment.be  This test is no t yet approved or cleared by the [place] FDA and  has been authorized for detection and/or diagnosis of SARS-CoV-2 by FDA under an Emergency Use Authorization (EUA). This EUA will remain  in effect (meaning this test can be used) for the duration of the COVID-19 declaration under Section 564(b)(1) of the Act, 21 U.S.C.section 360bbb-3(b)(1), unless the authorization is terminated  or revoked sooner.       Influenza A by PCR NEGATIVE NEGATIVE Final   Influenza B by PCR NEGATIVE NEGATIVE Final    Comment: (NOTE) The Xpert Xpress SARS-CoV-2/FLU/RSV plus assay is intended as an aid in the diagnosis of influenza from Nasopharyngeal swab specimens  and should not be used as a sole basis for treatment. Nasal washings and aspirates are unacceptable for Xpert Xpress SARS-CoV-2/FLU/RSV testing.  Fact Sheet for Patients: EntrepreneurPulse.com.au  Fact Sheet for Healthcare Providers: IncredibleEmployment.be  This test is not yet approved or cleared by the [place] FDA and has been authorized for detection and/or diagnosis of SARS-CoV-2 by FDA under an Emergency Use Authorization (EUA). This EUA will remain in effect (meaning this test can be used) for the duration of the COVID-19 declaration under Section 564(b)(1) of the Act, 21 U.S.C. section 360bbb-3(b)(1), unless the authorization is terminated or revoked.  Performed at [facility], [address], [place], [place] [address]      Labs: BNP (last 3 results) No results for input(s): BNP in the last 8760 hours. Basic Metabolic Panel: Recent Labs  Lab 08/01/21 0120 08/02/21 0513  NA 134* 136  K 3.9 3.8  CL 106 110  CO2 20* 21*  GLUCOSE 134* 91  BUN 23 29*  CREATININE 1.21* 1.46*  CALCIUM 8.9 8.0*  MG 1.8  --    Liver Function Tests: Recent Labs  Lab 08/01/21 0120 08/02/21 0513  AST 49* 38  ALT 22 21  ALKPHOS 176* 132*  BILITOT 1.4* 1.1  PROT 6.9 5.5*  ALBUMIN 3.1* 2.5*   No results for input(s): LIPASE, AMYLASE in the last 168 hours. Recent Labs  Lab 08/01/21 0756  AMMONIA 67*   CBC: Recent Labs  Lab 08/01/21 0120 08/02/21 0513  WBC 7.8 6.9  NEUTROABS 6.2 3.5  HGB 12.0 10.1*  HCT 35.0* 31.0*  MCV 92.8 95.1  PLT 185 152   Cardiac Enzymes: No results for input(s): CKTOTAL, CKMB,  CKMBINDEX, TROPONINI in the last 168 hours. BNP: Invalid input(s): POCBNP CBG: Recent Labs  Lab 08/01/21 0834 08/01/21 1147 08/01/21 1713 08/01/21 2203 08/02/21 0749  GLUCAP 98 76 136* 103* 96   D-Dimer No results for input(s): DDIMER in the last 72 hours. Hgb A1c Recent Labs    08/01/21 0120  HGBA1C 5.7*   Lipid  Profile No results for input(s): CHOL, HDL, LDLCALC, TRIG, CHOLHDL, LDLDIRECT in the last 72 hours. Thyroid function studies Recent Labs    08/01/21 0120  TSH 1.633   Anemia work up No results for input(s): VITAMINB12, FOLATE, FERRITIN, TIBC, IRON, RETICCTPCT in the last 72 hours. Urinalysis    Component Value Date/Time   COLORURINE YELLOW 08/01/2021 0127   APPEARANCEUR HAZY (A) 08/01/2021 0127   LABSPEC 1.013 08/01/2021 0127   PHURINE 6.0 08/01/2021 0127   GLUCOSEU NEGATIVE 08/01/2021 0127   HGBUR NEGATIVE 08/01/2021 0127   BILIRUBINUR NEGATIVE 08/01/2021 0127   KETONESUR NEGATIVE 08/01/2021 0127   PROTEINUR >=300 (A) 08/01/2021 0127   UROBILINOGEN 0.2 12/05/2011 1037   NITRITE NEGATIVE 08/01/2021 0127   LEUKOCYTESUR NEGATIVE 08/01/2021 0127   Sepsis Labs Invalid input(s): PROCALCITONIN,  WBC,  LACTICIDVEN Microbiology Recent Results (from the past 240 hour(s))  Culture, blood (routine x 2)     Status: None (Preliminary result)   Collection Time: 08/01/21  1:20 AM   Specimen: BLOOD  Result Value Ref Range Status   Specimen Description BLOOD BLOOD RIGHT WRIST  Final   Special Requests   Final    Blood Culture adequate volume BOTTLES DRAWN AEROBIC AND ANAEROBIC   Culture   Final    NO GROWTH 1 DAY Performed at [facility], [address] [place], [place] [address]    Report Status PENDING  Incomplete  Culture, blood (routine x 2)     Status: None (Preliminary result)   Collection Time: 08/01/21  1:20 AM   Specimen: BLOOD RIGHT ARM  Result Value Ref Range Status   Specimen Description BLOOD RIGHT ARM  Final   Special Requests   Final    BOTTLES DRAWN AEROBIC AND ANAEROBIC Blood Culture adequate volume   Culture   Final    NO GROWTH 1 DAY Performed at [facility], [address] [place], [place] [address]    Report Status PENDING  Incomplete  Resp Panel by RT-PCR (Flu A&B, Covid) Nasopharyngeal Swab     Status: None   Collection Time: 08/01/21  2:03 AM    Specimen: Nasopharyngeal Swab; Nasopharyngeal(NP) swabs in vial transport medium  Result Value Ref Range Status   SARS Coronavirus 2 by RT PCR NEGATIVE NEGATIVE Final    Comment: (NOTE) SARS-CoV-2 target nucleic acids are NOT DETECTED.  The SARS-CoV-2 RNA is generally detectable in upper respiratory specimens during the acute phase of infection. The lowest concentration of SARS-CoV-2 viral copies this assay can detect is 138 copies/mL. A negative result does not preclude SARS-Cov-2 infection and should not be used as the sole basis for treatment or other patient management decisions. A negative result may occur with  improper specimen collection/handling, submission of specimen other than nasopharyngeal swab, presence of viral mutation(s) within the areas targeted by this assay, and inadequate number of viral copies(<138 copies/mL). A negative result must be combined with clinical observations, patient history, and epidemiological information. The expected result is Negative.  Fact Sheet for Patients:  EntrepreneurPulse.com.au  Fact Sheet for Healthcare Providers:  IncredibleEmployment.be  This test is no t yet approved or cleared by the [place]  States FDA and  has been authorized for detection and/or diagnosis of SARS-CoV-2 by FDA under an Emergency Use Authorization (EUA). This EUA will remain  in effect (meaning this test can be used) for the duration of the COVID-19 declaration under Section 564(b)(1) of the Act, 21 U.S.C.section 360bbb-3(b)(1), unless the authorization is terminated  or revoked sooner.       Influenza A by PCR NEGATIVE NEGATIVE Final   Influenza B by PCR NEGATIVE NEGATIVE Final    Comment: (NOTE) The Xpert Xpress SARS-CoV-2/FLU/RSV plus assay is intended as an aid in the diagnosis of influenza from Nasopharyngeal swab specimens and should not be used as a sole basis for treatment. Nasal washings and aspirates are  unacceptable for Xpert Xpress SARS-CoV-2/FLU/RSV testing.  Fact Sheet for Patients: EntrepreneurPulse.com.au  Fact Sheet for Healthcare Providers: IncredibleEmployment.be  This test is not yet approved or cleared by the [place] FDA and has been authorized for detection and/or diagnosis of SARS-CoV-2 by FDA under an Emergency Use Authorization (EUA). This EUA will remain in effect (meaning this test can be used) for the duration of the COVID-19 declaration under Section 564(b)(1) of the Act, 21 U.S.C. section 360bbb-3(b)(1), unless the authorization is terminated or revoked.  Performed at [facility], [address] [place], [place] [address]      Time coordinating discharge: Over 30 minutes  SIGNED:   Shyleigh Daughtry J [place] DO  [facility] 08/02/2021, 9:59 AM

## 2021-08-06 LAB — CULTURE, BLOOD (ROUTINE X 2)
Culture: NO GROWTH
Culture: NO GROWTH
Special Requests: ADEQUATE
Special Requests: ADEQUATE

## 2021-09-04 ENCOUNTER — Encounter (HOSPITAL_COMMUNITY): Payer: Self-pay

## 2021-09-04 NOTE — Progress Notes (Signed)
Call received from patient's husband requesting confirmation of checking ammonia level. Ammonia level has not been ordered by Dr. [name]. Attempted to reach husband twice, but unable to reach him and no VM available.

## 2021-09-05 ENCOUNTER — Other Ambulatory Visit: Payer: Self-pay

## 2021-09-05 ENCOUNTER — Inpatient Hospital Stay (HOSPITAL_COMMUNITY): Payer: Medicare HMO | Attending: Hematology

## 2021-09-05 ENCOUNTER — Ambulatory Visit (HOSPITAL_COMMUNITY)
Admission: RE | Admit: 2021-09-05 | Discharge: 2021-09-05 | Disposition: A | Discharge status: Home / Self Care | Payer: Medicare HMO | Source: Ambulatory Visit | Attending: Hematology | Admitting: Hematology

## 2021-09-05 ENCOUNTER — Telehealth: Payer: Self-pay | Admitting: Internal Medicine

## 2021-09-05 ENCOUNTER — Encounter (HOSPITAL_COMMUNITY): Payer: Self-pay | Admitting: Radiology

## 2021-09-05 DIAGNOSIS — N189 Chronic kidney disease, unspecified: Secondary | ICD-10-CM | POA: Insufficient documentation

## 2021-09-05 DIAGNOSIS — Z79899 Other long term (current) drug therapy: Secondary | ICD-10-CM | POA: Insufficient documentation

## 2021-09-05 DIAGNOSIS — K766 Portal hypertension: Secondary | ICD-10-CM | POA: Diagnosis not present

## 2021-09-05 DIAGNOSIS — R7401 Elevation of levels of liver transaminase levels: Secondary | ICD-10-CM | POA: Insufficient documentation

## 2021-09-05 DIAGNOSIS — C50411 Malignant neoplasm of upper-outer quadrant of right female breast: Secondary | ICD-10-CM | POA: Diagnosis not present

## 2021-09-05 DIAGNOSIS — I129 Hypertensive chronic kidney disease with stage 1 through stage 4 chronic kidney disease, or unspecified chronic kidney disease: Secondary | ICD-10-CM | POA: Insufficient documentation

## 2021-09-05 DIAGNOSIS — N6310 Unspecified lump in the right breast, unspecified quadrant: Secondary | ICD-10-CM | POA: Diagnosis not present

## 2021-09-05 DIAGNOSIS — K862 Cyst of pancreas: Secondary | ICD-10-CM | POA: Diagnosis not present

## 2021-09-05 DIAGNOSIS — I2510 Atherosclerotic heart disease of native coronary artery without angina pectoris: Secondary | ICD-10-CM | POA: Diagnosis not present

## 2021-09-05 DIAGNOSIS — K7460 Unspecified cirrhosis of liver: Secondary | ICD-10-CM | POA: Diagnosis not present

## 2021-09-05 DIAGNOSIS — R188 Other ascites: Secondary | ICD-10-CM | POA: Insufficient documentation

## 2021-09-05 DIAGNOSIS — Z171 Estrogen receptor negative status [ER-]: Secondary | ICD-10-CM | POA: Diagnosis not present

## 2021-09-05 DIAGNOSIS — K449 Diaphragmatic hernia without obstruction or gangrene: Secondary | ICD-10-CM | POA: Diagnosis not present

## 2021-09-05 DIAGNOSIS — Z7982 Long term (current) use of aspirin: Secondary | ICD-10-CM | POA: Insufficient documentation

## 2021-09-05 DIAGNOSIS — C773 Secondary and unspecified malignant neoplasm of axilla and upper limb lymph nodes: Secondary | ICD-10-CM | POA: Insufficient documentation

## 2021-09-05 LAB — CBC WITH DIFFERENTIAL/PLATELET
Abs Immature Granulocytes: 0.01 10*3/uL (ref 0.00–0.07)
Basophils Absolute: 0.1 10*3/uL (ref 0.0–0.1)
Basophils Relative: 1 %
Eosinophils Absolute: 0.5 10*3/uL (ref 0.0–0.5)
Eosinophils Relative: 8 %
HCT: 35.3 % — ABNORMAL LOW (ref 36.0–46.0)
Hemoglobin: 12.3 g/dL (ref 12.0–15.0)
Immature Granulocytes: 0 %
Lymphocytes Relative: 35 %
Lymphs Abs: 2.4 10*3/uL (ref 0.7–4.0)
MCH: 31.5 pg (ref 26.0–34.0)
MCHC: 34.8 g/dL (ref 30.0–36.0)
MCV: 90.5 fL (ref 80.0–100.0)
Monocytes Absolute: 1 10*3/uL (ref 0.1–1.0)
Monocytes Relative: 15 %
Neutro Abs: 2.8 10*3/uL (ref 1.7–7.7)
Neutrophils Relative %: 41 %
Platelets: 211 10*3/uL (ref 150–400)
RBC: 3.9 MIL/uL (ref 3.87–5.11)
RDW: 15.4 % (ref 11.5–15.5)
WBC: 6.8 10*3/uL (ref 4.0–10.5)
nRBC: 0 % (ref 0.0–0.2)

## 2021-09-05 LAB — COMPREHENSIVE METABOLIC PANEL
ALT: 21 U/L (ref 0–44)
AST: 46 U/L — ABNORMAL HIGH (ref 15–41)
Albumin: 3 g/dL — ABNORMAL LOW (ref 3.5–5.0)
Alkaline Phosphatase: 166 U/L — ABNORMAL HIGH (ref 38–126)
Anion gap: 10 (ref 5–15)
BUN: 32 mg/dL — ABNORMAL HIGH (ref 8–23)
CO2: 23 mmol/L (ref 22–32)
Calcium: 9 mg/dL (ref 8.9–10.3)
Chloride: 100 mmol/L (ref 98–111)
Creatinine, Ser: 1.46 mg/dL — ABNORMAL HIGH (ref 0.44–1.00)
GFR, Estimated: 34 mL/min — ABNORMAL LOW (ref >60)
Glucose, Bld: 99 mg/dL (ref 70–99)
Potassium: 3.6 mmol/L (ref 3.5–5.1)
Sodium: 133 mmol/L — ABNORMAL LOW (ref 135–145)
Total Bilirubin: 1.3 mg/dL — ABNORMAL HIGH (ref 0.3–1.2)
Total Protein: 7 g/dL (ref 6.5–8.1)

## 2021-09-05 LAB — MAGNESIUM: Magnesium: 2 mg/dL (ref 1.7–2.4)

## 2021-09-05 MED ORDER — IOHEXOL 300 MG/ML  SOLN
100.0000 mL | Freq: Once | INTRAMUSCULAR | Status: AC | PRN
  Administered 2021-09-05: 80 mL via INTRAVENOUS

## 2021-09-05 NOTE — Telephone Encounter (Signed)
Spoke with pt's husband and explained to him that pt needs to have labs done at [facility] that Neil Crouch ordered on 07/04/2021. Husband verbalized understanding.

## 2021-09-06 LAB — CANCER ANTIGEN 15-3: CA 15-3: 15.4 U/mL (ref 0.0–25.0)

## 2021-09-06 LAB — CANCER ANTIGEN 27.29: CA 27.29: 19.2 U/mL (ref 0.0–38.6)

## 2021-09-10 NOTE — Progress Notes (Signed)
[facility] [address] [place] [address]   Patient Care Team: [name], MD as PCP - General (Internal Medicine) [name], MD as Consulting Physician (Dermatology) Gala Romney Cristopher Estimable, MD as Consulting Physician (Gastroenterology)  SUMMARY OF ONCOLOGIC HISTORY: Oncology History  Malignant neoplasm of upper-outer quadrant of right breast in female, estrogen receptor negative ([place])  07/03/2018 Initial Diagnosis   Palpable right breast mass for several months with skin discoloration, clinically skin trabecular thickening with nipple retraction measuring 7.2 x 4.3 x 4.2 cm, additional mass 4 o'clock position 2.6 cm, right axillary tail 1.2 cm right axillary lymph node 1.8 cm left breast irregular mass 1 cm, adjacent mass 0.9 cm together measuring 1.9 cm, no lymphadenopathy   07/03/2018 Pathology Results   Right breast biopsy: IDC grade 2, ER 0%, PR 0%, HER-2 positive, Ki-67 40%, lymph node biopsy negative, left breast biopsy complex sclerosing lesion    07/15/2018 Cancer Staging   Staging form: Breast, AJCC 8th Edition - Clinical: Stage IIB (cT3, cN0, cM0, G2, ER-, PR-, HER2+) - Signed by [name], MD on 07/15/2018    07/31/2018 - 10/30/2018 Chemotherapy   The patient had trastuzumab (HERCEPTIN) 336 mg in sodium chloride 0.9 % 250 mL chemo infusion, 4 mg/kg = 336 mg, Intravenous,  Once, 4 of 4 cycles Administration: 336 mg (07/31/2018), 168 mg (08/07/2018), 168 mg (08/28/2018), 168 mg (08/14/2018), 168 mg (08/21/2018), 168 mg (09/04/2018), 168 mg (09/11/2018), 168 mg (09/18/2018), 168 mg (10/02/2018), 168 mg (10/09/2018), 168 mg (10/16/2018), 168 mg (10/23/2018), 168 mg (10/30/2018) PACLitaxel (TAXOL) 78 mg in sodium chloride 0.9 % 250 mL chemo infusion (</= 53m/m2), 40 mg/m2 = 78 mg (50 % of original dose 80 mg/m2), Intravenous,  Once, 4 of 4 cycles Dose modification: 40 mg/m2 (50 % of original dose 80 mg/m2, Cycle 1, Reason: Patient Age), 53.3333 mg/m2 (66.7 % of  original dose 80 mg/m2, Cycle 1, Reason: Provider Judgment), 45 mg/m2 (original dose 80 mg/m2, Cycle 3, Reason: Provider Judgment) Administration: 78 mg (07/31/2018), 102 mg (08/07/2018), 102 mg (08/28/2018), 102 mg (08/14/2018), 102 mg (08/21/2018), 102 mg (09/04/2018), 102 mg (09/18/2018), 84 mg (10/02/2018), 84 mg (10/09/2018), 84 mg (10/16/2018), 84 mg (10/23/2018), 84 mg (10/30/2018)   for chemotherapy treatment.     11/13/2018 - 01/25/2019 Chemotherapy   The patient had trastuzumab (HERCEPTIN) 504 mg in sodium chloride 0.9 % 250 mL chemo infusion, 6 mg/kg = 504 mg (100 % of original dose 6 mg/kg), Intravenous,  Once, 4 of 5 cycles Dose modification: 6 mg/kg (original dose 6 mg/kg, Cycle 1, Reason: Other (see comments), Comment: pt receving it ) Administration: 504 mg (11/13/2018), 504 mg (12/04/2018), 504 mg (01/04/2019), 504 mg (01/25/2019)   for chemotherapy treatment.     02/15/2019 - 04/06/2020 Chemotherapy   The patient had ado-trastuzumab emtansine (KADCYLA) 240 mg in sodium chloride 0.9 % 250 mL chemo infusion, 3 mg/kg = 240 mg (100 % of original dose 3 mg/kg), Intravenous, Once, 17 of 18 cycles Dose modification: 3 mg/kg (original dose 3 mg/kg, Cycle 1, Reason: Provider Judgment), 3 mg/kg (original dose 3 mg/kg, Cycle 2, Reason: Provider Judgment), 2.4 mg/kg (original dose 3 mg/kg, Cycle 11, Reason: Other (see comments), Comment: bili upper limit of normal), 2.4 mg/kg (original dose 3 mg/kg, Cycle 12, Reason: Other (see comments), Comment: elevated bilirubin) Administration: 240 mg (02/15/2019), 240 mg (03/08/2019), 240 mg (05/10/2019), 240 mg (03/29/2019), 240 mg (04/19/2019), 240 mg (06/01/2019), 240 mg (06/22/2019), 240 mg (07/20/2019), 240 mg (08/10/2019), 240  mg (09/01/2019), 200 mg (09/22/2019), 180 mg (11/03/2019), 180 mg (12/09/2019), 180 mg (01/06/2020), 180 mg (02/03/2020), 180 mg (03/09/2020), 180 mg (04/06/2020)   for chemotherapy treatment.     05/15/2020 -  Chemotherapy   The patient had dexamethasone  (DECADRON) 4 MG tablet, 8 mg, Oral, Daily, 1 of 1 cycle, Start date: --, End date: -- palonosetron (ALOXI) injection 0.25 mg, 0.25 mg, Intravenous,  Once, 7 of 9 cycles Administration: 0.25 mg (05/15/2020), 0.25 mg (06/05/2020), 0.25 mg (08/24/2020), 0.25 mg (09/14/2020), 0.25 mg (06/26/2020), 0.25 mg (07/17/2020), 0.25 mg (10/05/2020) fam-trastuzumab deruxtecan-nxki (ENHERTU) 226 mg in dextrose 5 % 100 mL chemo infusion, 3.2 mg/kg = 226 mg (59.3 % of original dose 5.4 mg/kg), Intravenous,  Once, 7 of 9 cycles Dose modification: 3.2 mg/kg (original dose 5.4 mg/kg, Cycle 1, Reason: Provider Judgment), 4.4 mg/kg (original dose 5.4 mg/kg, Cycle 2, Reason: Provider Judgment), 4.4 mg/kg (original dose 5.4 mg/kg, Cycle 5, Reason: Provider Judgment), 3.2 mg/kg (original dose 5.4 mg/kg, Cycle 5, Reason: Provider Judgment), 3.2 mg/kg (original dose 5.4 mg/kg, Cycle 6, Reason: Provider Judgment), 4.4 mg/kg (original dose 5.4 mg/kg, Cycle 3, Reason: Provider Judgment), 200 mg (original dose 5.4 mg/kg, Cycle 5, Reason: Other (see comments), Comment: all pharmacy has on hand) Administration: 226 mg (05/15/2020), 312 mg (06/05/2020), 226 mg (09/14/2020), 312 mg (06/26/2020), 312 mg (07/17/2020), 200 mg (08/24/2020), 226 mg (10/05/2020)   for chemotherapy treatment.       CHIEF COMPLIANT: Follow-up for right breast cancer   INTERVAL HISTORY: [name] is a 87 y.o. female here today for follow up of her right breast cancer. Her last visit was on 02/07/2021.   Today she reports feeling good. Her appetite is good. She is able to walk with the assistance of a walker. She denies any recent falls. She denies any pain in her breasts.   REVIEW OF SYSTEMS:   Review of Systems  Constitutional:  Negative for appetite change and fatigue.  Cardiovascular:  Negative for chest pain.  All other systems reviewed and are negative.  I have reviewed the past medical history, past surgical history, social history and family history  with the patient and they are unchanged from previous note.   ALLERGIES:   is allergic to penicillins.   MEDICATIONS:  Current Outpatient Medications  Medication Sig Dispense Refill   aspirin EC 81 MG tablet Take 1 tablet (81 mg total) by mouth daily. Swallow whole. 30 tablet 11   hydrocortisone 1 % ointment Apply 1 application topically 2 (two) times daily. (Patient taking differently: Apply 1 application topically as needed.) 60 g 0   lactulose (CHRONULAC) 10 GM/15ML solution Take 30 mLs (20 g total) by mouth 2 (two) times daily. (Patient taking differently: Take 20 g by mouth as needed.) 236 mL 0   metoprolol tartrate (LOPRESSOR) 25 MG tablet Take 0.5 tablets (12.5 mg total) by mouth 2 (two) times daily. 30 tablet 2   Misc Natural Products (ESSIAC TONIC PO) Take by mouth daily.     Multiple Vitamin (MULTIVITAMIN WITH MINERALS) TABS tablet Take 1 tablet by mouth daily.     ONETOUCH ULTRA test strip      OVER THE COUNTER MEDICATION Fanbendosol once daily     pantoprazole (PROTONIX) 40 MG tablet Take 1 tablet (40 mg total) by mouth 2 (two) times daily before a meal. (Patient taking differently: Take 40 mg by mouth daily.) 60 tablet 11   No current facility-administered medications for this visit.   Facility-Administered Medications  Ordered in Other Visits  Medication Dose Route Frequency Provider Last Rate Last Admin   sodium chloride flush (NS) 0.9 % injection 10 mL  10 mL Intravenous PRN Derek Jack, MD   10 mL at 05/23/20 1025   sodium chloride flush (NS) 0.9 % injection 10 mL  10 mL Intravenous PRN Derek Jack, MD   10 mL at 05/23/20 0900     PHYSICAL EXAMINATION: Performance status (ECOG): 1 - Symptomatic but completely ambulatory  There were no vitals filed for this visit. Wt Readings from Last 3 Encounters:  08/01/21 176 lb 5.9 oz (80 kg)  07/04/21 176 lb 6.4 oz (80 kg)  04/18/21 169 lb 8.5 oz (76.9 kg)   Physical Exam Vitals reviewed.  Constitutional:       Appearance: Normal appearance.     Comments: In wheelchair  Cardiovascular:     Rate and Rhythm: Normal rate and regular rhythm.     Pulses: Normal pulses.     Heart sounds: Normal heart sounds.  Pulmonary:     Effort: Pulmonary effort is normal.     Breath sounds: Normal breath sounds.  Chest:  Breasts:    Right: Inverted nipple and mass (5 cm upper quadrant above areola) present. No tenderness.     Left: No tenderness.  Lymphadenopathy:     Cervical: No cervical adenopathy.     Right cervical: No superficial cervical adenopathy.    Left cervical: No superficial cervical adenopathy.     Upper Body:     Right upper body: No supraclavicular, axillary or pectoral adenopathy.     Left upper body: No supraclavicular, axillary or pectoral adenopathy.  Neurological:     General: No focal deficit present.     Mental Status: She is alert and oriented to person, place, and time.  Psychiatric:        Mood and Affect: Mood normal.        Behavior: Behavior normal.    Breast Exam Chaperone: Thana Ates     LABORATORY DATA:  I have reviewed the data as listed CMP Latest Ref Rng & Units 09/05/2021 08/02/2021 08/01/2021  Glucose 70 - 99 mg/dL 99 91 134(H)  BUN 8 - 23 mg/dL 32(H) 29(H) 23  Creatinine 0.44 - 1.00 mg/dL 1.46(H) 1.46(H) 1.21(H)  Sodium 135 - 145 mmol/L 133(L) 136 134(L)  Potassium 3.5 - 5.1 mmol/L 3.6 3.8 3.9  Chloride 98 - 111 mmol/L 100 110 106  CO2 22 - 32 mmol/L 23 21(L) 20(L)  Calcium 8.9 - 10.3 mg/dL 9.0 8.0(L) 8.9  Total Protein 6.5 - 8.1 g/dL 7.0 5.5(L) 6.9  Total Bilirubin 0.3 - 1.2 mg/dL 1.3(H) 1.1 1.4(H)  Alkaline Phos 38 - 126 U/L 166(H) 132(H) 176(H)  AST 15 - 41 U/L 46(H) 38 49(H)  ALT 0 - 44 U/L 21 21 22    Lab Results  Component Value Date   CAN153 15.4 09/05/2021   CAN153 13.0 04/18/2021   Lab Results  Component Value Date   WBC 6.8 09/05/2021   HGB 12.3 09/05/2021   HCT 35.3 (L) 09/05/2021   MCV 90.5 09/05/2021   PLT 211 09/05/2021    NEUTROABS 2.8 09/05/2021    ASSESSMENT:  1.  Metastatic right breast cancer to the skin, ER/PR negative, HER-2 positive: -Presentation with skin involvement of the right breast, axillary region and posterior chest wall. -12 cycles of weekly Taxol and Herceptin from 07/31/2018 through 10/30/2018 with PET scan showing progression. -Kadcyla from 02/15/2019 through 04/06/2020 with progression. -CT CAP  on 04/26/2020 shows right breast mass measuring 3.7 x 2.8 (2.7 x 2.3).  CC dimension measures 4.3 cm (3.9 cm).  No new areas of metastatic disease.  No skeletal metastasis. -Enhertu started on 05/15/2020 at 3.4 mg/kg. -CT CAP from 08/03/2020 shows no evidence of lymphadenopathy or metastatic disease in the chest, abdomen or pelvis.  Cirrhotic morphology of the liver with moderate volume ascites throughout the abdomen and pelvis.  Wall thickening of the cecal base.   2.  High risk drug monitoring: -2D echo on 04/03/2020 shows EF 65 to 70%.   3. New onset cirrhosis/ascites: -Admitted to hospital from 10/06/2020 through 10/13/2020 with GI bleed. -EGD showed grade 1 varices. She received 3 units PRBC.   PLAN:  1.  Metastatic right breast cancer to the skin, ER/PR negative, HER-2 positive: - Physical examination today shows about 5 cm mass in the right breast above the areola.  No clearly palpable adenopathy. - Reviewed CT CAP from 09/05/2021 which showed enlarging right breast mass with adenopathy in the chest and pulmonary nodules. - We have discontinued all treatments because of her liver disease and bleeding. - RTC 6 months with repeat labs.  Transition to palliative care/hospice if any worsening was discussed. - We will discontinue the port as she is having discomfort from it.   2.  Elevated LFTs: - AST is 46 and stable.  Bilirubin is 1.3.   3.  CKD: - Creatinine is 1.46 and stable.   4. Normocytic anemia: - Hemoglobin is 12.3 with MCV of 90.   5.  New onset cirrhosis and ascites: - She is very  coherent today.  She is not on lactulose.  Continue follow-up with GI.  Breast Cancer therapy associated bone loss: I have recommended calcium, Vitamin D and weight bearing exercises.  Orders placed this encounter:  No orders of the defined types were placed in this encounter.   The patient has a good understanding of the overall plan. She agrees with it. She will call with any problems that may develop before the next visit here.  Derek Jack, MD [place] [phone]   I, Thana Ates, am acting as a scribe for Dr. Derek Jack.  I, Derek Jack MD, have reviewed the above documentation for accuracy and completeness, and I agree with the above.

## 2021-09-11 ENCOUNTER — Other Ambulatory Visit: Payer: Self-pay

## 2021-09-11 ENCOUNTER — Inpatient Hospital Stay (HOSPITAL_BASED_OUTPATIENT_CLINIC_OR_DEPARTMENT_OTHER): Payer: Medicare HMO | Admitting: Hematology

## 2021-09-11 VITALS — BP 143/73 | HR 96 | Temp 98.5°F | Resp 18 | Wt 178.6 lb

## 2021-09-11 DIAGNOSIS — C50411 Malignant neoplasm of upper-outer quadrant of right female breast: Secondary | ICD-10-CM

## 2021-09-11 DIAGNOSIS — Z171 Estrogen receptor negative status [ER-]: Secondary | ICD-10-CM

## 2021-09-11 DIAGNOSIS — K7460 Unspecified cirrhosis of liver: Secondary | ICD-10-CM | POA: Insufficient documentation

## 2021-09-11 DIAGNOSIS — R188 Other ascites: Secondary | ICD-10-CM | POA: Insufficient documentation

## 2021-09-11 DIAGNOSIS — N189 Chronic kidney disease, unspecified: Secondary | ICD-10-CM | POA: Diagnosis not present

## 2021-09-11 DIAGNOSIS — Z7982 Long term (current) use of aspirin: Secondary | ICD-10-CM | POA: Diagnosis not present

## 2021-09-11 DIAGNOSIS — R7401 Elevation of levels of liver transaminase levels: Secondary | ICD-10-CM | POA: Diagnosis not present

## 2021-09-11 DIAGNOSIS — C773 Secondary and unspecified malignant neoplasm of axilla and upper limb lymph nodes: Secondary | ICD-10-CM | POA: Insufficient documentation

## 2021-09-11 DIAGNOSIS — I129 Hypertensive chronic kidney disease with stage 1 through stage 4 chronic kidney disease, or unspecified chronic kidney disease: Secondary | ICD-10-CM | POA: Insufficient documentation

## 2021-09-11 DIAGNOSIS — Z79899 Other long term (current) drug therapy: Secondary | ICD-10-CM | POA: Insufficient documentation

## 2021-09-11 NOTE — Patient Instructions (Signed)
[place] at [facility] Discharge Instructions  You were seen and examined today by Dr. [name].  He reviewed the results of your scan, which showed your breast cancer has grown and spread slightly.  Return to clinic in 6 months as scheduled.  Call the clinic should you have any questions or concerns prior to that time.  We can always see you sooner.    Thank you for choosing [facility] at [facility] to provide your oncology and hematology care.  To afford each patient quality time with our provider, please arrive at least 15 minutes before your scheduled appointment time.   If you have a lab appointment with the [facility] please come in thru the Main Entrance and check in at the main information desk.  You need to re-schedule your appointment should you arrive 10 or more minutes late.  We strive to give you quality time with our providers, and arriving late affects you and other patients whose appointments are after yours.  Also, if you no show three or more times for appointments you may be dismissed from the clinic at the providers discretion.     Again, thank you for choosing [facility].  Our hope is that these requests will decrease the amount of time that you wait before being seen by our physicians.       _____________________________________________________________  Should you have questions after your visit to [facility], please contact our office at [phone] and follow the prompts.  Our office hours are 8:00 a.m. and 4:30 p.m. Monday - Friday.  Please note that voicemails left after 4:00 p.m. may not be returned until the following business day.  We are closed weekends and major holidays.  You do have access to a nurse 24-7, just call the main number to the clinic [phone] and do not press any options, hold on the line and a nurse will answer the phone.    For prescription refill requests, have  your pharmacy contact our office and allow 72 hours.    Due to Covid, you will need to wear a mask upon entering the hospital. If you do not have a mask, a mask will be given to you at the Main Entrance upon arrival. For doctor visits, patients may have 1 support person age 85 or older with them. For treatment visits, patients can not have anyone with them due to social distancing guidelines and our immunocompromised population.

## 2021-09-12 ENCOUNTER — Ambulatory Visit (HOSPITAL_COMMUNITY): Payer: Medicare HMO | Admitting: Hematology

## 2021-10-17 ENCOUNTER — Other Ambulatory Visit (HOSPITAL_COMMUNITY): Payer: Self-pay | Admitting: Nurse Practitioner

## 2021-10-17 DIAGNOSIS — R0601 Orthopnea: Secondary | ICD-10-CM

## 2021-10-17 DIAGNOSIS — R0689 Other abnormalities of breathing: Secondary | ICD-10-CM

## 2021-10-17 DIAGNOSIS — R0600 Dyspnea, unspecified: Secondary | ICD-10-CM | POA: Diagnosis not present

## 2021-10-17 DIAGNOSIS — R0602 Shortness of breath: Secondary | ICD-10-CM

## 2021-10-31 ENCOUNTER — Encounter (HOSPITAL_COMMUNITY): Payer: Self-pay | Admitting: Hematology

## 2021-10-31 ENCOUNTER — Ambulatory Visit (HOSPITAL_COMMUNITY)
Admission: RE | Admit: 2021-10-31 | Discharge: 2021-10-31 | Disposition: A | Discharge status: Home / Self Care | Payer: Medicare Other | Source: Ambulatory Visit | Attending: Nurse Practitioner | Admitting: Nurse Practitioner

## 2021-10-31 ENCOUNTER — Other Ambulatory Visit: Payer: Self-pay

## 2021-10-31 DIAGNOSIS — R0602 Shortness of breath: Secondary | ICD-10-CM | POA: Insufficient documentation

## 2021-10-31 DIAGNOSIS — R0689 Other abnormalities of breathing: Secondary | ICD-10-CM | POA: Diagnosis present

## 2021-10-31 DIAGNOSIS — R0601 Orthopnea: Secondary | ICD-10-CM | POA: Insufficient documentation

## 2021-12-12 IMAGING — CT CT CHEST-ABD-PELV W/ CM
2 of 5 series · 12 of 36 positions shown, 14 images · IV contrast (Omnipaque or Isovue)
Comparison: Tuesday July, 2020 CT of the abdomen and pelvis, PET
evaluation from Sunday March, 2021.

CLINICAL DATA: Breast cancer surveillance, history of RIGHT-sided
breast cancer diagnosed in 2963. Post systemic therapy.

EXAM:
CT CHEST, ABDOMEN, AND PELVIS WITH CONTRAST
TECHNIQUE: Multidetector CT imaging of the chest, abdomen and pelvis was
performed following the standard protocol during bolus
administration of intravenous contrast.
CONTRAST:  80mL OMNIPAQUE IOHEXOL 300 MG/ML  SOLN

[Series 2: cap with · axial · 0.96mm/px · z∈[+1172,+1667]mm · 9 of 125 slices shown, 11 images]
[im 13/125  mediastinal]
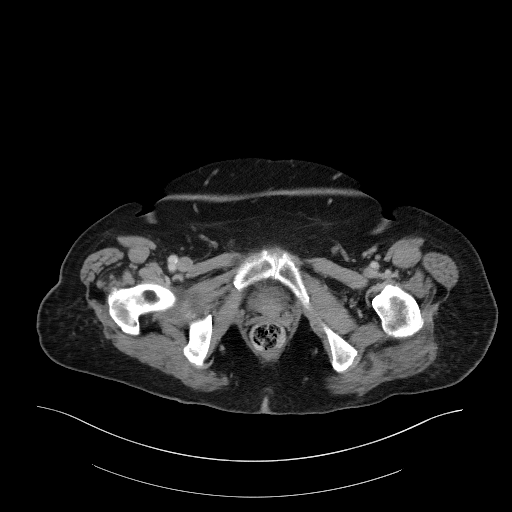
[im 13/125  bone]
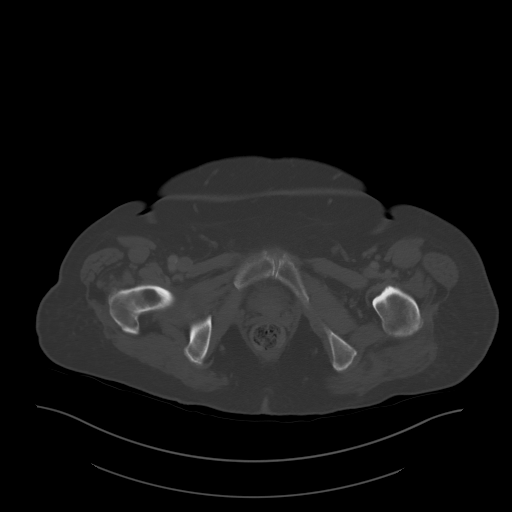
[im 25/125  mediastinal]
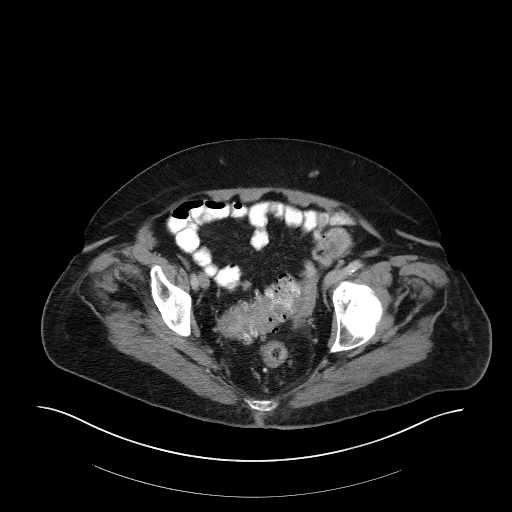
[im 38/125  mediastinal]
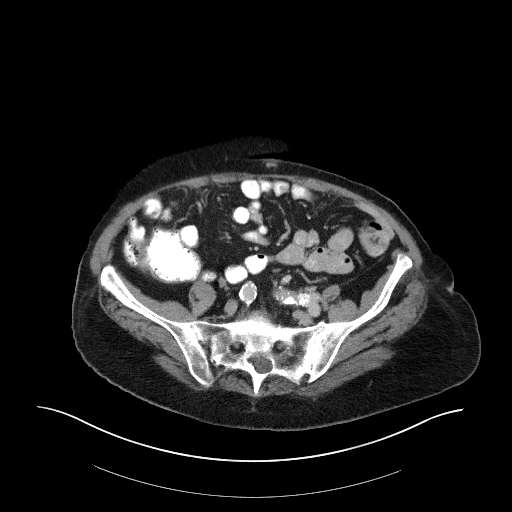
[im 50/125  mediastinal]
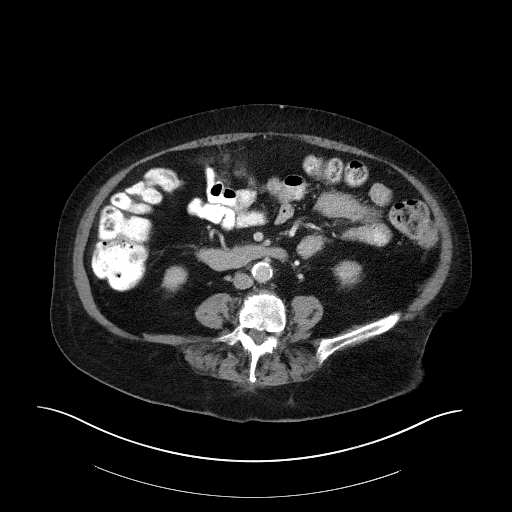
[im 63/125  mediastinal]
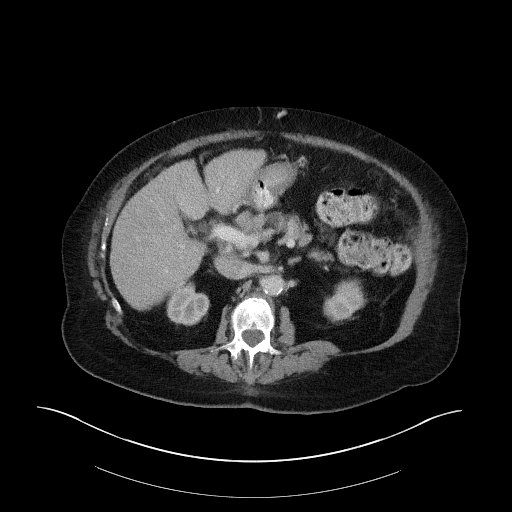
[im 75/125  mediastinal]
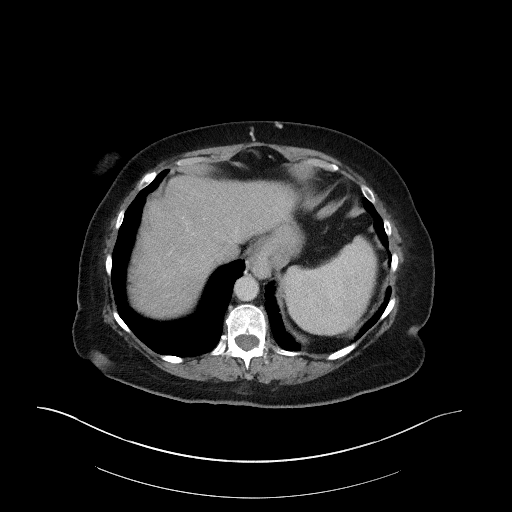
[im 87/125  mediastinal]
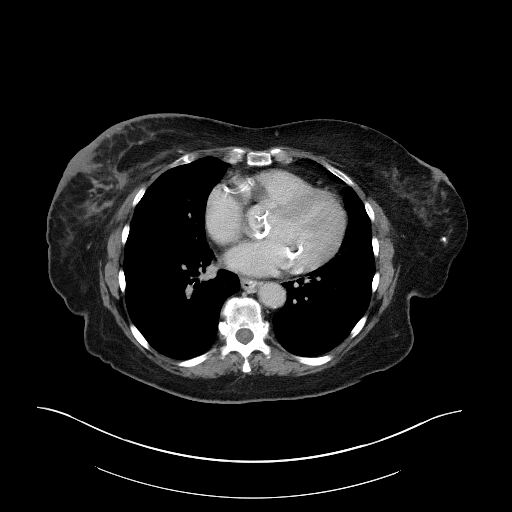
[im 100/125  mediastinal]
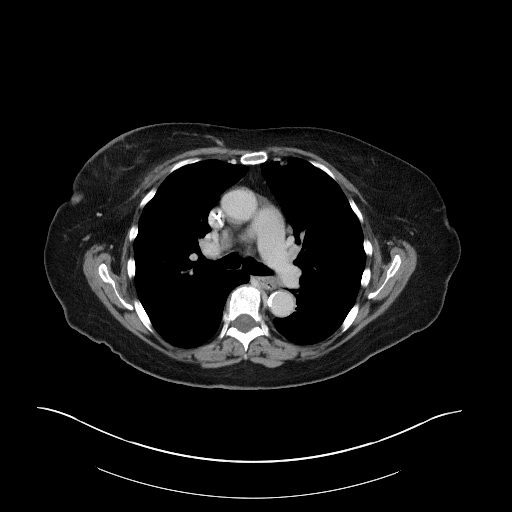
[im 112/125  mediastinal]
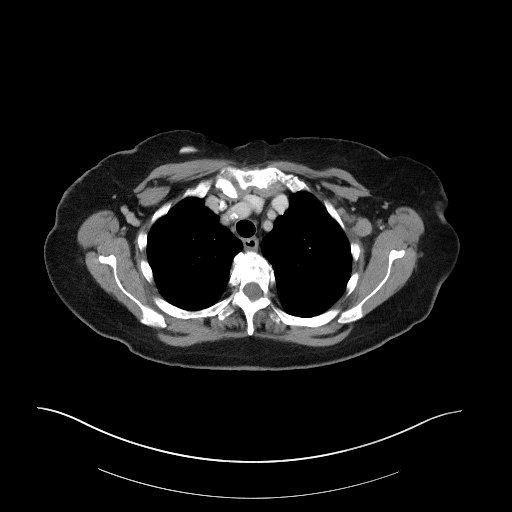
[im 112/125  bone]
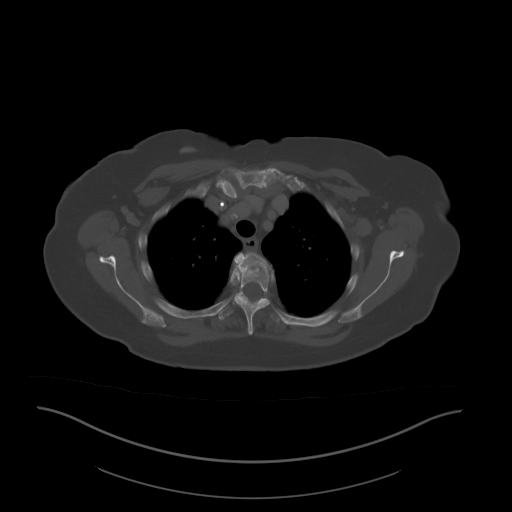

[Series 4: coronals · coronal · 0.82mm/px · 3 of 174 slices shown]
[im 35/174  mediastinal]
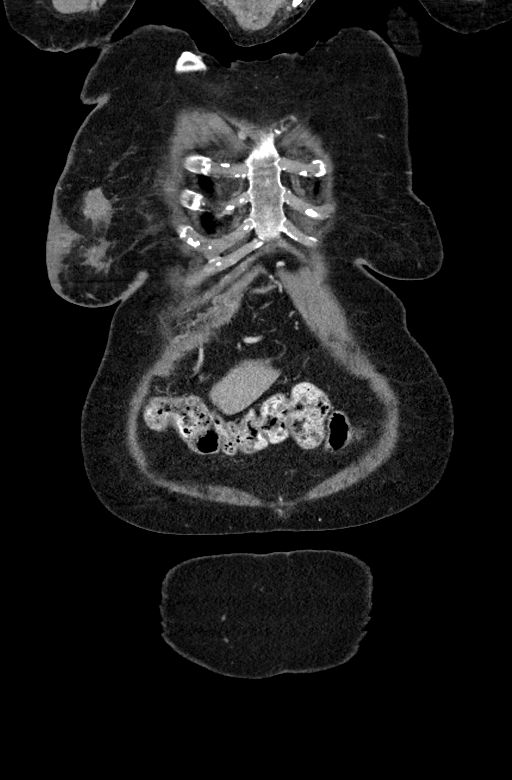
[im 70/174  mediastinal]
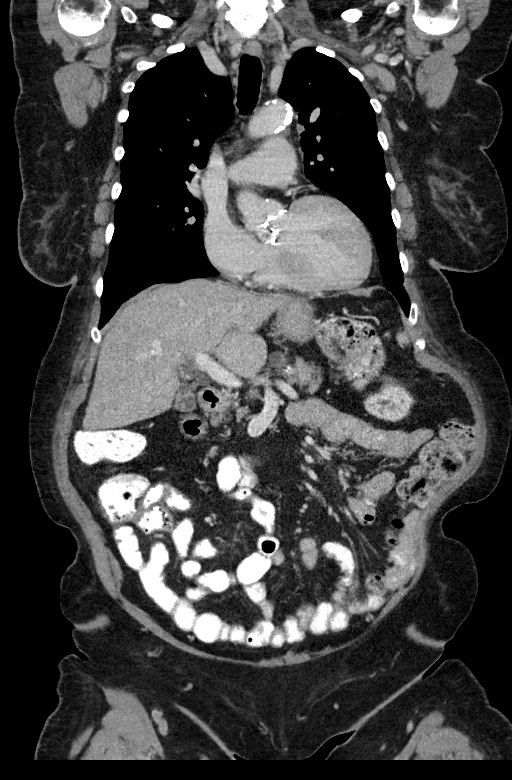
[im 104/174  mediastinal]
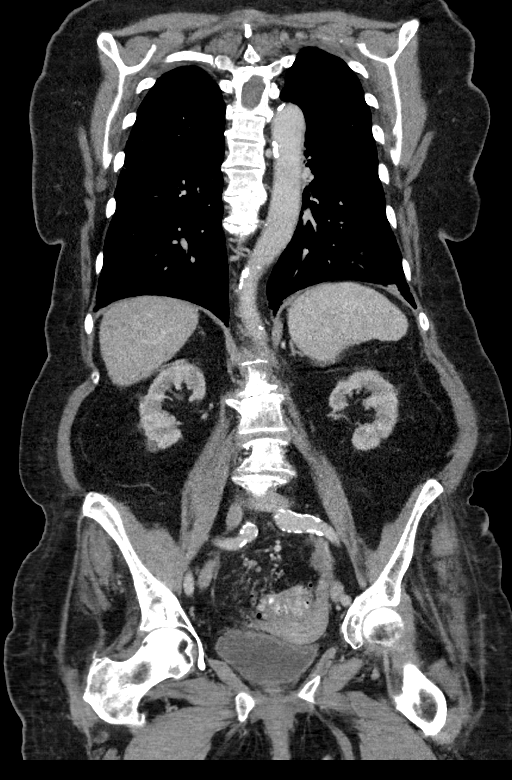

[12 of 36 positions shown; findings below may reference images not displayed]

FINDINGS: CT CHEST FINDINGS

Cardiovascular: Calcified atheromatous plaque of the thoracic aorta.
Three-vessel coronary artery disease. Aortic valvular
calcifications. Heart size top normal out pericardial effusion.
Central pulmonary vascular caliber proximally 3.2 cm of the main
pulmonary artery.

Mediastinum/Nodes: Small hypodense lymph node at the LEFT thoracic
inlet (image [DATE]) 1 cm.

Small LEFT axillary lymph nodes with rounded morphology (image [DATE])
7 mm.

LEFT supraclavicular lymph node (image [DATE]) 11 mm short axis. These
are new when compared to the PET exam from [facility].

Scattered small lymph nodes elsewhere with moderately enlarged
subcarinal nodal tissue (image [DATE]) 18 mm.

Heterogeneous moderately large mint of RIGHT hilar nodal tissue
(image [DATE]) 18 mm. No LEFT hilar lymphadenopathy. No RIGHT axillary
lymphadenopathy. Esophagus is mildly patulous in the setting of
hiatal hernia. No internal mammary lymphadenopathy.

Lungs/Pleura: New pulmonary nodules. (Image 81/3) RIGHT middle lobe
nodule along the major fissure 13 x 8 mm.

Developing nodule at the RIGHT upper lobe (image [DATE]) 5 mm.

RIGHT lower lobe pulmonary nodule along the pleural surface (image
119/3) 7 mm.

RIGHT lower lobe pulmonary nodule (image 108/3) 12 mm.

(Image 112/3) 7 mm juxtapleural nodule in the LEFT lower lobe.

Small nodules also in the LEFT upper lobe. No sign of pleural
effusion. Additional 1 cm nodule in the LEFT lower lobe (image
119/3) airways are patent. Central infrahilar nodal prominence
adjacent to pulmonary nodules in addition to RIGHT hilar adenopathy.

Musculoskeletal: Enlarging RIGHT breast mass with heterogeneous
appearance and ovoid morphologic features. Skin thickening over the
RIGHT breast as well. Central mass measuring 3.9 x 3.2 cm previously
2.7 cm greatest axial dimension in Sunday March, 2021.

CT ABDOMEN PELVIS FINDINGS

Hepatobiliary: Nodular hepatic contours. Recanalization of the
umbilical vein with body wall venous collaterals. No pericholecystic
stranding. No focal, suspicious hepatic lesion. The portal vein
remains patent.

Pancreas: Small cystic area about the body of the pancreas (image
61/2) 14 x 14 mm not changed since Saturday March, 2020 nor since Tuesday July, 2020, slightly increased since Saturday March, 2020 where it measured 12-13
mm. Signs of pancreatic atrophy. No signs of ductal dilation.

Spleen: Spleen top normal size similar to previous imaging.

Adrenals/Urinary Tract:

Adrenal glands are unremarkable. Symmetric renal enhancement. No
sign of hydronephrosis. No suspicious renal lesion or perinephric
stranding.

Urinary bladder is grossly unremarkable.

Mild renal cortical scarring. RIGHT renal cyst is unchanged in the
lower pole of the RIGHT kidney measuring just over a cm.

Stomach/Bowel: Small hiatal hernia. No signs of bowel obstruction or
acute bowel process. There is segmental colonic thickening related
to diverticular disease of the sigmoid. No gross current
inflammation

Vascular/Lymphatic:

Aortic atherosclerosis. No sign of aneurysm. Smooth contour of the
IVC. There is no gastrohepatic or hepatoduodenal ligament
lymphadenopathy. No retroperitoneal or mesenteric lymphadenopathy.

No pelvic sidewall lymphadenopathy.

Reproductive: No adnexal masses. LEFT ovary in close proximity to
sigmoid likely related to prior inflammation in the setting of
diverticular disease.

Other: No ascites.  No free air.

Musculoskeletal: No destructive bone process. No acute bone finding.
Spinal degenerative changes.
IMPRESSION: Enlarging RIGHT breast mass with adenopathy in the chest and
pulmonary nodules compatible with worsening disease and development
of metastatic disease in the chest.

Signs of hepatic cirrhosis with portal hypertension.

Small cystic area about the body of the pancreas is not changed
since Saturday March, 2020 , slightly increased since Saturday March, 2020 where it
measured 12-13 mm. Suggest dedicated MRCP with MRI in 2 years or
attention on follow-up.

Colonic thickening related to diverticular disease of the sigmoid.
No gross current inflammation.

Small hiatal hernia.

Three-vessel coronary artery disease.

Aortic Atherosclerosis ([id number]).

## 2021-12-20 ENCOUNTER — Emergency Department (HOSPITAL_COMMUNITY): Payer: Medicare Other

## 2021-12-20 ENCOUNTER — Encounter (HOSPITAL_COMMUNITY): Payer: Self-pay

## 2021-12-20 ENCOUNTER — Inpatient Hospital Stay (HOSPITAL_COMMUNITY)
Admission: EM | Admit: 2021-12-20 | Discharge: 2021-12-25 | DRG: 193 | Disposition: A | Discharge status: Skilled Nursing Facility | Payer: Medicare Other | Attending: Internal Medicine | Admitting: Internal Medicine

## 2021-12-20 ENCOUNTER — Other Ambulatory Visit: Payer: Self-pay

## 2021-12-20 DIAGNOSIS — I129 Hypertensive chronic kidney disease with stage 1 through stage 4 chronic kidney disease, or unspecified chronic kidney disease: Secondary | ICD-10-CM | POA: Diagnosis present

## 2021-12-20 DIAGNOSIS — J189 Pneumonia, unspecified organism: Secondary | ICD-10-CM

## 2021-12-20 DIAGNOSIS — E871 Hypo-osmolality and hyponatremia: Secondary | ICD-10-CM | POA: Diagnosis present

## 2021-12-20 DIAGNOSIS — Z961 Presence of intraocular lens: Secondary | ICD-10-CM | POA: Diagnosis present

## 2021-12-20 DIAGNOSIS — K2100 Gastro-esophageal reflux disease with esophagitis, without bleeding: Secondary | ICD-10-CM | POA: Diagnosis present

## 2021-12-20 DIAGNOSIS — C50911 Malignant neoplasm of unspecified site of right female breast: Secondary | ICD-10-CM | POA: Diagnosis present

## 2021-12-20 DIAGNOSIS — Z833 Family history of diabetes mellitus: Secondary | ICD-10-CM

## 2021-12-20 DIAGNOSIS — N1832 Chronic kidney disease, stage 3b: Secondary | ICD-10-CM | POA: Diagnosis present

## 2021-12-20 DIAGNOSIS — Z806 Family history of leukemia: Secondary | ICD-10-CM

## 2021-12-20 DIAGNOSIS — J181 Lobar pneumonia, unspecified organism: Principal | ICD-10-CM | POA: Diagnosis present

## 2021-12-20 DIAGNOSIS — Z825 Family history of asthma and other chronic lower respiratory diseases: Secondary | ICD-10-CM

## 2021-12-20 DIAGNOSIS — C7800 Secondary malignant neoplasm of unspecified lung: Secondary | ICD-10-CM | POA: Diagnosis present

## 2021-12-20 DIAGNOSIS — Z880 Allergy status to penicillin: Secondary | ICD-10-CM

## 2021-12-20 DIAGNOSIS — W010XXA Fall on same level from slipping, tripping and stumbling without subsequent striking against object, initial encounter: Secondary | ICD-10-CM | POA: Diagnosis present

## 2021-12-20 DIAGNOSIS — Z7401 Bed confinement status: Secondary | ICD-10-CM

## 2021-12-20 DIAGNOSIS — J90 Pleural effusion, not elsewhere classified: Secondary | ICD-10-CM | POA: Diagnosis present

## 2021-12-20 DIAGNOSIS — E785 Hyperlipidemia, unspecified: Secondary | ICD-10-CM | POA: Diagnosis present

## 2021-12-20 DIAGNOSIS — Z9841 Cataract extraction status, right eye: Secondary | ICD-10-CM

## 2021-12-20 DIAGNOSIS — Z9842 Cataract extraction status, left eye: Secondary | ICD-10-CM

## 2021-12-20 DIAGNOSIS — K7460 Unspecified cirrhosis of liver: Secondary | ICD-10-CM | POA: Diagnosis present

## 2021-12-20 DIAGNOSIS — J9601 Acute respiratory failure with hypoxia: Secondary | ICD-10-CM | POA: Diagnosis present

## 2021-12-20 DIAGNOSIS — E869 Volume depletion, unspecified: Secondary | ICD-10-CM | POA: Diagnosis present

## 2021-12-20 DIAGNOSIS — Z7189 Other specified counseling: Secondary | ICD-10-CM

## 2021-12-20 DIAGNOSIS — I455 Other specified heart block: Secondary | ICD-10-CM

## 2021-12-20 DIAGNOSIS — I10 Essential (primary) hypertension: Secondary | ICD-10-CM

## 2021-12-20 DIAGNOSIS — Z515 Encounter for palliative care: Secondary | ICD-10-CM

## 2021-12-20 DIAGNOSIS — Z8249 Family history of ischemic heart disease and other diseases of the circulatory system: Secondary | ICD-10-CM

## 2021-12-20 DIAGNOSIS — Z96653 Presence of artificial knee joint, bilateral: Secondary | ICD-10-CM | POA: Diagnosis present

## 2021-12-20 DIAGNOSIS — S92405A Nondisplaced unspecified fracture of left great toe, initial encounter for closed fracture: Secondary | ICD-10-CM | POA: Diagnosis present

## 2021-12-20 DIAGNOSIS — S92422A Displaced fracture of distal phalanx of left great toe, initial encounter for closed fracture: Secondary | ICD-10-CM

## 2021-12-20 DIAGNOSIS — Y92009 Unspecified place in unspecified non-institutional (private) residence as the place of occurrence of the external cause: Secondary | ICD-10-CM

## 2021-12-20 DIAGNOSIS — R188 Other ascites: Secondary | ICD-10-CM | POA: Diagnosis present

## 2021-12-20 DIAGNOSIS — Z20822 Contact with and (suspected) exposure to covid-19: Secondary | ICD-10-CM | POA: Diagnosis present

## 2021-12-20 DIAGNOSIS — C50919 Malignant neoplasm of unspecified site of unspecified female breast: Secondary | ICD-10-CM

## 2021-12-20 DIAGNOSIS — I480 Paroxysmal atrial fibrillation: Secondary | ICD-10-CM | POA: Diagnosis present

## 2021-12-20 LAB — BASIC METABOLIC PANEL
Anion gap: 11 (ref 5–15)
BUN: 31 mg/dL — ABNORMAL HIGH (ref 8–23)
CO2: 20 mmol/L — ABNORMAL LOW (ref 22–32)
Calcium: 9.8 mg/dL (ref 8.9–10.3)
Chloride: 98 mmol/L (ref 98–111)
Creatinine, Ser: 1.25 mg/dL — ABNORMAL HIGH (ref 0.44–1.00)
GFR, Estimated: 41 mL/min — ABNORMAL LOW (ref >60)
Glucose, Bld: 111 mg/dL — ABNORMAL HIGH (ref 70–99)
Potassium: 3.7 mmol/L (ref 3.5–5.1)
Sodium: 129 mmol/L — ABNORMAL LOW (ref 135–145)

## 2021-12-20 LAB — CBC
HCT: 37.3 % (ref 36.0–46.0)
Hemoglobin: 12.8 g/dL (ref 12.0–15.0)
MCH: 30.8 pg (ref 26.0–34.0)
MCHC: 34.3 g/dL (ref 30.0–36.0)
MCV: 89.9 fL (ref 80.0–100.0)
Platelets: 229 10*3/uL (ref 150–400)
RBC: 4.15 MIL/uL (ref 3.87–5.11)
RDW: 16.2 % — ABNORMAL HIGH (ref 11.5–15.5)
WBC: 9.6 10*3/uL (ref 4.0–10.5)
nRBC: 0 % (ref 0.0–0.2)

## 2021-12-20 LAB — CBG MONITORING, ED: Glucose-Capillary: 101 mg/dL — ABNORMAL HIGH (ref 70–99)

## 2021-12-20 LAB — AMMONIA: Ammonia: 23 umol/L (ref 9–35)

## 2021-12-20 NOTE — ED Triage Notes (Signed)
[facility] brought pt in for generalized weakness from home. Per EMS pt was seen at her PCP office for a check up and everything went well, later this evening around 6pm, pt says she was on couch and tried to get up and noticed she was "weaker than normal" and fell, injuring left big toe, pt usually walks with walker.

## 2021-12-20 NOTE — ED Notes (Signed)
RN aware of Vitals

## 2021-12-20 NOTE — ED Provider Notes (Signed)
[facility] Emergency Department Provider Note MRN:  [id number]  Arrival date & time: 12/20/21     Chief Complaint   Weakness   History of Present Illness   [name] is a 88 y.o. year-old female with a history of cirrhosis, breast cancer presenting to the ED with chief complaint of weakness.  Patient has had generalized weakness particularly in her lower extremities for 1 or 2 years.  Walks with a walker.  Sometimes she has events where she gets her legs in an awkward position and cannot move them.  Same thing happened this afternoon causing her to stumble and fall.  No head trauma, no loss of consciousness, no chest pain or shortness of breath, no abdominal pain, isolated pain to the left great toe.  Review of Systems  A thorough review of systems was obtained and all systems are negative except as noted in the HPI and PMH.   Patient's Health History    Past Medical History:  Diagnosis Date   Aortic stenosis    Cancer ([place]) 06/2018   right breast cancer   Cirrhosis ([place])    GERD (gastroesophageal reflux disease)    occasional    Hyperlipidemia    Hypertension    clearance with note Dr Nevada Crane on chart   Pneumonia    2 years ago/ states occ cough with sinus drainage- no fever   Thyroid nodule    with biopsy- states following medically    Past Surgical History:  Procedure Laterality Date   CATARACT EXTRACTION [facility]  11/30/2012   Procedure: CATARACT EXTRACTION PHACO AND INTRAOCULAR LENS PLACEMENT ([place]);  Surgeon: Williams Che, MD;  Location: [facility];  Service: Ophthalmology;  Laterality: Left;  CDE:11.49   CATARACT EXTRACTION W/PHACO Right 03/15/2013   Procedure: CATARACT EXTRACTION PHACO AND INTRAOCULAR LENS PLACEMENT (IOC);  Surgeon: Williams Che, MD;  Location: [facility];  Service: Ophthalmology;  Laterality: Right;  CDE 16.15   COLONOSCOPY     ESOPHAGOGASTRODUODENOSCOPY (EGD) WITH PROPOFOL N/A 10/07/2020   Dr. 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  Distal esophageal erosion/excoriations overlying varices.  More consistent with trauma/reflux than variceal bleeding stigmata.  Stomach incompletely seen due to presence of old blood.  Moderate sized hiatal hernia.  4 mm antral erosion, innocent appearing.  Repeat EGD in 2 days.   ESOPHAGOGASTRODUODENOSCOPY (EGD) WITH PROPOFOL N/A 10/11/2020   Dr. Laural Golden: Grade 1 varices in the distal esophagus.  LA grade B esophagitis, 1 benign-appearing intrinsic mild stenosis at 34 cm from the incisor, 4 cm hiatal hernia, single erosion in the gastric antrum and prepyloric region.   EXCISION OF SKIN TAG Right 06/17/2016   Procedure: SHAVE OF SKIN TAG RIGHT BUTTOCK;  Surgeon: Aviva Signs, MD;  Location: [facility];  Service: General;  Laterality: Right;   JOINT REPLACEMENT     left knee   MASS EXCISION Left 06/17/2016   Procedure: EXCISION SKIN MALIGNANT LESION LEFT BUTTOCK;  Surgeon: Aviva Signs, MD;  Location: [facility];  Service: General;  Laterality: Left;   PORTACATH PLACEMENT Right 07/28/2018   Procedure: INSERTION PORT-A-CATH WITH ULTRASOUND;  Surgeon: Rolm Bookbinder, MD;  Location: [facility];  Service: General;  Laterality: Right;   PUNCH BIOPSY OF SKIN Right 07/28/2018   Procedure: PUNCH BIOPSY OF SKIN RIGHT BREAST;  Surgeon: Rolm Bookbinder, MD;  Location: [facility];  Service: General;  Laterality: Right;   TOTAL KNEE ARTHROPLASTY  12/16/2011   Procedure:  TOTAL KNEE ARTHROPLASTY;  Surgeon: Gearlean Alf, MD;  Location: [facility];  Service: Orthopedics;  Laterality: Right;   TUBAL LIGATION      Family History  Problem Relation Age of Onset   Heart attack Mother    Bronchitis Father    Diabetes Sister    Leukemia Brother    Lung disease Brother    Lung disease Sister     Social History   Socioeconomic History   Marital status: Married    Spouse name: Not on file   Number of children: 2   Years of education:  some business school after HS   Highest education level: Not on file  Occupational History   Occupation: Cabin crew   Occupation: Network engineer   Tobacco Use   Smoking status: Never   Smokeless tobacco: Never  Vaping Use   Vaping Use: Never used  Substance and Sexual Activity   Alcohol use: No   Drug use: No   Sexual activity: Yes    Birth control/protection: None  Other Topics Concern   Not on file  Social History Narrative   Lives at home with her husband.   4 cups caffeine per day.   Right-handed.   Social Determinants of Health   Financial Resource Strain: Not on file  Food Insecurity: Not on file  Transportation Needs: Not on file  Physical Activity: Not on file  Stress: Not on file  Social Connections: Not on file  Intimate Partner Violence: Not on file     Physical Exam   Vitals:   12/20/21 2130 12/20/21 2230  BP: (!) 172/86 (!) 170/91  Pulse: 100 99  Resp: (!) 24 (!) 23  Temp:    SpO2: 94% 95%    CONSTITUTIONAL: Chronically ill-appearing, NAD NEURO/PSYCH:  Alert and oriented x 3, no focal deficits EYES:  eyes equal and reactive ENT/NECK:  no LAD, no JVD CARDIO: Regular rate, well-perfused, normal S1 and S2 PULM:  CTAB no wheezing or rhonchi GI/GU:  non-distended, non-tender MSK/SPINE:  No gross deformities, no edema SKIN:  no rash, atraumatic   *Additional and/or pertinent findings included in MDM below  Diagnostic and Interventional Summary    EKG Interpretation  Date/Time:  Thursday December 20 2021 20:37:45 EST Ventricular Rate:  101 PR Interval:  184 QRS Duration: 77 QT Interval:  355 QTC Calculation: 461 R Axis:   -23 Text Interpretation: Sinus tachycardia Atrial premature complex Borderline left axis deviation Anterior infarct, old Baseline wander in lead(s) III aVF Confirmed by Gerlene Fee [phone]) on 12/20/2021 11:06:15 PM       Labs Reviewed  BASIC METABOLIC PANEL - Abnormal; Notable for the following components:      Result Value    Sodium 129 (*)    CO2 20 (*)    Glucose, Bld 111 (*)    BUN 31 (*)    Creatinine, Ser 1.25 (*)    GFR, Estimated 41 (*)    All other components within normal limits  CBC - Abnormal; Notable for the following components:   RDW 16.2 (*)    All other components within normal limits  CBG MONITORING, ED - Abnormal; Notable for the following components:   Glucose-Capillary 101 (*)    All other components within normal limits  URINE CULTURE  AMMONIA  URINALYSIS, ROUTINE W REFLEX MICROSCOPIC    DG Toe Great Left  Final Result      Medications - No data to display   Procedures  /  Critical Care Procedures  ED Course and Medical Decision Making  Initial Impression and Ddx Fall, toe pain.  X-ray confirming fracture.  Neurovascularly intact, no other traumatic injuries evident.  Patient has chronic weakness which is largely unchanged.  She can move her legs but she is weak or her effort is poor.  She lives with her elderly husband who is recovering from open heart surgery, they have no other help at home.  She already walks with a walker and now she will be in a cam boot.  Husband concerned about further falls.  We will consult TOC and PT in the morning to see if they qualify for home health assistance or rehab.  Past medical/surgical history that increases complexity of ED encounter: History of chronic weakness  Interpretation of Diagnostics I personally reviewed the EKG and my interpretation is as follows: Sinus rhythm, no significant change    Labs are overall reassuring with no significant blood count or electrolyte disturbance.  Mild hyponatremia, which she has had in the past chronically.  Ammonia is normal.  Patient Reassessment and Ultimate Disposition/Management Patient will board in the emergency department for evaluation in the morning  Patient management required discussion with the following services or consulting groups:  Case Management/Social Work  Complexity of Problems  Addressed Acute complicated illness or Injury  Additional Data Reviewed and Analyzed Further history obtained from: Further history from spouse/family member  Additional Factors Impacting ED Encounter Risk Minor Procedures  Barth Kirks. [place] Small, [place] [email or web address]  Final Clinical Impressions(s) / ED Diagnoses     ICD-10-CM   1. Closed nondisplaced fracture of phalanx of left great toe, unspecified phalanx, initial encounter  S92.405A       ED Discharge Orders     None        Discharge Instructions Discussed with and Provided to Patient:   Discharge Instructions   None      Maudie Flakes, MD 12/20/21 2355

## 2021-12-21 LAB — URINALYSIS, ROUTINE W REFLEX MICROSCOPIC
Bilirubin Urine: NEGATIVE
Glucose, UA: NEGATIVE mg/dL
Ketones, ur: NEGATIVE mg/dL
Leukocytes,Ua: NEGATIVE
Nitrite: NEGATIVE
Protein, ur: >=300 mg/dL — AB
Specific Gravity, Urine: 1.014 (ref 1.005–1.030)
pH: 6 (ref 5.0–8.0)

## 2021-12-21 MED ORDER — OXYMETAZOLINE HCL 0.05 % NA SOLN
1.0000 | Freq: Once | NASAL | Status: AC
  Administered 2021-12-21: 1 via NASAL
  Filled 2021-12-21: qty 30

## 2021-12-21 MED ORDER — HYDROCODONE-ACETAMINOPHEN 5-325 MG PO TABS
1.0000 | ORAL_TABLET | Freq: Once | ORAL | Status: AC
  Administered 2021-12-21: 1 via ORAL
  Filled 2021-12-21: qty 1

## 2021-12-21 MED ORDER — LACTULOSE 10 GM/15ML PO SOLN
20.0000 g | Freq: Two times a day (BID) | ORAL | Status: DC
  Administered 2021-12-21 – 2021-12-24 (×7): 20 g via ORAL
  Filled 2021-12-21 (×9): qty 30

## 2021-12-21 MED ORDER — METOPROLOL TARTRATE 25 MG PO TABS
12.5000 mg | ORAL_TABLET | Freq: Two times a day (BID) | ORAL | Status: DC
Start: 2021-12-21 — End: 2021-12-23
  Administered 2021-12-21 – 2021-12-22 (×4): 12.5 mg via ORAL
  Filled 2021-12-21 (×4): qty 1

## 2021-12-21 MED ORDER — TRAMADOL HCL 50 MG PO TABS
50.0000 mg | ORAL_TABLET | Freq: Four times a day (QID) | ORAL | Status: DC | PRN
  Administered 2021-12-21 – 2021-12-25 (×7): 50 mg via ORAL
  Filled 2021-12-21 (×7): qty 1

## 2021-12-21 MED ORDER — ASPIRIN EC 81 MG PO TBEC
81.0000 mg | DELAYED_RELEASE_TABLET | Freq: Every day | ORAL | Status: DC
  Administered 2021-12-21 – 2021-12-25 (×5): 81 mg via ORAL
  Filled 2021-12-21 (×5): qty 1

## 2021-12-21 MED ORDER — FERROUS SULFATE 325 (65 FE) MG PO TABS
325.0000 mg | ORAL_TABLET | Freq: Every day | ORAL | Status: DC
  Administered 2021-12-22 – 2021-12-25 (×4): 325 mg via ORAL
  Filled 2021-12-21 (×4): qty 1

## 2021-12-21 MED ORDER — PANTOPRAZOLE SODIUM 40 MG PO TBEC
40.0000 mg | DELAYED_RELEASE_TABLET | Freq: Two times a day (BID) | ORAL | Status: DC
  Administered 2021-12-21 – 2021-12-25 (×8): 40 mg via ORAL
  Filled 2021-12-21 (×8): qty 1

## 2021-12-21 MED ORDER — FUROSEMIDE 40 MG PO TABS: 20.0000 mg | ORAL_TABLET | Freq: Every day | ORAL | Status: DC | PRN

## 2021-12-21 MED ORDER — ZINC GLUCONATE 50 MG PO TABS: 50.0000 mg | ORAL_TABLET | Freq: Every day | ORAL | Status: DC

## 2021-12-21 NOTE — NC FL2 (Addendum)
[place] MEDICAID FL2 LEVEL OF CARE SCREENING TOOL     IDENTIFICATION  Patient Name: [name] Birthdate: [birth year] Sex: female Admission Date (Current Location): 12/20/2021  [facility] and [place] Number:  [employer] and Address:  [place] [address], [place]      Provider Number: [id number]  Attending Physician Name and Address:  Daleen Bo, MD  Relative Name and Phone Number:  [place], [place] [phone]    Current Level of Care: Hospital Recommended Level of Care: [place] Prior Approval Number:    Date Approved/Denied:   PASRR Number: [id number]  Discharge Plan: SNF    Current Diagnoses: Patient Active Problem List   Diagnosis Date Noted   New onset atrial fibrillation ([place]) 08/01/2021   A-fib ([place]) 08/01/2021   Hepatic cirrhosis ([place])    Encephalopathy, hepatic 10/08/2020   Aortic valvar stenosis 10/08/2020   AKI (acute kidney injury) ([place]) 10/07/2020   Near syncope 10/07/2020   Moderate protein-calorie malnutrition ([place]) 10/07/2020   Malignant neoplasm of upper-outer quadrant of right breast in female, estrogen receptor negative ([place]) 07/15/2018   Peripheral polyneuropathy 12/12/2017   Symptomatic anemia 12/19/2011   Postop Hyponatremia 12/17/2011   OA (osteoarthritis) of knee 12/16/2011   KNEE, ARTHRITIS, DEGEN./OSTEO 10/10/2010   TEAR LATERAL MENISCUS 10/10/2010    Orientation RESPIRATION BLADDER Height & Weight     Self, Time, Situation, Place  Normal Continent Weight: 178 lb 9.2 oz (81 kg) Height:  5\' 4"  (162.6 cm)  BEHAVIORAL SYMPTOMS/MOOD NEUROLOGICAL BOWEL NUTRITION STATUS      Continent Diet (regular)  AMBULATORY STATUS COMMUNICATION OF NEEDS Skin   Limited Assist Verbally Normal                       Personal Care Assistance Level of Assistance  Bathing, Feeding, Dressing Bathing Assistance: Limited assistance Feeding assistance: Independent Dressing Assistance:  Limited assistance     Functional Limitations Info  Sight, Hearing, Speech Sight Info: Adequate Hearing Info: Adequate Speech Info: Adequate    SPECIAL CARE FACTORS FREQUENCY  PT (By licensed PT), OT (By licensed OT)     PT Frequency: 5 x a week OT Frequency: 5 x a week            Contractures Contractures Info: Not present    Additional Factors Info  Code Status, Allergies Code Status Info: full Allergies Info: Penicillins           Current Medications (12/21/2021):  This is the current hospital active medication list Current Facility-Administered Medications  Medication Dose Route Frequency Provider Last Rate Last Admin   traMADol (ULTRAM) tablet 50 mg  50 mg Oral Q6H PRN Daleen Bo, MD   50 mg at 12/21/21 1158   Current Outpatient Medications  Medication Sig Dispense Refill   aspirin EC 81 MG tablet Take 81 mg by mouth daily. Swallow whole.     cholecalciferol (VITAMIN D3) 25 MCG (1000 UNIT) tablet Take 1,000 Units by mouth daily.     ferrous sulfate 325 (65 FE) MG tablet Take 325 mg by mouth daily with breakfast.     furosemide (LASIX) 20 MG tablet Take 20 mg by mouth daily as needed.     hydrocortisone 1 % ointment Apply 1 application topically 2 (two) times daily. (Patient taking differently: Apply 1 application topically as needed.) 60 g 0   lactulose (CHRONULAC) 10 GM/15ML solution Take 30 mLs (20 g total) by mouth 2 (two) times  daily. (Patient taking differently: Take 20 g by mouth as needed.) 236 mL 0   metoprolol tartrate (LOPRESSOR) 25 MG tablet Take 0.5 tablets (12.5 mg total) by mouth 2 (two) times daily. 30 tablet 2   Multiple Vitamin (MULTIVITAMIN WITH MINERALS) TABS tablet Take 1 tablet by mouth daily.     zinc gluconate 50 MG tablet Take 50 mg by mouth daily.     Misc Natural Products (ESSIAC TONIC PO) Take by mouth daily.     pantoprazole (PROTONIX) 40 MG tablet Take 1 tablet (40 mg total) by mouth 2 (two) times daily before a meal. (Patient not  taking: Reported on 12/21/2021) 60 tablet 11   Facility-Administered Medications Ordered in Other Encounters  Medication Dose Route Frequency Provider Last Rate Last Admin   sodium chloride flush (NS) 0.9 % injection 10 mL  10 mL Intravenous PRN Derek Jack, MD   10 mL at 05/23/20 1025   sodium chloride flush (NS) 0.9 % injection 10 mL  10 mL Intravenous PRN Derek Jack, MD   10 mL at 05/23/20 0900     Discharge Medications: Please see After Visit Summary for a list of discharge medications.  Relevant Imaging Results:  Relevant Lab Results:   Additional Information SSN [id number]  [place], LCSW

## 2021-12-21 NOTE — ED Provider Notes (Addendum)
Emergency Medicine Observation Re-evaluation Note  [name] is a 88 y.o. female, seen on rounds today.  Pt initially presented to the ED for complaints of Weakness Currently, the patient is eating breakfast, sandwich and drinking milk.  Physical Exam  BP 138/77    Pulse 95    Temp 98.3 F (36.8 C) (Oral)    Resp (!) 23    Ht 5\' 4"  (1.626 m)    Wt 81 kg    SpO2 99%    BMI 30.65 kg/m  Physical Exam General: Elderly, hard of hearing. Cardiac: Normal heart rate Lungs: Normal respiratory Psych: No internal responsiveness  ED Course / MDM  EKG:EKG Interpretation  Date/Time:  Thursday December 20 2021 20:37:45 EST Ventricular Rate:  101 PR Interval:  184 QRS Duration: 77 QT Interval:  355 QTC Calculation: 461 R Axis:   -23 Text Interpretation: Sinus tachycardia Atrial premature complex Borderline left axis deviation Anterior infarct, old Baseline wander in lead(s) III aVF Confirmed by Gerlene Fee [phone]) on 12/20/2021 11:06:15 PM  I have reviewed the labs performed to date as well as medications administered while in observation.  Recent changes in the last 24 hours include she has been diagnosed with toe fracture, she has ongoing trouble walking for several months, now difficulty with fall yesterday causing injury.  Husband required help to get her off the floor yesterday, family member assisted.  Plan  Current plan is for PT assessment for needs, potentially discharge with resources.  Patient saw her PCP yesterday to be evaluated for a cough.  PCP talked about in-home rehab.  [name] is not under involuntary commitment.     Daleen Bo, MD 12/21/21 [phone]  PT recommended placement in skilled facility/rehab. FL 2 form filled out.    Daleen Bo, MD 12/21/21 [phone]

## 2021-12-21 NOTE — ED Notes (Signed)
Checked on pt changed urine canister,got pt a warm blanket, took husband a blanket and got him a recliner.

## 2021-12-21 NOTE — Progress Notes (Addendum)
TOC CSW presented bed offers to pt and family, they choose [facility] rehab. CSW spoke with [facility] she stated pt can d/c today if Josem Kaufmann is received. CSW to submit for prior Auth. TOC to follow.  Arlie Solomons.Vinayak Bobier, MSW, [place]   Transitions of Care Clinical Social Worker I Direct Dial: [phone]   Fax: [phone] [email or web address]

## 2021-12-21 NOTE — ED Notes (Signed)
Husband is at bedside. Tried to convince patient to get in recliner for breakfast, patient does not want too.  Patient complains of pain to bilateral feet and refuses socks or anything to touch feet.  Patient has moderate bruising to left great toe.  Called dietary for meal tray.

## 2021-12-21 NOTE — ED Notes (Signed)
PT at bedside to attempt to get patient up and walking.  Patient extremely hard of hearing and not comprehending what RN or PT is saying or asking.

## 2021-12-21 NOTE — ED Notes (Signed)
Got patient back in bed which was difficult as she cant move her feet even with assistance.  Patient had soiled self so peri care performed new brief placed and new purwick placed.

## 2021-12-21 NOTE — Evaluation (Signed)
Physical Therapy Evaluation Patient Details Name: [name] MRN: [id number] DOB: 1930-05-09 Today's Date: 12/21/2021  History of Present Illness  [name] is a 88 y.o. year-old female with a history of cirrhosis, breast cancer presenting to the ED with chief complaint of weakness.     Patient has had generalized weakness particularly in her lower extremities for 1 or 2 years.  Walks with a walker.  Sometimes she has events where she gets her legs in an awkward position and cannot move them.  Same thing happened this afternoon causing her to stumble and fall.  No head trauma, no loss of consciousness, no chest pain or shortness of breath, no abdominal pain, isolated pain to the left great toe.   Clinical Impression  Patient demonstrates slow labored movement for sitting up at bedside with c/o increased left ankle/foot pain with movement, at high risk for falls, limited to a few slow labored side steps before having to sit due to poor standing balance and increasing left foot pain.  Patient will benefit from continued skilled physical therapy in hospital and recommended venue below to increase strength, balance, endurance for safe ADLs and gait.         Recommendations for follow up therapy are one component of a multi-disciplinary discharge planning process, led by the attending physician.  Recommendations may be updated based on patient status, additional functional criteria and insurance authorization.  Follow Up Recommendations Skilled nursing-short term rehab (<3 hours/day)    Assistance Recommended at Discharge Intermittent Supervision/Assistance  Patient can return home with the following  A lot of help with walking and/or transfers;A lot of help with bathing/dressing/bathroom;Assistance with cooking/housework;Assist for transportation;Help with stairs or ramp for entrance    Equipment Recommendations None recommended by PT  Recommendations for Other Services       Functional  Status Assessment Patient has had a recent decline in their functional status and demonstrates the ability to make significant improvements in function in a reasonable and predictable amount of time.     Precautions / Restrictions Precautions Precautions: Fall Required Braces or Orthoses: Other Brace Other Brace: CAM boot left foot Restrictions Weight Bearing Restrictions: Yes LLE Weight Bearing: Weight bearing as tolerated      Mobility  Bed Mobility Overal bed mobility: Needs Assistance Bed Mobility: Supine to Sit     Supine to sit: Mod assist     General bed mobility comments: slow labored movement    Transfers Overall transfer level: Needs assistance Equipment used: Rolling walker (2 wheels) Transfers: Sit to/from Stand, Bed to chair/wheelchair/BSC Sit to Stand: Mod assist   Step pivot transfers: Mod assist       General transfer comment: slow labored movement with difficulty weightbearing on LLE due to increased pain    Ambulation/Gait Ambulation/Gait assistance: Mod assist, Max assist Gait Distance (Feet): 5 Feet Assistive device: Rolling walker (2 wheels) Gait Pattern/deviations: Decreased step length - right, Decreased step length - left, Decreased stance time - left, Decreased stride length, Decreased weight shift to left, Shuffle, Antalgic Gait velocity: decreased     General Gait Details: limited to a few slow labored side steps with limited weightbearing on LLE due to increased toe pain, wearing CAM boot,  Stairs            Wheelchair Mobility    Modified Rankin (Stroke Patients Only)       Balance Overall balance assessment: Needs assistance Sitting-balance support: Feet supported, No upper extremity supported Sitting balance-Leahy Scale: Fair  Sitting balance - Comments: seated at EOB   Standing balance support: During functional activity, Bilateral upper extremity supported, Reliant on assistive device for balance Standing  balance-Leahy Scale: Poor Standing balance comment: using RW                             Pertinent Vitals/Pain Pain Assessment Pain Assessment: Faces Faces Pain Scale: Hurts whole lot Pain Location: left big toe when weightbearing Pain Descriptors / Indicators: Sore, Grimacing, Guarding Pain Intervention(s): Limited activity within patient's tolerance, Monitored during session, Repositioned    Home Living Family/patient expects to be discharged to:: Private residence Living Arrangements: Spouse/significant other Available Help at Discharge: Family;Available PRN/intermittently Type of Home: House Home Access: Level entry       Home Layout: One level Home Equipment: Conservation officer, nature (2 wheels);Toilet riser;Cane - single point;Grab bars - tub/shower      Prior Function Prior Level of Function : Independent/Modified Independent             Mobility Comments: household ambulator using RW ADLs Comments: assisted by family     Hand Dominance        Extremity/Trunk Assessment   Upper Extremity Assessment Upper Extremity Assessment: Generalized weakness    Lower Extremity Assessment Lower Extremity Assessment: Generalized weakness;LLE deficits/detail LLE Deficits / Details: grossly 3+/5 LLE: Unable to fully assess due to pain;Unable to fully assess due to immobilization LLE Sensation: WNL LLE Coordination: WNL    Cervical / Trunk Assessment Cervical / Trunk Assessment: Normal  Communication   Communication: HOH  Cognition Arousal/Alertness: Awake/alert Behavior During Therapy: WFL for tasks assessed/performed Overall Cognitive Status: Within Functional Limits for tasks assessed                                          General Comments      Exercises     Assessment/Plan    PT Assessment Patient needs continued PT services  PT Problem List Decreased strength;Decreased activity tolerance;Decreased balance;Decreased  mobility;Pain       PT Treatment Interventions DME instruction;Gait training;Stair training;Functional mobility training;Therapeutic activities;Therapeutic exercise;Patient/family education    PT Goals (Current goals can be found in the Care Plan section)  Acute Rehab PT Goals Patient Stated Goal: return home after rehab PT Goal Formulation: With patient Time For Goal Achievement: 01/04/22 Potential to Achieve Goals: Good    Frequency Min 2X/week     Co-evaluation               AM-PAC PT "6 Clicks" Mobility  Outcome Measure Help needed turning from your back to your side while in a flat bed without using bedrails?: A Little   Help needed moving to and from a bed to a chair (including a wheelchair)?: A Lot Help needed standing up from a chair using your arms (e.g., wheelchair or bedside chair)?: A Lot Help needed to walk in hospital room?: A Lot Help needed climbing 3-5 steps with a railing? : A Lot 6 Click Score: 11    End of Session   Activity Tolerance: Patient tolerated treatment well;Patient limited by fatigue;Patient limited by pain Patient left: in chair;with call bell/phone within reach Nurse Communication: Mobility status PT Visit Diagnosis: Unsteadiness on feet (R26.81);Other abnormalities of gait and mobility (R26.89);Muscle weakness (generalized) (M62.81)    Time: 0923-3007 PT Time Calculation (min) (ACUTE ONLY): 23 min  Charges:   PT Evaluation $PT Eval Moderate Complexity: 1 Mod PT Treatments $Therapeutic Activity: 23-37 mins        11:24 AM, 12/21/21 Lonell Grandchild, MPT Physical Therapist with [facility] [phone] office [phone] mobile phone

## 2021-12-21 NOTE — Plan of Care (Signed)
°  Problem: Acute Rehab PT Goals(only PT should resolve) Goal: Pt Will Go Supine/Side To Sit Outcome: Progressing Flowsheets (Taken 12/21/2021 1125) Pt will go Supine/Side to Sit:  with minimal assist  with moderate assist Goal: Patient Will Transfer Sit To/From Stand Outcome: Progressing Flowsheets (Taken 12/21/2021 1125) Patient will transfer sit to/from stand:  with minimal assist  with moderate assist Goal: Pt Will Transfer Bed To Chair/Chair To Bed Outcome: Progressing Flowsheets (Taken 12/21/2021 1125) Pt will Transfer Bed to Chair/Chair to Bed:  with min assist  with mod assist Goal: Pt Will Ambulate Outcome: Progressing Flowsheets (Taken 12/21/2021 1125) Pt will Ambulate:  25 feet  with moderate assist  with rolling walker   11:26 AM, 12/21/21 Lonell Grandchild, MPT Physical Therapist with [facility] [phone] office [phone] mobile phone

## 2021-12-21 NOTE — Progress Notes (Signed)
TOC CSW spoke with pt's husband [name] to inform him about PT recommendations for SNF. He stated him and his daughter are switching and gave CSW permission to speak with daughter about recommendations .   CSW spoke with pt daughter [name] and explained pt has been recommended for skilled nursing. Pt daughter stated pt wanted to go home however she is unable to bear weight on her foot. CSW explained the difference with skilled nursing vs home with Home Health services.  Pt and family decided they would like pt to be placed in SNF to help pt heal. Pt and family requesting pt to stay in [facility] CSW to work pt up for SNF. TOC to follow.   Arlie Solomons.Bassem Bernasconi, MSW, [place]   Transitions of Care Clinical Social Worker I Direct Dial: [phone]   Fax: [phone] [email or web address]

## 2021-12-21 NOTE — Progress Notes (Signed)
Pt's autth pending.   Arlie Solomons.Jaliah Foody, MSW, [place]   Transitions of Care Clinical Social Worker I Direct Dial: [phone]   Fax: [phone] [email or web address]

## 2021-12-22 NOTE — ED Notes (Signed)
Pt placed on regular hospital bed for comfort

## 2021-12-22 NOTE — ED Notes (Signed)
Spoke with [facility] to remind him about getting a hospital bed.

## 2021-12-22 NOTE — ED Notes (Signed)
Requested a hospital bed for pt.

## 2021-12-23 ENCOUNTER — Emergency Department (HOSPITAL_COMMUNITY): Payer: Medicare Other

## 2021-12-23 DIAGNOSIS — C50919 Malignant neoplasm of unspecified site of unspecified female breast: Secondary | ICD-10-CM

## 2021-12-23 DIAGNOSIS — J181 Lobar pneumonia, unspecified organism: Secondary | ICD-10-CM | POA: Diagnosis present

## 2021-12-23 DIAGNOSIS — S92405A Nondisplaced unspecified fracture of left great toe, initial encounter for closed fracture: Secondary | ICD-10-CM

## 2021-12-23 DIAGNOSIS — E871 Hypo-osmolality and hyponatremia: Secondary | ICD-10-CM

## 2021-12-23 DIAGNOSIS — I10 Essential (primary) hypertension: Secondary | ICD-10-CM

## 2021-12-23 DIAGNOSIS — R188 Other ascites: Secondary | ICD-10-CM

## 2021-12-23 DIAGNOSIS — N1832 Chronic kidney disease, stage 3b: Secondary | ICD-10-CM

## 2021-12-23 DIAGNOSIS — J9601 Acute respiratory failure with hypoxia: Secondary | ICD-10-CM

## 2021-12-23 DIAGNOSIS — S92422A Displaced fracture of distal phalanx of left great toe, initial encounter for closed fracture: Secondary | ICD-10-CM

## 2021-12-23 DIAGNOSIS — K7460 Unspecified cirrhosis of liver: Secondary | ICD-10-CM

## 2021-12-23 LAB — CBC WITH DIFFERENTIAL/PLATELET
Abs Immature Granulocytes: 0.03 10*3/uL (ref 0.00–0.07)
Basophils Absolute: 0.1 10*3/uL (ref 0.0–0.1)
Basophils Relative: 1 %
Eosinophils Absolute: 0.2 10*3/uL (ref 0.0–0.5)
Eosinophils Relative: 2 %
HCT: 36.3 % (ref 36.0–46.0)
Hemoglobin: 11.4 g/dL — ABNORMAL LOW (ref 12.0–15.0)
Immature Granulocytes: 0 %
Lymphocytes Relative: 15 %
Lymphs Abs: 1.5 10*3/uL (ref 0.7–4.0)
MCH: 29.1 pg (ref 26.0–34.0)
MCHC: 31.4 g/dL (ref 30.0–36.0)
MCV: 92.6 fL (ref 80.0–100.0)
Monocytes Absolute: 1.2 10*3/uL — ABNORMAL HIGH (ref 0.1–1.0)
Monocytes Relative: 12 %
Neutro Abs: 7.1 10*3/uL (ref 1.7–7.7)
Neutrophils Relative %: 70 %
Platelets: 191 10*3/uL (ref 150–400)
RBC: 3.92 MIL/uL (ref 3.87–5.11)
RDW: 16.5 % — ABNORMAL HIGH (ref 11.5–15.5)
WBC: 10 10*3/uL (ref 4.0–10.5)
nRBC: 0 % (ref 0.0–0.2)

## 2021-12-23 LAB — BASIC METABOLIC PANEL
Anion gap: 10 (ref 5–15)
BUN: 37 mg/dL — ABNORMAL HIGH (ref 8–23)
CO2: 19 mmol/L — ABNORMAL LOW (ref 22–32)
Calcium: 8.9 mg/dL (ref 8.9–10.3)
Chloride: 96 mmol/L — ABNORMAL LOW (ref 98–111)
Creatinine, Ser: 1.39 mg/dL — ABNORMAL HIGH (ref 0.44–1.00)
GFR, Estimated: 36 mL/min — ABNORMAL LOW (ref >60)
Glucose, Bld: 82 mg/dL (ref 70–99)
Potassium: 4.5 mmol/L (ref 3.5–5.1)
Sodium: 125 mmol/L — ABNORMAL LOW (ref 135–145)

## 2021-12-23 LAB — RESP PANEL BY RT-PCR (FLU A&B, COVID) ARPGX2
Influenza A by PCR: NEGATIVE
Influenza B by PCR: NEGATIVE
SARS Coronavirus 2 by RT PCR: NEGATIVE

## 2021-12-23 LAB — LACTIC ACID, PLASMA: Lactic Acid, Venous: 1.1 mmol/L (ref 0.5–1.9)

## 2021-12-23 LAB — BRAIN NATRIURETIC PEPTIDE: B Natriuretic Peptide: 312 pg/mL — ABNORMAL HIGH (ref 0.0–100.0)

## 2021-12-23 LAB — PROCALCITONIN: Procalcitonin: 0.41 ng/mL

## 2021-12-23 MED ORDER — SODIUM CHLORIDE 0.9 % IV SOLN
500.0000 mg | Freq: Once | INTRAVENOUS | Status: AC
  Administered 2021-12-23: 500 mg via INTRAVENOUS
  Filled 2021-12-23: qty 5

## 2021-12-23 MED ORDER — ACETAMINOPHEN 650 MG RE SUPP: 650.0000 mg | Freq: Four times a day (QID) | RECTAL | Status: DC | PRN

## 2021-12-23 MED ORDER — HEPARIN SODIUM (PORCINE) 5000 UNIT/ML IJ SOLN
5000.0000 [IU] | Freq: Three times a day (TID) | INTRAMUSCULAR | Status: DC
  Administered 2021-12-23 – 2021-12-25 (×5): 5000 [IU] via SUBCUTANEOUS
  Filled 2021-12-23 (×7): qty 1

## 2021-12-23 MED ORDER — SODIUM CHLORIDE 0.9 % IV BOLUS
2000.0000 mL | Freq: Once | INTRAVENOUS | Status: AC
  Administered 2021-12-23: 2000 mL via INTRAVENOUS

## 2021-12-23 MED ORDER — VITAMIN D 25 MCG (1000 UNIT) PO TABS
1000.0000 [IU] | ORAL_TABLET | Freq: Every day | ORAL | Status: DC
  Administered 2021-12-23 – 2021-12-25 (×3): 1000 [IU] via ORAL
  Filled 2021-12-23 (×3): qty 1

## 2021-12-23 MED ORDER — AZITHROMYCIN 250 MG PO TABS
500.0000 mg | ORAL_TABLET | Freq: Every day | ORAL | Status: DC
  Administered 2021-12-24 – 2021-12-25 (×2): 500 mg via ORAL
  Filled 2021-12-23 (×2): qty 2

## 2021-12-23 MED ORDER — SODIUM CHLORIDE 0.9 % IV SOLN
1.0000 g | INTRAVENOUS | Status: DC
  Administered 2021-12-24 – 2021-12-25 (×2): 1 g via INTRAVENOUS
  Filled 2021-12-23 (×2): qty 10

## 2021-12-23 MED ORDER — ONDANSETRON HCL 4 MG/2ML IJ SOLN: 4.0000 mg | Freq: Four times a day (QID) | INTRAMUSCULAR | Status: DC | PRN

## 2021-12-23 MED ORDER — SODIUM CHLORIDE 0.9 % IV SOLN
1.0000 g | Freq: Once | INTRAVENOUS | Status: AC
  Administered 2021-12-23: 1 g via INTRAVENOUS
  Filled 2021-12-23: qty 10

## 2021-12-23 MED ORDER — ONDANSETRON HCL 4 MG PO TABS: 4.0000 mg | ORAL_TABLET | Freq: Four times a day (QID) | ORAL | Status: DC | PRN

## 2021-12-23 MED ORDER — SODIUM CHLORIDE 0.9 % IV SOLN: INTRAVENOUS | Status: AC

## 2021-12-23 MED ORDER — ADULT MULTIVITAMIN W/MINERALS CH
1.0000 | ORAL_TABLET | Freq: Every day | ORAL | Status: DC
  Administered 2021-12-23 – 2021-12-25 (×3): 1 via ORAL
  Filled 2021-12-23 (×2): qty 1

## 2021-12-23 MED ORDER — ACETAMINOPHEN 325 MG PO TABS: 650.0000 mg | ORAL_TABLET | Freq: Four times a day (QID) | ORAL | Status: DC | PRN

## 2021-12-23 NOTE — Progress Notes (Signed)
Message sent to on call. Pt's husband and son at bedside and are advocating for the pt to have a continuous iv fluid going bc "she has not been eating or drinking much, and she may be dehydrated." No new orders at this time will continue to monitor.

## 2021-12-23 NOTE — ED Notes (Signed)
Placed pt on 2L of O2 for comfort  

## 2021-12-23 NOTE — Assessment & Plan Note (Signed)
Continue lactulose

## 2021-12-23 NOTE — ED Notes (Signed)
Pt daughter states that pt cough has became weak and unproductive.  MD notified

## 2021-12-23 NOTE — ED Provider Notes (Signed)
I was asked to evaluate the patient by nursing staff.  Patient has been awaiting rehab facility due to generalized weakness.  Patient has had increasing cough and appeared short of breath.  She is now on 2 L of oxygen.  Chest x-ray was performed and I visualized the film and indicates multifocal pneumonia.  On exam, patient has wheezing crackles bilaterally.  Due to acute worsening of her condition, she will be admitted for IV antibiotics.   Ripley Fraise, MD 12/23/21 [phone]

## 2021-12-23 NOTE — Progress Notes (Signed)
I was asked to admit this patient who appears to have multilobar pneumonia.  Labs have not yet been done on this patient.  ED physician was told that current lab work will be needed prior to admission.  ED doc will put in the labs.  ED should renotify the flow manager when the lab is done and [facility] will be glad to admit the patient.

## 2021-12-23 NOTE — Assessment & Plan Note (Addendum)
Currently in sinus rhythm Not a candidate for anticoagulation secondary to GI bleed and liver cirrhosis Holding metoprolol secondary to soft blood pressures>>restart

## 2021-12-23 NOTE — Assessment & Plan Note (Addendum)
Continue ceftriaxone and azithromycin Check PCT 0.41 Check lactic acid 1.1 2/27 CTA chest--no PE; scattered nodular densities in both lungs largest in the left lower lobe measuring approximately 2.6 cm. There is interval increase in number and size of metastatic lesions in the lung fields. Small to moderate bilateral pleural effusions are seen, more on the right side;  patchy infiltrates in the lower lung fields D/c with 4 more days cefdinir and azithro

## 2021-12-23 NOTE — Assessment & Plan Note (Addendum)
Secondary to volume depletion and poor solute intake Given 2 L normal saline in the ED A.m. BMP Hold furosemide Liberalize diet Start IV NS

## 2021-12-23 NOTE — Assessment & Plan Note (Addendum)
Patient had shortness of breath with oxygen saturation 90% Due to pneumonia and pleural effusions Stable on 2 L presently Wean oxygen for saturation greater than 92% 2/27 CTA chest--no PE; small bilateral pleural effusions; increase number of nodules and increase mediastinal lymphadenopathy

## 2021-12-23 NOTE — Assessment & Plan Note (Addendum)
Follows with Dr. [name] Per last visit on 09/11/21--all treatment has been stopped due to liver cirrhosis and bleeding Plan was to transition to palliative/hospice care Dr. [name] (med/onc) was consulted and confirmed plan for no further active cancer treatment and to go to SNF with ultimate transition to home with hospice

## 2021-12-23 NOTE — Assessment & Plan Note (Addendum)
Noted on xray foot Cam boot placed PT eval

## 2021-12-23 NOTE — Hospital Course (Signed)
88 year old female with a history of metastatic breast cancer, liver cirrhosis, hypertension, hyperlipidemia, GI bleed, severe aortic stenosis, paroxysmal atrial fibrillation presenting with 1 day history of generalized weakness and a mechanical fall.  The patient presented to the emergency department on 12/20/2021 evening with the above symptoms.  There was also history of a nonproductive cough and chest congestion for the past week prior to admission.  She went to see her PCP on the day of admission and was prescribed albuterol and other temporizing measures, but she had not started on any of the medications.  There was no antibiotics prescribed according to the patient's spouse.  Nevertheless, the patient has had generalized weakness for a number of months and has had to use a walker to assist with ambulation.  However, the patient's spouse noted that the patient had increasing generalized weakness, coughing, and chest congestion which is why he took her to see the PCP on that day.  She has also had some decreased oral intake.  The patient got up from the couch, but because of weakness resulted in a mechanical fall injuring her left great toe.  Prior to this, the patient has not had any fevers, chills, chest pain, shortness of breath, nausea, vomiting, diarrhea, abdominal pain, hematochezia, melena, dysuria, hematuria. Review the medical record shows that the patient had a recent visit with Dr. [name] on 09/11/2021.  At that time, the decision was made to transition the patient to palliative care/hospice care secondary to her progressive generalized weakness, liver disease, and bleeding.  Therefore, further therapy for her metastatic breast cancer was discontinued. In the ED, the patient was afebrile hemodynamically stable on room air.  She was seen by physical therapy who recommended skilled nursing facility.  TOC assisted with transition to [facility] rehab.  The patient was waiting for insurance authorization.   However on the evening of 12/22/2021, the patient was noted to have some mild hypoxia with increasing oxygen requirement and coughing and some shortness of breath.  A chest x-ray was obtained on the evening of 12/22/2021 and showed opacities in the right middle lobe, right lower lobe and small bilateral pleural effusions.  Labs were repeated and showed WBC 7.0, hemoglobin 9.4, platelets 191,000.  Sodium 125, potassium 4.5, bicarbonate 19, serum creatinine 1.39.  The patient was started on ceftriaxone and azithromycin.  She was given 2 L of fluids.  Admission was requested for further treatment and evaluation of her pneumonia and hyponatremia.

## 2021-12-23 NOTE — Assessment & Plan Note (Addendum)
Initially Holding metoprolol titrate secondary to soft blood pressures -restart at BPs improve

## 2021-12-23 NOTE — H&P (Addendum)
History and Physical    Patient: [name] ZOX:[id number] DOB: 1930-03-31 DOA: 12/20/2021 DOS: the patient was seen and examined on 12/23/2021 PCP: [name], MD  Patient coming from: Home  Chief Complaint:  Chief Complaint  Patient presents with   Weakness    HPI: [name] is a 88 year old female with a history of metastatic breast cancer, liver cirrhosis, hypertension, hyperlipidemia, GI bleed, severe aortic stenosis, paroxysmal atrial fibrillation presenting with 1 day history of generalized weakness and a mechanical fall.  The patient presented to the emergency department on 12/20/2021 evening with the above symptoms.  There was also history of a nonproductive cough and chest congestion for the past week prior to admission.  She went to see her PCP on the day of admission and was prescribed albuterol and other temporizing measures, but she had not started on any of the medications.  There was no antibiotics prescribed according to the patient's spouse.  Nevertheless, the patient has had generalized weakness for a number of months and has had to use a walker to assist with ambulation.  However, the patient's spouse noted that the patient had increasing generalized weakness, coughing, and chest congestion which is why he took her to see the PCP on that day.  She has also had some decreased oral intake.  The patient got up from the couch, but because of weakness resulted in a mechanical fall injuring her left great toe.  Prior to this, the patient has not had any fevers, chills, chest pain, shortness of breath, nausea, vomiting, diarrhea, abdominal pain, hematochezia, melena, dysuria, hematuria. Review the medical record shows that the patient had a recent visit with Dr. [name] on 09/11/2021.  At that time, the decision was made to transition the patient to palliative care/hospice care secondary to her progressive generalized weakness, liver disease, and bleeding.  Therefore, further  therapy for her metastatic breast cancer was discontinued. In the ED, the patient was afebrile hemodynamically stable on room air.  She was seen by physical therapy who recommended skilled nursing facility.  TOC assisted with transition to [facility] rehab.  The patient was waiting for insurance authorization.  However on the evening of 12/22/2021, the patient was noted to have some mild hypoxia with increasing oxygen requirement and coughing and some shortness of breath.  A chest x-ray was obtained on the evening of 12/22/2021 and showed opacities in the right middle lobe, right lower lobe and small bilateral pleural effusions.  Labs were repeated and showed WBC 7.0, hemoglobin 9.4, platelets 191,000.  Sodium 125, potassium 4.5, bicarbonate 19, serum creatinine 1.39.  The patient was started on ceftriaxone and azithromycin.  She was given 2 L of fluids.  Admission was requested for further treatment and evaluation of her pneumonia and hyponatremia.   Review of Systems: As mentioned in the history of present illness. All other systems reviewed and are negative. Past Medical History:  Diagnosis Date   Aortic stenosis    Cancer ([place]) 06/2018   right breast cancer   Cirrhosis ([place])    GERD (gastroesophageal reflux disease)    occasional    Hyperlipidemia    Hypertension    clearance with note Dr Nevada Crane on chart   Pneumonia    2 years ago/ states occ cough with sinus drainage- no fever   Thyroid nodule    with biopsy- states following medically   Past Surgical History:  Procedure Laterality Date   CATARACT EXTRACTION [facility]  11/30/2012   Procedure: CATARACT  EXTRACTION PHACO AND INTRAOCULAR LENS PLACEMENT (IOC);  Surgeon: Williams Che, MD;  Location: [facility];  Service: Ophthalmology;  Laterality: Left;  CDE:11.49   CATARACT EXTRACTION W/PHACO Right 03/15/2013   Procedure: CATARACT EXTRACTION PHACO AND INTRAOCULAR LENS PLACEMENT (IOC);  Surgeon: Williams Che, MD;  Location: [facility];  Service:  Ophthalmology;  Laterality: Right;  CDE 16.15   COLONOSCOPY     ESOPHAGOGASTRODUODENOSCOPY (EGD) WITH PROPOFOL N/A 10/07/2020   Dr. 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  Distal esophageal erosion/excoriations overlying varices.  More consistent with trauma/reflux than variceal bleeding stigmata.  Stomach incompletely seen due to presence of old blood.  Moderate sized hiatal hernia.  4 mm antral erosion, innocent appearing.  Repeat EGD in 2 days.   ESOPHAGOGASTRODUODENOSCOPY (EGD) WITH PROPOFOL N/A 10/11/2020   Dr. Laural Golden: Grade 1 varices in the distal esophagus.  LA grade B esophagitis, 1 benign-appearing intrinsic mild stenosis at 34 cm from the incisor, 4 cm hiatal hernia, single erosion in the gastric antrum and prepyloric region.   EXCISION OF SKIN TAG Right 06/17/2016   Procedure: SHAVE OF SKIN TAG RIGHT BUTTOCK;  Surgeon: Aviva Signs, MD;  Location: [facility];  Service: General;  Laterality: Right;   JOINT REPLACEMENT     left knee   MASS EXCISION Left 06/17/2016   Procedure: EXCISION SKIN MALIGNANT LESION LEFT BUTTOCK;  Surgeon: Aviva Signs, MD;  Location: [facility];  Service: General;  Laterality: Left;   PORTACATH PLACEMENT Right 07/28/2018   Procedure: INSERTION PORT-A-CATH WITH ULTRASOUND;  Surgeon: Rolm Bookbinder, MD;  Location: [place];  Service: General;  Laterality: Right;   PUNCH BIOPSY OF SKIN Right 07/28/2018   Procedure: PUNCH BIOPSY OF SKIN RIGHT BREAST;  Surgeon: Rolm Bookbinder, MD;  Location: [place];  Service: General;  Laterality: Right;   TOTAL KNEE ARTHROPLASTY  12/16/2011   Procedure: TOTAL KNEE ARTHROPLASTY;  Surgeon: Gearlean Alf, MD;  Location: [facility];  Service: Orthopedics;  Laterality: Right;   TUBAL LIGATION     Social History:  reports that she has never smoked. She has never used smokeless tobacco. She reports that she does not drink alcohol and does not use drugs.  Allergies   Allergen Reactions   Penicillins Rash    Has patient had a PCN reaction causing immediate rash, facial/tongue/throat swelling, SOB or lightheadedness with hypotension: No Has patient had a PCN reaction causing severe rash involving mucus membranes or skin necrosis: No Has patient had a PCN reaction that required hospitalization: No Has patient had a PCN reaction occurring within the last 10 years: No If all of the above answers are "NO", then may proceed with Cephalosporin use.     Family History  Problem Relation Age of Onset   Heart attack Mother    Bronchitis Father    Diabetes Sister    Leukemia Brother    Lung disease Brother    Lung disease Sister     Prior to Admission medications   Medication Sig Start Date End Date Taking? Authorizing Provider  aspirin EC 81 MG tablet Take 81 mg by mouth daily. Swallow whole.   Yes [provider]  cholecalciferol (VITAMIN D3) 25 MCG (1000 UNIT) tablet Take 1,000 Units by mouth daily.   Yes [provider]  ferrous sulfate 325 (65 FE) MG tablet Take 325 mg by mouth daily with breakfast.   Yes [provider]  furosemide (LASIX) 20 MG tablet Take 20  mg by mouth daily as needed. 09/04/21  Yes [provider]  hydrocortisone 1 % ointment Apply 1 application topically 2 (two) times daily. Patient taking differently: Apply 1 application topically as needed. 09/18/18  Yes Lockamy, Randi L, NP-C  lactulose (CHRONULAC) 10 GM/15ML solution Take 30 mLs (20 g total) by mouth 2 (two) times daily. Patient taking differently: Take 20 g by mouth as needed. 10/12/20  Yes Nita Sells, MD  metoprolol tartrate (LOPRESSOR) 25 MG tablet Take 0.5 tablets (12.5 mg total) by mouth 2 (two) times daily. 08/02/21 12/21/21 Yes [place] Donnamarie Poag, DO  Multiple Vitamin (MULTIVITAMIN WITH MINERALS) TABS tablet Take 1 tablet by mouth daily.   Yes [provider]  zinc gluconate 50 MG tablet Take 50 mg by mouth daily.   Yes  [provider]  Misc Natural Products (ESSIAC TONIC PO) Take by mouth daily.    [provider]  pantoprazole (PROTONIX) 40 MG tablet Take 1 tablet (40 mg total) by mouth 2 (two) times daily before a meal. Patient not taking: Reported on 12/21/2021 10/12/20   Nita Sells, MD    Physical Exam: Vitals:   12/23/21 0500 12/23/21 0530 12/23/21 0600 12/23/21 0848  BP: 135/67 (!) 122/57 117/62 117/75  Pulse: 66 67 68 73  Resp:    18  Temp:    98 F (36.7 C)  TempSrc:    Oral  SpO2: 90% 96% 96% 96%  Weight:      Height:       GENERAL:  A&O x 3, NAD, well developed, cooperative, follows commands HEENT: /AT, No thrush, No icterus, No oral ulcers Neck:  No neck mass, No meningismus, soft, supple CV: RRR, no S3, no S4, no rub, no JVD Lungs:  bilateral rales R>L Abd: soft/NT +BS, nondistended Ext: No edema, no lymphangitis, no cyanosis, no rashes Neuro:  CN II-XII intact, strength 4/5 in RUE, RLE, strength 4/5 LUE, LLE; sensation intact bilateral; no dysmetria; babinski equivocal   Data Reviewed: Data reviewed in history  Assessment and Plan: * Lobar pneumonia (HCC)- (present on admission) Start ceftriaxone and azithromycin Check PCT Check lactic acid  Acute respiratory failure with hypoxia (HCC) Patient had shortness of breath with oxygen saturation 90% Stable on 2 L presently Wean oxygen for saturation greater than 92%  Displaced fracture of distal phalanx of left great toe, initial encounter for closed fracture Noted on xray foot Cam boot placed  Metastatic breast cancer ([place]) Follows with Dr. [name] Per last visit on 09/11/21--all treatment has been stopped due to liver cirrhosis and bleeding Plan was to transition to palliative/hospice care  Cirrhosis of liver with ascites (HCC) Continue lactulose  Essential hypertension Holding metoprolol titrate secondary to soft blood pressures  Hyponatremia Secondary to volume depletion and poor  solute intake Given 2 L normal saline in the ED A.m. BMP Hold furosemide Liberalize diet  Chronic kidney disease, stage 3b (HCC) Baseline creatinine 1.2-1.4 Monitor with serial BMP  Paroxysmal atrial fibrillation (HCC) Currently in sinus rhythm Not a candidate for anticoagulation secondary to GI bleed and liver cirrhosis Holding metoprolol secondary to soft blood pressures       Advance Care Planning: FULL  Consults: palliative  Family Communication: spouse updated 2/26  Severity of Illness: The appropriate patient status for this patient is OBSERVATION. Observation status is judged to be reasonable and necessary in order to provide the required intensity of service to ensure the patient's safety. The patient's presenting symptoms, physical exam findings, and initial radiographic and  laboratory data in the context of their medical condition is felt to place them at decreased risk for further clinical deterioration. Furthermore, it is anticipated that the patient will be medically stable for discharge from the hospital within 2 midnights of admission.   Author: Orson Eva, MD 12/23/2021 10:10 AM  For on call review [email or web address].

## 2021-12-23 NOTE — Progress Notes (Signed)
Pt's SNF auth for [facility] rehab was approved.  Arlie Solomons.Lillian Tigges, MSW, [place]   Transitions of Care Clinical Social Worker I Direct Dial: [phone]   Fax: [phone] [email or web address]

## 2021-12-23 NOTE — Assessment & Plan Note (Signed)
Baseline creatinine 1.2-1.4 Monitor with serial BMP

## 2021-12-24 ENCOUNTER — Encounter (HOSPITAL_COMMUNITY): Payer: Self-pay | Admitting: Internal Medicine

## 2021-12-24 ENCOUNTER — Inpatient Hospital Stay (HOSPITAL_COMMUNITY): Payer: Medicare Other

## 2021-12-24 DIAGNOSIS — J181 Lobar pneumonia, unspecified organism: Principal | ICD-10-CM

## 2021-12-24 DIAGNOSIS — Z806 Family history of leukemia: Secondary | ICD-10-CM | POA: Diagnosis not present

## 2021-12-24 DIAGNOSIS — E871 Hypo-osmolality and hyponatremia: Secondary | ICD-10-CM | POA: Diagnosis present

## 2021-12-24 DIAGNOSIS — W010XXA Fall on same level from slipping, tripping and stumbling without subsequent striking against object, initial encounter: Secondary | ICD-10-CM | POA: Diagnosis present

## 2021-12-24 DIAGNOSIS — Z20822 Contact with and (suspected) exposure to covid-19: Secondary | ICD-10-CM | POA: Diagnosis present

## 2021-12-24 DIAGNOSIS — Z7189 Other specified counseling: Secondary | ICD-10-CM | POA: Diagnosis not present

## 2021-12-24 DIAGNOSIS — I480 Paroxysmal atrial fibrillation: Secondary | ICD-10-CM

## 2021-12-24 DIAGNOSIS — Z833 Family history of diabetes mellitus: Secondary | ICD-10-CM | POA: Diagnosis not present

## 2021-12-24 DIAGNOSIS — C50911 Malignant neoplasm of unspecified site of right female breast: Secondary | ICD-10-CM | POA: Diagnosis present

## 2021-12-24 DIAGNOSIS — E785 Hyperlipidemia, unspecified: Secondary | ICD-10-CM | POA: Diagnosis present

## 2021-12-24 DIAGNOSIS — C50919 Malignant neoplasm of unspecified site of unspecified female breast: Secondary | ICD-10-CM | POA: Diagnosis not present

## 2021-12-24 DIAGNOSIS — Z825 Family history of asthma and other chronic lower respiratory diseases: Secondary | ICD-10-CM | POA: Diagnosis not present

## 2021-12-24 DIAGNOSIS — Z515 Encounter for palliative care: Secondary | ICD-10-CM | POA: Diagnosis not present

## 2021-12-24 DIAGNOSIS — Z8249 Family history of ischemic heart disease and other diseases of the circulatory system: Secondary | ICD-10-CM | POA: Diagnosis not present

## 2021-12-24 DIAGNOSIS — K7460 Unspecified cirrhosis of liver: Secondary | ICD-10-CM | POA: Diagnosis present

## 2021-12-24 DIAGNOSIS — S92405A Nondisplaced unspecified fracture of left great toe, initial encounter for closed fracture: Secondary | ICD-10-CM | POA: Diagnosis present

## 2021-12-24 DIAGNOSIS — E869 Volume depletion, unspecified: Secondary | ICD-10-CM | POA: Diagnosis present

## 2021-12-24 DIAGNOSIS — J9601 Acute respiratory failure with hypoxia: Secondary | ICD-10-CM | POA: Diagnosis present

## 2021-12-24 DIAGNOSIS — N1832 Chronic kidney disease, stage 3b: Secondary | ICD-10-CM | POA: Diagnosis present

## 2021-12-24 DIAGNOSIS — C7800 Secondary malignant neoplasm of unspecified lung: Secondary | ICD-10-CM | POA: Diagnosis present

## 2021-12-24 DIAGNOSIS — I10 Essential (primary) hypertension: Secondary | ICD-10-CM | POA: Diagnosis not present

## 2021-12-24 DIAGNOSIS — R188 Other ascites: Secondary | ICD-10-CM | POA: Diagnosis present

## 2021-12-24 DIAGNOSIS — Y92009 Unspecified place in unspecified non-institutional (private) residence as the place of occurrence of the external cause: Secondary | ICD-10-CM | POA: Diagnosis not present

## 2021-12-24 DIAGNOSIS — Z880 Allergy status to penicillin: Secondary | ICD-10-CM | POA: Diagnosis not present

## 2021-12-24 DIAGNOSIS — K2100 Gastro-esophageal reflux disease with esophagitis, without bleeding: Secondary | ICD-10-CM | POA: Diagnosis present

## 2021-12-24 DIAGNOSIS — Z9842 Cataract extraction status, left eye: Secondary | ICD-10-CM | POA: Diagnosis not present

## 2021-12-24 DIAGNOSIS — J90 Pleural effusion, not elsewhere classified: Secondary | ICD-10-CM | POA: Diagnosis present

## 2021-12-24 DIAGNOSIS — I129 Hypertensive chronic kidney disease with stage 1 through stage 4 chronic kidney disease, or unspecified chronic kidney disease: Secondary | ICD-10-CM | POA: Diagnosis present

## 2021-12-24 LAB — CBC
HCT: 36.4 % (ref 36.0–46.0)
Hemoglobin: 11.9 g/dL — ABNORMAL LOW (ref 12.0–15.0)
MCH: 29.1 pg (ref 26.0–34.0)
MCHC: 32.7 g/dL (ref 30.0–36.0)
MCV: 89 fL (ref 80.0–100.0)
Platelets: 212 10*3/uL (ref 150–400)
RBC: 4.09 MIL/uL (ref 3.87–5.11)
RDW: 16.1 % — ABNORMAL HIGH (ref 11.5–15.5)
WBC: 10.1 10*3/uL (ref 4.0–10.5)
nRBC: 0 % (ref 0.0–0.2)

## 2021-12-24 LAB — URINE CULTURE: Culture: >=100000 — AB

## 2021-12-24 LAB — BASIC METABOLIC PANEL
Anion gap: 10 (ref 5–15)
BUN: 30 mg/dL — ABNORMAL HIGH (ref 8–23)
CO2: 17 mmol/L — ABNORMAL LOW (ref 22–32)
Calcium: 9.1 mg/dL (ref 8.9–10.3)
Chloride: 100 mmol/L (ref 98–111)
Creatinine, Ser: 1.27 mg/dL — ABNORMAL HIGH (ref 0.44–1.00)
GFR, Estimated: 40 mL/min — ABNORMAL LOW (ref >60)
Glucose, Bld: 125 mg/dL — ABNORMAL HIGH (ref 70–99)
Potassium: 3.9 mmol/L (ref 3.5–5.1)
Sodium: 127 mmol/L — ABNORMAL LOW (ref 135–145)

## 2021-12-24 LAB — MAGNESIUM: Magnesium: 1.6 mg/dL — ABNORMAL LOW (ref 1.7–2.4)

## 2021-12-24 MED ORDER — MAGNESIUM SULFATE 2 GM/50ML IV SOLN
2.0000 g | Freq: Once | INTRAVENOUS | Status: AC
  Administered 2021-12-24: 2 g via INTRAVENOUS
  Filled 2021-12-24: qty 50

## 2021-12-24 MED ORDER — METOPROLOL TARTRATE 25 MG PO TABS
12.5000 mg | ORAL_TABLET | Freq: Two times a day (BID) | ORAL | Status: DC
  Administered 2021-12-24 – 2021-12-25 (×3): 12.5 mg via ORAL
  Filled 2021-12-24 (×3): qty 1

## 2021-12-24 MED ORDER — IOHEXOL 350 MG/ML SOLN
75.0000 mL | Freq: Once | INTRAVENOUS | Status: AC | PRN
  Administered 2021-12-24: 75 mL via INTRAVENOUS

## 2021-12-24 MED ORDER — SODIUM CHLORIDE 0.9 % IV SOLN: INTRAVENOUS | Status: AC

## 2021-12-24 NOTE — Consult Note (Signed)
[facility] Consultation Oncology  Name: TAKYA VANDIVIER      MRN: [id number]    Location: A316/A316-01  Date: 12/24/2021 Time:5:42 PM   REFERRING PHYSICIAN: Dr. Carles Collet  REASON FOR CONSULT: Metastatic breast cancer    HISTORY OF PRESENT ILLNESS: Mrs. Bullard is a 88 year old very pleasant white female known to me from office visits.  She had a metastatic HER2 positive breast cancer for which we stopped active treatments because of her cirrhosis and decreasing performance status.  She apparently fell at home and broke her toe on the left foot and was admitted with cough and pneumonia symptoms.  A CT scan of the chest PE protocol was done today was negative but showed worsening metastatic disease in the lungs and lymph nodes.  Patient's husband and daughter at bedside.  PAST MEDICAL HISTORY:   Past Medical History:  Diagnosis Date   Aortic stenosis    Cancer ([place]) 06/2018   right breast cancer   Cirrhosis ([place])    GERD (gastroesophageal reflux disease)    occasional    Hyperlipidemia    Hypertension    clearance with note Dr Nevada Crane on chart   Pneumonia    2 years ago/ states occ cough with sinus drainage- no fever   Thyroid nodule    with biopsy- states following medically    ALLERGIES: Allergies  Allergen Reactions   Penicillins Rash    Has patient had a PCN reaction causing immediate rash, facial/tongue/throat swelling, SOB or lightheadedness with hypotension: No Has patient had a PCN reaction causing severe rash involving mucus membranes or skin necrosis: No Has patient had a PCN reaction that required hospitalization: No Has patient had a PCN reaction occurring within the last 10 years: No If all of the above answers are "NO", then may proceed with Cephalosporin use.       MEDICATIONS: I have reviewed the patient's current medications.     PAST SURGICAL HISTORY Past Surgical History:  Procedure Laterality Date   CATARACT EXTRACTION W/PHACO  11/30/2012   Procedure:  CATARACT EXTRACTION PHACO AND INTRAOCULAR LENS PLACEMENT ([place]);  Surgeon: Williams Che, MD;  Location: [facility];  Service: Ophthalmology;  Laterality: Left;  CDE:11.49   CATARACT EXTRACTION W/PHACO Right 03/15/2013   Procedure: CATARACT EXTRACTION PHACO AND INTRAOCULAR LENS PLACEMENT ([facility]);  Surgeon: Williams Che, MD;  Location: [facility];  Service: Ophthalmology;  Laterality: Right;  CDE 16.15   COLONOSCOPY     ESOPHAGOGASTRODUODENOSCOPY (EGD) WITH PROPOFOL N/A 10/07/2020   Dr. 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  Distal esophageal erosion/excoriations overlying varices.  More consistent with trauma/reflux than variceal bleeding stigmata.  Stomach incompletely seen due to presence of old blood.  Moderate sized hiatal hernia.  4 mm antral erosion, innocent appearing.  Repeat EGD in 2 days.   ESOPHAGOGASTRODUODENOSCOPY (EGD) WITH PROPOFOL N/A 10/11/2020   Dr. Laural Golden: Grade 1 varices in the distal esophagus.  LA grade B esophagitis, 1 benign-appearing intrinsic mild stenosis at 34 cm from the incisor, 4 cm hiatal hernia, single erosion in the gastric antrum and prepyloric region.   EXCISION OF SKIN TAG Right 06/17/2016   Procedure: SHAVE OF SKIN TAG RIGHT BUTTOCK;  Surgeon: Aviva Signs, MD;  Location: [facility];  Service: General;  Laterality: Right;   JOINT REPLACEMENT     left knee   MASS EXCISION Left 06/17/2016   Procedure: EXCISION SKIN MALIGNANT LESION LEFT BUTTOCK;  Surgeon: Aviva Signs, MD;  Location: [facility];  Service: General;  Laterality: Left;   PORTACATH PLACEMENT Right 07/28/2018   Procedure: INSERTION PORT-A-CATH WITH ULTRASOUND;  Surgeon: Rolm Bookbinder, MD;  Location: [place];  Service: General;  Laterality: Right;   PUNCH BIOPSY OF SKIN Right 07/28/2018   Procedure: PUNCH BIOPSY OF SKIN RIGHT BREAST;  Surgeon: Rolm Bookbinder, MD;  Location: [place];  Service: General;  Laterality: Right;   TOTAL  KNEE ARTHROPLASTY  12/16/2011   Procedure: TOTAL KNEE ARTHROPLASTY;  Surgeon: Gearlean Alf, MD;  Location: [facility];  Service: Orthopedics;  Laterality: Right;   TUBAL LIGATION      FAMILY HISTORY: Family History  Problem Relation Age of Onset   Heart attack Mother    Bronchitis Father    Diabetes Sister    Leukemia Brother    Lung disease Brother    Lung disease Sister     SOCIAL HISTORY:  reports that she has never smoked. She has never used smokeless tobacco. She reports that she does not drink alcohol and does not use drugs.  PERFORMANCE STATUS: The patient's performance status is 3 - Symptomatic, >50% confined to bed  PHYSICAL EXAM: Most Recent Vital Signs: Blood pressure (!) 154/73, pulse 98, temperature (!) 97.5 F (36.4 C), resp. rate 18, height 5' 4" (1.626 m), weight 178 lb 9.2 oz (81 kg), SpO2 100 %. BP (!) 154/73 (BP Location: Left Arm)    Pulse 98    Temp (!) 97.5 F (36.4 C)    Resp 18    Ht 5' 4" (1.626 m)    Wt 178 lb 9.2 oz (81 kg)    SpO2 100%    BMI 30.65 kg/m  General appearance: alert, cooperative, and appears stated age Neurologic: Grossly normal  LABORATORY DATA:  Results for orders placed or performed during the hospital encounter of 12/20/21 (from the past 48 hour(s))  Resp Panel by RT-PCR (Flu A&B, Covid) Nasopharyngeal Swab     Status: None   Collection Time: 12/23/21  5:25 AM   Specimen: Nasopharyngeal Swab; Nasopharyngeal(NP) swabs in vial transport medium  Result Value Ref Range   SARS Coronavirus 2 by RT PCR NEGATIVE NEGATIVE    Comment: (NOTE) SARS-CoV-2 target nucleic acids are NOT DETECTED.  The SARS-CoV-2 RNA is generally detectable in upper respiratory specimens during the acute phase of infection. The lowest concentration of SARS-CoV-2 viral copies this assay can detect is 138 copies/mL. A negative result does not preclude SARS-Cov-2 infection and should not be used as the sole basis for treatment or other patient management  decisions. A negative result may occur with  improper specimen collection/handling, submission of specimen other than nasopharyngeal swab, presence of viral mutation(s) within the areas targeted by this assay, and inadequate number of viral copies(<138 copies/mL). A negative result must be combined with clinical observations, patient history, and epidemiological information. The expected result is Negative.  Fact Sheet for Patients:  EntrepreneurPulse.com.au  Fact Sheet for Healthcare Providers:  IncredibleEmployment.be  This test is no t yet approved or cleared by the [place] FDA and  has been authorized for detection and/or diagnosis of SARS-CoV-2 by FDA under an Emergency Use Authorization (EUA). This EUA will remain  in effect (meaning this test can be used) for the duration of the COVID-19 declaration under Section 564(b)(1) of the Act, 21 U.S.C.section 360bbb-3(b)(1), unless the authorization is terminated  or revoked sooner.       Influenza A by PCR NEGATIVE NEGATIVE   Influenza B by PCR NEGATIVE  NEGATIVE    Comment: (NOTE) The Xpert Xpress SARS-CoV-2/FLU/RSV plus assay is intended as an aid in the diagnosis of influenza from Nasopharyngeal swab specimens and should not be used as a sole basis for treatment. Nasal washings and aspirates are unacceptable for Xpert Xpress SARS-CoV-2/FLU/RSV testing.  Fact Sheet for Patients: EntrepreneurPulse.com.au  Fact Sheet for Healthcare Providers: IncredibleEmployment.be  This test is not yet approved or cleared by the [place] FDA and has been authorized for detection and/or diagnosis of SARS-CoV-2 by FDA under an Emergency Use Authorization (EUA). This EUA will remain in effect (meaning this test can be used) for the duration of the COVID-19 declaration under Section 564(b)(1) of the Act, 21 U.S.C. section 360bbb-3(b)(1), unless the authorization  is terminated or revoked.  Performed at [facility], [address], [place], [place] [address]   CBC with Differential/Platelet     Status: Abnormal   Collection Time: 12/23/21  6:21 AM  Result Value Ref Range   WBC 10.0 4.0 - 10.5 K/uL   RBC 3.92 3.87 - 5.11 MIL/uL   Hemoglobin 11.4 (L) 12.0 - 15.0 g/dL   HCT 36.3 36.0 - 46.0 %   MCV 92.6 80.0 - 100.0 fL   MCH 29.1 26.0 - 34.0 pg   MCHC 31.4 30.0 - 36.0 g/dL   RDW 16.5 (H) 11.5 - 15.5 %   Platelets 191 150 - 400 K/uL   nRBC 0.0 0.0 - 0.2 %   Neutrophils Relative % 70 %   Neutro Abs 7.1 1.7 - 7.7 K/uL   Lymphocytes Relative 15 %   Lymphs Abs 1.5 0.7 - 4.0 K/uL   Monocytes Relative 12 %   Monocytes Absolute 1.2 (H) 0.1 - 1.0 K/uL   Eosinophils Relative 2 %   Eosinophils Absolute 0.2 0.0 - 0.5 K/uL   Basophils Relative 1 %   Basophils Absolute 0.1 0.0 - 0.1 K/uL   Immature Granulocytes 0 %   Abs Immature Granulocytes 0.03 0.00 - 0.07 K/uL    Comment: Performed at [facility], [address], [place], [place] [address]  Basic metabolic panel     Status: Abnormal   Collection Time: 12/23/21  6:21 AM  Result Value Ref Range   Sodium 125 (L) 135 - 145 mmol/L   Potassium 4.5 3.5 - 5.1 mmol/L   Chloride 96 (L) 98 - 111 mmol/L   CO2 19 (L) 22 - 32 mmol/L   Glucose, Bld 82 70 - 99 mg/dL    Comment: Glucose reference range applies only to samples taken after fasting for at least 8 hours.   BUN 37 (H) 8 - 23 mg/dL   Creatinine, Ser 1.39 (H) 0.44 - 1.00 mg/dL   Calcium 8.9 8.9 - 10.3 mg/dL   GFR, Estimated 36 (L) >60 mL/min    Comment: (NOTE) Calculated using the CKD-EPI Creatinine Equation (2021)    Anion gap 10 5 - 15    Comment: Performed at [facility], [address] [place], [place] [address]  Procalcitonin - Baseline     Status: None   Collection Time: 12/23/21 12:46 PM  Result Value Ref Range   Procalcitonin 0.41 ng/mL    Comment:        Interpretation: PCT (Procalcitonin) <= 0.5 ng/mL: Systemic infection (sepsis)  is not likely. Local bacterial infection is possible. (NOTE)       Sepsis PCT Algorithm           Lower Respiratory Tract  Infection PCT Algorithm    ----------------------------     ----------------------------         PCT < 0.25 ng/mL                PCT < 0.10 ng/mL          Strongly encourage             Strongly discourage   discontinuation of antibiotics    initiation of antibiotics    ----------------------------     -----------------------------       PCT 0.25 - 0.50 ng/mL            PCT 0.10 - 0.25 ng/mL               OR       >80% decrease in PCT            Discourage initiation of                                            antibiotics      Encourage discontinuation           of antibiotics    ----------------------------     -----------------------------         PCT >= 0.50 ng/mL              PCT 0.26 - 0.50 ng/mL               AND        <80% decrease in PCT             Encourage initiation of                                             antibiotics       Encourage continuation           of antibiotics    ----------------------------     -----------------------------        PCT >= 0.50 ng/mL                  PCT > 0.50 ng/mL               AND         increase in PCT                  Strongly encourage                                      initiation of antibiotics    Strongly encourage escalation           of antibiotics                                     -----------------------------                                           PCT <= 0.25 ng/mL  OR                                        > 80% decrease in PCT                                      Discontinue / Do not initiate                                             antibiotics  Performed at [facility], [address], [place], [place] [address]   Lactic acid, plasma     Status: None   Collection Time: 12/23/21 12:46 PM  Result  Value Ref Range   Lactic Acid, Venous 1.1 0.5 - 1.9 mmol/L    Comment: Performed at [facility], [address] [place], [place] [address]  Brain natriuretic peptide     Status: Abnormal   Collection Time: 12/23/21 12:46 PM  Result Value Ref Range   B Natriuretic Peptide 312.0 (H) 0.0 - 100.0 pg/mL    Comment: Performed at [facility], [address] [place], [place] [address]  Basic metabolic panel     Status: Abnormal   Collection Time: 12/24/21  5:03 AM  Result Value Ref Range   Sodium 127 (L) 135 - 145 mmol/L   Potassium 3.9 3.5 - 5.1 mmol/L   Chloride 100 98 - 111 mmol/L   CO2 17 (L) 22 - 32 mmol/L   Glucose, Bld 125 (H) 70 - 99 mg/dL    Comment: Glucose reference range applies only to samples taken after fasting for at least 8 hours.   BUN 30 (H) 8 - 23 mg/dL   Creatinine, Ser 1.27 (H) 0.44 - 1.00 mg/dL   Calcium 9.1 8.9 - 10.3 mg/dL   GFR, Estimated 40 (L) >60 mL/min    Comment: (NOTE) Calculated using the CKD-EPI Creatinine Equation (2021)    Anion gap 10 5 - 15    Comment: Performed at [facility], [address], [place], [place] [address]  CBC     Status: Abnormal   Collection Time: 12/24/21  5:03 AM  Result Value Ref Range   WBC 10.1 4.0 - 10.5 K/uL   RBC 4.09 3.87 - 5.11 MIL/uL   Hemoglobin 11.9 (L) 12.0 - 15.0 g/dL   HCT 36.4 36.0 - 46.0 %   MCV 89.0 80.0 - 100.0 fL   MCH 29.1 26.0 - 34.0 pg   MCHC 32.7 30.0 - 36.0 g/dL   RDW 16.1 (H) 11.5 - 15.5 %   Platelets 212 150 - 400 K/uL   nRBC 0.0 0.0 - 0.2 %    Comment: Performed at [facility], [address] [place], [place] [address]  Magnesium     Status: Abnormal   Collection Time: 12/24/21  5:03 AM  Result Value Ref Range   Magnesium 1.6 (L) 1.7 - 2.4 mg/dL    Comment: Performed at [facility], [address] [place], [place] [address]      RADIOGRAPHY: CT Angio Chest Pulmonary Embolism (PE) W or WO Contrast  Result Date: 12/24/2021 CLINICAL DATA:  Chest pain, shortness of breath EXAM: CT ANGIOGRAPHY  CHEST WITH CONTRAST TECHNIQUE: Multidetector CT imaging  of the chest was performed using the standard protocol during bolus administration of intravenous contrast. Multiplanar CT image reconstructions and MIPs were obtained to evaluate the vascular anatomy. RADIATION DOSE REDUCTION: This exam was performed according to the departmental dose-optimization program which includes automated exposure control, adjustment of the mA and/or kV according to patient size and/or use of iterative reconstruction technique. CONTRAST:  68m OMNIPAQUE IOHEXOL 350 MG/ML SOLN COMPARISON:  09/05/2021 FINDINGS: Cardiovascular: There is homogeneous enhancement in thoracic aorta. Coronary artery calcifications are seen. There is ectasia of main pulmonary artery measuring 3.7 cm suggesting pulmonary arterial hypertension. There are no intraluminal filling defects in central pulmonary artery branches. Evaluation of small peripheral branches, especially in the lower lung fields is limited by motion artifacts and infiltrates. Mediastinum/Nodes: There are enlarged lymph nodes in the mediastinum largest in the precarinal region measuring approximately 2.9 x 2 cm. There are enlarged lymph nodes in both hilar regions, more so on the right side. Largest of the nodes in the right hilum measures proximally 3.6 x 3 cm. There is interval increase in size and number of enlarged lymph nodes in mediastinum and hilar regions. Lungs/Pleura: There are scattered nodular densities in both lungs largest in the left lower lobe measuring approximately 2.6 cm. There is interval increase in number and size of metastatic lesions in the lung fields. There is new linear nodular density in the medial left upper lung fields, possibly pulmonary nodules or pleural nodules from metastatic disease. Small to moderate bilateral pleural effusions are seen, more so on the right side. This finding was not seen in the previous study. There are patchy infiltrates in the lower lung  fields, possibly due to atelectasis, more so on the right side. Upper Abdomen: There is fatty infiltration in the liver. Small hiatal hernia is seen. Musculoskeletal: There is 4.9 x 4.7 cm mass in the right breast which measured 4 cm in maximum diameter in the previous study suggesting possible interval progression of primary malignant neoplasm in the right breast. Review of the MIP images confirms the above findings. IMPRESSION: There is no evidence of central pulmonary artery embolism. Evaluation of small peripheral pulmonary artery branches is limited by motion artifacts and infiltrates. There is ectasia of main pulmonary artery suggesting pulmonary arterial hypertension. There is no evidence of thoracic aortic dissection. There are scattered coronary artery calcifications. There are abnormally enlarged lymph nodes in mediastinum, both hilar regions and left axilla with interval increase in size and number suggesting progression of metastatic lymphadenopathy. There are scattered nodules of varying sizes in both lungs with interval increase in number and size suggesting progression of pulmonary metastatic disease. There is interval appearance of small to moderate bilateral pleural effusions, more so on the right side suggesting possible pleural metastatic disease. There is interval increase in size of soft tissue mass in the right breast possibly suggesting progression of primary neoplasm in the right breast. Electronically Signed   By: PElmer PickerM.D.   On: 12/24/2021 13:01   DG Chest Port 1 View  Result Date: 12/23/2021 CLINICAL DATA:  88year old female with history of cough. EXAM: PORTABLE CHEST 1 VIEW COMPARISON:  Chest x-ray 10/31/2021. FINDINGS: Right internal jugular single-lumen porta cath with tip terminating in the mid superior vena cava. Lung volumes are normal. Patchy ill-defined opacities and areas of interstitial prominence are noted throughout the right mid to lower lung and the left  lung base. Blunting of the costophrenic sulci bilaterally, indicative of small bilateral pleural effusions. No pneumothorax. Mild cardiomegaly.  Upper mediastinal contours are distorted by patient's rotation to the right. IMPRESSION: 1. Findings are concerning for multilobar bilateral bronchopneumonia, most evident throughout the right mid to lower lung and left lung base. 2. Small bilateral pleural effusions. 3. Mild cardiomegaly. Electronically Signed   By: Vinnie Langton M.D.   On: 12/23/2021 05:04        ASSESSMENT and PLAN:  1.  Metastatic HER2 positive right breast cancer: - I have reviewed images and results of the CT scan with the patient, her husband and her daughter. - CT scan showed worsening lung nodules, adenopathy and breast mass. - I had a prolonged discussion about palliative/hospice care.  Husband and her daughter understand that she is not a candidate for any active therapy. - Patient's husband would want her to go home with hospice.  At the same time he is undergoing cardiac rehab as he had CABG.  He is willing to send her to a rehab facility briefly until the fracture heals and then take her home with hospice. - I will discuss with Dr. Carles Collet.  All questions were answered. The patient knows to call the clinic with any problems, questions or concerns. We can certainly see the patient much sooner if necessary.    Derek Jack

## 2021-12-24 NOTE — Progress Notes (Signed)
Pt lying in bed with eyes open. Nasal canula adjusted, and pt pulled up in bed. ADL'S addressed.

## 2021-12-24 NOTE — TOC Progression Note (Signed)
Transition of Care [facility]) - Progression Note    Patient Details  Name: [name] MRN: [id number] Date of Birth: October 11, 1930  Transition of Care [facility]) CM/SW Contact  Salome Arnt, [place] Phone Number: 12/24/2021, 10:20 AM  Clinical Narrative:  Per MD, possible d/c tomorrow to SNF. Authorization approved and still good tomorrow per Ebony Hail at [facility]. TOC will continue to follow.           Expected Discharge Plan and Services                                                 Social Determinants of Health (SDOH) Interventions    Readmission Risk Interventions No flowsheet data found.

## 2021-12-24 NOTE — [place] FL2 (Signed)
[place] MEDICAID FL2 LEVEL OF CARE SCREENING TOOL     IDENTIFICATION  Patient Name: [name] Birthdate: 25-Aug-1930 Sex: female Admission Date (Current Location): 12/20/2021  [facility] [place] Number:  [employer] and Address:  [facility] [address] [place]      Provider Number: [id number]  Attending Physician Name and Address:  Orson Eva, MD  Relative Name and Phone Number:  Shawnya, Mayor [phone]    Current Level of Care: Hospital Recommended Level of Care: [place] Prior Approval Number:    Date Approved/Denied:   PASRR Number: [id number]  Discharge Plan: SNF    Current Diagnoses: Patient Active Problem List   Diagnosis Date Noted   Lobar pneumonia ([place]) 12/23/2021   Acute respiratory failure with hypoxia ([place]) 12/23/2021   Chronic kidney disease, stage 3b ([place]) 12/23/2021   Hyponatremia 12/23/2021   Essential hypertension 12/23/2021   Cirrhosis of liver with ascites ([place]) 12/23/2021   Metastatic breast cancer ([place]) 12/23/2021   Displaced fracture of distal phalanx of left great toe, initial encounter for closed fracture 12/23/2021   New onset atrial fibrillation ([place]) 08/01/2021   Paroxysmal atrial fibrillation ([place]) 08/01/2021   Hepatic cirrhosis (HCC)    Encephalopathy, hepatic 10/08/2020   Aortic valvar stenosis 10/08/2020   AKI (acute kidney injury) ([place]) 10/07/2020   Near syncope 10/07/2020   Moderate protein-calorie malnutrition ([place]) 10/07/2020   Malignant neoplasm of upper-outer quadrant of right breast in female, estrogen receptor negative ([place]) 07/15/2018   Peripheral polyneuropathy 12/12/2017   Symptomatic anemia 12/19/2011   OA (osteoarthritis) of knee 12/16/2011   KNEE, ARTHRITIS, DEGEN./OSTEO 10/10/2010   TEAR LATERAL MENISCUS 10/10/2010    Orientation RESPIRATION BLADDER Height & Weight     Self, Time, Situation, Place  Normal Continent Weight: 178 lb 9.2 oz (81  kg) Height:  5\' 4"  (162.6 cm)  BEHAVIORAL SYMPTOMS/MOOD NEUROLOGICAL BOWEL NUTRITION STATUS      Continent Diet (regular)  AMBULATORY STATUS COMMUNICATION OF NEEDS Skin   Limited Assist Verbally Normal                       Personal Care Assistance Level of Assistance  Bathing, Feeding, Dressing Bathing Assistance: Limited assistance Feeding assistance: Independent Dressing Assistance: Limited assistance     Functional Limitations Info  Sight, Hearing, Speech Sight Info: Adequate Hearing Info: Adequate Speech Info: Adequate    SPECIAL CARE FACTORS FREQUENCY  PT (By licensed PT), OT (By licensed OT)     PT Frequency: 5 x a week OT Frequency: 5 x a week            Contractures Contractures Info: Not present    Additional Factors Info  Code Status, Allergies Code Status Info: full Allergies Info: Penicillins           Current Medications (12/24/2021):  This is the current hospital active medication list Current Facility-Administered Medications  Medication Dose Route Frequency Provider Last Rate Last Admin   acetaminophen (TYLENOL) tablet 650 mg  650 mg Oral Q6H PRN Tat, David, MD       Or   acetaminophen (TYLENOL) suppository 650 mg  650 mg Rectal Q6H PRN Tat, Shanon Brow, MD       aspirin EC tablet 81 mg  81 mg Oral Daily Daleen Bo, MD   81 mg at 12/23/21 1046   azithromycin (ZITHROMAX) tablet 500 mg  500 mg Oral Daily Tat, Shanon Brow, MD  cefTRIAXone (ROCEPHIN) 1 g in sodium chloride 0.9 % 100 mL IVPB  1 g Intravenous Q24H Tat, Shanon Brow, MD 200 mL/hr at 12/24/21 0542 1 g at 12/24/21 0542   cholecalciferol (VITAMIN D3) tablet 1,000 Units  1,000 Units Oral Daily Tat, David, MD   1,000 Units at 12/23/21 1311   ferrous sulfate tablet 325 mg  325 mg Oral Q breakfast Daleen Bo, MD   325 mg at 12/23/21 7672   heparin injection 5,000 Units  5,000 Units Subcutaneous Franco Collet, MD   5,000 Units at 12/24/21 0543   lactulose (CHRONULAC) 10 GM/15ML solution 20 g   20 g Oral BID Daleen Bo, MD   20 g at 12/23/21 1046   multivitamin with minerals tablet 1 tablet  1 tablet Oral Daily Tat, David, MD   1 tablet at 12/23/21 1311   ondansetron (ZOFRAN) tablet 4 mg  4 mg Oral Q6H PRN Tat, Shanon Brow, MD       Or   ondansetron (ZOFRAN) injection 4 mg  4 mg Intravenous Q6H PRN Tat, Shanon Brow, MD       pantoprazole (PROTONIX) EC tablet 40 mg  40 mg Oral BID AC Daleen Bo, MD   40 mg at 12/23/21 1735   traMADol (ULTRAM) tablet 50 mg  50 mg Oral Q6H PRN Daleen Bo, MD   50 mg at 12/22/21 1734   Facility-Administered Medications Ordered in Other Encounters  Medication Dose Route Frequency Provider Last Rate Last Admin   sodium chloride flush (NS) 0.9 % injection 10 mL  10 mL Intravenous PRN Derek Jack, MD   10 mL at 05/23/20 1025   sodium chloride flush (NS) 0.9 % injection 10 mL  10 mL Intravenous PRN Derek Jack, MD   10 mL at 05/23/20 0900     Discharge Medications: Please see discharge summary for a list of discharge medications.  Relevant Imaging Results:  Relevant Lab Results:   Additional Information SSN [id number]  Salome Arnt, [place]

## 2021-12-24 NOTE — Assessment & Plan Note (Signed)
Palliative consulted -had family meeting 2/27>>continue full scope of care -consult Dr. [name] interested in chemo

## 2021-12-24 NOTE — Assessment & Plan Note (Signed)
Replete

## 2021-12-24 NOTE — Progress Notes (Signed)
Physical Therapy Treatment Patient Details Name: [name] MRN: [id number] DOB: 1929/12/22 Today's Date: 12/24/2021   History of Present Illness [name] is a 88 y.o. year-old female with a history of cirrhosis, breast cancer presenting to the ED with chief complaint of weakness.     Patient has had generalized weakness particularly in her lower extremities for 1 or 2 years.  Walks with a walker.  Sometimes she has events where she gets her legs in an awkward position and cannot move them.  Same thing happened this afternoon causing her to stumble and fall.  No head trauma, no loss of consciousness, no chest pain or shortness of breath, no abdominal pain, isolated pain to the left great toe.    PT Comments    Patient demonstrates slow labored movement for sitting up at bedside, requires repeated verbal/tactile cueing mostly due to very HOH, limited to a few slow labored side steps, unsafe to take steps away from bed side due to fall risk, BLE weakness and has to lean over RW with flexed trunk to maintain standing balance.  Patient on room air throughout treatment with SpO2 at 93%, HR increased to 125 BPM with exertion and patient tolerated sitting up in chair after therapy with her daughter present in room.  Patient will benefit from continued skilled physical therapy in hospital and recommended venue below to increase strength, balance, endurance for safe ADLs and gait.     Recommendations for follow up therapy are one component of a multi-disciplinary discharge planning process, led by the attending physician.  Recommendations may be updated based on patient status, additional functional criteria and insurance authorization.  Follow Up Recommendations  Skilled nursing-short term rehab (<3 hours/day)     Assistance Recommended at Discharge    Patient can return home with the following A lot of help with walking and/or transfers;A lot of help with bathing/dressing/bathroom;Assistance with  cooking/housework;Assist for transportation;Help with stairs or ramp for entrance   Equipment Recommendations  None recommended by PT    Recommendations for Other Services       Precautions / Restrictions Precautions Precautions: Fall Required Braces or Orthoses: Other Brace Other Brace: CAM boot left foot Restrictions Weight Bearing Restrictions: Yes LLE Weight Bearing: Weight bearing as tolerated     Mobility  Bed Mobility Overal bed mobility: Needs Assistance Bed Mobility: Supine to Sit     Supine to sit: Mod assist     General bed mobility comments: increased time, labored movement    Transfers Overall transfer level: Needs assistance Equipment used: Rolling walker (2 wheels) Transfers: Sit to/from Stand, Bed to chair/wheelchair/BSC Sit to Stand: Mod assist   Step pivot transfers: Mod assist       General transfer comment: unsteady labored movement with diffiuclty advancing LLE    Ambulation/Gait Ambulation/Gait assistance: Mod assist, Max assist Gait Distance (Feet): 4 Feet Assistive device: Rolling walker (2 wheels) Gait Pattern/deviations: Decreased step length - right, Decreased step length - left, Decreased stance time - left, Decreased stride length, Decreased weight shift to left, Shuffle, Antalgic, Trunk flexed Gait velocity: decreased     General Gait Details: limited to few slow labored side steps at bedside with frequent sitting rest breaks due to fatigue and BLE weakness, mild increase LLE great toe pain   Stairs             Wheelchair Mobility    Modified Rankin (Stroke Patients Only)       Balance Overall balance assessment: Needs  assistance Sitting-balance support: No upper extremity supported Sitting balance-Leahy Scale: Fair Sitting balance - Comments: fair/good seated at EOB   Standing balance support: During functional activity, Bilateral upper extremity supported, Reliant on assistive device for balance Standing  balance-Leahy Scale: Poor Standing balance comment: using RW                            Cognition Arousal/Alertness: Awake/alert Behavior During Therapy: WFL for tasks assessed/performed Overall Cognitive Status: Within Functional Limits for tasks assessed                                          Exercises General Exercises - Lower Extremity Long Arc Quad: Seated, AROM, Strengthening, Both, 10 reps Hip Flexion/Marching: Seated, AROM, Strengthening, Both, 10 reps Toe Raises: Seated, AROM, Strengthening, Both, 10 reps Heel Raises: Seated, AROM, Strengthening, Both, 10 reps    General Comments        Pertinent Vitals/Pain Pain Assessment Pain Assessment: Faces Faces Pain Scale: Hurts a little bit Pain Location: left big toe when weightbearing Pain Descriptors / Indicators: Sore Pain Intervention(s): Limited activity within patient's tolerance, Monitored during session, Repositioned    Home Living                          Prior Function            PT Goals (current goals can now be found in the care plan section) Acute Rehab PT Goals Patient Stated Goal: return home after rehab PT Goal Formulation: With patient Time For Goal Achievement: 01/04/22 Potential to Achieve Goals: Good Progress towards PT goals: Progressing toward goals    Frequency    Min 3X/week      PT Plan Current plan remains appropriate    Co-evaluation              AM-PAC PT "6 Clicks" Mobility   Outcome Measure  Help needed turning from your back to your side while in a flat bed without using bedrails?: A Little Help needed moving from lying on your back to sitting on the side of a flat bed without using bedrails?: A Lot Help needed moving to and from a bed to a chair (including a wheelchair)?: A Lot Help needed standing up from a chair using your arms (e.g., wheelchair or bedside chair)?: A Lot Help needed to walk in hospital room?: A Lot Help  needed climbing 3-5 steps with a railing? : Total 6 Click Score: 12    End of Session   Activity Tolerance: Patient tolerated treatment well;Patient limited by fatigue Patient left: in chair;with call bell/phone within reach;with family/visitor present Nurse Communication: Mobility status PT Visit Diagnosis: Unsteadiness on feet (R26.81);Other abnormalities of gait and mobility (R26.89);Muscle weakness (generalized) (M62.81)     Time: 3614-4315 PT Time Calculation (min) (ACUTE ONLY): 33 min  Charges:  $Therapeutic Exercise: 8-22 mins $Therapeutic Activity: 8-22 mins                     12:21 PM, 12/24/21 Lonell Grandchild, MPT Physical Therapist with [facility] [phone] office [phone] mobile phone

## 2021-12-24 NOTE — Consult Note (Addendum)
Consultation Note Date: 12/24/2021   Patient Name: [name]  DOB: 02-17-1930  MRN: [id number]  Age / Sex: 88 y.o., female  PCP: [name], MD Referring Physician: Orson Eva, MD  Reason for Consultation: Establishing goals of care  HPI/Patient Profile: 88 y.o. female  with past medical history of metastatic breast cancer, liver cirrhosis, HTN/HLD, GI bleed, severe aortic stenosis, p afib, seen by Dr. Raliegh Ip in Nov stopped treatments admitted on 12/20/2021 with lobar PNE.   Clinical Assessment and Goals of Care: I have reviewed medical records including EPIC notes, labs and imaging, received report from RN, assessed the patient and then met at the bedside along with daughter, Jackelyn Poling,  to discuss diagnosis prognosis, GOC, EOL wishes, disposition and options.  I introduced Palliative Medicine as specialized medical care for people living with serious illness. It focuses on providing relief from the symptoms and stress of a serious illness. The goal is to improve quality of life for both the patient and the family.  Daughter Jackelyn Poling states that they are active with outpatient palliative services through hospice of [facility].  We focused on their current illness.  Somewhat limited participation from [place].  She will wake when she is directly asked a question, but will drift off to sleep rather quickly.  She does not seem to participate in our conversation.  We talked about her pneumonia and the treatment plan.  We talked about recommendations for short-term rehab.  The natural disease trajectory and expectations at EOL were discussed.  Advanced directives, concepts specific to code status, were considered and discussed.  Although Mrs. Kreger has been DNR in the past, she is currently full code.  Daughter states that Dajanee, "wants to keep going".  We talked about the concept of "treat the treatable, but allowing  natural passing".  Hospice and Palliative Care services outpatient were explained and offered.  Ms. Mcfarlane is active with hospice of [facility] for outpatient palliative services.  This NP notified outpatient palliative service of Mrs. Leap's hospitalization.  We talk about the differences between "treat the treatable" hospice care and palliative services.  At this point Jackelyn Poling states that they would probably benefit from the services provided by hospice, but this discussion needs to be held with Mr. Thurgood and Michalina.  Discussed the importance of continued conversation with family and the medical providers regarding overall plan of care and treatment options, ensuring decisions are within the context of the patients values and GOCs. Questions and concerns were addressed.   The family was encouraged to call with questions or concerns.  PMT will continue to support holistically.  Conference with attending, bedside nursing staff and [facility] team related to patient condition, needs, and GOC.   Addendum: Return phone call from hospice of [facility] who shares that Mr. Schommer revoked outpatient palliative services for Mrs. Borelli in October 2022.   HCPOA  NEXT OF KIN - spouse Ron.  Daughter Jackelyn Poling and son unnamed help provide care.    SUMMARY OF RECOMMENDATIONS  At this point full scope/full code Short-term rehab Active with hospice of [facility] for outpatient palliative   Code Status/Advance Care Planning: Full code - We talk about "treat the treatable, but allow a natural death".   Unable to set limits on life support, states patient "wants to keep going".   Symptom Management:  Per hospitalist, no additional needs at this time.  Palliative Prophylaxis:  Frequent Pain Assessment and Oral Care  Additional Recommendations (Limitations, Scope, Preferences): Full Scope Treatment  Psycho-social/Spiritual:  Desire for further Chaplaincy support:no Additional Recommendations:  Caregiving  Support/Resources and Education on Hospice  Prognosis:  Unable to determine, based on outcomes.  Guarded at this point.  3 months or less would not be surprising based on acuity of condition, frailty.   Discharge Planning:  For term rehab versus home health.  Active with [facility] outpatient palliative services.       Primary Diagnoses: Present on Admission:  Lobar pneumonia ([place])   I have reviewed the medical record, interviewed the patient and family, and examined the patient. The following aspects are pertinent.  Past Medical History:  Diagnosis Date   Aortic stenosis    Cancer ([place]) 06/2018   right breast cancer   Cirrhosis ([place])    GERD (gastroesophageal reflux disease)    occasional    Hyperlipidemia    Hypertension    clearance with note Dr Nevada Crane on chart   Pneumonia    2 years ago/ states occ cough with sinus drainage- no fever   Thyroid nodule    with biopsy- states following medically   Social History   Socioeconomic History   Marital status: Married    Spouse name: Not on file   Number of children: 2   Years of education: some business school after HS   Highest education level: Not on file  Occupational History   Occupation: Cabin crew   Occupation: Network engineer   Tobacco Use   Smoking status: Never   Smokeless tobacco: Never  Vaping Use   Vaping Use: Never used  Substance and Sexual Activity   Alcohol use: No   Drug use: No   Sexual activity: Yes    Birth control/protection: None  Other Topics Concern   Not on file  Social History Narrative   Lives at home with her husband.   4 cups caffeine per day.   Right-handed.   Social Determinants of Health   Financial Resource Strain: Not on file  Food Insecurity: Not on file  Transportation Needs: Not on file  Physical Activity: Not on file  Stress: Not on file  Social Connections: Not on file   Family History  Problem Relation Age of Onset   Heart attack Mother    Bronchitis  Father    Diabetes Sister    Leukemia Brother    Lung disease Brother    Lung disease Sister    Scheduled Meds:  aspirin EC  81 mg Oral Daily   azithromycin  500 mg Oral Daily   cholecalciferol  1,000 Units Oral Daily   ferrous sulfate  325 mg Oral Q breakfast   heparin  5,000 Units Subcutaneous Q8H   lactulose  20 g Oral BID   metoprolol tartrate  12.5 mg Oral BID   multivitamin with minerals  1 tablet Oral Daily   pantoprazole  40 mg Oral BID AC   Continuous Infusions:  sodium chloride 75 mL/hr at 12/24/21 1051   cefTRIAXone (ROCEPHIN)  IV 1 g (12/24/21 0542)  magnesium sulfate bolus IVPB     PRN Meds:.acetaminophen **OR** acetaminophen, ondansetron **OR** ondansetron (ZOFRAN) IV, traMADol Medications Prior to Admission:  Prior to Admission medications   Medication Sig Start Date End Date Taking? Authorizing Provider  aspirin EC 81 MG tablet Take 81 mg by mouth daily. Swallow whole.   Yes [provider]  cholecalciferol (VITAMIN D3) 25 MCG (1000 UNIT) tablet Take 1,000 Units by mouth daily.   Yes [provider]  ferrous sulfate 325 (65 FE) MG tablet Take 325 mg by mouth daily with breakfast.   Yes [provider]  furosemide (LASIX) 20 MG tablet Take 20 mg by mouth daily as needed. 09/04/21  Yes [provider]  hydrocortisone 1 % ointment Apply 1 application topically 2 (two) times daily. Patient taking differently: Apply 1 application topically as needed. 09/18/18  Yes Lockamy, Randi L, NP-C  lactulose (CHRONULAC) 10 GM/15ML solution Take 30 mLs (20 g total) by mouth 2 (two) times daily. Patient taking differently: Take 20 g by mouth as needed. 10/12/20  Yes Nita Sells, MD  metoprolol tartrate (LOPRESSOR) 25 MG tablet Take 0.5 tablets (12.5 mg total) by mouth 2 (two) times daily. 08/02/21 12/21/21 Yes [place] Donnamarie Poag, DO  Multiple Vitamin (MULTIVITAMIN WITH MINERALS) TABS tablet Take 1 tablet by mouth daily.   Yes [provider]  zinc gluconate 50 MG tablet Take 50 mg by mouth daily.   Yes [provider]  Misc Natural Products (ESSIAC TONIC PO) Take by mouth daily.    [provider]  pantoprazole (PROTONIX) 40 MG tablet Take 1 tablet (40 mg total) by mouth 2 (two) times daily before a meal. Patient not taking: Reported on 12/21/2021 10/12/20   Nita Sells, MD   Allergies  Allergen Reactions   Penicillins Rash    Has patient had a PCN reaction causing immediate rash, facial/tongue/throat swelling, SOB or lightheadedness with hypotension: No Has patient had a PCN reaction causing severe rash involving mucus membranes or skin necrosis: No Has patient had a PCN reaction that required hospitalization: No Has patient had a PCN reaction occurring within the last 10 years: No If all of the above answers are "NO", then may proceed with Cephalosporin use.    Review of Systems  Unable to perform ROS: Acuity of condition   Physical Exam Vitals and nursing note reviewed.  Constitutional:      Comments: Lethargic   Cardiovascular:     Rate and Rhythm: Normal rate.  Pulmonary:     Effort: Pulmonary effort is normal. No respiratory distress.  Skin:    General: Skin is warm and dry.  Neurological:     Mental Status: She is oriented to person, place, and time.  Psychiatric:        Mood and Affect: Mood normal.        Behavior: Behavior normal.    Vital Signs: BP (!) 159/88 (BP Location: Left Arm)    Pulse 96    Temp 97.8 F (36.6 C)    Resp 20    Ht _0  (1.626 m)    Wt 81 kg    SpO2 96%    BMI 30.65 kg/m  Pain Scale: 0-10   Pain Score: 0-No pain   SpO2: SpO2: 96 % O2 Device:SpO2: 96 % O2 Flow Rate: .O2 Flow Rate (L/min): 2.5 L/min  IO: Intake/output summary:  Intake/Output Summary (Last 24 hours) at 12/24/2021 1200 Last data filed at 12/24/2021 0531 Gross per 24 hour  Intake  798.75 ml  Output 800 ml  Net -1.25 ml    LBM: Last BM Date : 12/23/21 Baseline  Weight: Weight: 81 kg Most recent weight: Weight: 81 kg     Palliative Assessment/Data:   Flowsheet Rows    Flowsheet Row Most Recent Value  Intake Tab   Referral Department Hospitalist  Unit at Time of Referral Med/Surg Unit  Palliative Care Primary Diagnosis Pulmonary  Date Notified 12/23/21  Palliative Care Type Return patient Palliative Care  Reason for referral Clarify Goals of Care  Date of Admission 12/20/21  Date first seen by Palliative Care 12/24/21  # of days Palliative referral response time 1 Day(s)  # of days IP prior to Palliative referral 3  Clinical Assessment   Palliative Performance Scale Score 30%  Pain Max last 24 hours Not able to report  Pain Min Last 24 hours Not able to report  Dyspnea Max Last 24 Hours Not able to report  Dyspnea Min Last 24 hours Not able to report  Psychosocial & Spiritual Assessment   Palliative Care Outcomes        Time In: 0915 Time Out: 1030 Time Total: 75 minutes  Greater than 50%  of this time was spent counseling and coordinating care related to the above assessment and plan.  Signed by: Drue Novel, NP   Please contact Palliative Medicine Team phone at [phone] for questions and concerns.  For individual provider: See Shea Evans

## 2021-12-24 NOTE — Progress Notes (Signed)
PROGRESS NOTE  [name] JHE:[id number] DOB: 1930-01-21 DOA: 12/20/2021 PCP: Adrienne Squibb, MD  Brief History:  88 year old female with a history of metastatic breast cancer, liver cirrhosis, hypertension, hyperlipidemia, GI bleed, severe aortic stenosis, paroxysmal atrial fibrillation presenting with 1 day history of generalized weakness and a mechanical fall.  The patient presented to the emergency department on 12/20/2021 evening with the above symptoms.  There was also history of a nonproductive cough and chest congestion for the past week prior to admission.  She went to see her PCP on the day of admission and was prescribed albuterol and other temporizing measures, but she had not started on any of the medications.  There was no antibiotics prescribed according to the patient's spouse.  Nevertheless, the patient has had generalized weakness for a number of months and has had to use a walker to assist with ambulation.  However, the patient's spouse noted that the patient had increasing generalized weakness, coughing, and chest congestion which is why he took her to see the PCP on that day.  She has also had some decreased oral intake.  The patient got up from the couch, but because of weakness resulted in a mechanical fall injuring her left great toe.  Prior to this, the patient has not had any fevers, chills, chest pain, shortness of breath, nausea, vomiting, diarrhea, abdominal pain, hematochezia, melena, dysuria, hematuria. Review the medical record shows that the patient had a recent visit with Dr. [name] on 09/11/2021.  At that time, the decision was made to transition the patient to palliative care/hospice care secondary to her progressive generalized weakness, liver disease, and bleeding.  Therefore, further therapy for her metastatic breast cancer was discontinued. In the ED, the patient was afebrile hemodynamically stable on room air.  She was seen by physical therapy who  recommended skilled nursing facility.  TOC assisted with transition to [facility] rehab.  The patient was waiting for insurance authorization.  However on the evening of 12/22/2021, the patient was noted to have some mild hypoxia with increasing oxygen requirement and coughing and some shortness of breath.  A chest x-ray was obtained on the evening of 12/22/2021 and showed opacities in the right middle lobe, right lower lobe and small bilateral pleural effusions.  Labs were repeated and showed WBC 7.0, hemoglobin 9.4, platelets 191,000.  Sodium 125, potassium 4.5, bicarbonate 19, serum creatinine 1.39.  The patient was started on ceftriaxone and azithromycin.  She was given 2 L of fluids.  Admission was requested for further treatment and evaluation of her pneumonia and hyponatremia.     Assessment and Plan: * Lobar pneumonia ([place])- (present on admission) Continue ceftriaxone and azithromycin Check PCT 0.41 Check lactic acid 1.1 CTA chest  Acute respiratory failure with hypoxia [facility]) Patient had shortness of breath with oxygen saturation 90% Stable on 2 L presently Wean oxygen for saturation greater than 92%  Displaced fracture of distal phalanx of left great toe, initial encounter for closed fracture Noted on xray foot Cam boot placed PT eval  Hypomagnesemia Replete  Metastatic breast cancer ([place]) Follows with Dr. [name] Per last visit on 09/11/21--all treatment has been stopped due to liver cirrhosis and bleeding Plan was to transition to palliative/hospice care  Cirrhosis of liver with ascites ([place]) Continue lactulose  Essential hypertension Initially Holding metoprolol titrate secondary to soft blood pressures -restart at BPs improve  Hyponatremia Secondary to volume depletion and poor solute intake Given 2  L normal saline in the ED A.m. BMP Hold furosemide Liberalize diet Start IV NS  Chronic kidney disease, stage 3b (HCC) Baseline creatinine 1.2-1.4 Monitor with serial  BMP  Paroxysmal atrial fibrillation (HCC) Currently in sinus rhythm Not a candidate for anticoagulation secondary to GI bleed and liver cirrhosis Holding metoprolol secondary to soft blood pressures>>restart  Goals of care, counseling/discussion Palliative consulted -had family meeting 2/27>>continue full scope of care -consult Dr. [name] interested in chemo         Status is: Inpatient Remains inpatient appropriate because: severity of illness requiring IV abx and supplemental oxygen            Family Communication:   daughter updated at bedside 2/27  Consultants:  palliative, med/once  Code Status:  FULL  DVT Prophylaxis:  [place] Heparin    Procedures: As Listed in Progress Note Above  Antibiotics: Ceftriaxone 2/26>> Azithro 2/26>>     Subjective: Patient has some dyspnea on exertion.  Denies f/c, cp, abd pain.  No n/v/d  Objective: Vitals:   12/23/21 1611 12/23/21 1632 12/23/21 2022 12/24/21 0508  BP: (!) 146/76 (!) 146/76 (!) 158/75 (!) 159/88  Pulse: 83 83 (!) 101 96  Resp: 20 20 20 20   Temp: 98 F (36.7 C)  99.3 F (37.4 C) 97.8 F (36.6 C)  TempSrc: Oral  Oral   SpO2: 96%  96% 96%  Weight:  81 kg    Height:  5\' 4"  (1.626 m)      Intake/Output Summary (Last 24 hours) at 12/24/2021 1201 Last data filed at 12/24/2021 0531 Gross per 24 hour  Intake 798.75 ml  Output 800 ml  Net -1.25 ml   Weight change:  Exam:  General:  Pt is alert, follows commands appropriately, not in acute distress HEENT: No icterus, No thrush, No neck mass, [place]/AT Cardiovascular: RRR, S1/S2, no rubs, no gallops Respiratory: bibasilar rales, L>R Abdomen: Soft/+BS, non tender, non distended, no guarding Extremities: No edema, No lymphangitis, No petechiae, No rashes, no synovitis   Data Reviewed: I have personally reviewed following labs and imaging studies Basic Metabolic Panel: Recent Labs  Lab 12/20/21 2140 12/23/21 0621 12/24/21 0503  NA  129* 125* 127*  K 3.7 4.5 3.9  CL 98 96* 100  CO2 20* 19* 17*  GLUCOSE 111* 82 125*  BUN 31* 37* 30*  CREATININE 1.25* 1.39* 1.27*  CALCIUM 9.8 8.9 9.1  MG  --   --  1.6*   Liver Function Tests: No results for input(s): AST, ALT, ALKPHOS, BILITOT, PROT, ALBUMIN in the last 168 hours. No results for input(s): LIPASE, AMYLASE in the last 168 hours. Recent Labs  Lab 12/20/21 2140  AMMONIA 23   Coagulation Profile: No results for input(s): INR, PROTIME in the last 168 hours. CBC: Recent Labs  Lab 12/20/21 2140 12/23/21 0621 12/24/21 0503  WBC 9.6 10.0 10.1  NEUTROABS  --  7.1  --   HGB 12.8 11.4* 11.9*  HCT 37.3 36.3 36.4  MCV 89.9 92.6 89.0  PLT 229 191 212   Cardiac Enzymes: No results for input(s): CKTOTAL, CKMB, CKMBINDEX, TROPONINI in the last 168 hours. BNP: Invalid input(s): POCBNP CBG: Recent Labs  Lab 12/20/21 2204  GLUCAP 101*   HbA1C: No results for input(s): HGBA1C in the last 72 hours. Urine analysis:    Component Value Date/Time   COLORURINE YELLOW 12/20/2021 2330   APPEARANCEUR HAZY (A) 12/20/2021 2330   LABSPEC 1.014 12/20/2021 2330   PHURINE 6.0 12/20/2021 2330  GLUCOSEU NEGATIVE 12/20/2021 2330   HGBUR SMALL (A) 12/20/2021 2330   BILIRUBINUR NEGATIVE 12/20/2021 2330   KETONESUR NEGATIVE 12/20/2021 2330   PROTEINUR >=300 (A) 12/20/2021 2330   UROBILINOGEN 0.2 12/05/2011 1037   NITRITE NEGATIVE 12/20/2021 2330   LEUKOCYTESUR NEGATIVE 12/20/2021 2330   Sepsis Labs: @LABRCNTIP (procalcitonin:4,lacticidven:4) ) Recent Results (from the past 240 hour(s))  Urine Culture     Status: Abnormal   Collection Time: 12/20/21 11:11 PM   Specimen: Urine, Clean Catch  Result Value Ref Range Status   Specimen Description   Final    URINE, CLEAN CATCH Performed at [facility], [address] [place], [place] [address]    Special Requests   Final    NONE Performed at [facility], [address], [place], [place] [address]    Culture >=100,000  COLONIES/mL KLEBSIELLA PNEUMONIAE (A)  Final   Report Status 12/24/2021 FINAL  Final   Organism ID, Bacteria KLEBSIELLA PNEUMONIAE (A)  Final      Susceptibility   Klebsiella pneumoniae - MIC*    AMPICILLIN RESISTANT Resistant     CEFAZOLIN <=4 SENSITIVE Sensitive     CEFEPIME <=0.12 SENSITIVE Sensitive     CEFTRIAXONE <=0.25 SENSITIVE Sensitive     CIPROFLOXACIN <=0.25 SENSITIVE Sensitive     GENTAMICIN <=1 SENSITIVE Sensitive     IMIPENEM <=0.25 SENSITIVE Sensitive     NITROFURANTOIN <=16 SENSITIVE Sensitive     TRIMETH/SULFA <=20 SENSITIVE Sensitive     AMPICILLIN/SULBACTAM <=2 SENSITIVE Sensitive     PIP/TAZO <=4 SENSITIVE Sensitive     * >=100,000 COLONIES/mL KLEBSIELLA PNEUMONIAE  Resp Panel by RT-PCR (Flu A&B, Covid) Nasopharyngeal Swab     Status: None   Collection Time: 12/23/21  5:25 AM   Specimen: Nasopharyngeal Swab; Nasopharyngeal(NP) swabs in vial transport medium  Result Value Ref Range Status   SARS Coronavirus 2 by RT PCR NEGATIVE NEGATIVE Final    Comment: (NOTE) SARS-CoV-2 target nucleic acids are NOT DETECTED.  The SARS-CoV-2 RNA is generally detectable in upper respiratory specimens during the acute phase of infection. The lowest concentration of SARS-CoV-2 viral copies this assay can detect is 138 copies/mL. A negative result does not preclude SARS-Cov-2 infection and should not be used as the sole basis for treatment or other patient management decisions. A negative result may occur with  improper specimen collection/handling, submission of specimen other than nasopharyngeal swab, presence of viral mutation(s) within the areas targeted by this assay, and inadequate number of viral copies(<138 copies/mL). A negative result must be combined with clinical observations, patient history, and epidemiological information. The expected result is Negative.  Fact Sheet for Patients:  EntrepreneurPulse.com.au  Fact Sheet for Healthcare Providers:   IncredibleEmployment.be  This test is no t yet approved or cleared by the [place] FDA and  has been authorized for detection and/or diagnosis of SARS-CoV-2 by FDA under an Emergency Use Authorization (EUA). This EUA will remain  in effect (meaning this test can be used) for the duration of the COVID-19 declaration under Section 564(b)(1) of the Act, 21 U.S.C.section 360bbb-3(b)(1), unless the authorization is terminated  or revoked sooner.       Influenza A by PCR NEGATIVE NEGATIVE Final   Influenza B by PCR NEGATIVE NEGATIVE Final    Comment: (NOTE) The Xpert Xpress SARS-CoV-2/FLU/RSV plus assay is intended as an aid in the diagnosis of influenza from Nasopharyngeal swab specimens and should not be used as a sole basis for treatment. Nasal washings and aspirates are unacceptable for Xpert  Xpress SARS-CoV-2/FLU/RSV testing.  Fact Sheet for Patients: EntrepreneurPulse.com.au  Fact Sheet for Healthcare Providers: IncredibleEmployment.be  This test is not yet approved or cleared by the [place] FDA and has been authorized for detection and/or diagnosis of SARS-CoV-2 by FDA under an Emergency Use Authorization (EUA). This EUA will remain in effect (meaning this test can be used) for the duration of the COVID-19 declaration under Section 564(b)(1) of the Act, 21 U.S.C. section 360bbb-3(b)(1), unless the authorization is terminated or revoked.  Performed at [facility], [address] [place], [place] [address]      Scheduled Meds:  aspirin EC  81 mg Oral Daily   azithromycin  500 mg Oral Daily   cholecalciferol  1,000 Units Oral Daily   ferrous sulfate  325 mg Oral Q breakfast   heparin  5,000 Units Subcutaneous Q8H   lactulose  20 g Oral BID   metoprolol tartrate  12.5 mg Oral BID   multivitamin with minerals  1 tablet Oral Daily   pantoprazole  40 mg Oral BID AC   Continuous Infusions:  sodium chloride  75 mL/hr at 12/24/21 1051   cefTRIAXone (ROCEPHIN)  IV 1 g (12/24/21 0542)   magnesium sulfate bolus IVPB      Procedures/Studies: DG Chest Port 1 View  Result Date: 12/23/2021 CLINICAL DATA:  88 year old female with history of cough. EXAM: PORTABLE CHEST 1 VIEW COMPARISON:  Chest x-ray 10/31/2021. FINDINGS: Right internal jugular single-lumen porta cath with tip terminating in the mid superior vena cava. Lung volumes are normal. Patchy ill-defined opacities and areas of interstitial prominence are noted throughout the right mid to lower lung and the left lung base. Blunting of the costophrenic sulci bilaterally, indicative of small bilateral pleural effusions. No pneumothorax. Mild cardiomegaly. Upper mediastinal contours are distorted by patient's rotation to the right. IMPRESSION: 1. Findings are concerning for multilobar bilateral bronchopneumonia, most evident throughout the right mid to lower lung and left lung base. 2. Small bilateral pleural effusions. 3. Mild cardiomegaly. Electronically Signed   By: Vinnie Langton M.D.   On: 12/23/2021 05:04   DG Toe Great Left  Result Date: 12/20/2021 CLINICAL DATA:  Injury bruising EXAM: LEFT GREAT TOE COMPARISON:  None. FINDINGS: Bones appear demineralized. Acute minimally displaced intra-articular fracture at the base of the first proximal phalanx. Positive for soft tissue swelling at the MTP joint. No subluxation IMPRESSION: Acute mildly displaced intra-articular fracture at the base of the first proximal phalanx. Electronically Signed   By: Donavan Foil M.D.   On: 12/20/2021 22:03    Orson Eva, DO  [facility]  If 7PM-7AM, please contact night-coverage [email or web address] Password [email or web address] 12/24/2021, 12:01 PM   LOS: 0 days

## 2021-12-25 DIAGNOSIS — I455 Other specified heart block: Secondary | ICD-10-CM

## 2021-12-25 DIAGNOSIS — I10 Essential (primary) hypertension: Secondary | ICD-10-CM

## 2021-12-25 DIAGNOSIS — J90 Pleural effusion, not elsewhere classified: Secondary | ICD-10-CM

## 2021-12-25 LAB — BASIC METABOLIC PANEL
Anion gap: 10 (ref 5–15)
BUN: 29 mg/dL — ABNORMAL HIGH (ref 8–23)
CO2: 17 mmol/L — ABNORMAL LOW (ref 22–32)
Calcium: 8.9 mg/dL (ref 8.9–10.3)
Chloride: 102 mmol/L (ref 98–111)
Creatinine, Ser: 1.25 mg/dL — ABNORMAL HIGH (ref 0.44–1.00)
GFR, Estimated: 41 mL/min — ABNORMAL LOW (ref >60)
Glucose, Bld: 136 mg/dL — ABNORMAL HIGH (ref 70–99)
Potassium: 3.8 mmol/L (ref 3.5–5.1)
Sodium: 129 mmol/L — ABNORMAL LOW (ref 135–145)

## 2021-12-25 LAB — CBC
HCT: 35.9 % — ABNORMAL LOW (ref 36.0–46.0)
Hemoglobin: 11.9 g/dL — ABNORMAL LOW (ref 12.0–15.0)
MCH: 29.4 pg (ref 26.0–34.0)
MCHC: 33.1 g/dL (ref 30.0–36.0)
MCV: 88.6 fL (ref 80.0–100.0)
Platelets: 243 10*3/uL (ref 150–400)
RBC: 4.05 MIL/uL (ref 3.87–5.11)
RDW: 16.3 % — ABNORMAL HIGH (ref 11.5–15.5)
WBC: 9.2 10*3/uL (ref 4.0–10.5)
nRBC: 0 % (ref 0.0–0.2)

## 2021-12-25 LAB — MAGNESIUM: Magnesium: 2.1 mg/dL (ref 1.7–2.4)

## 2021-12-25 MED ORDER — CEFDINIR 300 MG PO CAPS
300.0000 mg | ORAL_CAPSULE | Freq: Two times a day (BID) | ORAL | Status: DC
Start: 2021-12-25 — End: 2021-12-25
  Administered 2021-12-25: 300 mg via ORAL
  Filled 2021-12-25: qty 1

## 2021-12-25 MED ORDER — TRAMADOL HCL 50 MG PO TABS: 50.0000 mg | ORAL_TABLET | Freq: Four times a day (QID) | ORAL | 0 refills | Status: AC | PRN

## 2021-12-25 MED ORDER — AZITHROMYCIN 500 MG PO TABS
500.0000 mg | ORAL_TABLET | Freq: Every day | ORAL | Status: AC
Start: 2021-12-25 — End: ?

## 2021-12-25 MED ORDER — CEFDINIR 300 MG PO CAPS: 300.0000 mg | ORAL_CAPSULE | Freq: Two times a day (BID) | ORAL | Status: AC

## 2021-12-25 NOTE — Assessment & Plan Note (Signed)
Discussed with cardiology whom reviewed telemetry Not PPM candidate Will need to tolerate a degree of tachy-brady episodes Having asymptomatic nocturnal episodes

## 2021-12-25 NOTE — Discharge Summary (Addendum)
Physician Discharge Summary   Patient: [name] MRN: [id number] DOB: 11/17/1929  Admit date:     12/20/2021  Discharge date: 12/25/21  Discharge Physician: Shanon Brow Deniro Laymon   PCP: [name], MD   Recommendations at discharge:   Please follow up with primary care provider within 1-2 weeks  Please repeat BMP and CBC in one week Maintain patient on 2L [place] and wean as tolerated for saturation >90%       Hospital Course: 88 year old female with a history of metastatic breast cancer, liver cirrhosis, hypertension, hyperlipidemia, GI bleed, severe aortic stenosis, paroxysmal atrial fibrillation presenting with 1 day history of generalized weakness and a mechanical fall.  The patient presented to the emergency department on 12/20/2021 evening with the above symptoms.  There was also history of a nonproductive cough and chest congestion for the past week prior to admission.  She went to see her PCP on the day of admission and was prescribed albuterol and other temporizing measures, but she had not started on any of the medications.  There was no antibiotics prescribed according to the patient's spouse.  Nevertheless, the patient has had generalized weakness for a number of months and has had to use a walker to assist with ambulation.  However, the patient's spouse noted that the patient had increasing generalized weakness, coughing, and chest congestion which is why he took her to see the PCP on that day.  She has also had some decreased oral intake.  The patient got up from the couch, but because of weakness resulted in a mechanical fall injuring her left great toe.  Prior to this, the patient has not had any fevers, chills, chest pain, shortness of breath, nausea, vomiting, diarrhea, abdominal pain, hematochezia, melena, dysuria, hematuria. Review the medical record shows that the patient had a recent visit with Dr. [name] on 09/11/2021.  At that time, the decision was made to transition the patient  to palliative care/hospice care secondary to her progressive generalized weakness, liver disease, and bleeding.  Therefore, further therapy for her metastatic breast cancer was discontinued. In the ED, the patient was afebrile hemodynamically stable on room air.  She was seen by physical therapy who recommended skilled nursing facility.  TOC assisted with transition to [facility] rehab.  The patient was waiting for insurance authorization.  However on the evening of 12/22/2021, the patient was noted to have some mild hypoxia with increasing oxygen requirement and coughing and some shortness of breath.  A chest x-ray was obtained on the evening of 12/22/2021 and showed opacities in the right middle lobe, right lower lobe and small bilateral pleural effusions.  Labs were repeated and showed WBC 7.0, hemoglobin 9.4, platelets 191,000.  Sodium 125, potassium 4.5, bicarbonate 19, serum creatinine 1.39.  The patient was started on ceftriaxone and azithromycin.  She was given 2 L of fluids.  Admission was requested for further treatment and evaluation of her pneumonia and hyponatremia.  Assessment and Plan: * Lobar pneumonia ([place])- (present on admission) Continue ceftriaxone and azithromycin Check PCT 0.41 Check lactic acid 1.1 2/27 CTA chest--no PE; scattered nodular densities in both lungs largest in the left lower lobe measuring approximately 2.6 cm. There is interval increase in number and size of metastatic lesions in the lung fields. Small to moderate bilateral pleural effusions are seen, more on the right side;  patchy infiltrates in the lower lung fields D/c with 4 more days cefdinir and azithro  Acute respiratory failure with hypoxia [facility]) Patient had shortness  of breath with oxygen saturation 90% Due to pneumonia and pleural effusions Stable on 2 L presently Wean oxygen for saturation greater than 92% 2/27 CTA chest--no PE; small bilateral pleural effusions; increase number of nodules and increase  mediastinal lymphadenopathy    Closed nondisplaced fracture of left great toe Noted on xray foot Cam boot placed PT eval  Sinus pause Discussed with cardiology whom reviewed telemetry Not PPM candidate Will need to tolerate a degree of tachy-brady episodes Having asymptomatic nocturnal episodes  Pleural effusion Noted on CTA chest CTA chest--no PE Spouse and daughter do not want to pursue any invasive procedures at this time  Hypomagnesemia Replete  Metastatic breast cancer ([place]) Follows with Dr. [name] Per last visit on 09/11/21--all treatment has been stopped due to liver cirrhosis and bleeding Plan was to transition to palliative/hospice care Dr. [name] (med/onc) was consulted and confirmed plan for no further active cancer treatment and to go to SNF with ultimate transition to home with hospice  Cirrhosis of liver with ascites ([place]) Continue lactulose  Essential hypertension Initially Holding metoprolol titrate secondary to soft blood pressures -restart at BPs improve  Hyponatremia Secondary to volume depletion and poor solute intake Given 2 L normal saline in the ED A.m. BMP Hold furosemide Liberalize diet Start IV NS  Chronic kidney disease, stage 3b (HCC) Baseline creatinine 1.2-1.4 Monitor with serial BMP  Paroxysmal atrial fibrillation (HCC) Currently in sinus rhythm Not a candidate for anticoagulation secondary to GI bleed and liver cirrhosis Holding metoprolol secondary to soft blood pressures>>restart  Goals of care, counseling/discussion Palliative consulted -had family meeting 2/27>>continue full scope of care -consult Dr. [name] interested in chemo         Pain control - [place] was reviewed. and patient was instructed, not to drive, operate heavy machinery, perform activities at heights, swimming or participation in water activities or provide baby-sitting services  while on Pain, Sleep and Anxiety Medications; until their outpatient Physician has advised to do so again. Also recommended to not to take more than prescribed Pain, Sleep and Anxiety Medications.   Consultants: palliative Procedures performed: none  Disposition: Skilled nursing facility Diet recommendation:  Regular diet  DISCHARGE MEDICATION: Allergies as of 12/25/2021       Reactions   Penicillins Rash   Has patient had a PCN reaction causing immediate rash, facial/tongue/throat swelling, SOB or lightheadedness with hypotension: No Has patient had a PCN reaction causing severe rash involving mucus membranes or skin necrosis: No Has patient had a PCN reaction that required hospitalization: No Has patient had a PCN reaction occurring within the last 10 years: No If all of the above answers are "NO", then may proceed with Cephalosporin use.        Medication List     STOP taking these medications    pantoprazole 40 MG tablet Commonly known as: PROTONIX       TAKE these medications    aspirin EC 81 MG tablet Take 81 mg by mouth daily. Swallow whole.   azithromycin 500 MG tablet Commonly known as: ZITHROMAX Take 1 tablet (500 mg total) by mouth daily. X 4 days   cefdinir 300 MG capsule Commonly known as: OMNICEF Take 1 capsule (300 mg total) by mouth every 12 (twelve) hours. X 4 days   cholecalciferol 25 MCG (1000 UNIT) tablet Commonly known as: VITAMIN D3 Take 1,000 Units by mouth daily.   ESSIAC TONIC PO Take by mouth daily.   ferrous sulfate 325 (  65 FE) MG tablet Take 325 mg by mouth daily with breakfast.   furosemide 20 MG tablet Commonly known as: LASIX Take 20 mg by mouth daily as needed.   hydrocortisone 1 % ointment Apply 1 application topically 2 (two) times daily. What changed:  when to take this reasons to take this   lactulose 10 GM/15ML solution Commonly known as: CHRONULAC Take 30 mLs (20 g total) by mouth 2 (two) times daily. What  changed:  when to take this reasons to take this   metoprolol tartrate 25 MG tablet Commonly known as: LOPRESSOR Take 0.5 tablets (12.5 mg total) by mouth 2 (two) times daily.   multivitamin with minerals Tabs tablet Take 1 tablet by mouth daily.   traMADol 50 MG tablet Commonly known as: ULTRAM Take 1 tablet (50 mg total) by mouth every 6 (six) hours as needed for moderate pain.   zinc gluconate 50 MG tablet Take 50 mg by mouth daily.        Contact information for after-discharge care     [facility] .   Service: Skilled Nursing Contact information: [address] [place] [phone]                     Discharge Exam: [facility] Weights   12/20/21 2038 12/23/21 1632  Weight: 81 kg 81 kg   HEENT:  [place]/AT, No thrush, no icterus CV:  RRR, no rub, no S3, no S4 Lung:  bibasilar  crackles., no wheeze, no rhonchi Abd:  soft/+BS, NT Ext:  No edema, no lymphangitis, no synovitis, no rash   Condition at discharge: stable  The results of significant diagnostics from this hospitalization (including imaging, microbiology, ancillary and laboratory) are listed below for reference.   Imaging Studies: CT Angio Chest Pulmonary Embolism (PE) W or WO Contrast  Result Date: 12/24/2021 CLINICAL DATA:  Chest pain, shortness of breath EXAM: CT ANGIOGRAPHY CHEST WITH CONTRAST TECHNIQUE: Multidetector CT imaging of the chest was performed using the standard protocol during bolus administration of intravenous contrast. Multiplanar CT image reconstructions and MIPs were obtained to evaluate the vascular anatomy. RADIATION DOSE REDUCTION: This exam was performed according to the departmental dose-optimization program which includes automated exposure control, adjustment of the mA and/or kV according to patient size and/or use of iterative reconstruction technique. CONTRAST:  53mL OMNIPAQUE IOHEXOL 350  MG/ML SOLN COMPARISON:  09/05/2021 FINDINGS: Cardiovascular: There is homogeneous enhancement in thoracic aorta. Coronary artery calcifications are seen. There is ectasia of main pulmonary artery measuring 3.7 cm suggesting pulmonary arterial hypertension. There are no intraluminal filling defects in central pulmonary artery branches. Evaluation of small peripheral branches, especially in the lower lung fields is limited by motion artifacts and infiltrates. Mediastinum/Nodes: There are enlarged lymph nodes in the mediastinum largest in the precarinal region measuring approximately 2.9 x 2 cm. There are enlarged lymph nodes in both hilar regions, more so on the right side. Largest of the nodes in the right hilum measures proximally 3.6 x 3 cm. There is interval increase in size and number of enlarged lymph nodes in mediastinum and hilar regions. Lungs/Pleura: There are scattered nodular densities in both lungs largest in the left lower lobe measuring approximately 2.6 cm. There is interval increase in number and size of metastatic lesions in the lung fields. There is new linear nodular density in the medial left upper lung fields, possibly pulmonary nodules or pleural nodules from  metastatic disease. Small to moderate bilateral pleural effusions are seen, more so on the right side. This finding was not seen in the previous study. There are patchy infiltrates in the lower lung fields, possibly due to atelectasis, more so on the right side. Upper Abdomen: There is fatty infiltration in the liver. Small hiatal hernia is seen. Musculoskeletal: There is 4.9 x 4.7 cm mass in the right breast which measured 4 cm in maximum diameter in the previous study suggesting possible interval progression of primary malignant neoplasm in the right breast. Review of the MIP images confirms the above findings. IMPRESSION: There is no evidence of central pulmonary artery embolism. Evaluation of small peripheral pulmonary artery branches  is limited by motion artifacts and infiltrates. There is ectasia of main pulmonary artery suggesting pulmonary arterial hypertension. There is no evidence of thoracic aortic dissection. There are scattered coronary artery calcifications. There are abnormally enlarged lymph nodes in mediastinum, both hilar regions and left axilla with interval increase in size and number suggesting progression of metastatic lymphadenopathy. There are scattered nodules of varying sizes in both lungs with interval increase in number and size suggesting progression of pulmonary metastatic disease. There is interval appearance of small to moderate bilateral pleural effusions, more so on the right side suggesting possible pleural metastatic disease. There is interval increase in size of soft tissue mass in the right breast possibly suggesting progression of primary neoplasm in the right breast. Electronically Signed   By: Elmer Picker M.D.   On: 12/24/2021 13:01   DG Chest Port 1 View  Result Date: 12/23/2021 CLINICAL DATA:  88 year old female with history of cough. EXAM: PORTABLE CHEST 1 VIEW COMPARISON:  Chest x-ray 10/31/2021. FINDINGS: Right internal jugular single-lumen porta cath with tip terminating in the mid superior vena cava. Lung volumes are normal. Patchy ill-defined opacities and areas of interstitial prominence are noted throughout the right mid to lower lung and the left lung base. Blunting of the costophrenic sulci bilaterally, indicative of small bilateral pleural effusions. No pneumothorax. Mild cardiomegaly. Upper mediastinal contours are distorted by patient's rotation to the right. IMPRESSION: 1. Findings are concerning for multilobar bilateral bronchopneumonia, most evident throughout the right mid to lower lung and left lung base. 2. Small bilateral pleural effusions. 3. Mild cardiomegaly. Electronically Signed   By: Vinnie Langton M.D.   On: 12/23/2021 05:04   DG Toe Great Left  Result Date:  12/20/2021 CLINICAL DATA:  Injury bruising EXAM: LEFT GREAT TOE COMPARISON:  None. FINDINGS: Bones appear demineralized. Acute minimally displaced intra-articular fracture at the base of the first proximal phalanx. Positive for soft tissue swelling at the MTP joint. No subluxation IMPRESSION: Acute mildly displaced intra-articular fracture at the base of the first proximal phalanx. Electronically Signed   By: Donavan Foil M.D.   On: 12/20/2021 22:03    Microbiology: Results for orders placed or performed during the hospital encounter of 12/20/21  Urine Culture     Status: Abnormal   Collection Time: 12/20/21 11:11 PM   Specimen: Urine, Clean Catch  Result Value Ref Range Status   Specimen Description   Final    URINE, CLEAN CATCH Performed at [facility], [address] [place], [place] [address]    Special Requests   Final    NONE Performed at [facility], [address], [place], [place] [address]    Culture >=100,000 COLONIES/mL KLEBSIELLA PNEUMONIAE (A)  Final   Report Status 12/24/2021 FINAL  Final   Organism ID, Bacteria KLEBSIELLA PNEUMONIAE (  A)  Final      Susceptibility   Klebsiella pneumoniae - MIC*    AMPICILLIN RESISTANT Resistant     CEFAZOLIN <=4 SENSITIVE Sensitive     CEFEPIME <=0.12 SENSITIVE Sensitive     CEFTRIAXONE <=0.25 SENSITIVE Sensitive     CIPROFLOXACIN <=0.25 SENSITIVE Sensitive     GENTAMICIN <=1 SENSITIVE Sensitive     IMIPENEM <=0.25 SENSITIVE Sensitive     NITROFURANTOIN <=16 SENSITIVE Sensitive     TRIMETH/SULFA <=20 SENSITIVE Sensitive     AMPICILLIN/SULBACTAM <=2 SENSITIVE Sensitive     PIP/TAZO <=4 SENSITIVE Sensitive     * >=100,000 COLONIES/mL KLEBSIELLA PNEUMONIAE  Resp Panel by RT-PCR (Flu A&B, Covid) Nasopharyngeal Swab     Status: None   Collection Time: 12/23/21  5:25 AM   Specimen: Nasopharyngeal Swab; Nasopharyngeal(NP) swabs in vial transport medium  Result Value Ref Range Status   SARS Coronavirus 2 by RT PCR NEGATIVE NEGATIVE Final     Comment: (NOTE) SARS-CoV-2 target nucleic acids are NOT DETECTED.  The SARS-CoV-2 RNA is generally detectable in upper respiratory specimens during the acute phase of infection. The lowest concentration of SARS-CoV-2 viral copies this assay can detect is 138 copies/mL. A negative result does not preclude SARS-Cov-2 infection and should not be used as the sole basis for treatment or other patient management decisions. A negative result may occur with  improper specimen collection/handling, submission of specimen other than nasopharyngeal swab, presence of viral mutation(s) within the areas targeted by this assay, and inadequate number of viral copies(<138 copies/mL). A negative result must be combined with clinical observations, patient history, and epidemiological information. The expected result is Negative.  Fact Sheet for Patients:  EntrepreneurPulse.com.au  Fact Sheet for Healthcare Providers:  IncredibleEmployment.be  This test is no t yet approved or cleared by the [place] FDA and  has been authorized for detection and/or diagnosis of SARS-CoV-2 by FDA under an Emergency Use Authorization (EUA). This EUA will remain  in effect (meaning this test can be used) for the duration of the COVID-19 declaration under Section 564(b)(1) of the Act, 21 U.S.C.section 360bbb-3(b)(1), unless the authorization is terminated  or revoked sooner.       Influenza A by PCR NEGATIVE NEGATIVE Final   Influenza B by PCR NEGATIVE NEGATIVE Final    Comment: (NOTE) The Xpert Xpress SARS-CoV-2/FLU/RSV plus assay is intended as an aid in the diagnosis of influenza from Nasopharyngeal swab specimens and should not be used as a sole basis for treatment. Nasal washings and aspirates are unacceptable for Xpert Xpress SARS-CoV-2/FLU/RSV testing.  Fact Sheet for Patients: EntrepreneurPulse.com.au  Fact Sheet for Healthcare  Providers: IncredibleEmployment.be  This test is not yet approved or cleared by the [place] FDA and has been authorized for detection and/or diagnosis of SARS-CoV-2 by FDA under an Emergency Use Authorization (EUA). This EUA will remain in effect (meaning this test can be used) for the duration of the COVID-19 declaration under Section 564(b)(1) of the Act, 21 U.S.C. section 360bbb-3(b)(1), unless the authorization is terminated or revoked.  Performed at [facility], [address], [place], [place] [address]     Labs: CBC: Recent Labs  Lab 12/20/21 2140 12/23/21 0621 12/24/21 0503 12/25/21 0510  WBC 9.6 10.0 10.1 9.2  NEUTROABS  --  7.1  --   --   HGB 12.8 11.4* 11.9* 11.9*  HCT 37.3 36.3 36.4 35.9*  MCV 89.9 92.6 89.0 88.6  PLT 229 191 212 737   Basic Metabolic Panel: Recent  Labs  Lab 12/20/21 2140 12/23/21 0621 12/24/21 0503 12/25/21 0510  NA 129* 125* 127* 129*  K 3.7 4.5 3.9 3.8  CL 98 96* 100 102  CO2 20* 19* 17* 17*  GLUCOSE 111* 82 125* 136*  BUN 31* 37* 30* 29*  CREATININE 1.25* 1.39* 1.27* 1.25*  CALCIUM 9.8 8.9 9.1 8.9  MG  --   --  1.6* 2.1   Liver Function Tests: No results for input(s): AST, ALT, ALKPHOS, BILITOT, PROT, ALBUMIN in the last 168 hours. CBG: Recent Labs  Lab 12/20/21 2204  GLUCAP 101*    Discharge time spent: greater than 30 minutes.  Signed: Orson Eva, MD [facility] 12/25/2021

## 2021-12-25 NOTE — Progress Notes (Addendum)
Palliative:  The Kallio family was able to meet with oncologist, Dr. Raliegh Ip, yesterday evening.  Daughter Jackelyn Poling shares that he has recommended hospice care as Mrs. Fillingim is too weak for further chemotherapy.  Jackelyn Poling shares that they were told that Mrs. Gitlin may have only a few months to live.  We talked about leaning on friends, loved ones for support.  Conference with transition of care team related to patient condition and needs.  Plan: At this point family is leaning toward a period of time and short-term rehab to improve functional status prior to going home.  Per Dr. Marthann Schiller note, family states that they would except hospice after rehab.  25 minutes Quinn Axe, NP Palliative Medicine Team  Team phone [phone] Greater than 50% of this time was spent counseling and coordinating care related to the above assessment and plan.

## 2021-12-25 NOTE — Progress Notes (Signed)
Call from Telemetry Patient has had three recurrent pauses, first of 6 second and two of 4 Seconds. Entered room and patient is resting and breathing normally. Notified Camera operator and MD. No new orders at this time.

## 2021-12-25 NOTE — Assessment & Plan Note (Signed)
Noted on CTA chest CTA chest--no PE Spouse and daughter do not want to pursue any invasive procedures at this time

## 2021-12-25 NOTE — TOC Transition Note (Signed)
Transition of Care [facility]) - CM/SW Discharge Note   Patient Details  Name: [name] MRN: [id number] Date of Birth: [birth year]  [facility] CM/SW Contact:  Salome Arnt, [place] Phone Number: 12/25/2021, 12:32 PM   Clinical Narrative:  Pt d/c today to [facility]. Pt's husband and facility aware and agreeable. Husband requests transport via [facility]. D/C summary sent to SNF. Authorization received. RN given number to call report.      Final next level of care: [place] Barriers to Discharge: Barriers Resolved   Patient Goals and CMS Choice        Discharge Placement              Patient [facility] bed at: [facility] Patient to be transferred to facility by: [facility] Name of family member notified: husband Patient and family notified of of transfer: 12/25/21  Discharge Plan and Services                                     Social Determinants of Health (SDOH) Interventions     Readmission Risk Interventions No flowsheet data found.

## 2021-12-25 NOTE — Progress Notes (Signed)
Notified by primary team about patient and nocturnal pauses overnight. Tele reviewed, pauses overnight up to 4.6 seconds. No daytime pauses noted,she has been asymptomatic. 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 She was managed with low dose lopressor 12.5mg  bid, not anticoag candidate due to prior GI bleed, cirrhosis, esophageal varices.   Multiple advanced comoridities with cirrhosis, severe aortic stenosis not a replacement candidate, metastatic breast cancer with progression by imaging this admission with ongoing discussions between oncology, palliative care, and the family about home hospice (has been active with outpatient palliative care for palliative services). She for these reasons in general would not be a pacemaker candidate. She has had just nocturnal pauses that are asymptomatic, intermittent paroxsmal afib at times with elevated rates. Would continue her lopressor at 12.5mg  bid at this time, we will follow telemetry tomorrow to see if any medication adjustments needed but essentially will have to tolerate some degree of tachy brady episodes given her overall medical circumstances.   Carlyle Dolly MD

## 2021-12-25 NOTE — Care Management Important Message (Signed)
Important Message  Patient Details  Name: [name] MRN: [id number] Date of Birth: October 27, 1930   Medicare Important Message Given:  N/A - LOS <3 / Initial given by admissions     Tommy Medal 12/25/2021, 10:50 AM

## 2021-12-27 ENCOUNTER — Encounter (HOSPITAL_COMMUNITY): Payer: Self-pay | Admitting: *Deleted

## 2021-12-27 ENCOUNTER — Emergency Department (HOSPITAL_COMMUNITY): Payer: Medicare Other

## 2021-12-27 ENCOUNTER — Inpatient Hospital Stay (HOSPITAL_COMMUNITY): Payer: Medicare Other

## 2021-12-27 ENCOUNTER — Other Ambulatory Visit: Payer: Self-pay

## 2021-12-27 ENCOUNTER — Inpatient Hospital Stay (HOSPITAL_COMMUNITY)
Admission: EM | Admit: 2021-12-27 | Discharge: 2022-01-26 | DRG: 441 | Disposition: E | Payer: Medicare Other | Source: Skilled Nursing Facility | Attending: Internal Medicine | Admitting: Internal Medicine

## 2021-12-27 DIAGNOSIS — J910 Malignant pleural effusion: Secondary | ICD-10-CM | POA: Diagnosis present

## 2021-12-27 DIAGNOSIS — I10 Essential (primary) hypertension: Secondary | ICD-10-CM | POA: Diagnosis not present

## 2021-12-27 DIAGNOSIS — J9811 Atelectasis: Secondary | ICD-10-CM | POA: Diagnosis present

## 2021-12-27 DIAGNOSIS — J9602 Acute respiratory failure with hypercapnia: Secondary | ICD-10-CM | POA: Diagnosis present

## 2021-12-27 DIAGNOSIS — Z66 Do not resuscitate: Secondary | ICD-10-CM | POA: Diagnosis present

## 2021-12-27 DIAGNOSIS — R627 Adult failure to thrive: Secondary | ICD-10-CM | POA: Diagnosis not present

## 2021-12-27 DIAGNOSIS — Z96651 Presence of right artificial knee joint: Secondary | ICD-10-CM | POA: Diagnosis present

## 2021-12-27 DIAGNOSIS — S92405A Nondisplaced unspecified fracture of left great toe, initial encounter for closed fracture: Secondary | ICD-10-CM | POA: Diagnosis present

## 2021-12-27 DIAGNOSIS — Z20822 Contact with and (suspected) exposure to covid-19: Secondary | ICD-10-CM | POA: Diagnosis present

## 2021-12-27 DIAGNOSIS — J90 Pleural effusion, not elsewhere classified: Secondary | ICD-10-CM | POA: Diagnosis not present

## 2021-12-27 DIAGNOSIS — E785 Hyperlipidemia, unspecified: Secondary | ICD-10-CM | POA: Diagnosis present

## 2021-12-27 DIAGNOSIS — I129 Hypertensive chronic kidney disease with stage 1 through stage 4 chronic kidney disease, or unspecified chronic kidney disease: Secondary | ICD-10-CM | POA: Diagnosis present

## 2021-12-27 DIAGNOSIS — I482 Chronic atrial fibrillation, unspecified: Secondary | ICD-10-CM | POA: Diagnosis present

## 2021-12-27 DIAGNOSIS — J9621 Acute and chronic respiratory failure with hypoxia: Secondary | ICD-10-CM | POA: Diagnosis present

## 2021-12-27 DIAGNOSIS — S92405D Nondisplaced unspecified fracture of left great toe, subsequent encounter for fracture with routine healing: Secondary | ICD-10-CM | POA: Diagnosis not present

## 2021-12-27 DIAGNOSIS — D631 Anemia in chronic kidney disease: Secondary | ICD-10-CM | POA: Diagnosis present

## 2021-12-27 DIAGNOSIS — Z8249 Family history of ischemic heart disease and other diseases of the circulatory system: Secondary | ICD-10-CM

## 2021-12-27 DIAGNOSIS — K7682 Hepatic encephalopathy: Secondary | ICD-10-CM | POA: Diagnosis not present

## 2021-12-27 DIAGNOSIS — K7460 Unspecified cirrhosis of liver: Secondary | ICD-10-CM | POA: Diagnosis present

## 2021-12-27 DIAGNOSIS — F0390 Unspecified dementia without behavioral disturbance: Secondary | ICD-10-CM | POA: Diagnosis present

## 2021-12-27 DIAGNOSIS — J9601 Acute respiratory failure with hypoxia: Secondary | ICD-10-CM | POA: Diagnosis not present

## 2021-12-27 DIAGNOSIS — G9340 Encephalopathy, unspecified: Secondary | ICD-10-CM | POA: Diagnosis present

## 2021-12-27 DIAGNOSIS — D649 Anemia, unspecified: Secondary | ICD-10-CM | POA: Diagnosis present

## 2021-12-27 DIAGNOSIS — F05 Delirium due to known physiological condition: Secondary | ICD-10-CM | POA: Diagnosis not present

## 2021-12-27 DIAGNOSIS — Z833 Family history of diabetes mellitus: Secondary | ICD-10-CM

## 2021-12-27 DIAGNOSIS — N1832 Chronic kidney disease, stage 3b: Secondary | ICD-10-CM | POA: Diagnosis present

## 2021-12-27 DIAGNOSIS — E43 Unspecified severe protein-calorie malnutrition: Secondary | ICD-10-CM | POA: Diagnosis present

## 2021-12-27 DIAGNOSIS — N179 Acute kidney failure, unspecified: Secondary | ICD-10-CM | POA: Diagnosis not present

## 2021-12-27 DIAGNOSIS — Z171 Estrogen receptor negative status [ER-]: Secondary | ICD-10-CM

## 2021-12-27 DIAGNOSIS — Z825 Family history of asthma and other chronic lower respiratory diseases: Secondary | ICD-10-CM

## 2021-12-27 DIAGNOSIS — Z7982 Long term (current) use of aspirin: Secondary | ICD-10-CM

## 2021-12-27 DIAGNOSIS — J181 Lobar pneumonia, unspecified organism: Secondary | ICD-10-CM | POA: Diagnosis present

## 2021-12-27 DIAGNOSIS — Z8701 Personal history of pneumonia (recurrent): Secondary | ICD-10-CM

## 2021-12-27 DIAGNOSIS — C7800 Secondary malignant neoplasm of unspecified lung: Secondary | ICD-10-CM | POA: Diagnosis present

## 2021-12-27 DIAGNOSIS — K219 Gastro-esophageal reflux disease without esophagitis: Secondary | ICD-10-CM | POA: Diagnosis present

## 2021-12-27 DIAGNOSIS — Z515 Encounter for palliative care: Secondary | ICD-10-CM

## 2021-12-27 DIAGNOSIS — Z6831 Body mass index (BMI) 31.0-31.9, adult: Secondary | ICD-10-CM

## 2021-12-27 DIAGNOSIS — C50411 Malignant neoplasm of upper-outer quadrant of right female breast: Secondary | ICD-10-CM | POA: Diagnosis present

## 2021-12-27 DIAGNOSIS — E871 Hypo-osmolality and hyponatremia: Secondary | ICD-10-CM | POA: Diagnosis present

## 2021-12-27 DIAGNOSIS — Z79899 Other long term (current) drug therapy: Secondary | ICD-10-CM

## 2021-12-27 DIAGNOSIS — R059 Cough, unspecified: Secondary | ICD-10-CM | POA: Diagnosis present

## 2021-12-27 DIAGNOSIS — E8729 Other acidosis: Secondary | ICD-10-CM | POA: Diagnosis present

## 2021-12-27 DIAGNOSIS — Z880 Allergy status to penicillin: Secondary | ICD-10-CM

## 2021-12-27 DIAGNOSIS — Z7401 Bed confinement status: Secondary | ICD-10-CM

## 2021-12-27 DIAGNOSIS — I350 Nonrheumatic aortic (valve) stenosis: Secondary | ICD-10-CM | POA: Diagnosis present

## 2021-12-27 DIAGNOSIS — Z806 Family history of leukemia: Secondary | ICD-10-CM

## 2021-12-27 HISTORY — DX: Malignant neoplasm of unspecified site of unspecified female breast: C50.919

## 2021-12-27 HISTORY — DX: Hypomagnesemia: E83.42

## 2021-12-27 HISTORY — DX: Chronic kidney disease, stage 3b: N18.32

## 2021-12-27 HISTORY — DX: Unspecified atrial fibrillation: I48.91

## 2021-12-27 HISTORY — DX: Anemia, unspecified: D64.9

## 2021-12-27 HISTORY — DX: Other ascites: R18.8

## 2021-12-27 LAB — CBC WITH DIFFERENTIAL/PLATELET
Abs Immature Granulocytes: 0.13 10*3/uL — ABNORMAL HIGH (ref 0.00–0.07)
Basophils Absolute: 0.1 10*3/uL (ref 0.0–0.1)
Basophils Relative: 0 %
Eosinophils Absolute: 0.1 10*3/uL (ref 0.0–0.5)
Eosinophils Relative: 1 %
HCT: 36 % (ref 36.0–46.0)
Hemoglobin: 12 g/dL (ref 12.0–15.0)
Immature Granulocytes: 1 %
Lymphocytes Relative: 4 %
Lymphs Abs: 0.6 10*3/uL — ABNORMAL LOW (ref 0.7–4.0)
MCH: 30.7 pg (ref 26.0–34.0)
MCHC: 33.3 g/dL (ref 30.0–36.0)
MCV: 92.1 fL (ref 80.0–100.0)
Monocytes Absolute: 1.4 10*3/uL — ABNORMAL HIGH (ref 0.1–1.0)
Monocytes Relative: 11 %
Neutro Abs: 11.2 10*3/uL — ABNORMAL HIGH (ref 1.7–7.7)
Neutrophils Relative %: 83 %
Platelets: 234 10*3/uL (ref 150–400)
RBC: 3.91 MIL/uL (ref 3.87–5.11)
RDW: 17.2 % — ABNORMAL HIGH (ref 11.5–15.5)
WBC: 13.5 10*3/uL — ABNORMAL HIGH (ref 4.0–10.5)
nRBC: 0.1 % (ref 0.0–0.2)

## 2021-12-27 LAB — COMPREHENSIVE METABOLIC PANEL
ALT: 64 U/L — ABNORMAL HIGH (ref 0–44)
AST: 118 U/L — ABNORMAL HIGH (ref 15–41)
Albumin: 2.6 g/dL — ABNORMAL LOW (ref 3.5–5.0)
Alkaline Phosphatase: 264 U/L — ABNORMAL HIGH (ref 38–126)
Anion gap: 8 (ref 5–15)
BUN: 33 mg/dL — ABNORMAL HIGH (ref 8–23)
CO2: 21 mmol/L — ABNORMAL LOW (ref 22–32)
Calcium: 9.9 mg/dL (ref 8.9–10.3)
Chloride: 100 mmol/L (ref 98–111)
Creatinine, Ser: 1.26 mg/dL — ABNORMAL HIGH (ref 0.44–1.00)
GFR, Estimated: 41 mL/min — ABNORMAL LOW (ref >60)
Glucose, Bld: 115 mg/dL — ABNORMAL HIGH (ref 70–99)
Potassium: 4.4 mmol/L (ref 3.5–5.1)
Sodium: 129 mmol/L — ABNORMAL LOW (ref 135–145)
Total Bilirubin: 0.6 mg/dL (ref 0.3–1.2)
Total Protein: 6.5 g/dL (ref 6.5–8.1)

## 2021-12-27 LAB — RESP PANEL BY RT-PCR (FLU A&B, COVID) ARPGX2
Influenza A by PCR: NEGATIVE
Influenza B by PCR: NEGATIVE
SARS Coronavirus 2 by RT PCR: NEGATIVE

## 2021-12-27 LAB — BLOOD GAS, ARTERIAL
Acid-base deficit: 6.3 mmol/L — ABNORMAL HIGH (ref 0.0–2.0)
Acid-base deficit: 5.6 mmol/L — ABNORMAL HIGH (ref 0.0–2.0)
Bicarbonate: 23.5 mmol/L (ref 20.0–28.0)
Bicarbonate: 21.1 mmol/L (ref 20.0–28.0)
Drawn by: 27016
Drawn by: 38235
FIO2: 36 %
FIO2: 32 %
O2 Saturation: 97.5 %
O2 Saturation: 96.7 %
Patient temperature: 37
Patient temperature: 37
pCO2 arterial: 66 mmHg (ref 32–48)
pCO2 arterial: 45 mmHg (ref 32–48)
pH, Arterial: 7.16 — CL (ref 7.35–7.45)
pH, Arterial: 7.28 — ABNORMAL LOW (ref 7.35–7.45)
pO2, Arterial: 104 mmHg (ref 83–108)
pO2, Arterial: 86 mmHg (ref 83–108)

## 2021-12-27 LAB — URINALYSIS, ROUTINE W REFLEX MICROSCOPIC
Bilirubin Urine: NEGATIVE
Glucose, UA: 50 mg/dL — AB
Ketones, ur: 5 mg/dL — AB
Leukocytes,Ua: NEGATIVE
Nitrite: NEGATIVE
Protein, ur: >=300 mg/dL — AB
Specific Gravity, Urine: 1.022 (ref 1.005–1.030)
pH: 5 (ref 5.0–8.0)

## 2021-12-27 LAB — PROTIME-INR
INR: 1 (ref 0.8–1.2)
Prothrombin Time: 13.2 seconds (ref 11.4–15.2)

## 2021-12-27 LAB — LACTIC ACID, PLASMA: Lactic Acid, Venous: 1.4 mmol/L (ref 0.5–1.9)

## 2021-12-27 LAB — CBG MONITORING, ED: Glucose-Capillary: 100 mg/dL — ABNORMAL HIGH (ref 70–99)

## 2021-12-27 LAB — AMMONIA: Ammonia: 93 umol/L — ABNORMAL HIGH (ref 9–35)

## 2021-12-27 MED ORDER — VANCOMYCIN HCL 1750 MG/350ML IV SOLN
1750.0000 mg | Freq: Once | INTRAVENOUS | Status: AC
  Administered 2021-12-27: 1750 mg via INTRAVENOUS
  Filled 2021-12-27: qty 350

## 2021-12-27 MED ORDER — SODIUM CHLORIDE 0.9 % IV SOLN
2.0000 g | Freq: Two times a day (BID) | INTRAVENOUS | Status: DC
  Administered 2021-12-27: 2 g via INTRAVENOUS
  Filled 2021-12-27 (×2): qty 2

## 2021-12-27 MED ORDER — LACTULOSE ENEMA
300.0000 mL | Freq: Two times a day (BID) | ORAL | Status: DC
  Administered 2021-12-27: 300 mL via RECTAL
  Filled 2021-12-27 (×8): qty 300

## 2021-12-27 MED ORDER — VANCOMYCIN HCL 1250 MG/250ML IV SOLN: 1250.0000 mg | INTRAVENOUS | Status: DC

## 2021-12-27 MED ORDER — LACTULOSE ENEMA
300.0000 mL | Freq: Once | ORAL | Status: AC
  Administered 2021-12-27: 300 mL via RECTAL
  Filled 2021-12-27: qty 300

## 2021-12-27 MED ORDER — DEXTROSE-NACL 5-0.45 % IV SOLN
INTRAVENOUS | Status: DC
  Administered 2021-12-29: 1000 mL via INTRAVENOUS

## 2021-12-27 MED ORDER — LEVOFLOXACIN IN D5W 750 MG/150ML IV SOLN
750.0000 mg | Freq: Once | INTRAVENOUS | Status: AC
  Administered 2021-12-27: 750 mg via INTRAVENOUS
  Filled 2021-12-27: qty 150

## 2021-12-27 MED ORDER — ACETAMINOPHEN 325 MG PO TABS: 650.0000 mg | ORAL_TABLET | Freq: Four times a day (QID) | ORAL | Status: DC | PRN

## 2021-12-27 MED ORDER — ONDANSETRON HCL 4 MG PO TABS: 4.0000 mg | ORAL_TABLET | Freq: Four times a day (QID) | ORAL | Status: DC | PRN

## 2021-12-27 MED ORDER — ACETAMINOPHEN 650 MG RE SUPP: 650.0000 mg | Freq: Four times a day (QID) | RECTAL | Status: DC | PRN

## 2021-12-27 MED ORDER — ONDANSETRON HCL 4 MG/2ML IJ SOLN: 4.0000 mg | Freq: Four times a day (QID) | INTRAMUSCULAR | Status: DC | PRN

## 2021-12-27 MED ORDER — SODIUM CHLORIDE 0.9 % IV SOLN: 2.0000 g | Freq: Three times a day (TID) | INTRAVENOUS | Status: DC

## 2021-12-27 MED ORDER — SODIUM CHLORIDE 0.9 % IV SOLN
500.0000 mg | INTRAVENOUS | Status: DC
  Administered 2021-12-28 – 2021-12-30 (×3): 500 mg via INTRAVENOUS
  Filled 2021-12-27 (×3): qty 5

## 2021-12-27 MED ORDER — HEPARIN SODIUM (PORCINE) 5000 UNIT/ML IJ SOLN
5000.0000 [IU] | Freq: Three times a day (TID) | INTRAMUSCULAR | Status: DC
  Administered 2021-12-27 – 2021-12-30 (×8): 5000 [IU] via SUBCUTANEOUS
  Filled 2021-12-27 (×8): qty 1

## 2021-12-27 NOTE — ED Notes (Signed)
This nurse called ICU to give report x 2, no answer. Will try again later. ?

## 2021-12-27 NOTE — Assessment & Plan Note (Addendum)
-  Creatinine overall stable ?-Nephrotoxic agents were avoided and patient was kept hydrated. ?-Once transition of care to full comfort decided fluids will discontinue no further blood work anticipated to track renal function trend or instability. ?-patient expired at 12:37pm on 01/09/22 ?

## 2021-12-27 NOTE — Assessment & Plan Note (Addendum)
-  With recent hospitalization where she received antibiotic therapy for community-acquired pneumonia ?-patient completed antibiotics length of therapy from previous recommendation. ?-By 01/07/2022 unable to take meds orally again and experiencing obtundation, agonal breathing and increase build up upper airway secretions. ?-comfort measures initiated, scopolamine patch applied and morphine for comfort started. ?-family at bedside on board with transition to comfort care and full symptomatic approach. ?-On 01/07/2022 at 12:37 PM patient peacefully expired. ?

## 2021-12-27 NOTE — Assessment & Plan Note (Addendum)
-   At this moment care has been decided for no further chemotherapy treatment. ?-Hospice referral was requested. ?-patient condition worsen and patient was made full comfort while inpatient. ?-at 12:37pm on 01/01/2022 she peacefully expired. ? ?

## 2021-12-27 NOTE — ED Notes (Signed)
ED Provider at bedside. 

## 2021-12-27 NOTE — Assessment & Plan Note (Addendum)
-  In the setting of malignant effusion most likely. ?-oxygen supplementation provided. ?-After discussing with family decision was made not to pursued thoracentesis or any further invasive treatment. ?-Patient was transition to full comfort care and peacefully expired on 2022-01-21 (12:37 PM). ? ?

## 2021-12-27 NOTE — H&P (Signed)
History and Physical    Patient: [name] TDD:[id number] DOB: Jul 13, 1930 DOA: 01/09/2022 DOS: the patient was seen and examined on 01/01/2022 PCP: [name], MD  Patient coming from: SNF  Chief Complaint:  Chief Complaint  Patient presents with   Altered Mental Status    HPI: [name] is a 88 y.o. female with medical history significant of metastatic right breast cancer, chronic kidney disease a stage IIIb, hyperlipidemia, hypertension, history of atrial fibrillation, chronic cirrhosis, recent admission for pneumonia and underlying dementia; who was sent from the skilled nursing facility secondary to altered mental status.  Despite his underlying dementia patient at baseline is able to communicate, follow commands and take oral medications.  For the last 24 hours she has not been able to do activities as she will normally perform at baseline and is having difficulty being redirected.  Patient was sent to the emergency department for further evaluation and management.  At the facility patient was noticed to be tachypneic; but no productive cough has been reported.  There has not been any complaints of chest pain, fever, nausea, vomiting, melena, hematochezia or any other complaints. In the ED work-up demonstrating bilateral pulmonary opacities with concern for worsening infiltrates versus atelectasis along with right pleural effusion.  CT head without contrast showing no acute intracranial abnormalities.  Patient was found to be hypercapnic and had ammonia level was 93; TRH has been consulted to place in the hospital for further evaluation and management of acute encephalopathy (due to hepatic encephalopathy and hypercapnia).  Review of Systems: As mentioned in the history of present illness. All other systems reviewed and are negative. Past Medical History:  Diagnosis Date   Anemia    Aortic stenosis    Ascites    Atrial fibrillation ([place])    Cancer ([place]) 06/2018   right breast  cancer   Chronic kidney disease, stage 3b (HCC)    Cirrhosis (HCC)    GERD (gastroesophageal reflux disease)    occasional    Hyperlipidemia    Hypertension    clearance with note Dr Nevada Crane on chart   Hypomagnesemia    Malignant neoplasm of unspecified site of unspecified female breast ([place])    Pneumonia    2 years ago/ states occ cough with sinus drainage- no fever   Thyroid nodule    with biopsy- states following medically   Past Surgical History:  Procedure Laterality Date   CATARACT EXTRACTION [name]  11/30/2012   Procedure: CATARACT EXTRACTION PHACO AND INTRAOCULAR LENS PLACEMENT ([place]);  Surgeon: Williams Che, MD;  Location: [facility];  Service: Ophthalmology;  Laterality: Left;  CDE:11.49   CATARACT EXTRACTION W/PHACO Right 03/15/2013   Procedure: CATARACT EXTRACTION PHACO AND INTRAOCULAR LENS PLACEMENT ([facility]);  Surgeon: Williams Che, MD;  Location: [facility];  Service: Ophthalmology;  Laterality: Right;  CDE 16.15   COLONOSCOPY     ESOPHAGOGASTRODUODENOSCOPY (EGD) WITH PROPOFOL N/A 10/07/2020   Dr. 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  Distal esophageal erosion/excoriations overlying varices.  More consistent with trauma/reflux than variceal bleeding stigmata.  Stomach incompletely seen due to presence of old blood.  Moderate sized hiatal hernia.  4 mm antral erosion, innocent appearing.  Repeat EGD in 2 days.   ESOPHAGOGASTRODUODENOSCOPY (EGD) WITH PROPOFOL N/A 10/11/2020   Dr. Laural Golden: Grade 1 varices in the distal esophagus.  LA grade B esophagitis, 1 benign-appearing intrinsic mild stenosis at 34 cm from the incisor, 4 cm hiatal hernia, single  erosion in the gastric antrum and prepyloric region.   EXCISION OF SKIN TAG Right 06/17/2016   Procedure: SHAVE OF SKIN TAG RIGHT BUTTOCK;  Surgeon: Aviva Signs, MD;  Location: [facility];  Service: General;  Laterality: Right;   JOINT REPLACEMENT     left knee   MASS EXCISION Left 06/17/2016    Procedure: EXCISION SKIN MALIGNANT LESION LEFT BUTTOCK;  Surgeon: Aviva Signs, MD;  Location: [facility];  Service: General;  Laterality: Left;   PORTACATH PLACEMENT Right 07/28/2018   Procedure: INSERTION PORT-A-CATH WITH ULTRASOUND;  Surgeon: Rolm Bookbinder, MD;  Location: [place];  Service: General;  Laterality: Right;   PUNCH BIOPSY OF SKIN Right 07/28/2018   Procedure: PUNCH BIOPSY OF SKIN RIGHT BREAST;  Surgeon: Rolm Bookbinder, MD;  Location: [place];  Service: General;  Laterality: Right;   TOTAL KNEE ARTHROPLASTY  12/16/2011   Procedure: TOTAL KNEE ARTHROPLASTY;  Surgeon: Gearlean Alf, MD;  Location: [facility];  Service: Orthopedics;  Laterality: Right;   TUBAL LIGATION     Social History:  reports that she has never smoked. She has never used smokeless tobacco. She reports that she does not drink alcohol and does not use drugs.  Allergies  Allergen Reactions   Penicillins Rash    Has patient had a PCN reaction causing immediate rash, facial/tongue/throat swelling, SOB or lightheadedness with hypotension: No Has patient had a PCN reaction causing severe rash involving mucus membranes or skin necrosis: No Has patient had a PCN reaction that required hospitalization: No Has patient had a PCN reaction occurring within the last 10 years: No If all of the above answers are "NO", then may proceed with Cephalosporin use.     Family History  Problem Relation Age of Onset   Heart attack Mother    Bronchitis Father    Diabetes Sister    Leukemia Brother    Lung disease Brother    Lung disease Sister     Prior to Admission medications   Medication Sig Start Date End Date Taking? Authorizing Provider  aspirin EC 81 MG tablet Take 81 mg by mouth daily. Swallow whole.   Yes [provider]  azithromycin (ZITHROMAX) 500 MG tablet Take 1 tablet (500 mg total) by mouth daily. X 4 days 12/25/21  Yes Tat, Shanon Brow, MD  cefdinir (OMNICEF) 300 MG  capsule Take 1 capsule (300 mg total) by mouth every 12 (twelve) hours. X 4 days 12/25/21  Yes Tat, Shanon Brow, MD  cholecalciferol (VITAMIN D3) 25 MCG (1000 UNIT) tablet Take 1,000 Units by mouth daily.   Yes [provider]  ferrous sulfate 325 (65 FE) MG tablet Take 325 mg by mouth daily with breakfast.   Yes [provider]  lactulose (CHRONULAC) 10 GM/15ML solution Take 30 mLs (20 g total) by mouth 2 (two) times daily. Patient taking differently: Take 20 g by mouth in the morning and at bedtime. 10/12/20  Yes Nita Sells, MD  metoprolol tartrate (LOPRESSOR) 25 MG tablet Take 0.5 tablets (12.5 mg total) by mouth 2 (two) times daily. 08/02/21 01/24/2022 Yes [place] Eric J, DO  Misc Natural Products (ESSIAC TONIC PO) Take 30 mLs by mouth daily.   Yes [provider]  Multiple Vitamin (MULTIVITAMIN WITH MINERALS) TABS tablet Take 1 tablet by mouth daily.   Yes [provider]  traMADol (ULTRAM) 50 MG tablet Take 1 tablet (50 mg total) by mouth every 6 (six) hours as needed for moderate pain. 12/25/21  Yes Tat,  Shanon Brow, MD  zinc gluconate 50 MG tablet Take 50 mg by mouth daily.   Yes [provider]    Physical Exam: Vitals:   01/08/2022 1530 01/22/2022 1600 01/03/2022 1630 01/25/2022 1708  BP: 113/67 125/67 129/66   Pulse: 79 83 77   Resp: (!) 21 (!) 22    Temp:   (!) 97 F (36.1 C) (!) 95.9 F (35.5 C)  TempSrc:   Rectal Rectal  SpO2: 100% 100% 96%   Weight:    82.6 kg  Height:    5\' 4"  (1.626 m)   General exam: Unable to assess appropriately; currently not following commands.  Patient is afebrile. Respiratory system: Positive bilateral rhonchi; no wheezing, no using accessory muscle. Cardiovascular system: Rate controlled; no rubs or gallops. Gastrointestinal system: Abdomen is nondistended, soft and nontender. No organomegaly or masses felt. Normal bowel sounds heard. Central nervous system: Unable to follow commands; difficult to properly assess in  the setting of acute encephalopathic process. Extremities: No clubbing appreciated; there is trace edema seen bilaterally.  Bruising to left great toe and dorsal foot; known recent toe fracture left foot. Skin: No petechiae. Psychiatry: Unable to assess due to acute encephalopathic process.   Data Reviewed: CT scan without acute intracranial abnormalities -Chest x-ray demonstrating bilateral infiltrates/opacities and right pleural effusion. -Ammonia level 93 -ABG demonstrating hypercapnia with a CO2 of 66 and a pH of 7.16 -Negative COVID PCR -Urinalysis not suggestive of UTI. -Normal lactic acid -Comprehensive metabolic panel with a stable chronic hyponatremia, stable creatinine of 1.26; and baseline transaminitis from previous blood work (compatible with chronic cirrhosis changes).  Assessment and Plan: * Acute encephalopathy - In the setting of hypercapnia and hepatic encephalopathy -Ammonia level 93; patient ABG demonstrating respiratory acidosis with a CO2 of 66 -Patient placed on BiPAP; lactulose per rectum recommended. -Follow clinical response and improvement in mentation. -Overall prognosis is poor; case has been discussed with family members who will like patient to remain as comfortable as possible receiving no invasive/heroic intervention.   Pleural effusion - In the setting of malignant effusion most likely. -Will closely follow clinical response and pursued therapeutic/palliative thoracentesis if needed.  Closed nondisplaced fracture of left great toe -As previously seen on recent admission; planning conservative management, fracture stabilization with her own toes. -Patient is bedbound at baseline.  Essential hypertension - Appears to be stable overall. -Holding oral medications at this time. -Follow-up vital signs.  Hyponatremia -Profile associated with chronic renal failure and cirrhosis -Follow electrolytes trend -Sodium 129 currently.  Chronic kidney  disease, stage 3b (HCC) -Creatinine overall stable -Will avoid nephrotoxic agents and provide adequate hydration.  Lobar pneumonia ([place]) -With recent hospitalization where she received antibiotic therapy for community-acquired pneumonia -Covering with Zithromax and cefepime; patient with worsening elevation in her WBCs and some further infiltrate changes on x-ray. -Follow clinical response.  Overall prognosis is poor  Hepatic cirrhosis (HCC) - Overall with poor prognosis. -Continue treatment with lactulose and when able to take by mouth resume the use of PPI and beta-blocker. -Patient was actively followed by GI as an outpatient.  Malignant neoplasm of upper-outer quadrant of right breast in female, estrogen receptor negative ([place]) - At this moment care has been decided for no further chemotherapy treatment. -Per last oncology note recommendation is to pursued hospice care.    Advance Care Planning:   Code Status: DNR   Consults: None  Family Communication: No family at bedside.  Severity of Illness: The appropriate patient status for this patient is  INPATIENT. Inpatient status is judged to be reasonable and necessary in order to provide the required intensity of service to ensure the patient's safety. The patient's presenting symptoms, physical exam findings, and initial radiographic and laboratory data in the context of their chronic comorbidities is felt to place them at high risk for further clinical deterioration. Furthermore, it is not anticipated that the patient will be medically stable for discharge from the hospital within 2 midnights of admission.   * I certify that at the point of admission it is my clinical judgment that the patient will require inpatient hospital care spanning beyond 2 midnights from the point of admission due to high intensity of service, high risk for further deterioration and high frequency of surveillance required.*  Author: Barton Dubois,  MD 01/24/2022 7:15 PM  For on call review [email or web address].

## 2021-12-27 NOTE — ED Notes (Signed)
Evalee Jefferson, PA notified of possible sepsis pt.  ?

## 2021-12-27 NOTE — ED Notes (Signed)
Warm blankets x 3 applied. ?

## 2021-12-27 NOTE — Assessment & Plan Note (Addendum)
-  In the setting of hypercapnia and hepatic encephalopathy ?-Ammonia level down to 32 after providing rectal lactulose and reinitiation of oral lactulose. ?-Mentation has improved, even patient is still experiencing intermittent episodes of lethargy.  (Questionable sundowning). ?-After the use of BiPAP patient hypercapnia process has also resolved ?-No further BiPAP needs appreciated for 24 hours prior to be transferred to the floor. ?-After discussing with family decision was made to transition to full comfort care and anticipating no further use of BiPAP anyways. ?-Patient's condition turn for the worse with increased upper airway secretions, increasing her respiratory rate and once again very confused/lethargy or experiencing-no breathing. ?-Institutional for comfort care and symptomatic management was initiated ?-At 12:37 PM on 2022-01-13 patient peacefully expired. ?-Family was at bedside.. ? ? ?

## 2021-12-27 NOTE — ED Notes (Signed)
Diarrhea stool x 1. Pericare performed with adult diaper and pads changed.  ?

## 2021-12-27 NOTE — Assessment & Plan Note (Addendum)
-  As previously seen on recent admission; planning conservative management, fracture stabilization with her own toes.  Previously discharged with walker camboot in place in order to attempt weightbearing. ?-Patient was essentially bedbound at baseline. ?-after comfort care initiated she peacefully expired on 20-Jan-2022 at 12:37 pm ?

## 2021-12-27 NOTE — Sepsis Progress Note (Signed)
[facility] is monitoring this code sepsis 

## 2021-12-27 NOTE — Assessment & Plan Note (Addendum)
-  Profile associated with chronic renal failure, acute lung infection and cirrhosis ?-sodium remain stable around 129. ?-once transitioned to full comfort care no further blood work pursued. ?

## 2021-12-27 NOTE — ED Triage Notes (Signed)
Pt brought in by [facility] by [facility] with c/o AMS. Son reported to EMS that he was with pt last night and she was "not like this". Son reported to EMS that pt was a DNR but the husband cannot find the paperwork and the facility has her as a full code. Nurse called report from the facility and reported that pt is normally alert, but confused. She normally is able to converse and take her pills whole. This morning she was unable to get pt to talk to her or take pills whole. She attempted to crush the pills and pt would spit them back out. CBG 126, BP 127/52, HR 80 for EMS.  ?

## 2021-12-27 NOTE — Assessment & Plan Note (Addendum)
-  stable overall ?-metoprolol was continued; until patient unable to take PO meds. ?-her care was transitioned to full comfort/symptomatic management and peacefully expired at 12:37 on 01-19-2022. ?

## 2021-12-27 NOTE — ED Provider Notes (Addendum)
[facility] EMERGENCY DEPARTMENT Provider Note   CSN: [id number] Arrival date & time: 01/09/2022  0957     History  Chief Complaint  Patient presents with   Altered Mental Status    [name] is a 88 y.o. female who was discharged from here to skilled nursing 2 days ago, having been diagnosed with lobar pneumonia, currently being treated with p.o. Zithromax and Omnicef.  She has a history of metastatic breast cancer, liver cirrhosis with episodes of GI bleed, severe aortic stenosis, atrial fibrillation presenting for reevaluation given significant altered mental status since yesterday evening.  Apparently the son was visiting the patient yesterday evening at which time she was her baseline self of alert but confused.  This morning she has been less alert and has not been conversing and has refused to take her medications.  Normally she can swallow her pills whole, the nurse tried to crush her pills but the patient continues to spit her medications out, refusing to take them. This is a significant change in patient's behavior.   Level 5 caveat   Discussed with husband [name] regarding code status, given conflicting medical records, currently listed as full code, but was DNR (he signed these papers 12/22).  He wants medical tx, but natural death, no cpr/intubation.   The history is provided by the nursing home and medical records. The history is limited by the condition of the patient.      Home Medications Prior to Admission medications   Medication Sig Start Date End Date Taking? Authorizing Provider  aspirin EC 81 MG tablet Take 81 mg by mouth daily. Swallow whole.   Yes [provider]  azithromycin (ZITHROMAX) 500 MG tablet Take 1 tablet (500 mg total) by mouth daily. X 4 days 12/25/21  Yes Tat, Shanon Brow, MD  cefdinir (OMNICEF) 300 MG capsule Take 1 capsule (300 mg total) by mouth every 12 (twelve) hours. X 4 days 12/25/21  Yes Tat, Shanon Brow, MD  cholecalciferol (VITAMIN D3) 25  MCG (1000 UNIT) tablet Take 1,000 Units by mouth daily.   Yes [provider]  ferrous sulfate 325 (65 FE) MG tablet Take 325 mg by mouth daily with breakfast.   Yes [provider]  lactulose (CHRONULAC) 10 GM/15ML solution Take 30 mLs (20 g total) by mouth 2 (two) times daily. Patient taking differently: Take 20 g by mouth in the morning and at bedtime. 10/12/20  Yes Nita Sells, MD  metoprolol tartrate (LOPRESSOR) 25 MG tablet Take 0.5 tablets (12.5 mg total) by mouth 2 (two) times daily. 08/02/21 12/26/2021 Yes [place] Eric J, DO  Misc Natural Products (ESSIAC TONIC PO) Take 30 mLs by mouth daily.   Yes [provider]  Multiple Vitamin (MULTIVITAMIN WITH MINERALS) TABS tablet Take 1 tablet by mouth daily.   Yes [provider]  traMADol (ULTRAM) 50 MG tablet Take 1 tablet (50 mg total) by mouth every 6 (six) hours as needed for moderate pain. 12/25/21  Yes Tat, Shanon Brow, MD  zinc gluconate 50 MG tablet Take 50 mg by mouth daily.   Yes [provider]  hydrocortisone 1 % ointment Apply 1 application topically 2 (two) times daily. Patient not taking: Reported on 01/09/2022 09/18/18   Glennie Isle, NP-C      Allergies    Penicillins    Review of Systems   Review of Systems  Unable to perform ROS: Mental status change   Physical Exam Updated Vital Signs BP (!) [phone]  Pulse 63    Temp (!) 95.1 F (35.1 C) (Rectal)    Resp 17    Ht 5\' 4"  (1.626 m)    Wt 81 kg    SpO2 99%    BMI 30.65 kg/m  Physical Exam Vitals and nursing note reviewed.  Constitutional:      General: She is in acute distress.     Appearance: She is well-developed. She is obese. She is ill-appearing.  HENT:     Head: Normocephalic and atraumatic.     Mouth/Throat:     Mouth: Mucous membranes are dry.  Eyes:     Conjunctiva/sclera: Conjunctivae normal.     Pupils: Pupils are equal, round, and reactive to light.  Cardiovascular:     Rate and Rhythm: Normal rate and  regular rhythm.     Heart sounds: Normal heart sounds.  Pulmonary:     Effort: Respiratory distress present.     Breath sounds: Rhonchi and rales present. No wheezing.  Abdominal:     General: Bowel sounds are normal. There is distension.     Palpations: Abdomen is soft.     Tenderness: There is no abdominal tenderness. There is no guarding.  Musculoskeletal:        General: Swelling present.     Cervical back: Normal range of motion.     Right lower leg: Edema present.     Left lower leg: Edema present.  Skin:    General: Skin is warm and dry.     Findings: Bruising present.     Comments: Bruising to left great toe and dorsal foot, known recent toe fracture per son at bedside.  Neurological:     Mental Status: She is alert.    ED Results / Procedures / Treatments   Labs (all labs ordered are listed, but only abnormal results are displayed) Labs Reviewed  COMPREHENSIVE METABOLIC PANEL - Abnormal; Notable for the following components:      Result Value   Sodium 129 (*)    CO2 21 (*)    Glucose, Bld 115 (*)    BUN 33 (*)    Creatinine, Ser 1.26 (*)    Albumin 2.6 (*)    AST 118 (*)    ALT 64 (*)    Alkaline Phosphatase 264 (*)    GFR, Estimated 41 (*)    All other components within normal limits  CBC WITH DIFFERENTIAL/PLATELET - Abnormal; Notable for the following components:   WBC 13.5 (*)    RDW 17.2 (*)    Neutro Abs 11.2 (*)    Lymphs Abs 0.6 (*)    Monocytes Absolute 1.4 (*)    Abs Immature Granulocytes 0.13 (*)    All other components within normal limits  URINALYSIS, ROUTINE W REFLEX MICROSCOPIC - Abnormal; Notable for the following components:   APPearance HAZY (*)    Glucose, UA 50 (*)    Hgb urine dipstick SMALL (*)    Ketones, ur 5 (*)    Protein, ur >=300 (*)    Bacteria, UA RARE (*)    All other components within normal limits  AMMONIA - Abnormal; Notable for the following components:   Ammonia 93 (*)    All other components within normal limits   BLOOD GAS, ARTERIAL - Abnormal; Notable for the following components:   pH, Arterial 7.16 (*)    pCO2 arterial 66 (*)    Acid-base deficit 6.3 (*)    All other components within normal limits  CBG MONITORING,  ED - Abnormal; Notable for the following components:   Glucose-Capillary 100 (*)    All other components within normal limits  CULTURE, BLOOD (ROUTINE X 2)  CULTURE, BLOOD (ROUTINE X 2)  MRSA NEXT GEN BY PCR, NASAL  RESP PANEL BY RT-PCR (FLU A&B, COVID) ARPGX2  LACTIC ACID, PLASMA  PROTIME-INR    EKG EKG Interpretation  Date/Time:  Thursday December 27 2021 11:42:32 EST Ventricular Rate:  79 PR Interval:  163 QRS Duration: 100 QT Interval:  402 QTC Calculation: 461 R Axis:   9 Text Interpretation: Sinus rhythm Anterior infarct, old Confirmed by Noemi Chapel [phone]) on 01/03/2022 1:20:06 PM  Radiology DG Chest Port 1 View  Result Date: 01/07/2022 CLINICAL DATA:  Shortness of breath, recent fall EXAM: PORTABLE CHEST 1 VIEW COMPARISON:  Portable exam 1034 hours compared to 12/23/2021 FINDINGS: RIGHT jugular Port-A-Cath with tip projecting over SVC. Enlargement of cardiac silhouette. Pulmonary vascular congestion. Atherosclerotic calcifications aorta. Increased RIGHT pleural effusion and bibasilar opacities question atelectasis versus infiltrate. Increased RIGHT perihilar opacity suspicious for infiltrate. No pneumothorax. IMPRESSION: Enlargement of cardiac silhouette with pulmonary vascular congestion. RIGHT pleural effusion with BILATERAL pulmonary opacities question infiltrate and or atelectasis. Aortic Atherosclerosis (ICD10-I70.0). Electronically Signed   By: Lavonia Dana M.D.   On: 01/22/2022 10:46    Procedures Procedures    Medications Ordered in ED Medications  vancomycin (VANCOREADY) IVPB 1750 mg/350 mL (1,750 mg Intravenous New Bag/Given 01/04/2022 1155)  lactulose (CHRONULAC) enema 200 gm (has no administration in time range)  levofloxacin (LEVAQUIN) IVPB 750 mg (0 mg  Intravenous Stopped 01/21/2022 1314)    ED Course/ Medical Decision Making/ A&P                           Medical Decision Making Patient with known recent community-acquired pneumonia discharged from this hospital 2 days ago to rehab facility.  Returns this morning with altered mental status and tachypnea.  Chest x-ray revealing right significant pleural effusion.  It is unclear if there is worsening pneumonia, this is possible sepsis, sepsis labs have been ordered.  IV antibiotics have also been ordered.  BiPAP given elevation in her CO2 and respiratory distress.  This patient is not a candidate for intubation.  Holding on ordering IV fluids pending further lab results, I am concerned that fluid overloading may make her respiratory status worse.  ABG resulted with hypercarbia.  Ammonia level also elevated suggesting hepatic encephalopathy, pt has refused oral meds this am prior to arrival.  Rectal lactulose ordered.   Patient with multiple reasons for altered mental status including hepatic encephalopathy in combination with hypercarbia.  New right lung pleural effusion in the presence of ongoing treatment for pneumonia.  Patient will require admission.    Blood cultures are pending at this time.  Amount and/or Complexity of Data Reviewed Independent Historian: spouse External Data Reviewed: labs and notes.    Details: prior admission notes and labs reviewed Labs: ordered.    Details: Significant labs today including a hyponatremia with a sodium of 129, she has had some renal insufficiency with a creatinine of 1.26, this is a stable finding.  She does have elevation in her LFTs AST at 118 and ALT at 64, significant elevation in her ammonia level at 93, this was 23 1 week ago.  Her ABG reflects PCO2 of 66.  She has a normal lactic acid initially.  She does have an elevated WBC count of 13.5 which is elevated since  her hospitalization. Radiology: ordered.    Details: Chest x-ray showing a new right  pleural effusion, along with bilateral pulmonary opacities, raising the concern of possible worsening pneumonia despite recent IV antibiotics and current oral antibiotic regimen. Discussion of management or test interpretation with external provider(s): Discussed with Dr. Dyann Kief of the hospitalist service who will plan to consult and admit patient.  Risk Prescription drug management. Decision regarding hospitalization.      CRITICAL CARE Performed by: Evalee Jefferson Total critical care time: 50 minutes Critical care time was exclusive of separately billable procedures and treating other patients. Critical care was necessary to treat or prevent imminent or life-threatening deterioration. Critical care was time spent personally by me on the following activities: development of treatment plan with patient and/or surrogate as well as nursing, discussions with consultants, evaluation of patient's response to treatment, examination of patient, obtaining history from patient or surrogate, ordering and performing treatments and interventions, ordering and review of laboratory studies, ordering and review of radiographic studies, pulse oximetry and re-evaluation of patient's condition.      Final Clinical Impression(s) / ED Diagnoses Final diagnoses:  Hepatic encephalopathy  Acute respiratory failure with hypoxia and hypercarbia (HCC)  Pleural effusion    Rx / DC Orders ED Discharge Orders     None         Landis Martins 01/09/2022 1308    Evalee Jefferson, PA-C 01/07/2022 1309    Evalee Jefferson, PA-C 01/22/2022 1326    Noemi Chapel, MD 01/14/2022 [phone]

## 2021-12-27 NOTE — ED Notes (Signed)
Left 1st and 2nd toes buddy taped. Left lateral foot and first toe are bruised. ?

## 2021-12-27 NOTE — Assessment & Plan Note (Addendum)
-  Overall with poor prognosis. ?-by January 15, 2022 unable to take meds orally again and experiencing obtundation, agonal breathing and increase build up upper airway secretions. ?-comfort measures initiated, scopolamine patch applied and morphine for comfort started. ?-family at bedside on board with transition to comfort care and full symptomatic approach. ?-On 2022-01-15 at 12:37 PM patient peacefully expired. ? ? ?

## 2021-12-27 NOTE — Progress Notes (Addendum)
Pharmacy Antibiotic Note ? ?[name] a 88 y.o. female admitted on 01/23/2022 with pneumonia.  Pharmacy has been consulted for vancomycin dosing. ? ?Plan: ?Vancomycin 1250mg  IV every 24 hours.  Goal trough 15-20 mcg/mL. ? ?Medical History: ?Past Medical History:  ?Diagnosis Date  ? Anemia   ? Aortic stenosis   ? Ascites   ? Atrial fibrillation ([place])   ? Cancer ([place]) 06/2018  ? right breast cancer  ? Chronic kidney disease, stage 3b ([place])   ? Cirrhosis ([place])   ? GERD (gastroesophageal reflux disease)   ? occasional   ? Hyperlipidemia   ? Hypertension   ? clearance with note Dr Nevada Crane on chart  ? Hypomagnesemia   ? Malignant neoplasm of unspecified site of unspecified female breast ([place])   ? Pneumonia   ? 2 years ago/ states occ cough with sinus drainage- no fever  ? Thyroid nodule   ? with biopsy- states following medically  ? ? ?Allergies:  ?Allergies  ?Allergen Reactions  ? Penicillins Rash  ?  Has patient had a PCN reaction causing immediate rash, facial/tongue/throat swelling, SOB or lightheadedness with hypotension: No ?Has patient had a PCN reaction causing severe rash involving mucus membranes or skin necrosis: No ?Has patient had a PCN reaction that required hospitalization: No ?Has patient had a PCN reaction occurring within the last 10 years: No ?If all of the above answers are "NO", then may proceed with Cephalosporin use. ?  ? ? ?Filed Weights  ? 01/21/2022 1004  ?Weight: 81 kg (178 lb 9.2 oz)  ? ? ?CBC Latest Ref Rng & Units 01/19/2022 12/25/2021 12/24/2021  ?WBC 4.0 - 10.5 K/uL 13.5(H) 9.2 10.1  ?Hemoglobin 12.0 - 15.0 g/dL 12.0 11.9(L) 11.9(L)  ?Hematocrit 36.0 - 46.0 % 36.0 35.9(L) 36.4  ?Platelets 150 - 400 K/uL 234 243 212  ? ? ? ?Estimated Creatinine Clearance: 31.2 mL/min (A) (by C-G formula based on SCr of 1.26 mg/dL (H)). ? ?Antibiotics Given (last 72 hours)   ? ? Date/Time Action Medication Dose Rate  ? 01/18/2022 1138 New Bag/Given  ? levofloxacin (LEVAQUIN) IVPB 750 mg 750 mg 100 mL/hr  ? 12/29/2021  1155 New Bag/Given  ? vancomycin (VANCOREADY) IVPB 1750 mg/350 mL 1,750 mg 175 mL/hr  ? ?  ? ? ?Antimicrobials this admission: ? ?vancomycin 01/16/2022  >>  ?Levofloxacin 01/01/2022 x 1 ? ?Microbiology results: ?01/23/2022  BCx: sent ?12/26/2021  Resp Panel: sent  ?01/19/2022  MRSA PCR: sent ? ?Thank you for allowing pharmacy to be a part of this patient?s care. ? ?Thomasenia Sales, PharmD ?Clinical Pharmacist ? ? ?

## 2021-12-28 DIAGNOSIS — N1832 Chronic kidney disease, stage 3b: Secondary | ICD-10-CM | POA: Diagnosis not present

## 2021-12-28 DIAGNOSIS — J9601 Acute respiratory failure with hypoxia: Secondary | ICD-10-CM | POA: Diagnosis not present

## 2021-12-28 DIAGNOSIS — K7682 Hepatic encephalopathy: Secondary | ICD-10-CM | POA: Diagnosis not present

## 2021-12-28 DIAGNOSIS — G9340 Encephalopathy, unspecified: Secondary | ICD-10-CM | POA: Diagnosis not present

## 2021-12-28 LAB — BASIC METABOLIC PANEL
Anion gap: 10 (ref 5–15)
BUN: 39 mg/dL — ABNORMAL HIGH (ref 8–23)
CO2: 18 mmol/L — ABNORMAL LOW (ref 22–32)
Calcium: 9.4 mg/dL (ref 8.9–10.3)
Chloride: 104 mmol/L (ref 98–111)
Creatinine, Ser: 1.48 mg/dL — ABNORMAL HIGH (ref 0.44–1.00)
GFR, Estimated: 34 mL/min — ABNORMAL LOW (ref >60)
Glucose, Bld: 90 mg/dL (ref 70–99)
Potassium: 3.6 mmol/L (ref 3.5–5.1)
Sodium: 132 mmol/L — ABNORMAL LOW (ref 135–145)

## 2021-12-28 LAB — BLOOD GAS, ARTERIAL
Acid-base deficit: 5.6 mmol/L — ABNORMAL HIGH (ref 0.0–2.0)
Bicarbonate: 19.3 mmol/L — ABNORMAL LOW (ref 20.0–28.0)
Drawn by: 38235
FIO2: 32 %
O2 Saturation: 98.2 %
Patient temperature: 37
pCO2 arterial: 35 mmHg (ref 32–48)
pH, Arterial: 7.35 (ref 7.35–7.45)
pO2, Arterial: 80 mmHg — ABNORMAL LOW (ref 83–108)

## 2021-12-28 LAB — CBC
HCT: 31.6 % — ABNORMAL LOW (ref 36.0–46.0)
Hemoglobin: 10.7 g/dL — ABNORMAL LOW (ref 12.0–15.0)
MCH: 30.7 pg (ref 26.0–34.0)
MCHC: 33.9 g/dL (ref 30.0–36.0)
MCV: 90.8 fL (ref 80.0–100.0)
Platelets: 183 10*3/uL (ref 150–400)
RBC: 3.48 MIL/uL — ABNORMAL LOW (ref 3.87–5.11)
RDW: 17.2 % — ABNORMAL HIGH (ref 11.5–15.5)
WBC: 9.1 10*3/uL (ref 4.0–10.5)
nRBC: 0 % (ref 0.0–0.2)

## 2021-12-28 LAB — AMMONIA: Ammonia: 58 umol/L — ABNORMAL HIGH (ref 9–35)

## 2021-12-28 LAB — STREP PNEUMONIAE URINARY ANTIGEN: Strep Pneumo Urinary Antigen: NEGATIVE

## 2021-12-28 LAB — PHOSPHORUS: Phosphorus: 2.9 mg/dL (ref 2.5–4.6)

## 2021-12-28 LAB — MAGNESIUM: Magnesium: 1.9 mg/dL (ref 1.7–2.4)

## 2021-12-28 LAB — MRSA NEXT GEN BY PCR, NASAL: MRSA by PCR Next Gen: NOT DETECTED

## 2021-12-28 MED ORDER — TRAMADOL HCL 50 MG PO TABS
50.0000 mg | ORAL_TABLET | Freq: Three times a day (TID) | ORAL | Status: DC | PRN
Start: 2021-12-28 — End: 2021-12-30
  Administered 2021-12-28 – 2021-12-29 (×2): 50 mg via ORAL
  Filled 2021-12-28 (×3): qty 1

## 2021-12-28 MED ORDER — METOPROLOL TARTRATE 25 MG PO TABS
12.5000 mg | ORAL_TABLET | Freq: Two times a day (BID) | ORAL | Status: DC
Start: 2021-12-28 — End: 2021-12-30
  Administered 2021-12-28 – 2021-12-29 (×3): 12.5 mg via ORAL
  Filled 2021-12-28 (×4): qty 1

## 2021-12-28 MED ORDER — SODIUM CHLORIDE 0.9 % IV SOLN
2.0000 g | INTRAVENOUS | Status: DC
  Administered 2021-12-28 – 2021-12-29 (×2): 2 g via INTRAVENOUS
  Filled 2021-12-28 (×2): qty 2

## 2021-12-28 MED ORDER — CHLORHEXIDINE GLUCONATE CLOTH 2 % EX PADS
6.0000 | MEDICATED_PAD | Freq: Every day | CUTANEOUS | Status: DC
  Administered 2021-12-29: 6 via TOPICAL

## 2021-12-28 MED ORDER — MELATONIN 3 MG PO TABS
6.0000 mg | ORAL_TABLET | Freq: Every day | ORAL | Status: DC
  Administered 2021-12-28: 6 mg via ORAL
  Filled 2021-12-28 (×2): qty 2

## 2021-12-28 MED ORDER — LACTULOSE 10 GM/15ML PO SOLN
20.0000 g | Freq: Two times a day (BID) | ORAL | Status: DC
  Administered 2021-12-28 – 2021-12-29 (×3): 20 g via ORAL
  Filled 2021-12-28 (×4): qty 30

## 2021-12-28 NOTE — Progress Notes (Addendum)
?  Transition of Care (TOC) Screening Note ? ? ?Patient Details  ?Name: [name] ?Date of Birth: [birth year] ? ? ?Transition of Care (TOC) CM/SW Contact:    ?Boneta Lucks, RN ?Phone Number: ?12/28/2021, 11:20 AM ? ?Patient from [facility].  Per Ebony Hail patient will need PT eval and INS Auth to return. MD ordering PT. ? ?Addendum:  MD has palliative discussion with Family. They will discuss Residential Hospice as a family and let him know tomorrow. TOC to follow for DC plan. ? ?Transition of Care Department [facility]) has reviewed patient and no TOC needs have been identified at this time. We will continue to monitor patient advancement through interdisciplinary progression rounds. If new patient transition needs arise, please place a TOC consult. ? ? ?

## 2021-12-28 NOTE — Progress Notes (Signed)
?Progress Note ? ? ?Patient: [name] YIF:[id number] DOB: [birth year] DOA: 01/04/2022     1 ?DOS: the patient was seen and examined on 12/28/2021 ?  ?Brief hospital admission narrative course: ?[name] is a 88 y.o. female with medical history significant of metastatic right breast cancer, chronic kidney disease a stage IIIb, hyperlipidemia, hypertension, history of atrial fibrillation, chronic cirrhosis, recent admission for pneumonia and underlying dementia; who was sent from the skilled nursing facility secondary to altered mental status.  Despite his underlying dementia patient at baseline is able to communicate, follow commands and take oral medications.  For the last 24 hours she has not been able to do activities as she will normally perform at baseline and is having difficulty being redirected.  Patient was sent to the emergency department for further evaluation and management.  At the facility patient was noticed to be tachypneic; but no productive cough has been reported.  There has not been any complaints of chest pain, fever, nausea, vomiting, melena, hematochezia or any other complaints. ?In the ED work-up demonstrating bilateral pulmonary opacities with concern for worsening infiltrates versus atelectasis along with right pleural effusion.  CT head without contrast showing no acute intracranial abnormalities.  Patient was found to be hypercapnic and had ammonia level was 93; TRH has been consulted to place in the hospital for further evaluation and management of acute encephalopathy (due to hepatic encephalopathy and hypercapnia). ? ?Assessment and Plan: ?* Acute encephalopathy ?-In the setting of hypercapnia and hepatic encephalopathy ?-Ammonia level down to 53 after providing rectal lactulose ?-Repeat ABGs demonstrating resolution of respiratory acidosis and normalization of hypercapnia process. ?-Patient with good saturation on 3 L; will pursuit only 2 L saturation to allow her body to eliminate  and maintain CO2 on her own. ?-Long discussion with patient's daughter at bedside regarding poor prognosis and current condition. ?-Family will get together and discuss the possibility for residential hospice.   ?-We will advance diet to dysphagia 3 and make medications by mouth as tolerated.   ?-Continue to follow clinical response.  ?-CODE STATUS was clarified with patient's daughter and is confirmed no heroic interventions or invasive management. ? ? ?Pleural effusion ?-In the setting of malignant effusion most likely. ?-Will closely follow clinical response and pursued therapeutic/palliative thoracentesis if needed. ?-Currently looking for transition into comfort care management and hospice placement. ? ?Closed nondisplaced fracture of left great toe ?-As previously seen on recent admission; planning conservative management, fracture stabilization with her own toes.  Previously discharged with walker camboot in place in order to attempt weightbearing. ?-Patient is bedbound at baseline. ? ?Essential hypertension ?-Appears to be stable overall. ?-Resuming some of her oral medications as long as she is able to take them by mouth; specially metoprolol. ?-Follow-up vital signs. ? ?Hyponatremia ?-Profile associated with chronic renal failure and cirrhosis ?-Follow electrolytes trend ?-Sodium has remained stable and is 129 currently. ? ?Chronic kidney disease, stage 3b ([place]) ?-Creatinine overall stable ?-Will avoid nephrotoxic agents and provide adequate hydration. ?-Continue to follow trend intermittently while finalizing decision to transition care to comfort. ? ?Lobar pneumonia ([place]) ?-With recent hospitalization where she received antibiotic therapy for community-acquired pneumonia ?-Covering with Zithromax and cefepime; patient with worsening elevation in her WBCs and some further infiltrate changes on x-ray. ?-Follow clinical response.  Overall prognosis is poor and condition guarded. ?-Family discussing for  potential transition to comfort care and pursuing residential hospice placement. ? ?Hepatic cirrhosis ([place]) ?-Overall with poor prognosis. ?-Continue treatment with lactulose and  when able to take by mouth resume the use of PPI and beta-blocker. ?-Patient was actively followed by GI as an outpatient. ?-Patient condition deteriorating/declining and very appropriate for hospice management. ? ?Malignant neoplasm of upper-outer quadrant of right breast in female, estrogen receptor negative ([place]) ?- At this moment care has been decided for no further chemotherapy treatment. ?-Per last oncology note, recommendation is to pursued hospice care. ?-Family is aware and in agreement. ?-Will explore option for residential hospice. ? ? ? ?Subjective:  ?Good response in patient mentation after using BiPAP overnight and providing rectal lactulose.  Patient currently following commands asking for something to eat/drink and complaining of dry mouth.  No chest pain, no fever, no nausea or vomiting.  Good oxygen saturation on 3 L Account supplementation. ? ?Physical Exam: ?Vitals:  ? 12/28/21 0600 12/28/21 0700 12/28/21 0800 12/28/21 1200  ?BP: (!) 145/70 (!) 136/59 (!) 143/103   ?Pulse: (!) 103 99 (!) 121   ?Resp: (!) 22 20 (!) 26   ?Temp:    (!) 97.5 ?F (36.4 ?C)  ?TempSrc:    Axillary  ?SpO2: 98% 97% (!) 87%   ?Weight:      ?Height:      ? ?General exam: Alert, awake, oriented x 2; to follow commands appropriately and expressing to be hungry and having dry mouth.   ?Respiratory system: Decreased breath sounds at the bases; positive scattered rhonchi.  No using accessory muscle.  Good saturation on 3 L nasal cannula supplementation. ?Cardiovascular system: Sinus tachycardia, no rubs, no gallops, no JVD ?Gastrointestinal system: Abdomen is nondistended, soft and nontender. No organomegaly or masses felt. Normal bowel sounds heard. ?Central nervous system:  No focal neurological deficits. ?Extremities: No cyanosis or clubbing; left  foot with process injured left great toe and dorsal aspect from previous known fracture. ?Skin: No petechiae. ?Psychiatry: Judgement and insight appear normal. Mood & affect appropriate.  ? ? ?Data Reviewed: ?Ammonia level 53 ?-ABG demonstrating resolution of hypercapnia and a stabilization in patient's present on admission respiratory acidosis.Marland Kitchen ? ?Family Communication: Daughter at bedside ? ?Disposition: ?Status is: Inpatient ? ?Remains inpatient appropriate because: Still experiencing intermittent episodes of altered mentation and shortness of breath. ? ? ? ? Planned Discharge Destination: To be determined; currently discussing possibility of residential hospice placement.  Patient was admitted from [facility] rehabilitation center ( skilled nursing facility). ? ? ?Author: ?Barton Dubois, MD ?12/28/2021 4:43 PM ? ?For on call review [email or web address].  ? ?

## 2021-12-28 NOTE — Progress Notes (Signed)
Patient somewhat confused and unable to take CPAP off if needed. RN aware. ?

## 2021-12-28 NOTE — Care Management Important Message (Signed)
Important Message ? ?Patient Details  ?Name: [name] ?MRN: [id number] ?Date of Birth: 1930/03/22 ? ? ?Medicare Important Message Given:  Yes ? ? ? ? ?Tommy Medal ?12/28/2021, 4:21 PM ?

## 2021-12-29 DIAGNOSIS — J9601 Acute respiratory failure with hypoxia: Secondary | ICD-10-CM | POA: Diagnosis not present

## 2021-12-29 DIAGNOSIS — N1832 Chronic kidney disease, stage 3b: Secondary | ICD-10-CM | POA: Diagnosis not present

## 2021-12-29 DIAGNOSIS — G9340 Encephalopathy, unspecified: Secondary | ICD-10-CM | POA: Diagnosis not present

## 2021-12-29 DIAGNOSIS — K7682 Hepatic encephalopathy: Secondary | ICD-10-CM | POA: Diagnosis not present

## 2021-12-29 LAB — BASIC METABOLIC PANEL
Anion gap: 8 (ref 5–15)
BUN: 40 mg/dL — ABNORMAL HIGH (ref 8–23)
CO2: 19 mmol/L — ABNORMAL LOW (ref 22–32)
Calcium: 10 mg/dL (ref 8.9–10.3)
Chloride: 107 mmol/L (ref 98–111)
Creatinine, Ser: 1.65 mg/dL — ABNORMAL HIGH (ref 0.44–1.00)
GFR, Estimated: 30 mL/min — ABNORMAL LOW (ref >60)
Glucose, Bld: 123 mg/dL — ABNORMAL HIGH (ref 70–99)
Potassium: 3.9 mmol/L (ref 3.5–5.1)
Sodium: 134 mmol/L — ABNORMAL LOW (ref 135–145)

## 2021-12-29 LAB — AMMONIA: Ammonia: 32 umol/L (ref 9–35)

## 2021-12-29 MED ORDER — ORAL CARE MOUTH RINSE
15.0000 mL | Freq: Two times a day (BID) | OROMUCOSAL | Status: DC
  Administered 2021-12-29 – 2021-12-30 (×3): 15 mL via OROMUCOSAL

## 2021-12-29 MED ORDER — CHLORHEXIDINE GLUCONATE 0.12 % MT SOLN
15.0000 mL | Freq: Two times a day (BID) | OROMUCOSAL | Status: DC
  Administered 2021-12-29 (×2): 15 mL via OROMUCOSAL
  Filled 2021-12-29: qty 15

## 2021-12-29 MED ORDER — MORPHINE SULFATE (PF) 2 MG/ML IV SOLN
2.0000 mg | Freq: Once | INTRAVENOUS | Status: AC
  Administered 2021-12-29: 2 mg via INTRAVENOUS
  Filled 2021-12-29: qty 1

## 2021-12-29 NOTE — Progress Notes (Signed)
?Progress Note ? ? ?Patient: [name] JGG:[id number] DOB: 05-21-1930 DOA: 12/26/2021     2 ?DOS: the patient was seen and examined on 12/29/2021 ?  ?Brief hospital admission narrative course: ?BRAYLON LEMMONS is a 88 y.o. female with medical history significant of metastatic right breast cancer, chronic kidney disease a stage IIIb, hyperlipidemia, hypertension, history of atrial fibrillation, chronic cirrhosis, recent admission for pneumonia and underlying dementia; who was sent from the skilled nursing facility secondary to altered mental status.  Despite his underlying dementia patient at baseline is able to communicate, follow commands and take oral medications.  For the last 24 hours she has not been able to do activities as she will normally perform at baseline and is having difficulty being redirected.  Patient was sent to the emergency department for further evaluation and management.  At the facility patient was noticed to be tachypneic; but no productive cough has been reported.  There has not been any complaints of chest pain, fever, nausea, vomiting, melena, hematochezia or any other complaints. ?In the ED work-up demonstrating bilateral pulmonary opacities with concern for worsening infiltrates versus atelectasis along with right pleural effusion.  CT head without contrast showing no acute intracranial abnormalities.  Patient was found to be hypercapnic and had ammonia level was 93; TRH has been consulted to place in the hospital for further evaluation and management of acute encephalopathy (due to hepatic encephalopathy and hypercapnia). ? ?Assessment and Plan: ?* Acute encephalopathy ?-In the setting of hypercapnia and hepatic encephalopathy ?-Ammonia level down to 32 after providing rectal lactulose and reinitiation of oral lactulose. ?-Mentation has improved, even patient is still experiencing intermittent episodes of lethargy.  (Questionable sundowning). ?-After the use of BiPAP patient hypercapnia  process has also resolved ?-No further BiPAP needs appreciated in the last 24 hours. ?-After long discussion with family members will complete 2 more days of antibiotics and transition care to comfort.  No anticipated usage of BiPAP or any other invasive therapy moving forward. ?-Will transfer patient to MedSurg bed. ?-TOC has been made aware of hospice referral. ? ?Pleural effusion ?-In the setting of malignant effusion most likely. ?-Complete treatment with antibiotics ?-Continue oxygen supplementation and supportive care. ?-No plan for invasive therapy or thoracentesis after discussing with family. ?-Transition to hospice/comfort care in the next 36 hours (after completing antibiotic treatment). ? ? ?Closed nondisplaced fracture of left great toe ?-As previously seen on recent admission; planning conservative management, fracture stabilization with her own toes.  Previously discharged with walker camboot in place in order to attempt weightbearing. ?-Patient is essentially bedbound at baseline. ?-After discussing with family planning to transition to comfort care in the next 36-40 hours.  TOC has been made aware. ? ?Essential hypertension ?-Appears to be stable overall. ?-Continue metoprolol. ?-Follow-up vital signs. ? ?Hyponatremia ?-Profile associated with chronic renal failure and cirrhosis ?-Follow electrolytes trend ?-Sodium has remained stable and is 129 currently. ? ?Chronic kidney disease, stage 3b ([place]) ?-Creatinine overall stable ?-Will avoid nephrotoxic agents and provide adequate hydration. ?-Planning for transition to hospice care in the next 36/40 hours. ? ?Lobar pneumonia ([place]) ?-With recent hospitalization where she received antibiotic therapy for community-acquired pneumonia ?-Covering with Zithromax and cefepime; patient with worsening elevation in her WBCs and some further infiltrate changes on x-ray. ?-After discussing with family members will complete 2 more days of antibiotics to complete  therapy and transition to hospice care. ? ?Hepatic cirrhosis ([place]) ?-Overall with poor prognosis. ?-Continue treatment with lactulose and when able to take  by mouth resume the use of PPI and beta-blocker. ?-Patient was actively followed by GI as an outpatient. ?-Patient condition deteriorating/declining and very appropriate for hospice management. ? ?Malignant neoplasm of upper-outer quadrant of right breast in female, estrogen receptor negative ([place]) ?- At this moment care has been decided for no further chemotherapy treatment. ?-Per last oncology note, recommendation is to pursued hospice care. ?-Family is aware and in agreement. ?-Hospice referral requested. ? ? ? ? ?Subjective:  ?Patient has remained without needing BiPAP and mentation showing some improvement after stabilizing ammonia level and hypercapnia.  Intermittent lethargy appreciated.  Decreased oral intake.  Chronically ill, deconditioned and in no major distress. ? ?Physical Exam: ?Vitals:  ? 12/29/21 0900 12/29/21 1000 12/29/21 1100 12/29/21 1200  ?BP: (!) 134/51 (!) 126/54 128/61 (!) 132/52  ?Pulse: 88 85 89 94  ?Resp: 20 (!) 23 20 (!) 24  ?Temp:   (!) 96.7 ?F (35.9 ?C)   ?TempSrc:   Axillary   ?SpO2: 98% 97% 99% 97%  ?Weight:      ?Height:      ? ?General exam: Alert, awake, oriented x 1; able to follow simple commands and to her medication by mouth.  Oral intake minimal. ?Respiratory system: Positive scattered rhonchi it, no using accessory muscle.  Good saturation on 2 L nasal cannula supplementation. ?Cardiovascular system: Rate controlled, no rubs, no gallops, no JVD on exam. ?Gastrointestinal system: Abdomen is nondistended, soft and nontender. No organomegaly or masses felt. Normal bowel sounds heard. ?Central nervous system: No new focal deficits. ?Extremities: No cyanosis or clubbing. ?Skin: No petechiae. ?Psychiatry: Mood & affect appropriate.  ? ? ? ?Data Reviewed: ?Ammonia level 32 ?-Sodium level 134, creatinine 165 normal  electrolytes. ? ?Family Communication: Daughter at bedside and patient's son over the phone. ? ?Disposition: ?Status is: Inpatient ? ?Remains inpatient appropriate because: Still experiencing intermittent episodes of altered mentation and shortness of breath. ? ?Planned Discharge Destination: Family wanting to pursued residential hospice placement.  TOC has been made aware for referral. ? ? ?Author: ?Barton Dubois, MD ?12/29/2021 2:57 PM ? ?For on call review [email or web address].  ? ?

## 2021-12-29 NOTE — Progress Notes (Signed)
Pt resting in brief intervals, awakens and begans moaning. Responsive to tactile stimuli, moaning as well with turning and repositioning. Family at bedside, requesting medication to be given IV to help promote rest. Dr. Clearence Ped notified, received new order for morphine '2mg'$  IV x1. Med administered as ordered, will monitor for effectiveness. Pt unable to take any of her PO medications due to decline in status and inability to follow directions to swallow. Dr. Clearence Ped made aware as well.  ?

## 2021-12-30 DIAGNOSIS — R627 Adult failure to thrive: Secondary | ICD-10-CM

## 2021-12-30 DIAGNOSIS — N1832 Chronic kidney disease, stage 3b: Secondary | ICD-10-CM | POA: Diagnosis not present

## 2021-12-30 DIAGNOSIS — K7682 Hepatic encephalopathy: Secondary | ICD-10-CM | POA: Diagnosis not present

## 2021-12-30 DIAGNOSIS — J9601 Acute respiratory failure with hypoxia: Secondary | ICD-10-CM | POA: Diagnosis not present

## 2021-12-30 DIAGNOSIS — G9340 Encephalopathy, unspecified: Secondary | ICD-10-CM | POA: Diagnosis not present

## 2021-12-30 MED ORDER — LORAZEPAM 1 MG PO TABS: 1.0000 mg | ORAL_TABLET | ORAL | Status: DC | PRN

## 2021-12-30 MED ORDER — GLYCOPYRROLATE 0.2 MG/ML IJ SOLN: 0.2000 mg | INTRAMUSCULAR | Status: DC | PRN

## 2021-12-30 MED ORDER — BIOTENE DRY MOUTH MT LIQD: 15.0000 mL | OROMUCOSAL | Status: DC | PRN

## 2021-12-30 MED ORDER — HALOPERIDOL LACTATE 5 MG/ML IJ SOLN: 0.5000 mg | INTRAMUSCULAR | Status: DC | PRN

## 2021-12-30 MED ORDER — ACETAMINOPHEN 650 MG RE SUPP: 650.0000 mg | Freq: Four times a day (QID) | RECTAL | Status: DC | PRN

## 2021-12-30 MED ORDER — HALOPERIDOL 0.5 MG PO TABS: 0.5000 mg | ORAL_TABLET | ORAL | Status: DC | PRN

## 2021-12-30 MED ORDER — ONDANSETRON HCL 4 MG/2ML IJ SOLN: 4.0000 mg | Freq: Four times a day (QID) | INTRAMUSCULAR | Status: DC | PRN

## 2021-12-30 MED ORDER — SCOPOLAMINE 1 MG/3DAYS TD PT72: 1.0000 | MEDICATED_PATCH | TRANSDERMAL | Status: DC

## 2021-12-30 MED ORDER — GLYCOPYRROLATE 1 MG PO TABS: 1.0000 mg | ORAL_TABLET | ORAL | Status: DC | PRN

## 2021-12-30 MED ORDER — ACETAMINOPHEN 325 MG PO TABS: 650.0000 mg | ORAL_TABLET | Freq: Four times a day (QID) | ORAL | Status: DC | PRN

## 2021-12-30 MED ORDER — POLYVINYL ALCOHOL 1.4 % OP SOLN: 1.0000 [drp] | Freq: Four times a day (QID) | OPHTHALMIC | Status: DC | PRN

## 2021-12-30 MED ORDER — LORAZEPAM 2 MG/ML IJ SOLN: 1.0000 mg | INTRAMUSCULAR | Status: DC | PRN

## 2021-12-30 MED ORDER — LORAZEPAM 2 MG/ML PO CONC: 1.0000 mg | ORAL | Status: DC | PRN

## 2021-12-30 MED ORDER — MORPHINE SULFATE (PF) 2 MG/ML IV SOLN
1.0000 mg | INTRAVENOUS | Status: DC | PRN
  Administered 2021-12-30 (×2): 1 mg via INTRAVENOUS
  Filled 2021-12-30 (×2): qty 1

## 2021-12-30 MED ORDER — ONDANSETRON 4 MG PO TBDP
4.0000 mg | ORAL_TABLET | Freq: Four times a day (QID) | ORAL | Status: DC | PRN
Start: 2021-12-30 — End: 2021-12-30

## 2021-12-30 MED ORDER — HALOPERIDOL LACTATE 2 MG/ML PO CONC: 0.5000 mg | ORAL | Status: DC | PRN

## 2022-01-01 LAB — CULTURE, BLOOD (ROUTINE X 2)
Culture: NO GROWTH
Culture: NO GROWTH
Special Requests: ADEQUATE

## 2022-01-01 LAB — LEGIONELLA PNEUMOPHILA SEROGP 1 UR AG: L. pneumophila Serogp 1 Ur Ag: NEGATIVE

## 2022-01-02 ENCOUNTER — Ambulatory Visit: Payer: Medicare HMO | Admitting: Gastroenterology

## 2022-01-26 NOTE — Death Summary Note (Signed)
DEATH SUMMARY   Patient Details  Name: CLEO SANTUCCI MRN: [id number] DOB: 1930-08-10 [id number]:[id number], Edwinna Areola, MD  Admission/Discharge Information   Admit Date:  01/08/2022  Date of Death: Date of Death: 2022-01-11  Time of Death: Time of Death: Jan 22, 1236  Length of Stay: 3   Principle Cause of death: Postobstructive pneumonia; hepatic encephalopathy and acute on chronic respiratory failure with hypoxia and hypercapnia.   Hospital Diagnoses: Principal Problem:   Acute encephalopathy Active Problems:   Malignant neoplasm of upper-outer quadrant of right breast in female, estrogen receptor negative (HCC)   Hepatic cirrhosis (HCC)   Lobar pneumonia (HCC)   Chronic kidney disease, stage 3b (HCC)   Hyponatremia   Essential hypertension   Closed nondisplaced fracture of left great toe   Pleural effusion Severe protein caloric malnutrition/Failure to thrive.  Brief hospital admission narrative course: JINNY SWEETLAND is a 88 y.o. female with medical history significant of metastatic right breast cancer, chronic kidney disease a stage IIIb, hyperlipidemia, hypertension, history of atrial fibrillation, chronic cirrhosis, recent admission for pneumonia and underlying dementia; who was sent from the skilled nursing facility secondary to altered mental status.  Despite his underlying dementia patient at baseline is able to communicate, follow commands and take oral medications.  For the last 24 hours she has not been able to do activities as she will normally perform at baseline and is having difficulty being redirected.  Patient was sent to the emergency department for further evaluation and management.  At the facility patient was noticed to be tachypneic; but no productive cough has been reported.  There has not been any complaints of chest pain, fever, nausea, vomiting, melena, hematochezia or any other complaints. In the ED work-up demonstrating bilateral pulmonary opacities with concern for worsening  infiltrates versus atelectasis along with right pleural effusion.  CT head without contrast showing no acute intracranial abnormalities.  Patient was found to be hypercapnic and had ammonia level was 93; TR[facility] has been consulted to place in the hospital for further evaluation and management of acute encephalopathy (due to hepatic encephalopathy and hypercapnia).  Assessment and Plan: * Acute encephalopathy -In the setting of hypercapnia and hepatic encephalopathy -Ammonia level down to 32 after providing rectal lactulose and reinitiation of oral lactulose. -Mentation has improved, even patient is still experiencing intermittent episodes of lethargy.  (Questionable sundowning). -After the use of BiPAP patient hypercapnia process has also resolved -No further BiPAP needs appreciated for 24 hours prior to be transferred to the floor. -After discussing with family decision was made to transition to full comfort care and anticipating no further use of BiPAP anyways. -Patient's condition turn for the worse with increased upper airway secretions, increasing her respiratory rate and once again very confused/lethargy or experiencing-no breathing. -Institutional for comfort care and symptomatic management was initiated -At 12:37 PM on Jan 11, 2022 patient peacefully expired. -Family was at bedside..    Pleural effusion -In the setting of malignant effusion most likely. -oxygen supplementation provided. -After discussing with family decision was made not to pursued thoracentesis or any further invasive treatment. -Patient was transition to full comfort care and peacefully expired on 01-11-2022 (12:37 PM).   Closed nondisplaced fracture of left great toe -As previously seen on recent admission; planning conservative management, fracture stabilization with her own toes.  Previously discharged with walker camboot in place in order to attempt weightbearing. -Patient was essentially bedbound at baseline. -after  comfort care initiated she peacefully expired on 2022-01-11 at 12:37 pm  Essential hypertension -  stable overall -metoprolol was continued; until patient unable to take PO meds. -her care was transitioned to full comfort/symptomatic management and peacefully expired at 12:37 on Jan 24, 2022.  Hyponatremia -Profile associated with chronic renal failure, acute lung infection and cirrhosis -sodium remain stable around 129. -once transitioned to full comfort care no further blood work pursued.  Chronic kidney disease, stage 3b (HCC) -Creatinine overall stable -Nephrotoxic agents were avoided and patient was kept hydrated. -Once transition of care to full comfort decided fluids will discontinue no further blood work anticipated to track renal function trend or instability. -patient expired at 12:37pm on January 24, 2022  Lobar pneumonia ([place]) -With recent hospitalization where she received antibiotic therapy for community-acquired pneumonia -patient completed antibiotics length of therapy from previous recommendation. -By 01-24-22 unable to take meds orally again and experiencing obtundation, agonal breathing and increase build up upper airway secretions. -comfort measures initiated, scopolamine patch applied and morphine for comfort started. -family at bedside on board with transition to comfort care and full symptomatic approach. -On 01/24/22 at 12:37 PM patient peacefully expired.  Hepatic cirrhosis (HCC) -Overall with poor prognosis. -by 01-24-22 unable to take meds orally again and experiencing obtundation, agonal breathing and increase build up upper airway secretions. -comfort measures initiated, scopolamine patch applied and morphine for comfort started. -family at bedside on board with transition to comfort care and full symptomatic approach. -On Jan 24, 2022 at 12:37 PM patient peacefully expired.    Malignant neoplasm of upper-outer quadrant of right breast in female, estrogen receptor negative ([place]) -  At this moment care has been decided for no further chemotherapy treatment. -Hospice referral was requested. -patient condition worsen and patient was made full comfort while inpatient. -at 12:37pm on 01/24/22 she peacefully expired.   Failure to thrive/severe protein calorie malnutrition -Patient was already experiencing decreased oral intake which continue worsening after she was admitted. -Decision made to pursuit enteral or parenteral artificial nutrition decided. -Patient was transition to full comfort care and peacefully expired on 24-Jan-2022.   Procedures: See below for x-ray reports.  Consultations: Palliative Care/hospice.  The results of significant diagnostics from this hospitalization (including imaging, microbiology, ancillary and laboratory) are listed below for reference.   Significant Diagnostic Studies: CT Head Wo Contrast  Result Date: 01/17/2022 CLINICAL DATA:  Altered mental status. EXAM: CT HEAD WITHOUT CONTRAST TECHNIQUE: Contiguous axial images were obtained from the base of the skull through the vertex without intravenous contrast. RADIATION DOSE REDUCTION: This exam was performed according to the departmental dose-optimization program which includes automated exposure control, adjustment of the mA and/or kV according to patient size and/or use of iterative reconstruction technique. COMPARISON:  03/21/2021. FINDINGS: Brain: No evidence of acute infarction, hemorrhage, hydrocephalus, extra-axial collection or mass lesion/mass effect. There is prominence of the sulci and ventricles compatible with age related parenchymal volume loss. There is mild diffuse low-attenuation within the subcortical and periventricular white matter compatible with chronic microvascular disease. Vascular: No hyperdense vessel or unexpected calcification. Skull: Normal. Negative for fracture or focal lesion. Sinuses/Orbits: Air-fluid levels identified within the sphenoid sinus and posterior ethmoid air  cells. The remaining paranasal sinuses and mastoid air cells are clear. Orbits are unremarkable. Other: None IMPRESSION: 1. No acute intracranial abnormalities. 2. Chronic small vessel ischemic disease and parenchymal volume loss. 3. Air-fluid levels within the sphenoid sinus and posterior ethmoid air cells which may reflect acute sinusitis. Electronically Signed   By: Kerby Moors M.D.   On: 01/11/2022 16:08   CT Angio Chest Pulmonary Embolism (PE) W or WO Contrast  Result Date: 12/24/2021 CLINICAL DATA:  Chest pain, shortness of breath EXAM: CT ANGIOGRAPHY CHEST WITH CONTRAST TECHNIQUE: Multidetector CT imaging of the chest was performed using the standard protocol during bolus administration of intravenous contrast. Multiplanar CT image reconstructions and MIPs were obtained to evaluate the vascular anatomy. RADIATION DOSE REDUCTION: This exam was performed according to the departmental dose-optimization program which includes automated exposure control, adjustment of the mA and/or kV according to patient size and/or use of iterative reconstruction technique. CONTRAST:  43m OMNIPAQUE IOHEXOL 350 MG/ML SOLN COMPARISON:  09/05/2021 FINDINGS: Cardiovascular: There is homogeneous enhancement in thoracic aorta. Coronary artery calcifications are seen. There is ectasia of main pulmonary artery measuring 3.7 cm suggesting pulmonary arterial hypertension. There are no intraluminal filling defects in central pulmonary artery branches. Evaluation of small peripheral branches, especially in the lower lung fields is limited by motion artifacts and infiltrates. Mediastinum/Nodes: There are enlarged lymph nodes in the mediastinum largest in the precarinal region measuring approximately 2.9 x 2 cm. There are enlarged lymph nodes in both hilar regions, more so on the right side. Largest of the nodes in the right hilum measures proximally 3.6 x 3 cm. There is interval increase in size and number of enlarged lymph nodes in  mediastinum and hilar regions. Lungs/Pleura: There are scattered nodular densities in both lungs largest in the left lower lobe measuring approximately 2.6 cm. There is interval increase in number and size of metastatic lesions in the lung fields. There is new linear nodular density in the medial left upper lung fields, possibly pulmonary nodules or pleural nodules from metastatic disease. Small to moderate bilateral pleural effusions are seen, more so on the right side. This finding was not seen in the previous study. There are patchy infiltrates in the lower lung fields, possibly due to atelectasis, more so on the right side. Upper Abdomen: There is fatty infiltration in the liver. Small hiatal hernia is seen. Musculoskeletal: There is 4.9 x 4.7 cm mass in the right breast which measured 4 cm in maximum diameter in the previous study suggesting possible interval progression of primary malignant neoplasm in the right breast. Review of the MIP images confirms the above findings. IMPRESSION: There is no evidence of central pulmonary artery embolism. Evaluation of small peripheral pulmonary artery branches is limited by motion artifacts and infiltrates. There is ectasia of main pulmonary artery suggesting pulmonary arterial hypertension. There is no evidence of thoracic aortic dissection. There are scattered coronary artery calcifications. There are abnormally enlarged lymph nodes in mediastinum, both hilar regions and left axilla with interval increase in size and number suggesting progression of metastatic lymphadenopathy. There are scattered nodules of varying sizes in both lungs with interval increase in number and size suggesting progression of pulmonary metastatic disease. There is interval appearance of small to moderate bilateral pleural effusions, more so on the right side suggesting possible pleural metastatic disease. There is interval increase in size of soft tissue mass in the right breast possibly  suggesting progression of primary neoplasm in the right breast. Electronically Signed   By: PElmer PickerM.D.   On: 12/24/2021 13:01   DG Chest Port 1 View  Result Date: 12/31/2021 CLINICAL DATA:  Shortness of breath, recent fall EXAM: PORTABLE CHEST 1 VIEW COMPARISON:  Portable exam 1034 hours compared to 12/23/2021 FINDINGS: RIGHT jugular Port-A-Cath with tip projecting over SVC. Enlargement of cardiac silhouette. Pulmonary vascular congestion. Atherosclerotic calcifications aorta. Increased RIGHT pleural effusion and bibasilar opacities question atelectasis versus infiltrate. Increased RIGHT perihilar opacity  suspicious for infiltrate. No pneumothorax. IMPRESSION: Enlargement of cardiac silhouette with pulmonary vascular congestion. RIGHT pleural effusion with BILATERAL pulmonary opacities question infiltrate and or atelectasis. Aortic Atherosclerosis (ICD10-I70.0). Electronically Signed   By: Lavonia Dana M.D.   On: 01/07/2022 10:46   DG Chest Port 1 View  Result Date: 12/23/2021 CLINICAL DATA:  88 year old female with history of cough. EXAM: PORTABLE CHEST 1 VIEW COMPARISON:  Chest x-ray 10/31/2021. FINDINGS: Right internal jugular single-lumen porta cath with tip terminating in the mid superior vena cava. Lung volumes are normal. Patchy ill-defined opacities and areas of interstitial prominence are noted throughout the right mid to lower lung and the left lung base. Blunting of the costophrenic sulci bilaterally, indicative of small bilateral pleural effusions. No pneumothorax. Mild cardiomegaly. Upper mediastinal contours are distorted by patient's rotation to the right. IMPRESSION: 1. Findings are concerning for multilobar bilateral bronchopneumonia, most evident throughout the right mid to lower lung and left lung base. 2. Small bilateral pleural effusions. 3. Mild cardiomegaly. Electronically Signed   By: Vinnie Langton M.D.   On: 12/23/2021 05:04   DG Toe Great Left  Result Date:  12/20/2021 CLINICAL DATA:  Injury bruising EXAM: LEFT GREAT TOE COMPARISON:  None. FINDINGS: Bones appear demineralized. Acute minimally displaced intra-articular fracture at the base of the first proximal phalanx. Positive for soft tissue swelling at the MTP joint. No subluxation IMPRESSION: Acute mildly displaced intra-articular fracture at the base of the first proximal phalanx. Electronically Signed   By: Donavan Foil M.D.   On: 12/20/2021 22:03    Microbiology: Recent Results (from the past 240 hour(s))  Urine Culture     Status: Abnormal   Collection Time: 12/20/21 11:11 PM   Specimen: Urine, Clean Catch  Result Value Ref Range Status   Specimen Description   Final    URINE, CLEAN CATCH Performed at [facility], [address], [place], [place] [address]    Special Requests   Final    NONE Performed at [facility], [address], [place], [place] [address]    Culture >=100,000 COLONIES/mL KLEBSIELLA PNEUMONIAE (A)  Final   Report Status 12/24/2021 FINAL  Final   Organism ID, Bacteria KLEBSIELLA PNEUMONIAE (A)  Final      Susceptibility   Klebsiella pneumoniae - MIC*    AMPICILLIN RESISTANT Resistant     CEFAZOLIN <=4 SENSITIVE Sensitive     CEFEPIME <=0.12 SENSITIVE Sensitive     CEFTRIAXONE <=0.25 SENSITIVE Sensitive     CIPROFLOXACIN <=0.25 SENSITIVE Sensitive     GENTAMICIN <=1 SENSITIVE Sensitive     IMIPENEM <=0.25 SENSITIVE Sensitive     NITROFURANTOIN <=16 SENSITIVE Sensitive     TRIMETH/SULFA <=20 SENSITIVE Sensitive     AMPICILLIN/SULBACTAM <=2 SENSITIVE Sensitive     PIP/TAZO <=4 SENSITIVE Sensitive     * >=100,000 COLONIES/mL KLEBSIELLA PNEUMONIAE  Resp Panel by RT-PCR (Flu A&B, Covid) Nasopharyngeal Swab     Status: None   Collection Time: 12/23/21  5:25 AM   Specimen: Nasopharyngeal Swab; Nasopharyngeal(NP) swabs in vial transport medium  Result Value Ref Range Status   SARS Coronavirus 2 by RT PCR NEGATIVE NEGATIVE Final    Comment: (NOTE) SARS-CoV-2  target nucleic acids are NOT DETECTED.  The SARS-CoV-2 RNA is generally detectable in upper respiratory specimens during the acute phase of infection. The lowest concentration of SARS-CoV-2 viral copies this assay can detect is 138 copies/mL. A negative result does not preclude SARS-Cov-2 infection and should not be used as the sole basis  for treatment or other patient management decisions. A negative result may occur with  improper specimen collection/handling, submission of specimen other than nasopharyngeal swab, presence of viral mutation(s) within the areas targeted by this assay, and inadequate number of viral copies(<138 copies/mL). A negative result must be combined with clinical observations, patient history, and epidemiological information. The expected result is Negative.  Fact Sheet for Patients:  EntrepreneurPulse.com.au  Fact Sheet for Healthcare Providers:  IncredibleEmployment.be  This test is no t yet approved or cleared by the [place] FDA and  has been authorized for detection and/or diagnosis of SARS-CoV-2 by FDA under an Emergency Use Authorization (EUA). This EUA will remain  in effect (meaning this test can be used) for the duration of the COVID-19 declaration under Section 564(b)(1) of the Act, 21 U.S.C.section 360bbb-3(b)(1), unless the authorization is terminated  or revoked sooner.       Influenza A by PCR NEGATIVE NEGATIVE Final   Influenza B by PCR NEGATIVE NEGATIVE Final    Comment: (NOTE) The Xpert Xpress SARS-CoV-2/FLU/RSV plus assay is intended as an aid in the diagnosis of influenza from Nasopharyngeal swab specimens and should not be used as a sole basis for treatment. Nasal washings and aspirates are unacceptable for Xpert Xpress SARS-CoV-2/FLU/RSV testing.  Fact Sheet for Patients: EntrepreneurPulse.com.au  Fact Sheet for Healthcare  Providers: IncredibleEmployment.be  This test is not yet approved or cleared by the [place] FDA and has been authorized for detection and/or diagnosis of SARS-CoV-2 by FDA under an Emergency Use Authorization (EUA). This EUA will remain in effect (meaning this test can be used) for the duration of the COVID-19 declaration under Section 564(b)(1) of the Act, 21 U.S.C. section 360bbb-3(b)(1), unless the authorization is terminated or revoked.  Performed at [facility], [address], [place], [place] [address]   Culture, blood (Routine x 2)     Status: None (Preliminary result)   Collection Time: 01/05/2022 11:16 AM   Specimen: Left Antecubital; Blood  Result Value Ref Range Status   Specimen Description LEFT ANTECUBITAL  Final   Special Requests   Final    BOTTLES DRAWN AEROBIC AND ANAEROBIC Blood Culture adequate volume   Culture   Final    NO GROWTH 2 DAYS Performed at [facility], [address] [place], [place] [address]    Report Status PENDING  Incomplete  Culture, blood (Routine x 2)     Status: None (Preliminary result)   Collection Time: 01/20/2022 11:16 AM   Specimen: Site Not Specified; Blood  Result Value Ref Range Status   Specimen Description SITE NOT SPECIFIED  Final   Special Requests   Final    BOTTLES DRAWN AEROBIC ONLY Blood Culture results may not be optimal due to an inadequate volume of blood received in culture bottles   Culture   Final    NO GROWTH 2 DAYS Performed at [facility], [address], [place], [place] [address]    Report Status PENDING  Incomplete  Resp Panel by RT-PCR (Flu A&B, Covid) Nasopharyngeal Swab     Status: None   Collection Time: 12/29/2021 11:41 AM   Specimen: Nasopharyngeal Swab; Nasopharyngeal(NP) swabs in vial transport medium  Result Value Ref Range Status   SARS Coronavirus 2 by RT PCR NEGATIVE NEGATIVE Final    Comment: (NOTE) SARS-CoV-2 target nucleic acids are NOT DETECTED.  The SARS-CoV-2 RNA is  generally detectable in upper respiratory specimens during the acute phase of infection. The lowest concentration of SARS-CoV-2 viral copies this  assay can detect is 138 copies/mL. A negative result does not preclude SARS-Cov-2 infection and should not be used as the sole basis for treatment or other patient management decisions. A negative result may occur with  improper specimen collection/handling, submission of specimen other than nasopharyngeal swab, presence of viral mutation(s) within the areas targeted by this assay, and inadequate number of viral copies(<138 copies/mL). A negative result must be combined with clinical observations, patient history, and epidemiological information. The expected result is Negative.  Fact Sheet for Patients:  EntrepreneurPulse.com.au  Fact Sheet for Healthcare Providers:  IncredibleEmployment.be  This test is no t yet approved or cleared by the [place] FDA and  has been authorized for detection and/or diagnosis of SARS-CoV-2 by FDA under an Emergency Use Authorization (EUA). This EUA will remain  in effect (meaning this test can be used) for the duration of the COVID-19 declaration under Section 564(b)(1) of the Act, 21 U.S.C.section 360bbb-3(b)(1), unless the authorization is terminated  or revoked sooner.       Influenza A by PCR NEGATIVE NEGATIVE Final   Influenza B by PCR NEGATIVE NEGATIVE Final    Comment: (NOTE) The Xpert Xpress SARS-CoV-2/FLU/RSV plus assay is intended as an aid in the diagnosis of influenza from Nasopharyngeal swab specimens and should not be used as a sole basis for treatment. Nasal washings and aspirates are unacceptable for Xpert Xpress SARS-CoV-2/FLU/RSV testing.  Fact Sheet for Patients: EntrepreneurPulse.com.au  Fact Sheet for Healthcare Providers: IncredibleEmployment.be  This test is not yet approved or cleared by the [place] FDA and has been authorized for detection and/or diagnosis of SARS-CoV-2 by FDA under an Emergency Use Authorization (EUA). This EUA will remain in effect (meaning this test can be used) for the duration of the COVID-19 declaration under Section 564(b)(1) of the Act, 21 U.S.C. section 360bbb-3(b)(1), unless the authorization is terminated or revoked.  Performed at [facility], [address] [place], [place] [address]   MRSA Next Gen by PCR, Nasal     Status: None   Collection Time: 01/13/2022  5:35 PM   Specimen: Nasal Mucosa; Nasal Swab  Result Value Ref Range Status   MRSA by PCR Next Gen NOT DETECTED NOT DETECTED Final    Comment: (NOTE) The GeneXpert MRSA Assay (FDA approved for NASAL specimens only), is one component of a comprehensive MRSA colonization surveillance program. It is not intended to diagnose MRSA infection nor to guide or monitor treatment for MRSA infections. Test performance is not FDA approved in patients less than 45 years old. Performed at [facility], [address], [place], [place] [address]     Time spent: 30 minutes  Signed: Barton Dubois, MD January 20, 2022

## 2022-01-26 NOTE — Progress Notes (Signed)
Pt resting in brief intervals in latter part of the shift. When awakens, pt frequently moans. Pt turned and repositioned throughout the night with oral care provided. Pt unable to safely consume any oral intake, pt will not follow direction to swallow when small amount of water place in mouth. Pt noted resting with mouth opened. Pt has not voided this shift, bladder scan performed-228 ml. On call provider-Dr. Clearence Ped notified, no new orders at this time. Received phone call from pt's husband and daughter and provided update on pt's condition.  ?

## 2022-01-26 NOTE — Plan of Care (Signed)
  Problem: Education: Goal: Knowledge of General Education information will improve Description: Including pain rating scale, medication(s)/side effects and non-pharmacologic comfort measures Outcome: Not Progressing   Problem: Health Behavior/Discharge Planning: Goal: Ability to manage health-related needs will improve Outcome: Not Progressing   

## 2022-01-26 DEATH — deceased

## 2022-03-12 ENCOUNTER — Other Ambulatory Visit (HOSPITAL_COMMUNITY): Payer: Medicare HMO

## 2022-03-19 ENCOUNTER — Ambulatory Visit (HOSPITAL_COMMUNITY): Payer: Medicare HMO | Admitting: Hematology
